# Patient Record
Sex: Female | Born: 1960 | Race: White | Hispanic: No | Marital: Married | Smoking: Never smoker
Health system: Southern US, Community
[De-identification: ages and names within clinical notes are randomized; demographics above are authoritative.]

## PROBLEM LIST (undated history)

## (undated) DIAGNOSIS — M1990 Unspecified osteoarthritis, unspecified site: Secondary | ICD-10-CM

## (undated) DIAGNOSIS — T7840XA Allergy, unspecified, initial encounter: Secondary | ICD-10-CM

## (undated) DIAGNOSIS — F32A Depression, unspecified: Secondary | ICD-10-CM

## (undated) DIAGNOSIS — G4700 Insomnia, unspecified: Secondary | ICD-10-CM

## (undated) DIAGNOSIS — N882 Stricture and stenosis of cervix uteri: Secondary | ICD-10-CM

## (undated) DIAGNOSIS — F329 Major depressive disorder, single episode, unspecified: Secondary | ICD-10-CM

## (undated) DIAGNOSIS — R450 Nervousness: Secondary | ICD-10-CM

## (undated) DIAGNOSIS — D1800 Hemangioma unspecified site: Secondary | ICD-10-CM

## (undated) DIAGNOSIS — E785 Hyperlipidemia, unspecified: Secondary | ICD-10-CM

## (undated) DIAGNOSIS — F419 Anxiety disorder, unspecified: Secondary | ICD-10-CM

## (undated) DIAGNOSIS — R87619 Unspecified abnormal cytological findings in specimens from cervix uteri: Secondary | ICD-10-CM

## (undated) DIAGNOSIS — C50919 Malignant neoplasm of unspecified site of unspecified female breast: Secondary | ICD-10-CM

## (undated) DIAGNOSIS — R42 Dizziness and giddiness: Secondary | ICD-10-CM

## (undated) HISTORY — DX: Unspecified abnormal cytological findings in specimens from cervix uteri: R87.619

## (undated) HISTORY — DX: Insomnia, unspecified: G47.00

## (undated) HISTORY — PX: COLONOSCOPY: SHX174

## (undated) HISTORY — PX: TUBAL LIGATION: SHX77

## (undated) HISTORY — DX: Unspecified osteoarthritis, unspecified site: M19.90

## (undated) HISTORY — DX: Dizziness and giddiness: R42

## (undated) HISTORY — DX: Hyperlipidemia, unspecified: E78.5

## (undated) HISTORY — PX: BREAST REDUCTION SURGERY: SHX8

## (undated) HISTORY — DX: Nervousness: R45.0

## (undated) HISTORY — DX: Hemangioma unspecified site: D18.00

## (undated) HISTORY — DX: Stricture and stenosis of cervix uteri: N88.2

## (undated) HISTORY — PX: OTHER SURGICAL HISTORY: SHX169

---

## 1985-03-10 DIAGNOSIS — R450 Nervousness: Secondary | ICD-10-CM

## 1985-03-10 HISTORY — DX: Nervousness: R45.0

## 1989-03-10 DIAGNOSIS — R42 Dizziness and giddiness: Secondary | ICD-10-CM

## 1989-03-10 HISTORY — DX: Dizziness and giddiness: R42

## 1995-03-11 DIAGNOSIS — G4700 Insomnia, unspecified: Secondary | ICD-10-CM

## 1995-03-11 HISTORY — DX: Insomnia, unspecified: G47.00

## 1998-02-27 ENCOUNTER — Encounter: Admission: RE | Admit: 1998-02-27 | Discharge: 1998-05-28 | Payer: Self-pay | Admitting: *Deleted

## 1998-06-25 ENCOUNTER — Other Ambulatory Visit: Admission: RE | Admit: 1998-06-25 | Discharge: 1998-06-25 | Payer: Self-pay | Admitting: Obstetrics & Gynecology

## 1999-02-01 ENCOUNTER — Encounter: Admission: RE | Admit: 1999-02-01 | Discharge: 1999-02-01 | Payer: Self-pay | Admitting: Obstetrics & Gynecology

## 1999-02-01 ENCOUNTER — Encounter: Payer: Self-pay | Admitting: Obstetrics and Gynecology

## 1999-06-24 ENCOUNTER — Ambulatory Visit (HOSPITAL_COMMUNITY): Admission: RE | Admit: 1999-06-24 | Discharge: 1999-06-24 | Payer: Self-pay | Admitting: Family Medicine

## 1999-06-24 ENCOUNTER — Encounter: Payer: Self-pay | Admitting: Family Medicine

## 1999-07-22 ENCOUNTER — Other Ambulatory Visit: Admission: RE | Admit: 1999-07-22 | Discharge: 1999-07-22 | Payer: Self-pay | Admitting: Obstetrics and Gynecology

## 2000-03-10 DIAGNOSIS — N882 Stricture and stenosis of cervix uteri: Secondary | ICD-10-CM

## 2000-03-10 DIAGNOSIS — R87619 Unspecified abnormal cytological findings in specimens from cervix uteri: Secondary | ICD-10-CM

## 2000-03-10 HISTORY — DX: Stricture and stenosis of cervix uteri: N88.2

## 2000-03-10 HISTORY — DX: Unspecified abnormal cytological findings in specimens from cervix uteri: R87.619

## 2000-04-24 ENCOUNTER — Ambulatory Visit (HOSPITAL_COMMUNITY): Admission: RE | Admit: 2000-04-24 | Discharge: 2000-04-24 | Payer: Self-pay | Admitting: Neurology

## 2000-04-24 ENCOUNTER — Encounter: Payer: Self-pay | Admitting: Neurology

## 2000-09-09 ENCOUNTER — Other Ambulatory Visit: Admission: RE | Admit: 2000-09-09 | Discharge: 2000-09-09 | Payer: Self-pay | Admitting: Obstetrics and Gynecology

## 2000-09-25 ENCOUNTER — Encounter (INDEPENDENT_AMBULATORY_CARE_PROVIDER_SITE_OTHER): Payer: Self-pay

## 2000-09-25 ENCOUNTER — Other Ambulatory Visit: Admission: RE | Admit: 2000-09-25 | Discharge: 2000-09-25 | Payer: Self-pay | Admitting: Obstetrics and Gynecology

## 2000-10-01 ENCOUNTER — Ambulatory Visit (HOSPITAL_COMMUNITY): Admission: RE | Admit: 2000-10-01 | Discharge: 2000-10-01 | Payer: Self-pay | Admitting: Obstetrics and Gynecology

## 2000-10-01 ENCOUNTER — Encounter (INDEPENDENT_AMBULATORY_CARE_PROVIDER_SITE_OTHER): Payer: Self-pay | Admitting: Specialist

## 2001-04-06 ENCOUNTER — Other Ambulatory Visit: Admission: RE | Admit: 2001-04-06 | Discharge: 2001-04-06 | Payer: Self-pay | Admitting: Obstetrics and Gynecology

## 2001-06-18 ENCOUNTER — Encounter: Admission: RE | Admit: 2001-06-18 | Discharge: 2001-09-16 | Payer: Self-pay | Admitting: Neurology

## 2001-09-13 ENCOUNTER — Other Ambulatory Visit: Admission: RE | Admit: 2001-09-13 | Discharge: 2001-09-13 | Payer: Self-pay | Admitting: Obstetrics and Gynecology

## 2002-03-11 ENCOUNTER — Other Ambulatory Visit: Admission: RE | Admit: 2002-03-11 | Discharge: 2002-03-11 | Payer: Self-pay | Admitting: Obstetrics and Gynecology

## 2002-09-15 ENCOUNTER — Other Ambulatory Visit: Admission: RE | Admit: 2002-09-15 | Discharge: 2002-09-15 | Payer: Self-pay | Admitting: Obstetrics and Gynecology

## 2003-01-13 ENCOUNTER — Encounter: Admission: RE | Admit: 2003-01-13 | Discharge: 2003-01-13 | Payer: Self-pay | Admitting: Obstetrics and Gynecology

## 2003-03-20 ENCOUNTER — Other Ambulatory Visit: Admission: RE | Admit: 2003-03-20 | Discharge: 2003-03-20 | Payer: Self-pay | Admitting: Obstetrics and Gynecology

## 2003-11-20 ENCOUNTER — Other Ambulatory Visit: Admission: RE | Admit: 2003-11-20 | Discharge: 2003-11-20 | Payer: Self-pay | Admitting: Obstetrics and Gynecology

## 2004-03-19 ENCOUNTER — Inpatient Hospital Stay (HOSPITAL_COMMUNITY): Admission: AD | Admit: 2004-03-19 | Discharge: 2004-03-19 | Payer: Self-pay | Admitting: Obstetrics and Gynecology

## 2004-03-25 ENCOUNTER — Ambulatory Visit (HOSPITAL_COMMUNITY): Admission: RE | Admit: 2004-03-25 | Discharge: 2004-03-25 | Payer: Self-pay | Admitting: Obstetrics and Gynecology

## 2004-05-07 ENCOUNTER — Inpatient Hospital Stay (HOSPITAL_COMMUNITY): Admission: AD | Admit: 2004-05-07 | Discharge: 2004-05-10 | Payer: Self-pay | Admitting: Obstetrics and Gynecology

## 2004-05-08 ENCOUNTER — Encounter (INDEPENDENT_AMBULATORY_CARE_PROVIDER_SITE_OTHER): Payer: Self-pay | Admitting: *Deleted

## 2004-05-08 HISTORY — PX: OTHER SURGICAL HISTORY: SHX169

## 2004-06-18 ENCOUNTER — Other Ambulatory Visit: Admission: RE | Admit: 2004-06-18 | Discharge: 2004-06-18 | Payer: Self-pay | Admitting: Obstetrics and Gynecology

## 2004-08-21 ENCOUNTER — Encounter: Admission: RE | Admit: 2004-08-21 | Discharge: 2004-08-21 | Payer: Self-pay | Admitting: Obstetrics and Gynecology

## 2005-03-10 DIAGNOSIS — E785 Hyperlipidemia, unspecified: Secondary | ICD-10-CM

## 2005-03-10 HISTORY — DX: Hyperlipidemia, unspecified: E78.5

## 2005-06-19 ENCOUNTER — Other Ambulatory Visit: Admission: RE | Admit: 2005-06-19 | Discharge: 2005-06-19 | Payer: Self-pay | Admitting: Obstetrics and Gynecology

## 2005-09-05 ENCOUNTER — Encounter: Admission: RE | Admit: 2005-09-05 | Discharge: 2005-09-05 | Payer: Self-pay | Admitting: Obstetrics and Gynecology

## 2006-02-07 HISTORY — PX: OTHER SURGICAL HISTORY: SHX169

## 2006-03-04 ENCOUNTER — Ambulatory Visit (HOSPITAL_COMMUNITY): Admission: RE | Admit: 2006-03-04 | Discharge: 2006-03-04 | Payer: Self-pay | Admitting: Obstetrics and Gynecology

## 2006-10-09 ENCOUNTER — Encounter: Admission: RE | Admit: 2006-10-09 | Discharge: 2006-10-09 | Payer: Self-pay | Admitting: Obstetrics and Gynecology

## 2007-11-19 ENCOUNTER — Encounter: Admission: RE | Admit: 2007-11-19 | Discharge: 2007-11-19 | Payer: Self-pay | Admitting: Obstetrics and Gynecology

## 2007-11-22 ENCOUNTER — Encounter: Admission: RE | Admit: 2007-11-22 | Discharge: 2007-11-22 | Payer: Self-pay | Admitting: Obstetrics and Gynecology

## 2008-03-15 ENCOUNTER — Emergency Department (HOSPITAL_COMMUNITY): Admission: EM | Admit: 2008-03-15 | Discharge: 2008-03-15 | Payer: Self-pay | Admitting: Emergency Medicine

## 2008-04-29 ENCOUNTER — Ambulatory Visit: Payer: Self-pay | Admitting: Cardiology

## 2008-05-15 ENCOUNTER — Encounter: Admission: RE | Admit: 2008-05-15 | Discharge: 2008-08-13 | Payer: Self-pay | Admitting: Neurology

## 2009-01-19 ENCOUNTER — Encounter: Admission: RE | Admit: 2009-01-19 | Discharge: 2009-01-19 | Payer: Self-pay | Admitting: Obstetrics and Gynecology

## 2010-01-22 ENCOUNTER — Encounter: Admission: RE | Admit: 2010-01-22 | Discharge: 2010-01-22 | Payer: Self-pay | Admitting: Obstetrics and Gynecology

## 2010-03-10 DIAGNOSIS — T7840XA Allergy, unspecified, initial encounter: Secondary | ICD-10-CM

## 2010-03-10 HISTORY — DX: Allergy, unspecified, initial encounter: T78.40XA

## 2010-04-01 ENCOUNTER — Encounter: Payer: Self-pay | Admitting: Obstetrics and Gynecology

## 2010-05-23 ENCOUNTER — Other Ambulatory Visit: Payer: Self-pay | Admitting: Dermatology

## 2010-06-24 LAB — POCT I-STAT, CHEM 8
BUN: 14 mg/dL (ref 6–23)
Calcium, Ion: 1.1 mmol/L — ABNORMAL LOW (ref 1.12–1.32)
Creatinine, Ser: 0.6 mg/dL (ref 0.4–1.2)
Glucose, Bld: 87 mg/dL (ref 70–99)
TCO2: 25 mmol/L (ref 0–100)

## 2010-07-12 ENCOUNTER — Encounter: Payer: Self-pay | Admitting: Family Medicine

## 2010-07-26 NOTE — Op Note (Signed)
NAMEKENEDIE, DIROCCO              ACCOUNT NO.:  [id number]   MEDICAL RECORD NO.:  [id number]          PATIENT TYPE:  AMB   LOCATION:  SDC                           FACILITY:  [facility]   PHYSICIAN:  Zenaida Niece, M.D.DATE OF BIRTH:  [birth year]   DATE OF PROCEDURE:  03/04/2006  DATE OF DISCHARGE:                               OPERATIVE REPORT   PREOPERATIVE DIAGNOSES:  Menorrhagia.   POSTOPERATIVE DIAGNOSES:  Menorrhagia.   PROCEDURE PERFORMED:  NovaSure endometrial ablation.   SURGEON:  Zenaida Niece, M.D.   ANESTHESIA:  General anesthesia with an LMA and paracervical block.   SPECIMENS:  None.   ESTIMATED BLOOD LOSS:  Minimal.   COMPLICATIONS:  None.   FINDINGS:  The uterus measured to 8 cm and cervix measured to 4 cm,  giving a uterine depth of 4 cm and the uterine width was 4.5 cm with the  NovaSure device.  The device used 90 watts of power for 107 seconds.   DESCRIPTION OF PROCEDURE:  The patient was taken to the operating room  and placed in the dorsal supine position.  General anesthesia was  induced and she was placed in mobile stirrups.  The perineum and vagina  were then prepped and draped in the usual sterile fashion and the  bladder drained with a red rubber catheter.  A Graves speculum was  inserted into the vagina and 2 mL of 2% plain lidocaine were infiltrated  at 12 o'clock in the cervix for the single-tooth tenaculum.  Preoperatively she was given IV Toradol.  The anterior lip of the cervix  was then grasped with a single-tooth tenaculum.  A deep paracervical  block was then performed with a total of 16 mL of 2% plain lidocaine.  The uterus then sounded to 8 cm and then with a size seven Hegar  dilator, the cervix measured 4 cm.  The cervix easily dilated to  accommodate the size seven and then the size eight Hegar dilator.  The  NovaSure device was then easily inserted and deployed appropriately.  The CO2 test was passed and endometrial ablation was  performed with the  above-mentioned settings without complications.  At the end of procedure  the device was removed and found to be intact.  The single-tooth  tenaculum was removed and bleeding controlled with pressure.  All  instruments were then removed from the vagina.  The patient tolerated  the procedure well and was taken to the recovery room in stable  condition.  Counts were correct.     Zenaida Niece, M.D.  Electronically Signed    TDM/MEDQ  D:  03/04/2006  T:  03/04/2006  Job:  [id number]

## 2010-07-26 NOTE — Op Note (Signed)
[name], ARCINIEGA NO.:  [id number]   MEDICAL RECORD NO.:  [id number]          PATIENT TYPE:  INP   LOCATION:  9124                          FACILITY:  [facility]   PHYSICIAN:  Zenaida Niece, M.D.DATE OF BIRTH:  1960/05/10   DATE OF PROCEDURE:  05/08/2004  DATE OF DISCHARGE:                                 OPERATIVE REPORT   PREOPERATIVE DIAGNOSIS:  Multiparity and desires surgical sterility.   POSTOPERATIVE DIAGNOSES:  Multiparity and desires surgical sterility.   PROCEDURES:  Bilateral partial salpingectomy.   SURGEON:  Zenaida Niece, M.D.   ANESTHESIA:  Epidural.   ESTIMATED BLOOD LOSS:  Less than 50 mL.   FINDINGS:  The patient had normal anatomy.   SPECIMENS:  Portions of bilateral fallopian tubes.   PROCEDURE IN DETAIL:  The patient was taken to the operating room and placed  in the dorsosupine position. Her previously placed epidural was dosed  appropriately. Abdomen was prepped and draped in the usual sterile fashion.  Her infraumbilical skin was then infiltrated with 0.25% Marcaine once level  of her anesthesia was adequate. A 3 cm horizontal incision was made and  carried down to the fascia. Fascia was elevated and entered sharply.  Peritoneum was then elevated and also entered sharply. The patient was  placed in Trendelenburg and bowels packed above the uterus. Both fallopian  tubes were identified and traced to their fimbriated ends. A knuckle of tube  on each side was elevated with a Babcock clamp. This knuckle of tube was  ligated with zero plain gut suture on both sides. The knuckle of tube was  then removed sharply. On both sides, both ostia were identified and the  stumps were hemostatic. The packing was then removed from the abdomen. The  fascia was closed with running 0 Vicryl. Skin was closed with a running  subcuticular suture of 4-0 Vicryl followed by bandage. The patient tolerated  the procedure well and was taken to recovery in  stable condition. Counts  were correct.      TDM/MEDQ  D:  05/08/2004  T:  05/08/2004  Job:  [id number]
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
## 2010-12-31 ENCOUNTER — Other Ambulatory Visit: Payer: Self-pay | Admitting: Obstetrics and Gynecology

## 2010-12-31 DIAGNOSIS — Z1231 Encounter for screening mammogram for malignant neoplasm of breast: Secondary | ICD-10-CM

## 2011-01-28 ENCOUNTER — Ambulatory Visit
Admission: RE | Admit: 2011-01-28 | Discharge: 2011-01-28 | Disposition: A | Discharge status: Home / Self Care | Payer: BC Managed Care – PPO | Source: Ambulatory Visit | Attending: Obstetrics and Gynecology | Admitting: Obstetrics and Gynecology

## 2011-01-28 DIAGNOSIS — Z1231 Encounter for screening mammogram for malignant neoplasm of breast: Secondary | ICD-10-CM

## 2011-01-31 ENCOUNTER — Other Ambulatory Visit: Payer: Self-pay | Admitting: Obstetrics and Gynecology

## 2011-01-31 DIAGNOSIS — R928 Other abnormal and inconclusive findings on diagnostic imaging of breast: Secondary | ICD-10-CM

## 2011-02-03 ENCOUNTER — Ambulatory Visit
Admission: RE | Admit: 2011-02-03 | Discharge: 2011-02-03 | Disposition: A | Discharge status: Home / Self Care | Payer: BC Managed Care – PPO | Source: Ambulatory Visit | Attending: Obstetrics and Gynecology | Admitting: Obstetrics and Gynecology

## 2011-02-03 ENCOUNTER — Other Ambulatory Visit: Payer: Self-pay | Admitting: Obstetrics and Gynecology

## 2011-02-03 DIAGNOSIS — R928 Other abnormal and inconclusive findings on diagnostic imaging of breast: Secondary | ICD-10-CM

## 2011-02-03 DIAGNOSIS — N6001 Solitary cyst of right breast: Secondary | ICD-10-CM

## 2011-06-23 ENCOUNTER — Ambulatory Visit: Payer: BC Managed Care – PPO | Admitting: Physical Therapy

## 2011-06-26 ENCOUNTER — Ambulatory Visit: Payer: BC Managed Care – PPO | Admitting: Physical Therapy

## 2011-06-27 ENCOUNTER — Encounter: Payer: BC Managed Care – PPO | Admitting: *Deleted

## 2011-06-30 ENCOUNTER — Ambulatory Visit: Payer: BC Managed Care – PPO | Attending: Orthopedic Surgery | Admitting: Physical Therapy

## 2011-06-30 DIAGNOSIS — R5381 Other malaise: Secondary | ICD-10-CM | POA: Insufficient documentation

## 2011-06-30 DIAGNOSIS — IMO0001 Reserved for inherently not codable concepts without codable children: Secondary | ICD-10-CM | POA: Insufficient documentation

## 2011-06-30 DIAGNOSIS — M25519 Pain in unspecified shoulder: Secondary | ICD-10-CM | POA: Insufficient documentation

## 2012-02-16 ENCOUNTER — Other Ambulatory Visit: Payer: Self-pay | Admitting: Obstetrics and Gynecology

## 2012-02-16 DIAGNOSIS — Z1231 Encounter for screening mammogram for malignant neoplasm of breast: Secondary | ICD-10-CM

## 2012-02-18 ENCOUNTER — Ambulatory Visit: Payer: BC Managed Care – PPO

## 2012-02-24 ENCOUNTER — Ambulatory Visit
Admission: RE | Admit: 2012-02-24 | Discharge: 2012-02-24 | Disposition: A | Discharge status: Home / Self Care | Payer: PRIVATE HEALTH INSURANCE | Source: Ambulatory Visit | Attending: Obstetrics and Gynecology | Admitting: Obstetrics and Gynecology

## 2012-02-24 DIAGNOSIS — Z1231 Encounter for screening mammogram for malignant neoplasm of breast: Secondary | ICD-10-CM

## 2012-04-28 ENCOUNTER — Other Ambulatory Visit: Payer: Self-pay | Admitting: Orthopedic Surgery

## 2012-04-28 DIAGNOSIS — M25511 Pain in right shoulder: Secondary | ICD-10-CM

## 2012-04-29 ENCOUNTER — Ambulatory Visit
Admission: RE | Admit: 2012-04-29 | Discharge: 2012-04-29 | Disposition: A | Discharge status: Home / Self Care | Payer: PRIVATE HEALTH INSURANCE | Source: Ambulatory Visit | Attending: Orthopedic Surgery | Admitting: Orthopedic Surgery

## 2012-04-29 DIAGNOSIS — M25511 Pain in right shoulder: Secondary | ICD-10-CM

## 2012-06-18 ENCOUNTER — Other Ambulatory Visit: Payer: Self-pay | Admitting: Family Medicine

## 2012-06-18 NOTE — Telephone Encounter (Signed)
Patient last seen in office on 10-01-11. Please advise. Thank you

## 2012-07-06 ENCOUNTER — Other Ambulatory Visit: Payer: Self-pay | Admitting: *Deleted

## 2012-07-06 MED ORDER — MECLIZINE HCL 12.5 MG PO TABS: 12.5000 mg | ORAL_TABLET | Freq: Three times a day (TID) | ORAL | Status: DC | PRN

## 2012-07-06 NOTE — Telephone Encounter (Signed)
Patient last seen in office on 10-01-11. Please advise. Thank you 

## 2012-07-20 ENCOUNTER — Encounter (HOSPITAL_COMMUNITY): Payer: Self-pay | Admitting: Pharmacy Technician

## 2012-07-21 NOTE — Progress Notes (Signed)
Dr Darrelyn Hillock-  Need PRE OP ORDERS PLEASE-  Has PST appt 07/22/12  Thanks

## 2012-07-22 ENCOUNTER — Other Ambulatory Visit (HOSPITAL_COMMUNITY): Payer: Self-pay | Admitting: *Deleted

## 2012-07-22 ENCOUNTER — Inpatient Hospital Stay (HOSPITAL_COMMUNITY): Admission: RE | Admit: 2012-07-22 | Payer: PRIVATE HEALTH INSURANCE | Source: Ambulatory Visit

## 2012-07-23 ENCOUNTER — Other Ambulatory Visit: Payer: Self-pay | Admitting: Family Medicine

## 2012-07-23 ENCOUNTER — Inpatient Hospital Stay (HOSPITAL_COMMUNITY): Admission: RE | Admit: 2012-07-23 | Payer: PRIVATE HEALTH INSURANCE | Source: Ambulatory Visit

## 2012-07-23 NOTE — Patient Instructions (Addendum)
20 MOE BRIER  07/23/2012   Your procedure is scheduled on: 07-28-2012  Report to [facility] at 845 AM.  Call this number if you have problems the morning of surgery [phone]   Remember:   Do not eat food or drink liquids :After Midnight.   Take these medicines the morning of surgery with A SIP OF WATER: clonazepam                                SEE [place] PREPARING FOR SURGERY SHEET   Do not wear jewelry, make-up or nail polish.  Do not wear lotions, powders, or perfumes. You may wear deodorant.   Men may shave face and neck.  Do not bring valuables to the hospital.  Contacts, dentures or bridgework may not be worn into surgery.  Leave suitcase in the car. After surgery it may be brought to your room.  For patients admitted to the hospital, checkout time is 11:00 AM the day of discharge.   Patients discharged the day of surgery will not be allowed to drive home.  Name and phone number of your driver:  Special Instructions: N/A   Please read over the following fact sheets that you were given: MRSA Information.  Call Cain Sieve RN pre op nurse if needed [phone]    FAILURE TO FOLLOW THESE INSTRUCTIONS MAY RESULT IN THE CANCELLATION OF YOUR SURGERY. PATIENT SIGNATURE___________________________________________
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
## 2012-07-26 ENCOUNTER — Encounter (HOSPITAL_COMMUNITY)
Admission: RE | Admit: 2012-07-26 | Discharge: 2012-07-26 | Disposition: A | Discharge status: Home / Self Care | Payer: PRIVATE HEALTH INSURANCE | Source: Ambulatory Visit | Attending: Orthopedic Surgery | Admitting: Orthopedic Surgery

## 2012-07-26 ENCOUNTER — Encounter (HOSPITAL_COMMUNITY): Payer: Self-pay

## 2012-07-26 LAB — COMPREHENSIVE METABOLIC PANEL
ALT: 25 U/L (ref 0–35)
Albumin: 3.7 g/dL (ref 3.5–5.2)
Alkaline Phosphatase: 42 U/L (ref 39–117)
BUN: 12 mg/dL (ref 6–23)
Chloride: 102 mEq/L (ref 96–112)
GFR calc Af Amer: >90 mL/min (ref >90)
Glucose, Bld: 79 mg/dL (ref 70–99)
Potassium: 3.5 mEq/L (ref 3.5–5.1)
Sodium: 141 mEq/L (ref 135–145)
Total Bilirubin: 0.4 mg/dL (ref 0.3–1.2)
Total Protein: 6.5 g/dL (ref 6.0–8.3)

## 2012-07-26 LAB — PROTIME-INR: Prothrombin Time: 12.1 seconds (ref 11.6–15.2)

## 2012-07-26 LAB — SURGICAL PCR SCREEN
MRSA, PCR: NEGATIVE
Staphylococcus aureus: POSITIVE — AB

## 2012-07-26 LAB — CBC
HCT: 40.4 % (ref 36.0–46.0)
Hemoglobin: 13.2 g/dL (ref 12.0–15.0)
WBC: 11.3 10*3/uL — ABNORMAL HIGH (ref 4.0–10.5)

## 2012-07-26 LAB — URINALYSIS, ROUTINE W REFLEX MICROSCOPIC
Glucose, UA: NEGATIVE mg/dL
Hgb urine dipstick: NEGATIVE
Leukocytes, UA: NEGATIVE
Protein, ur: NEGATIVE mg/dL
pH: 7.5 (ref 5.0–8.0)

## 2012-07-26 LAB — HCG, SERUM, QUALITATIVE: Preg, Serum: NEGATIVE

## 2012-07-26 NOTE — Telephone Encounter (Signed)
Per our records last filled 12/29/11, last 10/01/11

## 2012-07-28 ENCOUNTER — Encounter (HOSPITAL_COMMUNITY)
Admission: RE | Disposition: A | Discharge status: Home / Self Care | Payer: Self-pay | Source: Ambulatory Visit | Attending: Orthopedic Surgery

## 2012-07-28 ENCOUNTER — Observation Stay (HOSPITAL_COMMUNITY)
Admission: RE | Admit: 2012-07-28 | Discharge: 2012-07-29 | Disposition: A | Discharge status: Home / Self Care | Payer: PRIVATE HEALTH INSURANCE | Source: Ambulatory Visit | Attending: Orthopedic Surgery | Admitting: Orthopedic Surgery

## 2012-07-28 ENCOUNTER — Encounter (HOSPITAL_COMMUNITY): Payer: Self-pay | Admitting: *Deleted

## 2012-07-28 ENCOUNTER — Ambulatory Visit (HOSPITAL_COMMUNITY): Payer: PRIVATE HEALTH INSURANCE | Admitting: Anesthesiology

## 2012-07-28 ENCOUNTER — Encounter (HOSPITAL_COMMUNITY): Payer: Self-pay | Admitting: Anesthesiology

## 2012-07-28 DIAGNOSIS — E785 Hyperlipidemia, unspecified: Secondary | ICD-10-CM | POA: Insufficient documentation

## 2012-07-28 DIAGNOSIS — M719 Bursopathy, unspecified: Secondary | ICD-10-CM | POA: Insufficient documentation

## 2012-07-28 DIAGNOSIS — S43429A Sprain of unspecified rotator cuff capsule, initial encounter: Secondary | ICD-10-CM

## 2012-07-28 DIAGNOSIS — Z79899 Other long term (current) drug therapy: Secondary | ICD-10-CM | POA: Insufficient documentation

## 2012-07-28 DIAGNOSIS — Z9889 Other specified postprocedural states: Secondary | ICD-10-CM

## 2012-07-28 DIAGNOSIS — Z01810 Encounter for preprocedural cardiovascular examination: Secondary | ICD-10-CM | POA: Insufficient documentation

## 2012-07-28 DIAGNOSIS — M25819 Other specified joint disorders, unspecified shoulder: Secondary | ICD-10-CM | POA: Insufficient documentation

## 2012-07-28 DIAGNOSIS — M7530 Calcific tendinitis of unspecified shoulder: Principal | ICD-10-CM | POA: Insufficient documentation

## 2012-07-28 DIAGNOSIS — Z01812 Encounter for preprocedural laboratory examination: Secondary | ICD-10-CM | POA: Insufficient documentation

## 2012-07-28 DIAGNOSIS — M67919 Unspecified disorder of synovium and tendon, unspecified shoulder: Secondary | ICD-10-CM | POA: Insufficient documentation

## 2012-07-28 HISTORY — PX: SHOULDER OPEN ROTATOR CUFF REPAIR: SHX2407

## 2012-07-28 SURGERY — REPAIR, ROTATOR CUFF, OPEN
Anesthesia: General | Site: Shoulder | Laterality: Right | Wound class: Clean

## 2012-07-28 MED ORDER — ONDANSETRON HCL 4 MG/2ML IJ SOLN
INTRAMUSCULAR | Status: DC | PRN
  Administered 2012-07-28: 4 mg via INTRAVENOUS
  Administered 2012-07-28: 4 mg

## 2012-07-28 MED ORDER — LACTATED RINGERS IV SOLN
INTRAVENOUS | Status: DC
  Administered 2012-07-28 – 2012-07-29 (×2): via INTRAVENOUS

## 2012-07-28 MED ORDER — SUCCINYLCHOLINE CHLORIDE 20 MG/ML IJ SOLN
INTRAMUSCULAR | Status: DC | PRN
  Administered 2012-07-28: 100 mg via INTRAVENOUS

## 2012-07-28 MED ORDER — SODIUM CHLORIDE 0.9 % IR SOLN
Status: DC | PRN
  Administered 2012-07-28: 12:00:00

## 2012-07-28 MED ORDER — LIDOCAINE HCL (CARDIAC) 20 MG/ML IV SOLN
INTRAVENOUS | Status: DC | PRN
  Administered 2012-07-28: 50 mg via INTRAVENOUS

## 2012-07-28 MED ORDER — CEFAZOLIN SODIUM 1-5 GM-% IV SOLN
1.0000 g | Freq: Four times a day (QID) | INTRAVENOUS | Status: DC
  Administered 2012-07-28 – 2012-07-29 (×2): 1 g via INTRAVENOUS
  Filled 2012-07-28 (×3): qty 50

## 2012-07-28 MED ORDER — KETOROLAC TROMETHAMINE 30 MG/ML IJ SOLN
INTRAMUSCULAR | Status: DC | PRN
  Administered 2012-07-28: 30 mg via INTRAVENOUS

## 2012-07-28 MED ORDER — CLONAZEPAM 0.5 MG PO TABS: 0.5000 mg | ORAL_TABLET | Freq: Every day | ORAL | Status: DC

## 2012-07-28 MED ORDER — ACETAMINOPHEN 10 MG/ML IV SOLN
INTRAVENOUS | Status: DC | PRN
  Administered 2012-07-28: 1000 mg via INTRAVENOUS

## 2012-07-28 MED ORDER — ONDANSETRON HCL 4 MG PO TABS: 4.0000 mg | ORAL_TABLET | Freq: Four times a day (QID) | ORAL | Status: DC | PRN

## 2012-07-28 MED ORDER — FLEET ENEMA 7-19 GM/118ML RE ENEM: 1.0000 | ENEMA | Freq: Once | RECTAL | Status: AC | PRN

## 2012-07-28 MED ORDER — LACTATED RINGERS IV SOLN: INTRAVENOUS | Status: DC

## 2012-07-28 MED ORDER — OXYCODONE-ACETAMINOPHEN 5-325 MG PO TABS
1.0000 | ORAL_TABLET | ORAL | Status: DC | PRN
  Administered 2012-07-29: 2 via ORAL
  Filled 2012-07-28: qty 2

## 2012-07-28 MED ORDER — PROMETHAZINE HCL 25 MG/ML IJ SOLN: 6.2500 mg | INTRAMUSCULAR | Status: DC | PRN

## 2012-07-28 MED ORDER — PHENOL 1.4 % MT LIQD: 1.0000 | OROMUCOSAL | Status: DC | PRN

## 2012-07-28 MED ORDER — HYDROMORPHONE HCL PF 1 MG/ML IJ SOLN
0.2500 mg | INTRAMUSCULAR | Status: DC | PRN
  Administered 2012-07-28 (×2): 0.5 mg via INTRAVENOUS

## 2012-07-28 MED ORDER — MIDAZOLAM HCL 5 MG/5ML IJ SOLN
INTRAMUSCULAR | Status: DC | PRN
  Administered 2012-07-28: 2 mg via INTRAVENOUS

## 2012-07-28 MED ORDER — PAROXETINE HCL ER 25 MG PO TB24
25.0000 mg | ORAL_TABLET | Freq: Every day | ORAL | Status: DC
  Filled 2012-07-28: qty 1

## 2012-07-28 MED ORDER — THROMBIN 5000 UNITS EX SOLR
CUTANEOUS | Status: DC | PRN
  Administered 2012-07-28: 10000 [IU] via TOPICAL

## 2012-07-28 MED ORDER — ONDANSETRON HCL 4 MG/2ML IJ SOLN
4.0000 mg | Freq: Four times a day (QID) | INTRAMUSCULAR | Status: DC | PRN
  Filled 2012-07-28: qty 2

## 2012-07-28 MED ORDER — BISACODYL 10 MG RE SUPP: 10.0000 mg | Freq: Every day | RECTAL | Status: DC | PRN

## 2012-07-28 MED ORDER — METOCLOPRAMIDE HCL 5 MG/ML IJ SOLN
INTRAMUSCULAR | Status: DC | PRN
  Administered 2012-07-28: 10 mg via INTRAVENOUS

## 2012-07-28 MED ORDER — SUFENTANIL CITRATE 50 MCG/ML IV SOLN
INTRAVENOUS | Status: DC | PRN
  Administered 2012-07-28: 10 ug via INTRAVENOUS
  Administered 2012-07-28 (×3): 5 ug via INTRAVENOUS
  Administered 2012-07-28 (×2): 10 ug via INTRAVENOUS
  Administered 2012-07-28: 5 ug via INTRAVENOUS

## 2012-07-28 MED ORDER — MENTHOL 3 MG MT LOZG: 1.0000 | LOZENGE | OROMUCOSAL | Status: DC | PRN

## 2012-07-28 MED ORDER — HYDROCODONE-ACETAMINOPHEN 5-325 MG PO TABS
1.0000 | ORAL_TABLET | ORAL | Status: DC | PRN
  Administered 2012-07-28 – 2012-07-29 (×3): 2 via ORAL
  Filled 2012-07-28 (×3): qty 2

## 2012-07-28 MED ORDER — MUPIROCIN 2 % EX OINT
TOPICAL_OINTMENT | Freq: Two times a day (BID) | CUTANEOUS | Status: DC
  Administered 2012-07-28: via NASAL
  Filled 2012-07-28: qty 22

## 2012-07-28 MED ORDER — METHOCARBAMOL 100 MG/ML IJ SOLN
500.0000 mg | Freq: Four times a day (QID) | INTRAVENOUS | Status: DC | PRN
  Filled 2012-07-28: qty 5

## 2012-07-28 MED ORDER — CEFAZOLIN SODIUM-DEXTROSE 2-3 GM-% IV SOLR: 2.0000 g | INTRAVENOUS | Status: DC

## 2012-07-28 MED ORDER — ACETAMINOPHEN 650 MG RE SUPP: 650.0000 mg | Freq: Four times a day (QID) | RECTAL | Status: DC | PRN

## 2012-07-28 MED ORDER — LIDOCAINE HCL 4 % MT SOLN
OROMUCOSAL | Status: DC | PRN
  Administered 2012-07-28: 4 mL via TOPICAL

## 2012-07-28 MED ORDER — PROPOFOL 10 MG/ML IV BOLUS
INTRAVENOUS | Status: DC | PRN
  Administered 2012-07-28: 170 mg via INTRAVENOUS

## 2012-07-28 MED ORDER — LACTATED RINGERS IV SOLN
INTRAVENOUS | Status: DC
  Administered 2012-07-28: 1000 mL via INTRAVENOUS
  Administered 2012-07-28: 13:00:00 via INTRAVENOUS

## 2012-07-28 MED ORDER — HYDROMORPHONE HCL PF 1 MG/ML IJ SOLN
INTRAMUSCULAR | Status: DC | PRN
  Administered 2012-07-28 (×2): 1 mg via INTRAVENOUS

## 2012-07-28 MED ORDER — ACETAMINOPHEN 325 MG PO TABS: 650.0000 mg | ORAL_TABLET | Freq: Four times a day (QID) | ORAL | Status: DC | PRN

## 2012-07-28 MED ORDER — SODIUM CHLORIDE 0.9 % IJ SOLN: INTRAMUSCULAR | Status: DC | PRN

## 2012-07-28 MED ORDER — ROCURONIUM BROMIDE 100 MG/10ML IV SOLN
INTRAVENOUS | Status: DC | PRN
  Administered 2012-07-28: 5 mg via INTRAVENOUS

## 2012-07-28 MED ORDER — OXYCODONE-ACETAMINOPHEN 5-325 MG PO TABS: 1.0000 | ORAL_TABLET | ORAL | Status: DC | PRN

## 2012-07-28 MED ORDER — DEXAMETHASONE SODIUM PHOSPHATE 4 MG/ML IJ SOLN
INTRAMUSCULAR | Status: DC | PRN
  Administered 2012-07-28: 4 mg via INTRAVENOUS

## 2012-07-28 MED ORDER — MIDAZOLAM HCL 2 MG/2ML IJ SOLN
1.0000 mg | INTRAMUSCULAR | Status: DC | PRN
  Administered 2012-07-28 (×2): 1 mg via INTRAVENOUS

## 2012-07-28 MED ORDER — BUPIVACAINE LIPOSOME 1.3 % IJ SUSP
20.0000 mL | Freq: Once | INTRAMUSCULAR | Status: DC
  Filled 2012-07-28: qty 20

## 2012-07-28 MED ORDER — POLYETHYLENE GLYCOL 3350 17 G PO PACK
17.0000 g | PACK | Freq: Every day | ORAL | Status: DC | PRN
  Filled 2012-07-28: qty 1

## 2012-07-28 MED ORDER — BUPIVACAINE LIPOSOME 1.3 % IJ SUSP
INTRAMUSCULAR | Status: DC | PRN
  Administered 2012-07-28: 20 mL

## 2012-07-28 MED ORDER — METHOCARBAMOL 500 MG PO TABS: 500.0000 mg | ORAL_TABLET | Freq: Four times a day (QID) | ORAL | Status: DC | PRN

## 2012-07-28 MED ORDER — PHENYLEPHRINE HCL 10 MG/ML IJ SOLN
INTRAMUSCULAR | Status: DC | PRN
  Administered 2012-07-28: 40 ug via INTRAVENOUS

## 2012-07-28 MED ORDER — HYDROMORPHONE HCL PF 1 MG/ML IJ SOLN
0.5000 mg | INTRAMUSCULAR | Status: DC | PRN
  Administered 2012-07-28 – 2012-07-29 (×4): 1 mg via INTRAVENOUS
  Filled 2012-07-28 (×4): qty 1

## 2012-07-28 MED ORDER — METHOCARBAMOL 500 MG PO TABS
500.0000 mg | ORAL_TABLET | Freq: Four times a day (QID) | ORAL | Status: DC | PRN
  Administered 2012-07-29: 500 mg via ORAL
  Filled 2012-07-28: qty 1

## 2012-07-28 SURGICAL SUPPLY — 43 items
ANCHOR PEEK ZIP 5.5 NDL NO2 (Orthopedic Implant) ×2 IMPLANT
BAG ZIPLOCK 12X15 (MISCELLANEOUS) ×2 IMPLANT
BLADE OSCILLATING/SAGITTAL (BLADE) ×1
BLADE SW THK.38XMED LNG THN (BLADE) ×1 IMPLANT
BNDG COHESIVE 6X5 TAN NS LF (GAUZE/BANDAGES/DRESSINGS) IMPLANT
BUR OVAL CARBIDE 4.0 (BURR) ×2 IMPLANT
CLEANER TIP ELECTROSURG 2X2 (MISCELLANEOUS) ×2 IMPLANT
CLOTH BEACON ORANGE TIMEOUT ST (SAFETY) ×2 IMPLANT
DRAPE POUCH INSTRU U-SHP 10X18 (DRAPES) ×2 IMPLANT
DRSG EMULSION OIL 3X3 NADH (GAUZE/BANDAGES/DRESSINGS) ×2 IMPLANT
DRSG PAD ABDOMINAL 8X10 ST (GAUZE/BANDAGES/DRESSINGS) ×4 IMPLANT
DURAPREP 26ML APPLICATOR (WOUND CARE) ×2 IMPLANT
ELECT REM PT RETURN 9FT ADLT (ELECTROSURGICAL) ×2
ELECTRODE REM PT RTRN 9FT ADLT (ELECTROSURGICAL) ×1 IMPLANT
GLOVE BIOGEL PI IND STRL 8 (GLOVE) ×1 IMPLANT
GLOVE BIOGEL PI INDICATOR 8 (GLOVE) ×1
GLOVE ECLIPSE 8.0 STRL XLNG CF (GLOVE) ×4 IMPLANT
GLOVE SURG SS PI 6.5 STRL IVOR (GLOVE) ×4 IMPLANT
GOWN PREVENTION PLUS LG XLONG (DISPOSABLE) IMPLANT
KIT BASIN OR (CUSTOM PROCEDURE TRAY) ×2 IMPLANT
MANIFOLD NEPTUNE II (INSTRUMENTS) ×2 IMPLANT
NEEDLE MA TROC 1/2 (NEEDLE) IMPLANT
NS IRRIG 1000ML POUR BTL (IV SOLUTION) IMPLANT
PACK SHOULDER CUSTOM OPM052 (CUSTOM PROCEDURE TRAY) ×2 IMPLANT
PASSER SUT SWANSON 36MM LOOP (INSTRUMENTS) ×2 IMPLANT
PATCH TISSUE MEND 3X3CM (Orthopedic Implant) ×2 IMPLANT
POSITIONER SURGICAL ARM (MISCELLANEOUS) ×2 IMPLANT
SLING ARM IMMOBILIZER LRG (SOFTGOODS) ×2 IMPLANT
SPONGE LAP 4X18 X RAY DECT (DISPOSABLE) ×2 IMPLANT
SPONGE SURGIFOAM ABS GEL 100 (HEMOSTASIS) ×2 IMPLANT
STAPLER VISISTAT 35W (STAPLE) ×2 IMPLANT
STRIP CLOSURE SKIN 1/2X4 (GAUZE/BANDAGES/DRESSINGS) ×2 IMPLANT
SUCTION FRAZIER 12FR DISP (SUCTIONS) ×2 IMPLANT
SUT BONE WAX W31G (SUTURE) ×2 IMPLANT
SUT ETHIBOND NAB CT1 #1 30IN (SUTURE) ×6 IMPLANT
SUT MNCRL AB 4-0 PS2 18 (SUTURE) ×2 IMPLANT
SUT VIC AB 0 CT1 27 (SUTURE) ×1
SUT VIC AB 0 CT1 27XBRD ANTBC (SUTURE) ×1 IMPLANT
SUT VIC AB 1 CT1 27 (SUTURE) ×2
SUT VIC AB 1 CT1 27XBRD ANTBC (SUTURE) ×2 IMPLANT
SUT VIC AB 2-0 CT1 27 (SUTURE)
SUT VIC AB 2-0 CT1 27XBRD (SUTURE) IMPLANT
TOWEL OR 17X26 10 PK STRL BLUE (TOWEL DISPOSABLE) ×4 IMPLANT

## 2012-07-28 NOTE — Brief Op Note (Signed)
07/28/2012  1:10 PM  PATIENT:  [name]  52 y.o. female  PRE-OPERATIVE DIAGNOSIS:  right shoulder impingement;Rotator Cuff Tear and Calcific Tendonitis,  POST-OPERATIVE DIAGNOSIS:  right shoulder impingement, calcification right rotator cuff, and Rotator Cuff Tear. right rotator cuff tear  PROCEDURE:  Procedure(s): RIGHT SHOULDER DECOMPRESSION AND EXCISION OF CALCIFIC DEPOSIT (Right) Rotator Cuff,Repair of Torn Rotator Cuff and Tissue Mend Graft and one anchor was used.  SURGEON:  Surgeon(s) and Role:    * Jacki Cones, MD - Primary  PHYSICIAN ASSISTANT: Dimitri Ped PA  ASSISTANTS: Dimitri Ped PA   ANESTHESIA:   general  EBL:  Total I/O In: 1000 [I.V.:1000] Out: -   BLOOD ADMINISTERED:none  DRAINS: none   LOCAL MEDICATIONS USED:  BUPIVICAINE 20cc mixed with 10cc of Normal Saline  SPECIMEN:  Source of Specimen:  Rotator Cuff  DISPOSITION OF SPECIMEN:  PATHOLOGY  COUNTS:  YES  TOURNIQUET:  * No tourniquets in log *  DICTATION: .Other Dictation: Dictation Number [id number]  PLAN OF CARE: Admit for overnight observation  PATIENT DISPOSITION:  Stable in OR   Delay start of Pharmacological VTE agent (>24hrs) due to surgical blood loss or risk of bleeding: yes

## 2012-07-28 NOTE — Anesthesia Preprocedure Evaluation (Addendum)
Anesthesia Evaluation  Patient identified by MRN, date of birth, ID band Patient awake    Reviewed: Allergy & Precautions, H&P , NPO status , Patient's Chart, lab work & pertinent test results  Airway Mallampati: II TM Distance: >3 FB Neck ROM: Full    Dental  (+) Teeth Intact and Dental Advisory Given   Pulmonary neg pulmonary ROS,  breath sounds clear to auscultation  Pulmonary exam normal       Cardiovascular negative cardio ROS  Rhythm:Regular Rate:Normal     Neuro/Psych negative neurological ROS  negative psych ROS   GI/Hepatic negative GI ROS, Neg liver ROS,   Endo/Other  negative endocrine ROS  Renal/GU negative Renal ROS  negative genitourinary   Musculoskeletal negative musculoskeletal ROS (+)   Abdominal   Peds  Hematology negative hematology ROS (+)   Anesthesia Other Findings   Reproductive/Obstetrics                         Anesthesia Physical Anesthesia Plan  ASA: II  Anesthesia Plan: General   Post-op Pain Management:    Induction: Intravenous  Airway Management Planned: Oral ETT  Additional Equipment:   Intra-op Plan:   Post-operative Plan: Extubation in OR  Informed Consent: I have reviewed the patients History and Physical, chart, labs and discussed the procedure including the risks, benefits and alternatives for the proposed anesthesia with the patient or authorized representative who has indicated his/her understanding and acceptance.   Dental advisory given  Plan Discussed with: CRNA  Anesthesia Plan Comments:         Anesthesia Quick Evaluation

## 2012-07-28 NOTE — Transfer of Care (Signed)
Immediate Anesthesia Transfer of Care Note  Patient: [name]  Procedure(s) Performed: Procedure(s): RIGHT SHOULDER DECOMPRESSION AND EXCISION OF CALCIFIC DEPOSIT (Right)  Patient Location: PACU  Anesthesia Type:General  Level of Consciousness: awake, alert , oriented and patient cooperative  Airway & Oxygen Therapy: Patient Spontanous Breathing and Patient connected to face mask oxygen  Post-op Assessment: Report given to PACU RN, Post -op Vital signs reviewed and stable and Patient moving all extremities X 4  Post vital signs: stable  Complications: No apparent anesthesia complications

## 2012-07-28 NOTE — Interval H&P Note (Signed)
History and Physical Interval Note:  07/28/2012 11:20 AM  [name]  has presented today for surgery, with the diagnosis of right shoulder impingement   The various methods of treatment have been discussed with the patient and family. After consideration of risks, benefits and other options for treatment, the patient has consented to  Procedure(s): RIGHT SHOULDER DECOMPRESSION AND EXCISION OF CALCIFIC DEPOSIT (Right) as a surgical intervention .  The patient's history has been reviewed, patient examined, no change in status, stable for surgery.  I have reviewed the patient's chart and labs.  Questions were answered to the patient's satisfaction.     Tekelia Kareem A

## 2012-07-28 NOTE — Op Note (Signed)
[name], [name] NO.:  [id number]  MEDICAL RECORD NO.:  [id number]  LOCATION:  1532                         FACILITY:  [facility]  PHYSICIAN:  Georges Lynch. Diontay Rosencrans, M.D.DATE OF BIRTH:  [birth year]  DATE OF PROCEDURE:  07/28/2012 DATE OF DISCHARGE:                              OPERATIVE REPORT   PREOPERATIVE DIAGNOSES: 1. Calcific tendinitis, right rotator cuff. 2. Partial tear, right rotator cuff. 3. Severe impingement, right shoulder.  POSTOPERATIVE DIAGNOSES: 1. Calcific tendinitis, right rotator cuff. 2. Partial tear, right rotator cuff. 3. Severe impingement, right shoulder.  OPERATION: 1. Open acromionectomy and acromioplasty, right shoulder. 2. Excision of the calcific deposits from the rotator cuff tendon,     right shoulder. 3. Repair of the torn rotator cuff tendon, right shoulder. 4. TissueMend graft with one anchor for reinforcement of the torn     rotator cuff tendon, right shoulder.  PROCEDURE:  Under general anesthesia with the patient on the beach-chair position, a routine orthopedic prepping and draping of the right shoulder was carried out.  She had 1 g of IV Ancef.  At this time, appropriate time-out was carried out.  I also marked the appropriate right arm in the holding area.  An incision was made over the anterior aspect of the acromion, extended down distally and a small incision was carried through the small portion of the proximal deltoid muscle.  At this point, the deltoid tendon was stripped by sharp dissection from the acromion.  I then went down and protected the underlying cuff with a Bennett retractor and did a partial acromionectomy.  She had a big portion of bone, literally digging down into the rotator cuff.  Once we excised that with the oscillating saw, then utilized the bur to even the undersurface of the acromion.  I then thoroughly irrigated out the area, bone waxed the undersurface of the acromion.  Following that,  I identified a large piece of calcification within the rotator cuff himself.  We carefully avoided the biceps tendon and also had little involvement of the calcification in the biceps tendon region.  After excising large portion of calcification, we had a defect in the cup and we had to do a primary repair of the cuff.  Following that, I utilized a TissueMend graft with one anchor to reinforce the rotator cuff repair. We thoroughly irrigated out the area.  The specimen of the calcification was sent to Pathology.  After irrigating the area, I reapproximated the deltoid tendon and muscle in usual fashion. The wound was closed in usual fashion with a Monocryl subcuticular closure.  Sterile dressings were applied.  She will be placed in a shoulder immobilizer.  Prior to closing the wound, I injected a mixture of 20 mL of Exparel with 10 mL of normal saline.          ______________________________ Georges Lynch. Darrelyn Hillock, M.D.     RAG/MEDQ  D:  07/28/2012  T:  07/28/2012  Job:  [id number]

## 2012-07-28 NOTE — Anesthesia Postprocedure Evaluation (Signed)
Anesthesia Post Note  Patient: [name]  Procedure(s) Performed: Procedure(s) (LRB): RIGHT SHOULDER DECOMPRESSION AND EXCISION OF CALCIFIC DEPOSIT (Right)  Anesthesia type: General  Patient location: PACU  Post pain: Pain level controlled  Post assessment: Post-op Vital signs reviewed  Last Vitals:  Filed Vitals:   07/28/12 1745  BP: 133/76  Pulse: 80  Temp: 36.9 C  Resp: 14    Post vital signs: Reviewed  Level of consciousness: sedated  Complications: No apparent anesthesia complications

## 2012-07-29 ENCOUNTER — Encounter (HOSPITAL_COMMUNITY): Payer: Self-pay | Admitting: Orthopedic Surgery

## 2012-07-29 MED ORDER — METHOCARBAMOL 500 MG PO TABS: 500.0000 mg | ORAL_TABLET | Freq: Four times a day (QID) | ORAL | Status: DC | PRN

## 2012-07-29 MED ORDER — ONDANSETRON HCL 4 MG PO TABS: 4.0000 mg | ORAL_TABLET | Freq: Four times a day (QID) | ORAL | Status: DC | PRN

## 2012-07-29 MED ORDER — OXYCODONE-ACETAMINOPHEN 5-325 MG PO TABS: 1.0000 | ORAL_TABLET | ORAL | Status: DC | PRN

## 2012-07-29 MED FILL — Scopolamine TD Patch 72HR 1 MG/3DAYS: TRANSDERMAL | Qty: 1 | Status: AC
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
## 2012-07-29 NOTE — Progress Notes (Signed)
Subjective: 1 Day Post-Op Procedure(s) (LRB): RIGHT SHOULDER DECOMPRESSION AND EXCISION OF CALCIFIC DEPOSIT (Right) Patient reports pain as 5 on 0-10 scale. Circulation in hand intact. No major problems today.   Objective: Vital signs in last 24 hours: Temp:  [97.9 F (36.6 C)-98.8 F (37.1 C)] 98.5 F (36.9 C) (05/22 0556) Pulse Rate:  [70-103] 76 (05/22 0556) Resp:  [10-18] 18 (05/22 0556) BP: (110-150)/(63-97) 116/66 mmHg (05/22 0556) SpO2:  [91 %-100 %] 98 % (05/22 0556) Weight:  [71.215 kg (157 lb)] 71.215 kg (157 lb) (05/21 1745)  Intake/Output from previous day: 05/21 0701 - 05/22 0700 In: 2400 [I.V.:2400] Out: 1500 [Urine:1500] Intake/Output this shift:     Recent Labs  07/26/12 1345  HGB 13.2    Recent Labs  07/26/12 1345  WBC 11.3*  RBC 4.29  HCT 40.4  PLT 280    Recent Labs  07/26/12 1345  NA 141  K 3.5  CL 102  CO2 30  BUN 12  CREATININE 0.46*  GLUCOSE 79  CALCIUM 8.5    Recent Labs  07/26/12 1345  INR 0.90    Neurologically intact  Assessment/Plan: 1 Day Post-Op Procedure(s) (LRB): RIGHT SHOULDER DECOMPRESSION AND EXCISION OF CALCIFIC DEPOSIT (Right) Discharge home with home health  [name] A 07/29/2012, 7:15 AM

## 2012-07-29 NOTE — Care Management Note (Signed)
    Page 1 of 1   07/29/2012     10:51:14 AM   CARE MANAGEMENT NOTE 07/29/2012  Patient:  [name], [name]   Account Number:  [id number]  Date Initiated:  07/29/2012  Documentation initiated by:  Lorenda Ishihara  Subjective/Objective Assessment:   52 yo female admitted s/p rotator cuff repair.     Action/Plan:   Home when stable   Anticipated DC Date:  07/29/2012   Anticipated DC Plan:  HOME/SELF CARE      DC Planning Services  CM consult      Choice offered to / List presented to:             Status of service:  Completed, signed off Medicare Important Message given?   (If response is "NO", the following Medicare IM given date fields will be blank) Date Medicare IM given:   Date Additional Medicare IM given:    Discharge Disposition:  HOME/SELF CARE  Per UR Regulation:  Reviewed for med. necessity/level of care/duration of stay  If discussed at Long Length of Stay Meetings, dates discussed:    Comments:  07-29-12 Lorenda Ishihara RN CM No HH needs identified, no orders for [facility].

## 2012-07-29 NOTE — Evaluation (Signed)
Occupational Therapy Evaluation Patient Details Name: [name] MRN: [id number] DOB: 14-Sep-1960 Today's Date: 07/29/2012 Time: 4098-1191 OT Time Calculation (min): 19 min  OT Assessment / Plan / Recommendation Clinical Impression  Pt is s/p R shoulder surgery and all education completed with pt/family for d/c home.     OT Assessment  Progress rehab of shoulder as ordered by MD at follow-up appointment    Follow Up Recommendations  Supervision - Intermittent;No OT follow up    Barriers to Discharge      Equipment Recommendations  None recommended by OT    Recommendations for Other Services    Frequency       Precautions / Restrictions Precautions Precautions: Shoulder Precaution Comments: Issued shoulder care handout and reviewed with pt/family Required Braces or Orthoses: Other Brace/Splint Other Brace/Splint: R shoulder immobiizer Restrictions Weight Bearing Restrictions: Yes RUE Weight Bearing: Non weight bearing        ADL  ADL Comments: caregiver (aunt) assisted with donning sling. Pt already dressed when OT arrived. Clinical judgement that pt mod assist for UB self care.    OT Diagnosis:    OT Problem List:   OT Treatment Interventions:     OT Goals    Visit Information  Last OT Received On: 07/29/12 Assistance Needed: +1    Subjective Data  Subjective: I have been up to the bathroom Patient Stated Goal: ready to go home   Prior Functioning     Home Living Lives With: Spouse Available Help at Discharge: Family Dominant Hand: Right         Vision/Perception     Cognition  Cognition Arousal/Alertness: Awake/alert Behavior During Therapy: WFL for tasks assessed/performed    Extremity/Trunk Assessment       Mobility       Exercise Other Exercises Other Exercises: elbow, wrist and hand ROM flexion/extension X10 supervision standing Donning/doffing shirt without moving shoulder: Patient able to independently direct  caregiver Method for sponge bathing under operated UE: Patient able to independently direct caregiver Donning/doffing sling/immobilizer: Patient able to independently direct caregiver;Caregiver independent with task Correct positioning of sling/immobilizer: Patient able to independently direct caregiver;Caregiver independent with task ROM for elbow, wrist and digits of operated UE: Supervision/safety Sling wearing schedule (on at all times/off for ADL's): Patient able to independently direct caregiver;Caregiver independent with task Proper positioning of operated UE when showering: Patient able to independently direct caregiver;Caregiver independent with task Dressing change: Patient able to independently direct caregiver (dressing already changed. pt verbalized understanding) Positioning of UE while sleeping: Patient able to independently direct caregiver;Caregiver independent with task   Balance     End of Session OT - End of Session Activity Tolerance: Patient tolerated treatment well Patient left: with family/visitor present;in bed (EOB)  GO     [name] [phone] 07/29/2012, 9:29 AM

## 2012-07-30 NOTE — Progress Notes (Signed)
07/29/12 1243  OT G-codes **NOT FOR INPATIENT CLASS**  Functional Assessment Tool Used clinical judgement  Functional Limitation Self care  Self Care Current Status [phone]) CJ  Self Care Goal Status (U0454) [facility]  Self Care Discharge Status [phone]) Eppie Gibson OTR/L [phone] 07/30/2012

## 2012-08-23 ENCOUNTER — Telehealth: Payer: Self-pay | Admitting: Family Medicine

## 2012-08-23 NOTE — Telephone Encounter (Signed)
appt made

## 2012-09-06 ENCOUNTER — Ambulatory Visit (INDEPENDENT_AMBULATORY_CARE_PROVIDER_SITE_OTHER): Payer: PRIVATE HEALTH INSURANCE | Admitting: Family Medicine

## 2012-09-06 ENCOUNTER — Encounter: Payer: Self-pay | Admitting: Family Medicine

## 2012-09-06 VITALS — BP 120/80 | HR 80 | Temp 98.1°F | Ht 63.75 in | Wt 154.6 lb

## 2012-09-06 DIAGNOSIS — Z789 Other specified health status: Secondary | ICD-10-CM | POA: Insufficient documentation

## 2012-09-06 DIAGNOSIS — F411 Generalized anxiety disorder: Secondary | ICD-10-CM

## 2012-09-06 DIAGNOSIS — H8109 Meniere's disease, unspecified ear: Secondary | ICD-10-CM | POA: Insufficient documentation

## 2012-09-06 DIAGNOSIS — R5381 Other malaise: Secondary | ICD-10-CM

## 2012-09-06 DIAGNOSIS — Z139 Encounter for screening, unspecified: Secondary | ICD-10-CM

## 2012-09-06 DIAGNOSIS — H8103 Meniere's disease, bilateral: Secondary | ICD-10-CM

## 2012-09-06 DIAGNOSIS — E785 Hyperlipidemia, unspecified: Secondary | ICD-10-CM | POA: Insufficient documentation

## 2012-09-06 DIAGNOSIS — S43429A Sprain of unspecified rotator cuff capsule, initial encounter: Secondary | ICD-10-CM

## 2012-09-06 DIAGNOSIS — R5383 Other fatigue: Secondary | ICD-10-CM

## 2012-09-06 DIAGNOSIS — Z888 Allergy status to other drugs, medicaments and biological substances status: Secondary | ICD-10-CM

## 2012-09-06 DIAGNOSIS — M75102 Unspecified rotator cuff tear or rupture of left shoulder, not specified as traumatic: Secondary | ICD-10-CM | POA: Insufficient documentation

## 2012-09-06 MED ORDER — CLONAZEPAM 0.5 MG PO TABS: 0.5000 mg | ORAL_TABLET | Freq: Three times a day (TID) | ORAL | Status: DC | PRN

## 2012-09-06 MED ORDER — MECLIZINE HCL 12.5 MG PO TABS: 12.5000 mg | ORAL_TABLET | Freq: Three times a day (TID) | ORAL | Status: DC | PRN

## 2012-09-06 MED ORDER — PAROXETINE HCL ER 25 MG PO TB24: 25.0000 mg | ORAL_TABLET | Freq: Every day | ORAL | Status: DC

## 2012-09-06 NOTE — Addendum Note (Signed)
Addended by: Bearl Mulberry on: 09/06/2012 03:03 PM   Modules accepted: Orders

## 2012-09-06 NOTE — Progress Notes (Signed)
  Subjective:    Patient ID: [name], female    DOB: Oct 06, 1960, 52 y.o.   MRN: [id number]  HPI Patient returns to clinic today for followup of chronic medical problems. This included hyperlipidemia, general anxiety disorder, Mnire's disease and statin intolerance. She is 4-1/2 weeks status post rotator cuff surgery on the right. See the review of systems. More problems with Mnire's, and that she had 3 attacks within one week's time. Last episode was 2 weeks ago.   Review of Systems  Constitutional: Positive for fatigue.  Eyes: Negative.   Respiratory: Negative.   Cardiovascular: Negative.   Gastrointestinal: Negative.   Endocrine: Negative.   Genitourinary: Negative.   Musculoskeletal: Negative.   Neurological: Positive for dizziness (intermitent) and headaches (intermitent).  Psychiatric/Behavioral: Positive for sleep disturbance (frequent).       Objective:   Physical Exam BP 120/80  Pulse 80  Temp(Src) 98.1 F (36.7 C) (Oral)  Ht 5' 3.75" (1.619 m)  Wt 154 lb 9.6 oz (70.126 kg)  BMI 26.75 kg/m2  The patient appeared well nourished and normally developed, alert and oriented to time and place. Speech, behavior and judgement appear normal. Vital signs as documented.  Head exam is unremarkable. No scleral icterus or pallor noted. TMs were normal. There was no cerumen. Neck is without jugular venous distension, thyromegally, or carotid bruits. Carotid upstrokes are brisk bilaterally. No cervical adenopathy. Lungs are clear anteriorly and posteriorly to auscultation. Normal respiratory effort. Cardiac exam reveals regular rate and rhythm at 72 per minute. First and second heart sounds normal.  No murmurs, rubs or gallops.  Abdominal exam reveals normal bowl sounds, no masses, no organomegaly and no aortic enlargement. No inguinal adenopathy. Extremities are nonedematous and both femoral and pedal pulses are normal. Skin without pallor or jaundice.  Warm and dry,  without rash. Neurologic exam reveals normal deep tendon reflexes and normal sensation.          Assessment & Plan:  1. Hyperlipidemia  2. Generalized anxiety disorder  3. Meniere's disease, bilateral  4. Statin intolerance  5. Left rotator cuff tear -Pending future surgery  Patient Instructions  Continue current medications. Continue physical therapy for right shoulder. Try the meclizine half, two - 3 times daily Return to clinic for fasting lab work

## 2012-09-06 NOTE — Patient Instructions (Addendum)
Continue current medications. Continue physical therapy for right shoulder. Try the meclizine half, two - 3 times daily Return to clinic for fasting lab work

## 2012-09-08 ENCOUNTER — Encounter: Payer: Self-pay | Admitting: Internal Medicine

## 2012-09-14 ENCOUNTER — Ambulatory Visit: Payer: PRIVATE HEALTH INSURANCE | Attending: Orthopedic Surgery | Admitting: Physical Therapy

## 2012-09-14 DIAGNOSIS — M25519 Pain in unspecified shoulder: Secondary | ICD-10-CM | POA: Insufficient documentation

## 2012-09-14 DIAGNOSIS — R5381 Other malaise: Secondary | ICD-10-CM | POA: Insufficient documentation

## 2012-09-14 DIAGNOSIS — M25619 Stiffness of unspecified shoulder, not elsewhere classified: Secondary | ICD-10-CM | POA: Insufficient documentation

## 2012-09-14 DIAGNOSIS — IMO0001 Reserved for inherently not codable concepts without codable children: Secondary | ICD-10-CM | POA: Insufficient documentation

## 2012-09-16 ENCOUNTER — Ambulatory Visit: Payer: PRIVATE HEALTH INSURANCE | Admitting: Physical Therapy

## 2012-09-20 ENCOUNTER — Ambulatory Visit: Payer: PRIVATE HEALTH INSURANCE | Admitting: *Deleted

## 2012-09-23 ENCOUNTER — Encounter: Payer: PRIVATE HEALTH INSURANCE | Admitting: Physical Therapy

## 2012-09-24 ENCOUNTER — Other Ambulatory Visit: Payer: Self-pay | Admitting: Urology

## 2012-09-24 DIAGNOSIS — R3129 Other microscopic hematuria: Secondary | ICD-10-CM

## 2012-09-28 ENCOUNTER — Ambulatory Visit (HOSPITAL_COMMUNITY)
Admission: RE | Admit: 2012-09-28 | Discharge: 2012-09-28 | Disposition: A | Discharge status: Home / Self Care | Payer: PRIVATE HEALTH INSURANCE | Source: Ambulatory Visit | Attending: Urology | Admitting: Urology

## 2012-09-28 DIAGNOSIS — N7013 Chronic salpingitis and oophoritis: Secondary | ICD-10-CM | POA: Insufficient documentation

## 2012-09-28 DIAGNOSIS — N83209 Unspecified ovarian cyst, unspecified side: Secondary | ICD-10-CM | POA: Insufficient documentation

## 2012-09-28 DIAGNOSIS — N72 Inflammatory disease of cervix uteri: Secondary | ICD-10-CM | POA: Insufficient documentation

## 2012-09-28 DIAGNOSIS — K429 Umbilical hernia without obstruction or gangrene: Secondary | ICD-10-CM | POA: Insufficient documentation

## 2012-09-28 DIAGNOSIS — R3129 Other microscopic hematuria: Secondary | ICD-10-CM | POA: Insufficient documentation

## 2012-09-28 MED ORDER — IOHEXOL 300 MG/ML  SOLN
125.0000 mL | Freq: Once | INTRAMUSCULAR | Status: AC | PRN
  Administered 2012-09-28: 125 mL via INTRAVENOUS

## 2012-10-01 ENCOUNTER — Ambulatory Visit (INDEPENDENT_AMBULATORY_CARE_PROVIDER_SITE_OTHER): Payer: PRIVATE HEALTH INSURANCE | Admitting: Urology

## 2012-10-01 DIAGNOSIS — R81 Glycosuria: Secondary | ICD-10-CM

## 2012-10-01 DIAGNOSIS — R3129 Other microscopic hematuria: Secondary | ICD-10-CM

## 2012-10-04 ENCOUNTER — Other Ambulatory Visit (INDEPENDENT_AMBULATORY_CARE_PROVIDER_SITE_OTHER): Payer: PRIVATE HEALTH INSURANCE

## 2012-10-04 DIAGNOSIS — E785 Hyperlipidemia, unspecified: Secondary | ICD-10-CM

## 2012-10-04 DIAGNOSIS — Z1212 Encounter for screening for malignant neoplasm of rectum: Secondary | ICD-10-CM

## 2012-10-04 DIAGNOSIS — R5381 Other malaise: Secondary | ICD-10-CM

## 2012-10-04 DIAGNOSIS — R5383 Other fatigue: Secondary | ICD-10-CM

## 2012-10-04 LAB — POCT CBC
Hemoglobin: 13.3 g/dL (ref 12.2–16.2)
MCHC: 34 g/dL (ref 31.8–35.4)
MPV: 7.6 fL (ref 0–99.8)
POC Granulocyte: 3.9 (ref 2–6.9)
POC LYMPH PERCENT: 23.4 %L (ref 10–50)
RBC: 4.2 M/uL (ref 4.04–5.48)

## 2012-10-04 LAB — BASIC METABOLIC PANEL WITH GFR
BUN: 10 mg/dL (ref 6–23)
CO2: 26 mEq/L (ref 19–32)
Chloride: 104 mEq/L (ref 96–112)
Creat: 0.54 mg/dL (ref 0.50–1.10)
Glucose, Bld: 88 mg/dL (ref 70–99)

## 2012-10-04 LAB — HEPATIC FUNCTION PANEL
AST: 20 U/L (ref 0–37)
Albumin: 4.2 g/dL (ref 3.5–5.2)
Total Bilirubin: 0.5 mg/dL (ref 0.3–1.2)

## 2012-10-04 LAB — THYROID PANEL WITH TSH
T3 Uptake: 30 % (ref 22.5–37.0)
T4, Total: 7 ug/dL (ref 5.0–12.5)
TSH: 2.874 u[IU]/mL (ref 0.350–4.500)

## 2012-10-04 NOTE — Progress Notes (Signed)
Patient dropped off fobt 

## 2012-10-04 NOTE — Addendum Note (Signed)
Addended by: Orma Render F on: 10/04/2012 04:02 PM   Modules accepted: Orders

## 2012-10-05 LAB — NMR LIPOPROFILE WITH LIPIDS
Cholesterol, Total: 230 mg/dL — ABNORMAL HIGH
HDL Particle Number: 40.2 umol/L
HDL Size: 9.5 nm
HDL-C: 50 mg/dL
LDL (calc): 134 mg/dL — ABNORMAL HIGH
LDL Particle Number: 2446 nmol/L — ABNORMAL HIGH
LDL Size: 19.7 nm — ABNORMAL LOW
LP-IR Score: 50 — ABNORMAL HIGH
Large HDL-P: 7.2 umol/L
Large VLDL-P: 5.3 nmol/L — ABNORMAL HIGH
Small LDL Particle Number: 1925 nmol/L — ABNORMAL HIGH
Triglycerides: 231 mg/dL — ABNORMAL HIGH
VLDL Size: 48 nm — ABNORMAL HIGH

## 2012-10-05 LAB — VITAMIN D 25 HYDROXY (VIT D DEFICIENCY, FRACTURES): Vit D, 25-Hydroxy: 53 ng/mL (ref 30–89)

## 2012-10-05 LAB — FECAL OCCULT BLOOD, IMMUNOCHEMICAL: Fecal Occult Blood: NEGATIVE

## 2012-10-11 ENCOUNTER — Telehealth: Payer: Self-pay | Admitting: *Deleted

## 2012-10-11 NOTE — Telephone Encounter (Signed)
Pt notified of lab results

## 2012-10-11 NOTE — Telephone Encounter (Signed)
Message copied by Bearl Mulberry on Mon Oct 11, 2012  8:40 AM ------      Message from: Ernestina Penna      Created: Tue Oct 05, 2012  1:06 PM       Electrolytes blood sugar and kidney function tests all within normal limits      The total LDL particle number which should be less than 1000 was 2446. This is very elevated. The LDL C. was 134. That should be less than 100. The triglycerides were elevated at 231. The should be less than 150. The small LDL particle number was also very elevated at 1925.------- what is the current status of taking cholesterol medicine? Has she been taking any? Issue sticking with her diet and exercise regimen ?   She must do better with all of these answers.      Vitamin D is good at 74.      All thyroid tests are within normal limits .      All over function tests are within normal limits. ------

## 2012-11-04 ENCOUNTER — Ambulatory Visit (AMBULATORY_SURGERY_CENTER): Payer: PRIVATE HEALTH INSURANCE

## 2012-11-04 VITALS — Ht 64.5 in | Wt 154.0 lb

## 2012-11-04 DIAGNOSIS — Z1211 Encounter for screening for malignant neoplasm of colon: Secondary | ICD-10-CM

## 2012-11-09 ENCOUNTER — Telehealth: Payer: Self-pay | Admitting: Internal Medicine

## 2012-11-09 NOTE — Telephone Encounter (Signed)
Called in a prescription for Moviprep bowel prep, 1 kit into the [facility] in [place]. Pt was notified.

## 2012-11-18 ENCOUNTER — Encounter: Payer: Self-pay | Admitting: Internal Medicine

## 2012-11-18 ENCOUNTER — Ambulatory Visit (AMBULATORY_SURGERY_CENTER): Payer: PRIVATE HEALTH INSURANCE | Admitting: Internal Medicine

## 2012-11-18 VITALS — BP 136/81 | HR 68 | Temp 96.7°F | Resp 16 | Ht 64.5 in | Wt 154.0 lb

## 2012-11-18 DIAGNOSIS — Z1211 Encounter for screening for malignant neoplasm of colon: Secondary | ICD-10-CM

## 2012-11-18 DIAGNOSIS — D126 Benign neoplasm of colon, unspecified: Secondary | ICD-10-CM

## 2012-11-18 MED ORDER — SODIUM CHLORIDE 0.9 % IV SOLN: 500.0000 mL | INTRAVENOUS | Status: DC

## 2012-11-18 NOTE — Progress Notes (Signed)
Patient did not experience any of the following events: a burn prior to discharge; a fall within the facility; wrong site/side/patient/procedure/implant event; or a hospital transfer or hospital admission upon discharge from the facility. (G8907) Patient did not have preoperative order for IV antibiotic SSI prophylaxis. (G8918)  

## 2012-11-18 NOTE — Patient Instructions (Addendum)
1 polyp removed and sent to pathology.  Await results for final recommendation.    YOU HAD AN ENDOSCOPIC PROCEDURE TODAY AT THE [facility]: Refer to the procedure report that was given to you for any specific questions about what was found during the examination.  If the procedure report does not answer your questions, please call your gastroenterologist to clarify.  If you requested that your care partner not be given the details of your procedure findings, then the procedure report has been included in a sealed envelope for you to review at your convenience later.  YOU SHOULD EXPECT: Some feelings of bloating in the abdomen. Passage of more gas than usual.  Walking can help get rid of the air that was put into your GI tract during the procedure and reduce the bloating. If you had a lower endoscopy (such as a colonoscopy or flexible sigmoidoscopy) you may notice spotting of blood in your stool or on the toilet paper. If you underwent a bowel prep for your procedure, then you may not have a normal bowel movement for a few days.  DIET: Your first meal following the procedure should be a light meal and then it is ok to progress to your normal diet.  A half-sandwich or bowl of soup is an example of a good first meal.  Heavy or fried foods are harder to digest and may make you feel nauseous or bloated.  Likewise meals heavy in dairy and vegetables can cause extra gas to form and this can also increase the bloating.  Drink plenty of fluids but you should avoid alcoholic beverages for 24 hours.  ACTIVITY: Your care partner should take you home directly after the procedure.  You should plan to take it easy, moving slowly for the rest of the day.  You can resume normal activity the day after the procedure however you should NOT DRIVE or use heavy machinery for 24 hours (because of the sedation medicines used during the test).    SYMPTOMS TO REPORT IMMEDIATELY: A gastroenterologist can be reached at  any hour.  During normal business hours, 8:30 AM to 5:00 PM Monday through Friday, call [phone].  After hours and on weekends, please call the GI answering service at [phone] who will take a message and have the physician on call contact you.   Following lower endoscopy (colonoscopy or flexible sigmoidoscopy):  Excessive amounts of blood in the stool  Significant tenderness or worsening of abdominal pains  Swelling of the abdomen that is new, acute  Fever of 100F or higher  FOLLOW UP: If any biopsies were taken you will be contacted by phone or by letter within the next 1-3 weeks.  Call your gastroenterologist if you have not heard about the biopsies in 3 weeks.  Our staff will call the home number listed on your records the next business day following your procedure to check on you and address any questions or concerns that you may have at that time regarding the information given to you following your procedure. This is a courtesy call and so if there is no answer at the home number and we have not heard from you through the emergency physician on call, we will assume that you have returned to your regular daily activities without incident.  SIGNATURES/CONFIDENTIALITY: You and/or your care partner have signed paperwork which will be entered into your electronic medical record.  These signatures attest to the fact that that the information above on your After  Visit Summary has been reviewed and is understood.  Full responsibility of the confidentiality of this discharge information lies with you and/or your care-partner. 

## 2012-11-18 NOTE — Op Note (Signed)
[facility] [address] [place], [address]   COLONOSCOPY PROCEDURE REPORT  PATIENT: Jade, Miller  MR#: [id number] BIRTHDATE: 23-Jan-1961 , 52  yrs. old GENDER: Female ENDOSCOPIST: Roxy Cedar, MD REFERRED DG:UYQIHK Christell Constant, M.D. PROCEDURE DATE:  11/18/2012 PROCEDURE:   Colonoscopy with snare polypectomy x 1 First Screening Colonoscopy - Avg.  risk and is 50 yrs.  old or older Yes.  Prior Negative Screening - Now for repeat screening. N/A  History of Adenoma - Now for follow-up colonoscopy & has been > or = to 3 yrs.  N/A  Polyps Removed Today? Yes. ASA CLASS:   Class II INDICATIONS:average risk screening. MEDICATIONS: MAC sedation, administered by CRNA and propofol (Diprivan) 200mg  IV  DESCRIPTION OF PROCEDURE:   After the risks benefits and alternatives of the procedure were thoroughly explained, informed consent was obtained.  A digital rectal exam revealed no abnormalities of the rectum.   The LB [id number]  endoscope was introduced through the anus and advanced to the cecum, which was identified by both the appendix and ileocecal valve. No adverse events experienced.   The quality of the prep was excellent, using MoviPrep  The instrument was then slowly withdrawn as the colon was fully examined.     COLON FINDINGS: A diminutive polyp was found in the ascending colon. A polypectomy was performed with a cold snare.  The resection was complete and the polyp tissue was completely retrieved.   The colon mucosa was otherwise normal.  Retroflexed views revealed internal hemorrhoids. The time to cecum=3 minutes 32 seconds.  Withdrawal time=8 minutes 40 seconds.  The scope was withdrawn and the procedure completed.  COMPLICATIONS: There were no complications.  ENDOSCOPIC IMPRESSION: 1.   Diminutive polyp was found in the ascending colon; polypectomy was performed with a cold snare 2.   The colon mucosa was otherwise  normal  RECOMMENDATIONS: 1. Repeat colonoscopy in 5 years if polyp adenomatous; otherwise 10 years   eSigned:  Roxy Cedar, MD 11/18/2012 11:29 AM   cc: Rudi Heap, MD and The Patient   PATIENT NAME:  Jade, Miller MR#: [id number]

## 2012-11-18 NOTE — Progress Notes (Signed)
Called to room to assist during endoscopic procedure.  Patient ID and intended procedure confirmed with present staff. Received instructions for my participation in the procedure from the performing physician.  

## 2012-11-18 NOTE — Progress Notes (Signed)
Lidocaine-40mg IV prior to Propofol InductionPropofol given over incremental dosages 

## 2012-11-19 ENCOUNTER — Telehealth: Payer: Self-pay | Admitting: *Deleted

## 2012-11-19 NOTE — Telephone Encounter (Signed)
  Follow up Call-  Call back number 11/18/2012  Post procedure Call Back phone  # [phone]  Permission to leave phone message Yes     Patient questions:  Do you have a fever, pain , or abdominal swelling? no Pain Score  0 *  Have you tolerated food without any problems? yes  Have you been able to return to your normal activities? yes  Do you have any questions about your discharge instructions: Diet   no Medications  no Follow up visit  no  Do you have questions or concerns about your Care? no  Actions: * If pain score is 4 or above: No action needed, pain <4.

## 2012-11-24 ENCOUNTER — Encounter: Payer: Self-pay | Admitting: Internal Medicine

## 2013-01-28 ENCOUNTER — Other Ambulatory Visit: Payer: Self-pay

## 2013-01-28 DIAGNOSIS — Z1231 Encounter for screening mammogram for malignant neoplasm of breast: Secondary | ICD-10-CM

## 2013-02-15 ENCOUNTER — Telehealth: Payer: Self-pay | Admitting: Family Medicine

## 2013-02-15 NOTE — Telephone Encounter (Signed)
Appt given for tomorrow per pt request 

## 2013-02-16 ENCOUNTER — Ambulatory Visit: Payer: PRIVATE HEALTH INSURANCE | Admitting: Family Medicine

## 2013-02-23 ENCOUNTER — Ambulatory Visit: Payer: PRIVATE HEALTH INSURANCE | Admitting: Family Medicine

## 2013-03-04 ENCOUNTER — Ambulatory Visit
Admission: RE | Admit: 2013-03-04 | Discharge: 2013-03-04 | Disposition: A | Discharge status: Home / Self Care | Payer: PRIVATE HEALTH INSURANCE | Source: Ambulatory Visit

## 2013-03-04 DIAGNOSIS — Z1231 Encounter for screening mammogram for malignant neoplasm of breast: Secondary | ICD-10-CM

## 2013-03-09 ENCOUNTER — Ambulatory Visit: Payer: PRIVATE HEALTH INSURANCE | Admitting: Family Medicine

## 2013-03-23 ENCOUNTER — Other Ambulatory Visit: Payer: Self-pay | Admitting: Family Medicine

## 2013-03-27 ENCOUNTER — Other Ambulatory Visit: Payer: Self-pay | Admitting: Family Medicine

## 2013-03-29 ENCOUNTER — Ambulatory Visit: Payer: PRIVATE HEALTH INSURANCE | Admitting: Family Medicine

## 2013-03-29 NOTE — Telephone Encounter (Signed)
This is okay to refill 

## 2013-03-29 NOTE — Telephone Encounter (Signed)
Last seen 09/06/12, last filled 12/07/12 for #[id number]. Route to pool if approved, uses [facility]

## 2013-05-12 ENCOUNTER — Other Ambulatory Visit: Payer: Self-pay | Admitting: Family Medicine

## 2013-06-21 ENCOUNTER — Other Ambulatory Visit: Payer: Self-pay | Admitting: Family Medicine

## 2013-06-22 ENCOUNTER — Telehealth: Payer: Self-pay | Admitting: Family Medicine

## 2013-06-22 MED ORDER — PAROXETINE HCL ER 25 MG PO TB24: ORAL_TABLET | ORAL | Status: DC

## 2013-06-22 NOTE — Telephone Encounter (Signed)
Done- pt aware on VM

## 2013-07-12 ENCOUNTER — Other Ambulatory Visit: Payer: Self-pay | Admitting: Family Medicine

## 2013-07-15 NOTE — Telephone Encounter (Signed)
Last seen 09/06/12  Has an appt 07/21/13 [facility]    If approved route to nurse [facility]

## 2013-07-15 NOTE — Telephone Encounter (Signed)
These 2 prescriptions are okay to refill

## 2013-07-15 NOTE — Telephone Encounter (Signed)
rx called into pharmacy

## 2013-07-21 ENCOUNTER — Ambulatory Visit (INDEPENDENT_AMBULATORY_CARE_PROVIDER_SITE_OTHER): Payer: PRIVATE HEALTH INSURANCE | Admitting: Family Medicine

## 2013-07-21 ENCOUNTER — Encounter: Payer: Self-pay | Admitting: Family Medicine

## 2013-07-21 VITALS — BP 118/81 | HR 92 | Temp 97.1°F | Ht 64.5 in | Wt 138.0 lb

## 2013-07-21 DIAGNOSIS — H9190 Unspecified hearing loss, unspecified ear: Secondary | ICD-10-CM

## 2013-07-21 DIAGNOSIS — Z0000 Encounter for general adult medical examination without abnormal findings: Secondary | ICD-10-CM

## 2013-07-21 DIAGNOSIS — H8109 Meniere's disease, unspecified ear: Secondary | ICD-10-CM

## 2013-07-21 DIAGNOSIS — E785 Hyperlipidemia, unspecified: Secondary | ICD-10-CM

## 2013-07-21 DIAGNOSIS — E559 Vitamin D deficiency, unspecified: Secondary | ICD-10-CM

## 2013-07-21 LAB — POCT CBC
Granulocyte percent: 78.7 %G (ref 37–80)
HCT, POC: 40.4 % (ref 37.7–47.9)
HEMOGLOBIN: 12.9 g/dL (ref 12.2–16.2)
Lymph, poc: 1.1 (ref 0.6–3.4)
MCH, POC: 29.7 pg (ref 27–31.2)
MCHC: 32 g/dL (ref 31.8–35.4)
MCV: 92.9 fL (ref 80–97)
MPV: 7 fL (ref 0–99.8)
POC GRANULOCYTE: 4.3 (ref 2–6.9)
POC LYMPH %: 20.1 % (ref 10–50)
Platelet Count, POC: 238 10*3/uL (ref 142–424)
RBC: 4.4 M/uL (ref 4.04–5.48)
RDW, POC: 13.3 %
WBC: 5.5 10*3/uL (ref 4.6–10.2)

## 2013-07-21 MED ORDER — PAROXETINE HCL ER 25 MG PO TB24: ORAL_TABLET | ORAL | Status: DC

## 2013-07-21 MED ORDER — CLONAZEPAM 0.5 MG PO TABS: ORAL_TABLET | ORAL | Status: DC

## 2013-07-21 MED ORDER — MECLIZINE HCL 12.5 MG PO TABS: 12.5000 mg | ORAL_TABLET | Freq: Three times a day (TID) | ORAL | Status: DC | PRN

## 2013-07-21 NOTE — Progress Notes (Signed)
Subjective:    Patient ID: [name], female    DOB: [birth year], 53 y.o.   MRN: [id number]  HPI Pt here for follow up and management of chronic medical problems. The patient indicates that her hearing is diminished in the left ear and she has had some nosebleeds. She is also now 16 pounds of weight. She will have lab work done today and she is going to check with her insurance regarding the Prevnar vaccine. She gets her pelvic exams done by her GYN in [place]. She is G2 get a chest x-ray but would like to wait another year or so she's not having any respiratory symptoms and she is not a smoker.        Patient Active Problem List   Diagnosis Date Noted  . Hyperlipidemia 09/06/2012  . Generalized anxiety disorder 09/06/2012  . Meniere's disease 09/06/2012  . Statin intolerance 09/06/2012  . Left rotator cuff tear 09/06/2012   Outpatient Encounter Prescriptions as of 07/21/2013  Medication Sig  . acetaminophen (TYLENOL) 500 MG tablet Take 500 mg by mouth every 6 (six) hours as needed for pain.  . clonazePAM (KLONOPIN) 0.5 MG tablet TAKE 1 TABLET THREE TIMES A DAY  . human chorionic gonadotropin (PREGNYL/NOVAREL) 10000 UNITS injection Inject 35 Units into the muscle 2 (two) times a week.  . meclizine (ANTIVERT) 12.5 MG tablet Take 1 tablet (12.5 mg total) by mouth 3 (three) times daily as needed.  Marland Kitchen PARoxetine (PAXIL-CR) 25 MG 24 hr tablet TAKE 1 TABLET BY MOUTH EVERY DAY  . Phendimetrazine Tartrate 105 MG CP24 Take by mouth.    Review of Systems  Constitutional: Negative.   HENT: Positive for nosebleeds.        Hearing is decreased in left ear  Eyes: Negative.   Respiratory: Negative.   Cardiovascular: Negative.   Gastrointestinal: Negative.   Endocrine: Negative.   Genitourinary: Negative.   Musculoskeletal: Negative.   Skin: Negative.   Allergic/Immunologic: Negative.   Neurological: Negative.   Hematological: Negative.   Psychiatric/Behavioral: Negative.         Objective:   Physical Exam  Nursing note and vitals reviewed. Constitutional: She is oriented to person, place, and time. She appears well-developed and well-nourished. No distress.  HENT:  Head: Normocephalic and atraumatic.  Right Ear: External ear normal.  Left Ear: External ear normal.  Nose: Nose normal.  Mouth/Throat: Oropharynx is clear and moist.  The left ear canal is clear and normal the TM appears normal and pearly gray  Eyes: Conjunctivae and EOM are normal. Pupils are equal, round, and reactive to light. Right eye exhibits no discharge. Left eye exhibits no discharge. No scleral icterus.  Neck: Normal range of motion. Neck supple. No thyromegaly present.  Cardiovascular: Normal rate, regular rhythm, normal heart sounds and intact distal pulses.  Exam reveals no gallop and no friction rub.   No murmur heard. At 84 per minute without murmur  Pulmonary/Chest: Effort normal and breath sounds normal. No respiratory distress. She has no wheezes. She has no rales. She exhibits no tenderness.  Abdominal: Soft. Bowel sounds are normal. She exhibits no mass. There is no tenderness. There is no rebound and no guarding.  Musculoskeletal: Normal range of motion. She exhibits no edema.  Lymphadenopathy:    She has no cervical adenopathy.  Neurological: She is alert and oriented to person, place, and time. She has normal reflexes. No cranial nerve deficit.  Skin: Skin is warm and dry. No rash noted.  Psychiatric:  She has a normal mood and affect. Her behavior is normal. Judgment and thought content normal.   BP 118/81  Pulse 92  Temp(Src) 97.1 F (36.2 C) (Oral)  Ht 5' 4.5" (1.638 m)  Wt 138 lb (62.596 kg)  BMI 23.33 kg/m2        Assessment & Plan:  1. Hyperlipidemia - POCT CBC - BMP8+EGFR - Hepatic function panel - NMR, lipoprofile  2. Meniere's disease - POCT CBC - Ambulatory referral to ENT  3. Vitamin D deficiency - Vit D  25 hydroxy (rtn osteoporosis  monitoring)  4. Health care maintenance  5. Hearing loss - Ambulatory referral to ENT  Patient Instructions  Continue current medications. Continue good therapeutic lifestyle changes which include good diet and exercise. Fall precautions discussed with patient. If an FOBT was given today- please return it to our front desk. If you are over 72 years old - you may need Prevnar 60 or the adult Pneumonia vaccine.  We will arrange for you to have an appointment with the ear nose and throat specialist because of the history of Mnire's and hearing loss in the left ear. Continue your exercise program and diet We will call you with your lab work is as those results are available check with your insurance regarding the Prevnar vaccine and the shingles vaccine    Arrie Senate MD

## 2013-07-21 NOTE — Patient Instructions (Addendum)
Continue current medications. Continue good therapeutic lifestyle changes which include good diet and exercise. Fall precautions discussed with patient. If an FOBT was given today- please return it to our front desk. If you are over 51 years old - you may need Prevnar 42 or the adult Pneumonia vaccine.  We will arrange for you to have an appointment with the ear nose and throat specialist because of the history of Mnire's and hearing loss in the left ear. Continue your exercise program and diet We will call you with your lab work is as those results are available check with your insurance regarding the Prevnar vaccine and the shingles vaccine

## 2013-07-22 LAB — NMR, LIPOPROFILE
CHOLESTEROL: 207 mg/dL — AB (ref 100–199)
HDL Cholesterol by NMR: 66 mg/dL (ref >39)
HDL Particle Number: 41.1 umol/L (ref >=30.5)
LDL Particle Number: 1348 nmol/L — ABNORMAL HIGH (ref <1000)
LDL Size: 21.1 nm (ref >20.5)
LDLC SERPL CALC-MCNC: 121 mg/dL — ABNORMAL HIGH (ref 0–99)
LP-IR Score: 39 (ref <=45)
SMALL LDL PARTICLE NUMBER: 400 nmol/L (ref <=527)
TRIGLYCERIDES BY NMR: 98 mg/dL (ref 0–149)

## 2013-07-22 LAB — HEPATIC FUNCTION PANEL
ALT: 14 IU/L (ref 0–32)
AST: 15 IU/L (ref 0–40)
Albumin: 4.3 g/dL (ref 3.5–5.5)
Alkaline Phosphatase: 53 IU/L (ref 39–117)
BILIRUBIN DIRECT: 0.12 mg/dL (ref 0.00–0.40)
TOTAL PROTEIN: 6.5 g/dL (ref 6.0–8.5)
Total Bilirubin: 0.5 mg/dL (ref 0.0–1.2)

## 2013-07-22 LAB — BMP8+EGFR
BUN/Creatinine Ratio: 31 — ABNORMAL HIGH (ref 9–23)
BUN: 14 mg/dL (ref 6–24)
CALCIUM: 8.8 mg/dL (ref 8.7–10.2)
CO2: 26 mmol/L (ref 18–29)
Chloride: 100 mmol/L (ref 97–108)
Creatinine, Ser: 0.45 mg/dL — ABNORMAL LOW (ref 0.57–1.00)
GFR calc Af Amer: 134 mL/min/{1.73_m2} (ref >59)
GFR calc non Af Amer: 116 mL/min/{1.73_m2} (ref >59)
GLUCOSE: 86 mg/dL (ref 65–99)
Potassium: 4 mmol/L (ref 3.5–5.2)
Sodium: 141 mmol/L (ref 134–144)

## 2013-07-22 LAB — VITAMIN D 25 HYDROXY (VIT D DEFICIENCY, FRACTURES): Vit D, 25-Hydroxy: 25.4 ng/mL — ABNORMAL LOW (ref 30.0–100.0)

## 2013-08-19 ENCOUNTER — Other Ambulatory Visit: Payer: Self-pay | Admitting: Family Medicine

## 2014-01-13 ENCOUNTER — Ambulatory Visit: Payer: PRIVATE HEALTH INSURANCE | Admitting: Family Medicine

## 2014-02-04 ENCOUNTER — Other Ambulatory Visit: Payer: Self-pay | Admitting: Family Medicine

## 2014-02-05 NOTE — Telephone Encounter (Signed)
Last seen 07/21/2013, last filled 12/25/2013. [facility] approved

## 2014-02-06 ENCOUNTER — Other Ambulatory Visit: Payer: Self-pay | Admitting: Family Medicine

## 2014-02-06 ENCOUNTER — Ambulatory Visit (INDEPENDENT_AMBULATORY_CARE_PROVIDER_SITE_OTHER): Payer: PRIVATE HEALTH INSURANCE | Admitting: Family Medicine

## 2014-02-06 ENCOUNTER — Encounter: Payer: Self-pay | Admitting: Family Medicine

## 2014-02-06 ENCOUNTER — Ambulatory Visit (INDEPENDENT_AMBULATORY_CARE_PROVIDER_SITE_OTHER): Payer: PRIVATE HEALTH INSURANCE | Admitting: *Deleted

## 2014-02-06 ENCOUNTER — Telehealth: Payer: Self-pay | Admitting: Family Medicine

## 2014-02-06 VITALS — BP 122/76 | HR 83 | Temp 98.3°F | Wt 139.8 lb

## 2014-02-06 DIAGNOSIS — Z23 Encounter for immunization: Secondary | ICD-10-CM

## 2014-02-06 DIAGNOSIS — L03011 Cellulitis of right finger: Secondary | ICD-10-CM

## 2014-02-06 MED ORDER — DOXYCYCLINE HYCLATE 100 MG PO TABS: 100.0000 mg | ORAL_TABLET | Freq: Two times a day (BID) | ORAL | Status: DC

## 2014-02-06 MED ORDER — CLONAZEPAM 0.5 MG PO TABS: ORAL_TABLET | ORAL | Status: DC

## 2014-02-06 NOTE — Progress Notes (Signed)
   Subjective:    Patient ID: [name], female    DOB: 12-17-1960, 53 y.o.   MRN: [id number]  HPI C/o cellulitis or discomfort in her right 3rd and 4th finger.  She needs refills on her clonazepam that she takes for menieres disease.  Review of Systems  Constitutional: Negative for fever.  HENT: Negative for ear pain.   Eyes: Negative for discharge.  Respiratory: Negative for cough.   Cardiovascular: Negative for chest pain.  Gastrointestinal: Negative for abdominal distention.  Endocrine: Negative for polyuria.  Genitourinary: Negative for difficulty urinating.  Musculoskeletal: Negative for gait problem and neck pain.  Skin: Negative for color change and rash.  Neurological: Negative for speech difficulty and headaches.  Psychiatric/Behavioral: Negative for agitation.       Objective:    BP 122/76 mmHg  Pulse 83  Temp(Src) 98.3 F (36.8 C) (Oral)  Wt 139 lb 12.8 oz (63.413 kg) Physical Exam  Constitutional: She is oriented to person, place, and time. She appears well-developed and well-nourished.  HENT:  Head: Normocephalic and atraumatic.  Mouth/Throat: Oropharynx is clear and moist.  Eyes: Pupils are equal, round, and reactive to light.  Neck: Normal range of motion. Neck supple.  Cardiovascular: Normal rate and regular rhythm.   No murmur heard. Pulmonary/Chest: Effort normal and breath sounds normal.  Abdominal: Soft. Bowel sounds are normal. There is no tenderness.  Neurological: She is alert and oriented to person, place, and time.  Skin: Skin is warm and dry.  Swelling and erythema at right 3rd and 4th fingers near cuticles  Psychiatric: She has a normal mood and affect.          Assessment & Plan:     ICD-9-CM ICD-10-CM   1. Cellulitis of finger of right hand 681.00 L03.011 doxycycline (VIBRA-TABS) 100 MG tablet     Return if symptoms worsen or fail to improve.  Lysbeth Penner FNP

## 2014-02-06 NOTE — Telephone Encounter (Signed)
Appt given for today 

## 2014-02-07 ENCOUNTER — Other Ambulatory Visit: Payer: Self-pay | Admitting: Family Medicine

## 2014-02-07 NOTE — Telephone Encounter (Signed)
Pt was seen by Dietrich Pates yesterday and clonazepam was refilled at appointment.

## 2014-02-07 NOTE — Telephone Encounter (Signed)
Phoned in clonazepam

## 2014-02-09 ENCOUNTER — Ambulatory Visit: Payer: PRIVATE HEALTH INSURANCE | Admitting: Family Medicine

## 2014-04-14 ENCOUNTER — Other Ambulatory Visit: Payer: Self-pay

## 2014-04-14 DIAGNOSIS — Z1231 Encounter for screening mammogram for malignant neoplasm of breast: Secondary | ICD-10-CM

## 2014-04-25 ENCOUNTER — Ambulatory Visit: Payer: PRIVATE HEALTH INSURANCE

## 2014-05-02 ENCOUNTER — Ambulatory Visit: Payer: PRIVATE HEALTH INSURANCE

## 2014-05-20 ENCOUNTER — Other Ambulatory Visit: Payer: Self-pay | Admitting: Family Medicine

## 2014-05-22 ENCOUNTER — Telehealth: Payer: Self-pay | Admitting: Family Medicine

## 2014-05-22 MED ORDER — CLONAZEPAM 0.5 MG PO TABS: ORAL_TABLET | ORAL | Status: DC

## 2014-05-22 NOTE — Telephone Encounter (Signed)
Pt aware rx at pharm. °

## 2014-05-24 ENCOUNTER — Ambulatory Visit: Payer: PRIVATE HEALTH INSURANCE

## 2014-07-18 ENCOUNTER — Ambulatory Visit
Admission: RE | Admit: 2014-07-18 | Discharge: 2014-07-18 | Disposition: A | Discharge status: Home / Self Care | Payer: PRIVATE HEALTH INSURANCE | Source: Ambulatory Visit

## 2014-07-18 DIAGNOSIS — Z1231 Encounter for screening mammogram for malignant neoplasm of breast: Secondary | ICD-10-CM

## 2014-07-19 ENCOUNTER — Ambulatory Visit
Admission: RE | Admit: 2014-07-19 | Discharge: 2014-07-19 | Disposition: A | Discharge status: Home / Self Care | Payer: PRIVATE HEALTH INSURANCE | Source: Ambulatory Visit | Attending: Family Medicine | Admitting: Family Medicine

## 2014-07-19 ENCOUNTER — Other Ambulatory Visit: Payer: Self-pay | Admitting: Family Medicine

## 2014-07-19 DIAGNOSIS — R928 Other abnormal and inconclusive findings on diagnostic imaging of breast: Secondary | ICD-10-CM

## 2014-07-20 ENCOUNTER — Other Ambulatory Visit: Payer: Self-pay | Admitting: Family Medicine

## 2014-07-20 ENCOUNTER — Ambulatory Visit
Admission: RE | Admit: 2014-07-20 | Discharge: 2014-07-20 | Disposition: A | Discharge status: Home / Self Care | Payer: PRIVATE HEALTH INSURANCE | Source: Ambulatory Visit | Attending: Family Medicine | Admitting: Family Medicine

## 2014-07-20 DIAGNOSIS — R928 Other abnormal and inconclusive findings on diagnostic imaging of breast: Secondary | ICD-10-CM

## 2014-07-20 DIAGNOSIS — C50919 Malignant neoplasm of unspecified site of unspecified female breast: Secondary | ICD-10-CM

## 2014-07-20 HISTORY — DX: Malignant neoplasm of unspecified site of unspecified female breast: C50.919

## 2014-07-21 ENCOUNTER — Telehealth: Payer: Self-pay

## 2014-07-28 ENCOUNTER — Other Ambulatory Visit: Payer: Self-pay

## 2014-07-30 NOTE — Addendum Note (Signed)
Addended by: Coralie Keens A on: 07/30/2014 07:45 PM   Modules accepted: Orders

## 2014-07-31 ENCOUNTER — Other Ambulatory Visit: Payer: Self-pay | Admitting: Surgery

## 2014-07-31 ENCOUNTER — Encounter: Payer: Self-pay | Admitting: Radiation Oncology

## 2014-07-31 ENCOUNTER — Other Ambulatory Visit: Payer: Self-pay

## 2014-07-31 ENCOUNTER — Ambulatory Visit
Admission: RE | Admit: 2014-07-31 | Discharge: 2014-07-31 | Disposition: A | Discharge status: Home / Self Care | Payer: PRIVATE HEALTH INSURANCE | Source: Ambulatory Visit | Attending: Radiation Oncology | Admitting: Radiation Oncology

## 2014-07-31 ENCOUNTER — Telehealth: Payer: Self-pay | Admitting: *Deleted

## 2014-07-31 VITALS — BP 140/79 | HR 80 | Temp 98.0°F | Resp 16 | Ht 64.5 in | Wt 145.5 lb

## 2014-07-31 DIAGNOSIS — C50412 Malignant neoplasm of upper-outer quadrant of left female breast: Secondary | ICD-10-CM | POA: Insufficient documentation

## 2014-07-31 DIAGNOSIS — C50912 Malignant neoplasm of unspecified site of left female breast: Secondary | ICD-10-CM

## 2014-07-31 DIAGNOSIS — C50411 Malignant neoplasm of upper-outer quadrant of right female breast: Secondary | ICD-10-CM | POA: Insufficient documentation

## 2014-07-31 DIAGNOSIS — Z170 Estrogen receptor positive status [ER+]: Secondary | ICD-10-CM | POA: Insufficient documentation

## 2014-07-31 HISTORY — DX: Malignant neoplasm of unspecified site of unspecified female breast: C50.919

## 2014-07-31 HISTORY — DX: Allergy, unspecified, initial encounter: T78.40XA

## 2014-07-31 NOTE — Telephone Encounter (Signed)
Received referral from [place].  Called pt and confirmed 07/02/14 appt w/ her.  Unable to mail before appt letter - gave verbal.  Unable to mail welcoming packet - gave directions and instructions.  Unable to mail intake form - requested for one to be given at time of check in.  Called and spoke with Alyse Low at [facility] requesting for Breast MRI report to be called in to Dr. Lindi Adie or his nurse & made Terri and Dr. Lindi Adie both aware.  Emailed Engineer, civil (consulting) at [employer] to make her aware.  Placed a copy of records in Dr. Geralyn Flash box and took one to HIM to scan.

## 2014-07-31 NOTE — Progress Notes (Signed)
Radiation Oncology         [phone] ________________________________  Name: [name] MRN: [id number]  Date: 07/31/2014  DOB: 1960/09/28  [place]:OMBTD, Elenore Rota, MD  Coralie Keens, MD     REFERRING PHYSICIAN: Coralie Keens, MD   DIAGNOSIS: The primary encounter diagnosis was Breast cancer, left. A diagnosis of Malignant neoplasm of upper-outer quadrant of left female breast was also pertinent to this visit.    HISTORY OF PRESENT ILLNESS::[name] is a 54 y.o. female who is seen for an initial consultation visit regarding the patient's diagnosis of left breast cancer. The patient has no symptoms or pain in the breast area and notes overall good health. The patient had a biopsy positive for invasive lobular carcinoma, after a 1.3 cm upper outer quadrant tumor was biopsied from the left breast. MRI pending tomorrow.    PREVIOUS RADIATION THERAPY: No   PAST MEDICAL HISTORY:  has a past medical history of Cervical stenosis (uterine cervix); Abnormal Pap smear of cervix; Hyperlipidemia; Dizziness; Insomnia; Nervousness(799.21); Meniere's disease; Benign hemangioma; Allergy; and Breast cancer (07/20/14).     PAST SURGICAL HISTORY: Past Surgical History  Procedure Laterality Date  . Uterine ablation  02/2006     Due to heavy periods Dr. Jon Billings   . Bilateral  tubal ligation  05/08/2004  . Tubal ligation    . Wisdom teeth extractions  yrs ago  . Shoulder open rotator cuff repair Right 07/28/2012    Procedure: RIGHT SHOULDER DECOMPRESSION AND EXCISION OF CALCIFIC DEPOSIT;  Surgeon: Tobi Bastos, MD;  Location: [facility];  Service: Orthopedics;  Laterality: Right;     FAMILY HISTORY: family history includes Cirrhosis in her father; Colon polyps in her mother. There is no history of Colon cancer.   SOCIAL HISTORY:  reports that she has never smoked. She has never used smokeless tobacco. She reports that she does not drink alcohol or use illicit drugs.   ALLERGIES:  Crestor; Lipitor; and Temazepam   MEDICATIONS:  Current Outpatient Prescriptions  Medication Sig Dispense Refill  . clonazePAM (KLONOPIN) 0.5 MG tablet TAKE 1 TABLET THREE TIMES A DAY 90 tablet 1  . doxycycline (VIBRA-TABS) 100 MG tablet Take 1 tablet (100 mg total) by mouth 2 (two) times daily. 20 tablet 0  . meclizine (ANTIVERT) 12.5 MG tablet Take 1 tablet (12.5 mg total) by mouth 3 (three) times daily as needed. 90 tablet 3  . PARoxetine (PAXIL-CR) 25 MG 24 hr tablet TAKE 1 TABLET BY MOUTH EVERY DAY 30 tablet 6  . acetaminophen (TYLENOL) 500 MG tablet Take 500 mg by mouth every 6 (six) hours as needed for pain.    . human chorionic gonadotropin (PREGNYL/NOVAREL) 10000 UNITS injection Inject 35 Units into the muscle once a week.     . Phendimetrazine Tartrate 105 MG CP24 Take by mouth.     No current facility-administered medications for this encounter.     REVIEW OF SYSTEMS:  A 15 point review of systems is documented in the electronic medical record. This was obtained by the nursing staff. However, I reviewed this with the patient to discuss relevant findings and make appropriate changes.  Pertinent items are noted in HPI.    PHYSICAL EXAM:  height is 5' 4.5" (1.638 m) and weight is 145 lb 8 oz (65.998 kg). Her oral temperature is 98 F (36.7 C). Her blood pressure is 140/79 and her pulse is 80. Her respiration is 16.   ECOG = 0  0 - Asymptomatic (Fully active,  able to carry on all predisease activities without restriction)  1 - Symptomatic but completely ambulatory (Restricted in physically strenuous activity but ambulatory and able to carry out work of a light or sedentary nature. For example, light housework, office work)  2 - Symptomatic, <50% in bed during the day (Ambulatory and capable of all self care but unable to carry out any work activities. Up and about more than 50% of waking hours)  3 - Symptomatic, >50% in bed, but not bedbound (Capable of only limited self-care,  confined to bed or chair 50% or more of waking hours)  4 - Bedbound (Completely disabled. Cannot carry on any self-care. Totally confined to bed or chair)  5 - Death   Eustace Pen MM, Creech RH, Tormey DC, et al. [phone]). "Toxicity and response criteria of the [facility]". [place] Oncol. 5 (6): 649-55   General: Well-developed, in no acute distress HEENT: Normocephalic, atraumatic; oral cavity clear Neck: Supple without any lymphadenopathy Cardiovascular: Regular rate and rhythm Respiratory: Clear to auscultation bilaterally GI: Soft, nontender, normal bowel sounds Extremities: No edema present Neuro: No focal deficits Breast: Palpable mass in upper outer quadrant in left breast and no axillary adenopathy on left. Unremarkable exam for right breast.     LABORATORY DATA:  Lab Results  Component Value Date   WBC 5.5 07/21/2013   HGB 12.9 07/21/2013   HCT 40.4 07/21/2013   MCV 92.9 07/21/2013   PLT 280 07/26/2012   Lab Results  Component Value Date   NA 141 07/21/2013   K 4.0 07/21/2013   CL 100 07/21/2013   CO2 26 07/21/2013   Lab Results  Component Value Date   ALT 14 07/21/2013   AST 15 07/21/2013   ALKPHOS 53 07/21/2013   BILITOT 0.5 07/21/2013      RADIOGRAPHY: Mm Digital Diagnostic Unilat L  07/20/2014   CLINICAL DATA:  Suspicious left breast mass status post ultrasound-guided core needle biopsy.  EXAM: DIAGNOSTIC LEFT MAMMOGRAM POST ULTRASOUND BIOPSY  COMPARISON:  Previous exam(s).  FINDINGS: Mammographic images were obtained following ultrasound guided biopsy of left breast mass, 2 o'clock position. Ribbon shaped marking clip in appropriate position  IMPRESSION: Appropriate position ribbon shaped marking clip status post ultrasound-guided core needle biopsy left breast mass.  Final Assessment: Post Procedure Mammograms for Marker Placement   Electronically Signed   By: Lovey Newcomer M.D.   On: 07/20/2014 10:19   US Breast Ltd Uni Left Inc  Axilla  07/19/2014   CLINICAL DATA:  Screening callback for questioned left breast upper outer quadrant distortion  EXAM: DIGITAL DIAGNOSTIC left MAMMOGRAM WITH 3D TOMOSYNTHESIS WITH CAD  ULTRASOUND left BREAST  COMPARISON:  Prior exams  ACR Breast Density Category c: The breast tissue is heterogeneously dense, which may obscure small masses.  FINDINGS: There is persistent distortion in the left breast upper outer quadrant on additional imaging.  Mammographic images were processed with CAD.  On physical exam, I palpate no abnormality in the left breast upper outer quadrant.  Targeted ultrasound is performed, showing a hypoechoic irregular spiculated mass left breast 2 o'clock location 4 cm from the nipple measuring 1.3 x 0.8 x 0.8 cm. No left axillary lymphadenopathy is identified.  IMPRESSION: Suspicious mass left breast 2 o'clock location. Ultrasound-guided core biopsy has been scheduled 07/20/2014 at 9 a.m.  RECOMMENDATION: Left ultrasound-guided core biopsy  I have discussed the findings and recommendations with the patient. Results were also provided in writing at the conclusion of the visit.  If applicable, a reminder letter will be sent to the patient regarding the next appointment.  BI-RADS CATEGORY  4: Suspicious.   Electronically Signed   By: Conchita Paris M.D.   On: 07/19/2014 15:56   Mm Diag Breast Tomo Uni Left  07/19/2014   CLINICAL DATA:  Screening callback for questioned left breast upper outer quadrant distortion  EXAM: DIGITAL DIAGNOSTIC left MAMMOGRAM WITH 3D TOMOSYNTHESIS WITH CAD  ULTRASOUND left BREAST  COMPARISON:  Prior exams  ACR Breast Density Category c: The breast tissue is heterogeneously dense, which may obscure small masses.  FINDINGS: There is persistent distortion in the left breast upper outer quadrant on additional imaging.  Mammographic images were processed with CAD.  On physical exam, I palpate no abnormality in the left breast upper outer quadrant.  Targeted ultrasound is  performed, showing a hypoechoic irregular spiculated mass left breast 2 o'clock location 4 cm from the nipple measuring 1.3 x 0.8 x 0.8 cm. No left axillary lymphadenopathy is identified.  IMPRESSION: Suspicious mass left breast 2 o'clock location. Ultrasound-guided core biopsy has been scheduled 07/20/2014 at 9 a.m.  RECOMMENDATION: Left ultrasound-guided core biopsy  I have discussed the findings and recommendations with the patient. Results were also provided in writing at the conclusion of the visit. If applicable, a reminder letter will be sent to the patient regarding the next appointment.  BI-RADS CATEGORY  4: Suspicious.   Electronically Signed   By: Conchita Paris M.D.   On: 07/19/2014 15:56   Mm Screening Breast Tomo Bilateral  07/18/2014   CLINICAL DATA:  Screening.  EXAM: DIGITAL SCREENING BILATERAL MAMMOGRAM WITH 3D TOMO WITH CAD  COMPARISON:  Previous exam(s).  ACR Breast Density Category c: The breast tissue is heterogeneously dense, which may obscure small masses.  FINDINGS: In the left breast, possible distortion warrants further evaluation. In the right breast, no findings suspicious for malignancy. Images were processed with CAD.  IMPRESSION: Further evaluation is suggested for possible distortion in the left breast.  RECOMMENDATION: Diagnostic mammogram and possibly ultrasound of the left breast. (Code:[id number])  The patient will be contacted regarding the findings, and additional imaging will be scheduled.  BI-RADS CATEGORY  0: Incomplete. Need additional imaging evaluation and/or prior mammograms for comparison.   Electronically Signed   By: Lovey Newcomer M.D.   On: 07/18/2014 15:04   [place] Lt Breast Bx W Loc Dev 1st Lesion Img Bx Spec US Guide  07/21/2014   ADDENDUM REPORT: 07/21/2014 18:42  ADDENDUM: The final pathological diagnosis is grade 1 invasive mammary carcinoma and invasive mammary carcinoma in situ. This is concordant with the imaging findings. Surgical consultation is  recommended. Also recommended is consideration of preoperative breast MRI due to mammographically dense breast tissue.  The final pathological diagnosis and recommendation were discussed with the patient by telephone on 07/21/2014. Her questions were answered. She reported pain at the biopsy site rated 6/10. She reported no bruising or palpable hematoma. She was given an appointment with Dr. Ninfa Linden at 9:15 a.m. on 07/28/2014.   Electronically Signed   By: Claudie Revering M.D.   On: 07/21/2014 18:42   07/21/2014   CLINICAL DATA:  Patient with suspicious left breast mass.  EXAM: ULTRASOUND GUIDED LEFT BREAST CORE NEEDLE BIOPSY  COMPARISON:  Previous exam(s).  PROCEDURE: I met with the patient and we discussed the procedure of ultrasound-guided biopsy, including benefits and alternatives. We discussed the high likelihood of a successful procedure. We discussed the risks of the procedure including infection, bleeding,  tissue injury, clip migration, and inadequate sampling. Informed written consent was given. The usual time-out protocol was performed immediately prior to the procedure.  Using sterile technique and 2% Lidocaine as local anesthetic, under direct ultrasound visualization, a 12 gauge vacuum-assisted device was used to perform biopsy of left breast mass, 2 o'clock positionusing a lateral approach. At the conclusion of the procedure, a ribbon shaped tissue marker clip was deployed into the biopsy cavity. Follow-up 2-view mammogram was performed and dictated separately.  IMPRESSION: Ultrasound-guided biopsy of left breast mass, 2 o'clock position. No apparent complications.  Electronically Signed: By: Lovey Newcomer M.D. On: 07/20/2014 10:18       IMPRESSION: T1cN0M0 ILC left breast, ER +, PR +, Her-2/neu -. Patient with left breast cancer would benefit from radiation treatment following lumpectomy.    PLAN: Patient to have MRI scan tomorrow. Patient to schedule a lumpectomy with Dr. Ninfa Linden. Discussed  with patient that radiation treatment would follow one month after lumpectomy. Discussed radiation planning and 6.5 week course of radiation treatment. Patient notes needing to attend a company meeting in [place] during the week of August 15th, discussed day to day treatment planning to make up for missed treatments.    I spent 60 minutes face to face with the patient and more than 50% of that time was spent in counseling and/or coordination of care.    ________________________________   Jodelle Gross, MD, PhD   **Disclaimer: This note was dictated with voice recognition software. Similar sounding words can inadvertently be transcribed and this note may contain transcription errors which may not have been corrected upon publication of note.**  This document serves as a record of services personally performed by Kyung Rudd, MD. It was created on his behalf by Derek Mound, a trained medical scribe. The creation of this record is based on the scribe's personal observations and the provider's statements to them. This document has been checked and approved by the attending provider.

## 2014-07-31 NOTE — Progress Notes (Addendum)
Location of Breast Cancer:Left Breast  Histology per Pathology Report: Diagnosis 07/20/14: Breast, left, needle core biopsy, 2 o'clock- INVASIVE MAMMARY CARCINOMA.- MAMMARY CARCINOMA IN SITU.  Receptor Status: ER(+86%), PR ( +78%), Her2-neu ( neg KI-67 14%)  Did patient present with symptoms (if so, please note symptoms) or was this found on screening mammography?: screening mammogram  Past/Anticipated interventions by surgeon, if any:Dr. Harvie Bridge MRI scheduled 08/01/14 @ 830 am,  then discuss surgery options   t Chemotherapy = Dr. Lindi Adie 08/01/14  Lymphedema issues, if any:   NO  Pain issues, if any: NO  SAFETY ISSUES:No  Prior radiation? NO  Pacemaker/ICD? NO  Possible current pregnancy? NO  Is the patient on methotrexate? NO  Current Complaints / other details: menarche age 55,G2P2, hx oral contraceptives, maternal  great grandmother and maternal grandmother =breast cancer, maternal aunt cancer,started in kidney and metastasized  deceased  Allergies: Crestor,Lipitor,Temazapam,=intolerance Rebecca Eaton, RN 07/31/2014,7:22 AM

## 2014-07-31 NOTE — Telephone Encounter (Signed)
Duplicate

## 2014-07-31 NOTE — Progress Notes (Signed)
Please see the Nurse Progress Note in the MD Initial Consult Encounter for this patient. 

## 2014-08-01 ENCOUNTER — Encounter: Payer: Self-pay | Admitting: *Deleted

## 2014-08-01 ENCOUNTER — Ambulatory Visit (HOSPITAL_BASED_OUTPATIENT_CLINIC_OR_DEPARTMENT_OTHER): Payer: PRIVATE HEALTH INSURANCE | Admitting: Hematology and Oncology

## 2014-08-01 ENCOUNTER — Inpatient Hospital Stay: Admission: RE | Admit: 2014-08-01 | Payer: PRIVATE HEALTH INSURANCE | Source: Ambulatory Visit

## 2014-08-01 ENCOUNTER — Ambulatory Visit
Admission: RE | Admit: 2014-08-01 | Discharge: 2014-08-01 | Disposition: A | Discharge status: Home / Self Care | Payer: PRIVATE HEALTH INSURANCE | Source: Ambulatory Visit | Attending: Surgery | Admitting: Surgery

## 2014-08-01 ENCOUNTER — Other Ambulatory Visit: Payer: Self-pay | Admitting: Surgery

## 2014-08-01 ENCOUNTER — Ambulatory Visit: Payer: PRIVATE HEALTH INSURANCE

## 2014-08-01 VITALS — BP 134/84 | HR 95 | Temp 98.4°F | Resp 18 | Ht 64.5 in | Wt 144.4 lb

## 2014-08-01 DIAGNOSIS — Z170 Estrogen receptor positive status [ER+]: Secondary | ICD-10-CM | POA: Diagnosis not present

## 2014-08-01 DIAGNOSIS — C50912 Malignant neoplasm of unspecified site of left female breast: Secondary | ICD-10-CM

## 2014-08-01 DIAGNOSIS — C50412 Malignant neoplasm of upper-outer quadrant of left female breast: Secondary | ICD-10-CM

## 2014-08-01 MED ORDER — GADOBENATE DIMEGLUMINE 529 MG/ML IV SOLN: 10.0000 mL | Freq: Once | INTRAVENOUS | Status: AC | PRN

## 2014-08-01 NOTE — Progress Notes (Signed)
[facility] CONSULT NOTE  Patient Care Team: Chipper Herb, MD as PCP - General (Family Medicine)  CHIEF COMPLAINTS/PURPOSE OF CONSULTATION:  Newly diagnosed breast cancer  HISTORY OF PRESENTING ILLNESS:  [name] 54 y.o. female is here because of recent diagnosis of left breast cancer. She had a routine screening mammogram on 07/18/2014 which showed distortion of the left breast. This was followed by ultrasound and biopsy that showed a 1.3 cm mass in the left breast. 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 She underwent a breast MRI today which showed a 3.9 similar area of non-mass enhancement. This was surrounding the area of the biopsy. Patient is here accompanied by her husband. She is very anxious to get the surgery done so she can go on to the next phase of her treatment. 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  I reviewed her records extensively and collaborated the history with the patient.  SUMMARY OF ONCOLOGIC HISTORY:   Breast cancer of upper-outer quadrant of left female breast   07/18/2014 Mammogram Distortion left breast, breast density category C; U/S 1.3 x 0.8 x 0.8 cm left breast mass at 2:00 position 4 cm from nipple, no lymph nodes   07/20/2014 Initial Diagnosis Left breast Biopsy: Invasive lobular cancer with LCIS, Grade 1, ER 86%, PR 78%, Her 2 Neg Ratio 1.77, Ki 67: 14%   08/01/2014 Breast MRI Breast MRI showed non-mass enhancement 3.9 cm, no lymph nodes    MEDICAL HISTORY:  Past Medical History  Diagnosis Date  . Cervical stenosis (uterine cervix)   . Abnormal Pap smear of cervix     Dr. Eilleen Kempf   . Hyperlipidemia   . Dizziness   . Insomnia   . Nervousness(799.21)   . Meniere's disease   . Benign hemangioma     Dr,  Marybelle Killings   . Allergy   . Breast cancer 07/20/14    Left Breast    SURGICAL HISTORY: Past  Surgical History  Procedure Laterality Date  . Uterine ablation  02/2006     Due to heavy periods Dr. Jon Billings   . Bilateral  tubal ligation  05/08/2004  . Tubal ligation    . Wisdom teeth extractions  yrs ago  . Shoulder open rotator cuff repair Right 07/28/2012    Procedure: RIGHT SHOULDER DECOMPRESSION AND EXCISION OF CALCIFIC DEPOSIT;  Surgeon: Tobi Bastos, MD;  Location: [facility];  Service: Orthopedics;  Laterality: Right;    SOCIAL HISTORY: History   Social History  . Marital Status: Married    Spouse Name: N/A  . Number of Children: N/A  . Years of Education: N/A   Occupational History  . Not on file.   Social History Main Topics  . Smoking status: Never Smoker   . Smokeless tobacco: Never Used  . Alcohol Use: No     Comment: very rare  . Drug Use: No  . Sexual Activity: Not on file   Other Topics Concern  . Not on file   Social History Narrative    FAMILY HISTORY: Family History  Problem Relation Age of Onset  . Cirrhosis Father   . Colon polyps Mother   . Colon cancer Neg Hx     ALLERGIES:  is allergic to crestor; lipitor; and temazepam.  MEDICATIONS:  Current Outpatient Prescriptions  Medication Sig Dispense Refill  . acetaminophen (  TYLENOL) 500 MG tablet Take 500 mg by mouth every 6 (six) hours as needed for pain.    . clonazePAM (KLONOPIN) 0.5 MG tablet TAKE 1 TABLET THREE TIMES A DAY 90 tablet 1  . doxycycline (VIBRA-TABS) 100 MG tablet Take 1 tablet (100 mg total) by mouth 2 (two) times daily. 20 tablet 0  . human chorionic gonadotropin (PREGNYL/NOVAREL) 10000 UNITS injection Inject 35 Units into the muscle once a week.     . meclizine (ANTIVERT) 12.5 MG tablet Take 1 tablet (12.5 mg total) by mouth 3 (three) times daily as needed. 90 tablet 3  . PARoxetine (PAXIL-CR) 25 MG 24 hr tablet TAKE 1 TABLET BY MOUTH EVERY DAY 30 tablet 6  . Phendimetrazine Tartrate 105 MG CP24 Take by mouth.     No current facility-administered medications for this  visit.   Facility-Administered Medications Ordered in Other Visits  Medication Dose Route Frequency Provider Last Rate Last Dose  . gadobenate dimeglumine (MULTIHANCE) injection 10 mL  10 mL Intravenous Once PRN Medication Radiologist, MD        REVIEW OF SYSTEMS:   Constitutional: Denies fevers, chills or abnormal night sweats Eyes: Denies blurriness of vision, double vision or watery eyes Ears, nose, mouth, throat, and face: Denies mucositis or sore throat Respiratory: Denies cough, dyspnea or wheezes Cardiovascular: Denies palpitation, chest discomfort or lower extremity swelling Gastrointestinal:  Denies nausea, heartburn or change in bowel habits Skin: Denies abnormal skin rashes Lymphatics: Denies new lymphadenopathy or easy bruising Neurological:Denies numbness, tingling or new weaknesses Behavioral/Psych: Mood is stable, no new changes  Breast:  Denies any palpable lumps or discharge All other systems were reviewed with the patient and are negative.  PHYSICAL EXAMINATION: ECOG PERFORMANCE STATUS: 0 - Asymptomatic  Filed Vitals:   08/01/14 1429  BP: 134/84  Pulse: 95  Temp: 98.4 F (36.9 C)  Resp: 18   Filed Weights   08/01/14 1429  Weight: 144 lb 6.4 oz (65.499 kg)    GENERAL:alert, no distress and comfortable SKIN: skin color, texture, turgor are normal, no rashes or significant lesions EYES: normal, conjunctiva are pink and non-injected, sclera clear OROPHARYNX:no exudate, no erythema and lips, buccal mucosa, and tongue normal  NECK: supple, thyroid normal size, non-tender, without nodularity LYMPH:  no palpable lymphadenopathy in the cervical, axillary or inguinal LUNGS: clear to auscultation and percussion with normal breathing effort HEART: regular rate & rhythm and no murmurs and no lower extremity edema ABDOMEN:abdomen soft, non-tender and normal bowel sounds Musculoskeletal:no cyanosis of digits and no clubbing  PSYCH: alert & oriented x 3 with fluent  speech NEURO: no focal motor/sensory deficits BREAST:*the area where she had biopsy is palpated and there is nodularity probably related to hematoma.(exam performed in the presence of a chaperone)   LABORATORY DATA:  I have reviewed the data as listed Lab Results  Component Value Date   WBC 5.5 07/21/2013   HGB 12.9 07/21/2013   HCT 40.4 07/21/2013   MCV 92.9 07/21/2013   PLT 280 07/26/2012   Lab Results  Component Value Date   NA 141 07/21/2013   K 4.0 07/21/2013   CL 100 07/21/2013   CO2 26 07/21/2013    RADIOGRAPHIC STUDIES: I have personally reviewed the radiological reports and agreed with the findings in the report.results are summarized as above  ASSESSMENT AND PLAN:  Breast cancer of upper-outer quadrant of left female breast Left breast Biopsy 07/20/2014: Invasive lobular cancer with LCIS, Grade 1, ER 86%, PR 78%, Her 2 Neg  Ratio 1.77, Ki 67: 14%; mammogram and ultrasound revealed a 1.3 x 0.8 x 0.8 cm mass, T1 cN0 stage IA clinical stage  Pathology and radiology counseling:Discussed with the patient, the details of pathology including the type of breast cancer,the clinical staging, the significance of ER, PR and HER-2/neu receptors and the implications for treatment. After reviewing the pathology in detail, we proceeded to discuss the different treatment options between surgery, radiation, chemotherapy, antiestrogen therapies. Breast MRI: 08/01/2014, colon non-mass enhancement measuring 3.9 cm surrounding the area where she underwent a biopsy. Radiologist felt that there is no need to rebiopsy.  Recommendations: 1. Breast conserving surgery followed by 2. Oncotype DX testing to determine if chemotherapy would be of any benefit followed by 3. Adjuvant radiation therapy followed by 4. Adjuvant antiestrogen therapy  Oncotype counseling: I discussed Oncotype DX test. I explained to the patient that this is a 21 gene panel to evaluate patient tumors DNA to calculate  recurrence score. This would help determine whether patient has high risk or intermediate risk or low risk breast cancer. She understands that if her tumor was found to be high risk, she would benefit from systemic chemotherapy. If low risk, no need of chemotherapy. If she was found to be intermediate risk, we would need to evaluate the score as well as other risk factors and determine if an abbreviated chemotherapy may be of benefit.  Return to clinic after surgery to discuss final pathology report and then determine if Oncotype DX testing will need to be sent.  All questions were answered. The patient knows to call the clinic with any problems, questions or concerns.    Rulon Eisenmenger, MD 5:28 PM

## 2014-08-01 NOTE — Progress Notes (Signed)
Met with pt during new pt visit with Dr. Lindi Adie. Gave navigation resources, journey book, and contact information. Discussed care plan summary. Encourage pt to call with questions or concerns. Received verbal understanding.

## 2014-08-01 NOTE — Assessment & Plan Note (Signed)
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 After reviewing the pathology in detail, we proceeded to discuss the different treatment options between surgery, radiation, chemotherapy, antiestrogen therapies.  Recommendations: 1. Breast conserving surgery followed by 2. Oncotype DX testing to determine if chemotherapy would be of any benefit followed by 3. Adjuvant radiation therapy followed by 4. Adjuvant antiestrogen therapy  Oncotype counseling: I discussed Oncotype DX test. I explained to the patient that this is a 21 gene panel to evaluate patient tumors DNA to calculate recurrence score. This would help determine whether patient has high risk or intermediate risk or low risk breast cancer. She understands that if her tumor was found to be high risk, she would benefit from systemic chemotherapy. If low risk, no need of chemotherapy. If she was found to be intermediate risk, we would need to evaluate the score as well as other risk factors and determine if an abbreviated chemotherapy may be of benefit.  Return to clinic after surgery to discuss final pathology report and then determine if Oncotype DX testing will need to be sent.

## 2014-08-02 ENCOUNTER — Encounter (HOSPITAL_BASED_OUTPATIENT_CLINIC_OR_DEPARTMENT_OTHER): Payer: Self-pay | Admitting: *Deleted

## 2014-08-02 NOTE — Addendum Note (Signed)
Addended by: Renford Dills on: 08/02/2014 08:09 AM   Modules accepted: Orders, Medications

## 2014-08-02 NOTE — Progress Notes (Signed)
Note created by Dr. Gudena during office visit. Copy to patient, original to scan. 

## 2014-08-03 ENCOUNTER — Telehealth: Payer: Self-pay | Admitting: Hematology and Oncology

## 2014-08-03 ENCOUNTER — Ambulatory Visit: Payer: PRIVATE HEALTH INSURANCE | Admitting: Radiation Oncology

## 2014-08-03 ENCOUNTER — Encounter: Payer: Self-pay | Admitting: *Deleted

## 2014-08-03 NOTE — Telephone Encounter (Signed)
lvm for pt regarding ot JUNE appt....mailed pt appt sched and letter

## 2014-08-03 NOTE — Telephone Encounter (Signed)
added appt per pof...mailed pt appt sched and letter

## 2014-08-07 NOTE — H&P (Signed)
[name] 07/28/2014 10:16 AM Location: [facility] Patient #: [id number] DOB: 31-Aug-1960 Married / Language: [name] / Race: White Female  History of Present Illness (Jade Lyday A. Ninfa Linden MD; 07/28/2014 10:50 AM) The patient is a 54 year old female who presents with breast cancer. This is a pleasant female referred by Dr. Morrie Miller after the recent diagnosis of a left breast cancer. This was found on screening mammography. She has since had an biopsy showing an invasive mammary cancer which is ER and PR positive. She has no previous history of problems with her breasts. She has a family history of breast cancer and a great-grandmother and grandmother. She has no nipple discharge. She is otherwise healthy without complaints.   Other Problems [name], CMA; 07/28/2014 10:17 AM) Breast Cancer Lump In Breast Other disease, cancer, significant illness  Past Surgical History [name], CMA; 07/28/2014 10:17 AM) Breast Biopsy Left. Oral Surgery Shoulder Surgery Right.  Diagnostic Studies History [name], [place]; 07/28/2014 10:17 AM) Colonoscopy 1-5 years ago Mammogram within last year Pap Smear 1-5 years ago  Allergies [name], CMA; 07/28/2014 10:18 AM) Crestor *ANTIHYPERLIPIDEMICS* Lipitor *ANTIHYPERLIPIDEMICS* Temazepam *HYPNOTICS/SEDATIVES/SLEEP DISORDER AGENTS*  Medication History [name], CMA; 07/28/2014 10:20 AM) ClonazePAM (0.'5MG'$  Tablet, Oral) Active. Celecoxib ('200MG'$  Capsule, Oral) Active. PARoxetine HCl ER ('25MG'$  Tablet ER 24HR, Oral) Active. Doxycycline Monohydrate ('100MG'$  Tablet, Oral) Active. Tylenol Extra Strength ('500MG'$  Tablet, Oral) Active. Phendimetrazine Tartrate ('105MG'$  Capsule ER, Oral) Active. Medications Reconciled  Social History [name], [place]; 07/28/2014 10:17 AM) Alcohol use Occasional alcohol use. No caffeine use No drug use Tobacco use Never smoker.  Family History [name], [place]; 07/28/2014  10:17 AM) Alcohol Abuse Father. Hypertension Mother. Seizure disorder Mother.  Pregnancy / Birth History [name], CMA; 07/28/2014 10:17 AM) Age at menarche 69 years. Contraceptive History Oral contraceptives. Gravida 2 Maternal age >9 Para 2 Regular periods  Review of Systems [name] CMA; 07/28/2014 10:17 AM) General Not Present- Appetite Loss, Chills, Fatigue, Fever, Night Sweats, Weight Gain and Weight Loss. Skin Not Present- Change in Wart/Mole, Dryness, Hives, Jaundice, New Lesions, Non-Healing Wounds, Rash and Ulcer. HEENT Present- Hearing Loss and Wears glasses/contact lenses. Not Present- Earache, Hoarseness, Nose Bleed, Oral Ulcers, Ringing in the Ears, Seasonal Allergies, Sinus Pain, Sore Throat, Visual Disturbances and Yellow Eyes. Respiratory Present- Snoring. Not Present- Bloody sputum, Chronic Cough, Difficulty Breathing and Wheezing. Breast Present- Breast Mass. Not Present- Breast Pain, Nipple Discharge and Skin Changes. Cardiovascular Not Present- Chest Pain, Difficulty Breathing Lying Down, Leg Cramps, Palpitations, Rapid Heart Rate, Shortness of Breath and Swelling of Extremities. Gastrointestinal Not Present- Abdominal Pain, Bloating, Bloody Stool, Change in Bowel Habits, Chronic diarrhea, Constipation, Difficulty Swallowing, Excessive gas, Gets full quickly at meals, Hemorrhoids, Indigestion, Nausea, Rectal Pain and Vomiting. Female Genitourinary Not Present- Frequency, Nocturia, Painful Urination, Pelvic Pain and Urgency. Musculoskeletal Not Present- Back Pain, Joint Pain, Joint Stiffness, Muscle Pain, Muscle Weakness and Swelling of Extremities. Neurological Not Present- Decreased Memory, Fainting, Headaches, Numbness, Seizures, Tingling, Tremor, Trouble walking and Weakness. Psychiatric Not Present- Anxiety, Bipolar, Change in Sleep Pattern, Depression, Fearful and Frequent crying. Endocrine Not Present- Cold Intolerance, Excessive Hunger, Hair  Changes, Heat Intolerance, Hot flashes and New Diabetes. Hematology Not Present- Easy Bruising, Excessive bleeding, Gland problems, HIV and Persistent Infections.   Vitals [name] CMA; 07/28/2014 10:20 AM) 07/28/2014 10:20 AM Weight: 145 lb Height: 65in Body Surface Area: 1.74 m Body Mass Index: 24.13 kg/m Temp.: 98.6F(Oral)  Pulse: 85 (Regular)  Resp.: 17 (Unlabored)  BP: 128/72 (  Sitting, Left Arm, Standard)    Physical Exam (Jade Hippe A. Ninfa Linden MD; 07/28/2014 10:53 AM) General Mental Status-Alert. General Appearance-Consistent with stated age. Hydration-Well hydrated. Voice-Normal.  Head and Neck Head-normocephalic, atraumatic with no lesions or palpable masses. Trachea-midline. Thyroid Gland Characteristics - normal size and consistency.  Eye Eyeball - Bilateral-Extraocular movements intact. Sclera/Conjunctiva - Bilateral-No scleral icterus.  Chest and Lung Exam Chest and lung exam reveals -quiet, even and easy respiratory effort with no use of accessory muscles and on auscultation, normal breath sounds, no adventitious sounds and normal vocal resonance. Inspection Chest Wall - Normal. Back - normal.  Breast Breast - Left-Symmetric, Non Tender, No Biopsy scars, no Dimpling, No Inflammation, No Lumpectomy scars, No Mastectomy scars, No Peau d' Orange. Breast - Right-Symmetric, Non Tender, No Biopsy scars, no Dimpling, No Inflammation, No Lumpectomy scars, No Mastectomy scars, No Peau d' Orange. Breast Lump-No Palpable Breast Mass.  Cardiovascular Cardiovascular examination reveals -normal heart sounds, regular rate and rhythm with no murmurs and normal pedal pulses bilaterally.  Abdomen Inspection Inspection of the abdomen reveals - No Hernias. Skin - Scar - no surgical scars. Palpation/Percussion Palpation and Percussion of the abdomen reveal - Soft, Non Tender, No Rebound tenderness, No Rigidity (guarding) and No  hepatosplenomegaly. Auscultation Auscultation of the abdomen reveals - Bowel sounds normal.  Neurologic - Did not examine.  Musculoskeletal - Did not examine.  Lymphatic Head & Neck  General Head & Neck Lymphatics: Bilateral - Description - Normal. Axillary  General Axillary Region: Bilateral - Description - Normal. Tenderness - Non Tender. Femoral & Inguinal  Generalized Femoral & Inguinal Lymphatics: Bilateral - Description - Normal. Tenderness - Non Tender.    Assessment & Plan (Pearlean Sabina A. Ninfa Linden MD; 07/28/2014 10:54 AM) BREAST CANCER, LEFT (174.9  C50.912) Impression: She has been presented in breast conference. Because the tumor was larger on ultrasound and on mammogram, MRI has been strongly recommended to truly evaluate the size prior to proceeding with surgery. I discussed breast conservation with her first mastectomy. She does want to proceed with breast conservation which would be a radioactive seed partial mastectomy with sentinel lymph node biopsy. We will refer her to the cancer center for recommendations as well. I will call her back after the MRI is done so we can plan things further  Addendum:  The MRI showed the left breast mass area of enhancement to be approximately 3.9 cm.  We again discussed neoadjuvant therapy to shrink the tumor vs an attempt at lumpectomy.  She wants to proceed with a radioactive seed localized left breast partial mastectomy (lumpectomy) and sentinel node biopsy.  The risks including but not limited to bleeding, infection, having positive margins, need for further surgery, injury to surrounding structures, DVT, need for post op radiation, etc.  She agrees to proceed with surgery     Signed by Harl Bowie, MD (07/28/2014 10:55 AM)

## 2014-08-07 NOTE — Anesthesia Preprocedure Evaluation (Addendum)
Anesthesia Evaluation  Patient identified by MRN, date of birth, ID band Patient awake    Reviewed: Allergy & Precautions, H&P , NPO status , Patient's Chart, lab work & pertinent test results  Airway Mallampati: I  TM Distance: >3 FB Neck ROM: Full    Dental  (+) Teeth Intact, Dental Advisory Given   Pulmonary neg pulmonary ROS,  breath sounds clear to auscultation  Pulmonary exam normal       Cardiovascular negative cardio ROS Normal cardiovascular examRhythm:Regular Rate:Normal     Neuro/Psych PSYCHIATRIC DISORDERS Anxiety negative neurological ROS     GI/Hepatic negative GI ROS, Neg liver ROS,   Endo/Other  negative endocrine ROS  Renal/GU negative Renal ROS     Musculoskeletal negative musculoskeletal ROS (+)   Abdominal   Peds  Hematology negative hematology ROS (+)   Anesthesia Other Findings   Reproductive/Obstetrics                           Anesthesia Physical  Anesthesia Plan  ASA: II  Anesthesia Plan: General and Regional   Post-op Pain Management:    Induction: Intravenous  Airway Management Planned: LMA  Additional Equipment:   Intra-op Plan:   Post-operative Plan: Extubation in OR  Informed Consent: I have reviewed the patients History and Physical, chart, labs and discussed the procedure including the risks, benefits and alternatives for the proposed anesthesia with the patient or authorized representative who has indicated his/her understanding and acceptance.   Dental advisory given  Plan Discussed with: CRNA, Anesthesiologist and Surgeon  Anesthesia Plan Comments:        Anesthesia Quick Evaluation

## 2014-08-08 ENCOUNTER — Ambulatory Visit (HOSPITAL_BASED_OUTPATIENT_CLINIC_OR_DEPARTMENT_OTHER): Payer: PRIVATE HEALTH INSURANCE | Admitting: Anesthesiology

## 2014-08-08 ENCOUNTER — Encounter (HOSPITAL_BASED_OUTPATIENT_CLINIC_OR_DEPARTMENT_OTHER)
Admission: RE | Disposition: A | Discharge status: Home / Self Care | Payer: Self-pay | Source: Ambulatory Visit | Attending: Surgery

## 2014-08-08 ENCOUNTER — Encounter (HOSPITAL_COMMUNITY)
Admission: RE | Admit: 2014-08-08 | Discharge: 2014-08-08 | Disposition: A | Discharge status: Home / Self Care | Payer: PRIVATE HEALTH INSURANCE | Source: Ambulatory Visit | Attending: Surgery | Admitting: Surgery

## 2014-08-08 ENCOUNTER — Encounter (HOSPITAL_BASED_OUTPATIENT_CLINIC_OR_DEPARTMENT_OTHER): Payer: Self-pay | Admitting: *Deleted

## 2014-08-08 ENCOUNTER — Ambulatory Visit (HOSPITAL_BASED_OUTPATIENT_CLINIC_OR_DEPARTMENT_OTHER)
Admission: RE | Admit: 2014-08-08 | Discharge: 2014-08-08 | Disposition: A | Discharge status: Home / Self Care | Payer: PRIVATE HEALTH INSURANCE | Source: Ambulatory Visit | Attending: Surgery | Admitting: Surgery

## 2014-08-08 DIAGNOSIS — C773 Secondary and unspecified malignant neoplasm of axilla and upper limb lymph nodes: Secondary | ICD-10-CM | POA: Diagnosis not present

## 2014-08-08 DIAGNOSIS — C50912 Malignant neoplasm of unspecified site of left female breast: Secondary | ICD-10-CM | POA: Insufficient documentation

## 2014-08-08 DIAGNOSIS — Z803 Family history of malignant neoplasm of breast: Secondary | ICD-10-CM | POA: Diagnosis not present

## 2014-08-08 DIAGNOSIS — Z79899 Other long term (current) drug therapy: Secondary | ICD-10-CM | POA: Diagnosis not present

## 2014-08-08 DIAGNOSIS — Z170 Estrogen receptor positive status [ER+]: Secondary | ICD-10-CM | POA: Diagnosis not present

## 2014-08-08 HISTORY — PX: BREAST LUMPECTOMY WITH RADIOACTIVE SEED AND SENTINEL LYMPH NODE BIOPSY: SHX6550

## 2014-08-08 LAB — POCT HEMOGLOBIN-HEMACUE: HEMOGLOBIN: 14.5 g/dL (ref 12.0–15.0)

## 2014-08-08 SURGERY — BREAST LUMPECTOMY WITH RADIOACTIVE SEED AND SENTINEL LYMPH NODE BIOPSY
Anesthesia: Regional | Laterality: Left

## 2014-08-08 MED ORDER — OXYCODONE HCL 5 MG PO TABS: 5.0000 mg | ORAL_TABLET | Freq: Once | ORAL | Status: DC | PRN

## 2014-08-08 MED ORDER — PROMETHAZINE HCL 25 MG/ML IJ SOLN
6.2500 mg | INTRAMUSCULAR | Status: DC | PRN
Start: 2014-08-08 — End: 2014-08-08

## 2014-08-08 MED ORDER — KETOROLAC TROMETHAMINE 30 MG/ML IJ SOLN: 30.0000 mg | Freq: Once | INTRAMUSCULAR | Status: DC | PRN

## 2014-08-08 MED ORDER — ACETAMINOPHEN 500 MG PO TABS
1000.0000 mg | ORAL_TABLET | Freq: Once | ORAL | Status: AC
  Administered 2014-08-08: 1000 mg via ORAL

## 2014-08-08 MED ORDER — FENTANYL CITRATE (PF) 100 MCG/2ML IJ SOLN
INTRAMUSCULAR | Status: AC
  Filled 2014-08-08: qty 2

## 2014-08-08 MED ORDER — CEFAZOLIN SODIUM-DEXTROSE 2-3 GM-% IV SOLR
INTRAVENOUS | Status: AC
  Filled 2014-08-08: qty 50

## 2014-08-08 MED ORDER — LACTATED RINGERS IV SOLN
INTRAVENOUS | Status: DC
Start: 2014-08-08 — End: 2014-08-08
  Administered 2014-08-08 (×2): via INTRAVENOUS

## 2014-08-08 MED ORDER — ACETAMINOPHEN 500 MG PO TABS
ORAL_TABLET | ORAL | Status: AC
Start: 2014-08-08 — End: 2014-08-08
  Filled 2014-08-08: qty 2

## 2014-08-08 MED ORDER — MIDAZOLAM HCL 5 MG/5ML IJ SOLN
INTRAMUSCULAR | Status: DC | PRN
  Administered 2014-08-08: 2 mg via INTRAVENOUS

## 2014-08-08 MED ORDER — MIDAZOLAM HCL 2 MG/2ML IJ SOLN
INTRAMUSCULAR | Status: AC
Start: 2014-08-08 — End: 2014-08-08
  Filled 2014-08-08: qty 2

## 2014-08-08 MED ORDER — SUCCINYLCHOLINE CHLORIDE 20 MG/ML IJ SOLN
INTRAMUSCULAR | Status: AC
  Filled 2014-08-08: qty 2

## 2014-08-08 MED ORDER — OXYCODONE HCL 5 MG/5ML PO SOLN: 5.0000 mg | Freq: Once | ORAL | Status: DC | PRN

## 2014-08-08 MED ORDER — FENTANYL CITRATE (PF) 100 MCG/2ML IJ SOLN
50.0000 ug | INTRAMUSCULAR | Status: DC | PRN
  Administered 2014-08-08: 100 ug via INTRAVENOUS

## 2014-08-08 MED ORDER — MIDAZOLAM HCL 2 MG/2ML IJ SOLN
1.0000 mg | INTRAMUSCULAR | Status: DC | PRN
  Administered 2014-08-08: 2 mg via INTRAVENOUS

## 2014-08-08 MED ORDER — DEXAMETHASONE SODIUM PHOSPHATE 4 MG/ML IJ SOLN
INTRAMUSCULAR | Status: DC | PRN
  Administered 2014-08-08: 10 mg via INTRAVENOUS

## 2014-08-08 MED ORDER — BUPIVACAINE-EPINEPHRINE 0.5% -1:200000 IJ SOLN
INTRAMUSCULAR | Status: DC | PRN
  Administered 2014-08-08: 27 mL

## 2014-08-08 MED ORDER — PROPOFOL 500 MG/50ML IV EMUL
INTRAVENOUS | Status: AC
  Filled 2014-08-08: qty 50

## 2014-08-08 MED ORDER — PHENYLEPHRINE HCL 10 MG/ML IJ SOLN
INTRAMUSCULAR | Status: DC | PRN
  Administered 2014-08-08: 40 ug via INTRAVENOUS

## 2014-08-08 MED ORDER — FENTANYL CITRATE (PF) 100 MCG/2ML IJ SOLN
INTRAMUSCULAR | Status: DC | PRN
  Administered 2014-08-08: 50 ug via INTRAVENOUS

## 2014-08-08 MED ORDER — ONDANSETRON HCL 4 MG/2ML IJ SOLN
INTRAMUSCULAR | Status: DC | PRN
Start: 2014-08-08 — End: 2014-08-08
  Administered 2014-08-08: 4 mg via INTRAVENOUS

## 2014-08-08 MED ORDER — FENTANYL CITRATE (PF) 100 MCG/2ML IJ SOLN
INTRAMUSCULAR | Status: AC
  Filled 2014-08-08: qty 6

## 2014-08-08 MED ORDER — HYDROCODONE-ACETAMINOPHEN 5-325 MG PO TABS: 1.0000 | ORAL_TABLET | ORAL | Status: DC | PRN

## 2014-08-08 MED ORDER — CEFAZOLIN SODIUM-DEXTROSE 2-3 GM-% IV SOLR
2.0000 g | INTRAVENOUS | Status: AC
  Administered 2014-08-08: 2 g via INTRAVENOUS

## 2014-08-08 MED ORDER — SODIUM CHLORIDE 0.9 % IJ SOLN
INTRAMUSCULAR | Status: DC | PRN
  Administered 2014-08-08: 5 mL

## 2014-08-08 MED ORDER — METHYLENE BLUE 1 % INJ SOLN
INTRAMUSCULAR | Status: AC
Start: 2014-08-08 — End: 2014-08-08
  Filled 2014-08-08: qty 10

## 2014-08-08 MED ORDER — SCOPOLAMINE 1 MG/3DAYS TD PT72
1.0000 | MEDICATED_PATCH | TRANSDERMAL | Status: DC
  Administered 2014-08-08: 1.5 mg via TRANSDERMAL

## 2014-08-08 MED ORDER — PROPOFOL 10 MG/ML IV BOLUS
INTRAVENOUS | Status: DC | PRN
  Administered 2014-08-08: 200 mg via INTRAVENOUS

## 2014-08-08 MED ORDER — PROPOFOL 10 MG/ML IV BOLUS
INTRAVENOUS | Status: AC
  Filled 2014-08-08: qty 80

## 2014-08-08 MED ORDER — HYDROMORPHONE HCL 1 MG/ML IJ SOLN
0.2500 mg | INTRAMUSCULAR | Status: DC | PRN
  Administered 2014-08-08: 0.5 mg via INTRAVENOUS

## 2014-08-08 MED ORDER — TECHNETIUM TC 99M SULFUR COLLOID FILTERED: 1.0000 | Freq: Once | INTRAVENOUS | Status: AC | PRN

## 2014-08-08 MED ORDER — LIDOCAINE HCL (CARDIAC) 20 MG/ML IV SOLN
INTRAVENOUS | Status: DC | PRN
  Administered 2014-08-08: 50 mg via INTRAVENOUS

## 2014-08-08 MED ORDER — HYDROMORPHONE HCL 1 MG/ML IJ SOLN
INTRAMUSCULAR | Status: AC
  Filled 2014-08-08: qty 1

## 2014-08-08 MED ORDER — MEPERIDINE HCL 25 MG/ML IJ SOLN: 6.2500 mg | INTRAMUSCULAR | Status: DC | PRN

## 2014-08-08 MED ORDER — SODIUM CHLORIDE 0.9 % IJ SOLN
INTRAMUSCULAR | Status: AC
  Filled 2014-08-08: qty 10

## 2014-08-08 MED ORDER — GLYCOPYRROLATE 0.2 MG/ML IJ SOLN: 0.2000 mg | Freq: Once | INTRAMUSCULAR | Status: DC | PRN

## 2014-08-08 MED ORDER — BUPIVACAINE-EPINEPHRINE (PF) 0.5% -1:200000 IJ SOLN
INTRAMUSCULAR | Status: AC
  Filled 2014-08-08: qty 30

## 2014-08-08 MED ORDER — MIDAZOLAM HCL 2 MG/2ML IJ SOLN
INTRAMUSCULAR | Status: AC
  Filled 2014-08-08: qty 2

## 2014-08-08 SURGICAL SUPPLY — 53 items
APPLIER CLIP 9.375 MED OPEN (MISCELLANEOUS) ×2
BINDER BREAST XLRG (GAUZE/BANDAGES/DRESSINGS) ×2 IMPLANT
BLADE HEX COATED 2.75 (ELECTRODE) ×2 IMPLANT
BLADE SURG 15 STRL LF DISP TIS (BLADE) ×1 IMPLANT
BLADE SURG 15 STRL SS (BLADE) ×1
CANISTER SUCT 1200ML W/VALVE (MISCELLANEOUS) IMPLANT
CHLORAPREP W/TINT 26ML (MISCELLANEOUS) ×2 IMPLANT
CLIP APPLIE 9.375 MED OPEN (MISCELLANEOUS) ×1 IMPLANT
CLIP TI WIDE RED SMALL 6 (CLIP) IMPLANT
COVER BACK TABLE 60X90IN (DRAPES) ×2 IMPLANT
COVER MAYO STAND STRL (DRAPES) ×2 IMPLANT
COVER PROBE W GEL 5X96 (DRAPES) ×2 IMPLANT
DECANTER SPIKE VIAL GLASS SM (MISCELLANEOUS) IMPLANT
DEVICE DUBIN W/COMP PLATE 8390 (MISCELLANEOUS) ×2 IMPLANT
DRAPE LAPAROSCOPIC ABDOMINAL (DRAPES) ×2 IMPLANT
DRAPE UTILITY XL STRL (DRAPES) ×2 IMPLANT
DRSG TEGADERM 4X4.75 (GAUZE/BANDAGES/DRESSINGS) IMPLANT
ELECT REM PT RETURN 9FT ADLT (ELECTROSURGICAL) ×2
ELECTRODE REM PT RTRN 9FT ADLT (ELECTROSURGICAL) ×1 IMPLANT
GLOVE BIOGEL PI IND STRL 7.0 (GLOVE) ×1 IMPLANT
GLOVE BIOGEL PI IND STRL 7.5 (GLOVE) ×1 IMPLANT
GLOVE BIOGEL PI IND STRL 8 (GLOVE) ×1 IMPLANT
GLOVE BIOGEL PI INDICATOR 7.0 (GLOVE) ×1
GLOVE BIOGEL PI INDICATOR 7.5 (GLOVE) ×1
GLOVE BIOGEL PI INDICATOR 8 (GLOVE) ×1
GLOVE SURG SIGNA 7.5 PF LTX (GLOVE) ×2 IMPLANT
GLOVE SURG SS PI 7.0 STRL IVOR (GLOVE) ×2 IMPLANT
GLOVE SURG SS PI 7.5 STRL IVOR (GLOVE) ×2 IMPLANT
GOWN STRL REUS W/ TWL LRG LVL3 (GOWN DISPOSABLE) ×1 IMPLANT
GOWN STRL REUS W/ TWL XL LVL3 (GOWN DISPOSABLE) ×2 IMPLANT
GOWN STRL REUS W/TWL LRG LVL3 (GOWN DISPOSABLE) ×1
GOWN STRL REUS W/TWL XL LVL3 (GOWN DISPOSABLE) ×2
KIT MARKER MARGIN INK (KITS) ×2 IMPLANT
LIQUID BAND (GAUZE/BANDAGES/DRESSINGS) ×2 IMPLANT
NDL SAFETY ECLIPSE 18X1.5 (NEEDLE) ×1 IMPLANT
NEEDLE HYPO 18GX1.5 SHARP (NEEDLE) ×1
NEEDLE HYPO 25X1 1.5 SAFETY (NEEDLE) ×4 IMPLANT
NS IRRIG 1000ML POUR BTL (IV SOLUTION) ×2 IMPLANT
PACK BASIN DAY SURGERY FS (CUSTOM PROCEDURE TRAY) ×2 IMPLANT
PENCIL BUTTON HOLSTER BLD 10FT (ELECTRODE) ×2 IMPLANT
SLEEVE SCD COMPRESS KNEE MED (MISCELLANEOUS) ×2 IMPLANT
SPONGE GAUZE 4X4 12PLY STER LF (GAUZE/BANDAGES/DRESSINGS) IMPLANT
SPONGE LAP 4X18 X RAY DECT (DISPOSABLE) ×2 IMPLANT
STRIP CLOSURE SKIN 1/2X4 (GAUZE/BANDAGES/DRESSINGS) IMPLANT
SUT MNCRL AB 4-0 PS2 18 (SUTURE) ×2 IMPLANT
SUT SILK 2 0 SH (SUTURE) IMPLANT
SUT VIC AB 3-0 SH 27 (SUTURE) ×1
SUT VIC AB 3-0 SH 27X BRD (SUTURE) ×1 IMPLANT
SYR CONTROL 10ML LL (SYRINGE) ×4 IMPLANT
TOWEL OR 17X24 6PK STRL BLUE (TOWEL DISPOSABLE) ×2 IMPLANT
TOWEL OR NON WOVEN STRL DISP B (DISPOSABLE) ×2 IMPLANT
TUBE CONNECTING 20X1/4 (TUBING) IMPLANT
YANKAUER SUCT BULB TIP NO VENT (SUCTIONS) IMPLANT

## 2014-08-08 NOTE — Transfer of Care (Signed)
Immediate Anesthesia Transfer of Care Note  Patient: [name]  Procedure(s) Performed: Procedure(s): BREAST LUMPECTOMY WITH RADIOACTIVE SEED AND SENTINEL LYMPH NODE BIOPSY (Left)  Patient Location: PACU  Anesthesia Type:GA combined with regional for post-op pain  Level of Consciousness: awake and patient cooperative  Airway & Oxygen Therapy: Patient Spontanous Breathing and Patient connected to face mask oxygen  Post-op Assessment: Report given to RN and Post -op Vital signs reviewed and stable  Post vital signs: Reviewed and stable  Last Vitals:  Filed Vitals:   08/08/14 0730  BP: 143/70  Pulse: 74  Temp:   Resp:     Complications: No apparent anesthesia complications

## 2014-08-08 NOTE — Anesthesia Procedure Notes (Addendum)
Anesthesia Regional Block:  Pectoralis block  Pre-Anesthetic Checklist: ,, timeout performed, Correct Patient, Correct Site, Correct Laterality, Correct Procedure, Correct Position, site marked, Risks and benefits discussed,  Surgical consent,  Pre-op evaluation,  At surgeon's request and post-op pain management  Laterality: Left and Upper  Prep: chloraprep       Needles:  Injection technique: Single-shot  Needle Type: Echogenic Needle     Needle Length: 9cm 9 cm Needle Gauge: 21 and 21 G    Additional Needles:  Procedures: ultrasound guided (picture in chart) Pectoralis block Narrative:  Start time: 08/08/2014 7:07 AM End time: 08/08/2014 7:12 AM Injection made incrementally with aspirations every 5 mL.  Performed by: Personally  Anesthesiologist: CREWS, DAVID   Procedure Name: LMA Insertion Date/Time: 08/08/2014 7:43 AM Performed by: Marrianne Mood Pre-anesthesia Checklist: Patient identified, Emergency Drugs available, Suction available, Patient being monitored and Timeout performed Patient Re-evaluated:Patient Re-evaluated prior to inductionOxygen Delivery Method: Circle System Utilized Preoxygenation: Pre-oxygenation with 100% oxygen Intubation Type: IV induction Ventilation: Mask ventilation without difficulty LMA: LMA inserted LMA Size: 4.0 Number of attempts: 1 Airway Equipment and Method: Bite block Placement Confirmation: positive ETCO2 Tube secured with: Tape Dental Injury: Teeth and Oropharynx as per pre-operative assessment

## 2014-08-08 NOTE — Op Note (Signed)
BREAST LUMPECTOMY WITH RADIOACTIVE SEED AND SENTINEL LYMPH NODE BIOPSY  Procedure Note  [name] 08/08/2014   Pre-op Diagnosis: Left Breast Cancer     Post-op Diagnosis: same  Procedure(s): RIGHT BREAST PARTIAL MASTECTOMY WITH RADIOACTIVE SEED AND SENTINEL LYMPH NODE BIOPSY INJECTION OF BLUE DYE  Surgeon(s): Coralie Keens, MD  Anesthesia: General  Staff:  Circulator: Maurene Capes, RN Scrub Person: Burt Ek, RN  Estimated Blood Loss: Minimal               Specimens: sent to path          [facility]   Date: 08/08/2014  Time: 8:39 AM

## 2014-08-08 NOTE — Addendum Note (Signed)
Addendum  created 08/08/14 1035 by Marrianne Mood, CRNA   Modules edited: Charges VN

## 2014-08-08 NOTE — Progress Notes (Signed)
nuc med staff performed injection. No additional sedation required. VSS stable. Family called to bedside and updated.

## 2014-08-08 NOTE — Interval H&P Note (Signed)
History and Physical Interval Note: no change in H and P  08/08/2014 7:03 AM  [name]  has presented today for surgery, with the diagnosis of Left Breast Cancer  The various methods of treatment have been discussed with the patient and family. After consideration of risks, benefits and other options for treatment, the patient has consented to  Procedure(s): BREAST LUMPECTOMY WITH RADIOACTIVE SEED AND SENTINEL LYMPH NODE BIOPSY (Left) as a surgical intervention .  The patient's history has been reviewed, patient examined, no change in status, stable for surgery.  I have reviewed the patient's chart and labs.  Questions were answered to the patient's satisfaction.     Dezmin Kittelson A

## 2014-08-08 NOTE — Anesthesia Postprocedure Evaluation (Signed)
  Anesthesia Post-op Note  Patient: [name]  Procedure(s) Performed: Procedure(s): BREAST LUMPECTOMY WITH RADIOACTIVE SEED AND SENTINEL LYMPH NODE BIOPSY (Left)  Patient Location: PACU  Anesthesia Type: General, Regional   Level of Consciousness: awake, alert  and oriented  Airway and Oxygen Therapy: Patient Spontanous Breathing  Post-op Pain: mild  Post-op Assessment: Post-op Vital signs reviewed  Post-op Vital Signs: Reviewed  Last Vitals:  Filed Vitals:   08/08/14 0927  BP:   Pulse: 80  Temp:   Resp: 10    Complications: No apparent anesthesia complications

## 2014-08-08 NOTE — Progress Notes (Signed)
Assisted Dr. Crews with left, ultrasound guided, pectoralis block. Side rails up, monitors on throughout procedure. See vital signs in flow sheet. Tolerated Procedure well. 

## 2014-08-08 NOTE — Discharge Instructions (Signed)
[facility] °Office Phone Number [phone] ° °BREAST BIOPSY/ PARTIAL MASTECTOMY: POST OP INSTRUCTIONS ° °Always review your discharge instruction sheet given to you by the facility where your surgery was performed. ° °IF YOU HAVE DISABILITY OR FAMILY LEAVE FORMS, YOU MUST BRING THEM TO THE OFFICE FOR PROCESSING.  DO NOT GIVE THEM TO YOUR DOCTOR. ° °1. A prescription for pain medication may be given to you upon discharge.  Take your pain medication as prescribed, if needed.  If narcotic pain medicine is not needed, then you may take acetaminophen (Tylenol) or ibuprofen (Advil) as needed. °2. Take your usually prescribed medications unless otherwise directed °3. If you need a refill on your pain medication, please contact your pharmacy.  They will contact our office to request authorization.  Prescriptions will not be filled after 5pm or on week-ends. °4. You should eat very light the first 24 hours after surgery, such as soup, crackers, pudding, etc.  Resume your normal diet the day after surgery. °5. Most patients will experience some swelling and bruising in the breast.  Ice packs and a good support bra will help.  Swelling and bruising can take several days to resolve.  °6. It is common to experience some constipation if taking pain medication after surgery.  Increasing fluid intake and taking a stool softener will usually help or prevent this problem from occurring.  A mild laxative (Milk of Magnesia or Miralax) should be taken according to package directions if there are no bowel movements after 48 hours. °7. Unless discharge instructions indicate otherwise, you may remove your bandages 24-48 hours after surgery, and you may shower at that time.  You may have steri-strips (small skin tapes) in place directly over the incision.  These strips should be left on the skin for 7-10 days.  If your surgeon used skin glue on the incision, you may shower in 24 hours.  The glue will flake off over the  next 2-3 weeks.  Any sutures or staples will be removed at the office during your follow-up visit. °8. ACTIVITIES:  You may resume regular daily activities (gradually increasing) beginning the next day.  Wearing a good support bra or sports bra minimizes pain and swelling.  You may have sexual intercourse when it is comfortable. °a. You may drive when you no longer are taking prescription pain medication, you can comfortably wear a seatbelt, and you can safely maneuver your car and apply brakes. °b. RETURN TO WORK:  ______________________________________________________________________________________ °9. You should see your doctor in the office for a follow-up appointment approximately two weeks after your surgery.  Your doctor’s nurse will typically make your follow-up appointment when she calls you with your pathology report.  Expect your pathology report 2-3 business days after your surgery.  You may call to check if you do not hear from us after three days. °10. OTHER INSTRUCTIONS: _______________________________________________________________________________________________ _____________________________________________________________________________________________________________________________________ °_____________________________________________________________________________________________________________________________________ °_____________________________________________________________________________________________________________________________________ ° °WHEN TO CALL YOUR DOCTOR: °1. Fever over 101.0 °2. Nausea and/or vomiting. °3. Extreme swelling or bruising. °4. Continued bleeding from incision. °5. Increased pain, redness, or drainage from the incision. ° °The clinic staff is available to answer your questions during regular business hours.  Please don’t hesitate to call and ask to speak to one of the nurses for clinical concerns.  If you have a medical emergency, go to the nearest  emergency room or call 911.  A surgeon from [facility] is always on call at the hospital. ° °For further questions, please visit [email or web address]  ° ° ° °  Post Anesthesia Home Care Instructions ° °Activity: °Get plenty of rest for the remainder of the day. A responsible adult should stay with you for 24 hours following the procedure.  °For the next 24 hours, DO NOT: °-Drive a car °-Operate machinery °-Drink alcoholic beverages °-Take any medication unless instructed by your physician °-Make any legal decisions or sign important papers. ° °Meals: °Start with liquid foods such as gelatin or soup. Progress to regular foods as tolerated. Avoid greasy, spicy, heavy foods. If nausea and/or vomiting occur, drink only clear liquids until the nausea and/or vomiting subsides. Call your physician if vomiting continues. ° °Special Instructions/Symptoms: °Your throat may feel dry or sore from the anesthesia or the breathing tube placed in your throat during surgery. If this causes discomfort, gargle with warm salt water. The discomfort should disappear within 24 hours. ° °If you had a scopolamine patch placed behind your ear for the management of post- operative nausea and/or vomiting: ° °1. The medication in the patch is effective for 72 hours, after which it should be removed.  Wrap patch in a tissue and discard in the trash. Wash hands thoroughly with soap and water. °2. You may remove the patch earlier than 72 hours if you experience unpleasant side effects which may include dry mouth, dizziness or visual disturbances. °3. Avoid touching the patch. Wash your hands with soap and water after contact with the patch. °  ° °

## 2014-08-09 ENCOUNTER — Encounter (HOSPITAL_BASED_OUTPATIENT_CLINIC_OR_DEPARTMENT_OTHER): Payer: Self-pay | Admitting: Surgery

## 2014-08-09 NOTE — Op Note (Signed)
[name], [name] NO.:  [id number]  MEDICAL RECORD NO.:  [id number]  LOCATION:  [facility]                          FACILITY:  [facility]  PHYSICIAN:  Coralie Keens, M.D. DATE OF BIRTH:  03-Jul-1960  DATE OF PROCEDURE:  08/08/2014 DATE OF DISCHARGE:                              OPERATIVE REPORT   PREOPERATIVE DIAGNOSIS:  Left breast cancer.  POSTOPERATIVE DIAGNOSIS:  Left breast cancer.  PROCEDURES: 1. Left breast radioactive seed partial mastectomy. 2. Left axillary sentinel lymph node biopsy. 3. Injection of blue dye.  SURGEON:  Coralie Keens, M.D.  ANESTHESIA:  General with Marcaine and nerve block.  ESTIMATED BLOOD LOSS:  Minimal.  INDICATIONS:  A 54 year old female was found to have a left breast mass on recent mammography.  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 Decision was made to proceed after MRI with a radioactive seed left breast partial mastectomy and sentinel lymph node biopsy.  FINDINGS:  The radioactive seed and previous marker were found to be in the biopsy specimen.  Three sentinel lymph nodes were removed.  PROCEDURE IN DETAIL:  The patient was identified in the holding area and the nerve block was applied by Anesthesia.  The Neoprobe was brought into the room and the radioactive seed was confirmed to be in the left breast.  Radioactive isotope was then injected by the radiation technologist around the areola of the left breast.  The patient was then taken to the operating room and placed supine on the operating room table and general anesthesia was induced.  Her left breast and axilla were then prepped and draped in usual sterile fashion after I injected blue dye underneath the areola and massaged the breast.  The Neoprobe was again brought into the field.  I identified the area of increased uptake in the upper outer quadrant of the left breast.  I then made  an elliptical incision in the skin with the scalpel after anesthetizing with Marcaine.  I then did a wide partial mastectomy of the upper outer quadrant of the breast going all the way down to the chest wall, staying greater than 5 cm circumferentially around the radioactive seed with the aid of Neoprobe.  I again took this all the way down to the chest wall. Once the specimen was removed, I marked with marker paint and again the seed was confirmed to be in the specimen with the Neoprobe.  X-ray was then performed on the specimen and the seed and previous marker was confirmed to be in the specimen.  It was then sent to Pathology for evaluation.  I then identified an area of increased uptake in the left axilla.  I made incision with a scalpel and took this down to the axillary tissue with the cautery.  With the aid of the Neoprobe and blue dye, I was able to identify 3 sentinel lymph nodes that all had uptake of blue dye and radioactivity.  These 3 lymph nodes were then excised separately and sent to Pathology for evaluation.  I then again evaluated the nodal basin, found no other increased uptake.  I then anesthetized both wounds with Marcaine.  I placed surgical clips around the margins of the biopsy cavity.  I then closed both incisions with interrupted 3-0 Vicryl sutures and running 4-0 Monocryl sutures.  Skin glue and a binder were then applied.  The patient tolerated the procedure well.  All the counts were correct at the end of procedure.  The patient was then extubated in the operating room and taken in stable condition to the recovery room.     Coralie Keens, M.D.     DB/MEDQ  D:  08/08/2014  T:  08/09/2014  Job:  [id number]

## 2014-08-11 ENCOUNTER — Other Ambulatory Visit: Payer: Self-pay | Admitting: Surgery

## 2014-08-11 ENCOUNTER — Other Ambulatory Visit: Payer: Self-pay | Admitting: *Deleted

## 2014-08-11 ENCOUNTER — Telehealth: Payer: Self-pay | Admitting: Hematology and Oncology

## 2014-08-11 DIAGNOSIS — C50412 Malignant neoplasm of upper-outer quadrant of left female breast: Secondary | ICD-10-CM

## 2014-08-11 NOTE — Telephone Encounter (Signed)
Pt confirmed labs/ov per 06/03 POF also schedule ECHO, NPR per Benedetto Goad, scheduled chemo education .... Cherylann Banas

## 2014-08-14 ENCOUNTER — Encounter (HOSPITAL_COMMUNITY): Payer: Self-pay

## 2014-08-14 ENCOUNTER — Ambulatory Visit (HOSPITAL_COMMUNITY)
Admission: RE | Admit: 2014-08-14 | Discharge: 2014-08-14 | Disposition: A | Discharge status: Home / Self Care | Payer: PRIVATE HEALTH INSURANCE | Source: Ambulatory Visit | Attending: Hematology and Oncology | Admitting: Hematology and Oncology

## 2014-08-14 ENCOUNTER — Encounter (HOSPITAL_COMMUNITY)
Admission: RE | Admit: 2014-08-14 | Discharge: 2014-08-14 | Disposition: A | Discharge status: Home / Self Care | Payer: PRIVATE HEALTH INSURANCE | Source: Ambulatory Visit | Attending: Hematology and Oncology | Admitting: Hematology and Oncology

## 2014-08-14 ENCOUNTER — Encounter (HOSPITAL_BASED_OUTPATIENT_CLINIC_OR_DEPARTMENT_OTHER): Payer: Self-pay | Admitting: *Deleted

## 2014-08-14 DIAGNOSIS — C50412 Malignant neoplasm of upper-outer quadrant of left female breast: Secondary | ICD-10-CM | POA: Insufficient documentation

## 2014-08-14 DIAGNOSIS — C50912 Malignant neoplasm of unspecified site of left female breast: Secondary | ICD-10-CM | POA: Diagnosis present

## 2014-08-14 MED ORDER — IOHEXOL 300 MG/ML  SOLN
100.0000 mL | Freq: Once | INTRAMUSCULAR | Status: AC | PRN
  Administered 2014-08-14: 100 mL via INTRAVENOUS

## 2014-08-14 MED ORDER — TECHNETIUM TC 99M MEDRONATE IV KIT: 27.5000 | PACK | Freq: Once | INTRAVENOUS | Status: AC | PRN

## 2014-08-15 ENCOUNTER — Ambulatory Visit (HOSPITAL_BASED_OUTPATIENT_CLINIC_OR_DEPARTMENT_OTHER): Payer: PRIVATE HEALTH INSURANCE | Admitting: Hematology and Oncology

## 2014-08-15 ENCOUNTER — Other Ambulatory Visit: Payer: PRIVATE HEALTH INSURANCE

## 2014-08-15 ENCOUNTER — Encounter: Payer: Self-pay | Admitting: *Deleted

## 2014-08-15 ENCOUNTER — Ambulatory Visit (HOSPITAL_COMMUNITY)
Admission: RE | Admit: 2014-08-15 | Discharge: 2014-08-15 | Disposition: A | Discharge status: Home / Self Care | Payer: PRIVATE HEALTH INSURANCE | Source: Ambulatory Visit | Attending: Hematology and Oncology | Admitting: Hematology and Oncology

## 2014-08-15 VITALS — BP 99/80 | HR 80 | Temp 99.4°F | Resp 18 | Ht 65.0 in | Wt 144.4 lb

## 2014-08-15 DIAGNOSIS — C50412 Malignant neoplasm of upper-outer quadrant of left female breast: Secondary | ICD-10-CM

## 2014-08-15 DIAGNOSIS — C773 Secondary and unspecified malignant neoplasm of axilla and upper limb lymph nodes: Secondary | ICD-10-CM

## 2014-08-15 DIAGNOSIS — E785 Hyperlipidemia, unspecified: Secondary | ICD-10-CM | POA: Diagnosis not present

## 2014-08-15 DIAGNOSIS — Z01818 Encounter for other preprocedural examination: Secondary | ICD-10-CM | POA: Diagnosis present

## 2014-08-15 NOTE — Progress Notes (Signed)
Met with pt during post op appt with Dr. Lindi Adie. Relate she is a little overwhelmed with the news of + LN and need for ALND. Referred pt to [facility] Payor to discuss lymphedema prevention and an Alight guide. Pt has contact information to call with questions or concerns.

## 2014-08-15 NOTE — Progress Notes (Signed)
Patient Care Team: Chipper Herb, MD as PCP - General (Family Medicine)  DIAGNOSIS: No matching staging information was found for the patient.  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, with LCIS, medial and inferior margins positive, 3/4 lymph nodes positive T2 N1 cM0 stage IIB    CHIEF COMPLIANT: Follow-up after surgery to discuss treatment plan  INTERVAL HISTORY: MAICEE ULLMAN is a 54 year old with above-mentioned history of left breast cancer treated with lumpectomy. She had 3 positive lymph nodes. She is scheduled to undergo reresection for positive margins tomorrow. She'll be presented at tumor board tomorrow. There is a plan to do axillary lymph node dissection along with the resection margins tomorrow.   REVIEW OF SYSTEMS:   Constitutional: Denies fevers, chills or abnormal weight loss Eyes: Denies blurriness of vision Ears, nose, mouth, throat, and face: Denies mucositis or sore throat Respiratory: Denies cough, dyspnea or wheezes Cardiovascular: Denies palpitation, chest discomfort or lower extremity swelling Gastrointestinal:  Denies nausea, heartburn or change in bowel habits Skin: Denies abnormal skin rashes Lymphatics: Denies new lymphadenopathy or easy bruising Neurological:Denies numbness, tingling or new weaknesses Behavioral/Psych: Mood is stable, no new changes  Breast:  denies any pain or lumps or nodules in either breasts All other systems  were reviewed with the patient and are negative.  I have reviewed the past medical history, past surgical history, social history and family history with the patient and they are unchanged from previous note.  ALLERGIES:  is allergic to crestor; lipitor; and temazepam.  MEDICATIONS:  Current Outpatient Prescriptions  Medication Sig Dispense Refill  . acetaminophen (TYLENOL) 500 MG tablet Take 500 mg by mouth every 6 (six) hours as needed for pain.    . clonazePAM (KLONOPIN) 0.5 MG tablet TAKE 1 TABLET THREE TIMES A DAY 90 tablet 1  . HYDROcodone-acetaminophen (NORCO) 5-325 MG per tablet Take 1-2 tablets by mouth every 4 (four) hours as needed. 30 tablet 0  . PARoxetine (PAXIL-CR) 25 MG 24 hr tablet TAKE 1 TABLET BY MOUTH EVERY DAY 30 tablet 6   No current facility-administered medications for this visit.    PHYSICAL EXAMINATION: ECOG PERFORMANCE STATUS: 1 - Symptomatic but completely ambulatory  Filed Vitals:   08/15/14 1336  BP: 99/80  Pulse: 80  Temp: 99.4 F (37.4 C)  Resp: 18   Filed Weights   08/15/14 1336  Weight: 144 lb 6.4 oz (65.499 kg)    GENERAL:alert, no distress and comfortable SKIN: skin color, texture, turgor are normal, no rashes or significant lesions EYES: normal, Conjunctiva are pink and non-injected, sclera clear OROPHARYNX:no exudate, no erythema and lips, buccal mucosa, and tongue normal  NECK: supple, thyroid normal size, non-tender, without nodularity LYMPH:  no palpable lymphadenopathy in the cervical, axillary or inguinal LUNGS: clear to auscultation and percussion with normal breathing effort HEART: regular rate & rhythm and no murmurs and no lower extremity edema ABDOMEN:abdomen soft, non-tender and normal bowel sounds Musculoskeletal:no cyanosis of  digits and no clubbing  NEURO: alert & oriented x 3 with fluent speech, no focal motor/sensory deficits   LABORATORY DATA:  I have reviewed the data as listed   Chemistry      Component Value  Date/Time   NA 141 07/21/2013 1023   NA 139 10/04/2012 1123   K 4.0 07/21/2013 1023   CL 100 07/21/2013 1023   CO2 26 07/21/2013 1023   BUN 14 07/21/2013 1023   BUN 10 10/04/2012 1123   CREATININE 0.45* 07/21/2013 1023   CREATININE 0.54 10/04/2012 1123      Component Value Date/Time   CALCIUM 8.8 07/21/2013 1023   ALKPHOS 53 07/21/2013 1023   AST 15 07/21/2013 1023   ALT 14 07/21/2013 1023   BILITOT 0.5 07/21/2013 1023       Lab Results  Component Value Date   WBC 5.5 07/21/2013   HGB 14.5 08/08/2014   HCT 40.4 07/21/2013   MCV 92.9 07/21/2013   PLT 280 07/26/2012     RADIOGRAPHIC STUDIES: I have personally reviewed the radiology reports and agreed with their findings. Ct Chest W Contrast  08/14/2014   CLINICAL DATA:  Malignant neoplasm of upper outer quadrant of left breast. Staging. Surgery only. Partial mastectomy and lymph node dissection.  EXAM: CT CHEST, ABDOMEN, AND PELVIS WITH CONTRAST  TECHNIQUE: Multidetector CT imaging of the chest, abdomen and pelvis was performed following the standard protocol during bolus administration of intravenous contrast.  CONTRAST:  146m OMNIPAQUE IOHEXOL 300 MG/ML  SOLN  COMPARISON:  Breast MRI of 08/01/2014. Today's bone scan. Abdominal pelvic CT of 09/28/2012. No prior chest CT. Chest radiograph of 03/15/2008 is reviewed.  FINDINGS: CT CHEST FINDINGS  Mediastinum/Nodes: Left lumpectomy with postoperative fluid and gas within the lateral left breast. This measures 7.8 x 4.7 cm. Edema in the left axilla without axillary adenopathy.  Thoracic aortic atherosclerosis. Heart size upper normal, without pericardial effusion. No central pulmonary embolism, on this non-dedicated study. No mediastinal or hilar adenopathy. No internal mammary adenopathy.  Lungs/Pleura: No pleural fluid.  No nodules or airspace opacities.  Musculoskeletal: No acute osseous abnormality.  CT ABDOMEN PELVIS FINDINGS  Hepatobiliary: Sub mm focus of early post-contrast hyper  enhancement within the anterior segment right liver lobe (segment 8). Image 46. Not well visualized on kidney delayed images. Probable gallstones or sludge without acute cholecystitis or biliary ductal dilatation.  Pancreas: Normal, without mass or ductal dilatation.  Spleen: Normal  Adrenals/Urinary Tract: Normal adrenal glands. Normal kidneys, without hydronephrosis. Normal urinary bladder.  Stomach/Bowel: Normal stomach, without wall thickening. Normal colon, appendix, and terminal ileum. Normal small bowel.  Vascular/Lymphatic: Aortic atherosclerosis. No abdominopelvic adenopathy.  Reproductive: Suspect a 1.2 cm uterine fundal fibroid eccentric right. A smaller low-density left fundal lesion is also likely a fibroid. No adnexal mass. Possible right hydrosalpinx on image 104. Probable nabothian cysts.  Other: No significant free fluid. No evidence of omental or peritoneal disease.  Musculoskeletal: Prominent disc bulge at the lumbosacral junction.  IMPRESSION: 1. Left-sided lumpectomy and axillary node dissection. No evidence of metastatic disease. 2. Left breast fluid and gas collection is likely a postoperative seroma. 3. 7 mm focus of hyper enhancement in the high right liver lobe. Favored to represent a perfusion anomaly or flash fill hemangioma. This could either be re-evaluated on follow-up CT or if further characterization is desired, evaluated with dedicated pre and post contrast abdominal MRI. 4. Cholelithiasis. 5. Probable small uterine fibroids. Cannot exclude right hydrosalpinx. Ultrasound may be informative.  Electronically Signed   By: Abigail Miyamoto M.D.   On: 08/14/2014 13:49   Nm Bone Scan Whole Body  08/14/2014   CLINICAL DATA:  Breast malignancy. Currently no skeletal complaints.  EXAM: NUCLEAR MEDICINE WHOLE BODY BONE SCAN  TECHNIQUE: Whole body anterior and posterior images were obtained approximately 3 hours after intravenous injection of radiopharmaceutical.  RADIOPHARMACEUTICALS:  27.5  mCi Technetium-77mMDP IV  COMPARISON:  No previous nuclear bone scans for comparison  FINDINGS: There is adequate uptake of the radiopharmaceutical by the skeleton. There is adequate soft tissue clearance and renal activity. Uptake within the skeleton is normal for age. There are no findings suspicious for metastatic disease.  IMPRESSION: There is no evidence of osseous metastatic disease to the bone.   Electronically Signed   By: David  JMartiniqueM.D.   On: 08/14/2014 12:44   Ct Abdomen Pelvis W Contrast  08/14/2014   CLINICAL DATA:  Malignant neoplasm of upper outer quadrant of left breast. Staging. Surgery only. Partial mastectomy and lymph node dissection.  EXAM: CT CHEST, ABDOMEN, AND PELVIS WITH CONTRAST  TECHNIQUE: Multidetector CT imaging of the chest, abdomen and pelvis was performed following the standard protocol during bolus administration of intravenous contrast.  CONTRAST:  1073mOMNIPAQUE IOHEXOL 300 MG/ML  SOLN  COMPARISON:  Breast MRI of 08/01/2014. Today's bone scan. Abdominal pelvic CT of 09/28/2012. No prior chest CT. Chest radiograph of 03/15/2008 is reviewed.  FINDINGS: CT CHEST FINDINGS  Mediastinum/Nodes: Left lumpectomy with postoperative fluid and gas within the lateral left breast. This measures 7.8 x 4.7 cm. Edema in the left axilla without axillary adenopathy.  Thoracic aortic atherosclerosis. Heart size upper normal, without pericardial effusion. No central pulmonary embolism, on this non-dedicated study. No mediastinal or hilar adenopathy. No internal mammary adenopathy.  Lungs/Pleura: No pleural fluid.  No nodules or airspace opacities.  Musculoskeletal: No acute osseous abnormality.  CT ABDOMEN PELVIS FINDINGS  Hepatobiliary: Sub mm focus of early post-contrast hyper enhancement within the anterior segment right liver lobe (segment 8). Image 46. Not well visualized on kidney delayed images. Probable gallstones or sludge without acute cholecystitis or biliary ductal dilatation.   Pancreas: Normal, without mass or ductal dilatation.  Spleen: Normal  Adrenals/Urinary Tract: Normal adrenal glands. Normal kidneys, without hydronephrosis. Normal urinary bladder.  Stomach/Bowel: Normal stomach, without wall thickening. Normal colon, appendix, and terminal ileum. Normal small bowel.  Vascular/Lymphatic: Aortic atherosclerosis. No abdominopelvic adenopathy.  Reproductive: Suspect a 1.2 cm uterine fundal fibroid eccentric right. A smaller low-density left fundal lesion is also likely a fibroid. No adnexal mass. Possible right hydrosalpinx on image 104. Probable nabothian cysts.  Other: No significant free fluid. No evidence of omental or peritoneal disease.  Musculoskeletal: Prominent disc bulge at the lumbosacral junction.  IMPRESSION: 1. Left-sided lumpectomy and axillary node dissection. No evidence of metastatic disease. 2. Left breast fluid and gas collection is likely a postoperative seroma. 3. 7 mm focus of hyper enhancement in the high right liver lobe. Favored to represent a perfusion anomaly or flash fill hemangioma. This could either be re-evaluated on follow-up CT or if further characterization is desired, evaluated with dedicated pre and post contrast abdominal MRI. 4. Cholelithiasis. 5. Probable small uterine fibroids. Cannot exclude right hydrosalpinx. Ultrasound may be informative.   Electronically Signed   By: KyAbigail Miyamoto.D.   On: 08/14/2014 13:49     ASSESSMENT & PLAN:  Breast cancer of upper-outer quadrant of left female breast Left breast lumpectomy 08/08/2014: Invasive grade 1 lobular carcinoma 2.6  cm, with LCIS, medial and inferior margins positive, 3/4 lymph nodes positive T2 N1 cM0 stage IIB, ER 86%, PR 78%, HER-2/neu negative ratio 1.77, Ki-67 14%  Pathology review: I discussed pathology reporting great detail and provided with a copy of this report. Recommendation: 1. Repeat surgery or the positive margins 2. Await radiation oncology consultation regarding  axillary adjuvant radiation versus axillary lymph node dissection. I discussed with the patient that with adjuvant chemotherapy and radiation, the role of axillary lymph node dissection has diminished significantly. However she represented the tumor board tomorrow and a final decision will be taken. 3. Adjuvant chemotherapy with dose dense Adriamycin and Cytoxan x 4 followed by Abraxane weekly 12 because of lymph node positive disease. 4. Followed by adjuvant radiation 5. Followed by antiestrogen therapy  Plan: 1. Port placement during the repeat surgery 2. Echocardiogram 3. Chemotherapy class 4. Plan to start chemotherapy 2 to 3 weeks after the repeat surgery  I discussed with the patient extensively about the risks of lymphedema with axillary lymph node dissection as well as with axillary radiation. I made sure that she has contact for [facility] our physical therapist.    No orders of the defined types were placed in this encounter.   The patient has a good understanding of the overall plan. she agrees with it. she will call with any problems that may develop before the next visit here.   Rulon Eisenmenger, MD     Regarding this is from medical oncology wanted to touch base with you on his wound

## 2014-08-15 NOTE — Assessment & Plan Note (Signed)
Left breast lumpectomy 08/08/2014: 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, with LCIS, medial and inferior margins positive, 3/4 lymph nodes positive T2 N1 cM0 stage IIB, ER 86%, PR 78%, HER-2/neu negative ratio 1.77, Ki-67 14%  Pathology review: I discussed pathology reporting great detail and provided with a copy of this report. Recommendation: 1. Repeat surgery or the positive margins 2. Await radiation oncology consultation regarding axillary adjuvant radiation versus axillary lymph node dissection. I discussed with the patient that with adjuvant chemotherapy and radiation, the role of axillary lymph node dissection has diminished significantly. 3. Adjuvant chemotherapy with dose dense Adriamycin and Cytoxan x 4 followed by Abraxane weekly 12 because of lymph node positive disease. 4. Followed by adjuvant radiation 5. Followed by antiestrogen therapy  Plan: 1. Port placement during the repeat surgery 2. Echocardiogram 3. Chemotherapy class 4. Plan to start chemotherapy 2 to 3 weeks after the repeat surgery

## 2014-08-15 NOTE — Progress Notes (Signed)
Echocardiogram 2D Echocardiogram has been performed.  [name] 08/15/2014, 10:08 AM

## 2014-08-15 NOTE — H&P (Signed)
[name] is an 54 y.o. female.   Chief Complaint: left breast cancer HPI: she is s/p recent left breast lumpectomy and sentinel node biopsy for invasive lobular carcinoma.  The final path showed positive margins and 3 nodes positive for metastatic disease.  She now presents for port-a-cath insertion, re-excision of the left breast to try and achieve negative margins and left completion axillary node dissection.  She is currently without complaints.  Past Medical History  Diagnosis Date  . Cervical stenosis (uterine cervix)   . Abnormal Pap smear of cervix     Dr. Eilleen Kempf   . Hyperlipidemia   . Dizziness   . Insomnia   . Nervousness(799.21)   . Meniere's disease   . Benign hemangioma     Dr,  Marybelle Killings   . Allergy   . Breast cancer 07/20/14    Left Breast  . Anxiety   . Depression     Past Surgical History  Procedure Laterality Date  . Uterine ablation  02/2006     Due to heavy periods Dr. Jon Billings   . Bilateral  tubal ligation  05/08/2004  . Tubal ligation    . Wisdom teeth extractions  yrs ago  . Shoulder open rotator cuff repair Right 07/28/2012    Procedure: RIGHT SHOULDER DECOMPRESSION AND EXCISION OF CALCIFIC DEPOSIT;  Surgeon: Tobi Bastos, MD;  Location: [facility];  Service: Orthopedics;  Laterality: Right;  . Breast lumpectomy with radioactive seed and sentinel lymph node biopsy Left 08/08/2014    Procedure: BREAST LUMPECTOMY WITH RADIOACTIVE SEED AND SENTINEL LYMPH NODE BIOPSY;  Surgeon: Coralie Keens, MD;  Location: [place];  Service: General;  Laterality: Left;    Family History  Problem Relation Age of Onset  . Cirrhosis Father   . Colon polyps Mother   . Colon cancer Neg Hx    Social History:  reports that she has never smoked. She has never used smokeless tobacco. She reports that she drinks alcohol. She reports that she does not use illicit drugs.  Allergies:  Allergies  Allergen Reactions  . Crestor [Rosuvastatin] Other (See  Comments)    myalgias  . Lipitor [Atorvastatin] Other (See Comments)    myalgias  . Temazepam Other (See Comments)    Pt does not remember    No prescriptions prior to admission    No results found for this or any previous visit (from the past 48 hour(s)). Ct Chest W Contrast  08/14/2014   CLINICAL DATA:  Malignant neoplasm of upper outer quadrant of left breast. Staging. Surgery only. Partial mastectomy and lymph node dissection.  EXAM: CT CHEST, ABDOMEN, AND PELVIS WITH CONTRAST  TECHNIQUE: Multidetector CT imaging of the chest, abdomen and pelvis was performed following the standard protocol during bolus administration of intravenous contrast.  CONTRAST:  151m OMNIPAQUE IOHEXOL 300 MG/ML  SOLN  COMPARISON:  Breast MRI of 08/01/2014. Today's bone scan. Abdominal pelvic CT of 09/28/2012. No prior chest CT. Chest radiograph of 03/15/2008 is reviewed.  FINDINGS: CT CHEST FINDINGS  Mediastinum/Nodes: Left lumpectomy with postoperative fluid and gas within the lateral left breast. This measures 7.8 x 4.7 cm. Edema in the left axilla without axillary adenopathy.  Thoracic aortic atherosclerosis. Heart size upper normal, without pericardial effusion. No central pulmonary embolism, on this non-dedicated study. No mediastinal or hilar adenopathy. No internal mammary adenopathy.  Lungs/Pleura: No pleural fluid.  No nodules or airspace opacities.  Musculoskeletal: No acute osseous abnormality.  CT ABDOMEN PELVIS FINDINGS  Hepatobiliary: Sub  mm focus of early post-contrast hyper enhancement within the anterior segment right liver lobe (segment 8). Image 46. Not well visualized on kidney delayed images. Probable gallstones or sludge without acute cholecystitis or biliary ductal dilatation.  Pancreas: Normal, without mass or ductal dilatation.  Spleen: Normal  Adrenals/Urinary Tract: Normal adrenal glands. Normal kidneys, without hydronephrosis. Normal urinary bladder.  Stomach/Bowel: Normal stomach, without wall  thickening. Normal colon, appendix, and terminal ileum. Normal small bowel.  Vascular/Lymphatic: Aortic atherosclerosis. No abdominopelvic adenopathy.  Reproductive: Suspect a 1.2 cm uterine fundal fibroid eccentric right. A smaller low-density left fundal lesion is also likely a fibroid. No adnexal mass. Possible right hydrosalpinx on image 104. Probable nabothian cysts.  Other: No significant free fluid. No evidence of omental or peritoneal disease.  Musculoskeletal: Prominent disc bulge at the lumbosacral junction.  IMPRESSION: 1. Left-sided lumpectomy and axillary node dissection. No evidence of metastatic disease. 2. Left breast fluid and gas collection is likely a postoperative seroma. 3. 7 mm focus of hyper enhancement in the high right liver lobe. Favored to represent a perfusion anomaly or flash fill hemangioma. This could either be re-evaluated on follow-up CT or if further characterization is desired, evaluated with dedicated pre and post contrast abdominal MRI. 4. Cholelithiasis. 5. Probable small uterine fibroids. Cannot exclude right hydrosalpinx. Ultrasound may be informative.   Electronically Signed   By: Abigail Miyamoto M.D.   On: 08/14/2014 13:49   Nm Bone Scan Whole Body  08/14/2014   CLINICAL DATA:  Breast malignancy. Currently no skeletal complaints.  EXAM: NUCLEAR MEDICINE WHOLE BODY BONE SCAN  TECHNIQUE: Whole body anterior and posterior images were obtained approximately 3 hours after intravenous injection of radiopharmaceutical.  RADIOPHARMACEUTICALS:  27.5 mCi Technetium-64mMDP IV  COMPARISON:  No previous nuclear bone scans for comparison  FINDINGS: There is adequate uptake of the radiopharmaceutical by the skeleton. There is adequate soft tissue clearance and renal activity. Uptake within the skeleton is normal for age. There are no findings suspicious for metastatic disease.  IMPRESSION: There is no evidence of osseous metastatic disease to the bone.   Electronically Signed   By: David   JMartiniqueM.D.   On: 08/14/2014 12:44   Ct Abdomen Pelvis W Contrast  08/14/2014   CLINICAL DATA:  Malignant neoplasm of upper outer quadrant of left breast. Staging. Surgery only. Partial mastectomy and lymph node dissection.  EXAM: CT CHEST, ABDOMEN, AND PELVIS WITH CONTRAST  TECHNIQUE: Multidetector CT imaging of the chest, abdomen and pelvis was performed following the standard protocol during bolus administration of intravenous contrast.  CONTRAST:  1020mOMNIPAQUE IOHEXOL 300 MG/ML  SOLN  COMPARISON:  Breast MRI of 08/01/2014. Today's bone scan. Abdominal pelvic CT of 09/28/2012. No prior chest CT. Chest radiograph of 03/15/2008 is reviewed.  FINDINGS: CT CHEST FINDINGS  Mediastinum/Nodes: Left lumpectomy with postoperative fluid and gas within the lateral left breast. This measures 7.8 x 4.7 cm. Edema in the left axilla without axillary adenopathy.  Thoracic aortic atherosclerosis. Heart size upper normal, without pericardial effusion. No central pulmonary embolism, on this non-dedicated study. No mediastinal or hilar adenopathy. No internal mammary adenopathy.  Lungs/Pleura: No pleural fluid.  No nodules or airspace opacities.  Musculoskeletal: No acute osseous abnormality.  CT ABDOMEN PELVIS FINDINGS  Hepatobiliary: Sub mm focus of early post-contrast hyper enhancement within the anterior segment right liver lobe (segment 8). Image 46. Not well visualized on kidney delayed images. Probable gallstones or sludge without acute cholecystitis or biliary ductal dilatation.  Pancreas: Normal, without mass  or ductal dilatation.  Spleen: Normal  Adrenals/Urinary Tract: Normal adrenal glands. Normal kidneys, without hydronephrosis. Normal urinary bladder.  Stomach/Bowel: Normal stomach, without wall thickening. Normal colon, appendix, and terminal ileum. Normal small bowel.  Vascular/Lymphatic: Aortic atherosclerosis. No abdominopelvic adenopathy.  Reproductive: Suspect a 1.2 cm uterine fundal fibroid eccentric right.  A smaller low-density left fundal lesion is also likely a fibroid. No adnexal mass. Possible right hydrosalpinx on image 104. Probable nabothian cysts.  Other: No significant free fluid. No evidence of omental or peritoneal disease.  Musculoskeletal: Prominent disc bulge at the lumbosacral junction.  IMPRESSION: 1. Left-sided lumpectomy and axillary node dissection. No evidence of metastatic disease. 2. Left breast fluid and gas collection is likely a postoperative seroma. 3. 7 mm focus of hyper enhancement in the high right liver lobe. Favored to represent a perfusion anomaly or flash fill hemangioma. This could either be re-evaluated on follow-up CT or if further characterization is desired, evaluated with dedicated pre and post contrast abdominal MRI. 4. Cholelithiasis. 5. Probable small uterine fibroids. Cannot exclude right hydrosalpinx. Ultrasound may be informative.   Electronically Signed   By: Abigail Miyamoto M.D.   On: 08/14/2014 13:49    Review of Systems  All other systems reviewed and are negative.   Height '5\' 5"'$  (1.651 m), weight 65.318 kg (144 lb). Physical Exam  Constitutional: She appears well-nourished. No distress.  HENT:  Head: Normocephalic.  Eyes: Conjunctivae are normal. Pupils are equal, round, and reactive to light. No scleral icterus.  Neck: Normal range of motion. No tracheal deviation present.  Cardiovascular: Normal rate, regular rhythm and normal heart sounds.   No murmur heard. Respiratory: Effort normal and breath sounds normal. No respiratory distress. She has no wheezes.  GI: Soft. Bowel sounds are normal.  Musculoskeletal: Normal range of motion.  Lymphadenopathy:    She has no cervical adenopathy.  Neurological: She is alert.  Skin: Skin is warm. No rash noted. No erythema.  Psychiatric: Her behavior is normal.   left breast incision clean  Assessment/Plan Left breast cancer  Will proceed with port a cath insertion, reexcision of the lumpectomy site in an  attempt to get negative margins and completion left axillary dissection.  I discussed the risks which include but are not limited to bleeding, infection, need for further surgery if margins are again positive, pneumothorax, injury to surrounding structures, arm swelling, DVT, use of drains, post op recovery, etc.  She agrees to proceed.  Nandika Stetzer A 08/15/2014, 8:57 PM

## 2014-08-16 ENCOUNTER — Ambulatory Visit (HOSPITAL_COMMUNITY): Payer: PRIVATE HEALTH INSURANCE

## 2014-08-16 ENCOUNTER — Encounter (HOSPITAL_BASED_OUTPATIENT_CLINIC_OR_DEPARTMENT_OTHER): Payer: Self-pay | Admitting: Certified Registered"

## 2014-08-16 ENCOUNTER — Ambulatory Visit (HOSPITAL_BASED_OUTPATIENT_CLINIC_OR_DEPARTMENT_OTHER): Payer: PRIVATE HEALTH INSURANCE | Admitting: Certified Registered"

## 2014-08-16 ENCOUNTER — Ambulatory Visit (HOSPITAL_BASED_OUTPATIENT_CLINIC_OR_DEPARTMENT_OTHER)
Admission: RE | Admit: 2014-08-16 | Discharge: 2014-08-17 | Disposition: A | Discharge status: Home / Self Care | Payer: PRIVATE HEALTH INSURANCE | Source: Ambulatory Visit | Attending: Surgery | Admitting: Surgery

## 2014-08-16 ENCOUNTER — Encounter (HOSPITAL_BASED_OUTPATIENT_CLINIC_OR_DEPARTMENT_OTHER)
Admission: RE | Disposition: A | Discharge status: Home / Self Care | Payer: Self-pay | Source: Ambulatory Visit | Attending: Surgery

## 2014-08-16 DIAGNOSIS — E785 Hyperlipidemia, unspecified: Secondary | ICD-10-CM | POA: Insufficient documentation

## 2014-08-16 DIAGNOSIS — Z95828 Presence of other vascular implants and grafts: Secondary | ICD-10-CM

## 2014-08-16 DIAGNOSIS — F329 Major depressive disorder, single episode, unspecified: Secondary | ICD-10-CM | POA: Insufficient documentation

## 2014-08-16 DIAGNOSIS — Z888 Allergy status to other drugs, medicaments and biological substances status: Secondary | ICD-10-CM | POA: Insufficient documentation

## 2014-08-16 DIAGNOSIS — C50912 Malignant neoplasm of unspecified site of left female breast: Secondary | ICD-10-CM | POA: Diagnosis present

## 2014-08-16 DIAGNOSIS — C50919 Malignant neoplasm of unspecified site of unspecified female breast: Secondary | ICD-10-CM | POA: Diagnosis present

## 2014-08-16 DIAGNOSIS — F419 Anxiety disorder, unspecified: Secondary | ICD-10-CM | POA: Diagnosis not present

## 2014-08-16 DIAGNOSIS — Z419 Encounter for procedure for purposes other than remedying health state, unspecified: Secondary | ICD-10-CM

## 2014-08-16 HISTORY — PX: PORTACATH PLACEMENT: SHX2246

## 2014-08-16 HISTORY — DX: Major depressive disorder, single episode, unspecified: F32.9

## 2014-08-16 HISTORY — PX: AXILLARY LYMPH NODE DISSECTION: SHX5229

## 2014-08-16 HISTORY — DX: Depression, unspecified: F32.A

## 2014-08-16 HISTORY — DX: Anxiety disorder, unspecified: F41.9

## 2014-08-16 HISTORY — PX: RE-EXCISION OF BREAST LUMPECTOMY: SHX6048

## 2014-08-16 SURGERY — EXCISION, LESION, BREAST
Anesthesia: General | Site: Chest | Laterality: Right

## 2014-08-16 MED ORDER — MIDAZOLAM HCL 2 MG/2ML IJ SOLN
INTRAMUSCULAR | Status: AC
  Filled 2014-08-16: qty 2

## 2014-08-16 MED ORDER — HYDROMORPHONE HCL 1 MG/ML IJ SOLN
INTRAMUSCULAR | Status: AC
  Filled 2014-08-16: qty 1

## 2014-08-16 MED ORDER — HEPARIN (PORCINE) IN NACL 2-0.9 UNIT/ML-% IJ SOLN
INTRAMUSCULAR | Status: DC | PRN
  Administered 2014-08-16: 1 via INTRAVENOUS

## 2014-08-16 MED ORDER — ONDANSETRON HCL 4 MG PO TABS: 4.0000 mg | ORAL_TABLET | Freq: Four times a day (QID) | ORAL | Status: DC | PRN

## 2014-08-16 MED ORDER — HYDROMORPHONE HCL 1 MG/ML IJ SOLN
0.2500 mg | INTRAMUSCULAR | Status: DC | PRN
Start: 2014-08-16 — End: 2014-08-17
  Administered 2014-08-16: 0.25 mg via INTRAVENOUS
  Administered 2014-08-16: 0.5 mg via INTRAVENOUS

## 2014-08-16 MED ORDER — OXYCODONE-ACETAMINOPHEN 5-325 MG PO TABS
1.0000 | ORAL_TABLET | ORAL | Status: DC | PRN
  Administered 2014-08-16 – 2014-08-17 (×3): 1 via ORAL
  Filled 2014-08-16: qty 1
  Filled 2014-08-16: qty 2

## 2014-08-16 MED ORDER — FENTANYL CITRATE (PF) 100 MCG/2ML IJ SOLN
INTRAMUSCULAR | Status: AC
  Filled 2014-08-16: qty 2

## 2014-08-16 MED ORDER — BUPIVACAINE HCL (PF) 0.25 % IJ SOLN
INTRAMUSCULAR | Status: AC
  Filled 2014-08-16: qty 30

## 2014-08-16 MED ORDER — ENOXAPARIN SODIUM 40 MG/0.4ML ~~LOC~~ SOLN: 40.0000 mg | SUBCUTANEOUS | Status: DC

## 2014-08-16 MED ORDER — BUPIVACAINE-EPINEPHRINE (PF) 0.5% -1:200000 IJ SOLN
INTRAMUSCULAR | Status: DC | PRN
  Administered 2014-08-16: 28 mL via PERINEURAL

## 2014-08-16 MED ORDER — KETOROLAC TROMETHAMINE 30 MG/ML IJ SOLN
INTRAMUSCULAR | Status: DC | PRN
  Administered 2014-08-16: 30 mg via INTRAVENOUS

## 2014-08-16 MED ORDER — LIDOCAINE HCL (CARDIAC) 20 MG/ML IV SOLN
INTRAVENOUS | Status: DC | PRN
  Administered 2014-08-16: 30 mg via INTRAVENOUS

## 2014-08-16 MED ORDER — ONDANSETRON HCL 4 MG/2ML IJ SOLN
INTRAMUSCULAR | Status: DC | PRN
  Administered 2014-08-16: 4 mg via INTRAVENOUS

## 2014-08-16 MED ORDER — SODIUM CHLORIDE 0.9 % IV SOLN: INTRAVENOUS | Status: DC

## 2014-08-16 MED ORDER — BUPIVACAINE-EPINEPHRINE (PF) 0.25% -1:200000 IJ SOLN
INTRAMUSCULAR | Status: AC
  Filled 2014-08-16: qty 30

## 2014-08-16 MED ORDER — SODIUM CHLORIDE 0.9 % IV SOLN
INTRAVENOUS | Status: DC
  Administered 2014-08-16 (×2): via INTRAVENOUS

## 2014-08-16 MED ORDER — HEPARIN SOD (PORK) LOCK FLUSH 100 UNIT/ML IV SOLN
INTRAVENOUS | Status: DC | PRN
  Administered 2014-08-16: 500 [IU] via INTRAVENOUS

## 2014-08-16 MED ORDER — SCOPOLAMINE 1 MG/3DAYS TD PT72
MEDICATED_PATCH | TRANSDERMAL | Status: DC | PRN
  Administered 2014-08-16: 1 via TRANSDERMAL

## 2014-08-16 MED ORDER — ONDANSETRON HCL 4 MG/2ML IJ SOLN: 4.0000 mg | Freq: Four times a day (QID) | INTRAMUSCULAR | Status: DC | PRN

## 2014-08-16 MED ORDER — LACTATED RINGERS IV SOLN
INTRAVENOUS | Status: DC
  Administered 2014-08-16 (×2): via INTRAVENOUS

## 2014-08-16 MED ORDER — SODIUM CHLORIDE 0.9 % IJ SOLN
INTRAMUSCULAR | Status: AC
  Filled 2014-08-16: qty 10

## 2014-08-16 MED ORDER — DEXAMETHASONE SODIUM PHOSPHATE 4 MG/ML IJ SOLN
INTRAMUSCULAR | Status: DC | PRN
  Administered 2014-08-16: 10 mg via INTRAVENOUS

## 2014-08-16 MED ORDER — CEFAZOLIN SODIUM-DEXTROSE 2-3 GM-% IV SOLR
2.0000 g | INTRAVENOUS | Status: AC
  Administered 2014-08-16: 2 g via INTRAVENOUS

## 2014-08-16 MED ORDER — HEPARIN SOD (PORK) LOCK FLUSH 100 UNIT/ML IV SOLN
INTRAVENOUS | Status: AC
  Filled 2014-08-16: qty 5

## 2014-08-16 MED ORDER — SCOPOLAMINE 1 MG/3DAYS TD PT72
MEDICATED_PATCH | TRANSDERMAL | Status: AC
  Filled 2014-08-16: qty 1

## 2014-08-16 MED ORDER — FENTANYL CITRATE (PF) 100 MCG/2ML IJ SOLN
INTRAMUSCULAR | Status: AC
  Filled 2014-08-16: qty 6

## 2014-08-16 MED ORDER — HEPARIN (PORCINE) IN NACL 2-0.9 UNIT/ML-% IJ SOLN
INTRAMUSCULAR | Status: AC
  Filled 2014-08-16: qty 500

## 2014-08-16 MED ORDER — MIDAZOLAM HCL 2 MG/2ML IJ SOLN
1.0000 mg | INTRAMUSCULAR | Status: DC | PRN
  Administered 2014-08-16: 2 mg via INTRAVENOUS
  Administered 2014-08-16: 1 mg via INTRAVENOUS
  Administered 2014-08-16: 2 mg via INTRAVENOUS

## 2014-08-16 MED ORDER — EPHEDRINE SULFATE 50 MG/ML IJ SOLN
INTRAMUSCULAR | Status: DC | PRN
  Administered 2014-08-16: 10 mg via INTRAVENOUS

## 2014-08-16 MED ORDER — BUPIVACAINE-EPINEPHRINE 0.5% -1:200000 IJ SOLN
INTRAMUSCULAR | Status: DC | PRN
  Administered 2014-08-16: 21 mL

## 2014-08-16 MED ORDER — FENTANYL CITRATE (PF) 100 MCG/2ML IJ SOLN
50.0000 ug | INTRAMUSCULAR | Status: DC | PRN
  Administered 2014-08-16: 100 ug via INTRAVENOUS
  Administered 2014-08-16: 50 ug via INTRAVENOUS
  Administered 2014-08-16: 25 ug via INTRAVENOUS

## 2014-08-16 MED ORDER — GLYCOPYRROLATE 0.2 MG/ML IJ SOLN
0.2000 mg | Freq: Once | INTRAMUSCULAR | Status: DC | PRN
Start: 2014-08-16 — End: 2014-08-16

## 2014-08-16 MED ORDER — METHYLENE BLUE 1 % INJ SOLN
INTRAMUSCULAR | Status: AC
  Filled 2014-08-16: qty 10

## 2014-08-16 MED ORDER — OXYCODONE HCL 5 MG/5ML PO SOLN: 5.0000 mg | Freq: Once | ORAL | Status: AC | PRN

## 2014-08-16 MED ORDER — CEFAZOLIN SODIUM-DEXTROSE 2-3 GM-% IV SOLR
INTRAVENOUS | Status: AC
  Filled 2014-08-16: qty 50

## 2014-08-16 MED ORDER — MEPERIDINE HCL 25 MG/ML IJ SOLN: 6.2500 mg | INTRAMUSCULAR | Status: DC | PRN

## 2014-08-16 MED ORDER — MORPHINE SULFATE 2 MG/ML IJ SOLN: 1.0000 mg | INTRAMUSCULAR | Status: DC | PRN

## 2014-08-16 MED ORDER — PROPOFOL 10 MG/ML IV BOLUS
INTRAVENOUS | Status: DC | PRN
  Administered 2014-08-16: 200 mg via INTRAVENOUS

## 2014-08-16 MED ORDER — OXYCODONE HCL 5 MG PO TABS: 5.0000 mg | ORAL_TABLET | Freq: Once | ORAL | Status: AC | PRN

## 2014-08-16 SURGICAL SUPPLY — 58 items
APPLIER CLIP 9.375 MED OPEN (MISCELLANEOUS) ×8
BAG DECANTER FOR FLEXI CONT (MISCELLANEOUS) ×4 IMPLANT
BINDER BREAST LRG (GAUZE/BANDAGES/DRESSINGS) ×4 IMPLANT
BLADE HEX COATED 2.75 (ELECTRODE) ×4 IMPLANT
BLADE SURG 15 STRL LF DISP TIS (BLADE) ×6 IMPLANT
BLADE SURG 15 STRL SS (BLADE) ×2
CANISTER SUCT 1200ML W/VALVE (MISCELLANEOUS) ×4 IMPLANT
CHLORAPREP W/TINT 26ML (MISCELLANEOUS) ×8 IMPLANT
CLIP APPLIE 9.375 MED OPEN (MISCELLANEOUS) ×6 IMPLANT
COVER BACK TABLE 60X90IN (DRAPES) ×4 IMPLANT
COVER MAYO STAND STRL (DRAPES) ×4 IMPLANT
DECANTER SPIKE VIAL GLASS SM (MISCELLANEOUS) IMPLANT
DRAIN CHANNEL 19F RND (DRAIN) ×4 IMPLANT
DRAIN PENROSE 1/4X12 LTX STRL (WOUND CARE) IMPLANT
DRAPE C-ARM 42X72 X-RAY (DRAPES) ×4 IMPLANT
DRAPE LAPAROSCOPIC ABDOMINAL (DRAPES) ×4 IMPLANT
DRAPE LAPAROTOMY 100X72 PEDS (DRAPES) ×4 IMPLANT
DRAPE UTILITY XL STRL (DRAPES) ×8 IMPLANT
DRSG PAD ABDOMINAL 8X10 ST (GAUZE/BANDAGES/DRESSINGS) ×4 IMPLANT
DRSG TEGADERM 4X4.75 (GAUZE/BANDAGES/DRESSINGS) ×12 IMPLANT
ELECT REM PT RETURN 9FT ADLT (ELECTROSURGICAL) ×4
ELECTRODE REM PT RTRN 9FT ADLT (ELECTROSURGICAL) ×3 IMPLANT
EVACUATOR SILICONE 100CC (DRAIN) ×4 IMPLANT
GLOVE BIO SURGEON STRL SZ 6.5 (GLOVE) ×8 IMPLANT
GLOVE BIOGEL PI IND STRL 7.0 (GLOVE) ×6 IMPLANT
GLOVE BIOGEL PI INDICATOR 7.0 (GLOVE) ×2
GLOVE ECLIPSE 6.5 STRL STRAW (GLOVE) ×8 IMPLANT
GLOVE SURG SIGNA 7.5 PF LTX (GLOVE) ×8 IMPLANT
GOWN STRL REUS W/ TWL LRG LVL3 (GOWN DISPOSABLE) ×6 IMPLANT
GOWN STRL REUS W/ TWL XL LVL3 (GOWN DISPOSABLE) ×6 IMPLANT
GOWN STRL REUS W/TWL LRG LVL3 (GOWN DISPOSABLE) ×2
GOWN STRL REUS W/TWL XL LVL3 (GOWN DISPOSABLE) ×2
IV KIT MINILOC 20X1 SAFETY (NEEDLE) IMPLANT
KIT PORT POWER 8FR ISP CVUE (Catheter) ×4 IMPLANT
LIQUID BAND (GAUZE/BANDAGES/DRESSINGS) ×12 IMPLANT
NEEDLE HYPO 25X1 1.5 SAFETY (NEEDLE) ×4 IMPLANT
NS IRRIG 1000ML POUR BTL (IV SOLUTION) ×4 IMPLANT
PACK BASIN DAY SURGERY FS (CUSTOM PROCEDURE TRAY) ×4 IMPLANT
PENCIL BUTTON HOLSTER BLD 10FT (ELECTRODE) ×4 IMPLANT
PIN SAFETY STERILE (MISCELLANEOUS) ×4 IMPLANT
SLEEVE SCD COMPRESS KNEE MED (MISCELLANEOUS) ×4 IMPLANT
SPONGE GAUZE 4X4 12PLY STER LF (GAUZE/BANDAGES/DRESSINGS) ×4 IMPLANT
SPONGE LAP 4X18 X RAY DECT (DISPOSABLE) ×12 IMPLANT
STAPLER VISISTAT 35W (STAPLE) IMPLANT
STRIP CLOSURE SKIN 1/2X4 (GAUZE/BANDAGES/DRESSINGS) ×4 IMPLANT
SUT ETHILON 2 0 FS 18 (SUTURE) ×8 IMPLANT
SUT MNCRL AB 4-0 PS2 18 (SUTURE) ×8 IMPLANT
SUT PROLENE 2 0 SH DA (SUTURE) ×8 IMPLANT
SUT SILK 2 0 TIES 17X18 (SUTURE)
SUT SILK 2-0 18XBRD TIE BLK (SUTURE) IMPLANT
SUT VIC AB 3-0 SH 27 (SUTURE) ×2
SUT VIC AB 3-0 SH 27X BRD (SUTURE) ×6 IMPLANT
SYR BULB 3OZ (MISCELLANEOUS) ×4 IMPLANT
SYR CONTROL 10ML LL (SYRINGE) ×4 IMPLANT
TOWEL OR 17X24 6PK STRL BLUE (TOWEL DISPOSABLE) ×4 IMPLANT
TOWEL OR NON WOVEN STRL DISP B (DISPOSABLE) ×4 IMPLANT
TUBE CONNECTING 20X1/4 (TUBING) ×4 IMPLANT
YANKAUER SUCT BULB TIP NO VENT (SUCTIONS) ×4 IMPLANT

## 2014-08-16 NOTE — Progress Notes (Signed)
Assisted Dr. Crews with left, ultrasound guided, pectoralis block. Side rails up, monitors on throughout procedure. See vital signs in flow sheet. Tolerated Procedure well. 

## 2014-08-16 NOTE — Op Note (Signed)
RE-EXCISION OF LEFT BREAST CANCER AND PORT PLACEMENT/POSSIBLE AXILLARY DISECTION, INSERTION PORT-A-CATH, AXILLARY LYMPH NODE DISSECTION  Procedure Note  [name] 08/16/2014   Pre-op Diagnosis: Left Breast Cancer     Post-op Diagnosis: same  Procedure(s): RE-EXCISION OF LEFT BREAST CANCER AND PORT PLACEMENT/POSSIBLE AXILLARY DISECTION INSERTION PORT-A-CATH AXILLARY LYMPH NODE DISSECTION  Surgeon(s): Coralie Keens, MD  Anesthesia: General  Staff:  Circulator: Vara Guardian, RN Radiology Technologist: Betsey Holiday Scrub Person: Theressa Stamps, CST  Estimated Blood Loss: Minimal               Specimens: sent to path          [facility] A   Date: 08/16/2014  Time: 12:50 PM

## 2014-08-16 NOTE — Transfer of Care (Signed)
Immediate Anesthesia Transfer of Care Note  Patient: [name]  Procedure(s) Performed: Procedure(s): RE-EXCISION OF LEFT BREAST CANCER AND PORT PLACEMENT/POSSIBLE AXILLARY DISECTION (Left) INSERTION PORT-A-CATH (Right) AXILLARY LYMPH NODE DISSECTION (Left)  Patient Location: PACU  Anesthesia Type:GA combined with regional for post-op pain  Level of Consciousness: awake, alert , oriented and patient cooperative  Airway & Oxygen Therapy: Patient Spontanous Breathing and Patient connected to face mask oxygen  Post-op Assessment: Report given to RN and Post -op Vital signs reviewed and stable  Post vital signs: Reviewed and stable  Last Vitals:  Filed Vitals:   08/16/14 1105  BP: 187/96  Pulse: 107  Temp:   Resp: 20    Complications: No apparent anesthesia complications

## 2014-08-16 NOTE — Anesthesia Postprocedure Evaluation (Signed)
  Anesthesia Post-op Note  Patient: [name]  Procedure(s) Performed: Procedure(s): RE-EXCISION OF LEFT BREAST CANCER AND PORT PLACEMENT/POSSIBLE AXILLARY DISECTION (Left) INSERTION PORT-A-CATH (Right) AXILLARY LYMPH NODE DISSECTION (Left)  Patient Location: PACU  Anesthesia Type: General, regional   Level of Consciousness: awake, alert  and oriented  Airway and Oxygen Therapy: Patient Spontanous Breathing  Post-op Pain: mild  Post-op Assessment: Post-op Vital signs reviewed  Post-op Vital Signs: Reviewed  Last Vitals:  Filed Vitals:   08/16/14 1415  BP: 141/84  Pulse: 97  Temp: 36.6 C  Resp: 14    Complications: No apparent anesthesia complications

## 2014-08-16 NOTE — Anesthesia Procedure Notes (Addendum)
Anesthesia Regional Block:  Pectoralis block  Pre-Anesthetic Checklist: ,, timeout performed, Correct Patient, Correct Site, Correct Laterality, Correct Procedure, Correct Position, site marked, Risks and benefits discussed,  Surgical consent,  Pre-op evaluation,  At surgeon's request and post-op pain management  Laterality: Left and Upper  Prep: chloraprep       Needles:  Injection technique: Single-shot  Needle Type: Echogenic Needle     Needle Length: 9cm 9 cm Needle Gauge: 21 and 21 G    Additional Needles:  Procedures: ultrasound guided (picture in chart) Pectoralis block Narrative:  Start time: 08/16/2014 11:00 AM End time: 08/16/2014 11:06 AM Injection made incrementally with aspirations every 5 mL.  Performed by: Personally  Anesthesiologist: CREWS, DAVID   Procedure Name: LMA Insertion Date/Time: 08/16/2014 11:14 AM Performed by: Isaack Preble D Pre-anesthesia Checklist: Patient identified, Emergency Drugs available, Suction available and Patient being monitored Patient Re-evaluated:Patient Re-evaluated prior to inductionOxygen Delivery Method: Circle System Utilized Preoxygenation: Pre-oxygenation with 100% oxygen Intubation Type: IV induction Ventilation: Mask ventilation without difficulty LMA: LMA inserted LMA Size: 4.0 Number of attempts: 1 Airway Equipment and Method: Bite block Placement Confirmation: positive ETCO2 Tube secured with: Tape Dental Injury: Teeth and Oropharynx as per pre-operative assessment

## 2014-08-16 NOTE — Anesthesia Preprocedure Evaluation (Addendum)
Anesthesia Evaluation  Patient identified by MRN, date of birth, ID band Patient awake    Reviewed: Allergy & Precautions, NPO status   Airway Mallampati: I  TM Distance: >3 FB Neck ROM: Full    Dental  (+) Teeth Intact, Dental Advisory Given   Pulmonary  breath sounds clear to auscultation        Cardiovascular Rhythm:Regular Rate:Normal     Neuro/Psych PSYCHIATRIC DISORDERS Anxiety Depression    GI/Hepatic   Endo/Other    Renal/GU      Musculoskeletal   Abdominal   Peds  Hematology   Anesthesia Other Findings   Reproductive/Obstetrics                            Anesthesia Physical Anesthesia Plan  ASA: II  Anesthesia Plan: General and Regional   Post-op Pain Management:    Induction: Intravenous  Airway Management Planned: LMA  Additional Equipment:   Intra-op Plan:   Post-operative Plan: Extubation in OR  Informed Consent: I have reviewed the patients History and Physical, chart, labs and discussed the procedure including the risks, benefits and alternatives for the proposed anesthesia with the patient or authorized representative who has indicated his/her understanding and acceptance.   Dental advisory given  Plan Discussed with: CRNA, Anesthesiologist and Surgeon  Anesthesia Plan Comments:         Anesthesia Quick Evaluation

## 2014-08-17 ENCOUNTER — Encounter (HOSPITAL_BASED_OUTPATIENT_CLINIC_OR_DEPARTMENT_OTHER): Payer: Self-pay | Admitting: Surgery

## 2014-08-17 DIAGNOSIS — C50912 Malignant neoplasm of unspecified site of left female breast: Secondary | ICD-10-CM | POA: Diagnosis not present

## 2014-08-17 MED ORDER — HYDROCODONE-ACETAMINOPHEN 5-325 MG PO TABS: 1.0000 | ORAL_TABLET | ORAL | Status: DC | PRN

## 2014-08-17 NOTE — Discharge Instructions (Signed)
[facility] °Office Phone Number [phone] ° °BREAST BIOPSY/ PARTIAL MASTECTOMY: POST OP INSTRUCTIONS ° °Always review your discharge instruction sheet given to you by the facility where your surgery was performed. ° °IF YOU HAVE DISABILITY OR FAMILY LEAVE FORMS, YOU MUST BRING THEM TO THE OFFICE FOR PROCESSING.  DO NOT GIVE THEM TO YOUR DOCTOR. ° °1. A prescription for pain medication may be given to you upon discharge.  Take your pain medication as prescribed, if needed.  If narcotic pain medicine is not needed, then you may take acetaminophen (Tylenol) or ibuprofen (Advil) as needed. °2. Take your usually prescribed medications unless otherwise directed °3. If you need a refill on your pain medication, please contact your pharmacy.  They will contact our office to request authorization.  Prescriptions will not be filled after 5pm or on week-ends. °4. You should eat very light the first 24 hours after surgery, such as soup, crackers, pudding, etc.  Resume your normal diet the day after surgery. °5. Most patients will experience some swelling and bruising in the breast.  Ice packs and a good support bra will help.  Swelling and bruising can take several days to resolve.  °6. It is common to experience some constipation if taking pain medication after surgery.  Increasing fluid intake and taking a stool softener will usually help or prevent this problem from occurring.  A mild laxative (Milk of Magnesia or Miralax) should be taken according to package directions if there are no bowel movements after 48 hours. °7. Unless discharge instructions indicate otherwise, you may remove your bandages 24-48 hours after surgery, and you may shower at that time.  You may have steri-strips (small skin tapes) in place directly over the incision.  These strips should be left on the skin for 7-10 days.  If your surgeon used skin glue on the incision, you may shower in 24 hours.  The glue will flake off over the  next 2-3 weeks.  Any sutures or staples will be removed at the office during your follow-up visit. °8. ACTIVITIES:  You may resume regular daily activities (gradually increasing) beginning the next day.  Wearing a good support bra or sports bra minimizes pain and swelling.  You may have sexual intercourse when it is comfortable. °a. You may drive when you no longer are taking prescription pain medication, you can comfortably wear a seatbelt, and you can safely maneuver your car and apply brakes. °b. RETURN TO WORK:  ______________________________________________________________________________________ °9. You should see your doctor in the office for a follow-up appointment approximately two weeks after your surgery.  Your doctor’s nurse will typically make your follow-up appointment when she calls you with your pathology report.  Expect your pathology report 2-3 business days after your surgery.  You may call to check if you do not hear from us after three days. °10. OTHER INSTRUCTIONS: _______________________________________________________________________________________________ _____________________________________________________________________________________________________________________________________ °_____________________________________________________________________________________________________________________________________ °_____________________________________________________________________________________________________________________________________ ° °WHEN TO CALL YOUR DOCTOR: °1. Fever over 101.0 °2. Nausea and/or vomiting. °3. Extreme swelling or bruising. °4. Continued bleeding from incision. °5. Increased pain, redness, or drainage from the incision. ° °The clinic staff is available to answer your questions during regular business hours.  Please don’t hesitate to call and ask to speak to one of the nurses for clinical concerns.  If you have a medical emergency, go to the nearest  emergency room or call 911.  A surgeon from [facility] is always on call at the hospital. ° °For further questions, please visit [email or web address]  °

## 2014-08-17 NOTE — Progress Notes (Signed)
Patient ID: [name], female   DOB: October 03, 1960, 54 y.o.   MRN: [id number]  Doing well  Drain serosang  Discharge home

## 2014-08-17 NOTE — Op Note (Signed)
[name], [name]:  [id number]  MEDICAL RECORD NO.:  [id number]  LOCATION:  XRAY                         FACILITY:  [facility]  PHYSICIAN:  Coralie Keens, M.D. DATE OF BIRTH:  07/13/1960  DATE OF PROCEDURE:  08/16/2014 DATE OF DISCHARGE:                              OPERATIVE REPORT   PREOPERATIVE DIAGNOSIS:  Left breast cancer.  POSTOPERATIVE DIAGNOSIS:  Left breast cancer.  PROCEDURES: 1. Right subclavian Port-A-Cath insertion. 2. Re-excision of left breast cancer. 3. Completion left axillary lymph node dissection.  SURGEON:  Coralie Keens, M.D.  ANESTHESIA:  General and 0.5% Marcaine with pectoralis block.  ESTIMATED BLOOD LOSS:  Minimal.  INDICATIONS:  This is a 54 year old female who underwent a partial mastectomy of the left breast with sentinel lymph node biopsy approximately a week ago.  She was positive at the medial and inferior margins as well as had 3 positive sentinel lymph nodes.  Decision was made to proceed with Port-A-Cath insertion, re-excision to achieve negative margins, and complete axillary dissection after discussion with the oncologist.  PROCEDURE IN DETAIL:  The patient was brought to the operating room, identified as [name].  She was placed supine on the operating room table and general anesthesia was induced.  Her right chest and neck were then prepped and draped in usual sterile fashion.  I anesthetized the skin of the chest and clavicle with Marcaine.  I then used an introducer needle to cannulate easily the right subclavian vein.  I then passed a wire through the needle and into the central venous system under fluoroscopy.  I then anesthetized the skin around the insertion site further with Marcaine.  I made a longitudinal incision with a scalpel and took this down to the chest tissue with the cautery.  I then created a pocket for the Port-A-Cath.  A clear view port was then brought out to the field.  I  placed the introducer sheath and dilator over the wire and into the central venous system.  I then attached the catheter to the port and cut it appropriate length.  I then placed the port into the pocket and fed the catheter down the peel-away sheath. The sheath was then peeled away, leaving the catheter in central venous system.  I then accessed the port and good flush and return were demonstrated.  It was found to be in the superior vena cava on fluoroscopy.  I then instilled concentrated heparin solution into the port.  It was sewn in place to the chest wall with 2 separate 3-0 Prolene sutures.  I then closed the subcutaneous tissue with interrupted 3-0 Vicryl sutures and closed the skin with running 4-0 Monocryl. Occlusive dressing was then applied.  Next, her left breast and axilla were prepped and draped in usual sterile fashion.  I anesthetized the skin on the previous scar of the left breast with Marcaine.  I then opened an incision with a scalpel, taking this into the seroma cavity. I then first excised the previous medial margin with electrocautery for several centimeters, sent it to Pathology for evaluation.  I then likewise excised the medial margin with cautery as well.  Both these were taken down to  the chest wall.  Both margins were again sent to Pathology separately.  I then irrigated the wound with saline and placed more surgical clips into the incision.  Next, I anesthetized the skin in the axilla with Marcaine as well.  I then opened the previous sentinel lymph node incision made this larger with the scalpel.  I then took this down to the axillary tissue with electrocautery.  I then performed a completion axillary dissection.  I stayed underneath the axillary vein and was able to easily identify the thoracodorsal complex and nerves. Several small bridging veins and lymphatics were clipped with surgical clips.  I stayed along the chest wall as well.  I identified the  long thoracic nerve.  Once the complete nodal package was removed, it was sent to Pathology for evaluation.  I then thoroughly irrigated the axilla with saline.  I again identified the nerves and vessels and found to be intact.  I made a separate skin incision and placed a 19-French Blake drain into the axilla.  This was sewn in place with a nylon suture.  I then closed subcutaneous tissue with interrupted 3-0 Vicryl sutures and closed the skin with a running 4-0 Monocryl.  Likewise on the breast, I closed the subcutaneous tissue with interrupted 3-0 Vicryl sutures and closed the skin with running 4-0 Monocryl.  Skin glue was then placed to both incisions.  Gauze was placed over this and the patient was placed in a breast binder.  The patient tolerated the procedure well.  All the counts were correct at the end of the procedure.  The patient was then extubated in the operating room and taken in stable condition to the recovery room.     Coralie Keens, M.D.     DB/MEDQ  D:  08/16/2014  T:  08/17/2014  Job:  [id number]

## 2014-08-17 NOTE — Discharge Summary (Signed)
Physician Discharge Summary  Patient ID: TECORA EUSTACHE MRN: [id number] DOB/AGE: [birth year] 54 y.o.  Admit date: 08/16/2014 Discharge date: 08/17/2014  Admission Diagnoses:  Discharge Diagnoses:  Active Problems:   Breast cancer   Discharged Condition: good  Hospital Course: uneventful post op recovery  Consults: None  Significant Diagnostic Studies:   Treatments: surgery:   Discharge Exam: Blood pressure 105/64, pulse 82, temperature 97.8 F (36.6 C), temperature source Oral, resp. rate 16, height '5\' 5"'$  (1.651 m), weight 65.318 kg (144 lb), SpO2 97 %. General appearance: alert, cooperative and no distress Incision/Wound:no hematoma. Incisions ok  Disposition: 01-Home or Self Care     Medication List    TAKE these medications        acetaminophen 500 MG tablet  Commonly known as:  TYLENOL  Take 500 mg by mouth every 6 (six) hours as needed for pain.     clonazePAM 0.5 MG tablet  Commonly known as:  KLONOPIN  TAKE 1 TABLET THREE TIMES A DAY     HYDROcodone-acetaminophen 5-325 MG per tablet  Commonly known as:  NORCO  Take 1-2 tablets by mouth every 4 (four) hours as needed.     PARoxetine 25 MG 24 hr tablet  Commonly known as:  PAXIL-CR  TAKE 1 TABLET BY MOUTH EVERY DAY           Follow-up Information    Follow up with [facility], MD. Schedule an appointment as soon as possible for a visit on 08/30/2014.   Specialty:  General Surgery   Why:  For drain check   Contact information:   [address] [place] [address] [phone]       Signed: Harl Bowie 08/17/2014, 6:32 AM

## 2014-08-18 ENCOUNTER — Telehealth: Payer: Self-pay | Admitting: *Deleted

## 2014-08-18 ENCOUNTER — Telehealth: Payer: Self-pay | Admitting: Family Medicine

## 2014-08-18 NOTE — Telephone Encounter (Signed)
Tc from patient checking to see if her pathology results were back from her breast surgery done on 08/16/14. No results back yet per EPIC chart. Pt disappointed. Encouraged her to call early next week as it usually takes takes more than a couple of days for path report to come back. [name] verbalized understanding.

## 2014-08-20 NOTE — Telephone Encounter (Signed)
[name], please talk to this patient if you can and if she has to talk to me please get her on the phone and I will be glad to talk with her

## 2014-08-21 ENCOUNTER — Encounter: Payer: Self-pay | Admitting: *Deleted

## 2014-08-21 NOTE — Telephone Encounter (Signed)
dwm - aware to call pt

## 2014-08-22 ENCOUNTER — Telehealth: Payer: Self-pay | Admitting: Hematology and Oncology

## 2014-08-22 NOTE — Telephone Encounter (Signed)
Called and left a message with 6.21 follow up

## 2014-08-28 ENCOUNTER — Telehealth: Payer: Self-pay | Admitting: Family Medicine

## 2014-08-28 ENCOUNTER — Other Ambulatory Visit: Payer: Self-pay | Admitting: Family Medicine

## 2014-08-28 ENCOUNTER — Encounter (HOSPITAL_BASED_OUTPATIENT_CLINIC_OR_DEPARTMENT_OTHER): Payer: Self-pay | Admitting: Surgery

## 2014-08-28 MED ORDER — PAROXETINE HCL ER 25 MG PO TB24: ORAL_TABLET | ORAL | Status: DC

## 2014-08-28 MED ORDER — CLONAZEPAM 0.5 MG PO TABS: ORAL_TABLET | ORAL | Status: DC

## 2014-08-28 NOTE — Telephone Encounter (Signed)
Called patient, but she requests to speak to [place].

## 2014-08-28 NOTE — Addendum Note (Signed)
Addended by: Zannie Cove on: 08/28/2014 02:49 PM   Modules accepted: Orders

## 2014-08-28 NOTE — Telephone Encounter (Signed)
Last seen 02/07/15 B [place]  If approved route to nurse to call into [facility]

## 2014-08-28 NOTE — Telephone Encounter (Signed)
ntbs

## 2014-08-29 ENCOUNTER — Other Ambulatory Visit: Payer: Self-pay | Admitting: *Deleted

## 2014-08-29 ENCOUNTER — Ambulatory Visit (HOSPITAL_BASED_OUTPATIENT_CLINIC_OR_DEPARTMENT_OTHER): Payer: PRIVATE HEALTH INSURANCE | Admitting: Hematology and Oncology

## 2014-08-29 VITALS — BP 139/73 | HR 83 | Temp 98.5°F | Resp 18 | Ht 65.0 in | Wt 144.4 lb

## 2014-08-29 DIAGNOSIS — C773 Secondary and unspecified malignant neoplasm of axilla and upper limb lymph nodes: Secondary | ICD-10-CM

## 2014-08-29 DIAGNOSIS — Z170 Estrogen receptor positive status [ER+]: Secondary | ICD-10-CM | POA: Diagnosis not present

## 2014-08-29 DIAGNOSIS — C50412 Malignant neoplasm of upper-outer quadrant of left female breast: Secondary | ICD-10-CM | POA: Diagnosis not present

## 2014-08-29 MED ORDER — LIDOCAINE-PRILOCAINE 2.5-2.5 % EX CREA: TOPICAL_CREAM | CUTANEOUS | Status: DC

## 2014-08-29 MED ORDER — DEXAMETHASONE 4 MG PO TABS: ORAL_TABLET | ORAL | Status: DC

## 2014-08-29 MED ORDER — LORAZEPAM 0.5 MG PO TABS: 0.5000 mg | ORAL_TABLET | Freq: Every day | ORAL | Status: DC

## 2014-08-29 MED ORDER — PROCHLORPERAZINE MALEATE 10 MG PO TABS: 10.0000 mg | ORAL_TABLET | Freq: Four times a day (QID) | ORAL | Status: DC | PRN

## 2014-08-29 MED ORDER — ONDANSETRON HCL 8 MG PO TABS: 8.0000 mg | ORAL_TABLET | Freq: Two times a day (BID) | ORAL | Status: DC | PRN

## 2014-08-29 NOTE — Assessment & Plan Note (Signed)
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 Recommendation: 1. Adjuvant chemotherapy with dose dense Adriamycin and Cytoxan x 4 followed by Abraxane weekly 12 because of lymph node positive disease. 2. Followed by adjuvant radiation 3. Followed by antiestrogen therapy  Plan: 1. Port placement was during the repeat surgery 2. Echocardiogram 3. Chemotherapy class 4. Plan to start chemotherapy 2 to 3 weeks

## 2014-08-29 NOTE — Progress Notes (Signed)
Patient Care Team: Chipper Herb, MD as PCP - General (Family Medicine)  DIAGNOSIS: No matching staging information was found for the patient.  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 Final outcome was that she has a total of 4 out of 10 positive lymph nodes. She has been healing very well from the surgery area she had a drain which was removed and that area had blood for a while until she put pressure dressing on it. Finally the drainage and the bleeding halves decreased. She is still very sore underneath the breasts.  REVIEW OF SYSTEMS:   Constitutional: Denies fevers, chills or abnormal weight loss Eyes: Denies blurriness of vision Ears, nose, mouth, throat, and face: Denies mucositis or sore throat Respiratory: Denies cough, dyspnea or wheezes Cardiovascular: Denies palpitation, chest discomfort or lower extremity swelling Gastrointestinal:   Denies nausea, heartburn or change in bowel habits Skin: Denies abnormal skin rashes Lymphatics: Denies new lymphadenopathy or easy bruising Neurological:Denies numbness, tingling or new weaknesses Behavioral/Psych: Mood is stable, no new changes  Breast: Soreness in the left axilla from recent surgery All other systems were reviewed with the patient and are negative.  I have reviewed the past medical history, past surgical history, social history and family history with the patient and they are unchanged from previous note.  ALLERGIES:  is allergic to crestor; lipitor; and temazepam.  MEDICATIONS:  Current Outpatient Prescriptions  Medication Sig Dispense Refill  . acetaminophen (TYLENOL) 500 MG tablet Take 500 mg by mouth every 6 (six) hours as needed for pain.    . cephALEXin (KEFLEX) 500 MG capsule TAKE ONE CAPSULE BY MOUTH 4 TIMES A DAY  0  . clonazePAM (KLONOPIN) 0.5 MG tablet TAKE 1 TABLET THREE TIMES A DAY 90 tablet 2  . PARoxetine (PAXIL-CR) 25 MG 24 hr tablet TAKE 1 TABLET BY MOUTH EVERY DAY 90 tablet 2   No current facility-administered medications for this visit.    PHYSICAL EXAMINATION: ECOG PERFORMANCE STATUS: 1 - Symptomatic but completely ambulatory  Filed Vitals:   08/29/14 1555  BP: 139/73  Pulse: 83  Temp: 98.5 F (36.9 C)  Resp: 18   Filed Weights   08/29/14 1555  Weight: 144 lb 6.4 oz (65.499 kg)    GENERAL:alert, no distress and comfortable SKIN: skin color, texture, turgor are normal, no rashes or significant lesions EYES: normal, Conjunctiva are pink and non-injected, sclera clear OROPHARYNX:no exudate, no erythema and lips, buccal  mucosa, and tongue normal  NECK: supple, thyroid normal size, non-tender, without nodularity LYMPH:  no palpable lymphadenopathy in the cervical, axillary or inguinal LUNGS: clear to auscultation and percussion with normal breathing effort HEART: regular rate & rhythm and no murmurs and no lower extremity  edema ABDOMEN:abdomen soft, non-tender and normal bowel sounds Musculoskeletal:no cyanosis of digits and no clubbing  NEURO: alert & oriented x 3 with fluent speech, no focal motor/sensory deficits BREAST: Left axilla has healed quite well with a small opening without any evidence of redness or discharge.   LABORATORY DATA:  I have reviewed the data as listed   Chemistry      Component Value Date/Time   NA 141 07/21/2013 1023   NA 139 10/04/2012 1123   K 4.0 07/21/2013 1023   CL 100 07/21/2013 1023   CO2 26 07/21/2013 1023   BUN 14 07/21/2013 1023   BUN 10 10/04/2012 1123   CREATININE 0.45* 07/21/2013 1023   CREATININE 0.54 10/04/2012 1123      Component Value Date/Time   CALCIUM 8.8 07/21/2013 1023   ALKPHOS 53 07/21/2013 1023   AST 15 07/21/2013 1023   ALT 14 07/21/2013 1023   BILITOT 0.5 07/21/2013 1023       Lab Results  Component Value Date   WBC 5.5 07/21/2013   HGB 14.5 08/08/2014   HCT 40.4 07/21/2013   MCV 92.9 07/21/2013   PLT 280 07/26/2012   ASSESSMENT & PLAN:  Breast cancer of upper-outer quadrant of left female breast Left breast lumpectomy 08/08/2014: 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 Recommendation: 1. Adjuvant chemotherapy with dose dense Adriamycin and Cytoxan x 4 followed by Abraxane weekly 12 because of lymph node positive disease. 2. Followed by adjuvant radiation 3. Followed by antiestrogen therapy  Plan: 1. Port placement was done during the repeat surgery 2. Echocardiogram extent thousand 16 showed ejection fraction of 62% 3.  Chemotherapy class has been completed 4. Plan to start chemotherapy next Monday, 09/04/2014  No orders of the defined types were placed in this encounter.   The patient has a good understanding of the overall plan. she agrees with it. she will call with any problems that may develop before the next visit here.   ,  K, MD      

## 2014-08-30 ENCOUNTER — Telehealth: Payer: Self-pay | Admitting: Hematology and Oncology

## 2014-08-30 NOTE — Telephone Encounter (Signed)
Patient aware of her appointments

## 2014-09-03 NOTE — Assessment & Plan Note (Signed)
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 Adjuvant chemotherapy with dose dense Adriamycin and Cytoxan x 4 followed by Abraxane weekly 12 because of lymph node positive disease. 2. Followed by adjuvant radiation 3. Followed by antiestrogen therapy  Current Treatment: cycle 1 day 1 dose dense Adriamycin and Cytoxan  Monitor closely for toxicities Counts are adequate for treatment RTC in 1 week for tox check

## 2014-09-04 ENCOUNTER — Ambulatory Visit (HOSPITAL_BASED_OUTPATIENT_CLINIC_OR_DEPARTMENT_OTHER): Payer: PRIVATE HEALTH INSURANCE | Admitting: Hematology and Oncology

## 2014-09-04 ENCOUNTER — Ambulatory Visit (HOSPITAL_BASED_OUTPATIENT_CLINIC_OR_DEPARTMENT_OTHER): Payer: PRIVATE HEALTH INSURANCE

## 2014-09-04 ENCOUNTER — Ambulatory Visit: Payer: PRIVATE HEALTH INSURANCE | Admitting: Hematology and Oncology

## 2014-09-04 ENCOUNTER — Other Ambulatory Visit: Payer: PRIVATE HEALTH INSURANCE

## 2014-09-04 ENCOUNTER — Other Ambulatory Visit (HOSPITAL_BASED_OUTPATIENT_CLINIC_OR_DEPARTMENT_OTHER): Payer: PRIVATE HEALTH INSURANCE

## 2014-09-04 ENCOUNTER — Telehealth: Payer: Self-pay | Admitting: Hematology and Oncology

## 2014-09-04 ENCOUNTER — Encounter: Payer: Self-pay | Admitting: Hematology and Oncology

## 2014-09-04 VITALS — BP 114/68 | HR 78 | Temp 98.2°F | Resp 18 | Ht 65.0 in | Wt 144.6 lb

## 2014-09-04 DIAGNOSIS — Z5111 Encounter for antineoplastic chemotherapy: Secondary | ICD-10-CM

## 2014-09-04 DIAGNOSIS — Z5189 Encounter for other specified aftercare: Secondary | ICD-10-CM | POA: Diagnosis not present

## 2014-09-04 DIAGNOSIS — C50412 Malignant neoplasm of upper-outer quadrant of left female breast: Secondary | ICD-10-CM | POA: Diagnosis not present

## 2014-09-04 DIAGNOSIS — Z170 Estrogen receptor positive status [ER+]: Secondary | ICD-10-CM

## 2014-09-04 DIAGNOSIS — C773 Secondary and unspecified malignant neoplasm of axilla and upper limb lymph nodes: Secondary | ICD-10-CM

## 2014-09-04 LAB — COMPREHENSIVE METABOLIC PANEL (CC13)
ALT: 20 U/L (ref 0–55)
ANION GAP: 7 meq/L (ref 3–11)
AST: 20 U/L (ref 5–34)
Albumin: 3.4 g/dL — ABNORMAL LOW (ref 3.5–5.0)
Alkaline Phosphatase: 62 U/L (ref 40–150)
BUN: 13.2 mg/dL (ref 7.0–26.0)
CO2: 28 meq/L (ref 22–29)
Calcium: 8.5 mg/dL (ref 8.4–10.4)
Chloride: 104 mEq/L (ref 98–109)
Creatinine: 0.6 mg/dL (ref 0.6–1.1)
EGFR: >90 mL/min/{1.73_m2} (ref >90)
GLUCOSE: 89 mg/dL (ref 70–140)
Potassium: 3.9 mEq/L (ref 3.5–5.1)
SODIUM: 139 meq/L (ref 136–145)
TOTAL PROTEIN: 6.3 g/dL — AB (ref 6.4–8.3)
Total Bilirubin: 0.54 mg/dL (ref 0.20–1.20)

## 2014-09-04 LAB — CBC WITH DIFFERENTIAL/PLATELET
BASO%: 0.4 % (ref 0.0–2.0)
BASOS ABS: 0 10*3/uL (ref 0.0–0.1)
EOS%: 2.5 % (ref 0.0–7.0)
Eosinophils Absolute: 0.1 10*3/uL (ref 0.0–0.5)
HCT: 37.9 % (ref 34.8–46.6)
HGB: 12.6 g/dL (ref 11.6–15.9)
LYMPH%: 18.4 % (ref 14.0–49.7)
MCH: 31 pg (ref 25.1–34.0)
MCHC: 33.2 g/dL (ref 31.5–36.0)
MCV: 93.1 fL (ref 79.5–101.0)
MONO#: 0.8 10*3/uL (ref 0.1–0.9)
MONO%: 14.7 % — AB (ref 0.0–14.0)
NEUT#: 3.3 10*3/uL (ref 1.5–6.5)
NEUT%: 64 % (ref 38.4–76.8)
Platelets: 217 10*3/uL (ref 145–400)
RBC: 4.07 10*6/uL (ref 3.70–5.45)
RDW: 13.4 % (ref 11.2–14.5)
WBC: 5.2 10*3/uL (ref 3.9–10.3)
lymph#: 1 10*3/uL (ref 0.9–3.3)

## 2014-09-04 MED ORDER — PALONOSETRON HCL INJECTION 0.25 MG/5ML
INTRAVENOUS | Status: AC
  Filled 2014-09-04: qty 5

## 2014-09-04 MED ORDER — PALONOSETRON HCL INJECTION 0.25 MG/5ML
0.2500 mg | Freq: Once | INTRAVENOUS | Status: AC
  Administered 2014-09-04: 0.25 mg via INTRAVENOUS

## 2014-09-04 MED ORDER — SODIUM CHLORIDE 0.9 % IJ SOLN
10.0000 mL | INTRAMUSCULAR | Status: DC | PRN
  Administered 2014-09-04: 10 mL
  Filled 2014-09-04: qty 10

## 2014-09-04 MED ORDER — SODIUM CHLORIDE 0.9 % IV SOLN
580.0000 mg/m2 | Freq: Once | INTRAVENOUS | Status: AC
  Administered 2014-09-04: 1000 mg via INTRAVENOUS
  Filled 2014-09-04: qty 50

## 2014-09-04 MED ORDER — SODIUM CHLORIDE 0.9 % IV SOLN
Freq: Once | INTRAVENOUS | Status: AC
  Administered 2014-09-04: 11:00:00 via INTRAVENOUS

## 2014-09-04 MED ORDER — SODIUM CHLORIDE 0.9 % IV SOLN
Freq: Once | INTRAVENOUS | Status: AC
  Administered 2014-09-04: 12:00:00 via INTRAVENOUS
  Filled 2014-09-04: qty 5

## 2014-09-04 MED ORDER — HEPARIN SOD (PORK) LOCK FLUSH 100 UNIT/ML IV SOLN
500.0000 [IU] | Freq: Once | INTRAVENOUS | Status: AC | PRN
  Administered 2014-09-04: 500 [IU]
  Filled 2014-09-04: qty 5

## 2014-09-04 MED ORDER — PEGFILGRASTIM 6 MG/0.6ML ~~LOC~~ PSKT
6.0000 mg | PREFILLED_SYRINGE | Freq: Once | SUBCUTANEOUS | Status: AC
  Administered 2014-09-04: 6 mg via SUBCUTANEOUS
  Filled 2014-09-04: qty 0.6

## 2014-09-04 MED ORDER — DOXORUBICIN HCL CHEMO IV INJECTION 2 MG/ML
58.0000 mg/m2 | Freq: Once | INTRAVENOUS | Status: AC
  Administered 2014-09-04: 100 mg via INTRAVENOUS
  Filled 2014-09-04: qty 50

## 2014-09-04 NOTE — Telephone Encounter (Signed)
Gave asv & calendar for July.

## 2014-09-04 NOTE — Progress Notes (Signed)
Patient Care Team: Chipper Herb, MD as PCP - General (Family Medicine)  DIAGNOSIS: No matching staging information was found for the patient.  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, with LCIS, medial and inferior margins positive, 3/4 lymph nodes positive T2 N1 cM0 stage IIB   08/16/2014 Surgery Left breast medial margin reexcision residual ILC 0.2 cm; inferior margin residual foci less than 0.2 cm, 1/5 lymph nodes positive, 1 lymph node with isolated tumor cells (Overall 4/10)   09/04/2014 -  Chemotherapy Adjuvant chemotherapy with dose dense Adriamycin and Cytoxan 4 followed by Abraxane weekly 12    CHIEF COMPLIANT: Cycle 1 day 1 dose dense Adriamycin and Cytoxan  INTERVAL HISTORY: [name] is a 54 year old with above-mentioned history of left breast cancer status post lumpectomy and she is starting adjuvant chemotherapy with dose dense Adriamycin Cytoxan today cycle 1 day 1. She received her antiemetics and understands completely how to take them. She is here today to sign consent form and get started with the first treatment  REVIEW OF SYSTEMS:   Constitutional: Denies fevers, chills or abnormal weight loss Eyes: Denies blurriness of vision Ears, nose, mouth, throat, and face: Denies mucositis or sore throat Respiratory: Denies cough, dyspnea or wheezes Cardiovascular: Denies palpitation, chest discomfort or lower extremity swelling Gastrointestinal:   Denies nausea, heartburn or change in bowel habits Skin: Denies abnormal skin rashes Lymphatics: Denies new lymphadenopathy or easy bruising Neurological:Denies numbness, tingling or new weaknesses Behavioral/Psych: Mood is stable, no new changes  Breast:  denies any pain or lumps or nodules in either breasts All other systems were reviewed with the patient and are negative.  I have reviewed the past medical history, past surgical history, social history and family history with the patient and they are unchanged from previous note.  ALLERGIES:  is allergic to crestor; lipitor; and temazepam.  MEDICATIONS:  Current Outpatient Prescriptions  Medication Sig Dispense Refill  . acetaminophen (TYLENOL) 500 MG tablet Take 500 mg by mouth every 6 (six) hours as needed for pain.    . cephALEXin (KEFLEX) 500 MG capsule TAKE ONE CAPSULE BY MOUTH 4 TIMES A DAY  0  . clonazePAM (KLONOPIN) 0.5 MG tablet TAKE 1 TABLET THREE TIMES A DAY 90 tablet 2  . dexamethasone (DECADRON) 4 MG tablet Take 1 tablets by mouth once a day on the day after chemotherapy and then take 1 tablets two times a day for 2 days. Take with food. 30 tablet 1  . HYDROcodone-acetaminophen (NORCO/VICODIN) 5-325 MG per tablet TAKE 1 TO 2 TABLETS EVERY 4 HOURS AS NEEDED  0  . lidocaine-prilocaine (EMLA) cream Apply to affected area once 30 g 3  . LORazepam (ATIVAN) 0.5 MG tablet Take 1 tablet (0.5 mg total) by mouth at bedtime. 30 tablet 0  . ondansetron (ZOFRAN) 8 MG tablet Take 1 tablet (8 mg total) by mouth 2 (two) times daily as needed. Start on the third day after chemotherapy.  30 tablet 1  . PARoxetine (PAXIL-CR) 25 MG 24 hr tablet TAKE 1 TABLET BY MOUTH EVERY DAY 90 tablet 2  . prochlorperazine (COMPAZINE) 10 MG tablet Take 1 tablet (10 mg total) by mouth every 6 (six) hours as needed (Nausea or vomiting). 30 tablet 1   No current facility-administered medications for this visit.    PHYSICAL EXAMINATION: ECOG PERFORMANCE STATUS:  0 - Asymptomatic  Filed Vitals:   09/04/14 1011  BP: 114/68  Pulse: 78  Temp: 98.2 F (36.8 C)  Resp: 18   Filed Weights   09/04/14 1011  Weight: 144 lb 9.6 oz (65.59 kg)    GENERAL:alert, no distress and comfortable SKIN: skin color, texture, turgor are normal, no rashes or significant lesions EYES: normal, Conjunctiva are pink and non-injected, sclera clear OROPHARYNX:no exudate, no erythema and lips, buccal mucosa, and tongue normal  NECK: supple, thyroid normal size, non-tender, without nodularity LYMPH:  no palpable lymphadenopathy in the cervical, axillary or inguinal LUNGS: clear to auscultation and percussion with normal breathing effort HEART: regular rate & rhythm and no murmurs and no lower extremity edema ABDOMEN:abdomen soft, non-tender and normal bowel sounds Musculoskeletal:no cyanosis of digits and no clubbing  NEURO: alert & oriented x 3 with fluent speech, no focal motor/sensory deficits  LABORATORY DATA:  I have reviewed the data as listed   Chemistry      Component Value Date/Time   NA 139 09/04/2014 0956   NA 141 07/21/2013 1023   NA 139 10/04/2012 1123   K 3.9 09/04/2014 0956   K 4.0 07/21/2013 1023   CL 100 07/21/2013 1023   CO2 28 09/04/2014 0956   CO2 26 07/21/2013 1023   BUN 13.2 09/04/2014 0956   BUN 14 07/21/2013 1023   BUN 10 10/04/2012 1123   CREATININE 0.6 09/04/2014 0956   CREATININE 0.45* 07/21/2013 1023   CREATININE 0.54 10/04/2012 1123      Component Value Date/Time   CALCIUM 8.5 09/04/2014 0956   CALCIUM 8.8 07/21/2013 1023   ALKPHOS 62 09/04/2014 0956   ALKPHOS 53 07/21/2013 1023   AST 20 09/04/2014 0956   AST 15 07/21/2013 1023   ALT 20 09/04/2014 0956   ALT 14 07/21/2013 1023   BILITOT 0.54 09/04/2014 0956   BILITOT 0.5 07/21/2013 1023       Lab Results  Component Value Date   WBC 5.2 09/04/2014   HGB 12.6 09/04/2014   HCT 37.9 09/04/2014   MCV 93.1 09/04/2014   PLT 217 09/04/2014   NEUTROABS 3.3 09/04/2014    ASSESSMENT & PLAN:  Breast cancer of upper-outer quadrant of left female breast Left breast lumpectomy 08/08/2014: 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 Adjuvant chemotherapy with dose dense Adriamycin and Cytoxan x 4 followed by Abraxane weekly 12 because of lymph node positive disease. 2. Followed by adjuvant radiation 3. Followed by antiestrogen therapy  Current Treatment: Today is cycle 1 day 1 dose dense Adriamycin and Cytoxan  Antiemetics were once again reviewed We will monitor closely for toxicities Counts are adequate for treatment RTC in 1 week for tox check  No orders of the defined types were  placed in this encounter.   The patient has a good understanding of the overall plan. she agrees with it. she will call with any problems that may develop before the next visit here.   Rulon Eisenmenger, MD

## 2014-09-04 NOTE — Patient Instructions (Signed)
[facility] Discharge Instructions for Patients Receiving Chemotherapy  Today you received the following chemotherapy agents Adriamycin and Cytoxan  To help prevent nausea and vomiting after your treatment, we encourage you to take your nausea medication Compazine 10 mg every 6 hours as needed   If you develop nausea and vomiting that is not controlled by your nausea medication, call the clinic.   BELOW ARE SYMPTOMS THAT SHOULD BE REPORTED IMMEDIATELY:  *FEVER GREATER THAN 100.5 F  *CHILLS WITH OR WITHOUT FEVER  NAUSEA AND VOMITING THAT IS NOT CONTROLLED WITH YOUR NAUSEA MEDICATION  *UNUSUAL SHORTNESS OF BREATH  *UNUSUAL BRUISING OR BLEEDING  TENDERNESS IN MOUTH AND THROAT WITH OR WITHOUT PRESENCE OF ULCERS  *URINARY PROBLEMS  *BOWEL PROBLEMS  UNUSUAL RASH Items with * indicate a potential emergency and should be followed up as soon as possible.  Feel free to call the clinic you have any questions or concerns. The clinic phone number is [phone].  Please show the [facility] at check-in to the Emergency Department and triage nurse.

## 2014-09-05 ENCOUNTER — Telehealth: Payer: Self-pay

## 2014-09-05 NOTE — Telephone Encounter (Signed)
-----   Message from Domenic Schwab, RN sent at 09/04/2014  3:05 PM EDT ----- Regarding: [place] chemo f/u call 1st a/c

## 2014-09-05 NOTE — Telephone Encounter (Signed)
LMOVM - Following up on 1st time chemo.  Pt to call clinic if she has any questions or concerns.

## 2014-09-09 ENCOUNTER — Inpatient Hospital Stay (HOSPITAL_COMMUNITY)
Admission: EM | Admit: 2014-09-09 | Discharge: 2014-09-11 | DRG: 810 | Disposition: A | Discharge status: Home / Self Care | Payer: PRIVATE HEALTH INSURANCE | Attending: Internal Medicine | Admitting: Internal Medicine

## 2014-09-09 ENCOUNTER — Encounter (HOSPITAL_COMMUNITY): Payer: Self-pay | Admitting: Emergency Medicine

## 2014-09-09 DIAGNOSIS — R5081 Fever presenting with conditions classified elsewhere: Secondary | ICD-10-CM | POA: Diagnosis present

## 2014-09-09 DIAGNOSIS — T451X5A Adverse effect of antineoplastic and immunosuppressive drugs, initial encounter: Secondary | ICD-10-CM | POA: Diagnosis present

## 2014-09-09 DIAGNOSIS — Z170 Estrogen receptor positive status [ER+]: Secondary | ICD-10-CM

## 2014-09-09 DIAGNOSIS — C50412 Malignant neoplasm of upper-outer quadrant of left female breast: Secondary | ICD-10-CM | POA: Diagnosis present

## 2014-09-09 DIAGNOSIS — D709 Neutropenia, unspecified: Secondary | ICD-10-CM | POA: Diagnosis not present

## 2014-09-09 DIAGNOSIS — D61810 Antineoplastic chemotherapy induced pancytopenia: Secondary | ICD-10-CM | POA: Diagnosis present

## 2014-09-09 DIAGNOSIS — E785 Hyperlipidemia, unspecified: Secondary | ICD-10-CM | POA: Diagnosis present

## 2014-09-09 LAB — URINALYSIS, ROUTINE W REFLEX MICROSCOPIC
BILIRUBIN URINE: NEGATIVE
Glucose, UA: NEGATIVE mg/dL
HGB URINE DIPSTICK: NEGATIVE
Ketones, ur: NEGATIVE mg/dL
LEUKOCYTES UA: NEGATIVE
Nitrite: NEGATIVE
PH: 7 (ref 5.0–8.0)
PROTEIN: NEGATIVE mg/dL
SPECIFIC GRAVITY, URINE: 1.016 (ref 1.005–1.030)
Urobilinogen, UA: 1 mg/dL (ref 0.0–1.0)

## 2014-09-09 NOTE — ED Provider Notes (Signed)
CSN: [id number]     Arrival date & time 09/09/14  2251 History   First MD Initiated Contact with Patient 09/09/14 2313     Chief Complaint  Patient presents with  . Fever     (Consider location/radiation/quality/duration/timing/severity/associated sxs/prior Treatment) HPI  This is a 54 year old female status post lumpectomy for left breast cancer. She underwent her first round of chemotherapy 5 days ago. She had been feeling well throughout the day until about 2 hours ago when she had the sudden onset of chills. She took her temperature and found herself to have a fever of 102.8. She took acetaminophen and on arrival her temperature was 99.5. She denies nausea, vomiting, diarrhea, dysuria, abdominal pain, cough or shortness of breath. She has had constipation but had a bowel movement prior to arrival. Her only other complaint is that she develops hiccups when she eats.  Past Medical History  Diagnosis Date  . Cervical stenosis (uterine cervix)   . Abnormal Pap smear of cervix     Dr. Eilleen Kempf   . Hyperlipidemia   . Dizziness   . Insomnia   . Nervousness(799.21)   . Meniere's disease   . Benign hemangioma     Dr,  Marybelle Killings   . Allergy   . Breast cancer 07/20/14    Left Breast  . Anxiety   . Depression    Past Surgical History  Procedure Laterality Date  . Uterine ablation  02/2006     Due to heavy periods Dr. Jon Billings   . Bilateral  tubal ligation  05/08/2004  . Tubal ligation    . Wisdom teeth extractions  yrs ago  . Shoulder open rotator cuff repair Right 07/28/2012    Procedure: RIGHT SHOULDER DECOMPRESSION AND EXCISION OF CALCIFIC DEPOSIT;  Surgeon: Tobi Bastos, MD;  Location: [facility];  Service: Orthopedics;  Laterality: Right;  . Breast lumpectomy with radioactive seed and sentinel lymph node biopsy Left 08/08/2014    Procedure: BREAST LUMPECTOMY WITH RADIOACTIVE SEED AND SENTINEL LYMPH NODE BIOPSY;  Surgeon: Coralie Keens, MD;  Location: [place];   Service: General;  Laterality: Left;  . Re-excision of breast lumpectomy Left 08/16/2014    Procedure: RE-EXCISION OF LEFT BREAST CANCER AND PORT PLACEMENT/POSSIBLE AXILLARY DISECTION;  Surgeon: Coralie Keens, MD;  Location: [place];  Service: General;  Laterality: Left;  . Portacath placement Right 08/16/2014    Procedure: INSERTION PORT-A-CATH;  Surgeon: Coralie Keens, MD;  Location: [place];  Service: General;  Laterality: Right;  . Axillary lymph node dissection Left 08/16/2014    Procedure: AXILLARY LYMPH NODE DISSECTION;  Surgeon: Coralie Keens, MD;  Location: [place];  Service: General;  Laterality: Left;   Family History  Problem Relation Age of Onset  . Cirrhosis Father   . Colon polyps Mother   . Colon cancer Neg Hx    History  Substance Use Topics  . Smoking status: Never Smoker   . Smokeless tobacco: Never Used  . Alcohol Use: Yes     Comment: very rare   OB History    No data available     Review of Systems  All other systems reviewed and are negative.   Allergies  Crestor; Lipitor; and Temazepam  Home Medications   Prior to Admission medications   Medication Sig Start Date End Date Taking? Authorizing Provider  acetaminophen (TYLENOL) 325 MG tablet Take 650 mg by mouth every 6 (six) hours as needed for moderate pain.  Yes Historical Provider, MD  clonazePAM (KLONOPIN) 0.5 MG tablet TAKE 1 TABLET THREE TIMES A DAY 08/28/14  Yes Chipper Herb, MD  dexamethasone (DECADRON) 4 MG tablet Take 1 tablets by mouth once a day on the day after chemotherapy and then take 1 tablets two times a day for 2 days. Take with food. Patient taking differently: Take 4 mg by mouth 2 (two) times daily as needed (nausea). Take 1 tablets by mouth once a day on the day after chemotherapy and then take 1 tablets two times a day for 2 days. Take with food. 08/29/14  Yes Nicholas Lose, MD  hydroxypropyl methylcellulose / hypromellose  (ISOPTO TEARS / GONIOVISC) 2.5 % ophthalmic solution Place 1 drop into both eyes 3 (three) times daily as needed for dry eyes.   Yes Historical Provider, MD  lidocaine-prilocaine (EMLA) cream Apply to affected area once Patient taking differently: Apply 1 application topically daily as needed (port). Apply to affected area once 08/29/14  Yes Nicholas Lose, MD  LORazepam (ATIVAN) 0.5 MG tablet Take 1 tablet (0.5 mg total) by mouth at bedtime. 08/29/14  Yes Nicholas Lose, MD  ondansetron (ZOFRAN) 8 MG tablet Take 1 tablet (8 mg total) by mouth 2 (two) times daily as needed. Start on the third day after chemotherapy. Patient taking differently: Take 8 mg by mouth 2 (two) times daily as needed for nausea. Start on the third day after chemotherapy. 08/29/14  Yes Nicholas Lose, MD  PARoxetine (PAXIL-CR) 25 MG 24 hr tablet TAKE 1 TABLET BY MOUTH EVERY DAY 08/28/14  Yes Chipper Herb, MD  prochlorperazine (COMPAZINE) 10 MG tablet Take 1 tablet (10 mg total) by mouth every 6 (six) hours as needed (Nausea or vomiting). 08/29/14  Yes Nicholas Lose, MD   BP 124/77 mmHg  Pulse 89  Temp(Src) 99.5 F (37.5 C) (Oral)  Resp 18  Ht '5\' 5"'$  (1.651 m)  Wt 145 lb (65.772 kg)  BMI 24.13 kg/m2  SpO2 98%   Physical Exam  General: Well-developed, well-nourished female in no acute distress; appearance consistent with age of record HENT: normocephalic; atraumatic Eyes: pupils equal, round and reactive to light; extraocular muscles intact Neck: supple Heart: regular rate and rhythm Lungs: clear to auscultation bilaterally Chest: Port-A-Cath right upper chest Abdomen: soft; nondistended; mild suprapubic tenderness; no masses or hepatosplenomegaly; bowel sounds present Extremities: No deformity; full range of motion; pulses normal Neurologic: Awake, alert and oriented; motor function intact in all extremities and symmetric; no facial droop Skin: Warm and dry Psychiatric: Normal mood and affect    ED Course  Procedures  (including critical care time)   MDM  Nursing notes and vitals signs, including pulse oximetry, reviewed.  Summary of this visit's results, reviewed by myself:  Labs:  Results for orders placed or performed during the hospital encounter of 09/09/14 (from the past 24 hour(s))  Urinalysis, Routine w reflex microscopic (not at [facility])     Status: None   Collection Time: 09/09/14 11:28 PM  Result Value Ref Range   Color, Urine YELLOW YELLOW   APPearance CLEAR CLEAR   Specific Gravity, Urine 1.016 1.005 - 1.030   pH 7.0 5.0 - 8.0   Glucose, UA NEGATIVE NEGATIVE mg/dL   Hgb urine dipstick NEGATIVE NEGATIVE   Bilirubin Urine NEGATIVE NEGATIVE   Ketones, ur NEGATIVE NEGATIVE mg/dL   Protein, ur NEGATIVE NEGATIVE mg/dL   Urobilinogen, UA 1.0 0.0 - 1.0 mg/dL   Nitrite NEGATIVE NEGATIVE   Leukocytes, UA NEGATIVE NEGATIVE  CBC  with Differential/Platelet     Status: Abnormal   Collection Time: 09/09/14 11:36 PM  Result Value Ref Range   WBC 0.6 (LL) 4.0 - 10.5 K/uL   RBC 3.64 (L) 3.87 - 5.11 MIL/uL   Hemoglobin 10.9 (L) 12.0 - 15.0 g/dL   HCT 34.3 (L) 36.0 - 46.0 %   MCV 94.2 78.0 - 100.0 fL   MCH 29.9 26.0 - 34.0 pg   MCHC 31.8 30.0 - 36.0 g/dL   RDW 13.4 11.5 - 15.5 %   Platelets 126 (L) 150 - 400 K/uL   Neutrophils Relative % 35 (L) 43 - 77 %   Lymphocytes Relative 54 (H) 12 - 46 %   Monocytes Relative 0 (L) 3 - 12 %   Eosinophils Relative 9 (H) 0 - 5 %   Basophils Relative 2 (H) 0 - 1 %   Neutro Abs 0.2 (L) 1.7 - 7.7 K/uL   Lymphs Abs 0.3 (L) 0.7 - 4.0 K/uL   Monocytes Absolute 0.0 (L) 0.1 - 1.0 K/uL   Eosinophils Absolute 0.1 0.0 - 0.7 K/uL   Basophils Absolute 0.0 0.0 - 0.1 K/uL   WBC Morphology WHITE COUNT CONFIRMED ON SMEAR    Smear Review LARGE PLATELETS PRESENT   Comprehensive metabolic panel     Status: Abnormal   Collection Time: 09/09/14 11:36 PM  Result Value Ref Range   Sodium 132 (L) 135 - 145 mmol/L   Potassium 3.6 3.5 - 5.1 mmol/L   Chloride 99 (L) 101 - 111  mmol/L   CO2 25 22 - 32 mmol/L   Glucose, Bld 96 65 - 99 mg/dL   BUN 15 6 - 20 mg/dL   Creatinine, Ser 0.43 (L) 0.44 - 1.00 mg/dL   Calcium 8.2 (L) 8.9 - 10.3 mg/dL   Total Protein 6.2 (L) 6.5 - 8.1 g/dL   Albumin 3.5 3.5 - 5.0 g/dL   AST 19 15 - 41 U/L   ALT 26 14 - 54 U/L   Alkaline Phosphatase 70 38 - 126 U/L   Total Bilirubin 0.8 0.3 - 1.2 mg/dL   GFR calc non Af Amer >60 >60 mL/min   GFR calc Af Amer >60 >60 mL/min   Anion gap 8 5 - 15   Zosyn started for neutropenic fever. We'll have patient admitted to the hospitalist service.  Shanon Rosser, MD 09/10/14 [phone]

## 2014-09-09 NOTE — ED Notes (Signed)
Pt reports sudden Tmax of102.8 at home. Had first chemotherapy treatment Monday. Took Tylenol around 2145 (650 mg). Reports "slight constipation" at this time. Denies N/V/D. Says her PO intake has been normal. Denies dysuria. Denies cough. Says the only change is that she hiccups when she eats. Denies any other c/c.

## 2014-09-10 DIAGNOSIS — C50412 Malignant neoplasm of upper-outer quadrant of left female breast: Secondary | ICD-10-CM | POA: Diagnosis not present

## 2014-09-10 DIAGNOSIS — D709 Neutropenia, unspecified: Secondary | ICD-10-CM | POA: Diagnosis present

## 2014-09-10 DIAGNOSIS — D61810 Antineoplastic chemotherapy induced pancytopenia: Secondary | ICD-10-CM | POA: Diagnosis present

## 2014-09-10 DIAGNOSIS — R5081 Fever presenting with conditions classified elsewhere: Secondary | ICD-10-CM | POA: Diagnosis present

## 2014-09-10 DIAGNOSIS — E785 Hyperlipidemia, unspecified: Secondary | ICD-10-CM | POA: Diagnosis present

## 2014-09-10 DIAGNOSIS — T451X5A Adverse effect of antineoplastic and immunosuppressive drugs, initial encounter: Secondary | ICD-10-CM | POA: Diagnosis present

## 2014-09-10 LAB — CBC WITH DIFFERENTIAL/PLATELET
BASOS ABS: 0 10*3/uL (ref 0.0–0.1)
BASOS PCT: 2 % — AB (ref 0–1)
EOS PCT: 9 % — AB (ref 0–5)
Eosinophils Absolute: 0.1 10*3/uL (ref 0.0–0.7)
HEMATOCRIT: 34.3 % — AB (ref 36.0–46.0)
HEMOGLOBIN: 10.9 g/dL — AB (ref 12.0–15.0)
LYMPHS PCT: 54 % — AB (ref 12–46)
Lymphs Abs: 0.3 10*3/uL — ABNORMAL LOW (ref 0.7–4.0)
MCH: 29.9 pg (ref 26.0–34.0)
MCHC: 31.8 g/dL (ref 30.0–36.0)
MCV: 94.2 fL (ref 78.0–100.0)
MONO ABS: 0 10*3/uL — AB (ref 0.1–1.0)
MONOS PCT: 0 % — AB (ref 3–12)
Neutro Abs: 0.2 10*3/uL — ABNORMAL LOW (ref 1.7–7.7)
Neutrophils Relative %: 35 % — ABNORMAL LOW (ref 43–77)
PLATELETS: 126 10*3/uL — AB (ref 150–400)
RBC: 3.64 MIL/uL — ABNORMAL LOW (ref 3.87–5.11)
RDW: 13.4 % (ref 11.5–15.5)
WBC: 0.6 10*3/uL — AB (ref 4.0–10.5)

## 2014-09-10 LAB — COMPREHENSIVE METABOLIC PANEL
ALBUMIN: 3.5 g/dL (ref 3.5–5.0)
ALT: 26 U/L (ref 14–54)
AST: 19 U/L (ref 15–41)
Alkaline Phosphatase: 70 U/L (ref 38–126)
Anion gap: 8 (ref 5–15)
BILIRUBIN TOTAL: 0.8 mg/dL (ref 0.3–1.2)
BUN: 15 mg/dL (ref 6–20)
CO2: 25 mmol/L (ref 22–32)
Calcium: 8.2 mg/dL — ABNORMAL LOW (ref 8.9–10.3)
Chloride: 99 mmol/L — ABNORMAL LOW (ref 101–111)
Creatinine, Ser: 0.43 mg/dL — ABNORMAL LOW (ref 0.44–1.00)
GFR calc Af Amer: >60 mL/min (ref >60)
GLUCOSE: 96 mg/dL (ref 65–99)
Potassium: 3.6 mmol/L (ref 3.5–5.1)
Sodium: 132 mmol/L — ABNORMAL LOW (ref 135–145)
TOTAL PROTEIN: 6.2 g/dL — AB (ref 6.5–8.1)

## 2014-09-10 MED ORDER — ACETAMINOPHEN 650 MG RE SUPP: 650.0000 mg | Freq: Four times a day (QID) | RECTAL | Status: DC | PRN

## 2014-09-10 MED ORDER — VANCOMYCIN HCL IN DEXTROSE 1-5 GM/200ML-% IV SOLN
1000.0000 mg | INTRAVENOUS | Status: AC
  Administered 2014-09-10: 1000 mg via INTRAVENOUS
  Filled 2014-09-10: qty 200

## 2014-09-10 MED ORDER — ALUM & MAG HYDROXIDE-SIMETH 200-200-20 MG/5ML PO SUSP
30.0000 mL | ORAL | Status: DC | PRN
  Administered 2014-09-10: 30 mL via ORAL
  Filled 2014-09-10: qty 30

## 2014-09-10 MED ORDER — ENOXAPARIN SODIUM 40 MG/0.4ML ~~LOC~~ SOLN
40.0000 mg | SUBCUTANEOUS | Status: DC
  Administered 2014-09-10: 40 mg via SUBCUTANEOUS
  Filled 2014-09-10 (×2): qty 0.4

## 2014-09-10 MED ORDER — ACETAMINOPHEN 325 MG PO TABS
650.0000 mg | ORAL_TABLET | Freq: Four times a day (QID) | ORAL | Status: DC | PRN
  Administered 2014-09-10 – 2014-09-11 (×3): 650 mg via ORAL
  Filled 2014-09-10 (×3): qty 2

## 2014-09-10 MED ORDER — SODIUM CHLORIDE 0.9 % IV SOLN
INTRAVENOUS | Status: AC
  Administered 2014-09-10: 03:00:00 via INTRAVENOUS

## 2014-09-10 MED ORDER — CLONAZEPAM 1 MG PO TABS
1.0000 mg | ORAL_TABLET | Freq: Three times a day (TID) | ORAL | Status: DC
  Administered 2014-09-10 – 2014-09-11 (×4): 1 mg via ORAL
  Filled 2014-09-10 (×4): qty 1

## 2014-09-10 MED ORDER — FILGRASTIM 480 MCG/1.6ML IJ SOLN
480.0000 ug | Freq: Every day | INTRAMUSCULAR | Status: DC
  Administered 2014-09-10: 480 ug via SUBCUTANEOUS
  Filled 2014-09-10 (×3): qty 1.6

## 2014-09-10 MED ORDER — PAROXETINE HCL ER 25 MG PO TB24
25.0000 mg | ORAL_TABLET | Freq: Every day | ORAL | Status: DC
  Administered 2014-09-10: 25 mg via ORAL
  Filled 2014-09-10 (×2): qty 1

## 2014-09-10 MED ORDER — PIPERACILLIN-TAZOBACTAM 3.375 G IVPB
3.3750 g | Freq: Three times a day (TID) | INTRAVENOUS | Status: DC
  Administered 2014-09-10 – 2014-09-11 (×3): 3.375 g via INTRAVENOUS
  Filled 2014-09-10 (×5): qty 50

## 2014-09-10 MED ORDER — VANCOMYCIN HCL IN DEXTROSE 750-5 MG/150ML-% IV SOLN
750.0000 mg | Freq: Three times a day (TID) | INTRAVENOUS | Status: DC
  Administered 2014-09-10 – 2014-09-11 (×3): 750 mg via INTRAVENOUS
  Filled 2014-09-10 (×4): qty 150

## 2014-09-10 MED ORDER — HEPARIN SODIUM (PORCINE) 5000 UNIT/ML IJ SOLN
5000.0000 [IU] | Freq: Three times a day (TID) | INTRAMUSCULAR | Status: DC
  Administered 2014-09-10: 5000 [IU] via SUBCUTANEOUS
  Filled 2014-09-10 (×4): qty 1

## 2014-09-10 MED ORDER — PIPERACILLIN-TAZOBACTAM 4.5 G IVPB: 4.5000 g | Freq: Once | INTRAVENOUS | Status: DC

## 2014-09-10 MED ORDER — PIPERACILLIN-TAZOBACTAM 3.375 G IVPB 30 MIN
3.3750 g | INTRAVENOUS | Status: AC
  Administered 2014-09-10: 3.375 g via INTRAVENOUS
  Filled 2014-09-10: qty 50

## 2014-09-10 NOTE — Progress Notes (Signed)
Pt admitted to 1331 accompanied by husband. Reviewed unit routines and plan of care. No complaints of pain at this time. VS stable and afebrile. Continue to monitor. Hortencia Conradi RN

## 2014-09-10 NOTE — Progress Notes (Signed)
Progress Note   [name] OVF:[id number] DOB: May 22, 1960 DOA: 09/09/2014 PCP: Redge Gainer, MD   Brief Narrative:   [name] is an 54 y.o. female with a PMH of left-sided breast cancer status post lumpectomy and medial margin reexcision for residual disease as well as adjuvant chemotherapy with Adriamycin and Cytoxan 4 followed by Abraxane weekly 12, and now receiving dose dense chemotherapy with Adriamycin and Cytoxan followed by Neulasta support with first treatment on 09/04/14, who was admitted 09/09/14 with neutropenic fever.  Assessment/Plan:   Principal Problem:    Neutropenic fever / febrile neutropenia - Patient was started on empiric Zosyn. Would add vancomycin as she has a Port-A-Cath. - Follow-up blood cultures. - Urinalysis without signs of infection. Chest x-ray reportedly clear, but cannot find results in Epic.  Active Problems:   Breast cancer of upper-outer quadrant of left female breast - Status post Adriamycin and Cytoxan with Neulasta given 09/04/14. Burnis Medin notify her oncologist of her admission.    DVT Prophylaxis - Lovenox ordered.  Family Communication: No family at bedside. Disposition Plan: Home tomorrow if afebrile and stable. Code Status:     Code Status Orders        Start     Ordered   09/10/14 0409  Full code   Continuous     09/10/14 0413        IV Access:    Peripheral IV  Port-A-Cath in place, but not accessed.   Procedures and diagnostic studies:   No results found.   Medical Consultants:    None.  Anti-Infectives:    Zosyn 09/10/14--->  Vancomycin 09/10/14--->  Subjective:    Sima Matas had some chills last night, but no significant fever. No dyspnea or cough. No nausea or vomiting. Feels well. Slightly anxious over being in the hospital.  Objective:    Filed Vitals:   09/09/14 2259 09/10/14 0214 09/10/14 0335 09/10/14 0338  BP: 124/77 108/92  112/68  Pulse: 89 75  74  Temp: 99.5 F  (37.5 C) 98.2 F (36.8 C)  98.4 F (36.9 C)  TempSrc: Oral   Oral  Resp: '18 18  18  '$ Height: '5\' 5"'$  (1.651 m)  '5\' 5"'$  (1.651 m)   Weight: 65.772 kg (145 lb)  64.864 kg (143 lb)   SpO2: 98% 97%  100%    Intake/Output Summary (Last 24 hours) at 09/10/14 8416 Last data filed at 09/10/14 0400  Gross per 24 hour  Intake      0 ml  Output    200 ml  Net   -200 ml    Exam: Gen:  NAD Cardiovascular:  RRR, No M/R/G Respiratory:  Lungs CTAB, Port-A-Cath site right upper chest without evidence of infection. Gastrointestinal:  Abdomen soft, NT/ND, + BS Extremities:  No C/E/C   Data Reviewed:    Labs: Basic Metabolic Panel:  Recent Labs Lab 09/04/14 0956 09/09/14 2336  NA 139 132*  K 3.9 3.6  CL  --  99*  CO2 28 25  GLUCOSE 89 96  BUN 13.2 15  CREATININE 0.6 0.43*  CALCIUM 8.5 8.2*   GFR Estimated Creatinine Clearance: 74 mL/min (by C-G formula based on Cr of 0.43). Liver Function Tests:  Recent Labs Lab 09/04/14 0956 09/09/14 2336  AST 20 19  ALT 20 26  ALKPHOS 62 70  BILITOT 0.54 0.8  PROT 6.3* 6.2*  ALBUMIN 3.4* 3.5   CBC:  Recent Labs Lab 09/04/14 0956 09/09/14 2336  WBC 5.2 0.6*  NEUTROABS 3.3 0.2*  HGB 12.6 10.9*  HCT 37.9 34.3*  MCV 93.1 94.2  PLT 217 126*   Urinalysis    Component Value Date/Time   COLORURINE YELLOW 09/09/2014 [place] 09/09/2014 2328   LABSPEC 1.016 09/09/2014 2328   PHURINE 7.0 09/09/2014 2328   GLUCOSEU NEGATIVE 09/09/2014 2328   HGBUR NEGATIVE 09/09/2014 2328   BILIRUBINUR NEGATIVE 09/09/2014 2328   KETONESUR NEGATIVE 09/09/2014 2328   PROTEINUR NEGATIVE 09/09/2014 2328   UROBILINOGEN 1.0 09/09/2014 2328   NITRITE NEGATIVE 09/09/2014 2328   LEUKOCYTESUR NEGATIVE 09/09/2014 2328    Microbiology No results found for this or any previous visit (from the past 240 hour(s)).   Medications:   . sodium chloride   Intravenous STAT  . heparin  5,000 Units Subcutaneous 3 times per day  .  piperacillin-tazobactam (ZOSYN)  IV  3.375 g Intravenous Q8H   Continuous Infusions:   Time spent: 30 minutes.   LOS: 0 days   Babette Stum  [facility] Pager [phone]. If unable to reach me by pager, please call my cell phone at [phone].  *Please refer to [email or web address], password [email or web address] to get updated schedule on who will round on this patient, as hospitalists switch teams weekly. If 7PM-7AM, please contact night-coverage at [email or web address], password [email or web address] for any overnight needs.  09/10/2014, 7:38 AM

## 2014-09-10 NOTE — Progress Notes (Signed)
Utilization Review Completed.Donne Anon T7/05/2014

## 2014-09-10 NOTE — Progress Notes (Signed)
ANTIBIOTIC CONSULT NOTE - INITIAL  Pharmacy Consult for Zosyn Indication: Febrile Neutropenia  Allergies  Allergen Reactions  . Crestor [Rosuvastatin] Other (See Comments)    myalgias  . Lipitor [Atorvastatin] Other (See Comments)    myalgias  . Temazepam Other (See Comments)    Pt does not remember    Patient Measurements: Height: '5\' 5"'$  (165.1 cm) Weight: 145 lb (65.772 kg) IBW/kg (Calculated) : 57  Vital Signs: Temp: 98.2 F (36.8 C) (07/03 0214) Temp Source: Oral (07/02 2259) BP: 108/92 mmHg (07/03 0214) Pulse Rate: 75 (07/03 0214) Intake/Output from previous day:   Intake/Output from this shift:    Labs:  Recent Labs  09/09/14 2336  WBC 0.6*  HGB 10.9*  PLT 126*  CREATININE 0.43*   Estimated Creatinine Clearance: 74 mL/min (by C-G formula based on Cr of 0.43). No results for input(s): VANCOTROUGH, VANCOPEAK, VANCORANDOM, GENTTROUGH, GENTPEAK, GENTRANDOM, TOBRATROUGH, TOBRAPEAK, TOBRARND, AMIKACINPEAK, AMIKACINTROU, AMIKACIN in the last 72 hours.   Microbiology: No results found for this or any previous visit (from the past 720 hour(s)).  Medical History: Past Medical History  Diagnosis Date  . Cervical stenosis (uterine cervix)   . Abnormal Pap smear of cervix     Dr. Eilleen Kempf   . Hyperlipidemia   . Dizziness   . Insomnia   . Nervousness(799.21)   . Meniere's disease   . Benign hemangioma     Dr,  Marybelle Killings   . Allergy   . Breast cancer 07/20/14    Left Breast  . Anxiety   . Depression     Medications:  Scheduled:   Infusions:  . sodium chloride     Assessment:  54 yr female with breast cancer, who underwent first cycle of chemotherapy on 6/27 presents with reported at home Temp of 102.8  Pharmacy consulted to dose Zosyn for febrile neutropenia  Blood cultures ordered  Goal of Therapy:  Eradication of infection  Plan:  Follow up culture results  Zosyn 3.375gm IV q8h (each dose infused over 4 hrs)  Jamile Sivils, Toribio Harbour,  PharmD 09/10/2014,3:10 AM

## 2014-09-10 NOTE — H&P (Signed)
[facility] History and Physical  [name] [id number] DOB: 1960/06/19 DOA: 09/09/2014  Referring physician: EDP PCP: Redge Gainer, MD   Chief Complaint: Fever   HPI: [name] is a 54 y.o. female who just had first dose of chemo on Monday last week for BRCA.  Neulasta given as well.  Patient developed chills and fever at home this evening.  Numerous temperature measurements at home were elevated over 101.  When her fever finally got up to 102.8 (oral measurement, hadnt had anything hot to drink beforehand etc).  Patient came in to the ED.  Her Tm in the ED was 99.5, however she was neutropenic with WBC 0.6k.  Review of Systems: Systems reviewed.  As above, otherwise negative  Past Medical History  Diagnosis Date  . Cervical stenosis (uterine cervix)   . Abnormal Pap smear of cervix     Dr. Eilleen Kempf   . Hyperlipidemia   . Dizziness   . Insomnia   . Nervousness(799.21)   . Meniere's disease   . Benign hemangioma     Dr,  Marybelle Killings   . Allergy   . Breast cancer 07/20/14    Left Breast  . Anxiety   . Depression    Past Surgical History  Procedure Laterality Date  . Uterine ablation  02/2006     Due to heavy periods Dr. Jon Billings   . Bilateral  tubal ligation  05/08/2004  . Tubal ligation    . Wisdom teeth extractions  yrs ago  . Shoulder open rotator cuff repair Right 07/28/2012    Procedure: RIGHT SHOULDER DECOMPRESSION AND EXCISION OF CALCIFIC DEPOSIT;  Surgeon: Tobi Bastos, MD;  Location: [facility];  Service: Orthopedics;  Laterality: Right;  . Breast lumpectomy with radioactive seed and sentinel lymph node biopsy Left 08/08/2014    Procedure: BREAST LUMPECTOMY WITH RADIOACTIVE SEED AND SENTINEL LYMPH NODE BIOPSY;  Surgeon: Coralie Keens, MD;  Location: [place];  Service: General;  Laterality: Left;  . Re-excision of breast lumpectomy Left 08/16/2014    Procedure: RE-EXCISION OF LEFT BREAST CANCER AND PORT PLACEMENT/POSSIBLE  AXILLARY DISECTION;  Surgeon: Coralie Keens, MD;  Location: [place];  Service: General;  Laterality: Left;  . Portacath placement Right 08/16/2014    Procedure: INSERTION PORT-A-CATH;  Surgeon: Coralie Keens, MD;  Location: [place];  Service: General;  Laterality: Right;  . Axillary lymph node dissection Left 08/16/2014    Procedure: AXILLARY LYMPH NODE DISSECTION;  Surgeon: Coralie Keens, MD;  Location: [place];  Service: General;  Laterality: Left;   Social History:  reports that she has never smoked. She has never used smokeless tobacco. She reports that she drinks alcohol. She reports that she does not use illicit drugs.  Allergies  Allergen Reactions  . Crestor [Rosuvastatin] Other (See Comments)    myalgias  . Lipitor [Atorvastatin] Other (See Comments)    myalgias  . Temazepam Other (See Comments)    Pt does not remember    Family History  Problem Relation Age of Onset  . Cirrhosis Father   . Colon polyps Mother   . Colon cancer Neg Hx      Prior to Admission medications   Medication Sig Start Date End Date Taking? Authorizing Provider  acetaminophen (TYLENOL) 325 MG tablet Take 650 mg by mouth every 6 (six) hours as needed for moderate pain.   Yes Historical Provider, MD  clonazePAM (KLONOPIN) 0.5 MG tablet TAKE 1 TABLET THREE TIMES  A DAY 08/28/14  Yes Chipper Herb, MD  dexamethasone (DECADRON) 4 MG tablet Take 1 tablets by mouth once a day on the day after chemotherapy and then take 1 tablets two times a day for 2 days. Take with food. Patient taking differently: Take 4 mg by mouth 2 (two) times daily as needed (nausea). Take 1 tablets by mouth once a day on the day after chemotherapy and then take 1 tablets two times a day for 2 days. Take with food. 08/29/14  Yes Nicholas Lose, MD  hydroxypropyl methylcellulose / hypromellose (ISOPTO TEARS / GONIOVISC) 2.5 % ophthalmic solution Place 1 drop into both eyes 3 (three)  times daily as needed for dry eyes.   Yes Historical Provider, MD  lidocaine-prilocaine (EMLA) cream Apply to affected area once Patient taking differently: Apply 1 application topically daily as needed (port). Apply to affected area once 08/29/14  Yes Nicholas Lose, MD  LORazepam (ATIVAN) 0.5 MG tablet Take 1 tablet (0.5 mg total) by mouth at bedtime. 08/29/14  Yes Nicholas Lose, MD  ondansetron (ZOFRAN) 8 MG tablet Take 1 tablet (8 mg total) by mouth 2 (two) times daily as needed. Start on the third day after chemotherapy. Patient taking differently: Take 8 mg by mouth 2 (two) times daily as needed for nausea. Start on the third day after chemotherapy. 08/29/14  Yes Nicholas Lose, MD  PARoxetine (PAXIL-CR) 25 MG 24 hr tablet TAKE 1 TABLET BY MOUTH EVERY DAY 08/28/14  Yes Chipper Herb, MD  prochlorperazine (COMPAZINE) 10 MG tablet Take 1 tablet (10 mg total) by mouth every 6 (six) hours as needed (Nausea or vomiting). 08/29/14  Yes Nicholas Lose, MD   Physical Exam: Filed Vitals:   09/10/14 0338  BP: 112/68  Pulse: 74  Temp: 98.4 F (36.9 C)  Resp: 18    BP 112/68 mmHg  Pulse 74  Temp(Src) 98.4 F (36.9 C) (Oral)  Resp 18  Ht 5' 5"  (1.651 m)  Wt 64.864 kg (143 lb)  BMI 23.80 kg/m2  SpO2 100%  General Appearance:    Alert, oriented, no distress, appears stated age  Head:    Normocephalic, atraumatic  Eyes:    PERRL, EOMI, sclera non-icteric        Nose:   Nares without drainage or epistaxis. Mucosa, turbinates normal  Throat:   Moist mucous membranes. Oropharynx without erythema or exudate.  Neck:   Supple. No carotid bruits.  No thyromegaly.  No lymphadenopathy.   Back:     No CVA tenderness, no spinal tenderness  Lungs:     Clear to auscultation bilaterally, without wheezes, rhonchi or rales  Chest wall:    No tenderness to palpitation  Heart:    Regular rate and rhythm without murmurs, gallops, rubs  Abdomen:     Soft, non-tender, nondistended, normal bowel sounds, no organomegaly   Genitalia:    deferred  Rectal:    deferred  Extremities:   No clubbing, cyanosis or edema.  Pulses:   2+ and symmetric all extremities  Skin:   Skin color, texture, turgor normal, no rashes or lesions  Lymph nodes:   Cervical, supraclavicular, and axillary nodes normal  Neurologic:   CNII-XII intact. Normal strength, sensation and reflexes      throughout    Labs on Admission:  Basic Metabolic Panel:  Recent Labs Lab 09/04/14 0956 09/09/14 2336  NA 139 132*  K 3.9 3.6  CL  --  99*  CO2 28 25  GLUCOSE 89 96  BUN 13.2 15  CREATININE 0.6 0.43*  CALCIUM 8.5 8.2*   Liver Function Tests:  Recent Labs Lab 09/04/14 0956 09/09/14 2336  AST 20 19  ALT 20 26  ALKPHOS 62 70  BILITOT 0.54 0.8  PROT 6.3* 6.2*  ALBUMIN 3.4* 3.5   No results for input(s): LIPASE, AMYLASE in the last 168 hours. No results for input(s): AMMONIA in the last 168 hours. CBC:  Recent Labs Lab 09/04/14 0956 09/09/14 2336  WBC 5.2 0.6*  NEUTROABS 3.3 0.2*  HGB 12.6 10.9*  HCT 37.9 34.3*  MCV 93.1 94.2  PLT 217 126*   Cardiac Enzymes: No results for input(s): CKTOTAL, CKMB, CKMBINDEX, TROPONINI in the last 168 hours.  BNP (last 3 results) No results for input(s): PROBNP in the last 8760 hours. CBG: No results for input(s): GLUCAP in the last 168 hours.  Radiological Exams on Admission: No results found.  EKG: Independently reviewed.  Assessment/Plan Principal Problem:   Neutropenic fever Active Problems:   Breast cancer of upper-outer quadrant of left female breast   Febrile neutropenia   1. Neutropenic fever - has neutropenia on labs, numerous temperature readings at home were febrile with highest of 102.8.  Although patient admittedly has not had T above 99.5 in ED since she took tylenol for fever at home, I have no reason to doubt her story.  Therefore will treat as Neutropenic fever. 1. Zosyn 2. BCx pending 3. CXR and UA are clear, no obvious source, suspect translocation  of GI bacteria. 4. Consult put into Epic for oncology to see patient    Code Status: Full  Family Communication: Husband at bedside Disposition Plan: Admit to inpatient   Time spent: 70 min  Avaley Coop M. [facility] Pager [phone]  If 7AM-7PM, please contact the day team taking care of the patient [email or web address] Password [email or web address] 09/10/2014, 4:16 AM

## 2014-09-10 NOTE — ED Notes (Addendum)
WBC 0.6 critical result called from lab.

## 2014-09-10 NOTE — Progress Notes (Signed)
ANTIBIOTIC CONSULT NOTE - INITIAL  Pharmacy Consult for Vancomycin Indication: Febrile Neutropenia  Allergies  Allergen Reactions  . Crestor [Rosuvastatin] Other (See Comments)    myalgias  . Lipitor [Atorvastatin] Other (See Comments)    myalgias  . Temazepam Other (See Comments)    Pt does not remember    Patient Measurements: Height: '5\' 5"'$  (165.1 cm) Weight: 143 lb (64.864 kg) IBW/kg (Calculated) : 57  Vital Signs: Temp: 98.4 F (36.9 C) (07/03 0338) Temp Source: Oral (07/03 0338) BP: 112/68 mmHg (07/03 0338) Pulse Rate: 74 (07/03 0338) Intake/Output from previous day: 07/02 0701 - 07/03 0700 In: -  Out: 200 [Urine:200] Intake/Output from this shift:    Labs:  Recent Labs  09/09/14 2336  WBC 0.6*  HGB 10.9*  PLT 126*  CREATININE 0.43*   Estimated Creatinine Clearance: 74 mL/min (by C-G formula based on Cr of 0.43). No results for input(s): VANCOTROUGH, VANCOPEAK, VANCORANDOM, GENTTROUGH, GENTPEAK, GENTRANDOM, TOBRATROUGH, TOBRAPEAK, TOBRARND, AMIKACINPEAK, AMIKACINTROU, AMIKACIN in the last 72 hours.   Microbiology: No results found for this or any previous visit (from the past 720 hour(s)).  Medical History: Past Medical History  Diagnosis Date  . Cervical stenosis (uterine cervix)   . Abnormal Pap smear of cervix     Dr. Eilleen Kempf   . Hyperlipidemia   . Dizziness   . Insomnia   . Nervousness(799.21)   . Meniere's disease   . Benign hemangioma     Dr,  Marybelle Killings   . Allergy   . Breast cancer 07/20/14    Left Breast  . Anxiety   . Depression     Medications:  Scheduled:  . sodium chloride   Intravenous STAT  . enoxaparin (LOVENOX) injection  40 mg Subcutaneous Q24H  . piperacillin-tazobactam (ZOSYN)  IV  3.375 g Intravenous Q8H  . vancomycin  1,000 mg Intravenous STAT  . vancomycin  750 mg Intravenous Q8H   Infusions:    Assessment: 54 yr female with breast cancer, who underwent first cycle of chemotherapy on 6/27 presents with  reported at home Temp of 102.8 Pharmacy initially consulted to dose Zosyn for febrile neutropenia, now adding Vancomycin due to presence of PAC  Blood cultures pending  ANC 0.2, s/p Neulasta 6/27  SCr wnl, CrCl 93 ml/min/1.75m (normalized)  Tm 99.5, Tc 98.4  Goal of Therapy:  Eradication of infection Vancomycin troughs 15-20 mcg/ml  Plan:   Vancomycin 1g IV x 1, then '750mg'$  IV q8h Check trough at steady state Follow up renal function, clinical course De-escalate pending culture date  EPeggyann Juba PharmD, BCPS Pager: [phone]/05/2014,7:57 AM

## 2014-09-11 LAB — CBC WITH DIFFERENTIAL/PLATELET
Basophils Absolute: 0 10*3/uL (ref 0.0–0.1)
Basophils Relative: 5 % — ABNORMAL HIGH (ref 0–1)
EOS ABS: 0.1 10*3/uL (ref 0.0–0.7)
Eosinophils Relative: 15 % — ABNORMAL HIGH (ref 0–5)
HCT: 32.6 % — ABNORMAL LOW (ref 36.0–46.0)
Hemoglobin: 10.5 g/dL — ABNORMAL LOW (ref 12.0–15.0)
LYMPHS ABS: 0.3 10*3/uL — AB (ref 0.7–4.0)
Lymphocytes Relative: 72 % — ABNORMAL HIGH (ref 12–46)
MCH: 30.9 pg (ref 26.0–34.0)
MCHC: 32.2 g/dL (ref 30.0–36.0)
MCV: 95.9 fL (ref 78.0–100.0)
MONOS PCT: 5 % (ref 3–12)
Monocytes Absolute: 0 10*3/uL — ABNORMAL LOW (ref 0.1–1.0)
Neutro Abs: 0 10*3/uL — ABNORMAL LOW (ref 1.7–7.7)
Neutrophils Relative %: 3 % — ABNORMAL LOW (ref 43–77)
Platelets: 97 10*3/uL — ABNORMAL LOW (ref 150–400)
RBC: 3.4 MIL/uL — AB (ref 3.87–5.11)
RDW: 13.5 % (ref 11.5–15.5)
WBC: 0.4 10*3/uL — AB (ref 4.0–10.5)

## 2014-09-11 MED ORDER — CEFUROXIME SODIUM 1.5 G IJ SOLR
1.5000 g | Freq: Three times a day (TID) | INTRAMUSCULAR | Status: DC
Start: 2014-09-11 — End: 2014-09-11
  Administered 2014-09-11: 1.5 g via INTRAMUSCULAR
  Filled 2014-09-11 (×4): qty 1.5

## 2014-09-11 MED ORDER — LIDOCAINE-PRILOCAINE 2.5-2.5 % EX CREA: TOPICAL_CREAM | CUTANEOUS | Status: DC

## 2014-09-11 MED ORDER — DEXTROSE 5 % IV SOLN
1.5000 g | Freq: Three times a day (TID) | INTRAVENOUS | Status: DC
  Filled 2014-09-11: qty 1.5

## 2014-09-11 MED ORDER — AMOXICILLIN-POT CLAVULANATE 875-125 MG PO TABS: 1.0000 | ORAL_TABLET | Freq: Two times a day (BID) | ORAL | Status: DC

## 2014-09-11 MED ORDER — LEVOFLOXACIN 750 MG PO TABS: 750.0000 mg | ORAL_TABLET | Freq: Every day | ORAL | Status: DC

## 2014-09-11 MED ORDER — STERILE WATER FOR INJECTION IJ SOLN
INTRAMUSCULAR | Status: AC
  Filled 2014-09-11: qty 10

## 2014-09-11 MED ORDER — ONDANSETRON HCL 8 MG PO TABS: 8.0000 mg | ORAL_TABLET | Freq: Two times a day (BID) | ORAL | Status: DC | PRN

## 2014-09-11 NOTE — Progress Notes (Signed)
Pt discharged home with spouse in stable condition. Discharge instructions given. Scripts sent to pharmacy of choice. Pt and spouse verbalized understanding.

## 2014-09-11 NOTE — Progress Notes (Signed)
ANTIBIOTIC CONSULT NOTE - Follow-Up  Pharmacy Consult for Vancomycin, Zosyn Indication: Febrile Neutropenia  Allergies  Allergen Reactions  . Crestor [Rosuvastatin] Other (See Comments)    myalgias  . Lipitor [Atorvastatin] Other (See Comments)    myalgias  . Temazepam Other (See Comments)    Pt does not remember    Patient Measurements: Height: '5\' 5"'$  (165.1 cm) Weight: 143 lb (64.864 kg) IBW/kg (Calculated) : 57  Vital Signs: Temp: 99.1 F (37.3 C) (07/04 0455) Temp Source: Oral (07/04 0455) BP: 109/64 mmHg (07/04 0455) Pulse Rate: 92 (07/04 0455) Intake/Output from previous day: 07/03 0701 - 07/04 0700 In: 1080 [P.O.:1080] Out: 2050 [Urine:2050] Intake/Output from this shift:    Labs:  Recent Labs  09/09/14 2336 09/11/14 0440  WBC 0.6* 0.4*  HGB 10.9* 10.5*  PLT 126* 97*  CREATININE 0.43*  --    Estimated Creatinine Clearance: 74 mL/min (by C-G formula based on Cr of 0.43). No results for input(s): VANCOTROUGH, VANCOPEAK, VANCORANDOM, GENTTROUGH, GENTPEAK, GENTRANDOM, TOBRATROUGH, TOBRAPEAK, TOBRARND, AMIKACINPEAK, AMIKACINTROU, AMIKACIN in the last 72 hours.   Microbiology: Recent Results (from the past 720 hour(s))  Blood culture (routine x 2)     Status: None (Preliminary result)   Collection Time: 09/09/14 11:38 PM  Result Value Ref Range Status   Specimen Description   Final    BLOOD RIGHT ANTECUBITAL Performed at [facility]    Special Requests BOTTLES DRAWN AEROBIC AND ANAEROBIC 5ML  Final   Culture PENDING  Incomplete   Report Status PENDING  Incomplete  Blood culture (routine x 2)     Status: None (Preliminary result)   Collection Time: 09/10/14  1:26 AM  Result Value Ref Range Status   Specimen Description   Final    BLOOD BLOOD RIGHT FOREARM Performed at [facility]    Special Requests BOTTLES DRAWN AEROBIC AND ANAEROBIC 5CC  Final   Culture PENDING  Incomplete   Report Status PENDING  Incomplete    Medical  History: Past Medical History  Diagnosis Date  . Cervical stenosis (uterine cervix)   . Abnormal Pap smear of cervix     Dr. Eilleen Kempf   . Hyperlipidemia   . Dizziness   . Insomnia   . Nervousness(799.21)   . Meniere's disease   . Benign hemangioma     Dr,  Marybelle Killings   . Allergy   . Breast cancer 07/20/14    Left Breast  . Anxiety   . Depression     Assessment: 54 yr female with breast cancer, who underwent first cycle of chemotherapy on 6/27 presented to [facility] ED on 7/3 with reported at home of temp of 102.8. Pharmacy initially consulted to dose Zosyn for febrile neutropenia. Vancomycin subsequently added due to presence of PAC.  Blood cultures pending  ANC 0, s/p Neulasta 6/27, now on daily Neupogen until counts improve  SCr 0.43, CrCl 93 ml/min/1.34m (normalized)  Tmax 99.37F  Goal of Therapy:  Vancomycin trough level 15-20 mcg/ml Appropriate antibiotic dosing for renal function and indication Eradication of infection  Plan:   Continue present doses of Vancomycin and Zosyn. Check trough level and SCr prior to Vancomycin dose due this evening at 1800.  Follow up renal function, cultures, clinical course.   JLindell Spar PharmD, BCPS Pager: [phone]/06/2014 7:35 AM

## 2014-09-11 NOTE — Discharge Summary (Signed)
Physician Discharge Summary  [name] YIR:[id number] DOB: [birth year] DOA: 09/09/2014  PCP: Redge Gainer, MD  Admit date: 09/09/2014 Discharge date: 09/11/2014   Recommendations for Outpatient Follow-Up:   1. The patient will be seen at the cancer center 09/12/14 for repeat labs. 2. Follow up final blood culture results.   Discharge Diagnosis:   Principal Problem:    Neutropenic fever Active Problems:    Breast cancer of upper-outer quadrant of left female breast    Febrile neutropenia  Discharge disposition:  Home.    Discharge Condition: Improved.  Diet recommendation:  Regular.   History of Present Illness:   [name] is an 54 y.o. female with a PMH of left-sided breast cancer status post lumpectomy and medial margin reexcision for residual disease as well as adjuvant chemotherapy with Adriamycin and Cytoxan 4 followed by Abraxane weekly 12, and now receiving dose dense chemotherapy with Adriamycin and Cytoxan followed by Neulasta support with first treatment on 09/04/14, who was admitted 09/09/14 with neutropenic fever.  Hospital Course by Problem:   Principal Problem:   Neutropenic fever / febrile neutropenia - Patient was treated with empiric vancomycin and Zosyn. - Follow-up blood cultures. - Urinalysis without signs of infection. Chest x-ray reportedly clear, but cannot find results in Epic. - Given a dose of Granix 09/10/14. ANC 0. - Spoke with Dr. Jana Hakim who recommended discharge home on Augmentin and Cipro with F/U at the [facility] 09/12/14.  Active Problems:  Chemotherapy associated pancytopenia - All cell lines down. Given Granix 09/10/14. - No current indication for PRBCs or platelets. - Continue to monitor counts closely.   Breast cancer of upper-outer quadrant of left female breast - Status post Adriamycin and Cytoxan with Neulasta given 09/04/14. Burnis Medin notify her oncologist of her admission.   Medical Consultants:     Telephone consult with Dr. Jana Hakim   Discharge Exam:   Filed Vitals:   09/11/14 0455  BP: 109/64  Pulse: 92  Temp: 99.1 F (37.3 C)  Resp: 18   Filed Vitals:   09/10/14 0338 09/10/14 1321 09/10/14 2028 09/11/14 0455  BP: 112/68 123/57 112/59 109/64  Pulse: 74 64 77 92  Temp: 98.4 F (36.9 C) 98.5 F (36.9 C) 98.5 F (36.9 C) 99.1 F (37.3 C)  TempSrc: Oral Oral Oral Oral  Resp: '18 16 16 18  '$ Height:      Weight:      SpO2: 100% 99% 97% 97%    Gen:  NAD Cardiovascular:  RRR, No M/R/G Respiratory: Lungs CTAB Gastrointestinal: Abdomen soft, NT/ND with normal active bowel sounds. Extremities: No C/E/C   The results of significant diagnostics from this hospitalization (including imaging, microbiology, ancillary and laboratory) are listed below for reference.     Procedures and Diagnostic Studies:   No results found.   Labs:   Basic Metabolic Panel:  Recent Labs Lab 09/09/14 2336  NA 132*  K 3.6  CL 99*  CO2 25  GLUCOSE 96  BUN 15  CREATININE 0.43*  CALCIUM 8.2*   GFR Estimated Creatinine Clearance: 74 mL/min (by C-G formula based on Cr of 0.43). Liver Function Tests:  Recent Labs Lab 09/09/14 2336  AST 19  ALT 26  ALKPHOS 70  BILITOT 0.8  PROT 6.2*  ALBUMIN 3.5   CBC:  Recent Labs Lab 09/09/14 2336 09/11/14 0440  WBC 0.6* 0.4*  NEUTROABS 0.2* 0.0*  HGB 10.9* 10.5*  HCT 34.3* 32.6*  MCV 94.2 95.9  PLT 126* 97*  Microbiology Recent Results (from the past 240 hour(s))  Blood culture (routine x 2)     Status: None (Preliminary result)   Collection Time: 09/09/14 11:38 PM  Result Value Ref Range Status   Specimen Description   Final    BLOOD RIGHT ANTECUBITAL Performed at [facility]    Special Requests BOTTLES DRAWN AEROBIC AND ANAEROBIC 5ML  Final   Culture PENDING  Incomplete   Report Status PENDING  Incomplete  Blood culture (routine x 2)     Status: None (Preliminary result)   Collection Time: 09/10/14  1:26  AM  Result Value Ref Range Status   Specimen Description   Final    BLOOD BLOOD RIGHT FOREARM Performed at [facility]    Special Requests BOTTLES DRAWN AEROBIC AND ANAEROBIC 5CC  Final   Culture PENDING  Incomplete   Report Status PENDING  Incomplete     Discharge Instructions:   Discharge Instructions    Call MD for:  difficulty breathing, headache or visual disturbances    Complete by:  As directed      Call MD for:  extreme fatigue    Complete by:  As directed      Call MD for:  persistant dizziness or light-headedness    Complete by:  As directed      Call MD for:  persistant nausea and vomiting    Complete by:  As directed      Call MD for:  temperature >100.4    Complete by:  As directed      Diet general    Complete by:  As directed   Avoid raw fruits and vegetables (except Oranges and Bananas) while you are neutropenic.     Increase activity slowly    Complete by:  As directed             Medication List    TAKE these medications        acetaminophen 325 MG tablet  Commonly known as:  TYLENOL  Take 650 mg by mouth every 6 (six) hours as needed for moderate pain.     amoxicillin-clavulanate 875-125 MG per tablet  Commonly known as:  AUGMENTIN  Take 1 tablet by mouth 2 (two) times daily.     clonazePAM 0.5 MG tablet  Commonly known as:  KLONOPIN  TAKE 1 TABLET THREE TIMES A DAY     dexamethasone 4 MG tablet  Commonly known as:  DECADRON  Take 1 tablets by mouth once a day on the day after chemotherapy and then take 1 tablets two times a day for 2 days. Take with food.     hydroxypropyl methylcellulose / hypromellose 2.5 % ophthalmic solution  Commonly known as:  ISOPTO TEARS / GONIOVISC  Place 1 drop into both eyes 3 (three) times daily as needed for dry eyes.     levofloxacin 750 MG tablet  Commonly known as:  LEVAQUIN  Take 1 tablet (750 mg total) by mouth daily.     lidocaine-prilocaine cream  Commonly known as:  EMLA  Apply to  affected area once     LORazepam 0.5 MG tablet  Commonly known as:  ATIVAN  Take 1 tablet (0.5 mg total) by mouth at bedtime.     ondansetron 8 MG tablet  Commonly known as:  ZOFRAN  Take 1 tablet (8 mg total) by mouth 2 (two) times daily as needed for nausea. Start on the third day after chemotherapy.     PARoxetine 25  MG 24 hr tablet  Commonly known as:  PAXIL-CR  TAKE 1 TABLET BY MOUTH EVERY DAY     prochlorperazine 10 MG tablet  Commonly known as:  COMPAZINE  Take 1 tablet (10 mg total) by mouth every 6 (six) hours as needed (Nausea or vomiting).           Follow-up Information    Call Redge Gainer, MD.   Specialty:  Family Medicine   Why:  As needed   Contact information:   [place] [address] [phone]       Follow up with Rulon Eisenmenger, MD. Schedule an appointment as soon as possible for a visit in 1 day.   Specialty:  Hematology and Oncology   Why:  Hospital follow up, to check your blood count.   Contact information:   [place] [id number] [phone]        Time coordinating discharge: 35 minutes.  Signed:  Etheline Geppert  Pager [phone] [facility] 09/11/2014, 3:05 PM

## 2014-09-12 ENCOUNTER — Other Ambulatory Visit: Payer: Self-pay | Admitting: *Deleted

## 2014-09-12 ENCOUNTER — Telehealth: Payer: Self-pay | Admitting: *Deleted

## 2014-09-12 DIAGNOSIS — C50412 Malignant neoplasm of upper-outer quadrant of left female breast: Secondary | ICD-10-CM

## 2014-09-12 NOTE — Telephone Encounter (Signed)
VM message from patient received @ 8:17 am. Pt states she was hospitalized over the weekend for febrile neutropenia. She was discharged home yesterday-ANC/WBC remain low pwer labs on 09/11/14.  Patient was told to have labs repeated and to see Dr. Lindi Adie today for f/u.

## 2014-09-12 NOTE — Telephone Encounter (Signed)
Dawn spoke to patient, she will keep her appts for 7/7.

## 2014-09-12 NOTE — Telephone Encounter (Signed)
Pt called and relayed she was hospitalized for fever/neutropenia. Relate she is feeling fine and without complaints. Informed Dr. Lindi Adie. She will keep her appt on 7/7 for lab and f/u. She knows to call with any fever. Denies further needs at this time. Encourage pt to call with questions or concerns.

## 2014-09-14 ENCOUNTER — Encounter: Payer: Self-pay | Admitting: Hematology and Oncology

## 2014-09-14 ENCOUNTER — Other Ambulatory Visit (HOSPITAL_BASED_OUTPATIENT_CLINIC_OR_DEPARTMENT_OTHER): Payer: PRIVATE HEALTH INSURANCE

## 2014-09-14 ENCOUNTER — Ambulatory Visit (HOSPITAL_BASED_OUTPATIENT_CLINIC_OR_DEPARTMENT_OTHER): Payer: PRIVATE HEALTH INSURANCE | Admitting: Hematology and Oncology

## 2014-09-14 ENCOUNTER — Telehealth: Payer: Self-pay | Admitting: Hematology and Oncology

## 2014-09-14 VITALS — BP 106/72 | HR 89 | Temp 98.7°F | Resp 18 | Ht 65.0 in | Wt 143.1 lb

## 2014-09-14 DIAGNOSIS — D696 Thrombocytopenia, unspecified: Secondary | ICD-10-CM

## 2014-09-14 DIAGNOSIS — C50412 Malignant neoplasm of upper-outer quadrant of left female breast: Secondary | ICD-10-CM | POA: Diagnosis not present

## 2014-09-14 DIAGNOSIS — Z170 Estrogen receptor positive status [ER+]: Secondary | ICD-10-CM | POA: Diagnosis not present

## 2014-09-14 DIAGNOSIS — C773 Secondary and unspecified malignant neoplasm of axilla and upper limb lymph nodes: Secondary | ICD-10-CM

## 2014-09-14 LAB — COMPREHENSIVE METABOLIC PANEL (CC13)
ALK PHOS: 67 U/L (ref 40–150)
ALT: 23 U/L (ref 0–55)
ANION GAP: 9 meq/L (ref 3–11)
AST: 20 U/L (ref 5–34)
Albumin: 3.3 g/dL — ABNORMAL LOW (ref 3.5–5.0)
BILIRUBIN TOTAL: 0.22 mg/dL (ref 0.20–1.20)
BUN: 9.9 mg/dL (ref 7.0–26.0)
CO2: 22 mEq/L (ref 22–29)
CREATININE: 0.6 mg/dL (ref 0.6–1.1)
Calcium: 8.5 mg/dL (ref 8.4–10.4)
Chloride: 111 mEq/L — ABNORMAL HIGH (ref 98–109)
GLUCOSE: 95 mg/dL (ref 70–140)
Potassium: 3.9 mEq/L (ref 3.5–5.1)
Sodium: 142 mEq/L (ref 136–145)
Total Protein: 6 g/dL — ABNORMAL LOW (ref 6.4–8.3)

## 2014-09-14 LAB — CBC WITH DIFFERENTIAL/PLATELET
BASO%: 1 % (ref 0.0–2.0)
Basophils Absolute: 0 10*3/uL (ref 0.0–0.1)
EOS ABS: 0 10*3/uL (ref 0.0–0.5)
EOS%: 0.7 % (ref 0.0–7.0)
HCT: 33.7 % — ABNORMAL LOW (ref 34.8–46.6)
HGB: 11.2 g/dL — ABNORMAL LOW (ref 11.6–15.9)
LYMPH%: 14.6 % (ref 14.0–49.7)
MCH: 30.8 pg (ref 25.1–34.0)
MCHC: 33.1 g/dL (ref 31.5–36.0)
MCV: 93.2 fL (ref 79.5–101.0)
MONO#: 0.3 10*3/uL (ref 0.1–0.9)
MONO%: 10.5 % (ref 0.0–14.0)
NEUT#: 2.3 10*3/uL (ref 1.5–6.5)
NEUT%: 73.2 % (ref 38.4–76.8)
Platelets: 137 10*3/uL — ABNORMAL LOW (ref 145–400)
RBC: 3.62 10*6/uL — ABNORMAL LOW (ref 3.70–5.45)
RDW: 13.9 % (ref 11.2–14.5)
WBC: 3.2 10*3/uL — ABNORMAL LOW (ref 3.9–10.3)
lymph#: 0.5 10*3/uL — ABNORMAL LOW (ref 0.9–3.3)

## 2014-09-14 NOTE — Telephone Encounter (Signed)
Gave avs & calendar for July °

## 2014-09-14 NOTE — Progress Notes (Signed)
Patient Care Team: Chipper Herb, MD as PCP - General (Family Medicine)  DIAGNOSIS: No matching staging information was found for the patient.  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, with LCIS, medial and inferior margins positive, 3/4 lymph nodes positive T2 N1 cM0 stage IIB   08/16/2014 Surgery Left breast medial margin reexcision residual ILC 0.2 cm; inferior margin residual foci less than 0.2 cm, 1/5 lymph nodes positive, 1 lymph node with isolated tumor cells (Overall 4/10)   09/04/2014 -  Chemotherapy Adjuvant chemotherapy with dose dense Adriamycin and Cytoxan 4 followed by Abraxane weekly 12    CHIEF COMPLIANT: cycle 1 day 11 Adriamycin Cytoxan  INTERVAL HISTORY: [name] is a 54 year old with above-mentioned history of left breast cancer currently on adjuvant chemotherapy. She received cycle 1 of treatment and did very well for several days until Saturday when she started noticing a temperature of 102 and she came down to the emergency room and was admitted to the hospital for neutropenic fever. Blood cultures chest x-rays were all obtained and she was start on broad-spectrum antibodies. All of the cultures have come back negative. Her fevers have resolved without any further problems. She was discharged from the hospital upon her insistence. She felt great and had no further problems since then.  REVIEW OF SYSTEMS:   Constitutional: Denies fevers, chills or  abnormal weight loss Eyes: Denies blurriness of vision Ears, nose, mouth, throat, and face: Denies mucositis or sore throat Respiratory: Denies cough, dyspnea or wheezes Cardiovascular: Denies palpitation, chest discomfort or lower extremity swelling Gastrointestinal:  Denies nausea, heartburn or change in bowel habits Skin: Denies abnormal skin rashes Lymphatics: Denies new lymphadenopathy or easy bruising Neurological:Denies numbness, tingling or new weaknesses Behavioral/Psych: Mood is stable, no new changes   All other systems were reviewed with the patient and are negative.  I have reviewed the past medical history, past surgical history, social history and family history with the patient and they are unchanged from previous note.  ALLERGIES:  is allergic to crestor; lipitor; and temazepam.  MEDICATIONS:  Current Outpatient Prescriptions  Medication Sig Dispense Refill  . acetaminophen (TYLENOL) 325 MG tablet Take 650 mg by mouth every 6 (six) hours as needed for moderate pain.    Marland Kitchen amoxicillin-clavulanate (AUGMENTIN) 875-125 MG per tablet Take 1 tablet by mouth 2 (two) times daily. 12 tablet 0  . clonazePAM (KLONOPIN) 0.5 MG tablet TAKE 1 TABLET THREE TIMES A DAY 90 tablet 2  . dexamethasone (DECADRON) 4 MG tablet Take 1 tablets by mouth once a day on the day after chemotherapy and then take 1 tablets two times a day for 2 days. Take with food. (Patient taking differently: Take 4 mg by mouth 2 (two) times daily as needed (nausea). Take 1 tablets by mouth once a day on the day after chemotherapy and then take 1 tablets two times a day for 2 days. Take with food.) 30 tablet 1  .  hydroxypropyl methylcellulose / hypromellose (ISOPTO TEARS / GONIOVISC) 2.5 % ophthalmic solution Place 1 drop into both eyes 3 (three) times daily as needed for dry eyes.    Marland Kitchen levofloxacin (LEVAQUIN) 750 MG tablet Take 1 tablet (750 mg total) by mouth daily. 6 tablet 0  . lidocaine-prilocaine (EMLA) cream Apply  to affected area once 30 g 3  . LORazepam (ATIVAN) 0.5 MG tablet Take 1 tablet (0.5 mg total) by mouth at bedtime. 30 tablet 0  . ondansetron (ZOFRAN) 8 MG tablet Take 1 tablet (8 mg total) by mouth 2 (two) times daily as needed for nausea. Start on the third day after chemotherapy. 30 tablet 1  . PARoxetine (PAXIL-CR) 25 MG 24 hr tablet TAKE 1 TABLET BY MOUTH EVERY DAY 90 tablet 2  . prochlorperazine (COMPAZINE) 10 MG tablet Take 1 tablet (10 mg total) by mouth every 6 (six) hours as needed (Nausea or vomiting). 30 tablet 1   No current facility-administered medications for this visit.    PHYSICAL EXAMINATION: ECOG PERFORMANCE STATUS: 0 - Asymptomatic  Filed Vitals:   09/14/14 0834  BP: 106/72  Pulse: 89  Temp: 98.7 F (37.1 C)  Resp: 18   Filed Weights   09/14/14 0834  Weight: 143 lb 1.6 oz (64.91 kg)    GENERAL:alert, no distress and comfortable SKIN: skin color, texture, turgor are normal, no rashes or significant lesions EYES: normal, Conjunctiva are pink and non-injected, sclera clear OROPHARYNX:no exudate, no erythema and lips, buccal mucosa, and tongue normal  NECK: supple, thyroid normal size, non-tender, without nodularity LYMPH:  no palpable lymphadenopathy in the cervical, axillary or inguinal LUNGS: clear to auscultation and percussion with normal breathing effort HEART: regular rate & rhythm and no murmurs and no lower extremity edema ABDOMEN:abdomen soft, non-tender and normal bowel sounds Musculoskeletal:no cyanosis of digits and no clubbing  NEURO: alert & oriented x 3 with fluent speech, no focal motor/sensory deficits   LABORATORY DATA:  I have reviewed the data as listed   Chemistry      Component Value Date/Time   NA 132* 09/09/2014 2336   NA 139 09/04/2014 0956   NA 141 07/21/2013 1023   K 3.6 09/09/2014 2336   K 3.9 09/04/2014 0956   CL 99* 09/09/2014 2336   CO2 25 09/09/2014 2336   CO2 28 09/04/2014 0956   BUN 15 09/09/2014 2336   BUN 13.2  09/04/2014 0956   BUN 14 07/21/2013 1023   CREATININE 0.43* 09/09/2014 2336   CREATININE 0.6 09/04/2014 0956   CREATININE 0.54 10/04/2012 1123      Component Value Date/Time   CALCIUM 8.2* 09/09/2014 2336   CALCIUM 8.5 09/04/2014 0956   ALKPHOS 70 09/09/2014 2336   ALKPHOS 62 09/04/2014 0956   AST 19 09/09/2014 2336   AST 20 09/04/2014 0956   ALT 26 09/09/2014 2336   ALT 20 09/04/2014 0956   BILITOT 0.8 09/09/2014 2336   BILITOT 0.54 09/04/2014 0956       Lab Results  Component Value Date   WBC 3.2* 09/14/2014   HGB 11.2* 09/14/2014   HCT 33.7* 09/14/2014   MCV 93.2 09/14/2014   PLT 137* 09/14/2014   NEUTROABS 2.3 09/14/2014   ASSESSMENT & PLAN:  Breast cancer of upper-outer quadrant of left female breast Left breast lumpectomy 08/08/2014: 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 Adjuvant chemotherapy with dose dense Adriamycin and Cytoxan x 4 followed by Abraxane weekly 12 because of lymph node positive disease. 2. Followed by adjuvant radiation 3. Followed by antiestrogen therapy  Current Treatment: Today is cycle 1 day 11 dose dense Adriamycin and Cytoxan Chemotoxicities: 1. Hospitalization for neutropenic fever: Blood cultures have all been negative as well as chest x-ray. She was given antibiotics and discharged home. 2. Fatigue She will receive cycle 2 of chemotherapy on 09/18/2014. She plans to travel on an airplane 09/25/2014. I recommended that she get a blood count check on 7/18 prior to her going on her trip.I will reduce the dosage of chemotherapy for cycle 2. 3. Thrombocytopenia:  Improving  Return to clinic on 07/11/2016for cycle 2. Since her blood counts have been reviewed today, I will see her back with cycle 3.   No orders of the defined types were placed in this encounter.   The patient has a good understanding of the overall plan. she agrees with it. she will call with any problems that may develop before the next visit here.   Rulon Eisenmenger, MD

## 2014-09-14 NOTE — Assessment & Plan Note (Signed)
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 Adjuvant chemotherapy with dose dense Adriamycin and Cytoxan x 4 followed by Abraxane weekly 12 because of lymph node positive disease. 2. Followed by adjuvant radiation 3. Followed by antiestrogen therapy  Current Treatment: Today is cycle 1 day 8 dose dense Adriamycin and Cytoxan Chemotoxicities:   Return to clinic in 1 week for cycle 2

## 2014-09-15 LAB — CULTURE, BLOOD (ROUTINE X 2)
Culture: NO GROWTH
Culture: NO GROWTH

## 2014-09-18 ENCOUNTER — Other Ambulatory Visit (HOSPITAL_BASED_OUTPATIENT_CLINIC_OR_DEPARTMENT_OTHER): Payer: PRIVATE HEALTH INSURANCE

## 2014-09-18 ENCOUNTER — Telehealth: Payer: Self-pay | Admitting: Hematology and Oncology

## 2014-09-18 ENCOUNTER — Ambulatory Visit (HOSPITAL_COMMUNITY)
Admission: RE | Admit: 2014-09-18 | Discharge: 2014-09-18 | Disposition: A | Discharge status: Home / Self Care | Payer: PRIVATE HEALTH INSURANCE | Source: Ambulatory Visit | Attending: Hematology and Oncology | Admitting: Hematology and Oncology

## 2014-09-18 ENCOUNTER — Ambulatory Visit (HOSPITAL_BASED_OUTPATIENT_CLINIC_OR_DEPARTMENT_OTHER): Payer: PRIVATE HEALTH INSURANCE | Admitting: Hematology and Oncology

## 2014-09-18 ENCOUNTER — Ambulatory Visit: Payer: PRIVATE HEALTH INSURANCE

## 2014-09-18 ENCOUNTER — Encounter: Payer: Self-pay | Admitting: Hematology and Oncology

## 2014-09-18 ENCOUNTER — Other Ambulatory Visit: Payer: Self-pay

## 2014-09-18 ENCOUNTER — Ambulatory Visit (HOSPITAL_BASED_OUTPATIENT_CLINIC_OR_DEPARTMENT_OTHER): Payer: PRIVATE HEALTH INSURANCE

## 2014-09-18 VITALS — BP 99/65 | HR 92 | Temp 98.4°F | Resp 18

## 2014-09-18 DIAGNOSIS — D696 Thrombocytopenia, unspecified: Secondary | ICD-10-CM

## 2014-09-18 DIAGNOSIS — Z170 Estrogen receptor positive status [ER+]: Secondary | ICD-10-CM

## 2014-09-18 DIAGNOSIS — Z5189 Encounter for other specified aftercare: Secondary | ICD-10-CM

## 2014-09-18 DIAGNOSIS — C50412 Malignant neoplasm of upper-outer quadrant of left female breast: Secondary | ICD-10-CM

## 2014-09-18 DIAGNOSIS — R509 Fever, unspecified: Secondary | ICD-10-CM | POA: Diagnosis present

## 2014-09-18 DIAGNOSIS — Z95828 Presence of other vascular implants and grafts: Secondary | ICD-10-CM

## 2014-09-18 DIAGNOSIS — C50919 Malignant neoplasm of unspecified site of unspecified female breast: Secondary | ICD-10-CM | POA: Insufficient documentation

## 2014-09-18 DIAGNOSIS — Z5111 Encounter for antineoplastic chemotherapy: Secondary | ICD-10-CM | POA: Diagnosis not present

## 2014-09-18 DIAGNOSIS — C773 Secondary and unspecified malignant neoplasm of axilla and upper limb lymph nodes: Secondary | ICD-10-CM | POA: Diagnosis not present

## 2014-09-18 LAB — URINALYSIS, MICROSCOPIC - CHCC
BILIRUBIN (URINE): NEGATIVE
Blood: NEGATIVE
GLUCOSE UR CHCC: NEGATIVE mg/dL
Ketones: NEGATIVE mg/dL
Leukocyte Esterase: NEGATIVE
NITRITE: NEGATIVE
PH: 5 (ref 4.6–8.0)
PROTEIN: NEGATIVE mg/dL
SPECIFIC GRAVITY, URINE: 1.02 (ref 1.003–1.035)
Urobilinogen, UR: 0.2 mg/dL (ref 0.2–1)

## 2014-09-18 LAB — CBC WITH DIFFERENTIAL/PLATELET
BASO%: 1 % (ref 0.0–2.0)
BASOS ABS: 0.1 10*3/uL (ref 0.0–0.1)
EOS ABS: 0 10*3/uL (ref 0.0–0.5)
EOS%: 0.1 % (ref 0.0–7.0)
HEMATOCRIT: 35.6 % (ref 34.8–46.6)
HGB: 11.7 g/dL (ref 11.6–15.9)
LYMPH%: 18.5 % (ref 14.0–49.7)
MCH: 30.4 pg (ref 25.1–34.0)
MCHC: 32.9 g/dL (ref 31.5–36.0)
MCV: 92.4 fL (ref 79.5–101.0)
MONO#: 0.6 10*3/uL (ref 0.1–0.9)
MONO%: 10.4 % (ref 0.0–14.0)
NEUT%: 70 % (ref 38.4–76.8)
NEUTROS ABS: 3.7 10*3/uL (ref 1.5–6.5)
Platelets: 139 10*3/uL — ABNORMAL LOW (ref 145–400)
RBC: 3.85 10*6/uL (ref 3.70–5.45)
RDW: 14.5 % (ref 11.2–14.5)
WBC: 5.3 10*3/uL (ref 3.9–10.3)
lymph#: 1 10*3/uL (ref 0.9–3.3)

## 2014-09-18 LAB — COMPREHENSIVE METABOLIC PANEL (CC13)
ALT: 43 U/L (ref 0–55)
AST: 41 U/L — AB (ref 5–34)
Albumin: 3.3 g/dL — ABNORMAL LOW (ref 3.5–5.0)
Alkaline Phosphatase: 81 U/L (ref 40–150)
Anion Gap: 8 mEq/L (ref 3–11)
BUN: 8.5 mg/dL (ref 7.0–26.0)
CO2: 23 mEq/L (ref 22–29)
Calcium: 8.6 mg/dL (ref 8.4–10.4)
Chloride: 107 mEq/L (ref 98–109)
Creatinine: 0.6 mg/dL (ref 0.6–1.1)
EGFR: >90 mL/min/{1.73_m2} (ref >90)
GLUCOSE: 89 mg/dL (ref 70–140)
Potassium: 3.9 mEq/L (ref 3.5–5.1)
Sodium: 139 mEq/L (ref 136–145)
Total Bilirubin: 0.33 mg/dL (ref 0.20–1.20)
Total Protein: 6 g/dL — ABNORMAL LOW (ref 6.4–8.3)

## 2014-09-18 MED ORDER — PALONOSETRON HCL INJECTION 0.25 MG/5ML
INTRAVENOUS | Status: AC
  Filled 2014-09-18: qty 5

## 2014-09-18 MED ORDER — SODIUM CHLORIDE 0.9 % IJ SOLN
10.0000 mL | INTRAMUSCULAR | Status: DC | PRN
  Administered 2014-09-18: 10 mL via INTRAVENOUS
  Filled 2014-09-18: qty 10

## 2014-09-18 MED ORDER — HEPARIN SOD (PORK) LOCK FLUSH 100 UNIT/ML IV SOLN
500.0000 [IU] | Freq: Once | INTRAVENOUS | Status: AC | PRN
  Administered 2014-09-18: 500 [IU]
  Filled 2014-09-18: qty 5

## 2014-09-18 MED ORDER — SODIUM CHLORIDE 0.9 % IV SOLN
Freq: Once | INTRAVENOUS | Status: AC
  Administered 2014-09-18: 12:00:00 via INTRAVENOUS

## 2014-09-18 MED ORDER — SODIUM CHLORIDE 0.9 % IV SOLN
Freq: Once | INTRAVENOUS | Status: AC
  Administered 2014-09-18: 12:00:00 via INTRAVENOUS
  Filled 2014-09-18: qty 5

## 2014-09-18 MED ORDER — PALONOSETRON HCL INJECTION 0.25 MG/5ML
0.2500 mg | Freq: Once | INTRAVENOUS | Status: AC
  Administered 2014-09-18: 0.25 mg via INTRAVENOUS

## 2014-09-18 MED ORDER — SODIUM CHLORIDE 0.9 % IV SOLN
500.0000 mg/m2 | Freq: Once | INTRAVENOUS | Status: AC
  Administered 2014-09-18: 860 mg via INTRAVENOUS
  Filled 2014-09-18: qty 43

## 2014-09-18 MED ORDER — SODIUM CHLORIDE 0.9 % IJ SOLN
10.0000 mL | INTRAMUSCULAR | Status: DC | PRN
  Administered 2014-09-18: 10 mL
  Filled 2014-09-18: qty 10

## 2014-09-18 MED ORDER — HEPARIN SOD (PORK) LOCK FLUSH 100 UNIT/ML IV SOLN
500.0000 [IU] | Freq: Once | INTRAVENOUS | Status: DC
Start: 2014-09-18 — End: 2014-09-18
  Filled 2014-09-18: qty 5

## 2014-09-18 MED ORDER — DOXORUBICIN HCL CHEMO IV INJECTION 2 MG/ML
50.0000 mg/m2 | Freq: Once | INTRAVENOUS | Status: AC
  Administered 2014-09-18: 86 mg via INTRAVENOUS
  Filled 2014-09-18: qty 43

## 2014-09-18 MED ORDER — PEGFILGRASTIM 6 MG/0.6ML ~~LOC~~ PSKT
6.0000 mg | PREFILLED_SYRINGE | Freq: Once | SUBCUTANEOUS | Status: AC
  Administered 2014-09-18: 6 mg via SUBCUTANEOUS
  Filled 2014-09-18: qty 0.6

## 2014-09-18 NOTE — Progress Notes (Signed)
Patient here for Cycle 2, Day 1 of Adriamycin/Cytoxan. Patient states that she developed a fever between Saturday/Sunday with a high of 101.6. Patient denies pain, cough, or nausea. Patient did not go to the ED and treated fever with Tylenol, water, and cold compresses. Patient states last Tylenol was at 2 am this morning. No labs drawn upon arrival this AM. MD Gudena/ RN Terri notified and instructed patient to be seen in office. Escorted to one of MD Gudena patient rooms to be seen.

## 2014-09-18 NOTE — Progress Notes (Signed)
Patient Care Team: Chipper Herb, MD as PCP - General (Family Medicine)  DIAGNOSIS: No matching staging information was found for the patient.  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 She is here today to receive cycle 2 of chemotherapy with dose dense Adriamycin and Cytoxan. She had a fever of 101.7 at home but because she was in antibiotics she did not call us or come to the emergency room. Today when she arrived she had a temperature of 100.7 but on recheck it was 98.7. She does not feel ill or sick. Denies any cough expectoration denies any urinary burning or discomfort. Denies any diarrhea. Her bowels are normal. She otherwise feels very well.  REVIEW OF SYSTEMS:   Constitutional: Denies fevers, chills or abnormal weight loss Eyes: Denies blurriness of vision Ears, nose, mouth, throat, and  face: Denies mucositis or sore throat Respiratory: Denies cough, dyspnea or wheezes Cardiovascular: Denies palpitation, chest discomfort or lower extremity swelling Gastrointestinal:  Denies nausea, heartburn or change in bowel habits Skin: Denies abnormal skin rashes Lymphatics: Denies new lymphadenopathy or easy bruising Neurological:Denies numbness, tingling or new weaknesses Behavioral/Psych: Mood is stable, no new changes  All other systems were reviewed with the patient and are negative.  I have reviewed the past medical history, past surgical history, social history and family history with the patient and they are unchanged from previous note.  ALLERGIES:  is allergic to crestor; lipitor; and temazepam.  MEDICATIONS:  Current Outpatient Prescriptions  Medication Sig Dispense Refill  . acetaminophen (TYLENOL) 325 MG tablet Take 650 mg by mouth every 6 (six) hours as needed for moderate pain.    Marland Kitchen amoxicillin-clavulanate (AUGMENTIN) 875-125 MG per tablet Take 1 tablet by mouth 2 (two) times daily. 12 tablet 0  . clonazePAM (KLONOPIN) 0.5 MG tablet TAKE 1 TABLET THREE TIMES A DAY 90 tablet 2  . dexamethasone (DECADRON) 4 MG tablet Take 1 tablets by mouth once a day on the day after chemotherapy and then take 1 tablets two times a day for 2 days. Take with food. (Patient taking differently: Take 4 mg by mouth 2 (two) times daily as needed (nausea). Take 1 tablets by mouth once a day on the day after chemotherapy and then take 1 tablets two times a day for 2 days. Take with food.) 30 tablet 1  . hydroxypropyl methylcellulose / hypromellose (ISOPTO TEARS / GONIOVISC)  2.5 % ophthalmic solution Place 1 drop into both eyes 3 (three) times daily as needed for dry eyes.    Marland Kitchen levofloxacin (LEVAQUIN) 750 MG tablet Take 1 tablet (750 mg total) by mouth daily. 6 tablet 0  . lidocaine-prilocaine (EMLA) cream Apply to affected area once 30 g 3  . LORazepam (ATIVAN) 0.5 MG tablet Take 1 tablet (0.5 mg  total) by mouth at bedtime. 30 tablet 0  . ondansetron (ZOFRAN) 8 MG tablet Take 1 tablet (8 mg total) by mouth 2 (two) times daily as needed for nausea. Start on the third day after chemotherapy. 30 tablet 1  . PARoxetine (PAXIL-CR) 25 MG 24 hr tablet TAKE 1 TABLET BY MOUTH EVERY DAY 90 tablet 2  . prochlorperazine (COMPAZINE) 10 MG tablet Take 1 tablet (10 mg total) by mouth every 6 (six) hours as needed (Nausea or vomiting). 30 tablet 1   No current facility-administered medications for this visit.    PHYSICAL EXAMINATION: ECOG PERFORMANCE STATUS: 1 - Symptomatic but completely ambulatory  There were no vitals filed for this visit. There were no vitals filed for this visit.  GENERAL:alert, no distress and comfortable SKIN: skin color, texture, turgor are normal, no rashes or significant lesions EYES: normal, Conjunctiva are pink and non-injected, sclera clear OROPHARYNX:no exudate, no erythema and lips, buccal mucosa, and tongue normal  NECK: supple, thyroid normal size, non-tender, without nodularity LYMPH:  no palpable lymphadenopathy in the cervical, axillary or inguinal LUNGS: clear to auscultation and percussion with normal breathing effort HEART: regular rate & rhythm and no murmurs and no lower extremity edema ABDOMEN:abdomen soft, non-tender and normal bowel sounds Musculoskeletal:no cyanosis of digits and no clubbing  NEURO: alert & oriented x 3 with fluent speech, no focal motor/sensory deficits   LABORATORY DATA:  I have reviewed the data as listed   Chemistry      Component Value Date/Time   NA 142 09/14/2014 0820   NA 132* 09/09/2014 2336   NA 141 07/21/2013 1023   K 3.9 09/14/2014 0820   K 3.6 09/09/2014 2336   CL 99* 09/09/2014 2336   CO2 22 09/14/2014 0820   CO2 25 09/09/2014 2336   BUN 9.9 09/14/2014 0820   BUN 15 09/09/2014 2336   BUN 14 07/21/2013 1023   CREATININE 0.6 09/14/2014 0820   CREATININE 0.43* 09/09/2014 2336   CREATININE 0.54 10/04/2012  1123      Component Value Date/Time   CALCIUM 8.5 09/14/2014 0820   CALCIUM 8.2* 09/09/2014 2336   ALKPHOS 67 09/14/2014 0820   ALKPHOS 70 09/09/2014 2336   AST 20 09/14/2014 0820   AST 19 09/09/2014 2336   ALT 23 09/14/2014 0820   ALT 26 09/09/2014 2336   BILITOT 0.22 09/14/2014 0820   BILITOT 0.8 09/09/2014 2336       Lab Results  Component Value Date   WBC 5.3 09/18/2014   HGB 11.7 09/18/2014   HCT 35.6 09/18/2014   MCV 92.4 09/18/2014   PLT 139* 09/18/2014   NEUTROABS 3.7 09/18/2014     RADIOGRAPHIC STUDIES: I have personally reviewed the radiology reports and agreed with their findings. 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 Adjuvant chemotherapy with dose dense Adriamycin and Cytoxan x 4 followed by Abraxane weekly 12 because of lymph node positive disease. 2. Followed by adjuvant radiation 3. Followed by antiestrogen therapy  Current Treatment: Today is cycle 2 day 1 dose dense Adriamycin and Cytoxan Chemotoxicities: 1. Hospitalization for neutropenic fever after cycle 1: Blood cultures have all been negative as well as chest x-ray. She was given antibiotics and discharged home. 2. Fatigue I will reduce the dosage of chemotherapy for cycle 2. 3. Thrombocytopenia: Improving 4. Fevers 09/18/2014: We will get a chest x-ray, UA, blood cultures and CBC CMP today. She is totally asymptomatic. If no evidence of infection is found, I will proceed with today's chemotherapy plan. Her temperature readings have been all over the  place from minute to minute her temperature has been fluctuating between 98.7 to 100.7. Clinically patient appears to be doing extremely well. So we are going to give her chemotherapy today.  Return to clinic in 2 weeks for cycle 3  No orders of the defined types were placed in this encounter.   The patient has a good understanding of the overall plan. she agrees with it. she will call with any problems that may develop before the next visit here.   Rulon Eisenmenger, MD

## 2014-09-18 NOTE — Patient Instructions (Signed)

## 2014-09-18 NOTE — Telephone Encounter (Signed)
Gave avs & calendar for July °

## 2014-09-18 NOTE — Patient Instructions (Signed)
[facility] Discharge Instructions for Patients Receiving Chemotherapy  Today you received the following chemotherapy agents: Adriamycin/Cytoxan.  To help prevent nausea and vomiting after your treatment, we encourage you to take your nausea medication as directed.    If you develop nausea and vomiting that is not controlled by your nausea medication, call the clinic.   BELOW ARE SYMPTOMS THAT SHOULD BE REPORTED IMMEDIATELY:  *FEVER GREATER THAN 100.5 F  *CHILLS WITH OR WITHOUT FEVER  NAUSEA AND VOMITING THAT IS NOT CONTROLLED WITH YOUR NAUSEA MEDICATION  *UNUSUAL SHORTNESS OF BREATH  *UNUSUAL BRUISING OR BLEEDING  TENDERNESS IN MOUTH AND THROAT WITH OR WITHOUT PRESENCE OF ULCERS  *URINARY PROBLEMS  *BOWEL PROBLEMS  UNUSUAL RASH Items with * indicate a potential emergency and should be followed up as soon as possible.  Feel free to call the clinic you have any questions or concerns. The clinic phone number is [phone].  Please show the CHEMO ALERT CARD at check-in to the Emergency Department and triage nurse.   

## 2014-09-19 ENCOUNTER — Other Ambulatory Visit: Payer: Self-pay

## 2014-09-19 LAB — URINE CULTURE

## 2014-09-19 MED ORDER — LEVOFLOXACIN 750 MG PO TABS: 750.0000 mg | ORAL_TABLET | Freq: Every day | ORAL | Status: DC

## 2014-09-24 LAB — CULTURE, BLOOD (SINGLE)

## 2014-09-25 ENCOUNTER — Other Ambulatory Visit (HOSPITAL_BASED_OUTPATIENT_CLINIC_OR_DEPARTMENT_OTHER): Payer: PRIVATE HEALTH INSURANCE

## 2014-09-25 ENCOUNTER — Telehealth: Payer: Self-pay

## 2014-09-25 DIAGNOSIS — C50412 Malignant neoplasm of upper-outer quadrant of left female breast: Secondary | ICD-10-CM | POA: Diagnosis not present

## 2014-09-25 LAB — CBC WITH DIFFERENTIAL/PLATELET
HEMATOCRIT: 30.3 % — AB (ref 34.8–46.6)
HEMOGLOBIN: 9.7 g/dL — AB (ref 11.6–15.9)
MCH: 30.1 pg (ref 25.1–34.0)
MCHC: 32 g/dL (ref 31.5–36.0)
MCV: 94.1 fL (ref 79.5–101.0)
Platelets: 153 10*3/uL (ref 145–400)
RBC: 3.22 10*6/uL — AB (ref 3.70–5.45)
RDW: 14.1 % (ref 11.2–14.5)

## 2014-09-25 LAB — COMPREHENSIVE METABOLIC PANEL (CC13)
ALBUMIN: 3.2 g/dL — AB (ref 3.5–5.0)
ALT: 25 U/L (ref 0–55)
AST: 13 U/L (ref 5–34)
Alkaline Phosphatase: 65 U/L (ref 40–150)
Anion Gap: 9 mEq/L (ref 3–11)
BUN: 17.3 mg/dL (ref 7.0–26.0)
CALCIUM: 8.2 mg/dL — AB (ref 8.4–10.4)
CO2: 25 mEq/L (ref 22–29)
Chloride: 106 mEq/L (ref 98–109)
Creatinine: 0.5 mg/dL — ABNORMAL LOW (ref 0.6–1.1)
EGFR: >90 mL/min/{1.73_m2} (ref >90)
Glucose: 103 mg/dl (ref 70–140)
POTASSIUM: 3.4 meq/L — AB (ref 3.5–5.1)
Sodium: 140 mEq/L (ref 136–145)
TOTAL PROTEIN: 5.4 g/dL — AB (ref 6.4–8.3)
Total Bilirubin: 0.27 mg/dL (ref 0.20–1.20)

## 2014-09-25 LAB — TECHNOLOGIST REVIEW

## 2014-09-25 NOTE — Telephone Encounter (Signed)
Returned patient call re: lab results.  Provided lab results to patient, informed her WBC of 0.2 with normal range of 5-10.  Advised patient infection prevention - mask, avoid crowns, washing hands, any sign of temperature go immediately to ED, consider starting antibiotic.  Pt requested to speak with Dr. Lindi Adie - let her know I would tell him.  Pt voiced understanding.

## 2014-10-02 ENCOUNTER — Telehealth: Payer: Self-pay | Admitting: Hematology and Oncology

## 2014-10-02 ENCOUNTER — Other Ambulatory Visit (HOSPITAL_BASED_OUTPATIENT_CLINIC_OR_DEPARTMENT_OTHER): Payer: PRIVATE HEALTH INSURANCE

## 2014-10-02 ENCOUNTER — Ambulatory Visit: Payer: PRIVATE HEALTH INSURANCE

## 2014-10-02 ENCOUNTER — Encounter: Payer: Self-pay | Admitting: Hematology and Oncology

## 2014-10-02 ENCOUNTER — Ambulatory Visit (HOSPITAL_BASED_OUTPATIENT_CLINIC_OR_DEPARTMENT_OTHER): Payer: PRIVATE HEALTH INSURANCE | Admitting: Hematology and Oncology

## 2014-10-02 VITALS — BP 105/63 | HR 93 | Temp 98.4°F | Resp 18 | Ht 65.0 in | Wt 144.4 lb

## 2014-10-02 DIAGNOSIS — Z170 Estrogen receptor positive status [ER+]: Secondary | ICD-10-CM | POA: Diagnosis not present

## 2014-10-02 DIAGNOSIS — D6481 Anemia due to antineoplastic chemotherapy: Secondary | ICD-10-CM

## 2014-10-02 DIAGNOSIS — C50412 Malignant neoplasm of upper-outer quadrant of left female breast: Secondary | ICD-10-CM

## 2014-10-02 DIAGNOSIS — C773 Secondary and unspecified malignant neoplasm of axilla and upper limb lymph nodes: Secondary | ICD-10-CM

## 2014-10-02 DIAGNOSIS — D696 Thrombocytopenia, unspecified: Secondary | ICD-10-CM | POA: Diagnosis not present

## 2014-10-02 DIAGNOSIS — R5383 Other fatigue: Secondary | ICD-10-CM

## 2014-10-02 LAB — CBC WITH DIFFERENTIAL/PLATELET
BASO%: 1.3 % (ref 0.0–2.0)
Basophils Absolute: 0.2 10*3/uL — ABNORMAL HIGH (ref 0.0–0.1)
EOS ABS: 0 10*3/uL (ref 0.0–0.5)
EOS%: 0 % (ref 0.0–7.0)
HCT: 30 % — ABNORMAL LOW (ref 34.8–46.6)
HGB: 9.7 g/dL — ABNORMAL LOW (ref 11.6–15.9)
LYMPH%: 12.2 % — ABNORMAL LOW (ref 14.0–49.7)
MCH: 30.6 pg (ref 25.1–34.0)
MCHC: 32.3 g/dL (ref 31.5–36.0)
MCV: 94.6 fL (ref 79.5–101.0)
MONO#: 1.5 10*3/uL — ABNORMAL HIGH (ref 0.1–0.9)
MONO%: 11.1 % (ref 0.0–14.0)
NEUT%: 75.4 % (ref 38.4–76.8)
NEUTROS ABS: 10 10*3/uL — AB (ref 1.5–6.5)
Platelets: 61 10*3/uL — ABNORMAL LOW (ref 145–400)
RBC: 3.17 10*6/uL — AB (ref 3.70–5.45)
RDW: 15.1 % — ABNORMAL HIGH (ref 11.2–14.5)
WBC: 13.2 10*3/uL — ABNORMAL HIGH (ref 3.9–10.3)
lymph#: 1.6 10*3/uL (ref 0.9–3.3)

## 2014-10-02 LAB — COMPREHENSIVE METABOLIC PANEL (CC13)
ALT: 48 U/L (ref 0–55)
AST: 51 U/L — AB (ref 5–34)
Albumin: 3 g/dL — ABNORMAL LOW (ref 3.5–5.0)
Alkaline Phosphatase: 99 U/L (ref 40–150)
Anion Gap: 10 mEq/L (ref 3–11)
BUN: 10.9 mg/dL (ref 7.0–26.0)
CHLORIDE: 110 meq/L — AB (ref 98–109)
CO2: 20 mEq/L — ABNORMAL LOW (ref 22–29)
Calcium: 7.7 mg/dL — ABNORMAL LOW (ref 8.4–10.4)
Creatinine: 0.6 mg/dL (ref 0.6–1.1)
EGFR: >90 mL/min/{1.73_m2} (ref >90)
Glucose: 123 mg/dl (ref 70–140)
Potassium: 3.5 mEq/L (ref 3.5–5.1)
SODIUM: 140 meq/L (ref 136–145)
TOTAL PROTEIN: 5.5 g/dL — AB (ref 6.4–8.3)
Total Bilirubin: 0.32 mg/dL (ref 0.20–1.20)

## 2014-10-02 MED ORDER — LEVOFLOXACIN 500 MG PO TABS: 500.0000 mg | ORAL_TABLET | Freq: Every day | ORAL | Status: DC

## 2014-10-02 MED ORDER — LORAZEPAM 0.5 MG PO TABS: 0.5000 mg | ORAL_TABLET | Freq: Every day | ORAL | Status: DC

## 2014-10-02 NOTE — Assessment & Plan Note (Signed)
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 Adjuvant chemotherapy with dose dense Adriamycin and Cytoxan x 4 followed by Abraxane weekly 12 because of lymph node positive disease. 2. Followed by adjuvant radiation 3. Followed by antiestrogen therapy  Current Treatment: Today is cycle 3 day 1 dose dense Adriamycin and Cytoxan Chemotoxicities: 1. Hospitalization for neutropenic fever after cycle 1: Blood cultures have all been negative as well as chest x-ray. She was given antibiotics and discharged home. 2. Fatigue I will reduce the dosage of chemotherapy for cycle 2. 3. Thrombocytopenia: Improving 4. Fevers 09/18/2014: W/U negative for source of infection  Return to clinic in 2 weeks for cycle 3

## 2014-10-02 NOTE — Progress Notes (Signed)
Patient Care Team: Chipper Herb, MD as PCP - General (Family Medicine)  DIAGNOSIS: No matching staging information was found for the patient.  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, with LCIS, medial and inferior margins positive, 3/4 lymph nodes positive T2 N1 cM0 stage IIB   08/16/2014 Surgery Left breast medial margin reexcision residual ILC 0.2 cm; inferior margin residual foci less than 0.2 cm, 1/5 lymph nodes positive, 1 lymph node with isolated tumor cells (Overall 4/10)   09/04/2014 -  Chemotherapy Adjuvant chemotherapy with dose dense Adriamycin and Cytoxan 4 followed by Abraxane weekly 12    CHIEF COMPLIANT: Cycle 3 dose dense Adriamycin and Cytoxan  INTERVAL HISTORY: [name] is a 54 year old with above-mentioned history of left breast cancer currently on adjuvant chemotherapy with dose dense Adriamycin and Cytoxan. She is here today for cycle 3 of chemotherapy. She has felt intermittently fatigued as well as intermittent fevers. She denies any nausea vomiting. Feels tired.  REVIEW OF SYSTEMS:   Constitutional: Denies fevers, chills or abnormal weight loss, appears slightly pale Eyes: Denies blurriness of vision Ears, nose, mouth, throat, and face: Denies mucositis or sore throat Respiratory: Denies cough, dyspnea or wheezes Cardiovascular: Denies palpitation, chest discomfort or lower extremity swelling Gastrointestinal:  Denies nausea, heartburn or  change in bowel habits Skin: Denies abnormal skin rashes Lymphatics: Denies new lymphadenopathy or easy bruising Neurological:Denies numbness, tingling or new weaknesses Behavioral/Psych: Mood is stable, no new changes  All other systems were reviewed with the patient and are negative.  I have reviewed the past medical history, past surgical history, social history and family history with the patient and they are unchanged from previous note.  ALLERGIES:  is allergic to crestor; lipitor; and temazepam.  MEDICATIONS:  Current Outpatient Prescriptions  Medication Sig Dispense Refill  . acetaminophen (TYLENOL) 325 MG tablet Take 650 mg by mouth every 6 (six) hours as needed for moderate pain.    Marland Kitchen amoxicillin-clavulanate (AUGMENTIN) 875-125 MG per tablet Take 1 tablet by mouth 2 (two) times daily. 12 tablet 0  . clonazePAM (KLONOPIN) 0.5 MG tablet TAKE 1 TABLET THREE TIMES A DAY 90 tablet 2  . dexamethasone (DECADRON) 4 MG tablet Take 1 tablets by mouth once a day on the day after chemotherapy and then take 1 tablets two times a day for 2 days. Take with food. (Patient taking differently: Take 4 mg by mouth 2 (two) times daily as needed (nausea). Take 1 tablets by mouth once a day on the day after chemotherapy and then take 1 tablets two times a day for 2 days. Take with food.) 30 tablet 1  . hydroxypropyl methylcellulose / hypromellose (ISOPTO TEARS / GONIOVISC) 2.5 % ophthalmic solution Place 1 drop into both eyes 3 (three) times daily as needed for dry eyes.    Marland Kitchen lidocaine-prilocaine (EMLA) cream Apply to affected area once 30 g 3  . LORazepam (ATIVAN) 0.5 MG tablet Take 1 tablet (0.5  mg total) by mouth at bedtime. 30 tablet 0  . ondansetron (ZOFRAN) 8 MG tablet Take 1 tablet (8 mg total) by mouth 2 (two) times daily as needed for nausea. Start on the third day after chemotherapy. 30 tablet 1  . PARoxetine (PAXIL-CR) 25 MG 24 hr tablet TAKE 1 TABLET BY MOUTH EVERY DAY 90 tablet 2  .  prochlorperazine (COMPAZINE) 10 MG tablet Take 1 tablet (10 mg total) by mouth every 6 (six) hours as needed (Nausea or vomiting). 30 tablet 1  . levofloxacin (LEVAQUIN) 500 MG tablet Take 1 tablet (500 mg total) by mouth daily. 14 tablet 0   No current facility-administered medications for this visit.    PHYSICAL EXAMINATION: ECOG PERFORMANCE STATUS: 1 - Symptomatic but completely ambulatory  Filed Vitals:   10/02/14 0823  BP: 105/63  Pulse: 93  Temp: 98.4 F (36.9 C)  Resp: 18   Filed Weights   10/02/14 0823  Weight: 144 lb 7 oz (65.516 kg)    GENERAL:alert, no distress and comfortable SKIN: skin color, texture, turgor are normal, no rashes or significant lesions EYES: normal, Conjunctiva are pink and non-injected, sclera clear OROPHARYNX:no exudate, no erythema and lips, buccal mucosa, and tongue normal  NECK: supple, thyroid normal size, non-tender, without nodularity LYMPH:  no palpable lymphadenopathy in the cervical, axillary or inguinal LUNGS: clear to auscultation and percussion with normal breathing effort HEART: regular rate & rhythm and no murmurs and no lower extremity edema ABDOMEN:abdomen soft, non-tender and normal bowel sounds Musculoskeletal:no cyanosis of digits and no clubbing  NEURO: alert & oriented x 3 with fluent speech, no focal motor/sensory deficits   LABORATORY DATA:  I have reviewed the data as listed   Chemistry      Component Value Date/Time   NA 140 09/25/2014 0817   NA 132* 09/09/2014 2336   NA 141 07/21/2013 1023   K 3.4* 09/25/2014 0817   K 3.6 09/09/2014 2336   CL 99* 09/09/2014 2336   CO2 25 09/25/2014 0817   CO2 25 09/09/2014 2336   BUN 17.3 09/25/2014 0817   BUN 15 09/09/2014 2336   BUN 14 07/21/2013 1023   CREATININE 0.5* 09/25/2014 0817   CREATININE 0.43* 09/09/2014 2336   CREATININE 0.54 10/04/2012 1123      Component Value Date/Time   CALCIUM 8.2* 09/25/2014 0817   CALCIUM 8.2* 09/09/2014 2336   ALKPHOS 65 09/25/2014  0817   ALKPHOS 70 09/09/2014 2336   AST 13 09/25/2014 0817   AST 19 09/09/2014 2336   ALT 25 09/25/2014 0817   ALT 26 09/09/2014 2336   BILITOT 0.27 09/25/2014 0817   BILITOT 0.8 09/09/2014 2336       Lab Results  Component Value Date   WBC 13.2* 10/02/2014   HGB 9.7* 10/02/2014   HCT 30.0* 10/02/2014   MCV 94.6 10/02/2014   PLT 61* 10/02/2014   NEUTROABS 10.0* 10/02/2014   ASSESSMENT & PLAN:  Breast cancer of upper-outer quadrant of left female breast Left breast lumpectomy 08/08/2014: 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 Adjuvant chemotherapy with dose dense Adriamycin and Cytoxan x 4 followed by Abraxane weekly 12  because of lymph node positive disease. 2. Followed by adjuvant radiation 3. Followed by antiestrogen therapy  Current Treatment: Today is cycle 3 day 1 dose dense Adriamycin and Cytoxan (holding chemotherapy for thrombocytopenia)  Chemotoxicities: 1. Hospitalization for neutropenic fever after cycle 1: Blood cultures have all been negative as well as chest x-ray. She was given antibiotics and discharged home. 2. Fatigue I will reduce the dosage of chemotherapy for cycle 2. 3. Thrombocytopenia: Platelets 61 10/02/2014: Holding chemotherapy, will decrease the dosage for cycle 3 and postponing it till 10/09/2014 4. Fevers 09/18/2014: W/U negative for source of infection 5. Anemia due to chemotherapy hemoglobin 9.7 10/02/2014 stable, monitoring.  Return to clinic in 1 weeks for cycle 3   No orders of the defined types were placed in this encounter.   The patient has a good understanding of the  overall plan. she agrees with it. she will call with any problems that may develop before the next visit here.   Rulon Eisenmenger, MD

## 2014-10-02 NOTE — Telephone Encounter (Signed)
Appointments made and avs printed for patient °

## 2014-10-04 NOTE — Telephone Encounter (Signed)
Results

## 2014-10-09 ENCOUNTER — Ambulatory Visit (HOSPITAL_BASED_OUTPATIENT_CLINIC_OR_DEPARTMENT_OTHER): Payer: PRIVATE HEALTH INSURANCE | Admitting: Hematology and Oncology

## 2014-10-09 ENCOUNTER — Other Ambulatory Visit (HOSPITAL_BASED_OUTPATIENT_CLINIC_OR_DEPARTMENT_OTHER): Payer: PRIVATE HEALTH INSURANCE

## 2014-10-09 ENCOUNTER — Encounter: Payer: Self-pay | Admitting: *Deleted

## 2014-10-09 ENCOUNTER — Encounter: Payer: Self-pay | Admitting: Hematology and Oncology

## 2014-10-09 ENCOUNTER — Ambulatory Visit (HOSPITAL_BASED_OUTPATIENT_CLINIC_OR_DEPARTMENT_OTHER): Payer: PRIVATE HEALTH INSURANCE

## 2014-10-09 ENCOUNTER — Telehealth: Payer: Self-pay | Admitting: Hematology and Oncology

## 2014-10-09 VITALS — BP 100/58 | HR 104 | Temp 99.0°F | Resp 18 | Ht 65.0 in | Wt 142.0 lb

## 2014-10-09 DIAGNOSIS — Z5111 Encounter for antineoplastic chemotherapy: Secondary | ICD-10-CM

## 2014-10-09 DIAGNOSIS — Z5189 Encounter for other specified aftercare: Secondary | ICD-10-CM | POA: Diagnosis not present

## 2014-10-09 DIAGNOSIS — D701 Agranulocytosis secondary to cancer chemotherapy: Secondary | ICD-10-CM

## 2014-10-09 DIAGNOSIS — C50412 Malignant neoplasm of upper-outer quadrant of left female breast: Secondary | ICD-10-CM | POA: Diagnosis not present

## 2014-10-09 LAB — CBC WITH DIFFERENTIAL/PLATELET
BASO%: 0.9 % (ref 0.0–2.0)
BASO%: 2.7 % — ABNORMAL HIGH (ref 0.0–2.0)
BASOS ABS: 0 10*3/uL (ref 0.0–0.1)
Basophils Absolute: 0.1 10*3/uL (ref 0.0–0.1)
EOS ABS: 0 10*3/uL (ref 0.0–0.5)
EOS%: 0 % (ref 0.0–7.0)
EOS%: 0 % (ref 0.0–7.0)
Eosinophils Absolute: 0 10*3/uL (ref 0.0–0.5)
HCT: 31.1 % — ABNORMAL LOW (ref 34.8–46.6)
HEMATOCRIT: 32.4 % — AB (ref 34.8–46.6)
HGB: 10.5 g/dL — ABNORMAL LOW (ref 11.6–15.9)
HGB: 10.3 g/dL — ABNORMAL LOW (ref 11.6–15.9)
LYMPH#: 3.2 10*3/uL (ref 0.9–3.3)
LYMPH%: 63.3 % — ABNORMAL HIGH (ref 14.0–49.7)
LYMPH%: 64.3 % — ABNORMAL HIGH (ref 14.0–49.7)
MCH: 30 pg (ref 25.1–34.0)
MCH: 30.7 pg (ref 25.1–34.0)
MCHC: 32.6 g/dL (ref 31.5–36.0)
MCHC: 33.1 g/dL (ref 31.5–36.0)
MCV: 92.2 fL (ref 79.5–101.0)
MCV: 92.8 fL (ref 79.5–101.0)
MONO#: 0.7 10*3/uL (ref 0.1–0.9)
MONO#: 0.9 10*3/uL (ref 0.1–0.9)
MONO%: 14.4 % — ABNORMAL HIGH (ref 0.0–14.0)
MONO%: 16.5 % — ABNORMAL HIGH (ref 0.0–14.0)
NEUT%: 21.4 % — AB (ref 38.4–76.8)
NEUT%: 16.5 % — ABNORMAL LOW (ref 38.4–76.8)
NEUTROS ABS: 1.1 10*3/uL — AB (ref 1.5–6.5)
NEUTROS ABS: 0.9 10*3/uL — AB (ref 1.5–6.5)
NRBC: 0 % (ref 0–0)
PLATELETS: 309 10*3/uL (ref 145–400)
Platelets: 296 10*3/uL (ref 145–400)
RBC: 3.51 10*6/uL — ABNORMAL LOW (ref 3.70–5.45)
RBC: 3.35 10*6/uL — ABNORMAL LOW (ref 3.70–5.45)
RDW: 16.5 % — AB (ref 11.2–14.5)
RDW: 17 % — AB (ref 11.2–14.5)
WBC: 5.1 10*3/uL (ref 3.9–10.3)
WBC: 5.2 10*3/uL (ref 3.9–10.3)
lymph#: 3.4 10*3/uL — ABNORMAL HIGH (ref 0.9–3.3)

## 2014-10-09 LAB — COMPREHENSIVE METABOLIC PANEL (CC13)
ALK PHOS: 110 U/L (ref 40–150)
ALT: 47 U/L (ref 0–55)
AST: 52 U/L — ABNORMAL HIGH (ref 5–34)
Albumin: 3.1 g/dL — ABNORMAL LOW (ref 3.5–5.0)
Anion Gap: 7 mEq/L (ref 3–11)
BUN: 9.3 mg/dL (ref 7.0–26.0)
CHLORIDE: 110 meq/L — AB (ref 98–109)
CO2: 25 meq/L (ref 22–29)
Calcium: 8.3 mg/dL — ABNORMAL LOW (ref 8.4–10.4)
Creatinine: 0.6 mg/dL (ref 0.6–1.1)
GLUCOSE: 125 mg/dL (ref 70–140)
POTASSIUM: 3.5 meq/L (ref 3.5–5.1)
SODIUM: 142 meq/L (ref 136–145)
TOTAL PROTEIN: 5.9 g/dL — AB (ref 6.4–8.3)
Total Bilirubin: 0.34 mg/dL (ref 0.20–1.20)

## 2014-10-09 MED ORDER — PALONOSETRON HCL INJECTION 0.25 MG/5ML
INTRAVENOUS | Status: AC
  Filled 2014-10-09: qty 5

## 2014-10-09 MED ORDER — SODIUM CHLORIDE 0.9 % IJ SOLN
10.0000 mL | INTRAMUSCULAR | Status: DC | PRN
  Administered 2014-10-09: 10 mL
  Filled 2014-10-09: qty 10

## 2014-10-09 MED ORDER — FOSAPREPITANT DIMEGLUMINE INJECTION 150 MG
Freq: Once | INTRAVENOUS | Status: AC
  Administered 2014-10-09: 13:00:00 via INTRAVENOUS
  Filled 2014-10-09: qty 5

## 2014-10-09 MED ORDER — DOXORUBICIN HCL CHEMO IV INJECTION 2 MG/ML
40.0000 mg/m2 | Freq: Once | INTRAVENOUS | Status: AC
  Administered 2014-10-09: 70 mg via INTRAVENOUS
  Filled 2014-10-09: qty 35

## 2014-10-09 MED ORDER — SODIUM CHLORIDE 0.9 % IV SOLN
400.0000 mg/m2 | Freq: Once | INTRAVENOUS | Status: AC
  Administered 2014-10-09: 700 mg via INTRAVENOUS
  Filled 2014-10-09: qty 35

## 2014-10-09 MED ORDER — SODIUM CHLORIDE 0.9 % IV SOLN
Freq: Once | INTRAVENOUS | Status: AC
  Administered 2014-10-09: 13:00:00 via INTRAVENOUS

## 2014-10-09 MED ORDER — PEGFILGRASTIM 6 MG/0.6ML ~~LOC~~ PSKT
6.0000 mg | PREFILLED_SYRINGE | Freq: Once | SUBCUTANEOUS | Status: AC
  Administered 2014-10-09: 6 mg via SUBCUTANEOUS
  Filled 2014-10-09: qty 0.6

## 2014-10-09 MED ORDER — PALONOSETRON HCL INJECTION 0.25 MG/5ML
0.2500 mg | Freq: Once | INTRAVENOUS | Status: AC
  Administered 2014-10-09: 0.25 mg via INTRAVENOUS

## 2014-10-09 MED ORDER — HEPARIN SOD (PORK) LOCK FLUSH 100 UNIT/ML IV SOLN
500.0000 [IU] | Freq: Once | INTRAVENOUS | Status: AC | PRN
  Administered 2014-10-09: 500 [IU]
  Filled 2014-10-09: qty 5

## 2014-10-09 NOTE — Patient Instructions (Signed)
[facility] Discharge Instructions for Patients Receiving Chemotherapy  Today you received the following chemotherapy agents: Adriamycin/Cytoxan.  To help prevent nausea and vomiting after your treatment, we encourage you to take your nausea medication as directed.    If you develop nausea and vomiting that is not controlled by your nausea medication, call the clinic.   BELOW ARE SYMPTOMS THAT SHOULD BE REPORTED IMMEDIATELY:  *FEVER GREATER THAN 100.5 F  *CHILLS WITH OR WITHOUT FEVER  NAUSEA AND VOMITING THAT IS NOT CONTROLLED WITH YOUR NAUSEA MEDICATION  *UNUSUAL SHORTNESS OF BREATH  *UNUSUAL BRUISING OR BLEEDING  TENDERNESS IN MOUTH AND THROAT WITH OR WITHOUT PRESENCE OF ULCERS  *URINARY PROBLEMS  *BOWEL PROBLEMS  UNUSUAL RASH Items with * indicate a potential emergency and should be followed up as soon as possible.  Feel free to call the clinic you have any questions or concerns. The clinic phone number is [phone].  Please show the CHEMO ALERT CARD at check-in to the Emergency Department and triage nurse.   

## 2014-10-09 NOTE — Progress Notes (Signed)
Met with pt during chemo. Pt with some anxiety d/t her "counts" and not receiving chemo as scheduled. Gave pt emotional support and education and reasoning for holding chemo when her counts were too low. Received verbal understanding. Encourage pt to call with further questions or concerns.

## 2014-10-09 NOTE — Progress Notes (Signed)
Patient Care Team: Chipper Herb, MD as PCP - General (Family Medicine)  DIAGNOSIS: No matching staging information was found for the patient.  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, with LCIS, medial and inferior margins positive, 3/4 lymph nodes positive T2 N1 cM0 stage IIB   08/16/2014 Surgery Left breast medial margin reexcision residual ILC 0.2 cm; inferior margin residual foci less than 0.2 cm, 1/5 lymph nodes positive, 1 lymph node with isolated tumor cells (Overall 4/10)   09/04/2014 -  Chemotherapy Adjuvant chemotherapy with dose dense Adriamycin and Cytoxan 4 followed by Abraxane weekly 12    CHIEF COMPLIANT: cycle 3 dose dense Adriamycin and Cytoxan (delayed for thrombocytopenia)  INTERVAL HISTORY: [name] is a 54 year old with above-mentioned history of left breast cancer currently on adjuvant chemotherapy with dose dense Adriamycin and Cytoxan. Today is cycle #3. It was delayed from last week because of platelet count of 61. Today her absolute neutrophil count is 1100. This is being surprisingly lower than last week's ANC of 10,000. We have not treated her over the past week and so we would like her to repeat CBC again today. She tells me that she has been very upset over the whole of last week because of the delay in chemotherapy. She is been mostly resting and feels tired overall.  REVIEW OF SYSTEMS:   Constitutional: Denies fevers, chills or abnormal  weight loss Eyes: Denies blurriness of vision Ears, nose, mouth, throat, and face: Denies mucositis or sore throat Respiratory: Denies cough, dyspnea or wheezes Cardiovascular: Denies palpitation, chest discomfort or lower extremity swelling Gastrointestinal:  Denies nausea, heartburn or change in bowel habits Skin: Denies abnormal skin rashes Lymphatics: Denies new lymphadenopathy or easy bruising Neurological:Denies numbness, tingling or new weaknesses Behavioral/Psych: Mood is stable, no new changes  All other systems were reviewed with the patient and are negative.  I have reviewed the past medical history, past surgical history, social history and family history with the patient and they are unchanged from previous note.  ALLERGIES:  is allergic to crestor; lipitor; and temazepam.  MEDICATIONS:  Current Outpatient Prescriptions  Medication Sig Dispense Refill  . acetaminophen (TYLENOL) 325 MG tablet Take 650 mg by mouth every 6 (six) hours as needed for moderate pain.    Marland Kitchen amoxicillin-clavulanate (AUGMENTIN) 875-125 MG per tablet Take 1 tablet by mouth 2 (two) times daily. 12 tablet 0  . clonazePAM (KLONOPIN) 0.5 MG tablet TAKE 1 TABLET THREE TIMES A DAY 90 tablet 2  . dexamethasone (DECADRON) 4 MG tablet Take 1 tablets by mouth once a day on the day after chemotherapy and then take 1 tablets two times a day for 2 days. Take with food. (Patient taking differently: Take 4 mg by mouth 2 (two) times daily as needed (nausea). Take 1 tablets by mouth once a day on the day after chemotherapy and then take 1 tablets two times a day for 2 days. Take with food.)  30 tablet 1  . hydroxypropyl methylcellulose / hypromellose (ISOPTO TEARS / GONIOVISC) 2.5 % ophthalmic solution Place 1 drop into both eyes 3 (three) times daily as needed for dry eyes.    Marland Kitchen levofloxacin (LEVAQUIN) 500 MG tablet Take 1 tablet (500 mg total) by mouth daily. 14 tablet 0  . lidocaine-prilocaine (EMLA) cream Apply to  affected area once 30 g 3  . LORazepam (ATIVAN) 0.5 MG tablet Take 1 tablet (0.5 mg total) by mouth at bedtime. 30 tablet 0  . ondansetron (ZOFRAN) 8 MG tablet Take 1 tablet (8 mg total) by mouth 2 (two) times daily as needed for nausea. Start on the third day after chemotherapy. 30 tablet 1  . PARoxetine (PAXIL-CR) 25 MG 24 hr tablet TAKE 1 TABLET BY MOUTH EVERY DAY 90 tablet 2  . prochlorperazine (COMPAZINE) 10 MG tablet Take 1 tablet (10 mg total) by mouth every 6 (six) hours as needed (Nausea or vomiting). 30 tablet 1   No current facility-administered medications for this visit.    PHYSICAL EXAMINATION: ECOG PERFORMANCE STATUS: 1 - Symptomatic but completely ambulatory  Filed Vitals:   10/09/14 1112  BP: 100/58  Pulse: 104  Temp: 99 F (37.2 C)  Resp: 18   Filed Weights   10/09/14 1112  Weight: 142 lb (64.411 kg)    GENERAL:alert, no distress and comfortable SKIN: skin color, texture, turgor are normal, no rashes or significant lesions EYES: normal, Conjunctiva are pink and non-injected, sclera clear OROPHARYNX:no exudate, no erythema and lips, buccal mucosa, and tongue normal  NECK: supple, thyroid normal size, non-tender, without nodularity LYMPH:  no palpable lymphadenopathy in the cervical, axillary or inguinal LUNGS: clear to auscultation and percussion with normal breathing effort HEART: regular rate & rhythm and no murmurs and no lower extremity edema ABDOMEN:abdomen soft, non-tender and normal bowel sounds Musculoskeletal:no cyanosis of digits and no clubbing  NEURO: alert & oriented x 3 with fluent speech, no focal motor/sensory deficits  LABORATORY DATA:  I have reviewed the data as listed   Chemistry      Component Value Date/Time   NA 142 10/09/2014 1055   NA 132* 09/09/2014 2336   NA 141 07/21/2013 1023   K 3.5 10/09/2014 1055   K 3.6 09/09/2014 2336   CL 99* 09/09/2014 2336   CO2 25 10/09/2014 1055   CO2 25 09/09/2014 2336   BUN 9.3 10/09/2014 1055    BUN 15 09/09/2014 2336   BUN 14 07/21/2013 1023   CREATININE 0.6 10/09/2014 1055   CREATININE 0.43* 09/09/2014 2336   CREATININE 0.54 10/04/2012 1123      Component Value Date/Time   CALCIUM 8.3* 10/09/2014 1055   CALCIUM 8.2* 09/09/2014 2336   ALKPHOS 110 10/09/2014 1055   ALKPHOS 70 09/09/2014 2336   AST 52* 10/09/2014 1055   AST 19 09/09/2014 2336   ALT 47 10/09/2014 1055   ALT 26 09/09/2014 2336   BILITOT 0.34 10/09/2014 1055   BILITOT 0.8 09/09/2014 2336       Lab Results  Component Value Date   WBC 5.1 10/09/2014   HGB 10.5* 10/09/2014   HCT 32.4* 10/09/2014   MCV 92.2 10/09/2014   PLT 309 10/09/2014   NEUTROABS 1.1* 10/09/2014   ASSESSMENT & PLAN:  Breast cancer of upper-outer quadrant of left female breast Left breast lumpectomy 08/08/2014: 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 Adjuvant chemotherapy with dose dense Adriamycin and Cytoxan x 4 followed by Abraxane weekly 12 because of lymph node positive disease. 2. Followed by adjuvant radiation 3. Followed by antiestrogen therapy  Current Treatment: Today is cycle 3 day 1 dose dense Adriamycin and Cytoxan (Prev held chemotherapy for thrombocytopenia)  Chemotoxicities: 1. Hospitalization for neutropenic fever after cycle 1: Blood cultures have all been negative as well as chest x-ray. She was given antibiotics and discharged home. 2. Fatigue: Reduced the dosage of chemotherapy for cycle 2 and further with cycle 3. 3. Thrombocytopenia: Platelets 61 10/02/2014: Held chemotherapy, dose reduction with cycle 3 4. Fevers 09/18/2014: W/U negative for  source of infection, patient has persistent fevers as high as 102.8 and they get better with Tylenol. 5. Anemia due to chemotherapy hemoglobin stable, monitoring closely. 6. Neutropenia: Related to chemotherapy. This is surprising since we had not given her chemotherapy since last week. Recheck of ANC is 0.9. We have decided after much discussion to go ahead with treatment today. She will have a lower dosage of chemotherapy. I would like her to come back in one week for recheck of her blood counts and follow-up. The only possibility for the lowering of ANC could be the current usage of antibiotics.  Return to clinic in 1 weeks for toxicity check.  No orders of the defined types were placed in this encounter.   The patient has a good understanding of the overall plan. she agrees with it. she will call with any problems that may develop before the next visit here.   Rulon Eisenmenger, MD   A

## 2014-10-09 NOTE — Progress Notes (Signed)
Per Dr. Lindi Adie, ok to treat with ANC 0.9 (recheck), initially 1.1.  Dr. Lindi Adie plans to dose reduce.

## 2014-10-09 NOTE — Assessment & Plan Note (Signed)
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 Adjuvant chemotherapy with dose dense Adriamycin and Cytoxan x 4 followed by Abraxane weekly 12 because of lymph node positive disease. 2. Followed by adjuvant radiation 3. Followed by antiestrogen therapy  Current Treatment: Today is cycle 4 day 1 dose dense Adriamycin and Cytoxan (holding chemotherapy for thrombocytopenia)  Chemotoxicities: 1. Hospitalization for neutropenic fever after cycle 1: Blood cultures have all been negative as well as chest x-ray. She was given antibiotics and discharged home. 2. Fatigue I will reduce the dosage of chemotherapy for cycle 2. 3. Thrombocytopenia: Platelets 61 10/02/2014: Holding chemotherapy, will decrease the dosage for cycle 3 and postponing it till 10/09/2014 4. Fevers 09/18/2014: W/U negative for source of infection 5. Anemia due to chemotherapy hemoglobin 9.7 10/02/2014 stable, monitoring.  Return to clinic in 2 weeks for cycle 1/12 Abraxane

## 2014-10-09 NOTE — Telephone Encounter (Signed)
Gave avs & calendar for August °

## 2014-10-16 ENCOUNTER — Other Ambulatory Visit (HOSPITAL_BASED_OUTPATIENT_CLINIC_OR_DEPARTMENT_OTHER): Payer: PRIVATE HEALTH INSURANCE

## 2014-10-16 ENCOUNTER — Ambulatory Visit (HOSPITAL_BASED_OUTPATIENT_CLINIC_OR_DEPARTMENT_OTHER): Payer: PRIVATE HEALTH INSURANCE | Admitting: Hematology and Oncology

## 2014-10-16 ENCOUNTER — Telehealth: Payer: Self-pay | Admitting: Hematology and Oncology

## 2014-10-16 ENCOUNTER — Encounter: Payer: Self-pay | Admitting: Hematology and Oncology

## 2014-10-16 VITALS — BP 104/59 | HR 100 | Temp 98.7°F | Resp 18 | Ht 65.0 in | Wt 144.0 lb

## 2014-10-16 DIAGNOSIS — C773 Secondary and unspecified malignant neoplasm of axilla and upper limb lymph nodes: Secondary | ICD-10-CM

## 2014-10-16 DIAGNOSIS — C50412 Malignant neoplasm of upper-outer quadrant of left female breast: Secondary | ICD-10-CM

## 2014-10-16 DIAGNOSIS — D6481 Anemia due to antineoplastic chemotherapy: Secondary | ICD-10-CM

## 2014-10-16 DIAGNOSIS — Z170 Estrogen receptor positive status [ER+]: Secondary | ICD-10-CM

## 2014-10-16 DIAGNOSIS — D701 Agranulocytosis secondary to cancer chemotherapy: Secondary | ICD-10-CM | POA: Diagnosis not present

## 2014-10-16 DIAGNOSIS — R5383 Other fatigue: Secondary | ICD-10-CM

## 2014-10-16 LAB — COMPREHENSIVE METABOLIC PANEL (CC13)
ALBUMIN: 3.1 g/dL — AB (ref 3.5–5.0)
ALT: 38 U/L (ref 0–55)
AST: 22 U/L (ref 5–34)
Alkaline Phosphatase: 79 U/L (ref 40–150)
Anion Gap: 7 mEq/L (ref 3–11)
BUN: 12.3 mg/dL (ref 7.0–26.0)
CHLORIDE: 108 meq/L (ref 98–109)
CO2: 28 mEq/L (ref 22–29)
Calcium: 8 mg/dL — ABNORMAL LOW (ref 8.4–10.4)
Creatinine: 0.5 mg/dL — ABNORMAL LOW (ref 0.6–1.1)
Glucose: 101 mg/dl (ref 70–140)
POTASSIUM: 3.8 meq/L (ref 3.5–5.1)
Sodium: 143 mEq/L (ref 136–145)
Total Bilirubin: 0.34 mg/dL (ref 0.20–1.20)
Total Protein: 5.7 g/dL — ABNORMAL LOW (ref 6.4–8.3)

## 2014-10-16 LAB — CBC WITH DIFFERENTIAL/PLATELET
BASO%: 0 % (ref 0.0–2.0)
Basophils Absolute: 0 10*3/uL (ref 0.0–0.1)
EOS%: 9.5 % — AB (ref 0.0–7.0)
Eosinophils Absolute: 0 10*3/uL (ref 0.0–0.5)
HEMATOCRIT: 27.7 % — AB (ref 34.8–46.6)
HGB: 9.1 g/dL — ABNORMAL LOW (ref 11.6–15.9)
LYMPH%: 76.7 % — AB (ref 14.0–49.7)
MCH: 30 pg (ref 25.1–34.0)
MCHC: 33 g/dL (ref 31.5–36.0)
MCV: 90.9 fL (ref 79.5–101.0)
MONO#: 0 10*3/uL — ABNORMAL LOW (ref 0.1–0.9)
MONO%: 11.8 % (ref 0.0–14.0)
NEUT#: 0 10*3/uL — CL (ref 1.5–6.5)
NEUT%: 2 % — ABNORMAL LOW (ref 38.4–76.8)
Platelets: 81 10*3/uL — ABNORMAL LOW (ref 145–400)
RBC: 3.04 10*6/uL — AB (ref 3.70–5.45)
RDW: 15.5 % — ABNORMAL HIGH (ref 11.2–14.5)
WBC: 0.3 10*3/uL — AB (ref 3.9–10.3)
lymph#: 0.2 10*3/uL — ABNORMAL LOW (ref 0.9–3.3)

## 2014-10-16 NOTE — Patient Instructions (Signed)
Nanoparticle Albumin-Bound Paclitaxel injection (Abraxane) What is this medicine? NANOPARTICLE ALBUMIN-BOUND PACLITAXEL (Na no PAHR ti kuhl al BYOO muhn-bound PAK li TAX el) is a chemotherapy drug. It targets fast dividing cells, like cancer cells, and causes these cells to die. This medicine is used to treat advanced breast cancer and advanced lung cancer. This medicine may be used for other purposes; ask your health care provider or pharmacist if you have questions. COMMON BRAND NAME(S): Abraxane What should I tell my health care provider before I take this medicine? They need to know if you have any of these conditions: -kidney disease -liver disease -low blood counts, like low platelets, red blood cells, or white blood cells -recent or ongoing radiation therapy -an unusual or allergic reaction to paclitaxel, albumin, other chemotherapy, other medicines, foods, dyes, or preservatives -pregnant or trying to get pregnant -breast-feeding How should I use this medicine? This drug is given as an infusion into a vein. It is administered in a hospital or clinic by a specially trained health care professional. Talk to your pediatrician regarding the use of this medicine in children. Special care may be needed. Overdosage: If you think you have taken too much of this medicine contact a poison control center or emergency room at once. NOTE: This medicine is only for you. Do not share this medicine with others. What if I miss a dose? It is important not to miss your dose. Call your doctor or health care professional if you are unable to keep an appointment. What may interact with this medicine? -cyclosporine -diazepam -ketoconazole -medicines to increase blood counts like filgrastim, pegfilgrastim, sargramostim -other chemotherapy drugs like cisplatin, doxorubicin, epirubicin, etoposide, teniposide, vincristine -quinidine -testosterone -vaccines -verapamil Talk to your doctor or health care  professional before taking any of these medicines: -acetaminophen -aspirin -ibuprofen -ketoprofen -naproxen This list may not describe all possible interactions. Give your health care provider a list of all the medicines, herbs, non-prescription drugs, or dietary supplements you use. Also tell them if you smoke, drink alcohol, or use illegal drugs. Some items may interact with your medicine. What should I watch for while using this medicine? Your condition will be monitored carefully while you are receiving this medicine. You will need important blood work done while you are taking this medicine. This drug may make you feel generally unwell. This is not uncommon, as chemotherapy can affect healthy cells as well as cancer cells. Report any side effects. Continue your course of treatment even though you feel ill unless your doctor tells you to stop. In some cases, you may be given additional medicines to help with side effects. Follow all directions for their use. Call your doctor or health care professional for advice if you get a fever, chills or sore throat, or other symptoms of a cold or flu. Do not treat yourself. This drug decreases your body's ability to fight infections. Try to avoid being around people who are sick. This medicine may increase your risk to bruise or bleed. Call your doctor or health care professional if you notice any unusual bleeding. Be careful brushing and flossing your teeth or using a toothpick because you may get an infection or bleed more easily. If you have any dental work done, tell your dentist you are receiving this medicine. Avoid taking products that contain aspirin, acetaminophen, ibuprofen, naproxen, or ketoprofen unless instructed by your doctor. These medicines may hide a fever. Do not become pregnant while taking this medicine. Women should inform their doctor if they  wish to become pregnant or think they might be pregnant. There is a potential for serious side  effects to an unborn child. Talk to your health care professional or pharmacist for more information. Do not breast-feed an infant while taking this medicine. Men are advised not to father a child while receiving this medicine. What side effects may I notice from receiving this medicine? Side effects that you should report to your doctor or health care professional as soon as possible: -allergic reactions like skin rash, itching or hives, swelling of the face, lips, or tongue -low blood counts - This drug may decrease the number of white blood cells, red blood cells and platelets. You may be at increased risk for infections and bleeding. -signs of infection - fever or chills, cough, sore throat, pain or difficulty passing urine -signs of decreased platelets or bleeding - bruising, pinpoint red spots on the skin, black, tarry stools, nosebleeds -signs of decreased red blood cells - unusually weak or tired, fainting spells, lightheadedness -breathing problems -changes in vision -chest pain -high or low blood pressure -mouth sores -nausea and vomiting -pain, swelling, redness or irritation at the injection site -pain, tingling, numbness in the hands or feet -slow or irregular heartbeat -swelling of the ankle, feet, hands Side effects that usually do not require medical attention (report to your doctor or health care professional if they continue or are bothersome): -aches, pains -changes in the color of fingernails -diarrhea -hair loss -loss of appetite This list may not describe all possible side effects. Call your doctor for medical advice about side effects. You may report side effects to FDA at 1-800-FDA-1088. Where should I keep my medicine? This drug is given in a hospital or clinic and will not be stored at home. NOTE: This sheet is a summary. It may not cover all possible information. If you have questions about this medicine, talk to your doctor, pharmacist, or health care  provider.  2015, [employer]. (2012-04-19 16:48:50)

## 2014-10-16 NOTE — Assessment & Plan Note (Signed)
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 Adjuvant chemotherapy with dose dense Adriamycin and Cytoxan x 4 followed by Abraxane weekly 12 because of lymph node positive disease. 2. Followed by adjuvant radiation 3. Followed by antiestrogen therapy  Current Treatment: Today is cycle 3 day 8 dose dense Adriamycin and Cytoxan (Prev held chemotherapy for thrombocytopenia, dose reduction for neutropenia)  Chemotoxicities: 1. Hospitalization for neutropenic fever after cycle 1: Blood cultures were all been negative as well as chest x-ray. She was given antibiotics and discharged home. 2. Fatigue: Reduced the dosage of chemotherapy for cycle 2 and further with cycle 3. 3. Thrombocytopenia: Platelets 61 10/02/2014: Held chemotherapy, dose reduction with cycle 3 4. Fevers 09/18/2014: W/U negative for source of infection, patient has persistent fevers as high as 102.8 and they get better with Tylenol. 5. Anemia due to chemotherapy hemoglobin stable, monitoring closely. 6. Neutropenia: Related to chemotherapy.   Return to clinic in 1 week for cycle 4 to follow up with our nurse practitioner

## 2014-10-16 NOTE — Progress Notes (Signed)
Patient Care Team: Chipper Herb, MD as PCP - General (Family Medicine)  DIAGNOSIS: No matching staging information was found for the patient.  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, with LCIS, medial and inferior margins positive, 3/4 lymph nodes positive T2 N1 cM0 stage IIB   08/16/2014 Surgery Left breast medial margin reexcision residual ILC 0.2 cm; inferior margin residual foci less than 0.2 cm, 1/5 lymph nodes positive, 1 lymph node with isolated tumor cells (Overall 4/10)   09/04/2014 -  Chemotherapy Adjuvant chemotherapy with dose dense Adriamycin and Cytoxan 4 followed by Abraxane weekly 12    CHIEF COMPLIANT: Follow-up after cycle 3 chemotherapy  INTERVAL HISTORY: [name] is a 54 year old with above-mentioned history of left breast cancer currently on adjuvant chemotherapy with dose dense Adriamycin and Cytoxan. She received third cycle of chemotherapy last week with a dose reduction. She reports that she did well until Friday. Over the past 3 days she felt extremely tired. She is feeling back to herself again today. Denies any nausea vomiting. Eating well has good appetite.  REVIEW OF SYSTEMS:   Constitutional: Denies fevers, chills or abnormal weight loss Eyes: Denies blurriness of vision Ears, nose, mouth, throat, and face: Denies mucositis or sore throat Respiratory: Denies cough, dyspnea or wheezes Cardiovascular: Denies palpitation, chest  discomfort or lower extremity swelling Gastrointestinal:  Denies nausea, heartburn or change in bowel habits Skin: Denies abnormal skin rashes Lymphatics: Denies new lymphadenopathy or easy bruising Neurological:Denies numbness, tingling or new weaknesses Behavioral/Psych: Mood is stable, no new changes   All other systems were reviewed with the patient and are negative.  I have reviewed the past medical history, past surgical history, social history and family history with the patient and they are unchanged from previous note.  ALLERGIES:  is allergic to crestor; lipitor; and temazepam.  MEDICATIONS:  Current Outpatient Prescriptions  Medication Sig Dispense Refill  . acetaminophen (TYLENOL) 325 MG tablet Take 650 mg by mouth every 6 (six) hours as needed for moderate pain.    Marland Kitchen amoxicillin-clavulanate (AUGMENTIN) 875-125 MG per tablet Take 1 tablet by mouth 2 (two) times daily. 12 tablet 0  . clonazePAM (KLONOPIN) 0.5 MG tablet TAKE 1 TABLET THREE TIMES A DAY 90 tablet 2  . dexamethasone (DECADRON) 4 MG tablet Take 1 tablets by mouth once a day on the day after chemotherapy and then take 1 tablets two times a day for 2 days. Take with food. (Patient taking differently: Take 4 mg by mouth 2 (two) times daily as needed (nausea). Take 1 tablets by mouth once a day on the day after chemotherapy and then take 1 tablets two times a day for 2 days. Take with food.) 30 tablet 1  . hydroxypropyl methylcellulose / hypromellose (ISOPTO TEARS / GONIOVISC) 2.5 % ophthalmic solution Place 1 drop into both eyes 3 (three) times daily as needed for dry eyes.    Marland Kitchen levofloxacin (LEVAQUIN) 500 MG tablet Take  1 tablet (500 mg total) by mouth daily. 14 tablet 0  . lidocaine-prilocaine (EMLA) cream Apply to affected area once 30 g 3  . LORazepam (ATIVAN) 0.5 MG tablet Take 1 tablet (0.5 mg total) by mouth at bedtime. 30 tablet 0  . ondansetron (ZOFRAN) 8 MG tablet Take 1 tablet (8 mg total) by mouth 2 (two) times  daily as needed for nausea. Start on the third day after chemotherapy. 30 tablet 1  . PARoxetine (PAXIL-CR) 25 MG 24 hr tablet TAKE 1 TABLET BY MOUTH EVERY DAY 90 tablet 2  . prochlorperazine (COMPAZINE) 10 MG tablet Take 1 tablet (10 mg total) by mouth every 6 (six) hours as needed (Nausea or vomiting). 30 tablet 1   No current facility-administered medications for this visit.    PHYSICAL EXAMINATION: ECOG PERFORMANCE STATUS: 1 - Symptomatic but completely ambulatory  Filed Vitals:   10/16/14 0922  BP: 104/59  Pulse: 100  Temp: 98.7 F (37.1 C)  Resp: 18   Filed Weights   10/16/14 0922  Weight: 144 lb (65.318 kg)    GENERAL:alert, no distress and comfortable SKIN: skin color, texture, turgor are normal, no rashes or significant lesions EYES: normal, Conjunctiva are pink and non-injected, sclera clear OROPHARYNX:no exudate, no erythema and lips, buccal mucosa, and tongue normal  NECK: supple, thyroid normal size, non-tender, without nodularity LYMPH:  no palpable lymphadenopathy in the cervical, axillary or inguinal LUNGS: clear to auscultation and percussion with normal breathing effort HEART: regular rate & rhythm and no murmurs and no lower extremity edema ABDOMEN:abdomen soft, non-tender and normal bowel sounds Musculoskeletal:no cyanosis of digits and no clubbing  NEURO: alert & oriented x 3 with fluent speech, no focal motor/sensory deficits  LABORATORY DATA:  I have reviewed the data as listed   Chemistry      Component Value Date/Time   NA 143 10/16/2014 0910   NA 132* 09/09/2014 2336   NA 141 07/21/2013 1023   K 3.8 10/16/2014 0910   K 3.6 09/09/2014 2336   CL 99* 09/09/2014 2336   CO2 28 10/16/2014 0910   CO2 25 09/09/2014 2336   BUN 12.3 10/16/2014 0910   BUN 15 09/09/2014 2336   BUN 14 07/21/2013 1023   CREATININE 0.5* 10/16/2014 0910   CREATININE 0.43* 09/09/2014 2336   CREATININE 0.54 10/04/2012 1123      Component Value Date/Time   CALCIUM 8.0*  10/16/2014 0910   CALCIUM 8.2* 09/09/2014 2336   ALKPHOS 79 10/16/2014 0910   ALKPHOS 70 09/09/2014 2336   AST 22 10/16/2014 0910   AST 19 09/09/2014 2336   ALT 38 10/16/2014 0910   ALT 26 09/09/2014 2336   BILITOT 0.34 10/16/2014 0910   BILITOT 0.8 09/09/2014 2336       Lab Results  Component Value Date   WBC 0.3* 10/16/2014   HGB 9.1* 10/16/2014   HCT 27.7* 10/16/2014   MCV 90.9 10/16/2014   PLT 81* 10/16/2014   NEUTROABS 0.0* 10/16/2014    ASSESSMENT & PLAN:  Breast cancer of upper-outer quadrant of left female breast Left breast lumpectomy 08/08/2014: 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 Adjuvant  chemotherapy with dose dense Adriamycin and Cytoxan x 4 followed by Abraxane weekly 12 because of lymph node positive disease. 2. Followed by adjuvant radiation 3. Followed by antiestrogen therapy  Current Treatment: Today is cycle 3 day 8 dose dense Adriamycin and Cytoxan (Prev held chemotherapy for thrombocytopenia, dose reduction for neutropenia)  Chemotoxicities: 1. Hospitalization for neutropenic fever after cycle 1: Blood cultures were all been negative as well as chest x-ray. She was given antibiotics and discharged home. 2. Fatigue: Reduced the dosage of chemotherapy for cycle 2 and further with cycle 3. 3. Thrombocytopenia: Platelets 61 10/02/2014: Held chemotherapy, dose reduction with cycle 3 4. Fevers 09/18/2014: W/U negative for source of infection, patient has persistent fevers as high as 102.8 and they get better with Tylenol. 5. Anemia due to chemotherapy hemoglobin stable, monitoring  closely. 6. Neutropenia ANC 0: Related to chemotherapy. We will give her Neupogen injections this Friday, Saturday and Monday. Patient fully understands neutropenic precautions.  Instructions for chemotherapy dose adjustment: 1. If ANC is greater than 1 and platelets greater than hunger treat with same dosage 2. If ANC 0.821 and platelets are 75-100 old and lower the dosage and treat 3. If ANC is less than 0.7 or platelets less than 75 for treatment for 1 week    Return to clinic in 1 week for cycle 4 to follow up with our nurse practitioner   No orders of the defined types were placed in this encounter.   The patient has a good understanding of the overall plan. she agrees with it. she will call with any problems that may develop before the next visit here.   Rulon Eisenmenger, MD

## 2014-10-16 NOTE — Telephone Encounter (Signed)
Gave avs & calendar for August °

## 2014-10-20 ENCOUNTER — Ambulatory Visit (HOSPITAL_BASED_OUTPATIENT_CLINIC_OR_DEPARTMENT_OTHER): Payer: PRIVATE HEALTH INSURANCE

## 2014-10-20 ENCOUNTER — Other Ambulatory Visit: Payer: Self-pay | Admitting: *Deleted

## 2014-10-20 VITALS — BP 103/60 | HR 104 | Temp 99.8°F

## 2014-10-20 DIAGNOSIS — Z5189 Encounter for other specified aftercare: Secondary | ICD-10-CM

## 2014-10-20 DIAGNOSIS — D709 Neutropenia, unspecified: Secondary | ICD-10-CM

## 2014-10-20 DIAGNOSIS — R5081 Fever presenting with conditions classified elsewhere: Principal | ICD-10-CM

## 2014-10-20 DIAGNOSIS — D701 Agranulocytosis secondary to cancer chemotherapy: Secondary | ICD-10-CM | POA: Diagnosis not present

## 2014-10-20 MED ORDER — TBO-FILGRASTIM 480 MCG/0.8ML ~~LOC~~ SOSY
480.0000 ug | PREFILLED_SYRINGE | Freq: Once | SUBCUTANEOUS | Status: AC
Start: 2014-10-20 — End: 2014-10-20
  Administered 2014-10-20: 480 ug via SUBCUTANEOUS
  Filled 2014-10-20: qty 0.8

## 2014-10-21 ENCOUNTER — Ambulatory Visit (HOSPITAL_BASED_OUTPATIENT_CLINIC_OR_DEPARTMENT_OTHER): Payer: PRIVATE HEALTH INSURANCE

## 2014-10-21 VITALS — BP 89/52 | HR 107 | Temp 98.6°F | Resp 18

## 2014-10-21 DIAGNOSIS — D701 Agranulocytosis secondary to cancer chemotherapy: Secondary | ICD-10-CM | POA: Diagnosis not present

## 2014-10-21 DIAGNOSIS — Z5189 Encounter for other specified aftercare: Secondary | ICD-10-CM

## 2014-10-21 MED ORDER — TBO-FILGRASTIM 480 MCG/0.8ML ~~LOC~~ SOSY
480.0000 ug | PREFILLED_SYRINGE | Freq: Once | SUBCUTANEOUS | Status: AC
  Administered 2014-10-21: 480 ug via SUBCUTANEOUS

## 2014-10-21 NOTE — Progress Notes (Signed)
This RN contacted on call MD due to noted BP reading today of 89/52 pulse 107 and pt reported temp early am and last night of 102.8.  Pt took tylenol at 4am.  Per pt " this is what I usually do and Dr Lindi Adie knows about it '  Per discussion with on call MD and review of above symptoms and MD documentation of known pm fevers post treatment with negative work up.  Of note pt denies any chills or cough, minimal malaise, alert and oriented x 3, no dizziness. Pt is able to maintain ADL's.  Per on call MD no additional orders given.  Pt is to call if temps continue during the day and or do not respond to tylenol. She is to call if she develops chills or cough or increased feeling of malaise.  Pt verbalized understanding.

## 2014-10-23 ENCOUNTER — Ambulatory Visit: Payer: PRIVATE HEALTH INSURANCE

## 2014-10-23 ENCOUNTER — Telehealth: Payer: Self-pay | Admitting: *Deleted

## 2014-10-23 NOTE — Telephone Encounter (Signed)
Called [name] because she missed her injection appointment.  She states that she went on My Chart and cancelled her appointment.  She did not feel like she could make it today.  She has been having a fever and pain all weekend long.  She has been taking tylenol for the temperature and started on the antibiotic that Dr Lindi Adie gave her on Friday evening.  Last temperature this morning was 100.0.   She decided that she would take her changes with just the 2 injections of Granix.  She is coming in tomorrow to have labs and see Nira Conn and possible treatment.

## 2014-10-24 ENCOUNTER — Other Ambulatory Visit (HOSPITAL_BASED_OUTPATIENT_CLINIC_OR_DEPARTMENT_OTHER): Payer: PRIVATE HEALTH INSURANCE

## 2014-10-24 ENCOUNTER — Ambulatory Visit (HOSPITAL_BASED_OUTPATIENT_CLINIC_OR_DEPARTMENT_OTHER): Payer: PRIVATE HEALTH INSURANCE

## 2014-10-24 ENCOUNTER — Ambulatory Visit (HOSPITAL_BASED_OUTPATIENT_CLINIC_OR_DEPARTMENT_OTHER): Payer: PRIVATE HEALTH INSURANCE | Admitting: Nurse Practitioner

## 2014-10-24 ENCOUNTER — Encounter: Payer: Self-pay | Admitting: Nurse Practitioner

## 2014-10-24 ENCOUNTER — Ambulatory Visit (HOSPITAL_COMMUNITY)
Admission: RE | Admit: 2014-10-24 | Discharge: 2014-10-24 | Disposition: A | Discharge status: Home / Self Care | Payer: PRIVATE HEALTH INSURANCE | Source: Ambulatory Visit | Attending: Hematology and Oncology | Admitting: Hematology and Oncology

## 2014-10-24 ENCOUNTER — Ambulatory Visit: Payer: PRIVATE HEALTH INSURANCE

## 2014-10-24 ENCOUNTER — Telehealth: Payer: Self-pay | Admitting: Nurse Practitioner

## 2014-10-24 ENCOUNTER — Encounter: Payer: Self-pay | Admitting: *Deleted

## 2014-10-24 VITALS — BP 101/49 | HR 112 | Temp 99.0°F | Resp 18 | Ht 65.0 in | Wt 143.3 lb

## 2014-10-24 VITALS — BP 104/61 | HR 93 | Temp 99.1°F | Resp 17

## 2014-10-24 DIAGNOSIS — C50412 Malignant neoplasm of upper-outer quadrant of left female breast: Secondary | ICD-10-CM

## 2014-10-24 DIAGNOSIS — R509 Fever, unspecified: Secondary | ICD-10-CM | POA: Insufficient documentation

## 2014-10-24 DIAGNOSIS — Z170 Estrogen receptor positive status [ER+]: Secondary | ICD-10-CM

## 2014-10-24 DIAGNOSIS — T451X5A Adverse effect of antineoplastic and immunosuppressive drugs, initial encounter: Secondary | ICD-10-CM | POA: Diagnosis not present

## 2014-10-24 DIAGNOSIS — D696 Thrombocytopenia, unspecified: Secondary | ICD-10-CM

## 2014-10-24 DIAGNOSIS — D701 Agranulocytosis secondary to cancer chemotherapy: Secondary | ICD-10-CM

## 2014-10-24 DIAGNOSIS — D6481 Anemia due to antineoplastic chemotherapy: Secondary | ICD-10-CM

## 2014-10-24 DIAGNOSIS — C773 Secondary and unspecified malignant neoplasm of axilla and upper limb lymph nodes: Secondary | ICD-10-CM

## 2014-10-24 DIAGNOSIS — R5383 Other fatigue: Secondary | ICD-10-CM

## 2014-10-24 DIAGNOSIS — D6959 Other secondary thrombocytopenia: Secondary | ICD-10-CM | POA: Insufficient documentation

## 2014-10-24 LAB — ABO/RH: ABO/RH(D): O POS

## 2014-10-24 LAB — CBC WITH DIFFERENTIAL/PLATELET
BASO%: 1.3 % (ref 0.0–2.0)
BASOS ABS: 0.1 10*3/uL (ref 0.0–0.1)
EOS%: 0.4 % (ref 0.0–7.0)
Eosinophils Absolute: 0 10*3/uL (ref 0.0–0.5)
HCT: 25.8 % — ABNORMAL LOW (ref 34.8–46.6)
HGB: 8.2 g/dL — ABNORMAL LOW (ref 11.6–15.9)
LYMPH%: 53 % — ABNORMAL HIGH (ref 14.0–49.7)
MCH: 29.4 pg (ref 25.1–34.0)
MCHC: 31.8 g/dL (ref 31.5–36.0)
MCV: 92.5 fL (ref 79.5–101.0)
MONO#: 0.5 10*3/uL (ref 0.1–0.9)
MONO%: 8.7 % (ref 0.0–14.0)
NEUT%: 36.6 % — ABNORMAL LOW (ref 38.4–76.8)
NEUTROS ABS: 2 10*3/uL (ref 1.5–6.5)
Platelets: 53 10*3/uL — ABNORMAL LOW (ref 145–400)
RBC: 2.79 10*6/uL — ABNORMAL LOW (ref 3.70–5.45)
RDW: 17.9 % — ABNORMAL HIGH (ref 11.2–14.5)
WBC: 5.4 10*3/uL (ref 3.9–10.3)
lymph#: 2.9 10*3/uL (ref 0.9–3.3)

## 2014-10-24 LAB — COMPREHENSIVE METABOLIC PANEL (CC13)
ALT: 30 U/L (ref 0–55)
ANION GAP: 9 meq/L (ref 3–11)
AST: 45 U/L — ABNORMAL HIGH (ref 5–34)
Albumin: 2.7 g/dL — ABNORMAL LOW (ref 3.5–5.0)
Alkaline Phosphatase: 99 U/L (ref 40–150)
BUN: 5.6 mg/dL — ABNORMAL LOW (ref 7.0–26.0)
CHLORIDE: 108 meq/L (ref 98–109)
CO2: 22 meq/L (ref 22–29)
Calcium: 7.6 mg/dL — ABNORMAL LOW (ref 8.4–10.4)
Creatinine: 0.6 mg/dL (ref 0.6–1.1)
GLUCOSE: 128 mg/dL (ref 70–140)
POTASSIUM: 3.4 meq/L — AB (ref 3.5–5.1)
SODIUM: 138 meq/L (ref 136–145)
Total Bilirubin: 0.46 mg/dL (ref 0.20–1.20)
Total Protein: 5.2 g/dL — ABNORMAL LOW (ref 6.4–8.3)

## 2014-10-24 LAB — HOLD TUBE, BLOOD BANK

## 2014-10-24 LAB — PREPARE RBC (CROSSMATCH)

## 2014-10-24 MED ORDER — HEPARIN SOD (PORK) LOCK FLUSH 100 UNIT/ML IV SOLN
500.0000 [IU] | Freq: Every day | INTRAVENOUS | Status: AC | PRN
  Administered 2014-10-24: 500 [IU]
  Filled 2014-10-24: qty 5

## 2014-10-24 MED ORDER — ACETAMINOPHEN 325 MG PO TABS
ORAL_TABLET | ORAL | Status: AC
  Filled 2014-10-24: qty 2

## 2014-10-24 MED ORDER — ACETAMINOPHEN 325 MG PO TABS
650.0000 mg | ORAL_TABLET | Freq: Once | ORAL | Status: AC
  Administered 2014-10-24: 650 mg via ORAL

## 2014-10-24 MED ORDER — SODIUM CHLORIDE 0.9 % IV SOLN
250.0000 mL | Freq: Once | INTRAVENOUS | Status: AC
  Administered 2014-10-24: 250 mL via INTRAVENOUS

## 2014-10-24 MED ORDER — DIPHENHYDRAMINE HCL 25 MG PO CAPS
25.0000 mg | ORAL_CAPSULE | Freq: Once | ORAL | Status: AC
  Administered 2014-10-24: 25 mg via ORAL

## 2014-10-24 MED ORDER — DIPHENHYDRAMINE HCL 25 MG PO CAPS
ORAL_CAPSULE | ORAL | Status: AC
  Filled 2014-10-24: qty 1

## 2014-10-24 MED ORDER — SODIUM CHLORIDE 0.9 % IJ SOLN
10.0000 mL | INTRAMUSCULAR | Status: AC | PRN
  Administered 2014-10-24: 10 mL
  Filled 2014-10-24: qty 10

## 2014-10-24 NOTE — Telephone Encounter (Signed)
Appointments made and avs printed for patient °

## 2014-10-24 NOTE — Progress Notes (Signed)
Patient Care Team: Chipper Herb, MD as PCP - General (Family Medicine)  DIAGNOSIS: No matching staging information was found for the patient.  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, with LCIS, medial and inferior margins positive, 3/4 lymph nodes positive T2 N1 cM0 stage IIB   08/16/2014 Surgery Left breast medial margin reexcision residual ILC 0.2 cm; inferior margin residual foci less than 0.2 cm, 1/5 lymph nodes positive, 1 lymph node with isolated tumor cells (Overall 4/10)   09/04/2014 -  Chemotherapy Adjuvant chemotherapy with dose dense Adriamycin and Cytoxan 4 followed by Abraxane weekly 12    CHIEF COMPLIANT: to begin cycle 4 of AC  INTERVAL HISTORY: [name] is a 54 year old with above-mentioned history of left breast cancer currently on adjuvant chemotherapy with dose dense Adriamycin and Cytoxan. She has had more fevers this past week. Last night it was 101.3. Previously her tmax was over 102.0. She is taking tylenol PRN when she has chills that tip her off to the fever. New this week is increased shortness of breath and excessive fatigue. She could not get out of bed yesterday, and as a result skipped her 3rd neupogen injection.   REVIEW OF SYSTEMS:   Constitutional: fatigue, fevers and chills Eyes: Denies blurriness of vision Ears, nose, mouth, throat, and face: Denies mucositis or sore throat Respiratory:  Shortness of breath, Denies cough, dyspnea  or wheezes,  Cardiovascular: Denies palpitation, chest discomfort or lower extremity swelling Gastrointestinal:  Denies nausea, heartburn or change in bowel habits Skin: Denies abnormal skin rashes Lymphatics: Denies new lymphadenopathy or easy bruising Neurological:Denies numbness, tingling or new weaknesses Behavioral/Psych: Mood is stable, no new changes   All other systems were reviewed with the patient and are negative.  I have reviewed the past medical history, past surgical history, social history and family history with the patient and they are unchanged from previous note.  ALLERGIES:  is allergic to crestor; lipitor; and temazepam.  MEDICATIONS:  Current Outpatient Prescriptions  Medication Sig Dispense Refill  . acetaminophen (TYLENOL) 325 MG tablet Take 650 mg by mouth every 6 (six) hours as needed for moderate pain.    . clonazePAM (KLONOPIN) 0.5 MG tablet TAKE 1 TABLET THREE TIMES A DAY 90 tablet 2  . hydroxypropyl methylcellulose / hypromellose (ISOPTO TEARS / GONIOVISC) 2.5 % ophthalmic solution Place 1 drop into both eyes 3 (three) times daily as needed for dry eyes.    Marland Kitchen levofloxacin (LEVAQUIN) 500 MG tablet Take 1 tablet (500 mg total) by mouth daily. 14 tablet 0  . lidocaine-prilocaine (EMLA) cream Apply to affected area once 30 g 3  . LORazepam (ATIVAN) 0.5 MG tablet Take 1 tablet (0.5 mg total) by mouth at bedtime. 30 tablet 0  . ondansetron (ZOFRAN) 8 MG tablet Take 1 tablet (8 mg total) by mouth 2 (two) times daily as needed for nausea. Start on the third day after chemotherapy. 30 tablet 1  . PARoxetine (  PAXIL-CR) 25 MG 24 hr tablet TAKE 1 TABLET BY MOUTH EVERY DAY 90 tablet 2  . dexamethasone (DECADRON) 4 MG tablet Take 1 tablets by mouth once a day on the day after chemotherapy and then take 1 tablets two times a day for 2 days. Take with food. (Patient not taking: Reported on 10/24/2014) 30 tablet 1  . prochlorperazine (COMPAZINE) 10 MG tablet Take 1 tablet (10 mg  total) by mouth every 6 (six) hours as needed (Nausea or vomiting). (Patient not taking: Reported on 10/24/2014) 30 tablet 1   No current facility-administered medications for this visit.    PHYSICAL EXAMINATION: ECOG PERFORMANCE STATUS: 1 - Symptomatic but completely ambulatory  Filed Vitals:   10/24/14 1008  BP: 101/49  Pulse: 112  Temp: 99 F (37.2 C)  Resp: 18   Filed Weights   10/24/14 1008  Weight: 143 lb 4.8 oz (65 kg)    GENERAL:alert, no distress and comfortable SKIN: skin color, texture, turgor are normal, no rashes or significant lesions EYES: normal, Conjunctiva are pink and non-injected, sclera clear OROPHARYNX:no exudate, no erythema and lips, buccal mucosa, and tongue normal  NECK: supple, thyroid normal size, non-tender, without nodularity LYMPH:  no palpable lymphadenopathy in the cervical, axillary or inguinal LUNGS: clear to auscultation and percussion with normal breathing effort HEART: regular rate & rhythm and no murmurs and no lower extremity edema ABDOMEN:abdomen soft, non-tender and normal bowel sounds Musculoskeletal:no cyanosis of digits and no clubbing  NEURO: alert & oriented x 3 with fluent speech, no focal motor/sensory deficits  LABORATORY DATA:  I have reviewed the data as listed   Chemistry      Component Value Date/Time   NA 138 10/24/2014 0956   NA 132* 09/09/2014 2336   NA 141 07/21/2013 1023   K 3.4* 10/24/2014 0956   K 3.6 09/09/2014 2336   CL 99* 09/09/2014 2336   CO2 22 10/24/2014 0956   CO2 25 09/09/2014 2336   BUN 5.6* 10/24/2014 0956   BUN 15 09/09/2014 2336   BUN 14 07/21/2013 1023   CREATININE 0.6 10/24/2014 0956   CREATININE 0.43* 09/09/2014 2336   CREATININE 0.54 10/04/2012 1123      Component Value Date/Time   CALCIUM 7.6* 10/24/2014 0956   CALCIUM 8.2* 09/09/2014 2336   ALKPHOS 99 10/24/2014 0956   ALKPHOS 70 09/09/2014 2336   AST 45* 10/24/2014 0956   AST 19 09/09/2014 2336   ALT 30 10/24/2014 0956   ALT 26  09/09/2014 2336   BILITOT 0.46 10/24/2014 0956   BILITOT 0.8 09/09/2014 2336       Lab Results  Component Value Date   WBC 5.4 10/24/2014   HGB 8.2* 10/24/2014   HCT 25.8* 10/24/2014   MCV 92.5 10/24/2014   PLT 53* 10/24/2014   NEUTROABS 2.0 10/24/2014    ASSESSMENT & PLAN:  Breast cancer of upper-outer quadrant of left female breast Left breast lumpectomy 08/08/2014: 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 Adjuvant chemotherapy with dose dense Adriamycin and Cytoxan x 4 followed by Abraxane weekly 12 because of lymph node positive disease. 2. Followed by adjuvant radiation 3. Followed by  antiestrogen therapy  Current Treatment: Today is cycle 3 day 8 dose dense Adriamycin and Cytoxan (Prev held chemotherapy for thrombocytopenia, dose reduction for neutropenia)  Chemotoxicities: 1. Hospitalization for neutropenic fever after cycle 1: Blood cultures were all been negative as well as chest x-ray. She was given antibiotics and discharged home. 2. Fatigue: Reduced the dosage of chemotherapy for cycle 2 and further with cycle 3. 3. Thrombocytopenia: Platelets 61 10/02/2014: Held chemotherapy, dose reduction with cycle 3. Platelets 53 10/24/2014: hold chemotherapy today 4. Fevers 09/18/2014: W/U negative for source of infection, patient has persistent fevers as high as 102.8 and they get better with Tylenol. 5. Anemia due to chemotherapy: symptomatic anemia with hgb 8.2. Hold treatment. 2 units of blood to be given today 6. Neutropenia: managed with neupogen injections after chemo treatments  Instructions for  chemotherapy dose adjustment: 1. If ANC is greater than 1 and platelets greater than hunger treat with same dosage 2. If ANC 0.821 and platelets are 75-100 old and lower the dosage and treat 3. If ANC is less than 0.7 or platelets less than 75 for treatment for 1 week   Catlynn complains of increased shortness of breath and excessive fatigue today. hgb down to 8.2. Will hold treatment today and administer 2 units of blood. Platelets dropping down to 53 today was also a factor in holding treatment. Will consult with Dr. Lindi Adie next week about the dose of her 4th and final cycle of AC. She will continue tylenol PRN for her fevers.  Patient will return in 1 week for labs and follow up.  No orders of the defined types were placed in this encounter.   The patient has a good understanding of the overall plan. she agrees with it. she will call with any problems that may develop before the next visit here.   Laurie Panda, NP

## 2014-10-24 NOTE — Patient Instructions (Signed)

## 2014-10-25 ENCOUNTER — Ambulatory Visit (HOSPITAL_BASED_OUTPATIENT_CLINIC_OR_DEPARTMENT_OTHER): Payer: PRIVATE HEALTH INSURANCE

## 2014-10-25 VITALS — BP 100/65 | HR 86 | Temp 98.5°F | Resp 20

## 2014-10-25 DIAGNOSIS — D6481 Anemia due to antineoplastic chemotherapy: Secondary | ICD-10-CM | POA: Diagnosis not present

## 2014-10-25 DIAGNOSIS — T451X5A Adverse effect of antineoplastic and immunosuppressive drugs, initial encounter: Principal | ICD-10-CM

## 2014-10-25 MED ORDER — SODIUM CHLORIDE 0.9 % IJ SOLN
10.0000 mL | INTRAMUSCULAR | Status: AC | PRN
  Administered 2014-10-25: 10 mL
  Filled 2014-10-25: qty 10

## 2014-10-25 MED ORDER — ACETAMINOPHEN 325 MG PO TABS
ORAL_TABLET | ORAL | Status: AC
  Filled 2014-10-25: qty 2

## 2014-10-25 MED ORDER — ACETAMINOPHEN 325 MG PO TABS
650.0000 mg | ORAL_TABLET | Freq: Once | ORAL | Status: AC
  Administered 2014-10-25: 650 mg via ORAL

## 2014-10-25 MED ORDER — HEPARIN SOD (PORK) LOCK FLUSH 100 UNIT/ML IV SOLN
500.0000 [IU] | Freq: Every day | INTRAVENOUS | Status: AC | PRN
  Administered 2014-10-25: 500 [IU]
  Filled 2014-10-25: qty 5

## 2014-10-25 MED ORDER — DIPHENHYDRAMINE HCL 25 MG PO CAPS
25.0000 mg | ORAL_CAPSULE | Freq: Once | ORAL | Status: AC
  Administered 2014-10-25: 25 mg via ORAL

## 2014-10-25 MED ORDER — DIPHENHYDRAMINE HCL 25 MG PO CAPS
ORAL_CAPSULE | ORAL | Status: AC
  Filled 2014-10-25: qty 1

## 2014-10-25 MED ORDER — HEPARIN SOD (PORK) LOCK FLUSH 100 UNIT/ML IV SOLN
250.0000 [IU] | INTRAVENOUS | Status: DC | PRN
  Filled 2014-10-25: qty 5

## 2014-10-25 NOTE — Patient Instructions (Signed)

## 2014-10-26 LAB — TYPE AND SCREEN
ABO/RH(D): O POS
Antibody Screen: NEGATIVE
UNIT DIVISION: 0
Unit division: 0

## 2014-10-30 ENCOUNTER — Ambulatory Visit (HOSPITAL_BASED_OUTPATIENT_CLINIC_OR_DEPARTMENT_OTHER): Payer: PRIVATE HEALTH INSURANCE | Admitting: Nurse Practitioner

## 2014-10-30 ENCOUNTER — Telehealth: Payer: Self-pay | Admitting: Hematology and Oncology

## 2014-10-30 ENCOUNTER — Other Ambulatory Visit (HOSPITAL_BASED_OUTPATIENT_CLINIC_OR_DEPARTMENT_OTHER): Payer: PRIVATE HEALTH INSURANCE

## 2014-10-30 ENCOUNTER — Ambulatory Visit (HOSPITAL_BASED_OUTPATIENT_CLINIC_OR_DEPARTMENT_OTHER): Payer: PRIVATE HEALTH INSURANCE

## 2014-10-30 ENCOUNTER — Encounter: Payer: Self-pay | Admitting: Nurse Practitioner

## 2014-10-30 VITALS — BP 96/53 | HR 110 | Temp 100.9°F | Resp 17 | Ht 65.0 in | Wt 143.4 lb

## 2014-10-30 VITALS — HR 100

## 2014-10-30 DIAGNOSIS — D6481 Anemia due to antineoplastic chemotherapy: Secondary | ICD-10-CM

## 2014-10-30 DIAGNOSIS — R51 Headache: Secondary | ICD-10-CM

## 2014-10-30 DIAGNOSIS — C50412 Malignant neoplasm of upper-outer quadrant of left female breast: Secondary | ICD-10-CM

## 2014-10-30 DIAGNOSIS — C773 Secondary and unspecified malignant neoplasm of axilla and upper limb lymph nodes: Secondary | ICD-10-CM

## 2014-10-30 DIAGNOSIS — Z5111 Encounter for antineoplastic chemotherapy: Secondary | ICD-10-CM

## 2014-10-30 DIAGNOSIS — R530 Neoplastic (malignant) related fatigue: Secondary | ICD-10-CM

## 2014-10-30 DIAGNOSIS — Z170 Estrogen receptor positive status [ER+]: Secondary | ICD-10-CM | POA: Diagnosis not present

## 2014-10-30 DIAGNOSIS — R519 Headache, unspecified: Secondary | ICD-10-CM | POA: Insufficient documentation

## 2014-10-30 DIAGNOSIS — Z5189 Encounter for other specified aftercare: Secondary | ICD-10-CM

## 2014-10-30 DIAGNOSIS — D696 Thrombocytopenia, unspecified: Secondary | ICD-10-CM

## 2014-10-30 DIAGNOSIS — R509 Fever, unspecified: Secondary | ICD-10-CM

## 2014-10-30 DIAGNOSIS — D701 Agranulocytosis secondary to cancer chemotherapy: Secondary | ICD-10-CM

## 2014-10-30 DIAGNOSIS — G4452 New daily persistent headache (NDPH): Secondary | ICD-10-CM

## 2014-10-30 LAB — CBC WITH DIFFERENTIAL/PLATELET
BASO%: 0.7 % (ref 0.0–2.0)
Basophils Absolute: 0 10*3/uL (ref 0.0–0.1)
EOS ABS: 0 10*3/uL (ref 0.0–0.5)
EOS%: 0.2 % (ref 0.0–7.0)
HCT: 33.4 % — ABNORMAL LOW (ref 34.8–46.6)
HGB: 11.3 g/dL — ABNORMAL LOW (ref 11.6–15.9)
LYMPH%: 28 % (ref 14.0–49.7)
MCH: 30.1 pg (ref 25.1–34.0)
MCHC: 33.8 g/dL (ref 31.5–36.0)
MCV: 88.8 fL (ref 79.5–101.0)
MONO#: 0.5 10*3/uL (ref 0.1–0.9)
MONO%: 11.1 % (ref 0.0–14.0)
NEUT#: 2.6 10*3/uL (ref 1.5–6.5)
NEUT%: 60 % (ref 38.4–76.8)
PLATELETS: 105 10*3/uL — AB (ref 145–400)
RBC: 3.76 10*6/uL (ref 3.70–5.45)
RDW: 18.3 % — ABNORMAL HIGH (ref 11.2–14.5)
WBC: 4.3 10*3/uL (ref 3.9–10.3)
lymph#: 1.2 10*3/uL (ref 0.9–3.3)

## 2014-10-30 LAB — COMPREHENSIVE METABOLIC PANEL (CC13)
ALT: 23 U/L (ref 0–55)
ANION GAP: 11 meq/L (ref 3–11)
AST: 41 U/L — ABNORMAL HIGH (ref 5–34)
Albumin: 2.8 g/dL — ABNORMAL LOW (ref 3.5–5.0)
Alkaline Phosphatase: 71 U/L (ref 40–150)
BUN: 8 mg/dL (ref 7.0–26.0)
CHLORIDE: 104 meq/L (ref 98–109)
CO2: 20 meq/L — AB (ref 22–29)
Calcium: 7.8 mg/dL — ABNORMAL LOW (ref 8.4–10.4)
Creatinine: 0.6 mg/dL (ref 0.6–1.1)
Glucose: 106 mg/dl (ref 70–140)
POTASSIUM: 3.6 meq/L (ref 3.5–5.1)
Sodium: 135 mEq/L — ABNORMAL LOW (ref 136–145)
Total Bilirubin: 0.68 mg/dL (ref 0.20–1.20)
Total Protein: 5.2 g/dL — ABNORMAL LOW (ref 6.4–8.3)

## 2014-10-30 MED ORDER — SODIUM CHLORIDE 0.9 % IV SOLN
350.0000 mg/m2 | Freq: Once | INTRAVENOUS | Status: AC
  Administered 2014-10-30: 600 mg via INTRAVENOUS
  Filled 2014-10-30: qty 30

## 2014-10-30 MED ORDER — DOXORUBICIN HCL CHEMO IV INJECTION 2 MG/ML
35.0000 mg/m2 | Freq: Once | INTRAVENOUS | Status: AC
  Administered 2014-10-30: 60 mg via INTRAVENOUS
  Filled 2014-10-30: qty 30

## 2014-10-30 MED ORDER — PALONOSETRON HCL INJECTION 0.25 MG/5ML
INTRAVENOUS | Status: AC
  Filled 2014-10-30: qty 5

## 2014-10-30 MED ORDER — SODIUM CHLORIDE 0.9 % IV SOLN
Freq: Once | INTRAVENOUS | Status: AC
  Administered 2014-10-30: 09:00:00 via INTRAVENOUS

## 2014-10-30 MED ORDER — HEPARIN SOD (PORK) LOCK FLUSH 100 UNIT/ML IV SOLN
500.0000 [IU] | Freq: Once | INTRAVENOUS | Status: AC | PRN
  Administered 2014-10-30: 500 [IU]
  Filled 2014-10-30: qty 5

## 2014-10-30 MED ORDER — PALONOSETRON HCL INJECTION 0.25 MG/5ML
0.2500 mg | Freq: Once | INTRAVENOUS | Status: AC
  Administered 2014-10-30: 0.25 mg via INTRAVENOUS

## 2014-10-30 MED ORDER — SODIUM CHLORIDE 0.9 % IV SOLN
Freq: Once | INTRAVENOUS | Status: AC
  Administered 2014-10-30: 09:00:00 via INTRAVENOUS
  Filled 2014-10-30: qty 5

## 2014-10-30 MED ORDER — PEGFILGRASTIM 6 MG/0.6ML ~~LOC~~ PSKT
6.0000 mg | PREFILLED_SYRINGE | Freq: Once | SUBCUTANEOUS | Status: AC
  Administered 2014-10-30: 6 mg via SUBCUTANEOUS
  Filled 2014-10-30: qty 0.6

## 2014-10-30 MED ORDER — SODIUM CHLORIDE 0.9 % IJ SOLN
10.0000 mL | INTRAMUSCULAR | Status: DC | PRN
  Administered 2014-10-30: 10 mL
  Filled 2014-10-30: qty 10

## 2014-10-30 NOTE — Telephone Encounter (Signed)
Gave relative avs report and appointments for August and September.

## 2014-10-30 NOTE — Telephone Encounter (Signed)
Add to previous note...................Marland Kitchen Appointments scheduled by [place].

## 2014-10-30 NOTE — Patient Instructions (Signed)
[facility] Discharge Instructions for Patients Receiving Chemotherapy  Today you received the following chemotherapy agents:Adriamycin and Cytoxan   To help prevent nausea and vomiting after your treatment, we encourage you to take your nausea medication as directed.    If you develop nausea and vomiting that is not controlled by your nausea medication, call the clinic.   BELOW ARE SYMPTOMS THAT SHOULD BE REPORTED IMMEDIATELY:  *FEVER GREATER THAN 100.5 F  *CHILLS WITH OR WITHOUT FEVER  NAUSEA AND VOMITING THAT IS NOT CONTROLLED WITH YOUR NAUSEA MEDICATION  *UNUSUAL SHORTNESS OF BREATH  *UNUSUAL BRUISING OR BLEEDING  TENDERNESS IN MOUTH AND THROAT WITH OR WITHOUT PRESENCE OF ULCERS  *URINARY PROBLEMS  *BOWEL PROBLEMS  UNUSUAL RASH Items with * indicate a potential emergency and should be followed up as soon as possible.  Feel free to call the clinic you have any questions or concerns. The clinic phone number is [phone].  Please show the CHEMO ALERT CARD at check-in to the Emergency Department and triage nurse.   

## 2014-10-30 NOTE — Progress Notes (Signed)
Patient Care Team: Chipper Herb, MD as PCP - General (Family Medicine)  DIAGNOSIS: No matching staging information was found for the patient.  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, with LCIS, medial and inferior margins positive, 3/4 lymph nodes positive T2 N1 cM0 stage IIB   08/16/2014 Surgery Left breast medial margin reexcision residual ILC 0.2 cm; inferior margin residual foci less than 0.2 cm, 1/5 lymph nodes positive, 1 lymph node with isolated tumor cells (Overall 4/10)   09/04/2014 -  Chemotherapy Adjuvant chemotherapy with dose dense Adriamycin and Cytoxan 4 followed by Abraxane weekly 12    CHIEF COMPLIANT: to begin cycle 4 of AC  INTERVAL HISTORY: [name] is a 54 year old with above-mentioned history of left breast cancer currently on adjuvant chemotherapy with dose dense Adriamycin and Cytoxan. Last week's dose was held because of low platelets and excessive shortness of breath/fatgiue. She was given 2 units of blood, but does not feel much better. Her counts are better this week, however. Her temp is 100.9 today. She has new headaches now to the base of her skull. She has been on tylenol but that is minimally effective. Voltaren gel was helpful, but does not last long. She tried warm cloths to the upper neck, which helps but it makes her fever higher.   REVIEW OF SYSTEMS:   Constitutional: fatigue, fevers and chills Eyes: Denies blurriness of  vision Ears, nose, mouth, throat, and face: Denies mucositis or sore throat Respiratory:  Shortness of breath, Denies cough, dyspnea or wheezes,  Cardiovascular: Denies palpitation, chest discomfort or lower extremity swelling Gastrointestinal:  Denies nausea, heartburn or change in bowel habits Skin: Denies abnormal skin rashes Lymphatics: Denies new lymphadenopathy or easy bruising Neurological:Denies numbness, tingling or new weaknesses Behavioral/Psych: Mood is stable, no new changes   All other systems were reviewed with the patient and are negative.  I have reviewed the past medical history, past surgical history, social history and family history with the patient and they are unchanged from previous note.  ALLERGIES:  is allergic to crestor; lipitor; and temazepam.  MEDICATIONS:  Current Outpatient Prescriptions  Medication Sig Dispense Refill  . acetaminophen (TYLENOL) 325 MG tablet Take 650 mg by mouth every 6 (six) hours as needed for moderate pain.    . clonazePAM (KLONOPIN) 0.5 MG tablet TAKE 1 TABLET THREE TIMES A DAY 90 tablet 2  . hydroxypropyl methylcellulose / hypromellose (ISOPTO TEARS / GONIOVISC) 2.5 % ophthalmic solution Place 1 drop into both eyes 3 (three) times daily as needed for dry eyes.    Marland Kitchen lidocaine-prilocaine (EMLA) cream Apply to affected area once 30 g 3  . LORazepam (ATIVAN) 0.5 MG tablet Take 1 tablet (0.5 mg total) by mouth at bedtime. 30 tablet 0  . ondansetron (ZOFRAN) 8 MG tablet Take 1 tablet (8 mg total) by mouth 2 (two) times daily as needed for nausea. Start on the third day after chemotherapy. 30 tablet 1  .  PARoxetine (PAXIL-CR) 25 MG 24 hr tablet TAKE 1 TABLET BY MOUTH EVERY DAY 90 tablet 2  . dexamethasone (DECADRON) 4 MG tablet Take 1 tablets by mouth once a day on the day after chemotherapy and then take 1 tablets two times a day for 2 days. Take with food. (Patient not taking: Reported on 10/24/2014) 30 tablet 1  . prochlorperazine  (COMPAZINE) 10 MG tablet Take 1 tablet (10 mg total) by mouth every 6 (six) hours as needed (Nausea or vomiting). (Patient not taking: Reported on 10/24/2014) 30 tablet 1   No current facility-administered medications for this visit.    PHYSICAL EXAMINATION: ECOG PERFORMANCE STATUS: 1 - Symptomatic but completely ambulatory  Filed Vitals:   10/30/14 0816  BP: 96/53  Pulse: 110  Temp: 100.9 F (38.3 C)  Resp: 17   Filed Weights   10/30/14 0816  Weight: 143 lb 6.4 oz (65.046 kg)    GENERAL:alert, no distress and comfortable SKIN: skin color, texture, turgor are normal, no rashes or significant lesions EYES: normal, Conjunctiva are pink and non-injected, sclera clear OROPHARYNX:no exudate, no erythema and lips, buccal mucosa, and tongue normal  NECK: supple, thyroid normal size, non-tender, without nodularity LYMPH:  no palpable lymphadenopathy in the cervical, axillary or inguinal LUNGS: clear to auscultation and percussion with normal breathing effort HEART: regular rate & rhythm and no murmurs and no lower extremity edema ABDOMEN:abdomen soft, non-tender and normal bowel sounds Musculoskeletal:no cyanosis of digits and no clubbing  NEURO: alert & oriented x 3 with fluent speech, no focal motor/sensory deficits  LABORATORY DATA:  I have reviewed the data as listed   Chemistry      Component Value Date/Time   NA 135* 10/30/2014 0806   NA 132* 09/09/2014 2336   NA 141 07/21/2013 1023   K 3.6 10/30/2014 0806   K 3.6 09/09/2014 2336   CL 99* 09/09/2014 2336   CO2 20* 10/30/2014 0806   CO2 25 09/09/2014 2336   BUN 8.0 10/30/2014 0806   BUN 15 09/09/2014 2336   BUN 14 07/21/2013 1023   CREATININE 0.6 10/30/2014 0806   CREATININE 0.43* 09/09/2014 2336   CREATININE 0.54 10/04/2012 1123      Component Value Date/Time   CALCIUM 7.8* 10/30/2014 0806   CALCIUM 8.2* 09/09/2014 2336   ALKPHOS 71 10/30/2014 0806   ALKPHOS 70 09/09/2014 2336   AST 41* 10/30/2014 0806   AST 19  09/09/2014 2336   ALT 23 10/30/2014 0806   ALT 26 09/09/2014 2336   BILITOT 0.68 10/30/2014 0806   BILITOT 0.8 09/09/2014 2336       Lab Results  Component Value Date   WBC 4.3 10/30/2014   HGB 11.3* 10/30/2014   HCT 33.4* 10/30/2014   MCV 88.8 10/30/2014   PLT 105* 10/30/2014   NEUTROABS 2.6 10/30/2014    ASSESSMENT & PLAN:  Breast cancer of upper-outer quadrant of left female breast Left breast lumpectomy 08/08/2014: 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 Adjuvant chemotherapy with dose dense Adriamycin and Cytoxan x 4 followed by Abraxane weekly 12 because of lymph node positive disease. 2. Followed by adjuvant radiation 3. Followed  by antiestrogen therapy  Current Treatment: Today is cycle 3 day 8 dose dense Adriamycin and Cytoxan (Prev held chemotherapy for thrombocytopenia, dose reduction for neutropenia)  Chemotoxicities: 1. Hospitalization for neutropenic fever after cycle 1: Blood cultures were all been negative as well as chest x-ray. She was given antibiotics and discharged home. 2. Fatigue: Reduced the dosage of chemotherapy for cycle 2 and further with cycle 3. Will reduce again with 4th cycle. 3. Thrombocytopenia: Platelets 61 10/02/2014: Held chemotherapy, dose reduction with cycle 3. Platelets 53 10/24/2014: held chemotherapy, dose reduction with cycle 4. 4. Fevers 09/18/2014: W/U negative for source of infection, patient has persistent fevers as high as 102.8 and they get better with Tylenol. 5. Anemia due to chemotherapy: symptomatic anemia with hgb 8.2. Improvement to 11.3 after 2 units of blood  on 10/23/14 6. Neutropenia: managed with neupogen injections after chemo treatments 7. Headaches: continue tylenol. Use massage or icy hot to shoulders and neck area  Permission from Dr. Lindi Adie to proceed with last AC dose, despite fever today. He is reducing the dose.   Instructions for chemotherapy dose adjustment: 1. If ANC is greater than 1 and platelets greater than hunger treat with same dosage 2. If ANC 0.821 and platelets are 75-100 old and lower the dosage and treat 3. If ANC is less than 0.7 or platelets less than 75 for treatment for 1 week  Patient will return in 1 week for labs and follow up.  No orders of the defined types were placed in this encounter.   The patient has a good understanding of the overall plan. she agrees with it. she will call with any problems that may develop before the next visit here.   Laurie Panda, NP

## 2014-11-06 ENCOUNTER — Other Ambulatory Visit (HOSPITAL_BASED_OUTPATIENT_CLINIC_OR_DEPARTMENT_OTHER): Payer: PRIVATE HEALTH INSURANCE

## 2014-11-06 ENCOUNTER — Encounter: Payer: Self-pay | Admitting: Hematology and Oncology

## 2014-11-06 ENCOUNTER — Other Ambulatory Visit: Payer: Self-pay | Admitting: *Deleted

## 2014-11-06 ENCOUNTER — Ambulatory Visit (HOSPITAL_BASED_OUTPATIENT_CLINIC_OR_DEPARTMENT_OTHER): Payer: PRIVATE HEALTH INSURANCE | Admitting: Hematology and Oncology

## 2014-11-06 VITALS — BP 129/68 | HR 104 | Temp 98.2°F | Resp 18 | Ht 65.0 in | Wt 143.8 lb

## 2014-11-06 DIAGNOSIS — R530 Neoplastic (malignant) related fatigue: Secondary | ICD-10-CM | POA: Diagnosis not present

## 2014-11-06 DIAGNOSIS — C773 Secondary and unspecified malignant neoplasm of axilla and upper limb lymph nodes: Secondary | ICD-10-CM | POA: Diagnosis not present

## 2014-11-06 DIAGNOSIS — T451X5A Adverse effect of antineoplastic and immunosuppressive drugs, initial encounter: Secondary | ICD-10-CM

## 2014-11-06 DIAGNOSIS — G4452 New daily persistent headache (NDPH): Secondary | ICD-10-CM

## 2014-11-06 DIAGNOSIS — D709 Neutropenia, unspecified: Secondary | ICD-10-CM

## 2014-11-06 DIAGNOSIS — C50412 Malignant neoplasm of upper-outer quadrant of left female breast: Secondary | ICD-10-CM

## 2014-11-06 DIAGNOSIS — E785 Hyperlipidemia, unspecified: Secondary | ICD-10-CM

## 2014-11-06 DIAGNOSIS — Z170 Estrogen receptor positive status [ER+]: Secondary | ICD-10-CM

## 2014-11-06 DIAGNOSIS — R5081 Fever presenting with conditions classified elsewhere: Secondary | ICD-10-CM

## 2014-11-06 DIAGNOSIS — D6959 Other secondary thrombocytopenia: Secondary | ICD-10-CM

## 2014-11-06 DIAGNOSIS — D6481 Anemia due to antineoplastic chemotherapy: Secondary | ICD-10-CM

## 2014-11-06 LAB — COMPREHENSIVE METABOLIC PANEL (CC13)
ALT: 32 U/L (ref 0–55)
AST: 27 U/L (ref 5–34)
Albumin: 3.1 g/dL — ABNORMAL LOW (ref 3.5–5.0)
Alkaline Phosphatase: 80 U/L (ref 40–150)
Anion Gap: 8 mEq/L (ref 3–11)
BILIRUBIN TOTAL: 0.85 mg/dL (ref 0.20–1.20)
BUN: 7.3 mg/dL (ref 7.0–26.0)
CO2: 26 meq/L (ref 22–29)
CREATININE: 0.5 mg/dL — AB (ref 0.6–1.1)
Calcium: 8.2 mg/dL — ABNORMAL LOW (ref 8.4–10.4)
Chloride: 105 mEq/L (ref 98–109)
GLUCOSE: 100 mg/dL (ref 70–140)
Potassium: 3.6 mEq/L (ref 3.5–5.1)
SODIUM: 139 meq/L (ref 136–145)
TOTAL PROTEIN: 5.5 g/dL — AB (ref 6.4–8.3)

## 2014-11-06 LAB — CBC WITH DIFFERENTIAL/PLATELET
BASO%: 3.7 % — AB (ref 0.0–2.0)
Basophils Absolute: 0 10*3/uL (ref 0.0–0.1)
EOS%: 0 % (ref 0.0–7.0)
Eosinophils Absolute: 0 10*3/uL (ref 0.0–0.5)
HCT: 27 % — ABNORMAL LOW (ref 34.8–46.6)
HGB: 8.8 g/dL — ABNORMAL LOW (ref 11.6–15.9)
LYMPH%: 77.8 % — ABNORMAL HIGH (ref 14.0–49.7)
MCH: 29.9 pg (ref 25.1–34.0)
MCHC: 32.6 g/dL (ref 31.5–36.0)
MCV: 91.8 fL (ref 79.5–101.0)
MONO#: 0 10*3/uL — ABNORMAL LOW (ref 0.1–0.9)
MONO%: 11.1 % (ref 0.0–14.0)
NEUT%: 7.4 % — ABNORMAL LOW (ref 38.4–76.8)
NEUTROS ABS: 0 10*3/uL — AB (ref 1.5–6.5)
NRBC: 0 % (ref 0–0)
Platelets: 45 10*3/uL — ABNORMAL LOW (ref 145–400)
RBC: 2.94 10*6/uL — AB (ref 3.70–5.45)
RDW: 17.7 % — AB (ref 11.2–14.5)
WBC: 0.3 10*3/uL — AB (ref 3.9–10.3)
lymph#: 0.2 10*3/uL — ABNORMAL LOW (ref 0.9–3.3)

## 2014-11-06 NOTE — Progress Notes (Signed)
Patient Care Team: Chipper Herb, MD as PCP - General (Family Medicine)  DIAGNOSIS: No matching staging information was found for the patient.  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, with LCIS, medial and inferior margins positive, 3/4 lymph nodes positive T2 N1 cM0 stage IIB   08/16/2014 Surgery Left breast medial margin reexcision residual ILC 0.2 cm; inferior margin residual foci less than 0.2 cm, 1/5 lymph nodes positive, 1 lymph node with isolated tumor cells (Overall 4/10)   09/04/2014 -  Chemotherapy Adjuvant chemotherapy with dose dense Adriamycin and Cytoxan 4 followed by Abraxane weekly 12    CHIEF COMPLIANT: Cycle 4 day 8 dose dense Adriamycin and Cytoxan  INTERVAL HISTORY: [name] is a 54 year old with above-mentioned history of left breast cancer currently on adjuvant chemotherapy with dose dense E ramus and Cytoxan. She will start weekly Abraxane next week. She had a few dose delays from chemotherapy. We have tried even to give Neupogen injections prior to chemotherapy cycle but that led to diffuse back spasms. She does not want to the Neupogen injections anymore. She gets Neulasta with each chemotherapy. Today her [place] is 0. She is also anemic with a hemoglobin of 8.8 and a platelet count of 45. She reports to be doing quite well. She does not have too much fatigue. She's been working from home. Denies any nausea or vomiting.  REVIEW OF SYSTEMS:    Constitutional: Denies fevers, chills or abnormal weight loss Eyes: Denies blurriness of vision Ears, nose, mouth, throat, and face: Denies mucositis or sore throat Respiratory: Denies cough, dyspnea or wheezes Cardiovascular: Denies palpitation, chest discomfort or lower extremity swelling Gastrointestinal:  Denies nausea, heartburn or change in bowel habits Skin: Denies abnormal skin rashes Lymphatics: Denies new lymphadenopathy or easy bruising Neurological:Denies numbness, tingling or new weaknesses Behavioral/Psych: Mood is stable, no new changes  All other systems were reviewed with the patient and are negative.  I have reviewed the past medical history, past surgical history, social history and family history with the patient and they are unchanged from previous note.  ALLERGIES:  is allergic to crestor; lipitor; and temazepam.  MEDICATIONS:  Current Outpatient Prescriptions  Medication Sig Dispense Refill  . acetaminophen (TYLENOL) 325 MG tablet Take 650 mg by mouth every 6 (six) hours as needed for moderate pain.    . clonazePAM (KLONOPIN) 0.5 MG tablet TAKE 1 TABLET THREE TIMES A DAY 90 tablet 2  . dexamethasone (DECADRON) 4 MG tablet Take 1 tablets by mouth once a day on the day after chemotherapy and then take 1 tablets two times a day for 2 days. Take with food. 30 tablet 1  . hydroxypropyl methylcellulose / hypromellose (ISOPTO TEARS / GONIOVISC) 2.5 % ophthalmic solution Place 1 drop into both eyes 3 (three) times daily as needed for dry eyes.    Marland Kitchen lidocaine-prilocaine (EMLA) cream Apply to affected area once 30 g 3  . LORazepam (ATIVAN) 0.5 MG tablet  Take 1 tablet (0.5 mg total) by mouth at bedtime. 30 tablet 0  . ondansetron (ZOFRAN) 8 MG tablet Take 1 tablet (8 mg total) by mouth 2 (two) times daily as needed for nausea. Start on the third day after chemotherapy. 30 tablet 1  . PARoxetine (PAXIL-CR) 25 MG 24 hr tablet TAKE 1 TABLET BY MOUTH EVERY DAY 90 tablet 2  .  prochlorperazine (COMPAZINE) 10 MG tablet Take 1 tablet (10 mg total) by mouth every 6 (six) hours as needed (Nausea or vomiting). 30 tablet 1   No current facility-administered medications for this visit.    PHYSICAL EXAMINATION: ECOG PERFORMANCE STATUS: 1 - Symptomatic but completely ambulatory  Filed Vitals:   11/06/14 0822  BP: 129/68  Pulse: 104  Temp: 98.2 F (36.8 C)  Resp: 18   Filed Weights   11/06/14 0822  Weight: 143 lb 12.8 oz (65.227 kg)    GENERAL:alert, no distress and comfortable SKIN: skin color, texture, turgor are normal, no rashes or significant lesions EYES: normal, Conjunctiva are pink and non-injected, sclera clear OROPHARYNX:no exudate, no erythema and lips, buccal mucosa, and tongue normal  NECK: supple, thyroid normal size, non-tender, without nodularity LYMPH:  no palpable lymphadenopathy in the cervical, axillary or inguinal LUNGS: clear to auscultation and percussion with normal breathing effort HEART: regular rate & rhythm and no murmurs and no lower extremity edema ABDOMEN:abdomen soft, non-tender and normal bowel sounds Musculoskeletal:no cyanosis of digits and no clubbing  NEURO: alert & oriented x 3 with fluent speech, no focal motor/sensory deficits  LABORATORY DATA:  I have reviewed the data as listed   Chemistry      Component Value Date/Time   NA 139 11/06/2014 0818   NA 132* 09/09/2014 2336   NA 141 07/21/2013 1023   K 3.6 11/06/2014 0818   K 3.6 09/09/2014 2336   CL 99* 09/09/2014 2336   CO2 26 11/06/2014 0818   CO2 25 09/09/2014 2336   BUN 7.3 11/06/2014 0818   BUN 15 09/09/2014 2336   BUN 14 07/21/2013 1023   CREATININE 0.5* 11/06/2014 0818   CREATININE 0.43* 09/09/2014 2336   CREATININE 0.54 10/04/2012 1123      Component Value Date/Time   CALCIUM 8.2* 11/06/2014 0818   CALCIUM 8.2* 09/09/2014 2336   ALKPHOS 80 11/06/2014 0818   ALKPHOS 70 09/09/2014 2336   AST 27 11/06/2014 0818   AST 19 09/09/2014 2336   ALT 32  11/06/2014 0818   ALT 26 09/09/2014 2336   BILITOT 0.85 11/06/2014 0818   BILITOT 0.8 09/09/2014 2336       Lab Results  Component Value Date   WBC 0.3* 11/06/2014   HGB 8.8* 11/06/2014   HCT 27.0* 11/06/2014   MCV 91.8 11/06/2014   PLT 45* 11/06/2014   NEUTROABS 0.0* 11/06/2014   ASSESSMENT & PLAN:  Breast cancer of upper-outer quadrant of left female breast Left breast lumpectomy 08/08/2014: 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 Adjuvant chemotherapy with dose dense Adriamycin and Cytoxan x 4 followed by Abraxane weekly 12 because of lymph node positive disease. 2. Followed by adjuvant radiation 3. Followed by antiestrogen therapy  Current Treatment: Today is cycle 4 day 8 dose dense Adriamycin and Cytoxan (Prev held chemotherapy for thrombocytopenia, dose reduction for neutropenia)  Chemotoxicities: 1. Hospitalization for neutropenic fever after cycle 1: Blood cultures were all been negative as well as chest x-ray. She was given antibiotics and discharged home. 2. Fatigue: Reduced the dosage of chemotherapy for cycle 2 and further with cycle 3. Will reduce again with 4th cycle. 3. Thrombocytopenia: Platelets 61 10/02/2014: Held chemotherapy, dose reduction with cycle 3. Platelets 53 10/24/2014: held chemotherapy, dose reduction with cycle 4. In spite of that platelet count has remained low at 45 11/06/2058 4. Fevers 09/18/2014: W/U negative for source of infection, patient has persistent fevers as high as 102.8 and they get better with Tylenol. Fevers have been persistent throughout chemotherapy. Not infectious in  etiology. 5. Anemia due to chemotherapy: symptomatic anemia with hgb 8.2. Improvement to 11.3 after 2 units of blood on 10/23/14, hemoglobin 8.8 11/06/2014 6. Neutropenia: managed with neupogen injections after chemo treatments with cycle 3. But she had intractable back spasms and does not want to use Neupogen anymore. ANC after cycle 4 nadir count 0. I discussed with her about neutropenic precautions. We will await next week to see if she recovers her blood counts to start Abraxane. 7. Headaches: continue tylenol. Use massage or icy hot to shoulders and neck area  Return to clinic in 1 week for cycle 1 Abraxane  No orders of the defined types were placed in this encounter.   The patient has a good understanding of the overall plan. she agrees with it. she will call with any problems that may develop before the next visit here.   Rulon Eisenmenger, MD

## 2014-11-06 NOTE — Assessment & Plan Note (Signed)
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 Adjuvant chemotherapy with dose dense Adriamycin and Cytoxan x 4 followed by Abraxane weekly 12 because of lymph node positive disease. 2. Followed by adjuvant radiation 3. Followed by antiestrogen therapy  Current Treatment: Today is cycle 4 day 8 dose dense Adriamycin and Cytoxan (Prev held chemotherapy for thrombocytopenia, dose reduction for neutropenia)  Chemotoxicities: 1. Hospitalization for neutropenic fever after cycle 1: Blood cultures were all been negative as well as chest x-ray. She was given antibiotics and discharged home. 2. Fatigue: Reduced the dosage of chemotherapy for cycle 2 and further with cycle 3. Will reduce again with 4th cycle. 3. Thrombocytopenia: Platelets 61 10/02/2014: Held chemotherapy, dose reduction with cycle 3. Platelets 53 10/24/2014: held chemotherapy, dose reduction with cycle 4. 4. Fevers 09/18/2014: W/U negative for source of infection, patient has persistent fevers as high as 102.8 and they get better with Tylenol. 5. Anemia due to chemotherapy: symptomatic anemia with hgb 8.2. Improvement to 11.3 after 2 units of blood on 10/23/14 6. Neutropenia: managed with neupogen injections after chemo treatments 7. Headaches: continue tylenol. Use massage or icy hot to shoulders and neck area  Return to clinic in 1 week for cycle 1 Abraxane

## 2014-11-14 ENCOUNTER — Ambulatory Visit (HOSPITAL_BASED_OUTPATIENT_CLINIC_OR_DEPARTMENT_OTHER): Payer: PRIVATE HEALTH INSURANCE | Admitting: Hematology and Oncology

## 2014-11-14 ENCOUNTER — Encounter: Payer: Self-pay | Admitting: *Deleted

## 2014-11-14 ENCOUNTER — Telehealth: Payer: Self-pay | Admitting: Hematology and Oncology

## 2014-11-14 ENCOUNTER — Other Ambulatory Visit: Payer: PRIVATE HEALTH INSURANCE

## 2014-11-14 ENCOUNTER — Encounter: Payer: Self-pay | Admitting: Hematology and Oncology

## 2014-11-14 ENCOUNTER — Ambulatory Visit: Payer: PRIVATE HEALTH INSURANCE | Admitting: Hematology and Oncology

## 2014-11-14 ENCOUNTER — Other Ambulatory Visit (HOSPITAL_BASED_OUTPATIENT_CLINIC_OR_DEPARTMENT_OTHER): Payer: PRIVATE HEALTH INSURANCE

## 2014-11-14 ENCOUNTER — Ambulatory Visit (HOSPITAL_BASED_OUTPATIENT_CLINIC_OR_DEPARTMENT_OTHER): Payer: PRIVATE HEALTH INSURANCE

## 2014-11-14 VITALS — BP 114/75 | HR 92 | Temp 98.9°F | Resp 18 | Ht 65.0 in | Wt 142.7 lb

## 2014-11-14 DIAGNOSIS — C50412 Malignant neoplasm of upper-outer quadrant of left female breast: Secondary | ICD-10-CM

## 2014-11-14 DIAGNOSIS — D696 Thrombocytopenia, unspecified: Secondary | ICD-10-CM

## 2014-11-14 DIAGNOSIS — R530 Neoplastic (malignant) related fatigue: Secondary | ICD-10-CM

## 2014-11-14 DIAGNOSIS — D6481 Anemia due to antineoplastic chemotherapy: Secondary | ICD-10-CM

## 2014-11-14 DIAGNOSIS — Z170 Estrogen receptor positive status [ER+]: Secondary | ICD-10-CM

## 2014-11-14 DIAGNOSIS — C773 Secondary and unspecified malignant neoplasm of axilla and upper limb lymph nodes: Secondary | ICD-10-CM

## 2014-11-14 DIAGNOSIS — C50411 Malignant neoplasm of upper-outer quadrant of right female breast: Secondary | ICD-10-CM

## 2014-11-14 DIAGNOSIS — D701 Agranulocytosis secondary to cancer chemotherapy: Secondary | ICD-10-CM

## 2014-11-14 DIAGNOSIS — Z5111 Encounter for antineoplastic chemotherapy: Secondary | ICD-10-CM

## 2014-11-14 DIAGNOSIS — R51 Headache: Secondary | ICD-10-CM

## 2014-11-14 DIAGNOSIS — R509 Fever, unspecified: Secondary | ICD-10-CM

## 2014-11-14 LAB — CBC WITH DIFFERENTIAL/PLATELET
BASO%: 0.5 % (ref 0.0–2.0)
Basophils Absolute: 0 10*3/uL (ref 0.0–0.1)
EOS%: 0 % (ref 0.0–7.0)
Eosinophils Absolute: 0 10*3/uL (ref 0.0–0.5)
HEMATOCRIT: 28.2 % — AB (ref 34.8–46.6)
HGB: 9 g/dL — ABNORMAL LOW (ref 11.6–15.9)
LYMPH#: 2.2 10*3/uL (ref 0.9–3.3)
LYMPH%: 28.3 % (ref 14.0–49.7)
MCH: 30.3 pg (ref 25.1–34.0)
MCHC: 31.9 g/dL (ref 31.5–36.0)
MCV: 94.9 fL (ref 79.5–101.0)
MONO#: 1.3 10*3/uL — ABNORMAL HIGH (ref 0.1–0.9)
MONO%: 17.2 % — AB (ref 0.0–14.0)
NEUT#: 4.2 10*3/uL (ref 1.5–6.5)
NEUT%: 54 % (ref 38.4–76.8)
Platelets: 191 10*3/uL (ref 145–400)
RBC: 2.97 10*6/uL — ABNORMAL LOW (ref 3.70–5.45)
RDW: 20.6 % — AB (ref 11.2–14.5)
WBC: 7.7 10*3/uL (ref 3.9–10.3)

## 2014-11-14 LAB — COMPREHENSIVE METABOLIC PANEL (CC13)
ALT: 24 U/L (ref 0–55)
AST: 38 U/L — AB (ref 5–34)
Albumin: 3 g/dL — ABNORMAL LOW (ref 3.5–5.0)
Alkaline Phosphatase: 71 U/L (ref 40–150)
Anion Gap: 7 mEq/L (ref 3–11)
BUN: 4 mg/dL — AB (ref 7.0–26.0)
CALCIUM: 8.4 mg/dL (ref 8.4–10.4)
CHLORIDE: 108 meq/L (ref 98–109)
CO2: 28 meq/L (ref 22–29)
CREATININE: 0.5 mg/dL — AB (ref 0.6–1.1)
EGFR: >90 mL/min/{1.73_m2} (ref >90)
GLUCOSE: 99 mg/dL (ref 70–140)
POTASSIUM: 3.1 meq/L — AB (ref 3.5–5.1)
SODIUM: 143 meq/L (ref 136–145)
Total Bilirubin: 0.48 mg/dL (ref 0.20–1.20)
Total Protein: 5.5 g/dL — ABNORMAL LOW (ref 6.4–8.3)

## 2014-11-14 MED ORDER — HEPARIN SOD (PORK) LOCK FLUSH 100 UNIT/ML IV SOLN
500.0000 [IU] | Freq: Once | INTRAVENOUS | Status: AC | PRN
  Administered 2014-11-14: 500 [IU]
  Filled 2014-11-14: qty 5

## 2014-11-14 MED ORDER — SODIUM CHLORIDE 0.9 % IJ SOLN
10.0000 mL | INTRAMUSCULAR | Status: DC | PRN
  Administered 2014-11-14: 10 mL
  Filled 2014-11-14: qty 10

## 2014-11-14 MED ORDER — PALONOSETRON HCL INJECTION 0.25 MG/5ML
0.2500 mg | Freq: Once | INTRAVENOUS | Status: AC
  Administered 2014-11-14: 0.25 mg via INTRAVENOUS

## 2014-11-14 MED ORDER — LORAZEPAM 0.5 MG PO TABS: 0.5000 mg | ORAL_TABLET | Freq: Every day | ORAL | Status: DC

## 2014-11-14 MED ORDER — PALONOSETRON HCL INJECTION 0.25 MG/5ML
INTRAVENOUS | Status: AC
  Filled 2014-11-14: qty 5

## 2014-11-14 MED ORDER — PROCHLORPERAZINE MALEATE 10 MG PO TABS: 10.0000 mg | ORAL_TABLET | Freq: Four times a day (QID) | ORAL | Status: DC | PRN

## 2014-11-14 MED ORDER — ONDANSETRON HCL 8 MG PO TABS: 8.0000 mg | ORAL_TABLET | Freq: Two times a day (BID) | ORAL | Status: DC

## 2014-11-14 MED ORDER — SODIUM CHLORIDE 0.9 % IV SOLN
Freq: Once | INTRAVENOUS | Status: AC
  Administered 2014-11-14: 10:00:00 via INTRAVENOUS

## 2014-11-14 MED ORDER — PACLITAXEL PROTEIN-BOUND CHEMO INJECTION 100 MG
65.0000 mg/m2 | Freq: Once | INTRAVENOUS | Status: AC
  Administered 2014-11-14: 100 mg via INTRAVENOUS
  Filled 2014-11-14: qty 20

## 2014-11-14 NOTE — Telephone Encounter (Signed)
Appointments made and avs printed °

## 2014-11-14 NOTE — Patient Instructions (Signed)

## 2014-11-14 NOTE — Progress Notes (Signed)
Patient Care Team: Chipper Herb, MD as PCP - General (Family Medicine)  DIAGNOSIS: No matching staging information was found for the patient.  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, with LCIS, medial and inferior margins positive, 3/4 lymph nodes positive T2 N1 cM0 stage IIB   08/16/2014 Surgery Left breast medial margin reexcision residual ILC 0.2 cm; inferior margin residual foci less than 0.2 cm, 1/5 lymph nodes positive, 1 lymph node with isolated tumor cells (Overall 4/10)   09/04/2014 -  Chemotherapy Adjuvant chemotherapy with dose dense Adriamycin and Cytoxan 4 followed by Abraxane weekly 12    CHIEF COMPLIANT: cycle 1 of Abraxane  INTERVAL HISTORY: [name] is a 54 year old lady with above-mentioned history of left breast cancer currently on adjuvant chemotherapy. Today is cycle 1 of Abraxane. She had persistent fevers lasting 3-4 days after the last chemotherapy. It ran up 102 203. Based on my previous experience with her I decided to not give her antibiotics and decided not to have her come in through the emergency room. The fevers went down on their own spontaneously and today she feels very well. She denies any problems or concerns. Sharp major complaints are fatigue. She has been eating fairly well. She had diarrhea for 2-3 days as well as the last week which she attributes it to an outside food  REVIEW OF SYSTEMS:   Constitutional: Denies fevers,  chills or abnormal weight loss, intermittent fevers Eyes: Denies blurriness of vision Ears, nose, mouth, throat, and face: Denies mucositis or sore throat Respiratory: Denies cough, dyspnea or wheezes Cardiovascular: Denies palpitation, chest discomfort or lower extremity swelling Gastrointestinal:  diarrhea Skin: Denies abnormal skin rashes Lymphatics: Denies new lymphadenopathy or easy bruising Neurological:Denies numbness, tingling or new weaknesses Behavioral/Psych: Mood is stable, no new changes  All other systems were reviewed with the patient and are negative.  I have reviewed the past medical history, past surgical history, social history and family history with the patient and they are unchanged from previous note.  ALLERGIES:  is allergic to crestor; lipitor; and temazepam.  MEDICATIONS:  Current Outpatient Prescriptions  Medication Sig Dispense Refill  . acetaminophen (TYLENOL) 325 MG tablet Take 650 mg by mouth every 6 (six) hours as needed for moderate pain.    . clonazePAM (KLONOPIN) 0.5 MG tablet TAKE 1 TABLET THREE TIMES A DAY 90 tablet 2  . hydroxypropyl methylcellulose / hypromellose (ISOPTO TEARS / GONIOVISC) 2.5 % ophthalmic solution Place 1 drop into both eyes 3 (three) times daily as needed for dry eyes.    Marland Kitchen LORazepam (ATIVAN) 0.5 MG tablet Take 1 tablet (0.5 mg total) by mouth at bedtime. 30 tablet 0  . PARoxetine (PAXIL-CR) 25 MG 24 hr tablet TAKE 1 TABLET BY MOUTH EVERY DAY 90 tablet 2   No current facility-administered medications for this visit.    PHYSICAL EXAMINATION: ECOG PERFORMANCE STATUS: 1 - Symptomatic but completely ambulatory  Filed Vitals:   11/14/14  0852  BP: 114/75  Pulse: 92  Temp: 98.9 F (37.2 C)  Resp: 18   Filed Weights   11/14/14 0852  Weight: 142 lb 11.2 oz (64.728 kg)    GENERAL:alert, no distress and comfortable SKIN: skin color, texture, turgor are normal, no rashes or significant lesions EYES: normal, Conjunctiva are  pink and non-injected, sclera clear OROPHARYNX:no exudate, no erythema and lips, buccal mucosa, and tongue normal  NECK: supple, thyroid normal size, non-tender, without nodularity LYMPH:  no palpable lymphadenopathy in the cervical, axillary or inguinal LUNGS: clear to auscultation and percussion with normal breathing effort HEART: regular rate & rhythm and no murmurs and no lower extremity edema ABDOMEN:abdomen soft, non-tender and normal bowel sounds Musculoskeletal:no cyanosis of digits and no clubbing  NEURO: alert & oriented x 3 with fluent speech, no focal motor/sensory deficits  LABORATORY DATA:  I have reviewed the data as listed   Chemistry      Component Value Date/Time   NA 143 11/14/2014 0836   NA 132* 09/09/2014 2336   NA 141 07/21/2013 1023   K 3.1* 11/14/2014 0836   K 3.6 09/09/2014 2336   CL 99* 09/09/2014 2336   CO2 28 11/14/2014 0836   CO2 25 09/09/2014 2336   BUN 4.0* 11/14/2014 0836   BUN 15 09/09/2014 2336   BUN 14 07/21/2013 1023   CREATININE 0.5* 11/14/2014 0836   CREATININE 0.43* 09/09/2014 2336   CREATININE 0.54 10/04/2012 1123      Component Value Date/Time   CALCIUM 8.4 11/14/2014 0836   CALCIUM 8.2* 09/09/2014 2336   ALKPHOS 71 11/14/2014 0836   ALKPHOS 70 09/09/2014 2336   AST 38* 11/14/2014 0836   AST 19 09/09/2014 2336   ALT 24 11/14/2014 0836   ALT 26 09/09/2014 2336   BILITOT 0.48 11/14/2014 0836   BILITOT 0.8 09/09/2014 2336       Lab Results  Component Value Date   WBC 7.7 11/14/2014   HGB 9.0* 11/14/2014   HCT 28.2* 11/14/2014   MCV 94.9 11/14/2014   PLT 191 11/14/2014   NEUTROABS 4.2 11/14/2014    ASSESSMENT & PLAN:  Breast cancer of upper-outer quadrant of left female breast Left breast lumpectomy 08/08/2014: 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, with LCIS, medial and inferior margins positive, 3/4 lymph nodes positive T2 N1 cM0 stage IIB, ER 86%, PR 78%, HER-2/neu negative ratio 1.77, Ki-67 14% Left breast medial  margin reexcision 08/18/14: residual ILC 0.2 cm; inferior margin residual foci less than 0.2 cm, 1/5 lymph nodes positive, 1 lymph node with isolated tumor cells (Overall 4/10)  Treatment Plan: 1. Adjuvant chemotherapy with dose dense Adriamycin and Cytoxan x 4 followed by Abraxane weekly 12 because of lymph node positive disease. 2. Followed by adjuvant radiation 3. Followed by antiestrogen therapy  Current Treatment: completed 4 cycles of dose dense Adriamycin and Cytoxan (Prev held chemotherapy for thrombocytopenia, dose reduction for neutropenia), today is cycle 1 Abraxane weekly 12  Chemotoxicities: 1. Hospitalization for neutropenic fever after cycle 1: Blood cultures were all been negative as well as chest x-ray. She was given antibiotics and discharged home. 2. Fatigue: Reduced the dosage of chemotherapy for cycle 2 and further with cycle 3. Will reduce again with 4th cycle. 3. Thrombocytopenia: Platelets 61 10/02/2014: Held chemotherapy, dose reduction with cycle 3. Platelets 53 10/24/2014: held chemotherapy, dose reduction with cycle 4. In spite of that platelet count has remained low at 45 11/06/2058 4. Fevers 09/18/2014: W/U negative for source of infection,  patient has persistent fevers as high as 102.8 and they get better with Tylenol. Fevers have been persistent throughout chemotherapy. Not infectious in etiology. 5. Anemia due to chemotherapy: symptomatic anemia with hgb 8.2. Improvement to 11.3 after 2 units of blood on 10/23/14, hemoglobin 9 on 11/14/2014 6. Neutropenia: managed with neupogen injections after chemo treatments with cycle 3. But she had intractable back spasms and does not want to use Neupogen anymore. 7. Headaches: continue tylenol. Use massage or icy hot to shoulders and neck area  Return to clinic in 1 week for cycle 2/12 Abraxane and for toxicity check   No orders of the defined types were placed in this encounter.   The patient has a good understanding of  the overall plan. she agrees with it. she will call with any problems that may develop before the next visit here.   Rulon Eisenmenger, MD

## 2014-11-14 NOTE — Assessment & Plan Note (Signed)
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 Adjuvant chemotherapy with dose dense Adriamycin and Cytoxan x 4 followed by Abraxane weekly 12 because of lymph node positive disease. 2. Followed by adjuvant radiation 3. Followed by antiestrogen therapy  Current Treatment: completed 4 cycles of dose dense Adriamycin and Cytoxan (Prev held chemotherapy for thrombocytopenia, dose reduction for neutropenia), today is cycle 1 Abraxane weekly 12  Chemotoxicities: 1. Hospitalization for neutropenic fever after cycle 1: Blood cultures were all been negative as well as chest x-ray. She was given antibiotics and discharged home. 2. Fatigue: Reduced the dosage of chemotherapy for cycle 2 and further with cycle 3. Will reduce again with 4th cycle. 3. Thrombocytopenia: Platelets 61 10/02/2014: Held chemotherapy, dose reduction with cycle 3. Platelets 53 10/24/2014: held chemotherapy, dose reduction with cycle 4. In spite of that platelet count has remained low at 45 11/06/2058 4. Fevers 09/18/2014: W/U negative for source of infection, patient has persistent fevers as high as 102.8 and they get better with Tylenol. Fevers have been persistent throughout chemotherapy. Not infectious in etiology. 5. Anemia due to chemotherapy: symptomatic anemia with hgb 8.2. Improvement to 11.3 after 2 units of blood on 10/23/14, hemoglobin 8.8 11/06/2014 6. Neutropenia: managed with neupogen injections after chemo treatments with cycle 3. But she had intractable back spasms and does not want to use Neupogen anymore. 7. Headaches:  continue tylenol. Use massage or icy hot to shoulders and neck area  Return to clinic in 1 week for cycle 2/12 Abraxane and for toxicity check

## 2014-11-17 ENCOUNTER — Other Ambulatory Visit: Payer: Self-pay

## 2014-11-17 DIAGNOSIS — C50412 Malignant neoplasm of upper-outer quadrant of left female breast: Secondary | ICD-10-CM

## 2014-11-20 ENCOUNTER — Other Ambulatory Visit (HOSPITAL_BASED_OUTPATIENT_CLINIC_OR_DEPARTMENT_OTHER): Payer: PRIVATE HEALTH INSURANCE

## 2014-11-20 ENCOUNTER — Ambulatory Visit (HOSPITAL_BASED_OUTPATIENT_CLINIC_OR_DEPARTMENT_OTHER): Payer: PRIVATE HEALTH INSURANCE

## 2014-11-20 ENCOUNTER — Encounter: Payer: Self-pay | Admitting: Hematology and Oncology

## 2014-11-20 ENCOUNTER — Telehealth: Payer: Self-pay

## 2014-11-20 ENCOUNTER — Ambulatory Visit (HOSPITAL_BASED_OUTPATIENT_CLINIC_OR_DEPARTMENT_OTHER): Payer: PRIVATE HEALTH INSURANCE | Admitting: Hematology and Oncology

## 2014-11-20 VITALS — BP 116/65 | HR 108 | Temp 98.7°F | Resp 18 | Ht 65.0 in | Wt 144.6 lb

## 2014-11-20 DIAGNOSIS — D6481 Anemia due to antineoplastic chemotherapy: Secondary | ICD-10-CM

## 2014-11-20 DIAGNOSIS — C50412 Malignant neoplasm of upper-outer quadrant of left female breast: Secondary | ICD-10-CM

## 2014-11-20 DIAGNOSIS — D701 Agranulocytosis secondary to cancer chemotherapy: Secondary | ICD-10-CM

## 2014-11-20 DIAGNOSIS — D696 Thrombocytopenia, unspecified: Secondary | ICD-10-CM

## 2014-11-20 DIAGNOSIS — Z170 Estrogen receptor positive status [ER+]: Secondary | ICD-10-CM | POA: Diagnosis not present

## 2014-11-20 DIAGNOSIS — Z5112 Encounter for antineoplastic immunotherapy: Secondary | ICD-10-CM | POA: Diagnosis not present

## 2014-11-20 DIAGNOSIS — R530 Neoplastic (malignant) related fatigue: Secondary | ICD-10-CM

## 2014-11-20 DIAGNOSIS — C773 Secondary and unspecified malignant neoplasm of axilla and upper limb lymph nodes: Secondary | ICD-10-CM

## 2014-11-20 DIAGNOSIS — R509 Fever, unspecified: Secondary | ICD-10-CM

## 2014-11-20 DIAGNOSIS — R51 Headache: Secondary | ICD-10-CM

## 2014-11-20 LAB — CBC WITH DIFFERENTIAL/PLATELET
BASO%: 1.3 % (ref 0.0–2.0)
BASOS ABS: 0 10*3/uL (ref 0.0–0.1)
EOS ABS: 0 10*3/uL (ref 0.0–0.5)
EOS%: 0 % (ref 0.0–7.0)
HEMATOCRIT: 27.6 % — AB (ref 34.8–46.6)
HGB: 8.8 g/dL — ABNORMAL LOW (ref 11.6–15.9)
LYMPH%: 33.7 % (ref 14.0–49.7)
MCH: 30.7 pg (ref 25.1–34.0)
MCHC: 31.9 g/dL (ref 31.5–36.0)
MCV: 96.2 fL (ref 79.5–101.0)
MONO#: 0.6 10*3/uL (ref 0.1–0.9)
MONO%: 17.8 % — AB (ref 0.0–14.0)
NEUT%: 47.2 % (ref 38.4–76.8)
NEUTROS ABS: 1.5 10*3/uL (ref 1.5–6.5)
PLATELETS: 177 10*3/uL (ref 145–400)
RBC: 2.87 10*6/uL — ABNORMAL LOW (ref 3.70–5.45)
RDW: 23.3 % — ABNORMAL HIGH (ref 11.2–14.5)
WBC: 3.1 10*3/uL — ABNORMAL LOW (ref 3.9–10.3)
lymph#: 1 10*3/uL (ref 0.9–3.3)
nRBC: 2 % — ABNORMAL HIGH (ref 0–0)

## 2014-11-20 LAB — COMPREHENSIVE METABOLIC PANEL (CC13)
ALT: 26 U/L (ref 0–55)
ANION GAP: 9 meq/L (ref 3–11)
AST: 43 U/L — ABNORMAL HIGH (ref 5–34)
Albumin: 3.1 g/dL — ABNORMAL LOW (ref 3.5–5.0)
Alkaline Phosphatase: 77 U/L (ref 40–150)
BUN: 6.9 mg/dL — ABNORMAL LOW (ref 7.0–26.0)
CALCIUM: 8.2 mg/dL — AB (ref 8.4–10.4)
CHLORIDE: 108 meq/L (ref 98–109)
CO2: 25 mEq/L (ref 22–29)
CREATININE: 0.5 mg/dL — AB (ref 0.6–1.1)
Glucose: 118 mg/dl (ref 70–140)
Potassium: 3.3 mEq/L — ABNORMAL LOW (ref 3.5–5.1)
Sodium: 142 mEq/L (ref 136–145)
Total Bilirubin: 0.7 mg/dL (ref 0.20–1.20)
Total Protein: 5.5 g/dL — ABNORMAL LOW (ref 6.4–8.3)

## 2014-11-20 MED ORDER — HEPARIN SOD (PORK) LOCK FLUSH 100 UNIT/ML IV SOLN
500.0000 [IU] | Freq: Once | INTRAVENOUS | Status: AC | PRN
  Administered 2014-11-20: 500 [IU]
  Filled 2014-11-20: qty 5

## 2014-11-20 MED ORDER — PALONOSETRON HCL INJECTION 0.25 MG/5ML
INTRAVENOUS | Status: AC
  Filled 2014-11-20: qty 5

## 2014-11-20 MED ORDER — PACLITAXEL PROTEIN-BOUND CHEMO INJECTION 100 MG
65.0000 mg/m2 | Freq: Once | INTRAVENOUS | Status: AC
  Administered 2014-11-20: 100 mg via INTRAVENOUS
  Filled 2014-11-20: qty 20

## 2014-11-20 MED ORDER — SODIUM CHLORIDE 0.9 % IV SOLN
Freq: Once | INTRAVENOUS | Status: AC
  Administered 2014-11-20: 09:00:00 via INTRAVENOUS

## 2014-11-20 MED ORDER — SODIUM CHLORIDE 0.9 % IJ SOLN
10.0000 mL | INTRAMUSCULAR | Status: DC | PRN
  Administered 2014-11-20: 10 mL
  Filled 2014-11-20: qty 10

## 2014-11-20 MED ORDER — PALONOSETRON HCL INJECTION 0.25 MG/5ML
0.2500 mg | Freq: Once | INTRAVENOUS | Status: AC
  Administered 2014-11-20: 0.25 mg via INTRAVENOUS

## 2014-11-20 NOTE — Assessment & Plan Note (Signed)
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 Adjuvant chemotherapy with dose dense Adriamycin and Cytoxan x 4 followed by Abraxane weekly 12 because of lymph node positive disease. 2. Followed by adjuvant radiation 3. Followed by antiestrogen therapy  Current Treatment: completed 4 cycles of dose dense Adriamycin and Cytoxan (Prev held chemotherapy for thrombocytopenia, dose reduction for neutropenia), today is cycle 2 Abraxane weekly 12  Chemotoxicities: 1. Hospitalization for neutropenic fever after cycle 1: Blood cultures were all been negative as well as chest x-ray. She was given antibiotics and discharged home. 2. Fatigue: Reduced the dosage of chemotherapy for cycle 2 and further with cycle 3. Will reduce again with 4th cycle. 3. Thrombocytopenia: Platelets 61 10/02/2014: Held chemotherapy, dose reduction with cycle 3. Platelets 53 10/24/2014: held chemotherapy, dose reduction with cycle 4. In spite of that platelet count has remained low at 45 11/06/2058 4. Fevers 09/18/2014: W/U negative for source of infection, patient has persistent fevers as high as 102.8 and they get better with Tylenol. Fevers have been persistent throughout chemotherapy. Not infectious in etiology. 5. Anemia due to chemotherapy: symptomatic anemia with hgb 8.2. Improvement to 11.3 after 2 units of blood on 10/23/14, hemoglobin 9 on 11/14/2014 6. Neutropenia: managed with neupogen injections after chemo treatments with cycle 3. But she had intractable back spasms and does not want to use Neupogen anymore. 7. Headaches:  continue tylenol. Use massage or icy hot to shoulders and neck area  Return to clinic in 2 week for cycle 4/12 Abraxane and for toxicity check

## 2014-11-20 NOTE — Telephone Encounter (Signed)
-----   Message from Cora Collum, RN sent at 11/14/2014 11:10 AM EDT ----- Regarding: Dr. Lindi Adie, Chemo follow up call 1st time Abraxane, Dr. Lindi Adie

## 2014-11-20 NOTE — Progress Notes (Signed)
Patient Care Team: Chipper Herb, MD as PCP - General (Family Medicine)  DIAGNOSIS: No matching staging information was found for the patient.  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, with LCIS, medial and inferior margins positive, 3/4 lymph nodes positive T2 N1 cM0 stage IIB   08/16/2014 Surgery Left breast medial margin reexcision residual ILC 0.2 cm; inferior margin residual foci less than 0.2 cm, 1/5 lymph nodes positive, 1 lymph node with isolated tumor cells (Overall 4/10)   09/04/2014 -  Chemotherapy Adjuvant chemotherapy with dose dense Adriamycin and Cytoxan 4 followed by Abraxane weekly 12    CHIEF COMPLIANT: Abraxane week 2/12  INTERVAL HISTORY: [name] is a 54 year old with above-mentioned history of left breast cancer currently on adjuvant chemotherapy today is week 2 of Abraxane. She tolerated Abraxane week one reasonably well. She does feel fatigued throughout the week. She had one day of temperature of 102 which subsided with 3 Tylenol tablets. She did not have much nausea vomiting. She took 1 tablet of Zofran. Denies any abdominal pain or diarrhea. Denies any neuropathy.  REVIEW OF SYSTEMS:   Constitutional: Denies fevers, chills or abnormal weight loss, complains of fatigue Eyes: Denies blurriness of vision Ears, nose, mouth, throat, and face: Denies mucositis or sore throat Respiratory: Denies cough, dyspnea or wheezes Cardiovascular: Denies  palpitation, chest discomfort or lower extremity swelling Gastrointestinal:  Denies nausea, heartburn or change in bowel habits Skin: Denies abnormal skin rashes Lymphatics: Denies new lymphadenopathy or easy bruising Neurological:Denies numbness, tingling or new weaknesses Behavioral/Psych: Mood is stable, no new changes   All other systems were reviewed with the patient and are negative.  I have reviewed the past medical history, past surgical history, social history and family history with the patient and they are unchanged from previous note.  ALLERGIES:  is allergic to crestor; lipitor; and temazepam.  MEDICATIONS:  Current Outpatient Prescriptions  Medication Sig Dispense Refill  . acetaminophen (TYLENOL) 325 MG tablet Take 650 mg by mouth every 6 (six) hours as needed for moderate pain.    . clonazePAM (KLONOPIN) 0.5 MG tablet TAKE 1 TABLET THREE TIMES A DAY 90 tablet 2  . dexamethasone (DECADRON) 4 MG tablet TAKE 1 TAB ONCE A DAY ON THE DAY AFTER CHEMO. THEN TAKE 1 TAB TWICE DAILY FOR 2DAYS. TAKE WITH FOOD  1  . hydroxypropyl methylcellulose / hypromellose (ISOPTO TEARS / GONIOVISC) 2.5 % ophthalmic solution Place 1 drop into both eyes 3 (three) times daily as needed for dry eyes.    Marland Kitchen lidocaine-prilocaine (EMLA) cream APPLY TO AFFECTED AREA ONCE  3  . LORazepam (ATIVAN) 0.5 MG tablet Take 1 tablet (0.5 mg total) by mouth at bedtime. 30 tablet 0  . ondansetron (ZOFRAN) 8 MG tablet Take 1 tablet (8 mg total) by mouth 2 (two) times daily. Start the day after chemo for 2 days. Then take as needed for nausea or vomiting. 30 tablet 1  . PARoxetine (  PAXIL-CR) 25 MG 24 hr tablet TAKE 1 TABLET BY MOUTH EVERY DAY 90 tablet 2  . prochlorperazine (COMPAZINE) 10 MG tablet Take 1 tablet (10 mg total) by mouth every 6 (six) hours as needed (Nausea or vomiting). 30 tablet 1   No current facility-administered medications for this visit.    PHYSICAL EXAMINATION: ECOG PERFORMANCE STATUS: 1 -  Symptomatic but completely ambulatory  Filed Vitals:   11/20/14 0809  BP: 116/65  Pulse: 108  Temp: 98.7 F (37.1 C)  Resp: 18   Filed Weights   11/20/14 0809  Weight: 144 lb 9.6 oz (65.59 kg)    GENERAL:alert, no distress and comfortable SKIN: skin color, texture, turgor are normal, no rashes or significant lesions EYES: normal, Conjunctiva are pink and non-injected, sclera clear OROPHARYNX:no exudate, no erythema and lips, buccal mucosa, and tongue normal  NECK: supple, thyroid normal size, non-tender, without nodularity LYMPH:  no palpable lymphadenopathy in the cervical, axillary or inguinal LUNGS: clear to auscultation and percussion with normal breathing effort HEART: regular rate & rhythm and no murmurs and no lower extremity edema ABDOMEN:abdomen soft, non-tender and normal bowel sounds Musculoskeletal:no cyanosis of digits and no clubbing  NEURO: alert & oriented x 3 with fluent speech, no focal motor/sensory deficits  LABORATORY DATA:  I have reviewed the data as listed   Chemistry      Component Value Date/Time   NA 143 11/14/2014 0836   NA 132* 09/09/2014 2336   NA 141 07/21/2013 1023   K 3.1* 11/14/2014 0836   K 3.6 09/09/2014 2336   CL 99* 09/09/2014 2336   CO2 28 11/14/2014 0836   CO2 25 09/09/2014 2336   BUN 4.0* 11/14/2014 0836   BUN 15 09/09/2014 2336   BUN 14 07/21/2013 1023   CREATININE 0.5* 11/14/2014 0836   CREATININE 0.43* 09/09/2014 2336   CREATININE 0.54 10/04/2012 1123      Component Value Date/Time   CALCIUM 8.4 11/14/2014 0836   CALCIUM 8.2* 09/09/2014 2336   ALKPHOS 71 11/14/2014 0836   ALKPHOS 70 09/09/2014 2336   AST 38* 11/14/2014 0836   AST 19 09/09/2014 2336   ALT 24 11/14/2014 0836   ALT 26 09/09/2014 2336   BILITOT 0.48 11/14/2014 0836   BILITOT 0.8 09/09/2014 2336       Lab Results  Component Value Date   WBC 3.1* 11/20/2014   HGB 8.8* 11/20/2014   HCT 27.6* 11/20/2014   MCV 96.2 11/20/2014   PLT 177 11/20/2014    NEUTROABS 1.5 11/20/2014   ASSESSMENT & PLAN:  Breast cancer of upper-outer quadrant of left female breast Left breast lumpectomy 08/08/2014: 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 Adjuvant chemotherapy with dose dense Adriamycin and Cytoxan x 4 followed by Abraxane weekly 12 because of lymph node positive disease. 2. Followed by adjuvant radiation 3. Followed by antiestrogen therapy  Current Treatment: completed 4 cycles of dose dense Adriamycin and Cytoxan (Prev held chemotherapy for thrombocytopenia, dose reduction for neutropenia), today is cycle 2 Abraxane weekly 12  Chemotoxicities: 1. Hospitalization for neutropenic fever after cycle 1: Blood cultures were all been negative as well as chest x-ray.  She was given antibiotics and discharged home. 2. Fatigue: Reduced the dosage of chemotherapy for cycle 2 and further with cycle 3. Will reduce again with 4th cycle. 3. Thrombocytopenia: Platelets 61 10/02/2014: Held chemotherapy, dose reduction with cycle 3. Platelets 53 10/24/2014: held chemotherapy, dose reduction with cycle 4. In spite of that platelet count has remained low at 45 11/06/2058 4. Fevers 09/18/2014: W/U negative for source of infection, patient has persistent fevers as high as 102.8 and they get better with Tylenol. Fevers have been persistent throughout chemotherapy. Not infectious in etiology. 5. Anemia due to chemotherapy: symptomatic anemia with hgb 8.2. Improvement to 11.3 after 2 units of blood on 10/23/14, hemoglobin 9 on 11/14/2014 6. Neutropenia: managed with neupogen injections after chemo treatments  with cycle 3. But she had intractable back spasms and does not want to use Neupogen anymore. 7. Headaches: continue tylenol. Use massage or icy hot to shoulders and neck area  Monitoring very closely for toxicities. Patient understands that we might have to do dose reductions if her neutrophil count does not stay within the range for treatment. Return to clinic in 1 week for cycle 3/12 Abraxane and for toxicity check  No orders of the defined types were placed in this encounter.   The patient has a good understanding of the overall plan. she agrees with it. she will call with any problems that may develop before the next visit here.   Rulon Eisenmenger, MD

## 2014-11-20 NOTE — Patient Instructions (Addendum)
[facility] Discharge Instructions for Patients Receiving Chemotherapy  Today you received the following chemotherapy agents:  Abraxane  To help prevent nausea and vomiting after your treatment, we encourage you to take your nausea medication as ordered per MD.   If you develop nausea and vomiting that is not controlled by your nausea medication, call the clinic.   BELOW ARE SYMPTOMS THAT SHOULD BE REPORTED IMMEDIATELY:  *FEVER GREATER THAN 100.5 F  *CHILLS WITH OR WITHOUT FEVER  NAUSEA AND VOMITING THAT IS NOT CONTROLLED WITH YOUR NAUSEA MEDICATION  *UNUSUAL SHORTNESS OF BREATH  *UNUSUAL BRUISING OR BLEEDING  TENDERNESS IN MOUTH AND THROAT WITH OR WITHOUT PRESENCE OF ULCERS  *URINARY PROBLEMS  *BOWEL PROBLEMS  UNUSUAL RASH Items with * indicate a potential emergency and should be followed up as soon as possible.  Feel free to call the clinic you have any questions or concerns. The clinic phone number is [phone].  Please show the [facility] at check-in to the Emergency Department and triage nurse.  Nanoparticle Albumin-Bound Paclitaxel injection What is this medicine? NANOPARTICLE ALBUMIN-BOUND PACLITAXEL (Na no PAHR ti kuhl al BYOO muhn-bound PAK li TAX el) is a chemotherapy drug. It targets fast dividing cells, like cancer cells, and causes these cells to die. This medicine is used to treat advanced breast cancer and advanced lung cancer. This medicine may be used for other purposes; ask your health care provider or pharmacist if you have questions. COMMON BRAND NAME(S): Abraxane What should I tell my health care provider before I take this medicine? They need to know if you have any of these conditions: -kidney disease -liver disease -low blood counts, like low platelets, red blood cells, or white blood cells -recent or ongoing radiation therapy -an unusual or allergic reaction to paclitaxel, albumin, other chemotherapy, other medicines,  foods, dyes, or preservatives -pregnant or trying to get pregnant -breast-feeding How should I use this medicine? This drug is given as an infusion into a vein. It is administered in a hospital or clinic by a specially trained health care professional. Talk to your pediatrician regarding the use of this medicine in children. Special care may be needed. Overdosage: If you think you have taken too much of this medicine contact a poison control center or emergency room at once. NOTE: This medicine is only for you. Do not share this medicine with others. What if I miss a dose? It is important not to miss your dose. Call your doctor or health care professional if you are unable to keep an appointment. What may interact with this medicine? -cyclosporine -diazepam -ketoconazole -medicines to increase blood counts like filgrastim, pegfilgrastim, sargramostim -other chemotherapy drugs like cisplatin, doxorubicin, epirubicin, etoposide, teniposide, vincristine -quinidine -testosterone -vaccines -verapamil Talk to your doctor or health care professional before taking any of these medicines: -acetaminophen -aspirin -ibuprofen -ketoprofen -naproxen This list may not describe all possible interactions. Give your health care provider a list of all the medicines, herbs, non-prescription drugs, or dietary supplements you use. Also tell them if you smoke, drink alcohol, or use illegal drugs. Some items may interact with your medicine. What should I watch for while using this medicine? Your condition will be monitored carefully while you are receiving this medicine. You will need important blood work done while you are taking this medicine. This drug may make you feel generally unwell. This is not uncommon, as chemotherapy can affect healthy cells as well as cancer cells. Report any side effects. Continue your course  of treatment even though you feel ill unless your doctor tells you to stop. In some  cases, you may be given additional medicines to help with side effects. Follow all directions for their use. Call your doctor or health care professional for advice if you get a fever, chills or sore throat, or other symptoms of a cold or flu. Do not treat yourself. This drug decreases your body's ability to fight infections. Try to avoid being around people who are sick. This medicine may increase your risk to bruise or bleed. Call your doctor or health care professional if you notice any unusual bleeding. Be careful brushing and flossing your teeth or using a toothpick because you may get an infection or bleed more easily. If you have any dental work done, tell your dentist you are receiving this medicine. Avoid taking products that contain aspirin, acetaminophen, ibuprofen, naproxen, or ketoprofen unless instructed by your doctor. These medicines may hide a fever. Do not become pregnant while taking this medicine. Women should inform their doctor if they wish to become pregnant or think they might be pregnant. There is a potential for serious side effects to an unborn child. Talk to your health care professional or pharmacist for more information. Do not breast-feed an infant while taking this medicine. Men are advised not to father a child while receiving this medicine. What side effects may I notice from receiving this medicine? Side effects that you should report to your doctor or health care professional as soon as possible: -allergic reactions like skin rash, itching or hives, swelling of the face, lips, or tongue -low blood counts - This drug may decrease the number of white blood cells, red blood cells and platelets. You may be at increased risk for infections and bleeding. -signs of infection - fever or chills, cough, sore throat, pain or difficulty passing urine -signs of decreased platelets or bleeding - bruising, pinpoint red spots on the skin, black, tarry stools, nosebleeds -signs of  decreased red blood cells - unusually weak or tired, fainting spells, lightheadedness -breathing problems -changes in vision -chest pain -high or low blood pressure -mouth sores -nausea and vomiting -pain, swelling, redness or irritation at the injection site -pain, tingling, numbness in the hands or feet -slow or irregular heartbeat -swelling of the ankle, feet, hands Side effects that usually do not require medical attention (report to your doctor or health care professional if they continue or are bothersome): -aches, pains -changes in the color of fingernails -diarrhea -hair loss -loss of appetite This list may not describe all possible side effects. Call your doctor for medical advice about side effects. You may report side effects to FDA at 1-800-FDA-1088. Where should I keep my medicine? This drug is given in a hospital or clinic and will not be stored at home. NOTE: This sheet is a summary. It may not cover all possible information. If you have questions about this medicine, talk to your doctor, pharmacist, or health care provider.  2015, [employer] Standard. (2012-04-19 16:48:50)

## 2014-11-20 NOTE — Telephone Encounter (Signed)
Pt receiving 2nd dose chemo today. Infusion nurse will assess.

## 2014-11-21 ENCOUNTER — Other Ambulatory Visit: Payer: PRIVATE HEALTH INSURANCE

## 2014-11-21 ENCOUNTER — Ambulatory Visit: Payer: PRIVATE HEALTH INSURANCE | Admitting: Hematology and Oncology

## 2014-11-27 ENCOUNTER — Ambulatory Visit (HOSPITAL_BASED_OUTPATIENT_CLINIC_OR_DEPARTMENT_OTHER): Payer: PRIVATE HEALTH INSURANCE | Admitting: Hematology and Oncology

## 2014-11-27 ENCOUNTER — Other Ambulatory Visit (HOSPITAL_BASED_OUTPATIENT_CLINIC_OR_DEPARTMENT_OTHER): Payer: PRIVATE HEALTH INSURANCE

## 2014-11-27 ENCOUNTER — Ambulatory Visit (HOSPITAL_BASED_OUTPATIENT_CLINIC_OR_DEPARTMENT_OTHER): Payer: PRIVATE HEALTH INSURANCE

## 2014-11-27 ENCOUNTER — Encounter: Payer: Self-pay | Admitting: Hematology and Oncology

## 2014-11-27 ENCOUNTER — Telehealth: Payer: Self-pay | Admitting: Hematology and Oncology

## 2014-11-27 VITALS — BP 130/72 | HR 94 | Temp 98.5°F | Resp 18 | Ht 65.0 in | Wt 142.4 lb

## 2014-11-27 DIAGNOSIS — Z5111 Encounter for antineoplastic chemotherapy: Secondary | ICD-10-CM | POA: Diagnosis not present

## 2014-11-27 DIAGNOSIS — C50412 Malignant neoplasm of upper-outer quadrant of left female breast: Secondary | ICD-10-CM

## 2014-11-27 DIAGNOSIS — D6959 Other secondary thrombocytopenia: Secondary | ICD-10-CM | POA: Diagnosis not present

## 2014-11-27 DIAGNOSIS — T451X5A Adverse effect of antineoplastic and immunosuppressive drugs, initial encounter: Secondary | ICD-10-CM

## 2014-11-27 DIAGNOSIS — D6481 Anemia due to antineoplastic chemotherapy: Secondary | ICD-10-CM | POA: Diagnosis not present

## 2014-11-27 LAB — CBC WITH DIFFERENTIAL/PLATELET
BASO%: 0.7 % (ref 0.0–2.0)
Basophils Absolute: 0 10*3/uL (ref 0.0–0.1)
EOS ABS: 0 10*3/uL (ref 0.0–0.5)
EOS%: 0 % (ref 0.0–7.0)
HCT: 28.6 % — ABNORMAL LOW (ref 34.8–46.6)
HEMOGLOBIN: 9.3 g/dL — AB (ref 11.6–15.9)
LYMPH%: 42.9 % (ref 14.0–49.7)
MCH: 32.2 pg (ref 25.1–34.0)
MCHC: 32.5 g/dL (ref 31.5–36.0)
MCV: 99 fL (ref 79.5–101.0)
MONO#: 1.1 10*3/uL — AB (ref 0.1–0.9)
MONO%: 24 % — AB (ref 0.0–14.0)
NEUT%: 32.4 % — ABNORMAL LOW (ref 38.4–76.8)
NEUTROS ABS: 1.4 10*3/uL — AB (ref 1.5–6.5)
Platelets: 187 10*3/uL (ref 145–400)
RBC: 2.89 10*6/uL — ABNORMAL LOW (ref 3.70–5.45)
RDW: 24.8 % — AB (ref 11.2–14.5)
WBC: 4.5 10*3/uL (ref 3.9–10.3)
lymph#: 1.9 10*3/uL (ref 0.9–3.3)

## 2014-11-27 LAB — COMPREHENSIVE METABOLIC PANEL (CC13)
ALBUMIN: 3.4 g/dL — AB (ref 3.5–5.0)
ALK PHOS: 77 U/L (ref 40–150)
ALT: 24 U/L (ref 0–55)
AST: 35 U/L — ABNORMAL HIGH (ref 5–34)
Anion Gap: 9 mEq/L (ref 3–11)
BILIRUBIN TOTAL: 0.67 mg/dL (ref 0.20–1.20)
BUN: 5.4 mg/dL — AB (ref 7.0–26.0)
CO2: 24 mEq/L (ref 22–29)
CREATININE: 0.6 mg/dL (ref 0.6–1.1)
Calcium: 8.5 mg/dL (ref 8.4–10.4)
Chloride: 109 mEq/L (ref 98–109)
GLUCOSE: 98 mg/dL (ref 70–140)
Potassium: 3.4 mEq/L — ABNORMAL LOW (ref 3.5–5.1)
SODIUM: 142 meq/L (ref 136–145)
TOTAL PROTEIN: 5.9 g/dL — AB (ref 6.4–8.3)

## 2014-11-27 MED ORDER — PALONOSETRON HCL INJECTION 0.25 MG/5ML
0.2500 mg | Freq: Once | INTRAVENOUS | Status: AC
  Administered 2014-11-27: 0.25 mg via INTRAVENOUS

## 2014-11-27 MED ORDER — PACLITAXEL PROTEIN-BOUND CHEMO INJECTION 100 MG
65.0000 mg/m2 | Freq: Once | INTRAVENOUS | Status: AC
  Administered 2014-11-27: 100 mg via INTRAVENOUS
  Filled 2014-11-27: qty 20

## 2014-11-27 MED ORDER — HEPARIN SOD (PORK) LOCK FLUSH 100 UNIT/ML IV SOLN
500.0000 [IU] | Freq: Once | INTRAVENOUS | Status: AC | PRN
  Administered 2014-11-27: 500 [IU]
  Filled 2014-11-27: qty 5

## 2014-11-27 MED ORDER — PALONOSETRON HCL INJECTION 0.25 MG/5ML
INTRAVENOUS | Status: AC
  Filled 2014-11-27: qty 5

## 2014-11-27 MED ORDER — SODIUM CHLORIDE 0.9 % IV SOLN
Freq: Once | INTRAVENOUS | Status: AC
  Administered 2014-11-27: 10:00:00 via INTRAVENOUS

## 2014-11-27 MED ORDER — SODIUM CHLORIDE 0.9 % IJ SOLN
10.0000 mL | INTRAMUSCULAR | Status: DC | PRN
  Administered 2014-11-27: 10 mL
  Filled 2014-11-27: qty 10

## 2014-11-27 NOTE — Patient Instructions (Signed)
[facility] Discharge Instructions for Patients Receiving Chemotherapy  Today you received the following chemotherapy agents:  Abraxane  To help prevent nausea and vomiting after your treatment, we encourage you to take your nausea medication as ordered per MD.   If you develop nausea and vomiting that is not controlled by your nausea medication, call the clinic.   BELOW ARE SYMPTOMS THAT SHOULD BE REPORTED IMMEDIATELY:  *FEVER GREATER THAN 100.5 F  *CHILLS WITH OR WITHOUT FEVER  NAUSEA AND VOMITING THAT IS NOT CONTROLLED WITH YOUR NAUSEA MEDICATION  *UNUSUAL SHORTNESS OF BREATH  *UNUSUAL BRUISING OR BLEEDING  TENDERNESS IN MOUTH AND THROAT WITH OR WITHOUT PRESENCE OF ULCERS  *URINARY PROBLEMS  *BOWEL PROBLEMS  UNUSUAL RASH Items with * indicate a potential emergency and should be followed up as soon as possible.  Feel free to call the clinic you have any questions or concerns. The clinic phone number is [phone].  Please show the [facility] at check-in to the Emergency Department and triage nurse.  Nanoparticle Albumin-Bound Paclitaxel injection What is this medicine? NANOPARTICLE ALBUMIN-BOUND PACLITAXEL (Na no PAHR ti kuhl al BYOO muhn-bound PAK li TAX el) is a chemotherapy drug. It targets fast dividing cells, like cancer cells, and causes these cells to die. This medicine is used to treat advanced breast cancer and advanced lung cancer. This medicine may be used for other purposes; ask your health care provider or pharmacist if you have questions. COMMON BRAND NAME(S): Abraxane What should I tell my health care provider before I take this medicine? They need to know if you have any of these conditions: -kidney disease -liver disease -low blood counts, like low platelets, red blood cells, or white blood cells -recent or ongoing radiation therapy -an unusual or allergic reaction to paclitaxel, albumin, other chemotherapy, other medicines,  foods, dyes, or preservatives -pregnant or trying to get pregnant -breast-feeding How should I use this medicine? This drug is given as an infusion into a vein. It is administered in a hospital or clinic by a specially trained health care professional. Talk to your pediatrician regarding the use of this medicine in children. Special care may be needed. Overdosage: If you think you have taken too much of this medicine contact a poison control center or emergency room at once. NOTE: This medicine is only for you. Do not share this medicine with others. What if I miss a dose? It is important not to miss your dose. Call your doctor or health care professional if you are unable to keep an appointment. What may interact with this medicine? -cyclosporine -diazepam -ketoconazole -medicines to increase blood counts like filgrastim, pegfilgrastim, sargramostim -other chemotherapy drugs like cisplatin, doxorubicin, epirubicin, etoposide, teniposide, vincristine -quinidine -testosterone -vaccines -verapamil Talk to your doctor or health care professional before taking any of these medicines: -acetaminophen -aspirin -ibuprofen -ketoprofen -naproxen This list may not describe all possible interactions. Give your health care provider a list of all the medicines, herbs, non-prescription drugs, or dietary supplements you use. Also tell them if you smoke, drink alcohol, or use illegal drugs. Some items may interact with your medicine. What should I watch for while using this medicine? Your condition will be monitored carefully while you are receiving this medicine. You will need important blood work done while you are taking this medicine. This drug may make you feel generally unwell. This is not uncommon, as chemotherapy can affect healthy cells as well as cancer cells. Report any side effects. Continue your course  of treatment even though you feel ill unless your doctor tells you to stop. In some  cases, you may be given additional medicines to help with side effects. Follow all directions for their use. Call your doctor or health care professional for advice if you get a fever, chills or sore throat, or other symptoms of a cold or flu. Do not treat yourself. This drug decreases your body's ability to fight infections. Try to avoid being around people who are sick. This medicine may increase your risk to bruise or bleed. Call your doctor or health care professional if you notice any unusual bleeding. Be careful brushing and flossing your teeth or using a toothpick because you may get an infection or bleed more easily. If you have any dental work done, tell your dentist you are receiving this medicine. Avoid taking products that contain aspirin, acetaminophen, ibuprofen, naproxen, or ketoprofen unless instructed by your doctor. These medicines may hide a fever. Do not become pregnant while taking this medicine. Women should inform their doctor if they wish to become pregnant or think they might be pregnant. There is a potential for serious side effects to an unborn child. Talk to your health care professional or pharmacist for more information. Do not breast-feed an infant while taking this medicine. Men are advised not to father a child while receiving this medicine. What side effects may I notice from receiving this medicine? Side effects that you should report to your doctor or health care professional as soon as possible: -allergic reactions like skin rash, itching or hives, swelling of the face, lips, or tongue -low blood counts - This drug may decrease the number of white blood cells, red blood cells and platelets. You may be at increased risk for infections and bleeding. -signs of infection - fever or chills, cough, sore throat, pain or difficulty passing urine -signs of decreased platelets or bleeding - bruising, pinpoint red spots on the skin, black, tarry stools, nosebleeds -signs of  decreased red blood cells - unusually weak or tired, fainting spells, lightheadedness -breathing problems -changes in vision -chest pain -high or low blood pressure -mouth sores -nausea and vomiting -pain, swelling, redness or irritation at the injection site -pain, tingling, numbness in the hands or feet -slow or irregular heartbeat -swelling of the ankle, feet, hands Side effects that usually do not require medical attention (report to your doctor or health care professional if they continue or are bothersome): -aches, pains -changes in the color of fingernails -diarrhea -hair loss -loss of appetite This list may not describe all possible side effects. Call your doctor for medical advice about side effects. You may report side effects to FDA at 1-800-FDA-1088. Where should I keep my medicine? This drug is given in a hospital or clinic and will not be stored at home. NOTE: This sheet is a summary. It may not cover all possible information. If you have questions about this medicine, talk to your doctor, pharmacist, or health care provider.  2015, Elsevier/Gold Standard. (2012-04-19 16:48:50)

## 2014-11-27 NOTE — Addendum Note (Signed)
Addended by: Prentiss Bells on: 11/27/2014 08:44 AM   Modules accepted: Medications

## 2014-11-27 NOTE — Telephone Encounter (Signed)
Appointments added per chemo plan.

## 2014-11-27 NOTE — Progress Notes (Signed)
Patient Care Team: Chipper Herb, MD as PCP - General (Family Medicine)  DIAGNOSIS: No matching staging information was found for the patient.  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, with LCIS, medial and inferior margins positive, 3/4 lymph nodes positive T2 N1 cM0 stage IIB   08/16/2014 Surgery Left breast medial margin reexcision residual ILC 0.2 cm; inferior margin residual foci less than 0.2 cm, 1/5 lymph nodes positive, 1 lymph node with isolated tumor cells (Overall 4/10)   09/04/2014 -  Chemotherapy Adjuvant chemotherapy with dose dense Adriamycin and Cytoxan 4 followed by Abraxane weekly 12    CHIEF COMPLIANT: cycle 3 of Abraxane  INTERVAL HISTORY: [name] is a 54 year old with above-mentioned history of left breast cancer currently on adjuvant chemotherapy and is today is here to receive week 3 of Abraxane. She had much better time with the last cycle because her fatigue had improved with caffeine. She was not a previous coffee or Coca-Cola or Pepsi drinker. But with a small amount of caffeine she was able to function significantly better.denies any neuropathy. Denies any nausea vomiting. She has noticed that her hair is coming back slowly.  REVIEW OF SYSTEMS:   Constitutional: Denies fevers, chills or abnormal weight loss Eyes: Denies blurriness of vision Ears, nose, mouth, throat, and face: Denies mucositis or sore throat Respiratory: Denies cough,  dyspnea or wheezes Cardiovascular: Denies palpitation, chest discomfort or lower extremity swelling Gastrointestinal:  Denies nausea, heartburn or change in bowel habits Skin: Denies abnormal skin rashes Lymphatics: Denies new lymphadenopathy or easy bruising Neurological:Denies numbness, tingling or new weaknesses Behavioral/Psych: Mood is stable, no new changes   All other systems were reviewed with the patient and are negative.  I have reviewed the past medical history, past surgical history, social history and family history with the patient and they are unchanged from previous note.  ALLERGIES:  is allergic to crestor; lipitor; and temazepam.  MEDICATIONS:  Current Outpatient Prescriptions  Medication Sig Dispense Refill  . acetaminophen (TYLENOL) 325 MG tablet Take 650 mg by mouth every 6 (six) hours as needed for moderate pain.    . clonazePAM (KLONOPIN) 0.5 MG tablet TAKE 1 TABLET THREE TIMES A DAY 90 tablet 2  . dexamethasone (DECADRON) 4 MG tablet TAKE 1 TAB ONCE A DAY ON THE DAY AFTER CHEMO. THEN TAKE 1 TAB TWICE DAILY FOR 2DAYS. TAKE WITH FOOD  1  . hydroxypropyl methylcellulose / hypromellose (ISOPTO TEARS / GONIOVISC) 2.5 % ophthalmic solution Place 1 drop into both eyes 3 (three) times daily as needed for dry eyes.    Marland Kitchen lidocaine-prilocaine (EMLA) cream APPLY TO AFFECTED AREA ONCE  3  . LORazepam (ATIVAN) 0.5 MG tablet Take 1 tablet (0.5 mg total) by mouth at bedtime. 30 tablet 0  . ondansetron (ZOFRAN) 8 MG tablet Take 1 tablet (8 mg total) by mouth 2 (two) times daily. Start the day after chemo for 2 days. Then take as needed  for nausea or vomiting. 30 tablet 1  . PARoxetine (PAXIL-CR) 25 MG 24 hr tablet TAKE 1 TABLET BY MOUTH EVERY DAY 90 tablet 2  . prochlorperazine (COMPAZINE) 10 MG tablet Take 1 tablet (10 mg total) by mouth every 6 (six) hours as needed (Nausea or vomiting). 30 tablet 1   No current facility-administered medications for this visit.    PHYSICAL  EXAMINATION: ECOG PERFORMANCE STATUS: 1 - Symptomatic but completely ambulatory  Filed Vitals:   11/27/14 0815  BP: 130/72  Pulse: 94  Temp: 98.5 F (36.9 C)  Resp: 18   Filed Weights   11/27/14 0815  Weight: 142 lb 6.4 oz (64.592 kg)    GENERAL:alert, no distress and comfortable, alopecia SKIN: skin color, texture, turgor are normal, no rashes or significant lesions EYES: normal, Conjunctiva are pink and non-injected, sclera clear OROPHARYNX:no exudate, no erythema and lips, buccal mucosa, and tongue normal  NECK: supple, thyroid normal size, non-tender, without nodularity LYMPH:  no palpable lymphadenopathy in the cervical, axillary or inguinal LUNGS: clear to auscultation and percussion with normal breathing effort HEART: regular rate & rhythm and no murmurs and no lower extremity edema ABDOMEN:abdomen soft, non-tender and normal bowel sounds Musculoskeletal:no cyanosis of digits and no clubbing  NEURO: alert & oriented x 3 with fluent speech, no focal motor/sensory deficits  LABORATORY DATA:  I have reviewed the data as listed   Chemistry      Component Value Date/Time   NA 142 11/20/2014 0759   NA 132* 09/09/2014 2336   NA 141 07/21/2013 1023   K 3.3* 11/20/2014 0759   K 3.6 09/09/2014 2336   CL 99* 09/09/2014 2336   CO2 25 11/20/2014 0759   CO2 25 09/09/2014 2336   BUN 6.9* 11/20/2014 0759   BUN 15 09/09/2014 2336   BUN 14 07/21/2013 1023   CREATININE 0.5* 11/20/2014 0759   CREATININE 0.43* 09/09/2014 2336   CREATININE 0.54 10/04/2012 1123      Component Value Date/Time   CALCIUM 8.2* 11/20/2014 0759   CALCIUM 8.2* 09/09/2014 2336   ALKPHOS 77 11/20/2014 0759   ALKPHOS 70 09/09/2014 2336   AST 43* 11/20/2014 0759   AST 19 09/09/2014 2336   ALT 26 11/20/2014 0759   ALT 26 09/09/2014 2336   BILITOT 0.70 11/20/2014 0759   BILITOT 0.8 09/09/2014 2336       Lab Results  Component Value Date   WBC 4.5 11/27/2014   HGB 9.3* 11/27/2014   HCT 28.6*  11/27/2014   MCV 99.0 11/27/2014   PLT 187 11/27/2014   NEUTROABS 1.4* 11/27/2014     RADIOGRAPHIC STUDIES: I have personally reviewed the radiology reports and agreed with their findings. No results found.   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 Adjuvant chemotherapy with dose dense Adriamycin and Cytoxan x 4 followed by Abraxane weekly 12 because of lymph node positive disease. 2. Followed by adjuvant radiation 3. Followed by antiestrogen therapy  Current Treatment: completed 4 cycles of dose dense Adriamycin and Cytoxan (Prev held chemotherapy for thrombocytopenia,  dose reduction for neutropenia), today is cycle 3 Abraxane weekly 12  Chemotoxicities: 1. Hospitalization for neutropenic fever after cycle 1: Blood cultures were all been negative as well as chest x-ray. She was given antibiotics and discharged home. 2. Fatigue: Reduced the dosage of chemotherapy for cycle 2 and further with cycle 3. Will reduce again with 4th cycle. 3. Thrombocytopenia: Platelets 61 10/02/2014: Held chemotherapy, dose reduction with cycle 3. Platelets 53 10/24/2014: held chemotherapy, dose reduction with cycle 4. Monitoring platelets closely 4. Fevers 09/18/2014: W/U negative for source of infection, patient has persistent fevers as high as 102.8 and they get better with Tylenol. Fevers have been persistent throughout chemotherapy. Not infectious in etiology. 5. Anemia due to chemotherapy: symptomatic anemia with hgb 8.2.  Improvement to 11.3 after 2 units of blood on 10/23/14, hemoglobin 9.3 on 11/27/2014 6. Neutropenia: managed with neupogen injections after chemo treatments with cycle 3. But she had intractable back spasms and does not want to use Neupogen anymore. 7. Headaches: continue tylenol. Use massage or icy hot to shoulders and neck area  Monitoring very closely for toxicities. Patient understands that we might have to do dose reductions if her neutrophil count does not stay within the range for treatment. Return to clinic in 1 week for cycle 4/12 Abraxane and for toxicity check   No orders of the defined types were placed in this encounter.   The patient has a good understanding of the overall plan. she agrees with it. she will call with any problems that may develop before the next visit here.   Rulon Eisenmenger, MD

## 2014-11-27 NOTE — Progress Notes (Signed)
OK to treat with ANC 1.4 per Dr. Lindi Adie.

## 2014-11-27 NOTE — Assessment & Plan Note (Addendum)
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 Adjuvant chemotherapy with dose dense Adriamycin and Cytoxan x 4 followed by Abraxane weekly 12 because of lymph node positive disease. 2. Followed by adjuvant radiation 3. Followed by antiestrogen therapy  Current Treatment: completed 4 cycles of dose dense Adriamycin and Cytoxan (Prev held chemotherapy for thrombocytopenia, dose reduction for neutropenia), today is cycle 3 Abraxane weekly 12  Chemotoxicities: 1. Hospitalization for neutropenic fever after cycle 1: Blood cultures were all been negative as well as chest x-ray. She was given antibiotics and discharged home. 2. Fatigue: Reduced the dosage of chemotherapy for cycle 2 and further with cycle 3. Will reduce again with 4th cycle. 3. Thrombocytopenia: Platelets 61 10/02/2014: Held chemotherapy, dose reduction with cycle 3. Platelets 53 10/24/2014: held chemotherapy, dose reduction with cycle 4. In spite of that platelet count has remained low at 45 11/06/2058 4. Fevers 09/18/2014: W/U negative for source of infection, patient has persistent fevers as high as 102.8 and they get better with Tylenol. Fevers have been persistent throughout chemotherapy. Not infectious in etiology. 5. Anemia due to chemotherapy: symptomatic anemia with hgb 8.2. Improvement to 11.3 after 2 units of blood on 10/23/14, hemoglobin 9 on 11/14/2014 6. Neutropenia: managed with neupogen injections after chemo treatments with cycle 3. But she had intractable back spasms and does not want to use Neupogen anymore. 7. Headaches:  continue tylenol. Use massage or icy hot to shoulders and neck area  Monitoring very closely for toxicities. Patient understands that we might have to do dose reductions if her neutrophil count does not stay within the range for treatment. Return to clinic in 1 week for cycle 4/12 Abraxane and for toxicity check

## 2014-11-28 ENCOUNTER — Ambulatory Visit: Payer: PRIVATE HEALTH INSURANCE | Admitting: Hematology and Oncology

## 2014-11-28 ENCOUNTER — Other Ambulatory Visit: Payer: PRIVATE HEALTH INSURANCE

## 2014-12-03 NOTE — Assessment & Plan Note (Signed)
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 Adjuvant chemotherapy with dose dense Adriamycin and Cytoxan x 4 followed by Abraxane weekly 12 because of lymph node positive disease. 2. Followed by adjuvant radiation 3. Followed by antiestrogen therapy  Current Treatment: completed 4 cycles of dose dense Adriamycin and Cytoxan (Prev held chemotherapy for thrombocytopenia, dose reduction for neutropenia), today is cycle 4 Abraxane weekly 12  Chemotoxicities: 1. Hospitalization for neutropenic fever after cycle 1: Blood cultures were all been negative as well as chest x-ray. She was given antibiotics and discharged home. 2. Fatigue: Reduced the dosage of chemotherapy for cycle 2 and further with cycle 3. Will reduce again with 4th cycle. 3. Thrombocytopenia: Platelets 61 10/02/2014: Held chemotherapy, dose reduction with cycle 3. Platelets 53 10/24/2014: held chemotherapy, dose reduction with cycle 4. Monitoring platelets closely 4. Fevers 09/18/2014: W/U negative for source of infection, patient has persistent fevers as high as 102.8 and they get better with Tylenol. Fevers have been persistent throughout chemotherapy. Not infectious in etiology. 5. Anemia due to chemotherapy: symptomatic anemia with hgb 8.2. Improvement to 11.3 after 2 units of blood on 10/23/14, hemoglobin 9.3 on 11/27/2014 6. Neutropenia: managed with neupogen injections after chemo treatments with cycle 3. But she had intractable back spasms and does not want to use Neupogen anymore. 7. Headaches: continue tylenol. Use massage or icy hot  to shoulders and neck area  Monitoring very closely for toxicities. Patient understands that we might have to do dose reductions if her neutrophil count does not stay within the range for treatment. Return to clinic in 1 week for cycle 5/12 Abraxane and for toxicity check

## 2014-12-04 ENCOUNTER — Other Ambulatory Visit (HOSPITAL_BASED_OUTPATIENT_CLINIC_OR_DEPARTMENT_OTHER): Payer: PRIVATE HEALTH INSURANCE

## 2014-12-04 ENCOUNTER — Ambulatory Visit (HOSPITAL_BASED_OUTPATIENT_CLINIC_OR_DEPARTMENT_OTHER): Payer: PRIVATE HEALTH INSURANCE

## 2014-12-04 ENCOUNTER — Telehealth: Payer: Self-pay | Admitting: Hematology and Oncology

## 2014-12-04 ENCOUNTER — Encounter: Payer: Self-pay | Admitting: Hematology and Oncology

## 2014-12-04 ENCOUNTER — Ambulatory Visit (HOSPITAL_BASED_OUTPATIENT_CLINIC_OR_DEPARTMENT_OTHER): Payer: PRIVATE HEALTH INSURANCE | Admitting: Hematology and Oncology

## 2014-12-04 VITALS — BP 127/78 | HR 100 | Temp 98.7°F | Resp 18 | Ht 65.0 in | Wt 140.0 lb

## 2014-12-04 DIAGNOSIS — C50412 Malignant neoplasm of upper-outer quadrant of left female breast: Secondary | ICD-10-CM

## 2014-12-04 DIAGNOSIS — Z5111 Encounter for antineoplastic chemotherapy: Secondary | ICD-10-CM

## 2014-12-04 DIAGNOSIS — D6481 Anemia due to antineoplastic chemotherapy: Secondary | ICD-10-CM | POA: Diagnosis not present

## 2014-12-04 DIAGNOSIS — T451X5A Adverse effect of antineoplastic and immunosuppressive drugs, initial encounter: Secondary | ICD-10-CM

## 2014-12-04 LAB — COMPREHENSIVE METABOLIC PANEL (CC13)
ALT: 16 U/L (ref 0–55)
ANION GAP: 8 meq/L (ref 3–11)
AST: 27 U/L (ref 5–34)
Albumin: 3.4 g/dL — ABNORMAL LOW (ref 3.5–5.0)
Alkaline Phosphatase: 86 U/L (ref 40–150)
BILIRUBIN TOTAL: 0.55 mg/dL (ref 0.20–1.20)
BUN: 6.4 mg/dL — ABNORMAL LOW (ref 7.0–26.0)
CHLORIDE: 107 meq/L (ref 98–109)
CO2: 26 meq/L (ref 22–29)
Calcium: 8.8 mg/dL (ref 8.4–10.4)
Creatinine: 0.6 mg/dL (ref 0.6–1.1)
Glucose: 104 mg/dl (ref 70–140)
Potassium: 3.8 mEq/L (ref 3.5–5.1)
Sodium: 140 mEq/L (ref 136–145)
Total Protein: 6.1 g/dL — ABNORMAL LOW (ref 6.4–8.3)

## 2014-12-04 LAB — CBC WITH DIFFERENTIAL/PLATELET
BASO%: 0.7 % (ref 0.0–2.0)
Basophils Absolute: 0 10*3/uL (ref 0.0–0.1)
EOS ABS: 0.1 10*3/uL (ref 0.0–0.5)
EOS%: 2 % (ref 0.0–7.0)
HCT: 31 % — ABNORMAL LOW (ref 34.8–46.6)
HGB: 10.2 g/dL — ABNORMAL LOW (ref 11.6–15.9)
LYMPH%: 19.3 % (ref 14.0–49.7)
MCH: 32.4 pg (ref 25.1–34.0)
MCHC: 32.7 g/dL (ref 31.5–36.0)
MCV: 99 fL (ref 79.5–101.0)
MONO#: 0.7 10*3/uL (ref 0.1–0.9)
MONO%: 16.2 % — ABNORMAL HIGH (ref 0.0–14.0)
NEUT#: 2.8 10*3/uL (ref 1.5–6.5)
NEUT%: 61.8 % (ref 38.4–76.8)
PLATELETS: 213 10*3/uL (ref 145–400)
RBC: 3.13 10*6/uL — AB (ref 3.70–5.45)
RDW: 23.7 % — ABNORMAL HIGH (ref 11.2–14.5)
WBC: 4.5 10*3/uL (ref 3.9–10.3)
lymph#: 0.9 10*3/uL (ref 0.9–3.3)

## 2014-12-04 MED ORDER — SODIUM CHLORIDE 0.9 % IJ SOLN
10.0000 mL | INTRAMUSCULAR | Status: DC | PRN
  Administered 2014-12-04: 10 mL
  Filled 2014-12-04: qty 10

## 2014-12-04 MED ORDER — PACLITAXEL PROTEIN-BOUND CHEMO INJECTION 100 MG
65.0000 mg/m2 | Freq: Once | INTRAVENOUS | Status: AC
  Administered 2014-12-04: 100 mg via INTRAVENOUS
  Filled 2014-12-04: qty 20

## 2014-12-04 MED ORDER — PALONOSETRON HCL INJECTION 0.25 MG/5ML
INTRAVENOUS | Status: AC
  Filled 2014-12-04: qty 5

## 2014-12-04 MED ORDER — SODIUM CHLORIDE 0.9 % IV SOLN
Freq: Once | INTRAVENOUS | Status: AC
  Administered 2014-12-04: 09:00:00 via INTRAVENOUS

## 2014-12-04 MED ORDER — HEPARIN SOD (PORK) LOCK FLUSH 100 UNIT/ML IV SOLN
500.0000 [IU] | Freq: Once | INTRAVENOUS | Status: AC | PRN
Start: 2014-12-04 — End: 2014-12-04
  Administered 2014-12-04: 500 [IU]
  Filled 2014-12-04: qty 5

## 2014-12-04 MED ORDER — PALONOSETRON HCL INJECTION 0.25 MG/5ML
0.2500 mg | Freq: Once | INTRAVENOUS | Status: AC
  Administered 2014-12-04: 0.25 mg via INTRAVENOUS

## 2014-12-04 NOTE — Telephone Encounter (Signed)
Gave avs & calendar for September/October. °

## 2014-12-04 NOTE — Progress Notes (Signed)
Patient Care Team: Chipper Herb, MD as PCP - General (Family Medicine)  DIAGNOSIS: No matching staging information was found for the patient.  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 Today is cycle 4 of Abraxane. Her major complaint is fatigue. She also has difficulty catching a deep breath. After each chemotherapy she cannot function for 1-2 days. She is mostly sleeping and resting. Her taste is still very good. Denies any nausea vomiting. Denies any diarrhea or constipation.  REVIEW OF SYSTEMS:   Constitutional: Denies fevers, chills or abnormal weight loss Eyes: Denies blurriness of vision Ears, nose, mouth, throat, and face: Denies mucositis or sore throat Respiratory: Denies cough, dyspnea or wheezes Cardiovascular: Denies palpitation, chest discomfort or lower extremity  swelling Gastrointestinal:  Denies nausea, heartburn or change in bowel habits Skin: Denies abnormal skin rashes Lymphatics: Denies new lymphadenopathy or easy bruising Neurological:Denies numbness, tingling or new weaknesses Behavioral/Psych: Mood is stable, no new changes  Breast:  denies any pain or lumps or nodules in either breasts All other systems were reviewed with the patient and are negative.  I have reviewed the past medical history, past surgical history, social history and family history with the patient and they are unchanged from previous note.  ALLERGIES:  is allergic to crestor; lipitor; and temazepam.  MEDICATIONS:  Current Outpatient Prescriptions  Medication Sig Dispense Refill  . acetaminophen (TYLENOL) 325 MG tablet Take 650 mg by mouth every 6 (six) hours as needed for moderate pain.    . clonazePAM (KLONOPIN) 0.5 MG tablet TAKE 1 TABLET THREE TIMES A DAY 90 tablet 2  . dexamethasone (DECADRON) 4 MG tablet TAKE 1 TAB ONCE A DAY ON THE DAY AFTER CHEMO. THEN TAKE 1 TAB TWICE DAILY FOR 2DAYS. TAKE WITH FOOD  1  . hydroxypropyl methylcellulose / hypromellose (ISOPTO TEARS / GONIOVISC) 2.5 % ophthalmic solution Place 1 drop into both eyes 3 (three) times daily as needed for dry eyes.    Marland Kitchen lidocaine-prilocaine (EMLA) cream APPLY TO AFFECTED AREA ONCE  3  . LORazepam (ATIVAN) 0.5 MG tablet Take 1 tablet (0.5 mg total) by mouth at bedtime. 30 tablet 0  . ondansetron (ZOFRAN) 8 MG tablet Take 1 tablet (8 mg total) by mouth 2 (two) times daily. Start the day after chemo for 2 days. Then take as needed for nausea or vomiting. 30 tablet 1  .  PARoxetine (PAXIL-CR) 25 MG 24 hr tablet TAKE 1 TABLET BY MOUTH EVERY DAY 90 tablet 2  . prochlorperazine (COMPAZINE) 10 MG tablet Take 1 tablet (10 mg total) by mouth every 6 (six) hours as needed (Nausea or vomiting). 30 tablet 1   No current facility-administered medications for this visit.    PHYSICAL EXAMINATION: ECOG PERFORMANCE  STATUS: 1 - Symptomatic but completely ambulatory  Filed Vitals:   12/04/14 0818  BP: 127/78  Pulse: 100  Temp: 98.7 F (37.1 C)  Resp: 18   Filed Weights   12/04/14 0818  Weight: 140 lb (63.504 kg)    GENERAL:alert, no distress and comfortable SKIN: skin color, texture, turgor are normal, no rashes or significant lesions EYES: normal, Conjunctiva are pink and non-injected, sclera clear OROPHARYNX:no exudate, no erythema and lips, buccal mucosa, and tongue normal  NECK: supple, thyroid normal size, non-tender, without nodularity LYMPH:  no palpable lymphadenopathy in the cervical, axillary or inguinal LUNGS: clear to auscultation and percussion with normal breathing effort HEART: regular rate & rhythm and no murmurs and no lower extremity edema ABDOMEN:abdomen soft, non-tender and normal bowel sounds Musculoskeletal:no cyanosis of digits and no clubbing  NEURO: alert & oriented x 3 with fluent speech, no focal motor/sensory deficits  LABORATORY DATA:  I have reviewed the data as listed   Chemistry      Component Value Date/Time   NA 140 12/04/2014 0803   NA 132* 09/09/2014 2336   NA 141 07/21/2013 1023   K 3.8 12/04/2014 0803   K 3.6 09/09/2014 2336   CL 99* 09/09/2014 2336   CO2 26 12/04/2014 0803   CO2 25 09/09/2014 2336   BUN 6.4* 12/04/2014 0803   BUN 15 09/09/2014 2336   BUN 14 07/21/2013 1023   CREATININE 0.6 12/04/2014 0803   CREATININE 0.43* 09/09/2014 2336   CREATININE 0.54 10/04/2012 1123      Component Value Date/Time   CALCIUM 8.8 12/04/2014 0803   CALCIUM 8.2* 09/09/2014 2336   ALKPHOS 86 12/04/2014 0803   ALKPHOS 70 09/09/2014 2336   AST 27 12/04/2014 0803   AST 19 09/09/2014 2336   ALT 16 12/04/2014 0803   ALT 26 09/09/2014 2336   BILITOT 0.55 12/04/2014 0803   BILITOT 0.8 09/09/2014 2336       Lab Results  Component Value Date   WBC 4.5 12/04/2014   HGB 10.2* 12/04/2014   HCT 31.0* 12/04/2014   MCV 99.0 12/04/2014   PLT 213  12/04/2014   NEUTROABS 2.8 12/04/2014   ASSESSMENT & PLAN:  Breast cancer of upper-outer quadrant of left female breast Left breast lumpectomy 08/08/2014: 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 Adjuvant chemotherapy with dose dense Adriamycin and Cytoxan x 4 followed by Abraxane weekly 12 because of lymph node positive disease. 2. Followed by adjuvant radiation 3. Followed by antiestrogen therapy  Current Treatment: completed 4 cycles of dose dense Adriamycin and Cytoxan (Prev held chemotherapy for thrombocytopenia, dose reduction for neutropenia), today is cycle 4 Abraxane weekly 12  Chemotoxicities: 1. Hospitalization for neutropenic fever after cycle 1: Blood cultures were all been negative as well as chest x-ray. She  was given antibiotics and discharged home. 2. Fatigue: Reduced the dosage of chemotherapy for cycle 2 and further with cycle 3. Will reduce again with 4th cycle. 3. Thrombocytopenia: Platelets 61 10/02/2014: Held chemotherapy, dose reduction with cycle 3. Platelets 53 10/24/2014: held chemotherapy, dose reduction with cycle 4. Monitoring platelets closely 4. Fevers 09/18/2014: W/U negative for source of infection, patient has persistent fevers as high as 102.8 and they get better with Tylenol. Fevers have been persistent throughout chemotherapy. Not infectious in etiology. 5. Anemia due to chemotherapy: symptomatic anemia with hgb 8.2. Improvement to 11.3 after 2 units of blood on 10/23/14, hemoglobin 9.3 on 11/27/2014 6. Neutropenia: managed with neupogen injections after chemo treatments with cycle 3. But she  had intractable back spasms and does not want to use Neupogen anymore. 7. Headaches: continue tylenol. Use massage or icy hot to shoulders and neck area  Monitoring very closely for toxicities. Patient understands that we might have to do dose reductions if her neutrophil count does not stay within the range for treatment. Return to clinic in 1 week for cycle 5/12 Abraxane and for toxicity check   No orders of the defined types were placed in this encounter.   The patient has a good understanding of the overall plan. she agrees with it. she will call with any problems that may develop before the next visit here.   Rulon Eisenmenger, MD

## 2014-12-05 ENCOUNTER — Other Ambulatory Visit: Payer: PRIVATE HEALTH INSURANCE

## 2014-12-05 ENCOUNTER — Ambulatory Visit: Payer: PRIVATE HEALTH INSURANCE | Admitting: Hematology and Oncology

## 2014-12-09 ENCOUNTER — Other Ambulatory Visit: Payer: Self-pay | Admitting: Family Medicine

## 2014-12-11 ENCOUNTER — Other Ambulatory Visit (HOSPITAL_BASED_OUTPATIENT_CLINIC_OR_DEPARTMENT_OTHER): Payer: PRIVATE HEALTH INSURANCE

## 2014-12-11 ENCOUNTER — Encounter: Payer: Self-pay | Admitting: Hematology and Oncology

## 2014-12-11 ENCOUNTER — Ambulatory Visit (HOSPITAL_BASED_OUTPATIENT_CLINIC_OR_DEPARTMENT_OTHER): Payer: PRIVATE HEALTH INSURANCE | Admitting: Hematology and Oncology

## 2014-12-11 ENCOUNTER — Telehealth: Payer: Self-pay | Admitting: Hematology and Oncology

## 2014-12-11 ENCOUNTER — Ambulatory Visit (HOSPITAL_BASED_OUTPATIENT_CLINIC_OR_DEPARTMENT_OTHER): Payer: PRIVATE HEALTH INSURANCE

## 2014-12-11 VITALS — BP 129/62 | HR 100 | Temp 98.8°F | Resp 18 | Ht 65.0 in | Wt 139.9 lb

## 2014-12-11 DIAGNOSIS — C50412 Malignant neoplasm of upper-outer quadrant of left female breast: Secondary | ICD-10-CM

## 2014-12-11 DIAGNOSIS — T451X5A Adverse effect of antineoplastic and immunosuppressive drugs, initial encounter: Secondary | ICD-10-CM

## 2014-12-11 DIAGNOSIS — D6481 Anemia due to antineoplastic chemotherapy: Secondary | ICD-10-CM

## 2014-12-11 DIAGNOSIS — Z5111 Encounter for antineoplastic chemotherapy: Secondary | ICD-10-CM

## 2014-12-11 LAB — COMPREHENSIVE METABOLIC PANEL (CC13)
ALBUMIN: 3.3 g/dL — AB (ref 3.5–5.0)
ALK PHOS: 80 U/L (ref 40–150)
ALT: 22 U/L (ref 0–55)
AST: 31 U/L (ref 5–34)
Anion Gap: 7 mEq/L (ref 3–11)
BILIRUBIN TOTAL: 0.41 mg/dL (ref 0.20–1.20)
BUN: 4.9 mg/dL — AB (ref 7.0–26.0)
CO2: 24 meq/L (ref 22–29)
CREATININE: 0.6 mg/dL (ref 0.6–1.1)
Calcium: 8.8 mg/dL (ref 8.4–10.4)
Chloride: 108 mEq/L (ref 98–109)
GLUCOSE: 97 mg/dL (ref 70–140)
Potassium: 3.6 mEq/L (ref 3.5–5.1)
SODIUM: 139 meq/L (ref 136–145)
TOTAL PROTEIN: 6.2 g/dL — AB (ref 6.4–8.3)

## 2014-12-11 LAB — CBC WITH DIFFERENTIAL/PLATELET
BASO%: 0.4 % (ref 0.0–2.0)
Basophils Absolute: 0 10*3/uL (ref 0.0–0.1)
EOS%: 3.7 % (ref 0.0–7.0)
Eosinophils Absolute: 0.2 10*3/uL (ref 0.0–0.5)
HCT: 30.9 % — ABNORMAL LOW (ref 34.8–46.6)
HEMOGLOBIN: 10.1 g/dL — AB (ref 11.6–15.9)
LYMPH%: 24.6 % (ref 14.0–49.7)
MCH: 32.8 pg (ref 25.1–34.0)
MCHC: 32.7 g/dL (ref 31.5–36.0)
MCV: 100.3 fL (ref 79.5–101.0)
MONO#: 0.6 10*3/uL (ref 0.1–0.9)
MONO%: 13.8 % (ref 0.0–14.0)
NEUT%: 57.5 % (ref 38.4–76.8)
NEUTROS ABS: 2.6 10*3/uL (ref 1.5–6.5)
Platelets: 171 10*3/uL (ref 145–400)
RBC: 3.08 10*6/uL — AB (ref 3.70–5.45)
RDW: 19.9 % — AB (ref 11.2–14.5)
WBC: 4.6 10*3/uL (ref 3.9–10.3)
lymph#: 1.1 10*3/uL (ref 0.9–3.3)

## 2014-12-11 MED ORDER — SODIUM CHLORIDE 0.9 % IV SOLN
Freq: Once | INTRAVENOUS | Status: AC
  Administered 2014-12-11: 09:00:00 via INTRAVENOUS

## 2014-12-11 MED ORDER — HEPARIN SOD (PORK) LOCK FLUSH 100 UNIT/ML IV SOLN
500.0000 [IU] | Freq: Once | INTRAVENOUS | Status: AC | PRN
  Administered 2014-12-11: 500 [IU]
  Filled 2014-12-11: qty 5

## 2014-12-11 MED ORDER — PALONOSETRON HCL INJECTION 0.25 MG/5ML
0.2500 mg | Freq: Once | INTRAVENOUS | Status: AC
  Administered 2014-12-11: 0.25 mg via INTRAVENOUS

## 2014-12-11 MED ORDER — PACLITAXEL PROTEIN-BOUND CHEMO INJECTION 100 MG
65.0000 mg/m2 | Freq: Once | INTRAVENOUS | Status: AC
Start: 2014-12-11 — End: 2014-12-11
  Administered 2014-12-11: 100 mg via INTRAVENOUS
  Filled 2014-12-11: qty 20

## 2014-12-11 MED ORDER — PALONOSETRON HCL INJECTION 0.25 MG/5ML
INTRAVENOUS | Status: AC
  Filled 2014-12-11: qty 5

## 2014-12-11 MED ORDER — SODIUM CHLORIDE 0.9 % IJ SOLN
10.0000 mL | INTRAMUSCULAR | Status: DC | PRN
  Administered 2014-12-11: 10 mL
  Filled 2014-12-11: qty 10

## 2014-12-11 NOTE — Patient Instructions (Signed)
[facility] Discharge Instructions for Patients Receiving Chemotherapy  Today you received the following chemotherapy agents Abraxane To help prevent nausea and vomiting after your treatment, we encourage you to take your nausea medication as prescribed.   If you develop nausea and vomiting that is not controlled by your nausea medication, call the clinic.   BELOW ARE SYMPTOMS THAT SHOULD BE REPORTED IMMEDIATELY:  *FEVER GREATER THAN 100.5 F  *CHILLS WITH OR WITHOUT FEVER  NAUSEA AND VOMITING THAT IS NOT CONTROLLED WITH YOUR NAUSEA MEDICATION  *UNUSUAL SHORTNESS OF BREATH  *UNUSUAL BRUISING OR BLEEDING  TENDERNESS IN MOUTH AND THROAT WITH OR WITHOUT PRESENCE OF ULCERS  *URINARY PROBLEMS  *BOWEL PROBLEMS  UNUSUAL RASH Items with * indicate a potential emergency and should be followed up as soon as possible.  Feel free to call the clinic you have any questions or concerns. The clinic phone number is [phone].  Please show the CHEMO ALERT CARD at check-in to the Emergency Department and triage nurse.   

## 2014-12-11 NOTE — Assessment & Plan Note (Signed)
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 Adjuvant chemotherapy with dose dense Adriamycin and Cytoxan x 4 followed by Abraxane weekly 12 because of lymph node positive disease. 2. Followed by adjuvant radiation 3. Followed by antiestrogen therapy ----------------------------------------------------------------------------------------------------------------------------------- Current Treatment: completed 4 cycles of dose dense Adriamycin and Cytoxan (Prev held chemotherapy for thrombocytopenia, dose reduction for neutropenia), today is cycle 5 Abraxane weekly 12  Chemotoxicities: 1. Hospitalization for neutropenic fever after cycle 1: Blood cultures were all been negative as well as chest x-ray. She was given antibiotics and discharged home. 2. Fatigue: Reduced the dosage of chemotherapy for cycle 2 and further with cycle 3. Will reduce again with 4th cycle. 3. Thrombocytopenia: Platelets 61 10/02/2014: Held chemotherapy, dose reduction with cycle 3. Platelets 53 10/24/2014: held chemotherapy, dose reduction with cycle 4. Monitoring platelets closely 4. Fevers 09/18/2014: W/U negative for source of infection, patient has persistent fevers as high as 102.8 and they get better with Tylenol. Fevers have been persistent throughout chemotherapy. Not infectious in etiology. 5. Anemia due to chemotherapy: symptomatic anemia with hgb 8.2. Improvement to 11.3 after 2 units of blood on 10/23/14, hemoglobin 9.3 on 11/27/2014 6. Neutropenia: managed with neupogen injections after chemo treatments with cycle 3. But  she had intractable back spasms and does not want to use Neupogen anymore. 7. Headaches: continue tylenol. Use massage or icy hot to shoulders and neck area  Monitoring very closely for toxicities. Patient understands that we might have to do dose reductions if her neutrophil count does not stay within the range for treatment. Return to clinic in 1 week for cycle 6/12 Abraxane and for toxicity check

## 2014-12-11 NOTE — Progress Notes (Signed)
Patient Care Team: Chipper Herb, MD as PCP - General (Family Medicine)  DIAGNOSIS: No matching staging information was found for the patient.  SUMMARY OF ONCOLOGIC HISTORY:   Breast cancer of upper-outer quadrant of left female breast ([place])   07/18/2014 Mammogram Distortion left breast, breast density category C; U/S 1.3 x 0.8 x 0.8 cm left breast mass at 2:00 position 4 cm from nipple, no lymph nodes   07/20/2014 Initial Diagnosis Left breast Biopsy: Invasive lobular cancer with LCIS, Grade 1, ER 86%, PR 78%, Her 2 Neg Ratio 1.77, Ki 67: 14%   08/01/2014 Breast MRI Breast MRI showed non-mass enhancement 3.9 cm, no lymph nodes   08/08/2014 Surgery Left breast lumpectomy: 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, with LCIS, medial and inferior margins positive, 3/4 lymph nodes positive T2 N1 cM0 stage IIB   08/16/2014 Surgery Left breast medial margin reexcision residual ILC 0.2 cm; inferior margin residual foci less than 0.2 cm, 1/5 lymph nodes positive, 1 lymph node with isolated tumor cells (Overall 4/10)   09/04/2014 -  Chemotherapy Adjuvant chemotherapy with dose dense Adriamycin and Cytoxan 4 followed by Abraxane weekly 12    CHIEF COMPLIANT: Abraxane cycle 5  INTERVAL HISTORY: [name] is a 54 year old with above-mentioned history of left breast cancer currently on adjuvant chemotherapy. She is currently on Abraxane. Today's cycle 5 of treatment. She appears to be tolerating this dose extremely well. Her blood counts reviewed from today and they are adequate for treatment. She continues to have intermittent fevers. She complained of cramps in the back intermittently. She also complains of a dry cough and mild shortness of breath or exertion. Denies neuropathy She also has moderate to severe degree of fatigue after each chemotherapy.  REVIEW OF SYSTEMS:   Constitutional: Denies fevers, chills or abnormal weight loss Eyes: Denies blurriness of vision Ears, nose, mouth, throat, and  face: Denies mucositis or sore throat Respiratory: Dry cough and shortness of breath exertion Cardiovascular: Denies palpitation, chest discomfort or lower extremity swelling Gastrointestinal:  Denies nausea, heartburn or change in bowel habits Skin: Denies abnormal skin rashes Lymphatics: Denies new lymphadenopathy or easy bruising Neurological:Denies numbness, tingling or new weaknesses Behavioral/Psych: Mood is stable, no new changes  All other systems were reviewed with the patient and are negative.  I have reviewed the past medical history, past surgical history, social history and family history with the patient and they are unchanged from previous note.  ALLERGIES:  is allergic to crestor; lipitor; and temazepam.  MEDICATIONS:  Current Outpatient Prescriptions  Medication Sig Dispense Refill  . acetaminophen (TYLENOL) 325 MG tablet Take 650 mg by mouth every 6 (six) hours as needed for moderate pain.    . clonazePAM (KLONOPIN) 0.5 MG tablet TAKE 1 TABLET THREE TIMES A DAY 90 tablet 2  . dexamethasone (DECADRON) 4 MG tablet TAKE 1 TAB ONCE A DAY ON THE DAY AFTER CHEMO. THEN TAKE 1 TAB TWICE DAILY FOR 2DAYS. TAKE WITH FOOD  1  . hydroxypropyl methylcellulose / hypromellose (ISOPTO TEARS / GONIOVISC) 2.5 % ophthalmic solution Place 1 drop into both eyes 3 (three) times daily as needed for dry eyes.    Marland Kitchen lidocaine-prilocaine (EMLA) cream APPLY TO AFFECTED AREA ONCE  3  . LORazepam (ATIVAN) 0.5 MG tablet Take 1 tablet (0.5 mg total) by mouth at bedtime. 30 tablet 0  . ondansetron (ZOFRAN) 8 MG tablet Take 1 tablet (8 mg total) by mouth 2 (two) times daily. Start the day after chemo for  2 days. Then take as needed for nausea or vomiting. 30 tablet 1  . PARoxetine (PAXIL-CR) 25 MG 24 hr tablet TAKE 1 TABLET BY MOUTH EVERY DAY 90 tablet 2  . prochlorperazine (COMPAZINE) 10 MG tablet Take 1 tablet (10 mg total) by mouth every 6 (six) hours as needed (Nausea or vomiting). 30 tablet 1   No  current facility-administered medications for this visit.    PHYSICAL EXAMINATION: ECOG PERFORMANCE STATUS: 1 - Symptomatic but completely ambulatory  Filed Vitals:   12/11/14 0839  BP: 129/62  Pulse: 100  Temp: 98.8 F (37.1 C)  Resp: 18   Filed Weights   12/11/14 0839  Weight: 139 lb 14.4 oz (63.458 kg)    GENERAL:alert, no distress and comfortable SKIN: skin color, texture, turgor are normal, no rashes or significant lesions EYES: normal, Conjunctiva are pink and non-injected, sclera clear OROPHARYNX:no exudate, no erythema and lips, buccal mucosa, and tongue normal  NECK: supple, thyroid normal size, non-tender, without nodularity LYMPH:  no palpable lymphadenopathy in the cervical, axillary or inguinal LUNGS: clear to auscultation and percussion with normal breathing effort HEART: regular rate & rhythm and no murmurs and no lower extremity edema ABDOMEN:abdomen soft, non-tender and normal bowel sounds Musculoskeletal:no cyanosis of digits and no clubbing  NEURO: alert & oriented x 3 with fluent speech, no focal motor/sensory deficits  LABORATORY DATA:  I have reviewed the data as listed   Chemistry      Component Value Date/Time   NA 140 12/04/2014 0803   NA 132* 09/09/2014 2336   NA 141 07/21/2013 1023   K 3.8 12/04/2014 0803   K 3.6 09/09/2014 2336   CL 99* 09/09/2014 2336   CO2 26 12/04/2014 0803   CO2 25 09/09/2014 2336   BUN 6.4* 12/04/2014 0803   BUN 15 09/09/2014 2336   BUN 14 07/21/2013 1023   CREATININE 0.6 12/04/2014 0803   CREATININE 0.43* 09/09/2014 2336   CREATININE 0.54 10/04/2012 1123      Component Value Date/Time   CALCIUM 8.8 12/04/2014 0803   CALCIUM 8.2* 09/09/2014 2336   ALKPHOS 86 12/04/2014 0803   ALKPHOS 70 09/09/2014 2336   AST 27 12/04/2014 0803   AST 19 09/09/2014 2336   ALT 16 12/04/2014 0803   ALT 26 09/09/2014 2336   BILITOT 0.55 12/04/2014 0803   BILITOT 0.8 09/09/2014 2336       Lab Results  Component Value Date    WBC 4.6 12/11/2014   HGB 10.1* 12/11/2014   HCT 30.9* 12/11/2014   MCV 100.3 12/11/2014   PLT 171 12/11/2014   NEUTROABS 2.6 12/11/2014   ASSESSMENT & PLAN:  Breast cancer of upper-outer quadrant of left female breast Left breast lumpectomy 08/08/2014: 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 Adjuvant chemotherapy with dose dense Adriamycin and Cytoxan x 4 followed by Abraxane weekly 12 because of lymph node positive disease. 2. Followed by adjuvant radiation 3. Followed by antiestrogen therapy ----------------------------------------------------------------------------------------------------------------------------------- Current Treatment: completed 4 cycles of dose dense Adriamycin and Cytoxan (Prev held chemotherapy for thrombocytopenia, dose reduction for neutropenia), today is cycle 5 Abraxane weekly 12  Chemotoxicities: 1. Hospitalization for  neutropenic fever after cycle 1: Blood cultures were all been negative as well as chest x-ray. She was given antibiotics and discharged home. 2. Fatigue: Reduced the dosage of chemotherapy for cycle 2 and further with cycle 3. Will reduce again with 4th cycle. 3. Thrombocytopenia: Platelets 61 10/02/2014: Held chemotherapy, dose reduction with cycle 3. Platelets 53 10/24/2014: held chemotherapy, dose reduction with cycle 4. Monitoring platelets closely 4. Fevers 09/18/2014: W/U negative for source of infection, patient has persistent fevers as high as 102.8 and they get better with Tylenol. Fevers have been persistent throughout chemotherapy. Not infectious in etiology. 5.  Anemia due to chemotherapy: symptomatic anemia with hgb 8.2. Improvement to 11.3 after 2 units of blood on 10/23/14, hemoglobin 9.3 on 11/27/2014 6. Neutropenia: managed with neupogen injections after chemo treatments with cycle 3. But she had intractable back spasms and does not want to use Neupogen anymore. 7. Headaches: continue tylenol. Use massage or icy hot to shoulders and neck area  Monitoring very closely for toxicities. Return to clinic in 1 week for cycle 6/12 Abraxane and for toxicity check   No orders of the defined types were placed in this encounter.   The patient has a good understanding of the overall plan. she agrees with it. she will call with any problems that may develop before the next visit here.   Rulon Eisenmenger, MD

## 2014-12-11 NOTE — Telephone Encounter (Signed)
appointments made and avs printed for patient °

## 2014-12-11 NOTE — Telephone Encounter (Signed)
Last filled 11/12/14. Last seen 07/21/13? If approved , call in at [facility]

## 2014-12-12 ENCOUNTER — Other Ambulatory Visit: Payer: PRIVATE HEALTH INSURANCE

## 2014-12-12 ENCOUNTER — Ambulatory Visit: Payer: PRIVATE HEALTH INSURANCE

## 2014-12-17 NOTE — Assessment & Plan Note (Signed)
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 Adjuvant chemotherapy with dose dense Adriamycin and Cytoxan x 4 followed by Abraxane weekly 12 because of lymph node positive disease. 2. Followed by adjuvant radiation 3. Followed by antiestrogen therapy ----------------------------------------------------------------------------------------------------------------------------------- Current Treatment: completed 4 cycles of dose dense Adriamycin and Cytoxan (Prev held chemotherapy for thrombocytopenia, dose reduction for neutropenia), today is cycle 6 Abraxane weekly 12  Chemotoxicities: 1. Hospitalization for neutropenic fever after cycle 1: Blood cultures were all been negative as well as chest x-ray. She was given antibiotics and discharged home. 2. Fatigue: Reduced the dosage of chemotherapy for cycle 2 and further with cycle 3. Will reduce again with 4th cycle. 3. Thrombocytopenia: Platelets 61 10/02/2014: Held chemotherapy, dose reduction with cycle 3. Platelets 53 10/24/2014: held chemotherapy, dose reduction with cycle 4. Monitoring platelets closely 4. Fevers 09/18/2014: W/U negative for source of infection, patient has persistent fevers as high as 102.8 and they get better with Tylenol. Fevers have been persistent throughout chemotherapy. Not infectious in etiology. 5. Anemia due to chemotherapy: symptomatic anemia with hgb 8.2. Improvement to 11.3 after 2 units of blood on 10/23/14, hemoglobin 9.3 on 11/27/2014 6. Neutropenia: managed with neupogen injections after chemo treatments with cycle 3. But  she had intractable back spasms and does not want to use Neupogen anymore. 7. Headaches: continue tylenol. Use massage or icy hot to shoulders and neck area  Monitoring very closely for toxicities. Return to clinic in 1 week for cycle 7/12 Abraxane and for toxicity check

## 2014-12-18 ENCOUNTER — Ambulatory Visit (HOSPITAL_BASED_OUTPATIENT_CLINIC_OR_DEPARTMENT_OTHER): Payer: PRIVATE HEALTH INSURANCE

## 2014-12-18 ENCOUNTER — Ambulatory Visit (HOSPITAL_BASED_OUTPATIENT_CLINIC_OR_DEPARTMENT_OTHER): Payer: PRIVATE HEALTH INSURANCE | Admitting: Hematology and Oncology

## 2014-12-18 ENCOUNTER — Other Ambulatory Visit (HOSPITAL_BASED_OUTPATIENT_CLINIC_OR_DEPARTMENT_OTHER): Payer: PRIVATE HEALTH INSURANCE

## 2014-12-18 ENCOUNTER — Encounter: Payer: Self-pay | Admitting: Hematology and Oncology

## 2014-12-18 ENCOUNTER — Telehealth: Payer: Self-pay | Admitting: Hematology and Oncology

## 2014-12-18 VITALS — BP 128/78 | HR 102 | Temp 98.7°F | Resp 18 | Ht 65.0 in | Wt 138.1 lb

## 2014-12-18 DIAGNOSIS — Z5111 Encounter for antineoplastic chemotherapy: Secondary | ICD-10-CM | POA: Diagnosis not present

## 2014-12-18 DIAGNOSIS — R509 Fever, unspecified: Secondary | ICD-10-CM | POA: Diagnosis not present

## 2014-12-18 DIAGNOSIS — Z170 Estrogen receptor positive status [ER+]: Secondary | ICD-10-CM

## 2014-12-18 DIAGNOSIS — C50412 Malignant neoplasm of upper-outer quadrant of left female breast: Secondary | ICD-10-CM

## 2014-12-18 DIAGNOSIS — R530 Neoplastic (malignant) related fatigue: Secondary | ICD-10-CM

## 2014-12-18 DIAGNOSIS — R51 Headache: Secondary | ICD-10-CM

## 2014-12-18 DIAGNOSIS — D6481 Anemia due to antineoplastic chemotherapy: Secondary | ICD-10-CM

## 2014-12-18 LAB — COMPREHENSIVE METABOLIC PANEL (CC13)
ALBUMIN: 3.6 g/dL (ref 3.5–5.0)
ALK PHOS: 89 U/L (ref 40–150)
ALT: 23 U/L (ref 0–55)
ANION GAP: 11 meq/L (ref 3–11)
AST: 34 U/L (ref 5–34)
BUN: 8.5 mg/dL (ref 7.0–26.0)
CALCIUM: 9.1 mg/dL (ref 8.4–10.4)
CHLORIDE: 107 meq/L (ref 98–109)
CO2: 20 mEq/L — ABNORMAL LOW (ref 22–29)
CREATININE: 0.6 mg/dL (ref 0.6–1.1)
EGFR: >90 mL/min/{1.73_m2} (ref >90)
Glucose: 126 mg/dl (ref 70–140)
POTASSIUM: 3.4 meq/L — AB (ref 3.5–5.1)
Sodium: 138 mEq/L (ref 136–145)
Total Bilirubin: 0.41 mg/dL (ref 0.20–1.20)
Total Protein: 6.5 g/dL (ref 6.4–8.3)

## 2014-12-18 LAB — CBC WITH DIFFERENTIAL/PLATELET
BASO%: 0.8 % (ref 0.0–2.0)
BASOS ABS: 0 10*3/uL (ref 0.0–0.1)
EOS ABS: 0.2 10*3/uL (ref 0.0–0.5)
EOS%: 3 % (ref 0.0–7.0)
HEMATOCRIT: 33 % — AB (ref 34.8–46.6)
HEMOGLOBIN: 11 g/dL — AB (ref 11.6–15.9)
LYMPH#: 1.6 10*3/uL (ref 0.9–3.3)
LYMPH%: 28.3 % (ref 14.0–49.7)
MCH: 32.7 pg (ref 25.1–34.0)
MCHC: 33.4 g/dL (ref 31.5–36.0)
MCV: 98.1 fL (ref 79.5–101.0)
MONO#: 0.6 10*3/uL (ref 0.1–0.9)
MONO%: 11.5 % (ref 0.0–14.0)
NEUT#: 3.2 10*3/uL (ref 1.5–6.5)
NEUT%: 56.4 % (ref 38.4–76.8)
PLATELETS: 194 10*3/uL (ref 145–400)
RBC: 3.36 10*6/uL — ABNORMAL LOW (ref 3.70–5.45)
RDW: 20.5 % — AB (ref 11.2–14.5)
WBC: 5.7 10*3/uL (ref 3.9–10.3)

## 2014-12-18 MED ORDER — SODIUM CHLORIDE 0.9 % IV SOLN
Freq: Once | INTRAVENOUS | Status: AC
  Administered 2014-12-18: 09:00:00 via INTRAVENOUS

## 2014-12-18 MED ORDER — PACLITAXEL PROTEIN-BOUND CHEMO INJECTION 100 MG
65.0000 mg/m2 | Freq: Once | INTRAVENOUS | Status: AC
  Administered 2014-12-18: 100 mg via INTRAVENOUS
  Filled 2014-12-18: qty 20

## 2014-12-18 MED ORDER — HEPARIN SOD (PORK) LOCK FLUSH 100 UNIT/ML IV SOLN
500.0000 [IU] | Freq: Once | INTRAVENOUS | Status: AC | PRN
  Administered 2014-12-18: 500 [IU]
  Filled 2014-12-18: qty 5

## 2014-12-18 MED ORDER — PALONOSETRON HCL INJECTION 0.25 MG/5ML
0.2500 mg | Freq: Once | INTRAVENOUS | Status: AC
  Administered 2014-12-18: 0.25 mg via INTRAVENOUS

## 2014-12-18 MED ORDER — SODIUM CHLORIDE 0.9 % IJ SOLN
10.0000 mL | INTRAMUSCULAR | Status: DC | PRN
  Administered 2014-12-18: 10 mL
  Filled 2014-12-18: qty 10

## 2014-12-18 MED ORDER — PALONOSETRON HCL INJECTION 0.25 MG/5ML
INTRAVENOUS | Status: AC
  Filled 2014-12-18: qty 5

## 2014-12-18 NOTE — Patient Instructions (Signed)

## 2014-12-18 NOTE — Telephone Encounter (Signed)
Echo to [place] for precert

## 2014-12-18 NOTE — Progress Notes (Signed)
Patient Care Team: Chipper Herb, MD as PCP - General (Family Medicine)  DIAGNOSIS: No matching staging information was found for the patient.  SUMMARY OF ONCOLOGIC HISTORY:   Breast cancer of upper-outer quadrant of left female breast ([place])   07/18/2014 Mammogram Distortion left breast, breast density category C; U/S 1.3 x 0.8 x 0.8 cm left breast mass at 2:00 position 4 cm from nipple, no lymph nodes   07/20/2014 Initial Diagnosis Left breast Biopsy: Invasive lobular cancer with LCIS, Grade 1, ER 86%, PR 78%, Her 2 Neg Ratio 1.77, Ki 67: 14%   08/01/2014 Breast MRI Breast MRI showed non-mass enhancement 3.9 cm, no lymph nodes   08/08/2014 Surgery Left breast lumpectomy: 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, with LCIS, medial and inferior margins positive, 3/4 lymph nodes positive T2 N1 cM0 stage IIB   08/16/2014 Surgery Left breast medial margin reexcision residual ILC 0.2 cm; inferior margin residual foci less than 0.2 cm, 1/5 lymph nodes positive, 1 lymph node with isolated tumor cells (Overall 4/10)   09/04/2014 -  Chemotherapy Adjuvant chemotherapy with dose dense Adriamycin and Cytoxan 4 followed by Abraxane weekly 12    CHIEF COMPLIANT: Cycle 6 of Abraxane  INTERVAL HISTORY: [name] is a 54 year old with above-mentioned history of left breast cancer currently on adjuvant chemotherapy with Abraxane weekly. She appears to be tolerating it fairly well. Her major complaints are fatigue related to chemotherapy along with intermittent fevers. These fevers have been previously worked up and was not found to be infectious. We did not treat her with antibiotics. Patient denies any neuropathy. Denies any nausea or vomiting. Left posterior rib discomfort.  REVIEW OF SYSTEMS:   Constitutional: Denies fevers, chills or abnormal weight loss Eyes: Denies blurriness of vision Ears, nose, mouth, throat, and face: Denies mucositis or sore throat Respiratory: Denies cough, dyspnea or  wheezes Cardiovascular: Denies palpitation, chest discomfort or lower extremity swelling Gastrointestinal:  Denies nausea, heartburn or change in bowel habits Skin: Denies abnormal skin rashes Lymphatics: Denies new lymphadenopathy or easy bruising Neurological:Denies numbness, tingling or new weaknesses Behavioral/Psych: Mood is stable, no new changes  Breast:  denies any pain or lumps or nodules in either breasts All other systems were reviewed with the patient and are negative.  I have reviewed the past medical history, past surgical history, social history and family history with the patient and they are unchanged from previous note.  ALLERGIES:  is allergic to crestor; lipitor; and temazepam.  MEDICATIONS:  Current Outpatient Prescriptions  Medication Sig Dispense Refill  . acetaminophen (TYLENOL) 325 MG tablet Take 650 mg by mouth every 6 (six) hours as needed for moderate pain.    . clonazePAM (KLONOPIN) 0.5 MG tablet TAKE 1 TABLET THREE TIMES A DAY AS NEEDED 90 tablet 0  . dexamethasone (DECADRON) 4 MG tablet TAKE 1 TAB ONCE A DAY ON THE DAY AFTER CHEMO. THEN TAKE 1 TAB TWICE DAILY FOR 2DAYS. TAKE WITH FOOD  1  . hydroxypropyl methylcellulose / hypromellose (ISOPTO TEARS / GONIOVISC) 2.5 % ophthalmic solution Place 1 drop into both eyes 3 (three) times daily as needed for dry eyes.    Marland Kitchen lidocaine-prilocaine (EMLA) cream APPLY TO AFFECTED AREA ONCE  3  . LORazepam (ATIVAN) 0.5 MG tablet Take 1 tablet (0.5 mg total) by mouth at bedtime. 30 tablet 0  . ondansetron (ZOFRAN) 8 MG tablet Take 1 tablet (8 mg total) by mouth 2 (two) times daily. Start the day after chemo for 2 days. Then  take as needed for nausea or vomiting. 30 tablet 1  . PARoxetine (PAXIL-CR) 25 MG 24 hr tablet TAKE 1 TABLET BY MOUTH EVERY DAY 90 tablet 0  . prochlorperazine (COMPAZINE) 10 MG tablet Take 1 tablet (10 mg total) by mouth every 6 (six) hours as needed (Nausea or vomiting). 30 tablet 1   No current  facility-administered medications for this visit.    PHYSICAL EXAMINATION: ECOG PERFORMANCE STATUS: 1 - Symptomatic but completely ambulatory  There were no vitals filed for this visit. There were no vitals filed for this visit.  GENERAL:alert, no distress and comfortable SKIN: skin color, texture, turgor are normal, no rashes or significant lesions EYES: normal, Conjunctiva are pink and non-injected, sclera clear OROPHARYNX:no exudate, no erythema and lips, buccal mucosa, and tongue normal  NECK: supple, thyroid normal size, non-tender, without nodularity LYMPH:  no palpable lymphadenopathy in the cervical, axillary or inguinal LUNGS: clear to auscultation and percussion with normal breathing effort HEART: regular rate & rhythm and no murmurs and no lower extremity edema ABDOMEN:abdomen soft, non-tender and normal bowel sounds Musculoskeletal:no cyanosis of digits and no clubbing  NEURO: alert & oriented x 3 with fluent speech, no focal motor/sensory deficits LABORATORY DATA:  I have reviewed the data as listed   Chemistry      Component Value Date/Time   NA 139 12/11/2014 0818   NA 132* 09/09/2014 2336   NA 141 07/21/2013 1023   K 3.6 12/11/2014 0818   K 3.6 09/09/2014 2336   CL 99* 09/09/2014 2336   CO2 24 12/11/2014 0818   CO2 25 09/09/2014 2336   BUN 4.9* 12/11/2014 0818   BUN 15 09/09/2014 2336   BUN 14 07/21/2013 1023   CREATININE 0.6 12/11/2014 0818   CREATININE 0.43* 09/09/2014 2336   CREATININE 0.54 10/04/2012 1123      Component Value Date/Time   CALCIUM 8.8 12/11/2014 0818   CALCIUM 8.2* 09/09/2014 2336   ALKPHOS 80 12/11/2014 0818   ALKPHOS 70 09/09/2014 2336   AST 31 12/11/2014 0818   AST 19 09/09/2014 2336   ALT 22 12/11/2014 0818   ALT 26 09/09/2014 2336   BILITOT 0.41 12/11/2014 0818   BILITOT 0.8 09/09/2014 2336       Lab Results  Component Value Date   WBC 4.6 12/11/2014   HGB 10.1* 12/11/2014   HCT 30.9* 12/11/2014   MCV 100.3 12/11/2014    PLT 171 12/11/2014   NEUTROABS 2.6 12/11/2014   ASSESSMENT & PLAN:  Breast cancer of upper-outer quadrant of left female breast Left breast lumpectomy 08/08/2014: 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 Adjuvant chemotherapy with dose dense Adriamycin and Cytoxan x 4 followed by Abraxane weekly 12 because of lymph node positive disease. 2. Followed by adjuvant radiation 3. Followed by antiestrogen therapy ----------------------------------------------------------------------------------------------------------------------------------- Current Treatment: completed 4 cycles of dose dense Adriamycin and Cytoxan (Prev held chemotherapy for thrombocytopenia, dose reduction for neutropenia), today is cycle 6 Abraxane weekly 12  Chemotoxicities: 1. Hospitalization for neutropenic fever after cycle 1 AC: Blood cultures were all been negative as well as chest x-ray. She was given antibiotics and discharged home. 2. Fatigue: Reduced  the dosage of chemotherapy for cycle 2 and further with cycle 3. Will reduce again with 4th cycle. 3. Thrombocytopenia: Platelets 61 10/02/2014: Held chemotherapy, dose reduction with cycle 3. Platelets 53 10/24/2014: held chemotherapy, dose reduction with cycle 4. Monitoring platelets closely 4. Fevers Fevers have been persistent throughout chemotherapy. Not infectious in etiology. 5. Anemia due to chemotherapy: symptomatic anemia with hgb 8.2. Improvement to 11.3 after 2 units of blood on 10/23/14, hemoglobin 10.1 on 12/11/2014 6. Neutropenia: Was previously given neupogen injections after chemo treatments  with cycle 3. But she had intractable back spasms and stopped them. Once chemo dose was adjusted, neutropenia has resolved. 7. Headaches: continue tylenol. Use massage or icy hot to shoulders and neck area 8. Shortness of breath: Unclear etiology. Her hemoglobin is improving but her breathing has gotten worse. It could all be related to fatigue. I would like to check an echocardiogram on the heart, given the fact that she received Adriamycin previously. 9. Left posterior rib discomfort: We will get a bone scan to evaluate this further. I discussed with her that a PET scan would not be indicated while she is on chemotherapy.  Monitoring very closely for toxicities. Return to clinic in 1 week for cycle 7/12 Abraxane and for toxicity check   No orders of the defined types were placed in this encounter.   The patient has a good understanding of the overall plan. she agrees with it. she will call with any problems that may develop before the next visit here.   ,  K, MD      

## 2014-12-20 ENCOUNTER — Encounter (HOSPITAL_COMMUNITY)
Admission: RE | Admit: 2014-12-20 | Discharge: 2014-12-20 | Disposition: A | Discharge status: Home / Self Care | Payer: PRIVATE HEALTH INSURANCE | Source: Ambulatory Visit | Attending: Hematology and Oncology | Admitting: Hematology and Oncology

## 2014-12-20 DIAGNOSIS — C50412 Malignant neoplasm of upper-outer quadrant of left female breast: Secondary | ICD-10-CM | POA: Diagnosis not present

## 2014-12-20 MED ORDER — TECHNETIUM TC 99M MEDRONATE IV KIT
26.0000 | PACK | Freq: Once | INTRAVENOUS | Status: AC | PRN
  Administered 2014-12-20: 26 via INTRAVENOUS

## 2014-12-21 ENCOUNTER — Other Ambulatory Visit: Payer: Self-pay | Admitting: *Deleted

## 2014-12-21 ENCOUNTER — Telehealth: Payer: Self-pay | Admitting: Hematology and Oncology

## 2014-12-21 MED ORDER — MAGIC MOUTHWASH W/LIDOCAINE: 5.0000 mL | Freq: Four times a day (QID) | ORAL | Status: DC | PRN

## 2014-12-21 NOTE — Telephone Encounter (Signed)
Pt called c/o "blisters on my tongue". Per Dr. Lindi Adie script sent in for magic mouth wash. Confirmed echo appt for 10/19. Pt relayed she is having a dry cough. Per Dr. Lindi Adie discussed with pt taking OTC acid reflux reducer. Received verbal understanding. Encourage pt to call with further questions or concerns.

## 2014-12-21 NOTE — Telephone Encounter (Signed)
Called and left a message with her echo appt

## 2014-12-24 NOTE — Assessment & Plan Note (Signed)
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 Adjuvant chemotherapy with dose dense Adriamycin and Cytoxan x 4 followed by Abraxane weekly 12 because of lymph node positive disease. 2. Followed by adjuvant radiation 3. Followed by antiestrogen therapy ----------------------------------------------------------------------------------------------------------------------------------- Current Treatment: completed 4 cycles of dose dense Adriamycin and Cytoxan (Prev held chemotherapy for thrombocytopenia, dose reduction for neutropenia), today is cycle 7 Abraxane weekly 12  Chemotoxicities: 1. Hospitalization for neutropenic fever after cycle 1 AC: Blood cultures were all been negative as well as chest x-ray. She was given antibiotics and discharged home. 2. Fatigue: Reduced the dosage of chemotherapy for cycle 2 and further with cycle 3. Will reduce again with 4th cycle. 3. Thrombocytopenia: Platelets 61 10/02/2014: Held chemotherapy, dose reduction with cycle 3. Platelets 53 10/24/2014: held chemotherapy, dose reduction with cycle 4. Monitoring platelets closely 4. Fevers Fevers have been persistent throughout chemotherapy. Not infectious in etiology. 5. Anemia due to chemotherapy: symptomatic anemia with hgb 8.2. Improvement to 11.3 after 2 units of blood on 10/23/14, hemoglobin 10.1 on 12/11/2014 6. Neutropenia: Was previously given neupogen injections after chemo treatments with cycle 3. But she had intractable back spasms and stopped them. Once chemo dose was adjusted, neutropenia has resolved. 7.  Headaches: continue tylenol. Use massage or icy hot to shoulders and neck area 8. Shortness of breath: Unclear etiology. Her hemoglobin is improving but her breathing has gotten worse. It could all be related to fatigue. I would like to check an echocardiogram on the heart, given the fact that she received Adriamycin previously. 9. Left posterior rib discomfort: We will get a bone scan to evaluate this further. I discussed with her that a PET scan would not be indicated while she is on chemotherapy.  Monitoring very closely for toxicities. Return to clinic in 1 week for cycle 8/12 Abraxane and for toxicity check

## 2014-12-25 ENCOUNTER — Ambulatory Visit (HOSPITAL_BASED_OUTPATIENT_CLINIC_OR_DEPARTMENT_OTHER): Payer: PRIVATE HEALTH INSURANCE | Admitting: Hematology and Oncology

## 2014-12-25 ENCOUNTER — Encounter: Payer: Self-pay | Admitting: Hematology and Oncology

## 2014-12-25 ENCOUNTER — Ambulatory Visit (HOSPITAL_BASED_OUTPATIENT_CLINIC_OR_DEPARTMENT_OTHER): Payer: PRIVATE HEALTH INSURANCE

## 2014-12-25 ENCOUNTER — Other Ambulatory Visit (HOSPITAL_BASED_OUTPATIENT_CLINIC_OR_DEPARTMENT_OTHER): Payer: PRIVATE HEALTH INSURANCE

## 2014-12-25 VITALS — BP 116/76 | HR 93 | Temp 98.3°F | Resp 18 | Ht 65.0 in | Wt 136.2 lb

## 2014-12-25 DIAGNOSIS — Z5111 Encounter for antineoplastic chemotherapy: Secondary | ICD-10-CM | POA: Diagnosis not present

## 2014-12-25 DIAGNOSIS — C50412 Malignant neoplasm of upper-outer quadrant of left female breast: Secondary | ICD-10-CM | POA: Diagnosis not present

## 2014-12-25 DIAGNOSIS — R51 Headache: Secondary | ICD-10-CM

## 2014-12-25 DIAGNOSIS — Z170 Estrogen receptor positive status [ER+]: Secondary | ICD-10-CM

## 2014-12-25 DIAGNOSIS — D6481 Anemia due to antineoplastic chemotherapy: Secondary | ICD-10-CM

## 2014-12-25 DIAGNOSIS — R530 Neoplastic (malignant) related fatigue: Secondary | ICD-10-CM

## 2014-12-25 DIAGNOSIS — R509 Fever, unspecified: Secondary | ICD-10-CM | POA: Diagnosis not present

## 2014-12-25 LAB — COMPREHENSIVE METABOLIC PANEL (CC13)
ALBUMIN: 3.5 g/dL (ref 3.5–5.0)
ALT: 18 U/L (ref 0–55)
ANION GAP: 11 meq/L (ref 3–11)
AST: 29 U/L (ref 5–34)
Alkaline Phosphatase: 91 U/L (ref 40–150)
BILIRUBIN TOTAL: 0.5 mg/dL (ref 0.20–1.20)
BUN: 7.4 mg/dL (ref 7.0–26.0)
CALCIUM: 9.2 mg/dL (ref 8.4–10.4)
CHLORIDE: 106 meq/L (ref 98–109)
CO2: 22 mEq/L (ref 22–29)
CREATININE: 0.6 mg/dL (ref 0.6–1.1)
EGFR: >90 mL/min/{1.73_m2} (ref >90)
Glucose: 111 mg/dl (ref 70–140)
Potassium: 3.6 mEq/L (ref 3.5–5.1)
Sodium: 139 mEq/L (ref 136–145)
TOTAL PROTEIN: 7 g/dL (ref 6.4–8.3)

## 2014-12-25 LAB — CBC WITH DIFFERENTIAL/PLATELET
BASO%: 0.3 % (ref 0.0–2.0)
Basophils Absolute: 0 10*3/uL (ref 0.0–0.1)
EOS%: 0.8 % (ref 0.0–7.0)
Eosinophils Absolute: 0.1 10*3/uL (ref 0.0–0.5)
HEMATOCRIT: 33.7 % — AB (ref 34.8–46.6)
HEMOGLOBIN: 11.2 g/dL — AB (ref 11.6–15.9)
LYMPH#: 1.8 10*3/uL (ref 0.9–3.3)
LYMPH%: 31.1 % (ref 14.0–49.7)
MCH: 33 pg (ref 25.1–34.0)
MCHC: 33.2 g/dL (ref 31.5–36.0)
MCV: 99.4 fL (ref 79.5–101.0)
MONO#: 0.5 10*3/uL (ref 0.1–0.9)
MONO%: 8.6 % (ref 0.0–14.0)
NEUT%: 59.2 % (ref 38.4–76.8)
NEUTROS ABS: 3.5 10*3/uL (ref 1.5–6.5)
PLATELETS: 155 10*3/uL (ref 145–400)
RBC: 3.39 10*6/uL — ABNORMAL LOW (ref 3.70–5.45)
RDW: 17.4 % — AB (ref 11.2–14.5)
WBC: 5.9 10*3/uL (ref 3.9–10.3)

## 2014-12-25 MED ORDER — PACLITAXEL PROTEIN-BOUND CHEMO INJECTION 100 MG
65.0000 mg/m2 | Freq: Once | INTRAVENOUS | Status: AC
  Administered 2014-12-25: 100 mg via INTRAVENOUS
  Filled 2014-12-25: qty 20

## 2014-12-25 MED ORDER — SODIUM CHLORIDE 0.9 % IJ SOLN
10.0000 mL | INTRAMUSCULAR | Status: DC | PRN
  Administered 2014-12-25: 10 mL
  Filled 2014-12-25: qty 10

## 2014-12-25 MED ORDER — PALONOSETRON HCL INJECTION 0.25 MG/5ML
INTRAVENOUS | Status: AC
  Filled 2014-12-25: qty 5

## 2014-12-25 MED ORDER — SODIUM CHLORIDE 0.9 % IV SOLN
Freq: Once | INTRAVENOUS | Status: AC
  Administered 2014-12-25: 11:00:00 via INTRAVENOUS

## 2014-12-25 MED ORDER — PALONOSETRON HCL INJECTION 0.25 MG/5ML
0.2500 mg | Freq: Once | INTRAVENOUS | Status: AC
  Administered 2014-12-25: 0.25 mg via INTRAVENOUS

## 2014-12-25 MED ORDER — HEPARIN SOD (PORK) LOCK FLUSH 100 UNIT/ML IV SOLN
500.0000 [IU] | Freq: Once | INTRAVENOUS | Status: AC | PRN
  Administered 2014-12-25: 500 [IU]
  Filled 2014-12-25: qty 5

## 2014-12-25 MED ORDER — MAGIC MOUTHWASH W/LIDOCAINE: 5.0000 mL | Freq: Four times a day (QID) | ORAL | Status: DC | PRN

## 2014-12-25 NOTE — Progress Notes (Signed)
Patient Care Team: Chipper Herb, MD as PCP - General (Family Medicine)  DIAGNOSIS: No matching staging information was found for the patient.  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 She appears to be tolerating Abraxane moderately well. She does have fatigue, frequent fevers, recent onset of mouth sores. Initially it appeared to be thrush and she was prescribed nystatin. Much of the thrush symptoms of improved she continues to have discomfort in the back of the throat. Her fevers this morning of 102.8. They are now normal at 98. Almost everyday she gets a temperature that is 101 or less. This has happened throughout her chemotherapy treatment. We have not identified any source of infection. We are not giving her antibiotics.  REVIEW OF SYSTEMS:   Constitutional: Denies fevers,  chills or abnormal weight loss, fevers Eyes: Denies blurriness of vision Ears, nose, mouth, throat, and face: Denies mucositis or sore throat Respiratory: Denies cough, dyspnea or wheezes Cardiovascular: Denies palpitation, chest discomfort or lower extremity swelling Gastrointestinal:  Denies nausea, heartburn or change in bowel habits Skin: Denies abnormal skin rashes Lymphatics: Denies new lymphadenopathy or easy bruising Neurological:Denies numbness, tingling or new weaknesses Behavioral/Psych: Mood is stable, no new changes  All other systems were reviewed with the patient and are negative.  I have reviewed the past medical history, past surgical history, social history and family history with the patient and they are unchanged from previous note.  ALLERGIES:  is allergic to crestor; lipitor; and temazepam.  MEDICATIONS:  Current Outpatient Prescriptions  Medication Sig Dispense Refill  . acetaminophen (TYLENOL) 325 MG tablet Take 650 mg by mouth every 6 (six) hours as needed for moderate pain.    . clonazePAM (KLONOPIN) 0.5 MG tablet TAKE 1 TABLET THREE TIMES A DAY AS NEEDED 90 tablet 0  . dexamethasone (DECADRON) 4 MG tablet TAKE 1 TAB ONCE A DAY ON THE DAY AFTER CHEMO. THEN TAKE 1 TAB TWICE DAILY FOR 2DAYS. TAKE WITH FOOD  1  . hydroxypropyl methylcellulose / hypromellose (ISOPTO TEARS / GONIOVISC) 2.5 % ophthalmic solution Place 1 drop into both eyes 3 (three) times daily as needed for dry eyes.    Marland Kitchen lidocaine-prilocaine (EMLA) cream APPLY TO AFFECTED AREA ONCE  3  . LORazepam (ATIVAN) 0.5 MG tablet Take 1 tablet (0.5 mg total) by mouth at bedtime. 30 tablet 0  . magic mouthwash w/lidocaine  SOLN Take 5 mLs by mouth 4 (four) times daily as needed for mouth pain. 120 mL 1  . ondansetron (ZOFRAN) 8 MG tablet Take 1 tablet (8 mg total) by mouth 2 (two) times daily. Start the day after chemo for 2 days. Then take as needed for nausea or vomiting. 30 tablet 1  . PARoxetine (PAXIL-CR) 25  MG 24 hr tablet TAKE 1 TABLET BY MOUTH EVERY DAY 90 tablet 0  . prochlorperazine (COMPAZINE) 10 MG tablet Take 1 tablet (10 mg total) by mouth every 6 (six) hours as needed (Nausea or vomiting). 30 tablet 1   No current facility-administered medications for this visit.   Facility-Administered Medications Ordered in Other Visits  Medication Dose Route Frequency Provider Last Rate Last Dose  . sodium chloride 0.9 % injection 10 mL  10 mL Intracatheter PRN Nicholas Lose, MD   10 mL at 12/25/14 1206    PHYSICAL EXAMINATION: ECOG PERFORMANCE STATUS: 1 - Symptomatic but completely ambulatory  Filed Vitals:   12/25/14 0952  BP: 116/76  Pulse: 93  Temp: 98.3 F (36.8 C)  Resp: 18   Filed Weights   12/25/14 0952  Weight: 136 lb 3.2 oz (61.78 kg)    GENERAL:alert, no distress and comfortable SKIN: skin color, texture, turgor are normal, no rashes or significant lesions EYES: normal, Conjunctiva are pink and non-injected, sclera clear OROPHARYNX:no exudate, no erythema and lips, buccal mucosa, and tongue normal  NECK: supple, thyroid normal size, non-tender, without nodularity LYMPH:  no palpable lymphadenopathy in the cervical, axillary or inguinal LUNGS: clear to auscultation and percussion with normal breathing effort HEART: regular rate & rhythm and no murmurs and no lower extremity edema ABDOMEN:abdomen soft, non-tender and normal bowel sounds Musculoskeletal:no cyanosis of digits and no clubbing  NEURO: alert & oriented x 3 with fluent speech, no focal motor/sensory deficits, 1 day where she felt neuropathy in her great toe but it resolved completely.  LABORATORY DATA:  I have reviewed the data as listed   Chemistry      Component Value Date/Time   NA 139 12/25/2014 0927   NA 132* 09/09/2014 2336   NA 141 07/21/2013 1023   K 3.6 12/25/2014 0927   K 3.6 09/09/2014 2336   CL 99* 09/09/2014 2336   CO2 22 12/25/2014 0927   CO2 25 09/09/2014 2336   BUN 7.4 12/25/2014 0927    BUN 15 09/09/2014 2336   BUN 14 07/21/2013 1023   CREATININE 0.6 12/25/2014 0927   CREATININE 0.43* 09/09/2014 2336   CREATININE 0.54 10/04/2012 1123      Component Value Date/Time   CALCIUM 9.2 12/25/2014 0927   CALCIUM 8.2* 09/09/2014 2336   ALKPHOS 91 12/25/2014 0927   ALKPHOS 70 09/09/2014 2336   AST 29 12/25/2014 0927   AST 19 09/09/2014 2336   ALT 18 12/25/2014 0927   ALT 26 09/09/2014 2336   BILITOT 0.50 12/25/2014 0927   BILITOT 0.8 09/09/2014 2336       Lab Results  Component Value Date   WBC 5.9 12/25/2014   HGB 11.2* 12/25/2014   HCT 33.7* 12/25/2014   MCV 99.4 12/25/2014   PLT 155 12/25/2014   NEUTROABS 3.5 12/25/2014   ASSESSMENT & PLAN:  Breast cancer of upper-outer quadrant of left female breast Left breast lumpectomy 08/08/2014: 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 Adjuvant chemotherapy with dose dense Adriamycin and Cytoxan x 4 followed by Abraxane weekly 12 because of lymph node positive disease. 2. Followed by adjuvant radiation 3. Followed by antiestrogen therapy ----------------------------------------------------------------------------------------------------------------------------------- Current Treatment: completed 4 cycles of dose dense Adriamycin and Cytoxan (Prev held chemotherapy for thrombocytopenia, dose reduction for neutropenia), today is cycle 7 Abraxane weekly 12  Chemotoxicities: 1. Hospitalization for neutropenic fever after cycle 1 AC: Blood cultures were all been negative as well as chest x-ray. She was given antibiotics and discharged home. 2. Fatigue: Reduced the  dosage of chemotherapy for cycle 2 and further with cycle 3. Will reduce again with 4th cycle. 3. Thrombocytopenia: Platelets 61 10/02/2014: Held chemotherapy, dose reduction with cycle 3. Platelets 53 10/24/2014: held chemotherapy, dose reduction with cycle 4. Monitoring platelets closely 4. Fevers Fevers have been persistent throughout chemotherapy. Not infectious in etiology. 5. Anemia due to chemotherapy: symptomatic anemia with hgb 8.2. Improvement to 11.3 after 2 units of blood on 10/23/14, hemoglobin 10.1 on 12/11/2014 6. Neutropenia: Was previously given neupogen injections after chemo treatments with cycle 3. But she had intractable back spasms and stopped them. Once chemo dose was adjusted, neutropenia has resolved. 7. Headaches: continue tylenol. Use massage or icy hot to shoulders and neck area 8. Shortness of breath: Unclear etiology. Her hemoglobin is improving but her breathing has gotten worse. It could all be related to fatigue.   9. Left posterior rib discomfort: Bone scan is normal on 12/20/2014 10. Mild sore: Previously treated with nystatin with moderate improvement in some symptoms but she continues to have persistent soreness in the back of the throat. I reordered nystatin today. If this does not get better then we can consider giving her Magic mouthwash.  Monitoring very closely for toxicities. Return to clinic in 1 week for cycle 8/12 Abraxane and for toxicity check  No orders of the defined types were placed in this encounter.   The patient has a good understanding of the overall plan. she agrees with it. she will call with any problems that may develop before the next visit here.   Rulon Eisenmenger, MD 12/25/2014

## 2014-12-25 NOTE — Addendum Note (Signed)
Addended by: Prentiss Bells on: 12/25/2014 01:13 PM   Modules accepted: Medications

## 2014-12-25 NOTE — Patient Instructions (Signed)

## 2014-12-27 ENCOUNTER — Ambulatory Visit (HOSPITAL_COMMUNITY)
Admission: RE | Admit: 2014-12-27 | Discharge: 2014-12-27 | Disposition: A | Discharge status: Home / Self Care | Payer: PRIVATE HEALTH INSURANCE | Source: Ambulatory Visit | Attending: Hematology and Oncology | Admitting: Hematology and Oncology

## 2014-12-27 DIAGNOSIS — E785 Hyperlipidemia, unspecified: Secondary | ICD-10-CM | POA: Insufficient documentation

## 2014-12-27 DIAGNOSIS — C50412 Malignant neoplasm of upper-outer quadrant of left female breast: Secondary | ICD-10-CM | POA: Diagnosis not present

## 2014-12-27 NOTE — Progress Notes (Signed)
*  PRELIMINARY RESULTS* Echocardiogram 2D Echocardiogram has been performed.  Leavy Cella 12/27/2014, 10:19 AM

## 2014-12-28 ENCOUNTER — Telehealth: Payer: Self-pay | Admitting: *Deleted

## 2014-12-28 NOTE — Telephone Encounter (Signed)
Called pt with negative echo results. Discussed foods to avoid while she has mouth sores.  Denies further questions or needs at this time.

## 2014-12-31 NOTE — Assessment & Plan Note (Addendum)
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 Adjuvant chemotherapy with dose dense Adriamycin and Cytoxan x 4 followed by Abraxane weekly 12 because of lymph node positive disease. 2. Followed by adjuvant radiation 3. Followed by antiestrogen therapy ----------------------------------------------------------------------------------------------------------------------------------- Current Treatment: completed 4 cycles of dose dense Adriamycin and Cytoxan (Prev held chemotherapy for thrombocytopenia, dose reduction for neutropenia), today is cycle 8 Abraxane weekly 12  Chemotoxicities: 1. Hospitalization for neutropenic fever after cycle 1 AC: Blood cultures were all been negative as well as chest x-ray. She was given antibiotics and discharged home. 2. Fatigue: Reduced the dosage of chemotherapy for cycle 2 and further with cycle 3. Will reduce again with 4th cycle. 3. Thrombocytopenia: Platelets 61 10/02/2014: Held chemotherapy, dose reduction with cycle 3. Platelets 53 10/24/2014: held chemotherapy, dose reduction with cycle 4. Monitoring platelets closely 4. Fevers Fevers have been persistent throughout chemotherapy. Not infectious in etiology. 5. Anemia due to chemotherapy: symptomatic anemia with hgb 8.2. Improvement to 11.3 after 2 units of blood on 10/23/14, hemoglobin 10.1 on 12/11/2014 6. Neutropenia: Was previously given neupogen injections after chemo treatments with cycle 3. But she had intractable back spasms and stopped them. Once chemo dose was adjusted, neutropenia has resolved. 7.  Headaches: continue tylenol. Use massage or icy hot to shoulders and neck area 8. Shortness of breath: Unclear etiology. Her hemoglobin is improving but her breathing has gotten worse. It could all be related to fatigue.  9. Left posterior rib discomfort: Bone scan is normal on 12/20/2014 10. Mouth sore: Previously treated with nystatin with moderate improvement in some symptoms but she continues to have persistent soreness in the back of the throat. I reordered nystatin today. If this does not get better then we can consider giving her Magic mouthwash.  RTC 1 week

## 2015-01-01 ENCOUNTER — Ambulatory Visit: Payer: PRIVATE HEALTH INSURANCE

## 2015-01-01 ENCOUNTER — Ambulatory Visit (HOSPITAL_BASED_OUTPATIENT_CLINIC_OR_DEPARTMENT_OTHER): Payer: PRIVATE HEALTH INSURANCE | Admitting: Hematology and Oncology

## 2015-01-01 ENCOUNTER — Encounter: Payer: Self-pay | Admitting: *Deleted

## 2015-01-01 ENCOUNTER — Ambulatory Visit: Payer: PRIVATE HEALTH INSURANCE | Admitting: Hematology and Oncology

## 2015-01-01 ENCOUNTER — Encounter: Payer: Self-pay | Admitting: Hematology and Oncology

## 2015-01-01 ENCOUNTER — Other Ambulatory Visit: Payer: PRIVATE HEALTH INSURANCE

## 2015-01-01 ENCOUNTER — Ambulatory Visit (HOSPITAL_BASED_OUTPATIENT_CLINIC_OR_DEPARTMENT_OTHER): Payer: PRIVATE HEALTH INSURANCE

## 2015-01-01 VITALS — BP 134/75 | HR 90 | Temp 98.3°F | Resp 18 | Ht 65.0 in | Wt 133.7 lb

## 2015-01-01 DIAGNOSIS — R509 Fever, unspecified: Secondary | ICD-10-CM

## 2015-01-01 DIAGNOSIS — C50412 Malignant neoplasm of upper-outer quadrant of left female breast: Secondary | ICD-10-CM

## 2015-01-01 DIAGNOSIS — D6481 Anemia due to antineoplastic chemotherapy: Secondary | ICD-10-CM

## 2015-01-01 DIAGNOSIS — Z5111 Encounter for antineoplastic chemotherapy: Secondary | ICD-10-CM

## 2015-01-01 DIAGNOSIS — R530 Neoplastic (malignant) related fatigue: Secondary | ICD-10-CM

## 2015-01-01 LAB — COMPREHENSIVE METABOLIC PANEL (CC13)
ALT: 26 U/L (ref 0–55)
ANION GAP: 10 meq/L (ref 3–11)
AST: 40 U/L — ABNORMAL HIGH (ref 5–34)
Albumin: 3.5 g/dL (ref 3.5–5.0)
Alkaline Phosphatase: 103 U/L (ref 40–150)
BUN: 4.4 mg/dL — ABNORMAL LOW (ref 7.0–26.0)
CALCIUM: 9.5 mg/dL (ref 8.4–10.4)
CHLORIDE: 106 meq/L (ref 98–109)
CO2: 25 meq/L (ref 22–29)
Creatinine: 0.6 mg/dL (ref 0.6–1.1)
EGFR: >90 mL/min/{1.73_m2} (ref >90)
Glucose: 97 mg/dl (ref 70–140)
POTASSIUM: 3.6 meq/L (ref 3.5–5.1)
Sodium: 141 mEq/L (ref 136–145)
Total Bilirubin: 0.34 mg/dL (ref 0.20–1.20)
Total Protein: 6.8 g/dL (ref 6.4–8.3)

## 2015-01-01 LAB — CBC WITH DIFFERENTIAL/PLATELET
BASO%: 1 % (ref 0.0–2.0)
BASOS ABS: 0 10*3/uL (ref 0.0–0.1)
EOS%: 3.1 % (ref 0.0–7.0)
Eosinophils Absolute: 0.1 10*3/uL (ref 0.0–0.5)
HEMATOCRIT: 33.8 % — AB (ref 34.8–46.6)
HGB: 11.3 g/dL — ABNORMAL LOW (ref 11.6–15.9)
LYMPH#: 1 10*3/uL (ref 0.9–3.3)
LYMPH%: 32.2 % (ref 14.0–49.7)
MCH: 33.4 pg (ref 25.1–34.0)
MCHC: 33.3 g/dL (ref 31.5–36.0)
MCV: 100.3 fL (ref 79.5–101.0)
MONO#: 0.4 10*3/uL (ref 0.1–0.9)
MONO%: 13.5 % (ref 0.0–14.0)
NEUT#: 1.6 10*3/uL (ref 1.5–6.5)
NEUT%: 50.2 % (ref 38.4–76.8)
PLATELETS: 164 10*3/uL (ref 145–400)
RBC: 3.37 10*6/uL — AB (ref 3.70–5.45)
RDW: 19 % — ABNORMAL HIGH (ref 11.2–14.5)
WBC: 3.2 10*3/uL — ABNORMAL LOW (ref 3.9–10.3)

## 2015-01-01 MED ORDER — HEPARIN SOD (PORK) LOCK FLUSH 100 UNIT/ML IV SOLN
500.0000 [IU] | Freq: Once | INTRAVENOUS | Status: AC | PRN
  Administered 2015-01-01: 500 [IU]
  Filled 2015-01-01: qty 5

## 2015-01-01 MED ORDER — SODIUM CHLORIDE 0.9 % IJ SOLN
10.0000 mL | INTRAMUSCULAR | Status: DC | PRN
  Filled 2015-01-01: qty 10

## 2015-01-01 MED ORDER — FLUCONAZOLE 100 MG PO TABS: 100.0000 mg | ORAL_TABLET | Freq: Every day | ORAL | Status: DC

## 2015-01-01 MED ORDER — PACLITAXEL PROTEIN-BOUND CHEMO INJECTION 100 MG
65.0000 mg/m2 | Freq: Once | INTRAVENOUS | Status: AC
  Administered 2015-01-01: 100 mg via INTRAVENOUS
  Filled 2015-01-01: qty 20

## 2015-01-01 MED ORDER — SODIUM CHLORIDE 0.9 % IV SOLN
Freq: Once | INTRAVENOUS | Status: AC
  Administered 2015-01-01: 09:00:00 via INTRAVENOUS

## 2015-01-01 MED ORDER — PALONOSETRON HCL INJECTION 0.25 MG/5ML
INTRAVENOUS | Status: AC
  Filled 2015-01-01: qty 5

## 2015-01-01 MED ORDER — PALONOSETRON HCL INJECTION 0.25 MG/5ML
0.2500 mg | Freq: Once | INTRAVENOUS | Status: AC
  Administered 2015-01-01: 0.25 mg via INTRAVENOUS

## 2015-01-01 NOTE — Patient Instructions (Signed)

## 2015-01-01 NOTE — Progress Notes (Signed)
Patient Care Team: Chipper Herb, MD as PCP - General (Family Medicine)  DIAGNOSIS: No matching staging information was found for the patient.  SUMMARY OF ONCOLOGIC HISTORY:   Breast cancer of upper-outer quadrant of left female breast ([place])   07/18/2014 Mammogram Distortion left breast, breast density category C; U/S 1.3 x 0.8 x 0.8 cm left breast mass at 2:00 position 4 cm from nipple, no lymph nodes   07/20/2014 Initial Diagnosis Left breast Biopsy: Invasive lobular cancer with LCIS, Grade 1, ER 86%, PR 78%, Her 2 Neg Ratio 1.77, Ki 67: 14%   08/01/2014 Breast MRI Breast MRI showed non-mass enhancement 3.9 cm, no lymph nodes   08/08/2014 Surgery Left breast lumpectomy: 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, with LCIS, medial and inferior margins positive, 3/4 lymph nodes positive T2 N1 cM0 stage IIB   08/16/2014 Surgery Left breast medial margin reexcision residual ILC 0.2 cm; inferior margin residual foci less than 0.2 cm, 1/5 lymph nodes positive, 1 lymph node with isolated tumor cells (Overall 4/10)   09/04/2014 -  Chemotherapy Adjuvant chemotherapy with dose dense Adriamycin and Cytoxan 4 followed by Abraxane weekly 12    CHIEF COMPLIANT: Cycle 8 Abraxane  INTERVAL HISTORY: [name] is a 54 year old with above-mentioned history of left breast cancer currently on adjuvant chemotherapy. Today is cycle 8 of Abraxane. Apart from intermittent fevers, she has been tolerating it fairly well. Does not have any nausea vomiting issues. Moderate degree of fatigue. Moderate shortness of breath on minimal exertion. Denies any neuropathy. She has sores on the side of the tongue that are extremely painful. The mouth sore in the roof of the mouth has resolved.  REVIEW OF SYSTEMS:   Constitutional: Denies fevers, chills or abnormal weight loss, fatigue present Eyes: Denies blurriness of vision Ears, nose, mouth, throat, and face: Denies mucositis or sore throat Respiratory: Shortness of breath  exertion Cardiovascular: Denies palpitation, chest discomfort or lower extremity swelling Gastrointestinal:  Denies nausea, heartburn or change in bowel habits Skin: Denies abnormal skin rashes Lymphatics: Denies new lymphadenopathy or easy bruising Neurological:Denies numbness, tingling or new weaknesses Behavioral/Psych: Mood is stable, no new changes   All other systems were reviewed with the patient and are negative.  I have reviewed the past medical history, past surgical history, social history and family history with the patient and they are unchanged from previous note.  ALLERGIES:  is allergic to crestor; lipitor; and temazepam.  MEDICATIONS:  Current Outpatient Prescriptions  Medication Sig Dispense Refill  . acetaminophen (TYLENOL) 325 MG tablet Take 650 mg by mouth every 6 (six) hours as needed for moderate pain.    . clonazePAM (KLONOPIN) 0.5 MG tablet TAKE 1 TABLET THREE TIMES A DAY AS NEEDED 90 tablet 0  . dexamethasone (DECADRON) 4 MG tablet TAKE 1 TAB ONCE A DAY ON THE DAY AFTER CHEMO. THEN TAKE 1 TAB TWICE DAILY FOR 2DAYS. TAKE WITH FOOD  1  . fluconazole (DIFLUCAN) 100 MG tablet Take 1 tablet (100 mg total) by mouth daily. 7 tablet 1  . hydroxypropyl methylcellulose / hypromellose (ISOPTO TEARS / GONIOVISC) 2.5 % ophthalmic solution Place 1 drop into both eyes 3 (three) times daily as needed for dry eyes.    Marland Kitchen lidocaine (XYLOCAINE) 2 % solution     . lidocaine-prilocaine (EMLA) cream APPLY TO AFFECTED AREA ONCE  3  . LORazepam (ATIVAN) 0.5 MG tablet Take 1 tablet (0.5 mg total) by mouth at bedtime. 30 tablet 0  . magic mouthwash w/lidocaine  SOLN Take 5 mLs by mouth 4 (four) times daily as needed for mouth pain. 120 mL 1  . ondansetron (ZOFRAN) 8 MG tablet Take 1 tablet (8 mg total) by mouth 2 (two) times daily. Start the day after chemo for 2 days. Then take as needed for nausea or vomiting. 30 tablet 1  . PARoxetine (PAXIL-CR) 25 MG 24 hr tablet TAKE 1 TABLET BY MOUTH  EVERY DAY 90 tablet 0  . prochlorperazine (COMPAZINE) 10 MG tablet Take 1 tablet (10 mg total) by mouth every 6 (six) hours as needed (Nausea or vomiting). 30 tablet 1   No current facility-administered medications for this visit.    PHYSICAL EXAMINATION: ECOG PERFORMANCE STATUS: 1 - Symptomatic but completely ambulatory  Filed Vitals:   01/01/15 0821  BP: 134/75  Pulse: 90  Temp: 98.3 F (36.8 C)  Resp: 18   Filed Weights   01/01/15 0821  Weight: 133 lb 11.2 oz (60.646 kg)    GENERAL:alert, no distress and comfortable SKIN: skin color, texture, turgor are normal, no rashes or significant lesions EYES: normal, Conjunctiva are pink and non-injected, sclera clear OROPHARYNX:no exudate, no erythema and lips, buccal mucosa, and tongue normal  NECK: supple, thyroid normal size, non-tender, without nodularity LYMPH:  no palpable lymphadenopathy in the cervical, axillary or inguinal LUNGS: clear to auscultation and percussion with normal breathing effort HEART: regular rate & rhythm and no murmurs and no lower extremity edema ABDOMEN:abdomen soft, non-tender and normal bowel sounds Musculoskeletal:no cyanosis of digits and no clubbing  NEURO: alert & oriented x 3 with fluent speech, no focal motor/sensory deficits  LABORATORY DATA:  I have reviewed the data as listed   Chemistry      Component Value Date/Time   NA 139 12/25/2014 0927   NA 132* 09/09/2014 2336   NA 141 07/21/2013 1023   K 3.6 12/25/2014 0927   K 3.6 09/09/2014 2336   CL 99* 09/09/2014 2336   CO2 22 12/25/2014 0927   CO2 25 09/09/2014 2336   BUN 7.4 12/25/2014 0927   BUN 15 09/09/2014 2336   BUN 14 07/21/2013 1023   CREATININE 0.6 12/25/2014 0927   CREATININE 0.43* 09/09/2014 2336   CREATININE 0.54 10/04/2012 1123      Component Value Date/Time   CALCIUM 9.2 12/25/2014 0927   CALCIUM 8.2* 09/09/2014 2336   ALKPHOS 91 12/25/2014 0927   ALKPHOS 70 09/09/2014 2336   AST 29 12/25/2014 0927   AST 19  09/09/2014 2336   ALT 18 12/25/2014 0927   ALT 26 09/09/2014 2336   BILITOT 0.50 12/25/2014 0927   BILITOT 0.8 09/09/2014 2336       Lab Results  Component Value Date   WBC 5.9 12/25/2014   HGB 11.2* 12/25/2014   HCT 33.7* 12/25/2014   MCV 99.4 12/25/2014   PLT 155 12/25/2014   NEUTROABS 3.5 12/25/2014   ASSESSMENT & PLAN:  Breast cancer of upper-outer quadrant of left female breast Left breast lumpectomy 08/08/2014: 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 Adjuvant chemotherapy with dose dense Adriamycin and Cytoxan x 4 followed by Abraxane weekly 12 because of lymph node positive disease.  2. Followed by adjuvant radiation 3. Followed by antiestrogen therapy ----------------------------------------------------------------------------------------------------------------------------------- Current Treatment: completed 4 cycles of dose dense Adriamycin and Cytoxan (Prev held chemotherapy for thrombocytopenia, dose reduction for neutropenia), today is cycle 8 Abraxane weekly 12  Chemotoxicities: 1. Hospitalization for neutropenic fever after cycle 1 AC: Blood cultures were all been negative as well as chest x-ray. She was given antibiotics and discharged home. 2. Fatigue: Reduced the dosage of chemotherapy for cycle 2 and further with cycle 3. Will reduce again with 4th cycle. 3. Thrombocytopenia: Platelets 61 10/02/2014: Held chemotherapy, dose reduction with cycle 3. Platelets 53 10/24/2014: held chemotherapy, dose reduction with cycle 4. Monitoring platelets closely 4. Fevers Fevers have been persistent throughout  chemotherapy. Not infectious in etiology. 5. Anemia due to chemotherapy: symptomatic anemia with hgb 8.2. Improvement to 11.3 after 2 units of blood on 10/23/14, hemoglobin 10.1 on 12/11/2014 6. Neutropenia: Was previously given neupogen injections after chemo treatments with cycle 3. But she had intractable back spasms and stopped them. Once chemo dose was adjusted, neutropenia has resolved. 7. Headaches: continue tylenol. Use massage or icy hot to shoulders and neck area 8. Shortness of breath: Unclear etiology. Her hemoglobin is improving but her breathing has gotten worse. It could all be related to fatigue.  9. Left posterior rib discomfort: Bone scan is normal on 12/20/2014 10. Mouth sore: Previously treated with nystatin and currently using Magic mouthwash. I prescribed her Diflucan since she developed several new lesions along the side of the tongue which are causing her intractable pain.  RTC 1 week    No orders of the defined types were placed in this encounter.   The patient has a good understanding of the overall plan. she agrees with it. she will call with any problems that may develop before the next visit here.   Rulon Eisenmenger, MD 01/01/2015

## 2015-01-07 NOTE — Assessment & Plan Note (Signed)
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 Adjuvant chemotherapy with dose dense Adriamycin and Cytoxan x 4 followed by Abraxane weekly 12 because of lymph node positive disease. 2. Followed by adjuvant radiation 3. Followed by antiestrogen therapy ----------------------------------------------------------------------------------------------------------------------------------- Current Treatment: completed 4 cycles of dose dense Adriamycin and Cytoxan (Prev held chemotherapy for thrombocytopenia, dose reduction for neutropenia), today is cycle 9 Abraxane weekly 12  Chemotoxicities: 1. Hospitalization for neutropenic fever after cycle 1 AC: Blood cultures were all been negative as well as chest x-ray. She was given antibiotics and discharged home. 2. Fatigue: Reduced the dosage of chemotherapy for cycle 2 and further with cycle 3. Will reduce again with 4th cycle. 3. Thrombocytopenia: Platelets 61 10/02/2014: Held chemotherapy, dose reduction with cycle 3. Platelets 53 10/24/2014: held chemotherapy, dose reduction with cycle 4. Monitoring platelets closely 4. Fevers Fevers have been persistent throughout chemotherapy. Not infectious in etiology. 5. Anemia due to chemotherapy: symptomatic anemia with hgb 8.2. Improvement to 11.3 after 2 units of blood on 10/23/14, hemoglobin 10.1 on 12/11/2014 6. Neutropenia: Was previously given neupogen injections after chemo treatments with cycle 3. But she had intractable back spasms and stopped them. Once chemo dose was adjusted, neutropenia has resolved. 7.  Headaches: continue tylenol. Use massage or icy hot to shoulders and neck area 8. Shortness of breath: Unclear etiology. Her hemoglobin is improving but her breathing has gotten worse. It could all be related to fatigue.  9. Left posterior rib discomfort: Bone scan is normal on 12/20/2014 10. Mouth sore: Previously treated with nystatin and currently using Magic mouthwash. I prescribed her Diflucan since she developed several new lesions along the side of the tongue which are causing her intractable pain.  RTC 1 week

## 2015-01-08 ENCOUNTER — Encounter: Payer: Self-pay | Admitting: Hematology and Oncology

## 2015-01-08 ENCOUNTER — Ambulatory Visit: Payer: PRIVATE HEALTH INSURANCE

## 2015-01-08 ENCOUNTER — Other Ambulatory Visit (HOSPITAL_BASED_OUTPATIENT_CLINIC_OR_DEPARTMENT_OTHER): Payer: PRIVATE HEALTH INSURANCE

## 2015-01-08 ENCOUNTER — Telehealth: Payer: Self-pay | Admitting: Hematology and Oncology

## 2015-01-08 ENCOUNTER — Ambulatory Visit (HOSPITAL_BASED_OUTPATIENT_CLINIC_OR_DEPARTMENT_OTHER): Payer: PRIVATE HEALTH INSURANCE | Admitting: Hematology and Oncology

## 2015-01-08 VITALS — BP 137/79 | HR 96 | Temp 98.1°F | Resp 18 | Ht 65.0 in | Wt 135.0 lb

## 2015-01-08 DIAGNOSIS — K1370 Unspecified lesions of oral mucosa: Secondary | ICD-10-CM

## 2015-01-08 DIAGNOSIS — D701 Agranulocytosis secondary to cancer chemotherapy: Secondary | ICD-10-CM | POA: Diagnosis not present

## 2015-01-08 DIAGNOSIS — R51 Headache: Secondary | ICD-10-CM

## 2015-01-08 DIAGNOSIS — R530 Neoplastic (malignant) related fatigue: Secondary | ICD-10-CM

## 2015-01-08 DIAGNOSIS — C50412 Malignant neoplasm of upper-outer quadrant of left female breast: Secondary | ICD-10-CM | POA: Diagnosis not present

## 2015-01-08 DIAGNOSIS — R5081 Fever presenting with conditions classified elsewhere: Secondary | ICD-10-CM

## 2015-01-08 DIAGNOSIS — D6481 Anemia due to antineoplastic chemotherapy: Secondary | ICD-10-CM

## 2015-01-08 LAB — CBC WITH DIFFERENTIAL/PLATELET
BASO%: 0.7 % (ref 0.0–2.0)
Basophils Absolute: 0 10*3/uL (ref 0.0–0.1)
EOS%: 2.1 % (ref 0.0–7.0)
Eosinophils Absolute: 0.1 10*3/uL (ref 0.0–0.5)
HCT: 34 % — ABNORMAL LOW (ref 34.8–46.6)
HGB: 10.9 g/dL — ABNORMAL LOW (ref 11.6–15.9)
LYMPH#: 1.6 10*3/uL (ref 0.9–3.3)
LYMPH%: 55.5 % — AB (ref 14.0–49.7)
MCH: 32.8 pg (ref 25.1–34.0)
MCHC: 32.1 g/dL (ref 31.5–36.0)
MCV: 102.4 fL — ABNORMAL HIGH (ref 79.5–101.0)
MONO#: 0.4 10*3/uL (ref 0.1–0.9)
MONO%: 13.5 % (ref 0.0–14.0)
NEUT%: 28.2 % — ABNORMAL LOW (ref 38.4–76.8)
NEUTROS ABS: 0.8 10*3/uL — AB (ref 1.5–6.5)
PLATELETS: 144 10*3/uL — AB (ref 145–400)
RBC: 3.32 10*6/uL — AB (ref 3.70–5.45)
RDW: 16.4 % — ABNORMAL HIGH (ref 11.2–14.5)
WBC: 2.8 10*3/uL — ABNORMAL LOW (ref 3.9–10.3)

## 2015-01-08 LAB — COMPREHENSIVE METABOLIC PANEL (CC13)
ALBUMIN: 3.6 g/dL (ref 3.5–5.0)
ALK PHOS: 92 U/L (ref 40–150)
ALT: 31 U/L (ref 0–55)
AST: 41 U/L — AB (ref 5–34)
Anion Gap: 7 mEq/L (ref 3–11)
BUN: 7.1 mg/dL (ref 7.0–26.0)
CALCIUM: 9.4 mg/dL (ref 8.4–10.4)
CO2: 25 mEq/L (ref 22–29)
CREATININE: 0.6 mg/dL (ref 0.6–1.1)
Chloride: 108 mEq/L (ref 98–109)
EGFR: >90 mL/min/{1.73_m2} (ref >90)
GLUCOSE: 104 mg/dL (ref 70–140)
Potassium: 4.1 mEq/L (ref 3.5–5.1)
SODIUM: 141 meq/L (ref 136–145)
Total Bilirubin: <0.3 mg/dL (ref 0.20–1.20)
Total Protein: 6.8 g/dL (ref 6.4–8.3)

## 2015-01-08 NOTE — Progress Notes (Signed)
Patient Care Team: Chipper Herb, MD as PCP - General (Family Medicine)  DIAGNOSIS: No matching staging information was found for the patient.  SUMMARY OF ONCOLOGIC HISTORY:   Breast cancer of upper-outer quadrant of left female breast ([place])   07/18/2014 Mammogram Distortion left breast, breast density category C; U/S 1.3 x 0.8 x 0.8 cm left breast mass at 2:00 position 4 cm from nipple, no lymph nodes   07/20/2014 Initial Diagnosis Left breast Biopsy: Invasive lobular cancer with LCIS, Grade 1, ER 86%, PR 78%, Her 2 Neg Ratio 1.77, Ki 67: 14%   08/01/2014 Breast MRI Breast MRI showed non-mass enhancement 3.9 cm, no lymph nodes   08/08/2014 Surgery Left breast lumpectomy: 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, with LCIS, medial and inferior margins positive, 3/4 lymph nodes positive T2 N1 cM0 stage IIB   08/16/2014 Surgery Left breast medial margin reexcision residual ILC 0.2 cm; inferior margin residual foci less than 0.2 cm, 1/5 lymph nodes positive, 1 lymph node with isolated tumor cells (Overall 4/10)   09/04/2014 -  Chemotherapy Adjuvant chemotherapy with dose dense Adriamycin and Cytoxan 4 followed by Abraxane weekly 12    CHIEF COMPLIANT: Cycle 9 Abraxane (being held for neutropenia)  INTERVAL HISTORY: [name] is a 54 year old with above-mentioned history of left breast cancer currently on adjuvant chemotherapy with Abraxane. She has had mouth sores over the past 2 or 3 weeks. We have tried different medications including nystatin, Magic mouthwash. I started her on Diflucan last week. Fevers finally went away this week. Today her Newell is 800. Unfortunately this means she cannot receive her chemotherapy. It is strange but it appears that since she stopped drinking V8 tomato juice, her white blood cell counts have come down.  REVIEW OF SYSTEMS:   Constitutional: Denies fevers, chills or abnormal weight loss Eyes: Denies blurriness of vision Ears, nose, mouth, throat, and  face: Mouth sore along the right side of the tongue Respiratory: Denies cough, dyspnea or wheezes Cardiovascular: Denies palpitation, chest discomfort or lower extremity swelling Gastrointestinal:  Denies nausea, heartburn or change in bowel habits Skin: Denies abnormal skin rashes Lymphatics: Denies new lymphadenopathy or easy bruising Neurological:Denies numbness, tingling or new weaknesses Behavioral/Psych: Mood is stable, no new changes   All other systems were reviewed with the patient and are negative.  I have reviewed the past medical history, past surgical history, social history and family history with the patient and they are unchanged from previous note.  ALLERGIES:  is allergic to crestor; lipitor; and temazepam.  MEDICATIONS:  Current Outpatient Prescriptions  Medication Sig Dispense Refill  . acetaminophen (TYLENOL) 325 MG tablet Take 650 mg by mouth every 6 (six) hours as needed for moderate pain.    . clonazePAM (KLONOPIN) 0.5 MG tablet TAKE 1 TABLET THREE TIMES A DAY AS NEEDED 90 tablet 0  . dexamethasone (DECADRON) 4 MG tablet TAKE 1 TAB ONCE A DAY ON THE DAY AFTER CHEMO. THEN TAKE 1 TAB TWICE DAILY FOR 2DAYS. TAKE WITH FOOD  1  . fluconazole (DIFLUCAN) 100 MG tablet Take 1 tablet (100 mg total) by mouth daily. 7 tablet 1  . hydroxypropyl methylcellulose / hypromellose (ISOPTO TEARS / GONIOVISC) 2.5 % ophthalmic solution Place 1 drop into both eyes 3 (three) times daily as needed for dry eyes.    Marland Kitchen lidocaine (XYLOCAINE) 2 % solution     . lidocaine-prilocaine (EMLA) cream APPLY TO AFFECTED AREA ONCE  3  . LORazepam (ATIVAN) 0.5 MG tablet Take  1 tablet (0.5 mg total) by mouth at bedtime. 30 tablet 0  . magic mouthwash w/lidocaine SOLN Take 5 mLs by mouth 4 (four) times daily as needed for mouth pain. 120 mL 1  . ondansetron (ZOFRAN) 8 MG tablet Take 1 tablet (8 mg total) by mouth 2 (two) times daily. Start the day after chemo for 2 days. Then take as needed for nausea or  vomiting. 30 tablet 1  . PARoxetine (PAXIL-CR) 25 MG 24 hr tablet TAKE 1 TABLET BY MOUTH EVERY DAY 90 tablet 0  . prochlorperazine (COMPAZINE) 10 MG tablet Take 1 tablet (10 mg total) by mouth every 6 (six) hours as needed (Nausea or vomiting). 30 tablet 1   No current facility-administered medications for this visit.    PHYSICAL EXAMINATION: ECOG PERFORMANCE STATUS: 1 - Symptomatic but completely ambulatory  Filed Vitals:   01/08/15 0828  BP: 137/79  Pulse: 96  Temp: 98.1 F (36.7 C)  Resp: 18   Filed Weights   01/08/15 0828  Weight: 135 lb (61.236 kg)    GENERAL:alert, no distress and comfortable SKIN: skin color, texture, turgor are normal, no rashes or significant lesions EYES: normal, Conjunctiva are pink and non-injected, sclera clear OROPHARYNX:no exudate, no erythema and lips, buccal mucosa, and tongue normal  NECK: supple, thyroid normal size, non-tender, without nodularity LYMPH:  no palpable lymphadenopathy in the cervical, axillary or inguinal LUNGS: clear to auscultation and percussion with normal breathing effort HEART: regular rate & rhythm and no murmurs and no lower extremity edema ABDOMEN:abdomen soft, non-tender and normal bowel sounds Musculoskeletal:no cyanosis of digits and no clubbing  NEURO: alert & oriented x 3 with fluent speech, no focal motor/sensory deficits  LABORATORY DATA:  I have reviewed the data as listed   Chemistry      Component Value Date/Time   NA 141 01/08/2015 0805   NA 132* 09/09/2014 2336   NA 141 07/21/2013 1023   K 4.1 01/08/2015 0805   K 3.6 09/09/2014 2336   CL 99* 09/09/2014 2336   CO2 25 01/08/2015 0805   CO2 25 09/09/2014 2336   BUN 7.1 01/08/2015 0805   BUN 15 09/09/2014 2336   BUN 14 07/21/2013 1023   CREATININE 0.6 01/08/2015 0805   CREATININE 0.43* 09/09/2014 2336   CREATININE 0.54 10/04/2012 1123      Component Value Date/Time   CALCIUM 9.4 01/08/2015 0805   CALCIUM 8.2* 09/09/2014 2336   ALKPHOS 92  01/08/2015 0805   ALKPHOS 70 09/09/2014 2336   AST 41* 01/08/2015 0805   AST 19 09/09/2014 2336   ALT 31 01/08/2015 0805   ALT 26 09/09/2014 2336   BILITOT <0.30 01/08/2015 0805   BILITOT 0.8 09/09/2014 2336       Lab Results  Component Value Date   WBC 2.8* 01/08/2015   HGB 10.9* 01/08/2015   HCT 34.0* 01/08/2015   MCV 102.4* 01/08/2015   PLT 144* 01/08/2015   NEUTROABS 0.8* 01/08/2015   ASSESSMENT & PLAN:  Breast cancer of upper-outer quadrant of left female breast Left breast lumpectomy 08/08/2014: 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 Adjuvant chemotherapy with dose dense Adriamycin  and Cytoxan x 4 followed by Abraxane weekly 12 because of lymph node positive disease. 2. Followed by adjuvant radiation 3. Followed by antiestrogen therapy ----------------------------------------------------------------------------------------------------------------------------------- Current Treatment: completed 4 cycles of dose dense Adriamycin and Cytoxan (Prev held chemotherapy for thrombocytopenia, dose reduction for neutropenia), today is cycle 9 Abraxane weekly 12  Chemotoxicities: 1. Hospitalization for neutropenic fever after cycle 1 AC: Blood cultures were all been negative as well as chest x-ray. She was given antibiotics and discharged home. 2. Fatigue: Reduced the dosage of chemotherapy for cycle 2 and further with cycle 3. Will reduce again with 4th cycle. 3. Thrombocytopenia: Platelets 61 10/02/2014: Held chemotherapy, dose reduction with cycle 3. Platelets 53 10/24/2014: held chemotherapy, dose reduction with  cycle 4. Monitoring platelets closely 4. Fevers Fevers have been persistent throughout chemotherapy. Not infectious in etiology. 5. Anemia due to chemotherapy: symptomatic anemia with hgb 8.2. Improvement to 11.3 after 2 units of blood on 10/23/14, hemoglobin 10.1 on 12/11/2014 6. Neutropenia: ANC 0.8 on 01/08/2015. We will have to hold chemotherapy. 7. Headaches: continue tylenol. Use massage or icy hot to shoulders and neck area 8. Shortness of breath: Unclear etiology. Her hemoglobin is improving but her breathing has gotten worse. It could all be related to fatigue.  9. Left posterior rib discomfort: Bone scan is normal on 12/20/2014 10. Mouth sore: Previously treated with nystatin and currently using Magic mouthwash. Slight improvement with Diflucan encourage her to take 1 more week.  RTC 1 week for cycle 9   No orders of the defined types were placed in this encounter.   The patient has a good understanding of the overall plan. she agrees with it. she will call with any problems that may develop before the next visit here.   Rulon Eisenmenger, MD 01/08/2015

## 2015-01-08 NOTE — Telephone Encounter (Signed)
Appointments made and avs printed for patient °

## 2015-01-08 NOTE — Addendum Note (Signed)
Addended by: Prentiss Bells on: 01/08/2015 10:58 AM   Modules accepted: Medications

## 2015-01-14 NOTE — Assessment & Plan Note (Addendum)
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 Adjuvant chemotherapy with dose dense Adriamycin and Cytoxan x 4 followed by Abraxane weekly 12 because of lymph node positive disease. 2. Followed by adjuvant radiation 3. Followed by antiestrogen therapy ----------------------------------------------------------------------------------------------------------------------------------- Current Treatment: completed 4 cycles of dose dense Adriamycin and Cytoxan (Prev held chemotherapy for thrombocytopenia, dose reduction for neutropenia), today is cycle 9 Abraxane weekly 12  Chemotoxicities: 1. Hospitalization for neutropenic fever after cycle 1 AC: Blood cultures were all been negative as well as chest x-ray. She was given antibiotics and discharged home. 2. Fatigue: Reduced the dosage of chemotherapy for cycle 2 and further with cycle 3. Will reduce again with 4th cycle. 3. Thrombocytopenia: Platelets 61 10/02/2014: Held chemotherapy, dose reduction with cycle 3. Platelets 53 10/24/2014: held chemotherapy, dose reduction with cycle 4. Monitoring platelets closely 4. Fevers Fevers have been persistent throughout chemotherapy. Not infectious in etiology. 5. Anemia due to chemotherapy: symptomatic anemia with hgb 8.2. Improvement to 11.3 after 2 units of blood on 10/23/14, hemoglobin 10.1 on 12/11/2014 6. Neutropenia: ANC 0.8 on 01/08/2015. We will have to hold chemotherapy. 7. Headaches: continue tylenol. Use massage or icy hot to shoulders and neck area 8. Shortness of breath: Unclear etiology. Her hemoglobin  is improving but her breathing has gotten worse. It could all be related to fatigue.  9. Left posterior rib discomfort: Bone scan is normal on 12/20/2014 10. Mouth sore: Previously treated with nystatin and currently using Magic mouthwash. Slight improvement with Diflucan encourage her to take 1 more week.  RTC 1 week for cycle 10

## 2015-01-15 ENCOUNTER — Ambulatory Visit (HOSPITAL_BASED_OUTPATIENT_CLINIC_OR_DEPARTMENT_OTHER): Payer: PRIVATE HEALTH INSURANCE | Admitting: Hematology and Oncology

## 2015-01-15 ENCOUNTER — Other Ambulatory Visit (HOSPITAL_BASED_OUTPATIENT_CLINIC_OR_DEPARTMENT_OTHER): Payer: PRIVATE HEALTH INSURANCE

## 2015-01-15 ENCOUNTER — Encounter: Payer: Self-pay | Admitting: *Deleted

## 2015-01-15 ENCOUNTER — Other Ambulatory Visit: Payer: Self-pay | Admitting: *Deleted

## 2015-01-15 ENCOUNTER — Ambulatory Visit: Payer: PRIVATE HEALTH INSURANCE

## 2015-01-15 ENCOUNTER — Encounter: Payer: Self-pay | Admitting: Hematology and Oncology

## 2015-01-15 VITALS — BP 128/83 | HR 91 | Temp 98.3°F | Resp 18 | Ht 65.0 in | Wt 135.3 lb

## 2015-01-15 DIAGNOSIS — D696 Thrombocytopenia, unspecified: Secondary | ICD-10-CM | POA: Diagnosis not present

## 2015-01-15 DIAGNOSIS — C50412 Malignant neoplasm of upper-outer quadrant of left female breast: Secondary | ICD-10-CM

## 2015-01-15 DIAGNOSIS — R530 Neoplastic (malignant) related fatigue: Secondary | ICD-10-CM | POA: Diagnosis not present

## 2015-01-15 DIAGNOSIS — D701 Agranulocytosis secondary to cancer chemotherapy: Secondary | ICD-10-CM

## 2015-01-15 DIAGNOSIS — D6481 Anemia due to antineoplastic chemotherapy: Secondary | ICD-10-CM

## 2015-01-15 LAB — COMPREHENSIVE METABOLIC PANEL (CC13)
ALBUMIN: 3.6 g/dL (ref 3.5–5.0)
ALK PHOS: 79 U/L (ref 40–150)
ALT: 33 U/L (ref 0–55)
ANION GAP: 9 meq/L (ref 3–11)
AST: 36 U/L — AB (ref 5–34)
BUN: 8.6 mg/dL (ref 7.0–26.0)
CALCIUM: 9.7 mg/dL (ref 8.4–10.4)
CHLORIDE: 107 meq/L (ref 98–109)
CO2: 24 mEq/L (ref 22–29)
CREATININE: 0.6 mg/dL (ref 0.6–1.1)
EGFR: >90 mL/min/{1.73_m2} (ref >90)
Glucose: 99 mg/dl (ref 70–140)
POTASSIUM: 3.9 meq/L (ref 3.5–5.1)
Sodium: 140 mEq/L (ref 136–145)
Total Bilirubin: 0.49 mg/dL (ref 0.20–1.20)
Total Protein: 6.8 g/dL (ref 6.4–8.3)

## 2015-01-15 LAB — CBC WITH DIFFERENTIAL/PLATELET
BASO%: 0.3 % (ref 0.0–2.0)
BASOS ABS: 0 10*3/uL (ref 0.0–0.1)
EOS ABS: 0 10*3/uL (ref 0.0–0.5)
EOS%: 1.2 % (ref 0.0–7.0)
HEMATOCRIT: 36.1 % (ref 34.8–46.6)
HEMOGLOBIN: 12 g/dL (ref 11.6–15.9)
LYMPH#: 1.6 10*3/uL (ref 0.9–3.3)
LYMPH%: 53.2 % — ABNORMAL HIGH (ref 14.0–49.7)
MCH: 33.4 pg (ref 25.1–34.0)
MCHC: 33.3 g/dL (ref 31.5–36.0)
MCV: 100.3 fL (ref 79.5–101.0)
MONO#: 0.5 10*3/uL (ref 0.1–0.9)
MONO%: 17.3 % — ABNORMAL HIGH (ref 0.0–14.0)
NEUT#: 0.9 10*3/uL — ABNORMAL LOW (ref 1.5–6.5)
NEUT%: 28 % — ABNORMAL LOW (ref 38.4–76.8)
PLATELETS: 143 10*3/uL — AB (ref 145–400)
RBC: 3.6 10*6/uL — ABNORMAL LOW (ref 3.70–5.45)
RDW: 17.5 % — AB (ref 11.2–14.5)
WBC: 3.1 10*3/uL — ABNORMAL LOW (ref 3.9–10.3)

## 2015-01-15 NOTE — Progress Notes (Signed)
Patient Care Team: Chipper Herb, MD as PCP - General (Family Medicine)  DIAGNOSIS: No matching staging information was found for the patient.  SUMMARY OF ONCOLOGIC HISTORY:   Breast cancer of upper-outer quadrant of left female breast ([place])   07/18/2014 Mammogram Distortion left breast, breast density category C; U/S 1.3 x 0.8 x 0.8 cm left breast mass at 2:00 position 4 cm from nipple, no lymph nodes   07/20/2014 Initial Diagnosis Left breast Biopsy: Invasive lobular cancer with LCIS, Grade 1, ER 86%, PR 78%, Her 2 Neg Ratio 1.77, Ki 67: 14%   08/01/2014 Breast MRI Breast MRI showed non-mass enhancement 3.9 cm, no lymph nodes   08/08/2014 Surgery Left breast lumpectomy: 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, with LCIS, medial and inferior margins positive, 3/4 lymph nodes positive T2 N1 cM0 stage IIB   08/16/2014 Surgery Left breast medial margin reexcision residual ILC 0.2 cm; inferior margin residual foci less than 0.2 cm, 1/5 lymph nodes positive, 1 lymph node with isolated tumor cells (Overall 4/10)   09/04/2014 - 01/15/2015 Chemotherapy Adjuvant chemotherapy with dose dense Adriamycin and Cytoxan 4 followed by Abraxane weekly 8 ( stopped early for profound neutropenia and thrombocytopenia)    CHIEF COMPLIANT: mouth sores have resolved  INTERVAL HISTORY: [name] is a 54 year old with above-mentioned history of left breast cancer treated with lumpectomy followed by adjuvant chemotherapy. She is here today to receive Abraxane treatment. Lastly chemotherapy was held because of neutropenia and thrombocytopenia. Today her blood counts have not recovered yet. She denies any nausea or vomiting. She reports of the mouth sores have resolved.  REVIEW OF SYSTEMS:   Constitutional: Denies fevers, chills or abnormal weight loss Eyes: Denies blurriness of vision Ears, nose, mouth, throat, and face: Denies mucositis or sore throat Respiratory: Denies cough, dyspnea or  wheezes Cardiovascular: Denies palpitation, chest discomfort or lower extremity swelling Gastrointestinal:  Denies nausea, heartburn or change in bowel habits Skin: Denies abnormal skin rashes Lymphatics: Denies new lymphadenopathy or easy bruising Neurological:Denies numbness, tingling or new weaknesses Behavioral/Psych: Mood is stable, no new changes  All other systems were reviewed with the patient and are negative.  I have reviewed the past medical history, past surgical history, social history and family history with the patient and they are unchanged from previous note.  ALLERGIES:  is allergic to crestor; lipitor; and temazepam.  MEDICATIONS:  Current Outpatient Prescriptions  Medication Sig Dispense Refill  . acetaminophen (TYLENOL) 325 MG tablet Take 650 mg by mouth every 6 (six) hours as needed for moderate pain.    . clonazePAM (KLONOPIN) 0.5 MG tablet TAKE 1 TABLET THREE TIMES A DAY AS NEEDED 90 tablet 0  . dexamethasone (DECADRON) 4 MG tablet TAKE 1 TAB ONCE A DAY ON THE DAY AFTER CHEMO. THEN TAKE 1 TAB TWICE DAILY FOR 2DAYS. TAKE WITH FOOD  1  . fluconazole (DIFLUCAN) 100 MG tablet Take 1 tablet (100 mg total) by mouth daily. 7 tablet 1  . hydroxypropyl methylcellulose / hypromellose (ISOPTO TEARS / GONIOVISC) 2.5 % ophthalmic solution Place 1 drop into both eyes 3 (three) times daily as needed for dry eyes.    Marland Kitchen lidocaine (XYLOCAINE) 2 % solution     . lidocaine-prilocaine (EMLA) cream APPLY TO AFFECTED AREA ONCE  3  . LORazepam (ATIVAN) 0.5 MG tablet Take 1 tablet (0.5 mg total) by mouth at bedtime. 30 tablet 0  . magic mouthwash w/lidocaine SOLN Take 5 mLs by mouth 4 (four) times daily as needed  for mouth pain. 120 mL 1  . ondansetron (ZOFRAN) 8 MG tablet Take 1 tablet (8 mg total) by mouth 2 (two) times daily. Start the day after chemo for 2 days. Then take as needed for nausea or vomiting. 30 tablet 1  . PARoxetine (PAXIL-CR) 25 MG 24 hr tablet TAKE 1 TABLET BY MOUTH  EVERY DAY 90 tablet 0  . prochlorperazine (COMPAZINE) 10 MG tablet Take 1 tablet (10 mg total) by mouth every 6 (six) hours as needed (Nausea or vomiting). 30 tablet 1   No current facility-administered medications for this visit.    PHYSICAL EXAMINATION: ECOG PERFORMANCE STATUS: 1 - Symptomatic but completely ambulatory  Filed Vitals:   01/15/15 0826  BP: 128/83  Pulse: 91  Temp: 98.3 F (36.8 C)  Resp: 18   Filed Weights   01/15/15 0826  Weight: 135 lb 4.8 oz (61.372 kg)    GENERAL:alert, no distress and comfortable SKIN: skin color, texture, turgor are normal, no rashes or significant lesions EYES: normal, Conjunctiva are pink and non-injected, sclera clear OROPHARYNX:no exudate, no erythema and lips, buccal mucosa, and tongue normal  NECK: supple, thyroid normal size, non-tender, without nodularity LYMPH:  no palpable lymphadenopathy in the cervical, axillary or inguinal LUNGS: clear to auscultation and percussion with normal breathing effort HEART: regular rate & rhythm and no murmurs and no lower extremity edema ABDOMEN:abdomen soft, non-tender and normal bowel sounds Musculoskeletal:no cyanosis of digits and no clubbing  NEURO: alert & oriented x 3 with fluent speech, no focal motor/sensory deficits LABORATORY DATA:  I have reviewed the data as listed   Chemistry      Component Value Date/Time   NA 140 01/15/2015 0747   NA 132* 09/09/2014 2336   NA 141 07/21/2013 1023   K 3.9 01/15/2015 0747   K 3.6 09/09/2014 2336   CL 99* 09/09/2014 2336   CO2 24 01/15/2015 0747   CO2 25 09/09/2014 2336   BUN 8.6 01/15/2015 0747   BUN 15 09/09/2014 2336   BUN 14 07/21/2013 1023   CREATININE 0.6 01/15/2015 0747   CREATININE 0.43* 09/09/2014 2336   CREATININE 0.54 10/04/2012 1123      Component Value Date/Time   CALCIUM 9.7 01/15/2015 0747   CALCIUM 8.2* 09/09/2014 2336   ALKPHOS 79 01/15/2015 0747   ALKPHOS 70 09/09/2014 2336   AST 36* 01/15/2015 0747   AST 19  09/09/2014 2336   ALT 33 01/15/2015 0747   ALT 26 09/09/2014 2336   BILITOT 0.49 01/15/2015 0747   BILITOT 0.8 09/09/2014 2336       Lab Results  Component Value Date   WBC 3.1* 01/15/2015   HGB 12.0 01/15/2015   HCT 36.1 01/15/2015   MCV 100.3 01/15/2015   PLT 143* 01/15/2015   NEUTROABS 0.9* 01/15/2015   ASSESSMENT & PLAN:  Breast cancer of upper-outer quadrant of left female breast Left breast lumpectomy 08/08/2014: 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 Adjuvant chemotherapy with dose dense Adriamycin and Cytoxan x 4 followed by Abraxane weekly 8 because of lymph node positive disease. (started 09/04/2014 completed 01/15/2015, stopped early due to severe neutropenia and thrombocytopenia) 2.  Followed by adjuvant radiation 3. Followed by antiestrogen therapy ----------------------------------------------------------------------------------------------------------------------------------- Current Treatment: completed 4 cycles of dose dense Adriamycin and Cytoxan (Prev held chemotherapy for thrombocytopenia, dose reduction for neutropenia), today supposed to be cycle 9 but chemotherapy was discontinued because of profound neutropenia and thrombocytopenia which did not improve in spite of treatment break.  Chemotoxicities: neutropenia, fatigue, thrombocytopenia, recurrent fevers, anemia, headache, shortness of breath, rib discomfort evaluated the bone scan which was normal, mouth sores  Treatment plan: 1. Proceed with adjuvant radiation therapy: We will request Dr. Lisbeth Renshaw to see her and get her line for adjuvant  radiation. She is hoping to finish before Christmas. 2. Followed by antiestrogen therapy. The choices are between tamoxifen versus anastrozole. Since he had ablation she has not had any menstrual cycles. Because of this we will obtain [facility] and estradiol levels to determine her menopausal state and then think about the optimal treatment for her.  I discussed the pros and cons of tamoxifen and anastrozole including the mechanism of action and side effects. Return to clinic after radiation therapy to start antiestrogen therapy.  No orders of the defined types were placed in this encounter.   The patient has a good understanding of the overall plan. she agrees with it. she will call with any problems that may develop before the next visit here.   Rulon Eisenmenger, MD 01/15/2015

## 2015-01-15 NOTE — Progress Notes (Signed)
Pt ended treatment. Took over to infusion to "ring the bell". Scheduled and confirmed appt with Dr. Iran Planas for oncoplasty consult.

## 2015-01-17 ENCOUNTER — Encounter: Payer: Self-pay | Admitting: Radiation Oncology

## 2015-01-17 ENCOUNTER — Ambulatory Visit
Admission: RE | Admit: 2015-01-17 | Discharge: 2015-01-17 | Disposition: A | Discharge status: Home / Self Care | Payer: PRIVATE HEALTH INSURANCE | Source: Ambulatory Visit | Attending: Radiation Oncology | Admitting: Radiation Oncology

## 2015-01-17 VITALS — BP 123/82 | HR 93 | Temp 98.1°F | Ht 65.0 in | Wt 136.2 lb

## 2015-01-17 DIAGNOSIS — Z510 Encounter for antineoplastic radiation therapy: Secondary | ICD-10-CM | POA: Insufficient documentation

## 2015-01-17 DIAGNOSIS — C50412 Malignant neoplasm of upper-outer quadrant of left female breast: Secondary | ICD-10-CM | POA: Insufficient documentation

## 2015-01-17 DIAGNOSIS — Z170 Estrogen receptor positive status [ER+]: Secondary | ICD-10-CM | POA: Insufficient documentation

## 2015-01-17 DIAGNOSIS — Z9221 Personal history of antineoplastic chemotherapy: Secondary | ICD-10-CM | POA: Insufficient documentation

## 2015-01-17 NOTE — Progress Notes (Signed)
Please see the Nurse Progress Note in the MD Initial Consult Encounter for this patient. 

## 2015-01-17 NOTE — Progress Notes (Signed)
Radiation Oncology         [phone] ________________________________  Name: [name] MRN: [id number]  Date: 01/17/2015  DOB: April 30, 1960  Follow-Up Visit Note  CC: Redge Gainer, MD  Coralie Keens, MD  Diagnosis: Invasive Lobular Cancer of the Left Breast; ER(86%+), PR (78%+), Her2-neu (Neg Ki67 14%)    ICD-9-CM ICD-10-CM   1. Breast cancer of upper-outer quadrant of left female breast ([place]) 174.4 C50.412     Narrative:  The patient returns today for routine follow-up. She has completed 4 cycles of dose dense Adriamycin and Cytoxan (prev. held chemotherapy for thrombocytopenia, dose reduction for neutropenia). Today was supposed to be cycle 9 of Abraxane, but chemotherapy was discontinued because of profound neutropenia and thrombocytopenia; which did not improve in spite of treatment break. She reports feeling well and is enthusiastic to start radiation therapy.    Breast cancer of upper-outer quadrant of left female breast ([place])   07/18/2014 Mammogram Distortion left breast, breast density category C; U/S 1.3 x 0.8 x 0.8 cm left breast mass at 2:00 position 4 cm from nipple, no lymph nodes   07/20/2014 Initial Diagnosis Left breast Biopsy: Invasive lobular cancer with LCIS, Grade 1, ER 86%, PR 78%, Her 2 Neg Ratio 1.77, Ki 67: 14%   08/01/2014 Breast MRI Breast MRI showed non-mass enhancement 3.9 cm, no lymph nodes   08/08/2014 Surgery Left breast lumpectomy: 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, with LCIS, medial and inferior margins positive, 3/4 lymph nodes positive T2 N1 cM0 stage IIB   08/16/2014 Surgery Left breast medial margin reexcision residual ILC 0.2 cm; inferior margin residual foci less than 0.2 cm, 1/5 lymph nodes positive, 1 lymph node with isolated tumor cells (Overall 4/10)   09/04/2014 - 01/15/2015 Chemotherapy Adjuvant chemotherapy with dose dense Adriamycin and Cytoxan 4 followed by Abraxane weekly 8 ( stopped early for profound neutropenia and  thrombocytopenia)    ALLERGIES:  is allergic to crestor; lipitor; and temazepam.  Meds: Current Outpatient Prescriptions  Medication Sig Dispense Refill  . acetaminophen (TYLENOL) 325 MG tablet Take 650 mg by mouth every 6 (six) hours as needed for moderate pain.    . clonazePAM (KLONOPIN) 0.5 MG tablet TAKE 1 TABLET THREE TIMES A DAY AS NEEDED 90 tablet 0  . lidocaine-prilocaine (EMLA) cream APPLY TO AFFECTED AREA ONCE  3  . pantoprazole (PROTONIX) 20 MG tablet Take 20 mg by mouth daily.    Marland Kitchen PARoxetine (PAXIL-CR) 25 MG 24 hr tablet TAKE 1 TABLET BY MOUTH EVERY DAY 90 tablet 0  . magic mouthwash w/lidocaine SOLN Take 5 mLs by mouth 4 (four) times daily as needed for mouth pain. (Patient not taking: Reported on 01/17/2015) 120 mL 1   No current facility-administered medications for this encounter.    Physical Findings: The patient is in no acute distress. Patient is alert and oriented.  height is 5' 5"  (1.651 m) and weight is 136 lb 3.2 oz (61.78 kg). Her temperature is 98.1 F (36.7 C). Her blood pressure is 123/82 and her pulse is 93.   Well healed surgical incision in the upper outer quadrant of the left breast with some underlying scaring and a well healed incision in the left axilla.  Lab Findings: Lab Results  Component Value Date   WBC 3.1* 01/15/2015   WBC 0.4* 09/11/2014   WBC 5.5 07/21/2013   HGB 12.0 01/15/2015   HGB 10.5* 09/11/2014   HGB 12.9 07/21/2013   HCT 36.1 01/15/2015   HCT 32.6*  09/11/2014   HCT 40.4 07/21/2013   PLT 143* 01/15/2015   PLT 97* 09/11/2014    Lab Results  Component Value Date   NA 140 01/15/2015   NA 132* 09/09/2014   NA 141 07/21/2013   K 3.9 01/15/2015   K 3.6 09/09/2014   CHLORIDE 107 01/15/2015   CO2 24 01/15/2015   CO2 25 09/09/2014   GLUCOSE 99 01/15/2015   GLUCOSE 96 09/09/2014   GLUCOSE 86 07/21/2013   BUN 8.6 01/15/2015   BUN 15 09/09/2014   BUN 14 07/21/2013   CREATININE 0.6 01/15/2015   CREATININE 0.43* 09/09/2014     CREATININE 0.54 10/04/2012   BILITOT 0.49 01/15/2015   BILITOT 0.8 09/09/2014   ALKPHOS 79 01/15/2015   ALKPHOS 70 09/09/2014   AST 36* 01/15/2015   AST 19 09/09/2014   ALT 33 01/15/2015   ALT 26 09/09/2014   PROT 6.8 01/15/2015   PROT 6.2* 09/09/2014   PROT 6.5 07/21/2013   ALBUMIN 3.6 01/15/2015   ALBUMIN 3.5 09/09/2014   ALBUMIN 4.3 07/21/2013   CALCIUM 9.7 01/15/2015   CALCIUM 8.2* 09/09/2014   ANIONGAP 9 01/15/2015   ANIONGAP 8 09/09/2014    Radiographic Findings: No results found.  Impression:  Invasive Lobular Cancer of the Left Breast. The patient is a good candidate for radiation therapy.  Plan: I spoke to the patient today regarding her diagnosis and options for treatment. We discussed the equivalence in terms of survival and local failure between mastectomy and breast conservation. We discussed the role of radiation in decreasing local failures in patients who undergo lumpectomy. We discussed the process of simulation and the placement tattoos. We discussed 4-6 weeks of treatment as an outpatient. We discussed the possibility of asymptomatic lung damage. We discussed the low likelihood of secondary malignancies. We discussed the possible side effects including but not limited to skin redness, fatigue, permanent skin darkening, and breast swelling.  We discussed the use of cardiac sparing with deep inspiration breath hold if needed. The patient signed a consent form and this was placed in her medical chart.  Days that would work out for [place], provided by the patient, would be this Friday or next Monday.  The patient will be treated to the left breast and left supraclavicular region for 6-1/2 weeks. _____________________________________  Jodelle Gross, MD, PhD  This document serves as a record of services personally performed by Kyung Rudd, MD. It was created on his behalf by Darcus Austin, a trained medical scribe. The creation of this record is based on the  scribe's personal observations and the provider's statements to them. This document has been checked and approved by the attending provider.

## 2015-01-19 ENCOUNTER — Encounter: Payer: Self-pay | Admitting: Radiation Oncology

## 2015-01-19 DIAGNOSIS — Z510 Encounter for antineoplastic radiation therapy: Secondary | ICD-10-CM | POA: Diagnosis not present

## 2015-01-22 ENCOUNTER — Other Ambulatory Visit: Payer: PRIVATE HEALTH INSURANCE

## 2015-01-22 ENCOUNTER — Ambulatory Visit: Payer: PRIVATE HEALTH INSURANCE

## 2015-01-23 ENCOUNTER — Other Ambulatory Visit: Payer: Self-pay | Admitting: Family Medicine

## 2015-01-23 NOTE — Telephone Encounter (Signed)
Last filled 12/12/14, last seen by [facility] 07/2013

## 2015-01-24 DIAGNOSIS — Z510 Encounter for antineoplastic radiation therapy: Secondary | ICD-10-CM | POA: Diagnosis not present

## 2015-01-26 DIAGNOSIS — Z510 Encounter for antineoplastic radiation therapy: Secondary | ICD-10-CM | POA: Diagnosis not present

## 2015-01-29 ENCOUNTER — Ambulatory Visit
Admission: RE | Admit: 2015-01-29 | Discharge: 2015-01-29 | Disposition: A | Discharge status: Home / Self Care | Payer: PRIVATE HEALTH INSURANCE | Source: Ambulatory Visit | Attending: Radiation Oncology | Admitting: Radiation Oncology

## 2015-01-29 ENCOUNTER — Ambulatory Visit: Payer: PRIVATE HEALTH INSURANCE

## 2015-01-29 ENCOUNTER — Other Ambulatory Visit: Payer: PRIVATE HEALTH INSURANCE

## 2015-01-29 ENCOUNTER — Other Ambulatory Visit: Payer: Self-pay | Admitting: *Deleted

## 2015-01-29 DIAGNOSIS — Z510 Encounter for antineoplastic radiation therapy: Secondary | ICD-10-CM | POA: Diagnosis not present

## 2015-01-30 ENCOUNTER — Ambulatory Visit
Admission: RE | Admit: 2015-01-30 | Discharge: 2015-01-30 | Disposition: A | Discharge status: Home / Self Care | Payer: PRIVATE HEALTH INSURANCE | Source: Ambulatory Visit | Attending: Radiation Oncology | Admitting: Radiation Oncology

## 2015-01-30 ENCOUNTER — Encounter: Payer: Self-pay | Admitting: Radiation Oncology

## 2015-01-30 VITALS — BP 112/69 | HR 101 | Temp 97.7°F | Resp 20 | Wt 137.7 lb

## 2015-01-30 DIAGNOSIS — C50412 Malignant neoplasm of upper-outer quadrant of left female breast: Secondary | ICD-10-CM | POA: Insufficient documentation

## 2015-01-30 DIAGNOSIS — Z510 Encounter for antineoplastic radiation therapy: Secondary | ICD-10-CM | POA: Diagnosis not present

## 2015-01-30 MED ORDER — RADIAPLEXRX EX GEL
Freq: Once | CUTANEOUS | Status: AC
  Administered 2015-01-30: 13:00:00 via TOPICAL

## 2015-01-30 MED ORDER — ALRA NON-METALLIC DEODORANT (RAD-ONC)
1.0000 "application " | Freq: Once | TOPICAL | Status: AC
  Administered 2015-01-30: 1 via TOPICAL

## 2015-01-30 NOTE — Progress Notes (Signed)
   Department of Radiation Oncology  Phone:  [phone] Fax:        [phone]  Weekly Treatment Note    Name: [name] Date: 01/30/2015 MRN: [id number] DOB: [birth year]   Current dose: 3.6 Gy  Current fraction: 2   MEDICATIONS: Current Outpatient Prescriptions  Medication Sig Dispense Refill  . acetaminophen (TYLENOL) 325 MG tablet Take 650 mg by mouth every 6 (six) hours as needed for moderate pain.    . clonazePAM (KLONOPIN) 0.5 MG tablet TAKE 1 TABLET BY MOUTH 3 TIMES A DAY AS NEEDED 90 tablet 1  . hyaluronate sodium (RADIAPLEXRX) GEL Apply 1 application topically 2 (two) times daily.    . non-metallic deodorant Jethro Poling) MISC Apply 1 application topically daily as needed.    . pantoprazole (PROTONIX) 20 MG tablet Take 20 mg by mouth daily.    Marland Kitchen PARoxetine (PAXIL-CR) 25 MG 24 hr tablet TAKE 1 TABLET BY MOUTH EVERY DAY 90 tablet 0  . lidocaine-prilocaine (EMLA) cream APPLY TO AFFECTED AREA ONCE  3   No current facility-administered medications for this encounter.     ALLERGIES: Crestor; Lipitor; and Temazepam   LABORATORY DATA:  Lab Results  Component Value Date   WBC 3.1* 01/15/2015   HGB 12.0 01/15/2015   HCT 36.1 01/15/2015   MCV 100.3 01/15/2015   PLT 143* 01/15/2015   Lab Results  Component Value Date   NA 140 01/15/2015   K 3.9 01/15/2015   CL 99* 09/09/2014   CO2 24 01/15/2015   Lab Results  Component Value Date   ALT 33 01/15/2015   AST 36* 01/15/2015   ALKPHOS 79 01/15/2015   BILITOT 0.49 01/15/2015     NARRATIVE: [name] was seen today for weekly treatment management. The chart was checked and the patient's films were reviewed.  Weekly rd txs  2/33 tx left breat completed,Patient  Education done, radiation book, alra ,radiaplex gel, my busines card given to pat8ient, skin irritation and fatigue side effects discussed, qpply radaiplex after treatment and bedtime, no pain, appetite good no fatigue Wt Readings from Last 3  Encounters:  01/30/15 137 lb 11.2 oz (62.46 kg)  01/17/15 136 lb 3.2 oz (61.78 kg)  01/15/15 135 lb 4.8 oz (61.372 kg)   BP 112/69 mmHg  Pulse 101  Temp(Src) 97.7 F (36.5 C) (Oral)  Resp 20  Wt 137 lb 11.2 oz (62.46 kg)             PHYSICAL EXAMINATION: weight is 137 lb 11.2 oz (62.46 kg). Her oral temperature is 97.7 F (36.5 C). Her blood pressure is 112/69 and her pulse is 101. Her respiration is 20.        ASSESSMENT: The patient is doing satisfactorily with treatment.  PLAN: We will continue with the patient's radiation treatment as planned.

## 2015-01-30 NOTE — Progress Notes (Signed)
  Radiation Oncology         [phone] ________________________________  Name: JAMIN HUMPHRIES MRN: [id number]  Date: 01/19/2015  DOB: 1960/11/30  SIMULATION AND TREATMENT PLANNING NOTE  The patient presented for simulation prior to beginning her course of radiation treatment for her diagnosis of left-sided breast cancer. The patient was placed in a supine position on a breast board. A customized vac-lock bag was constructed and this complex treatment device will be used on a daily basis during her treatment. In this fashion, a CT scan was obtained through the chest area and an isocenter was placed near the chest wall within the breast.  The patient will be planned to receive a course of radiation initially to a dose of 50.4 Gy. This will consist of a whole breast radiotherapy technique. To accomplish this, 2 customized blocks have been designed which will correspond to medial and lateral whole breast tangent fields. This treatment will be accomplished at 1.8 Gy per fraction. A forward planning technique will also be evaluated to determine if this approach improves the plan. It is anticipated that the patient will then receive a 10 Gy boost to the seroma cavity which has been contoured. This will be accomplished at 2 Gy per fraction.   This initial treatment will consist of a 3-D conformal technique. The seroma has been contoured as the primary target structure. Additionally, dose volume histograms of both this target as well as the lungs and heart will also be evaluated. Such an approach is necessary to ensure that the target area is adequately covered while the nearby critical  normal structures are adequately spared.  Plan:  The final anticipated total dose therefore will correspond to 60.4 Gy.    Special treatment procedure Special treatment procedure was performed today due to the extra time and effort required by myself to plan and prepare this patient for deep inspiration breath hold  technique.  I have determined cardiac sparing to be of benefit to this patient to prevent long term cardiac damage due to radiation of the heart.  Bellows were placed on the patient's abdomen. To facilitate cardiac sparing, the patient was coached by the radiation therapists on breath hold techniques and breathing practice was performed. Practice waveforms were obtained. The patient was then scanned while maintaining breath hold in the treatment position.  This image was then transferred over to the imaging specialist. The imaging specialist then created a fusion of the free breathing and breath hold scans using the chest wall as the stable structure. I personally reviewed the fusion in axial, coronal and sagittal image planes.  Excellent cardiac sparing was obtained.  I felt the patient is an appropriate candidate for breath hold and the patient will be treated as such.  The image fusion was then reviewed with the patient to reinforce the necessity of reproducible breath hold.      _______________________________   Jodelle Gross, MD, PhD

## 2015-01-30 NOTE — Progress Notes (Signed)
Weekly rd txs  2/33 tx left breat completed,Patient  Education done, radiation book, alra ,radiaplex gel, my busines card given to pat8ient, skin irritation and fatigue side effects discussed, qpply radaiplex after treatment and bedtime, no pain, appetite good no fatigue Wt Readings from Last 3 Encounters:  01/30/15 137 lb 11.2 oz (62.46 kg)  01/17/15 136 lb 3.2 oz (61.78 kg)  01/15/15 135 lb 4.8 oz (61.372 kg)   BP 112/69 mmHg  Pulse 101  Temp(Src) 97.7 F (36.5 C) (Oral)  Resp 20  Wt 137 lb 11.2 oz (62.46 kg)

## 2015-01-30 NOTE — Progress Notes (Signed)
  Radiation Oncology         [phone] ________________________________  Name: [name] MRN: [id number]  Date: 01/19/2015  DOB: 1960/04/30  Optical Surface Tracking Plan:  Since intensity modulated radiotherapy (IMRT) and 3D conformal radiation treatment methods are predicated on accurate and precise positioning for treatment, intrafraction motion monitoring is medically necessary to ensure accurate and safe treatment delivery.  The ability to quantify intrafraction motion without excessive ionizing radiation dose can only be performed with optical surface tracking. Accordingly, surface imaging offers the opportunity to obtain 3D measurements of patient position throughout IMRT and 3D treatments without excessive radiation exposure.  I am ordering optical surface tracking for this patient's upcoming course of radiotherapy. ________________________________  Kyung Rudd, MD 01/30/2015 6:07 PM    Reference:   Particia Jasper, et al. Surface imaging-based analysis of intrafraction motion for breast radiotherapy patients.Journal of [place], n. 6, nov. 2014. ISSN [id number].   Available at: <http://www.jacmp.org/index.php/jacmp/article/view/4957>.

## 2015-01-31 ENCOUNTER — Ambulatory Visit
Admission: RE | Admit: 2015-01-31 | Discharge: 2015-01-31 | Disposition: A | Discharge status: Home / Self Care | Payer: PRIVATE HEALTH INSURANCE | Source: Ambulatory Visit | Attending: Radiation Oncology | Admitting: Radiation Oncology

## 2015-01-31 DIAGNOSIS — Z510 Encounter for antineoplastic radiation therapy: Secondary | ICD-10-CM | POA: Diagnosis not present

## 2015-02-05 ENCOUNTER — Other Ambulatory Visit: Payer: PRIVATE HEALTH INSURANCE

## 2015-02-05 ENCOUNTER — Ambulatory Visit: Payer: PRIVATE HEALTH INSURANCE | Admitting: Hematology and Oncology

## 2015-02-05 ENCOUNTER — Ambulatory Visit: Payer: PRIVATE HEALTH INSURANCE

## 2015-02-05 ENCOUNTER — Ambulatory Visit
Admission: RE | Admit: 2015-02-05 | Discharge: 2015-02-05 | Disposition: A | Discharge status: Home / Self Care | Payer: PRIVATE HEALTH INSURANCE | Source: Ambulatory Visit | Attending: Radiation Oncology | Admitting: Radiation Oncology

## 2015-02-05 DIAGNOSIS — Z510 Encounter for antineoplastic radiation therapy: Secondary | ICD-10-CM | POA: Diagnosis not present

## 2015-02-06 ENCOUNTER — Encounter: Payer: Self-pay | Admitting: *Deleted

## 2015-02-06 ENCOUNTER — Ambulatory Visit
Admission: RE | Admit: 2015-02-06 | Discharge: 2015-02-06 | Disposition: A | Discharge status: Home / Self Care | Payer: PRIVATE HEALTH INSURANCE | Source: Ambulatory Visit | Attending: Radiation Oncology | Admitting: Radiation Oncology

## 2015-02-06 ENCOUNTER — Telehealth: Payer: Self-pay | Admitting: *Deleted

## 2015-02-06 DIAGNOSIS — Z510 Encounter for antineoplastic radiation therapy: Secondary | ICD-10-CM | POA: Diagnosis not present

## 2015-02-06 NOTE — Telephone Encounter (Signed)
Called pt to assess needs during xrt. Relate doing well. Pt c/o hand swelling in the am and "difficult time getting up and moving". Informed pt that per Dr. Lindi Adie these are latent side effects from chemo. Received verbal understanding. Denies further needs at this time.

## 2015-02-07 ENCOUNTER — Telehealth: Payer: Self-pay | Admitting: Hematology and Oncology

## 2015-02-07 ENCOUNTER — Ambulatory Visit
Admission: RE | Admit: 2015-02-07 | Discharge: 2015-02-07 | Disposition: A | Discharge status: Home / Self Care | Payer: PRIVATE HEALTH INSURANCE | Source: Ambulatory Visit | Attending: Radiation Oncology | Admitting: Radiation Oncology

## 2015-02-07 ENCOUNTER — Encounter: Payer: Self-pay | Admitting: Radiation Oncology

## 2015-02-07 DIAGNOSIS — Z510 Encounter for antineoplastic radiation therapy: Secondary | ICD-10-CM | POA: Diagnosis not present

## 2015-02-07 NOTE — Telephone Encounter (Signed)
lvm for pt regarding to  Feb 2017 appt.Marland KitchenMarland KitchenMarland Kitchen

## 2015-02-08 ENCOUNTER — Ambulatory Visit
Admission: RE | Admit: 2015-02-08 | Discharge: 2015-02-08 | Disposition: A | Discharge status: Home / Self Care | Payer: PRIVATE HEALTH INSURANCE | Source: Ambulatory Visit | Attending: Radiation Oncology | Admitting: Radiation Oncology

## 2015-02-08 DIAGNOSIS — Z510 Encounter for antineoplastic radiation therapy: Secondary | ICD-10-CM | POA: Diagnosis not present

## 2015-02-09 ENCOUNTER — Ambulatory Visit
Admission: RE | Admit: 2015-02-09 | Discharge: 2015-02-09 | Disposition: A | Discharge status: Home / Self Care | Payer: PRIVATE HEALTH INSURANCE | Source: Ambulatory Visit | Attending: Radiation Oncology | Admitting: Radiation Oncology

## 2015-02-09 ENCOUNTER — Encounter: Payer: Self-pay | Admitting: Radiation Oncology

## 2015-02-09 VITALS — BP 111/63 | HR 93 | Temp 99.0°F | Ht 65.0 in | Wt 142.5 lb

## 2015-02-09 DIAGNOSIS — C50412 Malignant neoplasm of upper-outer quadrant of left female breast: Secondary | ICD-10-CM

## 2015-02-09 DIAGNOSIS — Z510 Encounter for antineoplastic radiation therapy: Secondary | ICD-10-CM | POA: Diagnosis not present

## 2015-02-09 MED ORDER — RADIAPLEXRX EX GEL
Freq: Once | CUTANEOUS | Status: AC
  Administered 2015-02-09: 12:00:00 via TOPICAL

## 2015-02-09 NOTE — Progress Notes (Signed)
  Radiation Oncology         [phone] ________________________________  Name: [name] MRN: [id number]  Date: 02/07/2015  DOB: 18-Oct-1960  Complex simulation note  The patient has undergone complex simulation for her upcoming boost treatment for her diagnosis of breast cancer. The patient has initially been planned to receive 50.4 Gy. The patient will now receive a 10 Gy boost to the seroma cavity which has been contoured. This will be accomplished using an en face electron field. Based on the depth of the target area, 12 MeV electrons will be used. The patient's final total dose therefore will be 60.4 Gy. A complex isodose plan is requested for the boost treatment.   _______________________________  Jodelle Gross, MD, PhD

## 2015-02-09 NOTE — Progress Notes (Signed)
   Department of Radiation Oncology  Phone:  [phone] Fax:        [phone]  Weekly Treatment Note    Name: [name] Date: 02/09/2015 MRN: [id number] DOB: 1960-07-17   Current dose: 14.4 Gy  Current fraction: 8   MEDICATIONS: Current Outpatient Prescriptions  Medication Sig Dispense Refill  . acetaminophen (TYLENOL) 325 MG tablet Take 650 mg by mouth every 6 (six) hours as needed for moderate pain.    . clonazePAM (KLONOPIN) 0.5 MG tablet TAKE 1 TABLET BY MOUTH 3 TIMES A DAY AS NEEDED 90 tablet 1  . hyaluronate sodium (RADIAPLEXRX) GEL Apply 1 application topically 2 (two) times daily.    Marland Kitchen lidocaine-prilocaine (EMLA) cream APPLY TO AFFECTED AREA ONCE  3  . non-metallic deodorant (ALRA) MISC Apply 1 application topically daily as needed.    . pantoprazole (PROTONIX) 20 MG tablet Take 20 mg by mouth daily.    Marland Kitchen PARoxetine (PAXIL-CR) 25 MG 24 hr tablet TAKE 1 TABLET BY MOUTH EVERY DAY 90 tablet 0   No current facility-administered medications for this encounter.     ALLERGIES: Crestor; Lipitor; and Temazepam   LABORATORY DATA:  Lab Results  Component Value Date   WBC 3.1* 01/15/2015   HGB 12.0 01/15/2015   HCT 36.1 01/15/2015   MCV 100.3 01/15/2015   PLT 143* 01/15/2015   Lab Results  Component Value Date   NA 140 01/15/2015   K 3.9 01/15/2015   CL 99* 09/09/2014   CO2 24 01/15/2015   Lab Results  Component Value Date   ALT 33 01/15/2015   AST 36* 01/15/2015   ALKPHOS 79 01/15/2015   BILITOT 0.49 01/15/2015     NARRATIVE: [name] was seen today for weekly treatment management. The chart was checked and the patient's films were reviewed.  Weekly rd txs  8/33 tx left breat completed. [name] has completed 8 fractions to her left breast. She denies pain and fatigue. She is using radiaplex and has requested a refill. Another tube has been given. The skin on her left breat is intact. She has slight redness around her incisions. She  wants to get the port-a-cath removed.  Wt Readings from Last 3 Encounters:  02/09/15 142 lb 8 oz (64.638 kg)  01/30/15 137 lb 11.2 oz (62.46 kg)  01/17/15 136 lb 3.2 oz (61.78 kg)   BP 111/63 mmHg  Pulse 93  Temp(Src) 99 F (37.2 C) (Oral)  Ht '5\' 5"'$  (1.651 m)  Wt 142 lb 8 oz (64.638 kg)  BMI 23.71 kg/m2  PHYSICAL EXAMINATION: height is '5\' 5"'$  (1.651 m) and weight is 142 lb 8 oz (64.638 kg). Her oral temperature is 99 F (37.2 C). Her blood pressure is 111/63 and her pulse is 93.        ASSESSMENT: The patient is doing satisfactorily with treatment.  PLAN: We will continue with the patient's radiation treatment as planned.       This document serves as a record of services personally performed by Tyler Pita, MD. It was created on his behalf by Lendon Collar, a trained medical scribe. The creation of this record is based on the scribe's personal observations and the provider's statements to them. This document has been checked and approved by the attending provider.

## 2015-02-09 NOTE — Progress Notes (Signed)
[name] has comp[eted 8 fractions to her left breast.  She denies pain and fatigue.  She is using radiaplex and has requested a refill.  Another tube has been given.  The skin on her left breat is intact.  She has slight redness around her incisions.  BP 111/63 mmHg  Pulse 93  Temp(Src) 99 F (37.2 C) (Oral)  Ht '5\' 5"'$  (1.651 m)  Wt 142 lb 8 oz (64.638 kg)  BMI 23.71 kg/m2

## 2015-02-12 ENCOUNTER — Ambulatory Visit
Admission: RE | Admit: 2015-02-12 | Discharge: 2015-02-12 | Disposition: A | Discharge status: Home / Self Care | Payer: PRIVATE HEALTH INSURANCE | Source: Ambulatory Visit | Attending: Radiation Oncology | Admitting: Radiation Oncology

## 2015-02-12 DIAGNOSIS — Z510 Encounter for antineoplastic radiation therapy: Secondary | ICD-10-CM | POA: Diagnosis not present

## 2015-02-13 ENCOUNTER — Other Ambulatory Visit: Payer: Self-pay | Admitting: Surgery

## 2015-02-13 ENCOUNTER — Other Ambulatory Visit: Payer: Self-pay | Admitting: *Deleted

## 2015-02-13 ENCOUNTER — Ambulatory Visit
Admission: RE | Admit: 2015-02-13 | Discharge: 2015-02-13 | Disposition: A | Discharge status: Home / Self Care | Payer: PRIVATE HEALTH INSURANCE | Source: Ambulatory Visit | Attending: Radiation Oncology | Admitting: Radiation Oncology

## 2015-02-13 DIAGNOSIS — Z510 Encounter for antineoplastic radiation therapy: Secondary | ICD-10-CM | POA: Diagnosis not present

## 2015-02-13 DIAGNOSIS — C50412 Malignant neoplasm of upper-outer quadrant of left female breast: Secondary | ICD-10-CM

## 2015-02-13 MED ORDER — MAGIC MOUTHWASH W/LIDOCAINE: 5.0000 mL | Freq: Four times a day (QID) | ORAL | Status: DC | PRN

## 2015-02-13 MED ORDER — FLUCONAZOLE 100 MG PO TABS: 100.0000 mg | ORAL_TABLET | Freq: Every day | ORAL | Status: DC

## 2015-02-13 NOTE — Telephone Encounter (Signed)
Pt called c/o 2 mouth sores under her tongue. Per Dr. Lindi Adie called in magic mouth and diflucan to requested pharmacy. Pt may have port removed. Message sent to Dr. Ninfa Linden and his office.

## 2015-02-14 ENCOUNTER — Ambulatory Visit
Admission: RE | Admit: 2015-02-14 | Discharge: 2015-02-14 | Disposition: A | Discharge status: Home / Self Care | Payer: PRIVATE HEALTH INSURANCE | Source: Ambulatory Visit | Attending: Radiation Oncology | Admitting: Radiation Oncology

## 2015-02-14 DIAGNOSIS — Z510 Encounter for antineoplastic radiation therapy: Secondary | ICD-10-CM | POA: Diagnosis not present

## 2015-02-15 ENCOUNTER — Ambulatory Visit: Payer: PRIVATE HEALTH INSURANCE | Admitting: Radiation Oncology

## 2015-02-15 ENCOUNTER — Ambulatory Visit
Admission: RE | Admit: 2015-02-15 | Discharge: 2015-02-15 | Disposition: A | Discharge status: Home / Self Care | Payer: PRIVATE HEALTH INSURANCE | Source: Ambulatory Visit | Attending: Radiation Oncology | Admitting: Radiation Oncology

## 2015-02-15 DIAGNOSIS — Z510 Encounter for antineoplastic radiation therapy: Secondary | ICD-10-CM | POA: Diagnosis not present

## 2015-02-16 ENCOUNTER — Ambulatory Visit
Admission: RE | Admit: 2015-02-16 | Discharge: 2015-02-16 | Disposition: A | Discharge status: Home / Self Care | Payer: PRIVATE HEALTH INSURANCE | Source: Ambulatory Visit | Attending: Radiation Oncology | Admitting: Radiation Oncology

## 2015-02-16 ENCOUNTER — Encounter: Payer: Self-pay | Admitting: Radiation Oncology

## 2015-02-16 VITALS — BP 127/79 | HR 95 | Temp 98.3°F | Ht 65.0 in | Wt 141.5 lb

## 2015-02-16 DIAGNOSIS — C50412 Malignant neoplasm of upper-outer quadrant of left female breast: Secondary | ICD-10-CM

## 2015-02-16 DIAGNOSIS — Z510 Encounter for antineoplastic radiation therapy: Secondary | ICD-10-CM | POA: Diagnosis not present

## 2015-02-16 NOTE — Progress Notes (Signed)
[name] is here for her 13th fraction of radiation to her Left Breast. She denies any pain at this time. She does have some mucositis in her mouth which she is taking diflucan and magic mouthwash for. She denies any fatigue at this time, and is working full time. Her Left breast is slightly red especially on the upper medial area. She is using the radiaplex cream twice daily, and denies any itching at this area. She is concerned that her Left Breast has gotten smaller since radiation started.   BP 127/79 mmHg  Pulse 95  Temp(Src) 98.3 F (36.8 C)  Ht '5\' 5"'$  (1.651 m)  Wt 141 lb 8 oz (64.184 kg)  BMI 23.55 kg/m2   Wt Readings from Last 3 Encounters:  02/16/15 141 lb 8 oz (64.184 kg)  02/09/15 142 lb 8 oz (64.638 kg)  01/30/15 137 lb 11.2 oz (62.46 kg)

## 2015-02-16 NOTE — Progress Notes (Signed)
  Radiation Oncology         [phone]     Name: [name] MRN: [id number]   Date: 02/16/2015  DOB: 06-25-1960   Weekly Radiation Therapy Management    ICD-9-CM ICD-10-CM   1. Breast cancer of upper-outer quadrant of left female breast ([place]) 174.4 C50.412     Current Dose: 23.4 Gy  Planned Dose:  60.4 Gy  Narrative The patient presents for routine under treatment assessment. Ms. Chaisson is here for her 13th fraction of radiation to her Left Breast. She denies any pain at this time. She does have some mucositis in her mouth which she is taking diflucan and magic mouthwash for. She denies any fatigue at this time, and is working full time. Her Left breast is slightly red especially on the upper medial area. She is using the radiaplex cream twice daily, and denies any itching at this area. She is concerned that her Left Breast has gotten smaller since radiation started. Her lumpectomy was performed in June. She plans to have breast reconstructive surgery in the future. The patient is without complaint. Set-up films were reviewed. The chart was checked.  Physical Findings  height is '5\' 5"'$  (1.651 m) and weight is 141 lb 8 oz (64.184 kg). Her temperature is 98.3 F (36.8 C). Her blood pressure is 127/79 and her pulse is 95. . Weight essentially stable.  No significant changes. Left breast is smaller than the right. No palpable seroma. The left breast is mildly erythematous with no desquamation.   Impression The patient is tolerating radiation.  Plan Continue treatment as planned.         Sheral Apley Tammi Klippel, M.D.  This document serves as a record of services personally performed by Tyler Pita, MD. It was created on his behalf by Arlyce Harman, a trained medical scribe. The creation of this record is based on the scribe's personal observations and the provider's statements to them. This document has been checked and approved by the attending provider.

## 2015-02-19 ENCOUNTER — Ambulatory Visit
Admission: RE | Admit: 2015-02-19 | Discharge: 2015-02-19 | Disposition: A | Discharge status: Home / Self Care | Payer: PRIVATE HEALTH INSURANCE | Source: Ambulatory Visit | Attending: Radiation Oncology | Admitting: Radiation Oncology

## 2015-02-19 DIAGNOSIS — Z510 Encounter for antineoplastic radiation therapy: Secondary | ICD-10-CM | POA: Diagnosis not present

## 2015-02-20 ENCOUNTER — Ambulatory Visit
Admission: RE | Admit: 2015-02-20 | Discharge: 2015-02-20 | Disposition: A | Discharge status: Home / Self Care | Payer: PRIVATE HEALTH INSURANCE | Source: Ambulatory Visit | Attending: Radiation Oncology | Admitting: Radiation Oncology

## 2015-02-20 DIAGNOSIS — Z510 Encounter for antineoplastic radiation therapy: Secondary | ICD-10-CM | POA: Diagnosis not present

## 2015-02-21 ENCOUNTER — Ambulatory Visit
Admission: RE | Admit: 2015-02-21 | Discharge: 2015-02-21 | Disposition: A | Discharge status: Home / Self Care | Payer: PRIVATE HEALTH INSURANCE | Source: Ambulatory Visit | Attending: Radiation Oncology | Admitting: Radiation Oncology

## 2015-02-21 DIAGNOSIS — Z510 Encounter for antineoplastic radiation therapy: Secondary | ICD-10-CM | POA: Diagnosis not present

## 2015-02-22 ENCOUNTER — Ambulatory Visit
Admission: RE | Admit: 2015-02-22 | Discharge: 2015-02-22 | Disposition: A | Discharge status: Home / Self Care | Payer: PRIVATE HEALTH INSURANCE | Source: Ambulatory Visit | Attending: Radiation Oncology | Admitting: Radiation Oncology

## 2015-02-22 DIAGNOSIS — Z510 Encounter for antineoplastic radiation therapy: Secondary | ICD-10-CM | POA: Diagnosis not present

## 2015-02-23 ENCOUNTER — Ambulatory Visit
Admission: RE | Admit: 2015-02-23 | Discharge: 2015-02-23 | Disposition: A | Discharge status: Home / Self Care | Payer: PRIVATE HEALTH INSURANCE | Source: Ambulatory Visit | Attending: Radiation Oncology | Admitting: Radiation Oncology

## 2015-02-23 ENCOUNTER — Encounter: Payer: Self-pay | Admitting: Radiation Oncology

## 2015-02-23 VITALS — BP 118/83 | HR 83 | Temp 98.1°F | Resp 18 | Ht 65.0 in | Wt 141.9 lb

## 2015-02-23 DIAGNOSIS — Z510 Encounter for antineoplastic radiation therapy: Secondary | ICD-10-CM | POA: Diagnosis not present

## 2015-02-23 DIAGNOSIS — C50412 Malignant neoplasm of upper-outer quadrant of left female breast: Secondary | ICD-10-CM | POA: Insufficient documentation

## 2015-02-23 MED ORDER — SONAFINE EX EMUL
1.0000 "application " | Freq: Two times a day (BID) | CUTANEOUS | Status: DC
  Administered 2015-02-23: 1 via TOPICAL
  Filled 2015-02-23: qty 45

## 2015-02-23 NOTE — Progress Notes (Signed)
[name] has completed 17 fractions to her left breast.  She denies pain and fatigue.  The skin on her left breast and subclavian area is red.  She reports it is itching.  She has been given sonafine cream to try.   BP 118/83 mmHg  Pulse 83  Temp(Src) 98.1 F (36.7 C) (Oral)  Resp 18  Ht '5\' 5"'$  (1.651 m)  Wt 141 lb 14.4 oz (64.365 kg)  BMI 23.61 kg/m2

## 2015-02-23 NOTE — Progress Notes (Signed)
   Department of Radiation Oncology  Phone:  [phone] Fax:        [phone]  Weekly Treatment Note    Name: [name] Date: 02/23/2015 MRN: [id number] DOB: 01-11-1961   Diagnosis:     ICD-9-CM ICD-10-CM   1. Breast cancer of upper-outer quadrant of left female breast (HCC) 174.4 C50.412 SONAFINE emulsion 1 application     Current dose: 32.4 Gy  Current fraction: 18   MEDICATIONS: Current Outpatient Prescriptions  Medication Sig Dispense Refill  . acetaminophen (TYLENOL) 325 MG tablet Take 650 mg by mouth every 6 (six) hours as needed for moderate pain.    . clonazePAM (KLONOPIN) 0.5 MG tablet TAKE 1 TABLET BY MOUTH 3 TIMES A DAY AS NEEDED 90 tablet 1  . fluconazole (DIFLUCAN) 100 MG tablet Take 1 tablet (100 mg total) by mouth daily. 14 tablet 0  . hyaluronate sodium (RADIAPLEXRX) GEL Apply 1 application topically 2 (two) times daily.    . magic mouthwash w/lidocaine SOLN Take 5 mLs by mouth 4 (four) times daily as needed for mouth pain. 90 mL 0  . non-metallic deodorant (ALRA) MISC Apply 1 application topically daily as needed.    . pantoprazole (PROTONIX) 20 MG tablet Take 20 mg by mouth daily.    Marland Kitchen PARoxetine (PAXIL-CR) 25 MG 24 hr tablet TAKE 1 TABLET BY MOUTH EVERY DAY 90 tablet 0   Current Facility-Administered Medications  Medication Dose Route Frequency Provider Last Rate Last Dose  . SONAFINE emulsion 1 application  1 application Topical BID Kyung Rudd, MD   1 application at 87/56/43 1158     ALLERGIES: Crestor; Lipitor; and Temazepam   LABORATORY DATA:  Lab Results  Component Value Date   WBC 3.1* 01/15/2015   HGB 12.0 01/15/2015   HCT 36.1 01/15/2015   MCV 100.3 01/15/2015   PLT 143* 01/15/2015   Lab Results  Component Value Date   NA 140 01/15/2015   K 3.9 01/15/2015   CL 99* 09/09/2014   CO2 24 01/15/2015   Lab Results  Component Value Date   ALT 33 01/15/2015   AST 36* 01/15/2015   ALKPHOS 79 01/15/2015   BILITOT 0.49  01/15/2015     NARRATIVE: [name] was seen today for weekly treatment management. The chart was checked and the patient's films were reviewed.  [name] has completed 17 fractions to her left breast.  She denies pain and fatigue.  The skin on her left breast and subclavian area is red.  She reports it is itching.  She has been given sonafine cream to try.   BP 118/83 mmHg  Pulse 83  Temp(Src) 98.1 F (36.7 C) (Oral)  Resp 18  Ht '5\' 5"'$  (1.651 m)  Wt 141 lb 14.4 oz (64.365 kg)  BMI 23.61 kg/m2  PHYSICAL EXAMINATION: height is '5\' 5"'$  (1.651 m) and weight is 141 lb 14.4 oz (64.365 kg). Her oral temperature is 98.1 F (36.7 C). Her blood pressure is 118/83 and her pulse is 83. Her respiration is 18.      the patient's skin shows some erythema/hyperpigmentation. No significant desquamation.  ASSESSMENT: The patient is doing satisfactorily with treatment.  PLAN: We will continue with the patient's radiation treatment as planned.

## 2015-02-26 ENCOUNTER — Ambulatory Visit
Admission: RE | Admit: 2015-02-26 | Discharge: 2015-02-26 | Disposition: A | Discharge status: Home / Self Care | Payer: PRIVATE HEALTH INSURANCE | Source: Ambulatory Visit | Attending: Radiation Oncology | Admitting: Radiation Oncology

## 2015-02-26 DIAGNOSIS — Z510 Encounter for antineoplastic radiation therapy: Secondary | ICD-10-CM | POA: Diagnosis not present

## 2015-02-27 ENCOUNTER — Ambulatory Visit
Admission: RE | Admit: 2015-02-27 | Discharge: 2015-02-27 | Disposition: A | Discharge status: Home / Self Care | Payer: PRIVATE HEALTH INSURANCE | Source: Ambulatory Visit | Attending: Radiation Oncology | Admitting: Radiation Oncology

## 2015-02-27 DIAGNOSIS — Z510 Encounter for antineoplastic radiation therapy: Secondary | ICD-10-CM | POA: Diagnosis not present

## 2015-02-28 ENCOUNTER — Ambulatory Visit
Admission: RE | Admit: 2015-02-28 | Discharge: 2015-02-28 | Disposition: A | Discharge status: Home / Self Care | Payer: PRIVATE HEALTH INSURANCE | Source: Ambulatory Visit | Attending: Radiation Oncology | Admitting: Radiation Oncology

## 2015-02-28 DIAGNOSIS — Z510 Encounter for antineoplastic radiation therapy: Secondary | ICD-10-CM | POA: Diagnosis not present

## 2015-02-28 NOTE — Patient Instructions (Signed)
PERLINE AWE  02/28/2015   Your procedure is scheduled on: 03/08/2015    Report to [facility] Main  Entrance take [place]  elevators to 3rd floor to  [place] at     [phone].  Call this number if you have problems the morning of surgery [phone]   Remember: ONLY 1 PERSON MAY GO WITH YOU TO SHORT STAY TO GET  READY MORNING OF [place].  Do not eat food or drink liquids :After Midnight.     Take these medicines the morning of surgery with A SIP OF WATER:   Klonopin if needed, Isopto tears if needed, Protonix                                 You may not have any metal on your body including hair pins and              piercings  Do not wear jewelry, make-up, lotions, powders or perfumes, deodorant             Do not wear nail polish.  Do not shave  48 hours prior to surgery.             Do not bring valuables to the hospital. [place].  Contacts, dentures or bridgework may not be worn into surgery.       Patients discharged the day of surgery will not be allowed to drive home.  Name and phone number of your driver:  Special Instructions: coughing and deep breathing exercises, leg exercises               Please read over the following fact sheets you were given: _____________________________________________________________________             [facility] - Preparing for Surgery Before surgery, you can play an important role.  Because skin is not sterile, your skin needs to be as free of germs as possible.  You can reduce the number of germs on your skin by washing with CHG (chlorahexidine gluconate) soap before surgery.  CHG is an antiseptic cleaner which kills germs and bonds with the skin to continue killing germs even after washing. Please DO NOT use if you have an allergy to CHG or antibacterial soaps.  If your skin becomes reddened/irritated stop using the CHG and inform your nurse when  you arrive at Short Stay. Do not shave (including legs and underarms) for at least 48 hours prior to the first CHG shower.  You may shave your face/neck. Please follow these instructions carefully:  1.  Shower with CHG Soap the night before surgery and the  morning of Surgery.  2.  If you choose to wash your hair, wash your hair first as usual with your  normal  shampoo.  3.  After you shampoo, rinse your hair and body thoroughly to remove the  shampoo.                           4.  Use CHG as you would any other liquid soap.  You can apply chg directly  to the skin and wash  Gently with a scrungie or clean washcloth.  5.  Apply the CHG Soap to your body ONLY FROM THE NECK DOWN.   Do not use on face/ open                           Wound or open sores. Avoid contact with eyes, ears mouth and genitals (private parts).                       Wash face,  Genitals (private parts) with your normal soap.             6.  Wash thoroughly, paying special attention to the area where your surgery  will be performed.  7.  Thoroughly rinse your body with warm water from the neck down.  8.  DO NOT shower/wash with your normal soap after using and rinsing off  the CHG Soap.                9.  Pat yourself dry with a clean towel.            10.  Wear clean pajamas.            11.  Place clean sheets on your bed the night of your first shower and do not  sleep with pets. Day of Surgery : Do not apply any lotions/deodorants the morning of surgery.  Please wear clean clothes to the hospital/surgery center.  FAILURE TO FOLLOW THESE INSTRUCTIONS MAY RESULT IN THE CANCELLATION OF YOUR SURGERY PATIENT SIGNATURE_________________________________  NURSE SIGNATURE__________________________________  ________________________________________________________________________

## 2015-03-01 ENCOUNTER — Encounter (HOSPITAL_COMMUNITY): Payer: Self-pay

## 2015-03-01 ENCOUNTER — Encounter (HOSPITAL_COMMUNITY)
Admission: RE | Admit: 2015-03-01 | Discharge: 2015-03-01 | Disposition: A | Discharge status: Home / Self Care | Payer: PRIVATE HEALTH INSURANCE | Source: Ambulatory Visit | Attending: Surgery | Admitting: Surgery

## 2015-03-01 ENCOUNTER — Ambulatory Visit
Admission: RE | Admit: 2015-03-01 | Discharge: 2015-03-01 | Disposition: A | Discharge status: Home / Self Care | Payer: PRIVATE HEALTH INSURANCE | Source: Ambulatory Visit | Attending: Radiation Oncology | Admitting: Radiation Oncology

## 2015-03-01 DIAGNOSIS — Z510 Encounter for antineoplastic radiation therapy: Secondary | ICD-10-CM | POA: Diagnosis not present

## 2015-03-01 DIAGNOSIS — C50919 Malignant neoplasm of unspecified site of unspecified female breast: Secondary | ICD-10-CM | POA: Diagnosis not present

## 2015-03-01 DIAGNOSIS — Z01812 Encounter for preprocedural laboratory examination: Secondary | ICD-10-CM | POA: Insufficient documentation

## 2015-03-01 LAB — CBC
HEMATOCRIT: 36.6 % (ref 36.0–46.0)
HEMOGLOBIN: 12.6 g/dL (ref 12.0–15.0)
MCH: 34 pg (ref 26.0–34.0)
MCHC: 34.4 g/dL (ref 30.0–36.0)
MCV: 98.7 fL (ref 78.0–100.0)
Platelets: 136 10*3/uL — ABNORMAL LOW (ref 150–400)
RBC: 3.71 MIL/uL — ABNORMAL LOW (ref 3.87–5.11)
RDW: 13.4 % (ref 11.5–15.5)
WBC: 2.3 10*3/uL — AB (ref 4.0–10.5)

## 2015-03-01 NOTE — Progress Notes (Signed)
CBC results from 03/01/15 sent via EPIC to Dr Nedra Hai.

## 2015-03-02 ENCOUNTER — Ambulatory Visit
Admission: RE | Admit: 2015-03-02 | Discharge: 2015-03-02 | Disposition: A | Discharge status: Home / Self Care | Payer: PRIVATE HEALTH INSURANCE | Source: Ambulatory Visit | Attending: Radiation Oncology | Admitting: Radiation Oncology

## 2015-03-02 DIAGNOSIS — C50412 Malignant neoplasm of upper-outer quadrant of left female breast: Secondary | ICD-10-CM

## 2015-03-02 DIAGNOSIS — Z510 Encounter for antineoplastic radiation therapy: Secondary | ICD-10-CM | POA: Diagnosis not present

## 2015-03-02 MED ORDER — SONAFINE EX EMUL
1.0000 "application " | Freq: Two times a day (BID) | CUTANEOUS | Status: DC
  Administered 2015-03-02: 1 via TOPICAL
  Filled 2015-03-02: qty 45

## 2015-03-02 NOTE — Progress Notes (Signed)
MD saw patient in the back [place] table ,not sent to nursing for assessment

## 2015-03-02 NOTE — Progress Notes (Signed)
   Department of Radiation Oncology  Phone:  [phone] Fax:        [phone]  Weekly Treatment Note    Name: [name] Date: 03/02/2015 MRN: [id number] DOB: Jul 17, 1960   Diagnosis:     ICD-9-CM ICD-10-CM   1. Breast cancer of upper-outer quadrant of left female breast ([place]) 174.4 C50.412      Current dose: 41.4 Gy  Current fraction: 23   MEDICATIONS: Current Outpatient Prescriptions  Medication Sig Dispense Refill  . acetaminophen (TYLENOL) 325 MG tablet Take 975 mg by mouth as directed. Usually takes twice daily in morning and bedtime and additionally during the day if needed for pain    . clonazePAM (KLONOPIN) 0.5 MG tablet TAKE 1 TABLET BY MOUTH 3 TIMES A DAY AS NEEDED (Patient taking differently: TAKE 1 TABLET BY MOUTH 3 TIMES A DAY AS NEEDED FOR MENARDS DISEASE) 90 tablet 1  . hyaluronate sodium (RADIAPLEXRX) GEL Apply 1 application topically 2 (two) times daily.    . hydroxypropyl methylcellulose / hypromellose (ISOPTO TEARS / GONIOVISC) 2.5 % ophthalmic solution Place 1 drop into both eyes 3 (three) times daily as needed for dry eyes.    . magic mouthwash w/lidocaine SOLN Take 5 mLs by mouth 4 (four) times daily as needed for mouth pain. 90 mL 0  . non-metallic deodorant (ALRA) MISC Apply 1 application topically daily as needed (RADIATION BURNS).     . pantoprazole (PROTONIX) 20 MG tablet Take 20 mg by mouth daily.    Marland Kitchen PARoxetine (PAXIL-CR) 25 MG 24 hr tablet TAKE 1 TABLET BY MOUTH EVERY DAY (Patient taking differently: TAKE 1 TABLET BY MOUTH EVERY DAY AT BEDTIME) 90 tablet 0   No current facility-administered medications for this encounter.   Facility-Administered Medications Ordered in Other Encounters  Medication Dose Route Frequency Provider Last Rate Last Dose  . SONAFINE emulsion 1 application  1 application Topical BID Kyung Rudd, MD   1 application at 24/40/10 1013     ALLERGIES: Crestor; Lipitor; and Temazepam   LABORATORY DATA:  Lab  Results  Component Value Date   WBC 2.3* 03/01/2015   HGB 12.6 03/01/2015   HCT 36.6 03/01/2015   MCV 98.7 03/01/2015   PLT 136* 03/01/2015   Lab Results  Component Value Date   NA 140 01/15/2015   K 3.9 01/15/2015   CL 99* 09/09/2014   CO2 24 01/15/2015   Lab Results  Component Value Date   ALT 33 01/15/2015   AST 36* 01/15/2015   ALKPHOS 79 01/15/2015   BILITOT 0.49 01/15/2015     NARRATIVE: [name] was seen today for weekly treatment management. The chart was checked and the patient's films were reviewed.  The patient is doing well with her treatment currently. No major changes over the last week.  PHYSICAL EXAMINATION: vitals were not taken for this visit.     hyperpigmentation in the treatment area without significant desquamation.  ASSESSMENT: The patient is doing satisfactorily with treatment.  PLAN: We will continue with the patient's radiation treatment as planned.

## 2015-03-06 ENCOUNTER — Ambulatory Visit
Admission: RE | Admit: 2015-03-06 | Discharge: 2015-03-06 | Disposition: A | Discharge status: Home / Self Care | Payer: PRIVATE HEALTH INSURANCE | Source: Ambulatory Visit | Attending: Radiation Oncology | Admitting: Radiation Oncology

## 2015-03-06 DIAGNOSIS — Z510 Encounter for antineoplastic radiation therapy: Secondary | ICD-10-CM | POA: Diagnosis not present

## 2015-03-07 ENCOUNTER — Ambulatory Visit
Admission: RE | Admit: 2015-03-07 | Discharge: 2015-03-07 | Disposition: A | Discharge status: Home / Self Care | Payer: PRIVATE HEALTH INSURANCE | Source: Ambulatory Visit | Attending: Radiation Oncology | Admitting: Radiation Oncology

## 2015-03-07 DIAGNOSIS — Z510 Encounter for antineoplastic radiation therapy: Secondary | ICD-10-CM | POA: Diagnosis not present

## 2015-03-07 NOTE — Anesthesia Preprocedure Evaluation (Addendum)
Anesthesia Evaluation  Patient identified by MRN, date of birth, ID band Patient awake    Reviewed: Allergy & Precautions, Patient's Chart, lab work & pertinent test results  History of Anesthesia Complications Negative for: history of anesthetic complications  Airway Mallampati: II  TM Distance: >3 FB Neck ROM: Full    Dental  (+) Teeth Intact, Dental Advisory Given   Pulmonary neg pulmonary ROS,    Pulmonary exam normal        Cardiovascular negative cardio ROS Normal cardiovascular exam     Neuro/Psych  Headaches, PSYCHIATRIC DISORDERS Anxiety Depression    GI/Hepatic negative GI ROS, Neg liver ROS,   Endo/Other  negative endocrine ROS  Renal/GU      Musculoskeletal   Abdominal   Peds  Hematology   Anesthesia Other Findings   Reproductive/Obstetrics                            Anesthesia Physical  Anesthesia Plan  ASA: II  Anesthesia Plan: MAC   Post-op Pain Management:    Induction:   Airway Management Planned: Simple Face Mask  Additional Equipment:   Intra-op Plan:   Post-operative Plan:   Informed Consent: I have reviewed the patients History and Physical, chart, labs and discussed the procedure including the risks, benefits and alternatives for the proposed anesthesia with the patient or authorized representative who has indicated his/her understanding and acceptance.   Dental advisory given  Plan Discussed with: CRNA, Anesthesiologist and Surgeon  Anesthesia Plan Comments:        Anesthesia Quick Evaluation

## 2015-03-07 NOTE — H&P (Signed)
[name] is an 54 y.o. female.   Chief Complaint: Port-A-Cath no longer in use HPI: this is a pleasant female with a history of left breast cancer who is now completed therapy. Oncology has requested a Port-A-Cath be removed as it is no longer in use.  She is doing well and has no complaints.  Past Medical History  Diagnosis Date  . Cervical stenosis (uterine cervix)   . Abnormal Pap smear of cervix     Dr. Eilleen Kempf   . Hyperlipidemia   . Dizziness   . Insomnia   . Nervousness(799.21)   . Meniere's disease   . Benign hemangioma     Dr,  Marybelle Killings   . Allergy   . Breast cancer ([place]) 07/20/14    Left Breast  . S/P chemotherapy, time since 4-12 weeks     Past Surgical History  Procedure Laterality Date  . Uterine ablation  02/2006     Due to heavy periods Dr. Jon Billings   . Bilateral  tubal ligation  05/08/2004  . Tubal ligation    . Wisdom teeth extractions  yrs ago  . Shoulder open rotator cuff repair Right 07/28/2012    Procedure: RIGHT SHOULDER DECOMPRESSION AND EXCISION OF CALCIFIC DEPOSIT;  Surgeon: Tobi Bastos, MD;  Location: [facility];  Service: Orthopedics;  Laterality: Right;  . Breast lumpectomy with radioactive seed and sentinel lymph node biopsy Left 08/08/2014    Procedure: BREAST LUMPECTOMY WITH RADIOACTIVE SEED AND SENTINEL LYMPH NODE BIOPSY;  Surgeon: Coralie Keens, MD;  Location: [place];  Service: General;  Laterality: Left;  . Re-excision of breast lumpectomy Left 08/16/2014    Procedure: RE-EXCISION OF LEFT BREAST CANCER AND PORT PLACEMENT/POSSIBLE AXILLARY DISECTION;  Surgeon: Coralie Keens, MD;  Location: [place];  Service: General;  Laterality: Left;  . Portacath placement Right 08/16/2014    Procedure: INSERTION PORT-A-CATH;  Surgeon: Coralie Keens, MD;  Location: [place];  Service: General;  Laterality: Right;  . Axillary lymph node dissection Left 08/16/2014    Procedure: AXILLARY LYMPH NODE  DISSECTION;  Surgeon: Coralie Keens, MD;  Location: [place];  Service: General;  Laterality: Left;    Family History  Problem Relation Age of Onset  . Cirrhosis Father   . Colon polyps Mother   . Colon cancer Neg Hx    Social History:  reports that she has never smoked. She has never used smokeless tobacco. She reports that she drinks alcohol. She reports that she does not use illicit drugs.  Allergies:  Allergies  Allergen Reactions  . Crestor [Rosuvastatin] Other (See Comments)    myalgias  . Lipitor [Atorvastatin] Other (See Comments)    myalgias  . Temazepam Other (See Comments)    Pt does not remember    No prescriptions prior to admission    No results found for this or any previous visit (from the past 48 hour(s)). No results found.  Review of Systems  All other systems reviewed and are negative.   There were no vitals taken for this visit. Physical Exam  Constitutional: She is oriented to person, place, and time. She appears well-developed and well-nourished. No distress.  HENT:  Head: Normocephalic and atraumatic.  Eyes: Conjunctivae are normal. Pupils are equal, round, and reactive to light.  Neck: Normal range of motion. No tracheal deviation present.  Cardiovascular: Normal rate, regular rhythm, normal heart sounds and intact distal pulses.   Respiratory: Effort normal and breath sounds normal.  GI: Soft. Bowel sounds are normal. There is no tenderness.  Musculoskeletal: Normal range of motion.  Neurological: She is alert and oriented to person, place, and time.  Skin: Skin is warm. No erythema.  Psychiatric: Her behavior is normal. Judgment normal.     Assessment/Plan History of left breast cancer status post completion of therapy  As the Port-A-Cath is no longer needed, we will proceed with removal in the operating room. I discussed the risk of surgery with her in detail.  She understands and wishes to proceed.  Surgery is  scheduled  Mikele Sifuentes A 03/07/2015, 4:23 PM

## 2015-03-08 ENCOUNTER — Encounter (HOSPITAL_COMMUNITY)
Admission: RE | Disposition: A | Discharge status: Home / Self Care | Payer: Self-pay | Source: Ambulatory Visit | Attending: Surgery

## 2015-03-08 ENCOUNTER — Ambulatory Visit (HOSPITAL_COMMUNITY): Payer: PRIVATE HEALTH INSURANCE | Admitting: Anesthesiology

## 2015-03-08 ENCOUNTER — Ambulatory Visit
Admission: RE | Admit: 2015-03-08 | Discharge: 2015-03-08 | Disposition: A | Discharge status: Home / Self Care | Payer: PRIVATE HEALTH INSURANCE | Source: Ambulatory Visit | Attending: Radiation Oncology | Admitting: Radiation Oncology

## 2015-03-08 ENCOUNTER — Ambulatory Visit (HOSPITAL_COMMUNITY)
Admission: RE | Admit: 2015-03-08 | Discharge: 2015-03-08 | Disposition: A | Discharge status: Home / Self Care | Payer: PRIVATE HEALTH INSURANCE | Source: Ambulatory Visit | Attending: Surgery | Admitting: Surgery

## 2015-03-08 ENCOUNTER — Encounter (HOSPITAL_COMMUNITY): Payer: Self-pay | Admitting: *Deleted

## 2015-03-08 DIAGNOSIS — Z9221 Personal history of antineoplastic chemotherapy: Secondary | ICD-10-CM | POA: Diagnosis not present

## 2015-03-08 DIAGNOSIS — Z853 Personal history of malignant neoplasm of breast: Secondary | ICD-10-CM | POA: Diagnosis not present

## 2015-03-08 DIAGNOSIS — E785 Hyperlipidemia, unspecified: Secondary | ICD-10-CM | POA: Insufficient documentation

## 2015-03-08 DIAGNOSIS — Z510 Encounter for antineoplastic radiation therapy: Secondary | ICD-10-CM | POA: Diagnosis not present

## 2015-03-08 DIAGNOSIS — Z452 Encounter for adjustment and management of vascular access device: Secondary | ICD-10-CM | POA: Diagnosis present

## 2015-03-08 HISTORY — PX: PORT-A-CATH REMOVAL: SHX5289

## 2015-03-08 SURGERY — REMOVAL PORT-A-CATH
Anesthesia: Monitor Anesthesia Care

## 2015-03-08 MED ORDER — ONDANSETRON HCL 4 MG/2ML IJ SOLN
INTRAMUSCULAR | Status: AC
  Filled 2015-03-08: qty 2

## 2015-03-08 MED ORDER — GLYCOPYRROLATE 0.2 MG/ML IJ SOLN
INTRAMUSCULAR | Status: AC
  Filled 2015-03-08: qty 1

## 2015-03-08 MED ORDER — FENTANYL CITRATE (PF) 100 MCG/2ML IJ SOLN
INTRAMUSCULAR | Status: DC | PRN
  Administered 2015-03-08 (×2): 50 ug via INTRAVENOUS

## 2015-03-08 MED ORDER — PROPOFOL 500 MG/50ML IV EMUL
INTRAVENOUS | Status: DC | PRN
  Administered 2015-03-08: 150 ug/kg/min via INTRAVENOUS

## 2015-03-08 MED ORDER — BUPIVACAINE HCL (PF) 0.5 % IJ SOLN
INTRAMUSCULAR | Status: AC
  Filled 2015-03-08: qty 60

## 2015-03-08 MED ORDER — PROPOFOL 10 MG/ML IV BOLUS
INTRAVENOUS | Status: AC
  Filled 2015-03-08: qty 60

## 2015-03-08 MED ORDER — LIDOCAINE HCL 1 % IJ SOLN
INTRAMUSCULAR | Status: AC
  Filled 2015-03-08: qty 20

## 2015-03-08 MED ORDER — 0.9 % SODIUM CHLORIDE (POUR BTL) OPTIME
TOPICAL | Status: DC | PRN
  Administered 2015-03-08: 1000 mL

## 2015-03-08 MED ORDER — FENTANYL CITRATE (PF) 100 MCG/2ML IJ SOLN
INTRAMUSCULAR | Status: AC
  Filled 2015-03-08: qty 2

## 2015-03-08 MED ORDER — MIDAZOLAM HCL 5 MG/5ML IJ SOLN
INTRAMUSCULAR | Status: DC | PRN
  Administered 2015-03-08: 2 mg via INTRAVENOUS

## 2015-03-08 MED ORDER — LIDOCAINE HCL 1 % IJ SOLN
INTRAMUSCULAR | Status: DC | PRN
  Administered 2015-03-08: 10 mL

## 2015-03-08 MED ORDER — CEFAZOLIN SODIUM-DEXTROSE 2-3 GM-% IV SOLR
2.0000 g | INTRAVENOUS | Status: AC
  Administered 2015-03-08: 2 g via INTRAVENOUS

## 2015-03-08 MED ORDER — MIDAZOLAM HCL 2 MG/2ML IJ SOLN
INTRAMUSCULAR | Status: AC
  Filled 2015-03-08: qty 2

## 2015-03-08 MED ORDER — ONDANSETRON HCL 4 MG/2ML IJ SOLN
INTRAMUSCULAR | Status: DC | PRN
  Administered 2015-03-08: 4 mg via INTRAVENOUS

## 2015-03-08 MED ORDER — LACTATED RINGERS IV SOLN: INTRAVENOUS | Status: DC

## 2015-03-08 MED ORDER — CEFAZOLIN SODIUM-DEXTROSE 2-3 GM-% IV SOLR
INTRAVENOUS | Status: AC
  Filled 2015-03-08: qty 50

## 2015-03-08 MED ORDER — HYDROCODONE-ACETAMINOPHEN 5-325 MG PO TABS: 1.0000 | ORAL_TABLET | Freq: Four times a day (QID) | ORAL | Status: DC | PRN

## 2015-03-08 MED ORDER — LACTATED RINGERS IV SOLN
INTRAVENOUS | Status: DC | PRN
  Administered 2015-03-08: 07:00:00 via INTRAVENOUS

## 2015-03-08 SURGICAL SUPPLY — 32 items
BANDAGE ADH SHEER 1  50/CT (GAUZE/BANDAGES/DRESSINGS) IMPLANT
BENZOIN TINCTURE PRP APPL 2/3 (GAUZE/BANDAGES/DRESSINGS) ×2 IMPLANT
BLADE SURG 15 STRL LF DISP TIS (BLADE) ×1 IMPLANT
BLADE SURG 15 STRL SS (BLADE) ×1
CHLORAPREP W/TINT 26ML (MISCELLANEOUS) ×2 IMPLANT
COVER SURGICAL LIGHT HANDLE (MISCELLANEOUS) ×2 IMPLANT
DECANTER SPIKE VIAL GLASS SM (MISCELLANEOUS) ×2 IMPLANT
DRAPE LAPAROTOMY TRNSV 102X78 (DRAPE) ×2 IMPLANT
ELECT CAUTERY BLADE 6.4 (BLADE) ×2 IMPLANT
ELECT PENCIL ROCKER SW 15FT (MISCELLANEOUS) ×2 IMPLANT
ELECT REM PT RETURN 9FT ADLT (ELECTROSURGICAL) ×2
ELECTRODE REM PT RTRN 9FT ADLT (ELECTROSURGICAL) ×1 IMPLANT
GAUZE SPONGE 2X2 8PLY STRL LF (GAUZE/BANDAGES/DRESSINGS) ×1 IMPLANT
GLOVE SURG SIGNA 7.5 PF LTX (GLOVE) ×2 IMPLANT
GOWN STRL REUS W/TWL XL LVL3 (GOWN DISPOSABLE) ×4 IMPLANT
KIT BASIN OR (CUSTOM PROCEDURE TRAY) ×2 IMPLANT
LIQUID BAND (GAUZE/BANDAGES/DRESSINGS) ×2 IMPLANT
NEEDLE HYPO 22GX1.5 SAFETY (NEEDLE) ×2 IMPLANT
NEEDLE HYPO 25X1 1.5 SAFETY (NEEDLE) ×2 IMPLANT
PACK BASIC VI WITH GOWN DISP (CUSTOM PROCEDURE TRAY) ×2 IMPLANT
SOL PREP POV-IOD 4OZ 10% (MISCELLANEOUS) IMPLANT
SPONGE GAUZE 2X2 STER 10/PKG (GAUZE/BANDAGES/DRESSINGS) ×1
SPONGE LAP 4X18 X RAY DECT (DISPOSABLE) ×2 IMPLANT
STRIP CLOSURE SKIN 1/2X4 (GAUZE/BANDAGES/DRESSINGS) IMPLANT
SUT MNCRL AB 4-0 PS2 18 (SUTURE) ×2 IMPLANT
SUT VIC AB 3-0 SH 27 (SUTURE) ×1
SUT VIC AB 3-0 SH 27XBRD (SUTURE) ×1 IMPLANT
SYR 20CC LL (SYRINGE) ×2 IMPLANT
TAPE CLOTH 1X10 TAN NS (GAUZE/BANDAGES/DRESSINGS) ×2 IMPLANT
TOWEL OR 17X26 10 PK STRL BLUE (TOWEL DISPOSABLE) ×2 IMPLANT
TOWEL OR NON WOVEN STRL DISP B (DISPOSABLE) ×2 IMPLANT
YANKAUER SUCT BULB TIP 10FT TU (MISCELLANEOUS) IMPLANT

## 2015-03-08 NOTE — Interval H&P Note (Signed)
History and Physical Interval Note: no change in H and P  03/08/2015 6:48 AM  [name]  has presented today for surgery, with the diagnosis of history of breast cancer, port no longer needed   The various methods of treatment have been discussed with the patient and family. After consideration of risks, benefits and other options for treatment, the patient has consented to  Procedure(s): REMOVAL PORT-A-CATH (N/A) as a surgical intervention .  The patient's history has been reviewed, patient examined, no change in status, stable for surgery.  I have reviewed the patient's chart and labs.  Questions were answered to the patient's satisfaction.     Deliliah Spranger A

## 2015-03-08 NOTE — Progress Notes (Signed)
Spoke with Melissa in [place] patient to come to cancer center and check in upstairs when ready.

## 2015-03-08 NOTE — Op Note (Signed)
REMOVAL PORT-Miller-CATH  Procedure Note  [name] 03/08/2015   Pre-op Diagnosis: history of breast cancer, port no longer needed      Post-op Diagnosis: same  Procedure(s): REMOVAL PORT-Miller-CATH  Surgeon(s): Coralie Keens, MD  Anesthesia: Monitor Anesthesia Care  Staff:  Circulator: Rickard Rhymes, RN; Fayrene Helper, RN Scrub Person: Joesphine Bare, RN  Estimated Blood Loss: Minimal                         [name]   Date: 03/08/2015  Time: 7:54 AM

## 2015-03-08 NOTE — Discharge Instructions (Signed)
Ok to shower starting tomorrow. Leave dressing on x 24 hours keep clean and dry. Then may remove and shower as usual.Call doctor for any fever 100.1 or greater, pain not relieved by medication, any unusual bleeding. No driving x 24 hours. No change in diet or activity.  Call if any swelling or redness at operative site.

## 2015-03-08 NOTE — Progress Notes (Signed)
Area at left upper chest open and draining small amount. Radiations

## 2015-03-08 NOTE — Op Note (Signed)
[name], [name] NO.:  [id number]  MEDICAL RECORD NO.:  [id number]  LOCATION:  [facility]                         FACILITY:  [facility]  PHYSICIAN:  Coralie Keens, M.D. DATE OF BIRTH:  [birth year]  DATE OF PROCEDURE:  03/08/2015 DATE OF DISCHARGE:                              OPERATIVE REPORT   PREOPERATIVE DIAGNOSIS:  History of breast cancer with Port-A-Cath, no longer in use.  POSTOPERATIVE DIAGNOSIS:  History of breast cancer with Port-A-Cath, no longer in use.  PROCEDURE:  Removal of right subclavian Port-A-Cath.  SURGEON:  Coralie Keens, M.D.  ANESTHESIA:  1% lidocaine with monitored anesthesia care.  ESTIMATED BLOOD LOSS:  Minimal.  INDICATIONS:  This is a 54 year old female, who is completing treatment for a left breast cancer.  The Port-A-Cath for IV chemotherapy is no longer being used as she has completed therapy, so decision has been made to proceed with Port-A-Cath removal.  PROCEDURE IN DETAIL:  The patient was brought to the operating room, identified as [name].  She was placed supine on the operating room table and anesthesia was induced.  Her right chest was prepped and draped in usual sterile fashion.  I anesthetized the previous scar with 1% lidocaine.  I then made incision with scalpel took this down to the port.  The port was then easily excised from its original pocket and the previous placed sutures were removed.  I was then able to easily slide the port and entire catheter intact out of the pocket and vein.  I then closed the vein tract with 3-0 Vicryl suture.  I then closed subcutaneous tissue with interrupted 3-0 Vicryl sutures and closed the skin with a running 4-0 Monocryl.  Skin glue was then applied.  The patient tolerated the procedure well.  All the counts were correct at the end of procedure.  The patient was then taken in stable condition from the operating room to recovery room.     Coralie Keens,  M.D.     DB/MEDQ  D:  03/08/2015  T:  03/08/2015  Job:  [id number]

## 2015-03-08 NOTE — Transfer of Care (Signed)
Immediate Anesthesia Transfer of Care Note  Patient: [name]  Procedure(s) Performed: Procedure(s): REMOVAL PORT-A-CATH (N/A)  Patient Location: PACU  Anesthesia Type:MAC  Level of Consciousness:  sedated, patient cooperative and responds to stimulation  Airway & Oxygen Therapy:Patient Spontanous Breathing and Patient connected to face mask oxgen  Post-op Assessment:  Report given to PACU RN and Post -op Vital signs reviewed and stable  Post vital signs:  Reviewed and stable  Last Vitals:  Filed Vitals:   03/08/15 0616  BP: 126/77  Pulse: 80  Temp: 36.6 C  Resp: 16    Complications: No apparent anesthesia complications

## 2015-03-08 NOTE — Anesthesia Postprocedure Evaluation (Signed)
Anesthesia Post Note  Patient: [name]  Procedure(s) Performed: Procedure(s) (LRB): REMOVAL PORT-A-CATH (N/A)  Patient location during evaluation: PACU Anesthesia Type: MAC Level of consciousness: awake and alert Pain management: pain level controlled Vital Signs Assessment: post-procedure vital signs reviewed and stable Respiratory status: spontaneous breathing, nonlabored ventilation, respiratory function stable and patient connected to nasal cannula oxygen Cardiovascular status: stable and blood pressure returned to baseline Anesthetic complications: no    Last Vitals:  Filed Vitals:   03/08/15 0823 03/08/15 0900  BP: 112/59 119/77  Pulse: 83 82  Temp: 36.4 C   Resp: 16 16    Last Pain:  Filed Vitals:   03/08/15 0908  PainSc: [place]

## 2015-03-09 ENCOUNTER — Ambulatory Visit
Admission: RE | Admit: 2015-03-09 | Discharge: 2015-03-09 | Disposition: A | Discharge status: Home / Self Care | Payer: PRIVATE HEALTH INSURANCE | Source: Ambulatory Visit | Attending: Radiation Oncology | Admitting: Radiation Oncology

## 2015-03-09 ENCOUNTER — Encounter: Payer: Self-pay | Admitting: Radiation Oncology

## 2015-03-09 VITALS — BP 119/80 | HR 85 | Temp 98.2°F | Ht 65.0 in | Wt 147.4 lb

## 2015-03-09 DIAGNOSIS — C50412 Malignant neoplasm of upper-outer quadrant of left female breast: Secondary | ICD-10-CM

## 2015-03-09 DIAGNOSIS — Z510 Encounter for antineoplastic radiation therapy: Secondary | ICD-10-CM | POA: Diagnosis not present

## 2015-03-09 NOTE — Progress Notes (Signed)
[name] has received 27 fractions to her breast.  Note desquamation in the [facility] region which is covered with a hypafix dressing and grades as a level 5/01 for pain..  Using neosporin and also lidocaine.  Erythema of breast and note dry peel in the inframmary fold.

## 2015-03-09 NOTE — Progress Notes (Signed)
  Radiation Oncology         [phone]     Name: [name] MRN: [id number]   Date: 03/09/2015  DOB: 12/01/1960   Weekly Radiation Therapy Management    ICD-9-CM ICD-10-CM   1. Breast cancer of upper-outer quadrant of left female breast (HCC) 174.4 C50.412     Current Dose: 48.6 Gy  Planned Dose:  60.4 Gy  Narrative The patient presents for routine under treatment assessment.  Ms. Markwardt has received 27 fractions to her breast. Note desquamation in the [facility] region which is covered with a hypafix dressing and grades as a level 5/10 for pain. Using neosporin and also lidocaine. Erythema of breast and note dry peel in the inframmary fold. She only takes Tylenol for the pain.  The patient is without complaint. Set-up films were reviewed. The chart was checked.  Physical Findings  height is '5\' 5"'$  (1.651 m) and weight is 147 lb 6.4 oz (66.86 kg). Her temperature is 98.2 F (36.8 C). Her blood pressure is 119/80 and her pulse is 85. . Weight essentially stable.  No significant changes. Left breast is smaller than the right. No palpable seroma. The left breast is mildly erythematous with no desquamation.   Impression The patient is tolerating radiation.  Plan Continue treatment as planned. We discussed the importance of keeping the irritated skin moisturized, especially after treatment, with lotion or aloe vera.          Sheral Apley Tammi Klippel, M.D.  This document serves as a record of services personally performed by Tyler Pita, MD. It was created on his behalf by Lendon Collar, a trained medical scribe. The creation of this record is based on the scribe's personal observations and the provider's statements to them. This document has been checked and approved by the attending provider.

## 2015-03-11 DIAGNOSIS — Z170 Estrogen receptor positive status [ER+]: Secondary | ICD-10-CM | POA: Insufficient documentation

## 2015-03-11 DIAGNOSIS — C50412 Malignant neoplasm of upper-outer quadrant of left female breast: Secondary | ICD-10-CM | POA: Insufficient documentation

## 2015-03-11 DIAGNOSIS — Z9221 Personal history of antineoplastic chemotherapy: Secondary | ICD-10-CM | POA: Insufficient documentation

## 2015-03-11 DIAGNOSIS — Z510 Encounter for antineoplastic radiation therapy: Secondary | ICD-10-CM | POA: Insufficient documentation

## 2015-03-13 ENCOUNTER — Ambulatory Visit
Admission: RE | Admit: 2015-03-13 | Discharge: 2015-03-13 | Disposition: A | Discharge status: Home / Self Care | Payer: PRIVATE HEALTH INSURANCE | Source: Ambulatory Visit | Attending: Radiation Oncology | Admitting: Radiation Oncology

## 2015-03-13 DIAGNOSIS — Z9221 Personal history of antineoplastic chemotherapy: Secondary | ICD-10-CM | POA: Diagnosis not present

## 2015-03-13 DIAGNOSIS — Z510 Encounter for antineoplastic radiation therapy: Secondary | ICD-10-CM | POA: Diagnosis not present

## 2015-03-13 DIAGNOSIS — Z170 Estrogen receptor positive status [ER+]: Secondary | ICD-10-CM | POA: Diagnosis not present

## 2015-03-13 DIAGNOSIS — C50412 Malignant neoplasm of upper-outer quadrant of left female breast: Secondary | ICD-10-CM | POA: Diagnosis present

## 2015-03-14 ENCOUNTER — Ambulatory Visit
Admission: RE | Admit: 2015-03-14 | Discharge: 2015-03-14 | Disposition: A | Discharge status: Home / Self Care | Payer: PRIVATE HEALTH INSURANCE | Source: Ambulatory Visit | Attending: Radiation Oncology | Admitting: Radiation Oncology

## 2015-03-14 DIAGNOSIS — Z510 Encounter for antineoplastic radiation therapy: Secondary | ICD-10-CM | POA: Diagnosis not present

## 2015-03-15 ENCOUNTER — Ambulatory Visit
Admission: RE | Admit: 2015-03-15 | Discharge: 2015-03-15 | Disposition: A | Discharge status: Home / Self Care | Payer: PRIVATE HEALTH INSURANCE | Source: Ambulatory Visit | Attending: Radiation Oncology | Admitting: Radiation Oncology

## 2015-03-15 DIAGNOSIS — Z510 Encounter for antineoplastic radiation therapy: Secondary | ICD-10-CM | POA: Diagnosis not present

## 2015-03-16 ENCOUNTER — Ambulatory Visit
Admission: RE | Admit: 2015-03-16 | Discharge: 2015-03-16 | Disposition: A | Discharge status: Home / Self Care | Payer: PRIVATE HEALTH INSURANCE | Source: Ambulatory Visit | Attending: Radiation Oncology | Admitting: Radiation Oncology

## 2015-03-16 VITALS — BP 110/72 | HR 86 | Temp 97.8°F | Resp 16 | Ht 65.0 in | Wt 145.6 lb

## 2015-03-16 DIAGNOSIS — C50412 Malignant neoplasm of upper-outer quadrant of left female breast: Secondary | ICD-10-CM

## 2015-03-16 DIAGNOSIS — Z510 Encounter for antineoplastic radiation therapy: Secondary | ICD-10-CM | POA: Diagnosis not present

## 2015-03-16 NOTE — Progress Notes (Addendum)
[name] has completed 31 fractions to her left breast.  She denies having pain or fatigue.  The skin on her left subclavian area is red and open.  She reports she has some yellow drainage.  She is using neosporin ointment on this area.  The skin on her left breast is red with hyperpigmentation.  She is using neoporin cream on this area.  Patient was given more telfa pads and a one month follow up appointment.  BP 110/72 mmHg  Pulse 86  Temp(Src) 97.8 F (36.6 C) (Oral)  Resp 16  Ht '5\' 5"'$  (1.651 m)  Wt 145 lb 9.6 oz (66.044 kg)  BMI 24.23 kg/m2

## 2015-03-16 NOTE — Progress Notes (Signed)
Department of Radiation Oncology  Phone:  [phone] Fax:        [phone]  Weekly Treatment Note    Name: [name] Date: 03/16/2015 MRN: [id number] DOB: 1960/09/05   Diagnosis:     ICD-9-CM ICD-10-CM   1. Breast cancer of upper-outer quadrant of left female breast ([place]) 174.4 C50.412      Current dose: 56.4 Gy  Current fraction: 31   MEDICATIONS: Current Outpatient Prescriptions  Medication Sig Dispense Refill  . acetaminophen (TYLENOL) 325 MG tablet Take 975 mg by mouth as directed. Usually takes twice daily in morning and bedtime and additionally during the day if needed for pain    . clonazePAM (KLONOPIN) 0.5 MG tablet TAKE 1 TABLET BY MOUTH 3 TIMES A DAY AS NEEDED (Patient taking differently: TAKE 1 TABLET BY MOUTH 3 TIMES A DAY AS NEEDED FOR MENARDS DISEASE) 90 tablet 1  . hydroxypropyl methylcellulose / hypromellose (ISOPTO TEARS / GONIOVISC) 2.5 % ophthalmic solution Place 1 drop into both eyes 3 (three) times daily as needed for dry eyes.    . magic mouthwash w/lidocaine SOLN Take 5 mLs by mouth 4 (four) times daily as needed for mouth pain. 90 mL 0  . pantoprazole (PROTONIX) 20 MG tablet Take 20 mg by mouth daily.    Marland Kitchen PARoxetine (PAXIL-CR) 25 MG 24 hr tablet TAKE 1 TABLET BY MOUTH EVERY DAY (Patient taking differently: TAKE 1 TABLET BY MOUTH EVERY DAY AT BEDTIME) 90 tablet 0  . hyaluronate sodium (RADIAPLEXRX) GEL Apply 1 application topically 2 (two) times daily. Reported on 03/16/2015    . HYDROcodone-acetaminophen (NORCO) 5-325 MG tablet Take 1-2 tablets by mouth every 6 (six) hours as needed for moderate pain. (Patient not taking: Reported on 03/16/2015) 20 tablet 0  . non-metallic deodorant (ALRA) MISC Apply 1 application topically daily as needed (RADIATION BURNS). Reported on 03/16/2015     No current facility-administered medications for this encounter.     ALLERGIES: Crestor; Lipitor; and Temazepam   LABORATORY DATA:  Lab Results  Component  Value Date   WBC 2.3* 03/01/2015   HGB 12.6 03/01/2015   HCT 36.6 03/01/2015   MCV 98.7 03/01/2015   PLT 136* 03/01/2015   Lab Results  Component Value Date   NA 140 01/15/2015   K 3.9 01/15/2015   CL 99* 09/09/2014   CO2 24 01/15/2015   Lab Results  Component Value Date   ALT 33 01/15/2015   AST 36* 01/15/2015   ALKPHOS 79 01/15/2015   BILITOT 0.49 01/15/2015     NARRATIVE: [name] was seen today for weekly treatment management. The chart was checked and the patient's films were reviewed.  [name] has completed 31 fractions to her left breast.  She denies having pain or fatigue.  The skin on her left subclavian area is red and open.  She reports she has some yellow drainage.  She is using neosporin ointment on this area.  The skin on her left breast is red with hyperpigmentation.  She is using neoporin cream on this area.  Patient was given more telfa pads and a one month follow up appointment.  BP 110/72 mmHg  Pulse 86  Temp(Src) 97.8 F (36.6 C) (Oral)  Resp 16  Ht '5\' 5"'$  (1.651 m)  Wt 145 lb 9.6 oz (66.044 kg)  BMI 24.23 kg/m2  PHYSICAL EXAMINATION: height is '5\' 5"'$  (1.651 m) and weight is 145 lb 9.6 oz (66.044 kg). Her oral temperature is 97.8 F (  36.6 C). Her blood pressure is 110/72 and her pulse is 86. Her respiration is 16.      the patient has a area of significant irritation/moist desquamation in the left supraclavicular region. According to the patient, this has begun to heal slightly since she began the boost treatment. Diffuse hyperpigmentation present elsewhere within the left breast region without any moist desquamation elsewhere.  ASSESSMENT: The patient is doing satisfactorily with treatment. I believe that the area in the left supraclavicular region will continue to heal. This should substantially improved within the next couple of weeks. I do not anticipate a similar degree of irritation in the boost area which looks good at this  point.  PLAN: We will continue with the patient's radiation treatment as planned. The patient will follow-up in our clinic in 1 month.

## 2015-03-19 ENCOUNTER — Ambulatory Visit
Admission: RE | Admit: 2015-03-19 | Discharge: 2015-03-19 | Disposition: A | Discharge status: Home / Self Care | Payer: PRIVATE HEALTH INSURANCE | Source: Ambulatory Visit | Attending: Radiation Oncology | Admitting: Radiation Oncology

## 2015-03-19 DIAGNOSIS — Z510 Encounter for antineoplastic radiation therapy: Secondary | ICD-10-CM | POA: Diagnosis not present

## 2015-03-20 ENCOUNTER — Encounter: Payer: Self-pay | Admitting: Radiation Oncology

## 2015-03-20 ENCOUNTER — Encounter: Payer: Self-pay | Admitting: *Deleted

## 2015-03-20 ENCOUNTER — Other Ambulatory Visit: Payer: Self-pay | Admitting: *Deleted

## 2015-03-20 ENCOUNTER — Ambulatory Visit
Admission: RE | Admit: 2015-03-20 | Discharge: 2015-03-20 | Disposition: A | Discharge status: Home / Self Care | Payer: PRIVATE HEALTH INSURANCE | Source: Ambulatory Visit | Attending: Radiation Oncology | Admitting: Radiation Oncology

## 2015-03-20 DIAGNOSIS — C50412 Malignant neoplasm of upper-outer quadrant of left female breast: Secondary | ICD-10-CM

## 2015-03-20 DIAGNOSIS — Z510 Encounter for antineoplastic radiation therapy: Secondary | ICD-10-CM | POA: Diagnosis not present

## 2015-03-22 ENCOUNTER — Other Ambulatory Visit: Payer: PRIVATE HEALTH INSURANCE

## 2015-03-26 ENCOUNTER — Other Ambulatory Visit: Payer: Self-pay | Admitting: Adult Health

## 2015-03-26 DIAGNOSIS — C50412 Malignant neoplasm of upper-outer quadrant of left female breast: Secondary | ICD-10-CM

## 2015-03-28 ENCOUNTER — Telehealth: Payer: Self-pay | Admitting: Nurse Practitioner

## 2015-03-28 NOTE — Telephone Encounter (Signed)
Left message for patient re March SCP visit. Patient to get new schedule at 1/24 f/u w/[facility]G - also confirmed.

## 2015-03-30 ENCOUNTER — Other Ambulatory Visit (HOSPITAL_BASED_OUTPATIENT_CLINIC_OR_DEPARTMENT_OTHER): Payer: PRIVATE HEALTH INSURANCE

## 2015-03-30 DIAGNOSIS — C50412 Malignant neoplasm of upper-outer quadrant of left female breast: Secondary | ICD-10-CM

## 2015-03-30 LAB — COMPREHENSIVE METABOLIC PANEL
ALK PHOS: 69 U/L (ref 40–150)
ALT: 20 U/L (ref 0–55)
AST: 23 U/L (ref 5–34)
Albumin: 4 g/dL (ref 3.5–5.0)
Anion Gap: 8 mEq/L (ref 3–11)
BILIRUBIN TOTAL: 0.78 mg/dL (ref 0.20–1.20)
BUN: 10.5 mg/dL (ref 7.0–26.0)
CO2: 26 meq/L (ref 22–29)
CREATININE: 0.7 mg/dL (ref 0.6–1.1)
Calcium: 9.3 mg/dL (ref 8.4–10.4)
Chloride: 105 mEq/L (ref 98–109)
EGFR: >90 mL/min/{1.73_m2} (ref >90)
GLUCOSE: 90 mg/dL (ref 70–140)
Potassium: 3.9 mEq/L (ref 3.5–5.1)
SODIUM: 139 meq/L (ref 136–145)
TOTAL PROTEIN: 8 g/dL (ref 6.4–8.3)

## 2015-03-30 LAB — CBC WITH DIFFERENTIAL/PLATELET
BASO%: 0.5 % (ref 0.0–2.0)
Basophils Absolute: 0 10*3/uL (ref 0.0–0.1)
EOS%: 1.9 % (ref 0.0–7.0)
Eosinophils Absolute: 0 10*3/uL (ref 0.0–0.5)
HCT: 39.8 % (ref 34.8–46.6)
HEMOGLOBIN: 13.4 g/dL (ref 11.6–15.9)
LYMPH%: 40.6 % (ref 14.0–49.7)
MCH: 33.2 pg (ref 25.1–34.0)
MCHC: 33.7 g/dL (ref 31.5–36.0)
MCV: 98.5 fL (ref 79.5–101.0)
MONO#: 0.4 10*3/uL (ref 0.1–0.9)
MONO%: 17.4 % — AB (ref 0.0–14.0)
NEUT%: 39.6 % (ref 38.4–76.8)
NEUTROS ABS: 1 10*3/uL — AB (ref 1.5–6.5)
Platelets: 135 10*3/uL — ABNORMAL LOW (ref 145–400)
RBC: 4.04 10*6/uL (ref 3.70–5.45)
RDW: 13.2 % (ref 11.2–14.5)
WBC: 2.6 10*3/uL — AB (ref 3.9–10.3)
lymph#: 1 10*3/uL (ref 0.9–3.3)

## 2015-03-31 LAB — FOLLICLE STIMULATING HORMONE: FSH: 85.7 m[IU]/mL

## 2015-04-02 NOTE — Assessment & Plan Note (Signed)
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 Adjuvant chemotherapy with dose dense Adriamycin and Cytoxan x 4 followed by Abraxane weekly 12 because of lymph node positive disease. Started 09/04/14 to 01/15/15 2. adjuvant radiation completed 03/20/15 3. Followed by antiestrogen therapy -------------------------------------------------------------------------------------------------------------------------- We discussed the risks and benefits of anti-estrogen therapy with aromatase inhibitors. These include but not limited to insomnia, hot flashes, mood changes, vaginal dryness, bone density loss, and weight gain. Although rare, serious side effects including endometrial cancer, risk of blood clots were also discussed. We strongly believe that the benefits far outweigh the risks. Patient understands these risks and consented to starting treatment. Planned treatment duration is 5 years.

## 2015-04-03 ENCOUNTER — Telehealth: Payer: Self-pay | Admitting: Hematology and Oncology

## 2015-04-03 ENCOUNTER — Encounter: Payer: Self-pay | Admitting: Hematology and Oncology

## 2015-04-03 ENCOUNTER — Ambulatory Visit (HOSPITAL_BASED_OUTPATIENT_CLINIC_OR_DEPARTMENT_OTHER): Payer: PRIVATE HEALTH INSURANCE | Admitting: Hematology and Oncology

## 2015-04-03 ENCOUNTER — Encounter: Payer: Self-pay | Admitting: *Deleted

## 2015-04-03 VITALS — BP 114/69 | HR 88 | Temp 98.1°F | Resp 18 | Ht 65.0 in | Wt 146.5 lb

## 2015-04-03 DIAGNOSIS — Z170 Estrogen receptor positive status [ER+]: Secondary | ICD-10-CM | POA: Diagnosis not present

## 2015-04-03 DIAGNOSIS — C50412 Malignant neoplasm of upper-outer quadrant of left female breast: Secondary | ICD-10-CM

## 2015-04-03 NOTE — Progress Notes (Signed)
Unable to get into exam room prior to MD.  Meds and allergies reconciled from printed pt med list provided by MD.  No assessment performed.

## 2015-04-03 NOTE — Telephone Encounter (Signed)
Aware of 5 week follow up appointment

## 2015-04-03 NOTE — Addendum Note (Signed)
Addended by: Prentiss Bells on: 04/03/2015 12:16 PM   Modules accepted: Orders, Medications

## 2015-04-03 NOTE — Progress Notes (Signed)
Patient Care Team: Chipper Herb, MD as PCP - General (Family Medicine)  SUMMARY OF ONCOLOGIC HISTORY:   Breast cancer of upper-outer quadrant of left female breast ([place])   07/18/2014 Mammogram Distortion left breast, breast density category C; U/S 1.3 x 0.8 x 0.8 cm left breast mass at 2:00 position 4 cm from nipple, no lymph nodes   07/20/2014 Initial Diagnosis Left breast Biopsy: Invasive lobular cancer with LCIS, Grade 1, ER 86%, PR 78%, Her 2 Neg Ratio 1.77, Ki 67: 14%   08/01/2014 Breast MRI Breast MRI showed non-mass enhancement 3.9 cm, no lymph nodes   08/08/2014 Surgery Left breast lumpectomy: 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, with LCIS, medial and inferior margins positive, 3/4 lymph nodes positive T2 N1 cM0 stage IIB   08/16/2014 Surgery Left breast medial margin reexcision residual ILC 0.2 cm; inferior margin residual foci less than 0.2 cm, 1/5 lymph nodes positive, 1 lymph node with isolated tumor cells (Overall 4/10)   09/04/2014 - 01/15/2015 Chemotherapy Adjuvant chemotherapy with dose dense Adriamycin and Cytoxan 4 followed by Abraxane weekly 8 ( stopped early for profound neutropenia and thrombocytopenia)   01/29/2015 - 03/20/2015 Radiation Therapy Adjuvant XRT    CHIEF COMPLIANT: patient is here for follow-up after completion of adjuvant radiation  INTERVAL HISTORY: [name] is a 55 year old with above-mentioned history of left breast cancer who had lumpectomy followed by adjuvant chemotherapy and completed adjuvant radiation therapy. She is here today to discuss the next step in her treatment plan which would be with antiestrogen therapy.  REVIEW OF SYSTEMS:   Constitutional: Denies fevers, chills or abnormal weight loss Eyes: Denies blurriness of vision Ears, nose, mouth, throat, and face: Denies mucositis or sore throat Respiratory: Denies cough, dyspnea or wheezes Cardiovascular: Denies palpitation, chest discomfort Gastrointestinal:  Denies nausea,  heartburn or change in bowel habits Skin: Denies abnormal skin rashes Lymphatics: Denies new lymphadenopathy or easy bruising Neurological:Denies numbness, tingling or new weaknesses Behavioral/Psych: Mood is stable, no new changes  Extremities: No lower extremity edema Breast: skin changes related to recent radiation All other systems were reviewed with the patient and are negative.  I have reviewed the past medical history, past surgical history, social history and family history with the patient and they are unchanged from previous note.  ALLERGIES:  is allergic to crestor; lipitor; and temazepam.  MEDICATIONS:  Current Outpatient Prescriptions  Medication Sig Dispense Refill  . acetaminophen (TYLENOL) 325 MG tablet Take 975 mg by mouth as directed. Usually takes twice daily in morning and bedtime and additionally during the day if needed for pain    . clonazePAM (KLONOPIN) 0.5 MG tablet TAKE 1 TABLET BY MOUTH 3 TIMES A DAY AS NEEDED (Patient taking differently: TAKE 1 TABLET BY MOUTH 3 TIMES A DAY AS NEEDED FOR MENARDS DISEASE) 90 tablet 1  . hyaluronate sodium (RADIAPLEXRX) GEL Apply 1 application topically 2 (two) times daily. Reported on 03/16/2015    . HYDROcodone-acetaminophen (NORCO) 5-325 MG tablet Take 1-2 tablets by mouth every 6 (six) hours as needed for moderate pain. (Patient not taking: Reported on 03/16/2015) 20 tablet 0  . hydroxypropyl methylcellulose / hypromellose (ISOPTO TEARS / GONIOVISC) 2.5 % ophthalmic solution Place 1 drop into both eyes 3 (three) times daily as needed for dry eyes.    . magic mouthwash w/lidocaine SOLN Take 5 mLs by mouth 4 (four) times daily as needed for mouth pain. 90 mL 0  . non-metallic deodorant (ALRA) MISC Apply 1 application topically daily  as needed (RADIATION BURNS). Reported on 03/16/2015    . pantoprazole (PROTONIX) 20 MG tablet Take 20 mg by mouth daily.    Marland Kitchen PARoxetine (PAXIL-CR) 25 MG 24 hr tablet TAKE 1 TABLET BY MOUTH EVERY DAY (Patient  taking differently: TAKE 1 TABLET BY MOUTH EVERY DAY AT BEDTIME) 90 tablet 0   No current facility-administered medications for this visit.    PHYSICAL EXAMINATION: ECOG PERFORMANCE STATUS: 0 - Asymptomatic  Filed Vitals:   04/03/15 0833  BP: 114/69  Pulse: 88  Temp: 98.1 F (36.7 C)  Resp: 18   Filed Weights   04/03/15 0833  Weight: 146 lb 8 oz (66.452 kg)    GENERAL:alert, no distress and comfortable SKIN: skin color, texture, turgor are normal, no rashes or significant lesions EYES: normal, Conjunctiva are pink and non-injected, sclera clear OROPHARYNX:no exudate, no erythema and lips, buccal mucosa, and tongue normal  NECK: supple, thyroid normal size, non-tender, without nodularity LYMPH:  no palpable lymphadenopathy in the cervical, axillary or inguinal LUNGS: clear to auscultation and percussion with normal breathing effort HEART: regular rate & rhythm and no murmurs and no lower extremity edema ABDOMEN:abdomen soft, non-tender and normal bowel sounds MUSCULOSKELETAL:no cyanosis of digits and no clubbing  NEURO: alert & oriented x 3 with fluent speech, no focal motor/sensory deficits EXTREMITIES: No lower extremity edema  LABORATORY DATA:  I have reviewed the data as listed   Chemistry      Component Value Date/Time   NA 139 03/30/2015 1037   NA 132* 09/09/2014 2336   NA 141 07/21/2013 1023   K 3.9 03/30/2015 1037   K 3.6 09/09/2014 2336   CL 99* 09/09/2014 2336   CO2 26 03/30/2015 1037   CO2 25 09/09/2014 2336   BUN 10.5 03/30/2015 1037   BUN 15 09/09/2014 2336   BUN 14 07/21/2013 1023   CREATININE 0.7 03/30/2015 1037   CREATININE 0.43* 09/09/2014 2336   CREATININE 0.54 10/04/2012 1123      Component Value Date/Time   CALCIUM 9.3 03/30/2015 1037   CALCIUM 8.2* 09/09/2014 2336   ALKPHOS 69 03/30/2015 1037   ALKPHOS 70 09/09/2014 2336   AST 23 03/30/2015 1037   AST 19 09/09/2014 2336   ALT 20 03/30/2015 1037   ALT 26 09/09/2014 2336   BILITOT 0.78  03/30/2015 1037   BILITOT 0.8 09/09/2014 2336       Lab Results  Component Value Date   WBC 2.6* 03/30/2015   HGB 13.4 03/30/2015   HCT 39.8 03/30/2015   MCV 98.5 03/30/2015   PLT 135* 03/30/2015   NEUTROABS 1.0* 03/30/2015   ASSESSMENT & PLAN:  Breast cancer of upper-outer quadrant of left female breast Left breast lumpectomy 08/08/2014: 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 Adjuvant chemotherapy with dose dense Adriamycin and Cytoxan x 4 followed by Abraxane weekly 12 because of lymph node positive disease. Started 09/04/14 to 01/15/15 2. adjuvant radiation completed 03/20/15 3. Followed by antiestrogen therapy -------------------------------------------------------------------------------------------------------------------------- Aromatase inhibitor counseling:We discussed the risks and benefits of anti-estrogen therapy with aromatase inhibitors. These include but not limited to insomnia, hot flashes, mood changes, vaginal dryness, bone density loss,  and weight gain. Although rare, serious side effects including endometrial cancer, risk of blood clots were also discussed. We strongly believe that the benefits far outweigh the risks. Patient understands these risks and consented to starting treatment. Planned treatment duration is 5 years. We are waiting for estradiol levels to determine if she is truly menopausal.  I also offered participation in [place] clinical trial PALLAS clinical trial counseling: Patients who have completed definitive therapy for breast cancer are randomized to antiestrogen therapy (5+ years)  versus antiestrogen therapy plus Palbociclib (2 years).  Palbociclib: If she was randomized to Palbociclib, I discussed the risks and benefits of Ibrance including myelosuppression especially neutropenia and with that risk of infection, there is risk of pulmonary embolism and mild peripheral neuropathy as well. Fatigue, nausea, diarrhea, decreased appetite as well as alopecia and thrombocytopenia are also potential side effects of Palbociclib.  Back pain and bone pain: We'll obtain a PET CT scan for restaging.  Return to clinic in one month to assess tolerability due to antiestrogen therapy And then subsequently sign her up with PALLAS clinical trial.  Orders Placed This Encounter  Procedures  . NM PET Image Initial (PI) Skull Base To Thigh    Standing Status: Future     Number of Occurrences:      Standing Expiration Date: 04/02/2016    Order Specific Question:  Reason for Exam (SYMPTOM  OR DIAGNOSIS REQUIRED)    Answer:  Bone pain. Breast cancer. evaluation for metastatic disease    Order Specific Question:  Is the patient pregnant?    Answer:  No    Order Specific Question:  Preferred imaging location?    Answer:  [facility]    Order Specific Question:  If indicated for the ordered procedure, I authorize the administration of a radiopharmaceutical per Radiology protocol    Answer:  Yes   The patient has a good understanding of the overall plan. she agrees with it. she will call with any problems that may develop before the next visit here.   Rulon Eisenmenger, MD 04/03/2015

## 2015-04-04 ENCOUNTER — Encounter: Payer: Self-pay | Admitting: Hematology and Oncology

## 2015-04-04 NOTE — Progress Notes (Signed)
I faxed notes/labs to [facility] [phone]

## 2015-04-04 NOTE — Progress Notes (Signed)
I emailed Meredith form from [facility]--for dr. Lisbeth Renshaw.

## 2015-04-05 LAB — ESTRADIOL, ULTRA SENS: ESTRADIOL, SENSITIVE: 4.7 pg/mL

## 2015-04-06 ENCOUNTER — Other Ambulatory Visit: Payer: Self-pay | Admitting: *Deleted

## 2015-04-06 DIAGNOSIS — C50412 Malignant neoplasm of upper-outer quadrant of left female breast: Secondary | ICD-10-CM

## 2015-04-06 MED ORDER — ANASTROZOLE 1 MG PO TABS: 1.0000 mg | ORAL_TABLET | Freq: Every day | ORAL | Status: DC

## 2015-04-07 ENCOUNTER — Other Ambulatory Visit: Payer: Self-pay | Admitting: Family Medicine

## 2015-04-09 NOTE — Telephone Encounter (Signed)
Last seen 02/06/14  B [place]

## 2015-04-09 NOTE — Progress Notes (Signed)
  Radiation Oncology         [phone] ________________________________  Name: [name] MRN: [id number]  Date: 03/20/2015  DOB: 09-May-1960  End of Treatment Note  Diagnosis:   Left-sided breast cancer     Indication for treatment:  Curative       Radiation treatment dates:   01/29/2015 through 03/20/2015  Site/dose:   The patient initially received a dose of 50.4 Gy in 28 fractions to the breast using whole-breast tangent fields. This was delivered using a 3-D conformal technique. The patient then received a boost to the seroma. This delivered an additional 10 Gy in 5 fractions using a en face electron field. The total dose was 60.4 Gy.  Narrative: The patient tolerated radiation treatment relatively well.   The patient had some expected skin irritation as she progressed during treatment. Moist desquamation was not present at the end of treatment.  Plan: The patient has completed radiation treatment. The patient will return to radiation oncology clinic for routine followup in one month. I advised the patient to call or return sooner if they have any questions or concerns related to their recovery or treatment. ________________________________  Jodelle Gross, M.D., Ph.D.

## 2015-04-11 ENCOUNTER — Ambulatory Visit: Payer: PRIVATE HEALTH INSURANCE | Admitting: Hematology and Oncology

## 2015-04-13 ENCOUNTER — Other Ambulatory Visit: Payer: Self-pay | Admitting: Family Medicine

## 2015-04-16 ENCOUNTER — Telehealth: Payer: Self-pay | Admitting: *Deleted

## 2015-04-16 ENCOUNTER — Telehealth: Payer: Self-pay | Admitting: Family Medicine

## 2015-04-16 NOTE — Telephone Encounter (Signed)
Med refill requested for Klonopin Pt NTBS Not seen since 01/2014 appt scheduled

## 2015-04-16 NOTE — Telephone Encounter (Signed)
Last seen 01/2014  B [place]   If approved route to nurse to call into [facility]

## 2015-04-16 NOTE — Telephone Encounter (Signed)
Spoke with pt regarding RX Informed pt that she has not been seen since 01/2014 and due to the new narcotic policy she would need appt Pt was upset but scheduled appt with [facility] for 04/18/2015 at [facility]

## 2015-04-17 ENCOUNTER — Ambulatory Visit (HOSPITAL_COMMUNITY)
Admission: RE | Admit: 2015-04-17 | Discharge: 2015-04-17 | Disposition: A | Discharge status: Home / Self Care | Payer: PRIVATE HEALTH INSURANCE | Source: Ambulatory Visit | Attending: Hematology and Oncology | Admitting: Hematology and Oncology

## 2015-04-17 ENCOUNTER — Other Ambulatory Visit: Payer: Self-pay | Admitting: Hematology and Oncology

## 2015-04-17 DIAGNOSIS — C50412 Malignant neoplasm of upper-outer quadrant of left female breast: Secondary | ICD-10-CM | POA: Diagnosis not present

## 2015-04-17 DIAGNOSIS — Z9889 Other specified postprocedural states: Secondary | ICD-10-CM | POA: Insufficient documentation

## 2015-04-17 DIAGNOSIS — D119 Benign neoplasm of major salivary gland, unspecified: Secondary | ICD-10-CM | POA: Insufficient documentation

## 2015-04-17 LAB — GLUCOSE, CAPILLARY: GLUCOSE-CAPILLARY: 96 mg/dL (ref 65–99)

## 2015-04-17 MED ORDER — FLUDEOXYGLUCOSE F - 18 (FDG) INJECTION
7.2000 | Freq: Once | INTRAVENOUS | Status: AC | PRN
  Administered 2015-04-17: 7.2 via INTRAVENOUS

## 2015-04-17 MED ORDER — FLUDEOXYGLUCOSE F - 18 (FDG) INJECTION: 7.2000 | Freq: Once | INTRAVENOUS | Status: DC | PRN

## 2015-04-18 ENCOUNTER — Ambulatory Visit (INDEPENDENT_AMBULATORY_CARE_PROVIDER_SITE_OTHER): Payer: PRIVATE HEALTH INSURANCE | Admitting: Family Medicine

## 2015-04-18 ENCOUNTER — Encounter: Payer: Self-pay | Admitting: Family Medicine

## 2015-04-18 VITALS — BP 116/78 | HR 85 | Temp 98.4°F | Ht 65.0 in | Wt 147.0 lb

## 2015-04-18 DIAGNOSIS — C50412 Malignant neoplasm of upper-outer quadrant of left female breast: Secondary | ICD-10-CM

## 2015-04-18 DIAGNOSIS — F4323 Adjustment disorder with mixed anxiety and depressed mood: Secondary | ICD-10-CM

## 2015-04-18 DIAGNOSIS — H8109 Meniere's disease, unspecified ear: Secondary | ICD-10-CM

## 2015-04-18 DIAGNOSIS — E785 Hyperlipidemia, unspecified: Secondary | ICD-10-CM

## 2015-04-18 DIAGNOSIS — E559 Vitamin D deficiency, unspecified: Secondary | ICD-10-CM

## 2015-04-18 DIAGNOSIS — Z0000 Encounter for general adult medical examination without abnormal findings: Secondary | ICD-10-CM

## 2015-04-18 MED ORDER — PAROXETINE HCL ER 25 MG PO TB24: 25.0000 mg | ORAL_TABLET | Freq: Every day | ORAL | Status: DC

## 2015-04-18 MED ORDER — CLONAZEPAM 0.5 MG PO TABS: 0.5000 mg | ORAL_TABLET | Freq: Three times a day (TID) | ORAL | Status: DC | PRN

## 2015-04-18 NOTE — Patient Instructions (Addendum)
Continue current medications. Continue good therapeutic lifestyle changes which include good diet and exercise. Fall precautions discussed with patient. If an FOBT was given today- please return it to our front desk. If you are over 53 years old - you may need Prevnar 15 or the adult Pneumonia vaccine.  **Flu shots are available--- please call and schedule a FLU-CLINIC appointment**  After your visit with [place] today you will receive a survey in the mail or online from [employer] regarding your care with [place]. Please take a moment to fill this out. Your feedback is very important to [place] as you can help [place] better understand your patient needs as well as improve your experience and satisfaction. WE CARE ABOUT YOU!!!   The patient will continue to follow-up regularly with her gynecologist and oncologist She will get her pelvic exam as planned She'll continue to work on her diet as much as possible and exercising as much as possible

## 2015-04-18 NOTE — Progress Notes (Signed)
Subjective:    Patient ID: [name], female    DOB: 1960/09/18, 55 y.o.   MRN: [id number]  HPI Pt here for follow up and management of chronic medical problems which includes hyperlipidemia. He is taking medications regularly. We have seen this patient in a good while. Since last seeing her she has lost her mother suddenly secondary to a fall and subdural hematoma. She is also shortly after that developed breast cancer and she just finished treatment for the breast cancer in January of this year. The new narcotic and benzodiazepine policy has been explained to the patient today. The patient is doing well overall. She is somewhat tearful today because I have not seen her since her mother passed away almost a year ago. She is also been doing with her breast cancer. Otherwise she appears to be upbeat and excepting the situation that she's had to work through and positive about her breast cancer situation. She denies any chest pain or shortness of breath. She is not having any trouble with her GI tract including trouble swallowing heartburn indigestion and nausea vomiting diarrhea or blood in the stool. She is passing her water without problems. She is due to get her gynecological exam and this is in the works. She is also due to get an eye exam and was told not to get this by her oncologist until the chemotherapy was out of her system more.       Patient Active Problem List   Diagnosis Date Noted  . 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 Headache 10/30/2014  . Chemotherapy induced thrombocytopenia 10/24/2014  . Anemia associated with chemotherapy 10/24/2014  . Fever 10/24/2014  . Neutropenic fever ([place]) 09/10/2014  . Febrile neutropenia ([place]) 09/10/2014  . Breast cancer of upper-outer quadrant of left female breast ([place]) 07/31/2014  . Hyperlipidemia 09/06/2012  . Generalized anxiety disorder 09/06/2012  . Meniere's disease 09/06/2012  . Statin intolerance 09/06/2012  . Left rotator cuff tear  09/06/2012   Outpatient Encounter Prescriptions as of 04/18/2015  Medication Sig  . acetaminophen (TYLENOL) 325 MG tablet Take 975 mg by mouth as directed. Usually takes twice daily in morning and bedtime and additionally during the day if needed for pain  . anastrozole (ARIMIDEX) 1 MG tablet Take 1 tablet (1 mg total) by mouth daily.  . clonazePAM (KLONOPIN) 0.5 MG tablet TAKE 1 TABLET BY MOUTH 3 TIMES A DAY AS NEEDED (Patient taking differently: TAKE 1 TABLET BY MOUTH 3 TIMES A DAY AS NEEDED FOR MENARDS DISEASE)  . pantoprazole (PROTONIX) 20 MG tablet Take 20 mg by mouth daily.  Marland Kitchen PARoxetine (PAXIL-CR) 25 MG 24 hr tablet TAKE 1 TABLET BY MOUTH EVERY DAY  . [DISCONTINUED] hyaluronate sodium (RADIAPLEXRX) GEL Apply 1 application topically 2 (two) times daily. Reported on 03/16/2015  . [DISCONTINUED] hydroxypropyl methylcellulose / hypromellose (ISOPTO TEARS / GONIOVISC) 2.5 % ophthalmic solution Place 1 drop into both eyes 3 (three) times daily as needed for dry eyes.   Facility-Administered Encounter Medications as of 04/18/2015  Medication  . fludeoxyglucose F - 18 (FDG) injection 7.2 milli [place]      Review of Systems  Constitutional: Negative.   HENT: Negative.   Eyes: Negative.   Respiratory: Negative.   Cardiovascular: Negative.   Gastrointestinal: Negative.   Endocrine: Negative.   Genitourinary: Negative.   Musculoskeletal: Negative.   Skin: Negative.   Allergic/Immunologic: Negative.   Neurological: Negative.   Hematological: Negative.   Psychiatric/Behavioral: Negative.  Objective:   Physical Exam  Constitutional: She is oriented to person, place, and time. She appears well-developed and well-nourished. She appears distressed.  HENT:  Head: Normocephalic and atraumatic.  Right Ear: External ear normal.  Left Ear: External ear normal.  Nose: Nose normal.  Mouth/Throat: Oropharynx is clear and moist.  Eyes: Conjunctivae and EOM are normal. Pupils are equal, round,  and reactive to light. Right eye exhibits no discharge. Left eye exhibits no discharge. No scleral icterus.  Neck: Normal range of motion. Neck supple. No thyromegaly present.  Cardiovascular: Normal rate, regular rhythm, normal heart sounds and intact distal pulses.   No murmur heard. Pulmonary/Chest: Effort normal and breath sounds normal. No respiratory distress. She has no wheezes. She has no rales. She exhibits no tenderness.  Clear anteriorly and posteriorly  Abdominal: Soft. Bowel sounds are normal. She exhibits no mass. There is no tenderness. There is no rebound and no guarding.  Nontender without organ enlargement or masses or bruits or inguinal adenopathy  Musculoskeletal: Normal range of motion. She exhibits no edema or tenderness.  Lymphadenopathy:    She has no cervical adenopathy.  Neurological: She is alert and oriented to person, place, and time. She has normal reflexes. No cranial nerve deficit.  Skin: Skin is warm and dry. No rash noted.  Psychiatric: She has a normal mood and affect. Her behavior is normal. Judgment and thought content normal.  Nursing note and vitals reviewed.  BP 116/78 mmHg  Pulse 85  Temp(Src) 98.4 F (36.9 C) (Oral)  Ht '5\' 5"'$  (1.651 m)  Wt 147 lb (66.679 kg)  BMI 24.46 kg/m2        Assessment & Plan:  1. Hyperlipidemia -For now, continue with as aggressive therapeutic lifestyle changes as possible - Lipid panel  2. Meniere's disease, unspecified laterality -She is having no symptoms of dizziness today.  3. Vitamin D deficiency -Continue with current treatment pending results of lab work - VITAMIN D 25 Hydroxy (Vit-D Deficiency, Fractures)  4. Health care maintenance -Get pelvic exam and get eye exam and return an FOBT  5. Adjustment disorder with mixed anxiety and depressed mood -Continue with clonazepam and Paxil as doing.  6. Malignant neoplasm of upper-outer quadrant of left female breast ([place]) -Continue to follow-up with  hematology /oncology and radiology  Meds ordered this encounter  Medications  . clonazePAM (KLONOPIN) 0.5 MG tablet    Sig: Take 1 tablet (0.5 mg total) by mouth 3 (three) times daily as needed.    Dispense:  90 tablet    Refill:  5    Not to exceed 5 additional fills before 06/10/2015.  Marland Kitchen PARoxetine (PAXIL-CR) 25 MG 24 hr tablet    Sig: Take 1 tablet (25 mg total) by mouth daily.    Dispense:  30 tablet    Refill:  11   Patient Instructions  Continue current medications. Continue good therapeutic lifestyle changes which include good diet and exercise. Fall precautions discussed with patient. If an FOBT was given today- please return it to our front desk. If you are over 75 years old - you may need Prevnar 54 or the adult Pneumonia vaccine.  **Flu shots are available--- please call and schedule a FLU-CLINIC appointment**  After your visit with [place] today you will receive a survey in the mail or online from [employer] regarding your care with [place]. Please take a moment to fill this out. Your feedback is very important to [place] as you can help [place] better understand  your patient needs as well as improve your experience and satisfaction. WE CARE ABOUT YOU!!!   The patient will continue to follow-up regularly with her gynecologist and oncologist She will get her pelvic exam as planned She'll continue to work on her diet as much as possible and exercising as much as possible   Arrie Senate MD

## 2015-04-19 ENCOUNTER — Other Ambulatory Visit: Payer: Self-pay | Admitting: *Deleted

## 2015-04-19 LAB — VITAMIN D 25 HYDROXY (VIT D DEFICIENCY, FRACTURES): Vit D, 25-Hydroxy: 23.2 ng/mL — ABNORMAL LOW (ref 30.0–100.0)

## 2015-04-19 LAB — LIPID PANEL
CHOLESTEROL TOTAL: 183 mg/dL (ref 100–199)
Chol/HDL Ratio: 3.6 ratio units (ref 0.0–4.4)
HDL: 51 mg/dL (ref >39)
LDL Calculated: 99 mg/dL (ref 0–99)
TRIGLYCERIDES: 165 mg/dL — AB (ref 0–149)
VLDL CHOLESTEROL CAL: 33 mg/dL (ref 5–40)

## 2015-04-19 MED ORDER — VITAMIN D (ERGOCALCIFEROL) 1.25 MG (50000 UNIT) PO CAPS: 50000.0000 [IU] | ORAL_CAPSULE | ORAL | Status: DC

## 2015-04-20 ENCOUNTER — Telehealth: Payer: Self-pay | Admitting: *Deleted

## 2015-04-20 ENCOUNTER — Ambulatory Visit
Admission: RE | Admit: 2015-04-20 | Payer: PRIVATE HEALTH INSURANCE | Source: Ambulatory Visit | Admitting: Radiation Oncology

## 2015-04-20 NOTE — Telephone Encounter (Signed)
Called asnd spoke with patient, she didn't remember her 1100 am  Follow up appt, rescheduled for 04/25/15 at 69 am,apolpgys to MD and staff stated she didn't get a remindr call 12:35 PM

## 2015-04-24 ENCOUNTER — Ambulatory Visit (HOSPITAL_COMMUNITY)
Admission: RE | Admit: 2015-04-24 | Discharge: 2015-04-24 | Disposition: A | Discharge status: Home / Self Care | Payer: PRIVATE HEALTH INSURANCE | Source: Ambulatory Visit | Attending: Hematology and Oncology | Admitting: Hematology and Oncology

## 2015-04-24 DIAGNOSIS — D119 Benign neoplasm of major salivary gland, unspecified: Secondary | ICD-10-CM | POA: Insufficient documentation

## 2015-04-24 DIAGNOSIS — K119 Disease of salivary gland, unspecified: Secondary | ICD-10-CM | POA: Insufficient documentation

## 2015-04-24 DIAGNOSIS — Z853 Personal history of malignant neoplasm of breast: Secondary | ICD-10-CM | POA: Diagnosis not present

## 2015-04-24 MED ORDER — GADOBENATE DIMEGLUMINE 529 MG/ML IV SOLN
15.0000 mL | Freq: Once | INTRAVENOUS | Status: AC | PRN
  Administered 2015-04-24: 13 mL via INTRAVENOUS

## 2015-04-25 ENCOUNTER — Ambulatory Visit: Payer: PRIVATE HEALTH INSURANCE | Admitting: Radiation Oncology

## 2015-04-25 ENCOUNTER — Other Ambulatory Visit: Payer: Self-pay | Admitting: Hematology and Oncology

## 2015-04-25 DIAGNOSIS — D119 Benign neoplasm of major salivary gland, unspecified: Secondary | ICD-10-CM

## 2015-05-02 ENCOUNTER — Encounter: Payer: Self-pay | Admitting: Radiation Oncology

## 2015-05-02 ENCOUNTER — Other Ambulatory Visit: Payer: Self-pay | Admitting: Otolaryngology

## 2015-05-02 ENCOUNTER — Ambulatory Visit
Admission: RE | Admit: 2015-05-02 | Discharge: 2015-05-02 | Disposition: A | Discharge status: Home / Self Care | Payer: PRIVATE HEALTH INSURANCE | Source: Ambulatory Visit | Attending: Radiation Oncology | Admitting: Radiation Oncology

## 2015-05-02 VITALS — BP 106/76 | HR 81 | Temp 97.6°F | Resp 16 | Ht 65.5 in | Wt 148.8 lb

## 2015-05-02 DIAGNOSIS — C50412 Malignant neoplasm of upper-outer quadrant of left female breast: Secondary | ICD-10-CM

## 2015-05-02 NOTE — Progress Notes (Signed)
Radiation Oncology         [phone] ________________________________  Name: [name] MRN: [id number]  Date: 05/02/2015  DOB: 23-Jan-1961  Follow-Up Visit Note  CC: Redge Gainer, MD  Nicholas Lose, MD  Diagnosis:  Left-sided breast cancer  Interval Since Last Radiation:   01/29/2015 through 03/20/2015; 1 month  Site/dose:   The patient initially received a dose of 50.4 Gy in 28 fractions to the breast using whole-breast tangent fields. This was delivered using a 3-D conformal technique. The patient then received a boost to the seroma. This delivered an additional 10 Gy in 5 fractions using a en face electron field. The total dose was 60.4 Gy.  Narrative:  The patient returns today for routine follow-up. She reports that she feels great today. She reports a very good energy level and good appetite. She does report dryness around the nipple. She denies pain.             ALLERGIES:  is allergic to crestor; lipitor; and temazepam.  Meds: Current Outpatient Prescriptions  Medication Sig Dispense Refill  . acetaminophen (TYLENOL) 325 MG tablet Take 975 mg by mouth as directed. Usually takes twice daily in morning and bedtime and additionally during the day if needed for pain    . anastrozole (ARIMIDEX) 1 MG tablet Take 1 tablet (1 mg total) by mouth daily. 30 tablet 12  . clonazePAM (KLONOPIN) 0.5 MG tablet Take 1 tablet (0.5 mg total) by mouth 3 (three) times daily as needed. 90 tablet 5  . pantoprazole (PROTONIX) 20 MG tablet Take 20 mg by mouth daily.    Marland Kitchen PARoxetine (PAXIL-CR) 25 MG 24 hr tablet Take 1 tablet (25 mg total) by mouth daily. 30 tablet 11  . Vitamin D, Ergocalciferol, (DRISDOL) 50000 units CAPS capsule Take 1 capsule (50,000 Units total) by mouth every 7 (seven) days. 12 capsule 0   No current facility-administered medications for this encounter.    Physical Findings:  height is 5' 5.5" (1.664 m) and weight is 148 lb 12.8 oz (67.495 kg). Her oral temperature  is 97.6 F (36.4 C). Her blood pressure is 106/76 and her pulse is 81. Her respiration is 16.    The patient is in no acute distress. Patient is alert and oriented. On exam, breast appears to be healing well.   Lab Findings: Lab Results  Component Value Date   WBC 2.6* 03/30/2015   WBC 2.3* 03/01/2015   WBC 5.5 07/21/2013   HGB 13.4 03/30/2015   HGB 12.6 03/01/2015   HGB 12.9 07/21/2013   HCT 39.8 03/30/2015   HCT 36.6 03/01/2015   HCT 40.4 07/21/2013   PLT 135* 03/30/2015   PLT 136* 03/01/2015    Lab Results  Component Value Date   NA 139 03/30/2015   NA 132* 09/09/2014   NA 141 07/21/2013   K 3.9 03/30/2015   K 3.6 09/09/2014   CHLORIDE 105 03/30/2015   CO2 26 03/30/2015   CO2 25 09/09/2014   GLUCOSE 90 03/30/2015   GLUCOSE 96 09/09/2014   GLUCOSE 86 07/21/2013   BUN 10.5 03/30/2015   BUN 15 09/09/2014   BUN 14 07/21/2013   CREATININE 0.7 03/30/2015   CREATININE 0.43* 09/09/2014   CREATININE 0.54 10/04/2012   BILITOT 0.78 03/30/2015   BILITOT 0.8 09/09/2014   ALKPHOS 69 03/30/2015   ALKPHOS 70 09/09/2014   AST 23 03/30/2015   AST 19 09/09/2014   ALT 20 03/30/2015   ALT 26 09/09/2014  PROT 8.0 03/30/2015   PROT 6.2* 09/09/2014   PROT 6.5 07/21/2013   ALBUMIN 4.0 03/30/2015   ALBUMIN 3.5 09/09/2014   ALBUMIN 4.3 07/21/2013   CALCIUM 9.3 03/30/2015   CALCIUM 8.2* 09/09/2014   ANIONGAP 8 03/30/2015   ANIONGAP 8 09/09/2014    Radiographic Findings: Mr Neck Soft Tissue Only W Wo Contrast  04/25/2015  CLINICAL DATA:  History of left breast cancer status post lumpectomy and axillary lymph node dissection. Small parotid lesions on PET-CT. EXAM: MR NECK SOFT TISSUE ONLY WITHOUT AND WITH CONTRAST TECHNIQUE: Multiplanar, multisequence MR imaging of the neck was performed both before and after administration of intravenous contrast. CONTRAST:  33m MULTIHANCE GADOBENATE DIMEGLUMINE 529 MG/ML IV SOLN COMPARISON:  PET-CT 04/17/2015 FINDINGS: Axial and sagittal T1  postcontrast images are moderately motion degraded. There is a 12 x 10 mm enhancing soft tissue nodule near the tail of the left parotid corresponding to the lesion on PET-CT. It is not completely clear whether this lesion is separate from and abutting the deep aspect of the parotid tail or if it is projecting inferiorly from the parotid. There is a 7 x 5 mm nodule with similar signal characteristics in the inferior aspect of the right parotid gland. Both parotid glands demonstrate baseline diffuse heterogeneity with tiny nodular foci of T1 hypointensity throughout. No significant surrounding inflammatory change is seen. The submandibular glands are unremarkable. No pharyngeal mass or laryngeal mass is seen. An 8 mm T2 hyperintense nodule is noted in the right thyroid lobe without significantly elevated FDG uptake on the recent PET-CT. No enlarged lymph nodes are identified elsewhere in the neck. Bilateral level II lymph nodes measure 6 mm in short axis. The visualized portion of the brain is unremarkable. Orbits are unremarkable. A small right maxillary sinus mucous retention cyst is noted. The mastoid air cells are clear. Mild cervical spondylosis is noted. IMPRESSION: 1. 7 mm right parotid nodule and 12 mm left parotid tail versus periparotid nodule. Considerations include intraparotid/periparotid lymph nodes and small primary parotid neoplasm (such as benign mixed tumor or Warthin's tumor, although malignancy cannot be excluded by imaging alone). 2. Mild background heterogeneity/ micronodularity of both parotid glands. This could reflect the sequelae of chronic inflammation, an autoimmune process such as Sjogren's disease, or lymphoepithelial lesions from HIV. Electronically Signed   By: ALogan BoresM.D.   On: 04/25/2015 09:23   Nm Pet Image Initial (pi) Skull Base To Thigh  04/17/2015  CLINICAL DATA:  Initial treatment strategy for left breast cancer. Bone pain. EXAM: NUCLEAR MEDICINE PET SKULL BASE TO THIGH  TECHNIQUE: 7.2 mCi F-18 FDG was injected intravenously. Full-ring PET imaging was performed from the skull base to thigh after the radiotracer. CT data was obtained and used for attenuation correction and anatomic localization. FASTING BLOOD GLUCOSE:  Value: 96 new mg/dl COMPARISON:  Whole-body bone scan dated 12/20/2014. CT chest abdomen pelvis dated 08/14/2014. FINDINGS: NECK 8 x 6 mm nodule in the right parotid gland (series 4/ image 20), max SUV 5.1. 11 x 9 mm nodule in the inferior left parotid gland (series 4/ image 23) versus a left level IIa lymph node, max SUV 9.3. Overall, this appearance favors bilateral Warthin's gland tumors, but is indeterminate. CHEST Lungs are clear. No suspicious pulmonary nodules. No pleural effusion or pneumothorax. The heart is normal in size.  No pericardial effusion. Postsurgical changes related to left breast lumpectomy in the upper/outer left breast. No hypermetabolic thoracic lymphadenopathy. Status post left axillary lymph node dissection. ABDOMEN/PELVIS  No abnormal hypermetabolic activity within the liver, pancreas, adrenal glands, or spleen. Cholelithiasis (series 4/image 123), without associated inflammatory changes. No hypermetabolic lymph nodes in the abdomen or pelvis. SKELETON No focal hypermetabolic activity to suggest skeletal metastasis. IMPRESSION: Status post left breast lumpectomy and left axillary lymph node dissection. No findings specific for metastatic disease. Suspected small bilateral parotid lesions, left level IIa cervical lymph node not excluded. Overall, this appearance favors bilateral Warthin's gland tumors. Consider CT neck with contrast or MR face with/without contrast for further characterization as clinically warranted. Electronically Signed   By: Julian Hy M.D.   On: 04/17/2015 09:38    Impression:  The patient is recovering from the effects of radiation.    Plan:  We will release the patient and see her back if needed.    ________________________________   Jodelle Gross, MD, PhD     This document serves as a record of services personally performed by Shona Simpson, PA and Kyung Rudd, MD. It was created on their behalf by Jenell Milliner, a trained medical scribe. The creation of this record is based on the scribe's personal observations and the provider's statements to them. This document has been checked and approved by the attending provider.

## 2015-05-02 NOTE — Progress Notes (Addendum)
Follow up s/p left breast radiation 01/29/15-03/20/15, well healed, dryness at nipple , no pain, appetite good, energy good, will have  Right breast reduction  After the summer stated, has survivorship appt 05/24/15 with Chestine Spore, NP 10:59 AM' BP 106/76 mmHg  Pulse 81  Temp(Src) 97.6 F (36.4 C) (Oral)  Resp 16  Ht 5' 5.5" (1.664 m)  Wt 148 lb 12.8 oz (67.495 kg)  BMI 24.38 kg/m2  Wt Readings from Last 3 Encounters:  05/02/15 148 lb 12.8 oz (67.495 kg)  04/18/15 147 lb (66.679 kg)  04/03/15 146 lb 8 oz (66.452 kg)  Takes Arimidex '1mg'$  oral daily, she gets hiccups for a few minutes after taking, then resolves, GFollow up Dr. Lindi Adie 05/07/15, Pet scan results good stated patient, hoping to get in Clinical trial [place] Thyroid scan done a couple nodules found  , will do biopsy per Dr. Erik Obey, waiting on call,

## 2015-05-03 ENCOUNTER — Telehealth: Payer: Self-pay

## 2015-05-03 ENCOUNTER — Encounter: Payer: Self-pay | Admitting: Radiation Oncology

## 2015-05-03 NOTE — Telephone Encounter (Signed)
Pt was seen by Dr. Erik Obey on 0/98.  No follow up appt has been scheduled at this time.  Office notes requested.

## 2015-05-04 ENCOUNTER — Telehealth: Payer: Self-pay

## 2015-05-04 NOTE — Telephone Encounter (Signed)
I requested pt provide Release of Information to [facility] so they will send records from her visit with Dr. Mare Loan on 5/61/53.  Pt stated she will take care of it today.  Pt reports Dr. Erik Obey did not feel the area to be of any concern, but he is willing to biopsy for peace of mind and 100% surety.  Pt is still waiting for biopsy to be scheduled.

## 2015-05-06 NOTE — Assessment & Plan Note (Signed)
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 Adjuvant chemotherapy with dose dense Adriamycin and Cytoxan x 4 followed by Abraxane weekly 12 because of lymph node positive disease. Started 09/04/14 to 01/15/15 2. adjuvant radiation completed 03/20/15 3. Followed by antiestrogen therapy started 04/03/15 -------------------------------------------------------------------------------------------------------------------------- Anastrozole toxicities:  [facility] Trial   Parotid nodules: ENT considers them benign. They are planning a biopsy at our request.

## 2015-05-07 ENCOUNTER — Ambulatory Visit (HOSPITAL_BASED_OUTPATIENT_CLINIC_OR_DEPARTMENT_OTHER): Payer: PRIVATE HEALTH INSURANCE | Admitting: Hematology and Oncology

## 2015-05-07 ENCOUNTER — Encounter: Payer: Self-pay | Admitting: Hematology and Oncology

## 2015-05-07 ENCOUNTER — Encounter: Payer: Self-pay | Admitting: *Deleted

## 2015-05-07 VITALS — BP 112/66 | HR 83 | Temp 98.1°F | Resp 18 | Ht 65.5 in | Wt 151.1 lb

## 2015-05-07 DIAGNOSIS — Z79811 Long term (current) use of aromatase inhibitors: Secondary | ICD-10-CM

## 2015-05-07 DIAGNOSIS — C50412 Malignant neoplasm of upper-outer quadrant of left female breast: Secondary | ICD-10-CM | POA: Diagnosis not present

## 2015-05-07 DIAGNOSIS — Z170 Estrogen receptor positive status [ER+]: Secondary | ICD-10-CM

## 2015-05-07 NOTE — Progress Notes (Signed)
Patient Care Team: Chipper Herb, MD as PCP - General (Family Medicine)  SUMMARY OF ONCOLOGIC HISTORY:   Breast cancer of upper-outer quadrant of left female breast ([place])   07/18/2014 Mammogram Distortion left breast, breast density category C; U/S 1.3 x 0.8 x 0.8 cm left breast mass at 2:00 position 4 cm from nipple, no lymph nodes   07/20/2014 Initial Diagnosis Left breast Biopsy: Invasive lobular cancer with LCIS, Grade 1, ER 86%, PR 78%, Her 2 Neg Ratio 1.77, Ki 67: 14%   08/01/2014 Breast MRI Breast MRI showed non-mass enhancement 3.9 cm, no lymph nodes   08/08/2014 Surgery Left breast lumpectomy: 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, with LCIS, medial and inferior margins positive, 3/4 lymph nodes positive T2 N1 cM0 stage IIB   08/16/2014 Surgery Left breast medial margin reexcision residual ILC 0.2 cm; inferior margin residual foci less than 0.2 cm, 1/5 lymph nodes positive, 1 lymph node with isolated tumor cells (Overall 4/10)   09/04/2014 - 01/15/2015 Chemotherapy Adjuvant chemotherapy with dose dense Adriamycin and Cytoxan 4 followed by Abraxane weekly 8 ( stopped early for profound neutropenia and thrombocytopenia)   01/29/2015 - 03/20/2015 Radiation Therapy Adjuvant XRT   04/11/2015 -  Anti-estrogen oral therapy Anastrozole 1 mg daily    CHIEF COMPLIANT: Follow-up on anastrozole  INTERVAL HISTORY: [name] is a 55 year old with above-mentioned history of left breast cancer currently on adjuvant antiestrogen therapy with anastrozole. She is tolerating it extremely well. She denies any hot flashes or myalgias. Her only concern today is weight gain. She would really like to lose the extra weight that she had gained during chemotherapy. She does not have any residual side effects from chemotherapy. Her case appetite energy levels and strength are back to normal. She is working very hard with her current employment and she is looking forward to a couple of vacations that she has  planned.  REVIEW OF SYSTEMS:   Constitutional: Denies fevers, chills or abnormal weight loss Eyes: Denies blurriness of vision Ears, nose, mouth, throat, and face: Denies mucositis or sore throat Respiratory: Denies cough, dyspnea or wheezes Cardiovascular: Denies palpitation, chest discomfort Gastrointestinal:  Denies nausea, heartburn or change in bowel habits Skin: Denies abnormal skin rashes Lymphatics: Denies new lymphadenopathy or easy bruising Neurological:Denies numbness, tingling or new weaknesses Behavioral/Psych: Mood is stable, no new changes  Extremities: No lower extremity edema Breast:  denies any pain or lumps or nodules in either breasts All other systems were reviewed with the patient and are negative.  I have reviewed the past medical history, past surgical history, social history and family history with the patient and they are unchanged from previous note.  ALLERGIES:  is allergic to crestor; lipitor; and temazepam.  MEDICATIONS:  Current Outpatient Prescriptions  Medication Sig Dispense Refill  . acetaminophen (TYLENOL) 325 MG tablet Take 975 mg by mouth as directed. Usually takes twice daily in morning and bedtime and additionally during the day if needed for pain    . anastrozole (ARIMIDEX) 1 MG tablet Take 1 tablet (1 mg total) by mouth daily. 30 tablet 12  . clonazePAM (KLONOPIN) 0.5 MG tablet Take 1 tablet (0.5 mg total) by mouth 3 (three) times daily as needed. 90 tablet 5  . pantoprazole (PROTONIX) 20 MG tablet Take 20 mg by mouth daily.    Marland Kitchen PARoxetine (PAXIL-CR) 25 MG 24 hr tablet Take 1 tablet (25 mg total) by mouth daily. 30 tablet 11  . Vitamin D, Ergocalciferol, (DRISDOL) 50000 units  CAPS capsule Take 1 capsule (50,000 Units total) by mouth every 7 (seven) days. 12 capsule 0   No current facility-administered medications for this visit.    PHYSICAL EXAMINATION: ECOG PERFORMANCE STATUS: 0 - Asymptomatic  Filed Vitals:   05/07/15 1020  BP:  112/66  Pulse: 83  Temp: 98.1 F (36.7 C)  Resp: 18   Filed Weights   05/07/15 1020  Weight: 151 lb 1.6 oz (68.539 kg)    GENERAL:alert, no distress and comfortable SKIN: skin color, texture, turgor are normal, no rashes or significant lesions EYES: normal, Conjunctiva are pink and non-injected, sclera clear OROPHARYNX:no exudate, no erythema and lips, buccal mucosa, and tongue normal  NECK: supple, thyroid normal size, non-tender, without nodularity LYMPH:  no palpable lymphadenopathy in the cervical, axillary or inguinal LUNGS: clear to auscultation and percussion with normal breathing effort HEART: regular rate & rhythm and no murmurs and no lower extremity edema ABDOMEN:abdomen soft, non-tender and normal bowel sounds MUSCULOSKELETAL:no cyanosis of digits and no clubbing  NEURO: alert & oriented x 3 with fluent speech, no focal motor/sensory deficits EXTREMITIES: No lower extremity edema  LABORATORY DATA:  I have reviewed the data as listed   Chemistry      Component Value Date/Time   NA 139 03/30/2015 1037   NA 132* 09/09/2014 2336   NA 141 07/21/2013 1023   K 3.9 03/30/2015 1037   K 3.6 09/09/2014 2336   CL 99* 09/09/2014 2336   CO2 26 03/30/2015 1037   CO2 25 09/09/2014 2336   BUN 10.5 03/30/2015 1037   BUN 15 09/09/2014 2336   BUN 14 07/21/2013 1023   CREATININE 0.7 03/30/2015 1037   CREATININE 0.43* 09/09/2014 2336   CREATININE 0.54 10/04/2012 1123      Component Value Date/Time   CALCIUM 9.3 03/30/2015 1037   CALCIUM 8.2* 09/09/2014 2336   ALKPHOS 69 03/30/2015 1037   ALKPHOS 70 09/09/2014 2336   AST 23 03/30/2015 1037   AST 19 09/09/2014 2336   ALT 20 03/30/2015 1037   ALT 26 09/09/2014 2336   BILITOT 0.78 03/30/2015 1037   BILITOT 0.8 09/09/2014 2336      Lab Results  Component Value Date   WBC 2.6* 03/30/2015   HGB 13.4 03/30/2015   HCT 39.8 03/30/2015   MCV 98.5 03/30/2015   PLT 135* 03/30/2015   NEUTROABS 1.0* 03/30/2015   ASSESSMENT &  PLAN:  Breast cancer of upper-outer quadrant of left female breast Left breast lumpectomy 08/08/2014: 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 Adjuvant chemotherapy with dose dense Adriamycin and Cytoxan x 4 followed by Abraxane weekly 12 because of lymph node positive disease. Started 09/04/14 to 01/15/15 2. adjuvant radiation completed 03/20/15 3. Followed by antiestrogen therapy started 04/03/15 -------------------------------------------------------------------------------------------------------------------------- Anastrozole toxicities: Patient denies any hot flashes or myalgias.  PALLAS Trial : As we know the results of the parotid lymph node biopsy, we plan to enroll her on the clinical trial hopefully next month. She is also inquiring about the weight loss study that we have ongoing. Parotid nodules: ENT considers them benign. They are planning a biopsy at our request.  Return to clinic  based upon clinical trial participation. No orders of the defined types were placed in this encounter.   The patient has a good understanding of the overall plan. she agrees with it. she will call with any problems that may develop before the next visit here.   Rulon Eisenmenger, MD 05/07/2015     I left a little this elevated

## 2015-05-08 ENCOUNTER — Other Ambulatory Visit (HOSPITAL_COMMUNITY): Payer: Self-pay | Admitting: Otolaryngology

## 2015-05-08 DIAGNOSIS — K119 Disease of salivary gland, unspecified: Secondary | ICD-10-CM

## 2015-05-08 DIAGNOSIS — K118 Other diseases of salivary glands: Secondary | ICD-10-CM

## 2015-05-09 ENCOUNTER — Encounter: Payer: Self-pay | Admitting: *Deleted

## 2015-05-09 NOTE — Progress Notes (Signed)
Office notes received from [facility], reviewed by Dr. Lindi Adie, sent to scan.

## 2015-05-11 ENCOUNTER — Other Ambulatory Visit: Payer: Self-pay | Admitting: Radiology

## 2015-05-14 ENCOUNTER — Other Ambulatory Visit (HOSPITAL_COMMUNITY): Payer: Self-pay | Admitting: Otolaryngology

## 2015-05-14 ENCOUNTER — Ambulatory Visit (HOSPITAL_COMMUNITY)
Admission: RE | Admit: 2015-05-14 | Discharge: 2015-05-14 | Disposition: A | Discharge status: Home / Self Care | Payer: PRIVATE HEALTH INSURANCE | Source: Ambulatory Visit | Attending: Otolaryngology | Admitting: Otolaryngology

## 2015-05-14 DIAGNOSIS — K118 Other diseases of salivary glands: Secondary | ICD-10-CM | POA: Insufficient documentation

## 2015-05-14 DIAGNOSIS — R220 Localized swelling, mass and lump, head: Secondary | ICD-10-CM | POA: Insufficient documentation

## 2015-05-14 DIAGNOSIS — K119 Disease of salivary gland, unspecified: Secondary | ICD-10-CM

## 2015-05-14 LAB — CBC
HEMATOCRIT: 38.5 % (ref 36.0–46.0)
HEMOGLOBIN: 12.9 g/dL (ref 12.0–15.0)
MCH: 32.6 pg (ref 26.0–34.0)
MCHC: 33.5 g/dL (ref 30.0–36.0)
MCV: 97.2 fL (ref 78.0–100.0)
Platelets: 128 10*3/uL — ABNORMAL LOW (ref 150–400)
RBC: 3.96 MIL/uL (ref 3.87–5.11)
RDW: 12.5 % (ref 11.5–15.5)
WBC: 4.1 10*3/uL (ref 4.0–10.5)

## 2015-05-14 LAB — PROTIME-INR
INR: 0.99 (ref 0.00–1.49)
PROTHROMBIN TIME: 13.3 s (ref 11.6–15.2)

## 2015-05-14 LAB — APTT: aPTT: 27 seconds (ref 24–37)

## 2015-05-14 MED ORDER — LIDOCAINE HCL (PF) 1 % IJ SOLN
INTRAMUSCULAR | Status: AC
  Filled 2015-05-14: qty 10

## 2015-05-14 MED ORDER — SODIUM CHLORIDE 0.9 % IV SOLN: Freq: Once | INTRAVENOUS | Status: DC

## 2015-05-14 MED ORDER — FENTANYL CITRATE (PF) 100 MCG/2ML IJ SOLN
INTRAMUSCULAR | Status: DC
Start: 2015-05-14 — End: 2015-05-14
  Filled 2015-05-14: qty 2

## 2015-05-14 MED ORDER — MIDAZOLAM HCL 2 MG/2ML IJ SOLN
INTRAMUSCULAR | Status: AC
  Filled 2015-05-14: qty 2

## 2015-05-14 MED ORDER — LIDOCAINE-EPINEPHRINE 1 %-1:100000 IJ SOLN
INTRAMUSCULAR | Status: AC
  Filled 2015-05-14: qty 1

## 2015-05-14 NOTE — Procedures (Signed)
Technically successful US guided biopsy of dominant left sided parotid lesion.   Technically successful US guided biopsy of dominant right sided parotid lesion.   No immediate complications.   Ronny Bacon, MD Pager #: [phone]

## 2015-05-14 NOTE — H&P (Signed)
Chief Complaint: Patient was seen in consultation today for US guided bilateral parotid mass biopsies  Referring Physician(s): Wolicki,Karol  Supervising Physician: Sandi Mariscal  History of Present Illness: [name] is a 55 y.o. female with history of left breast cancer in May 2016 with prior lumpectomy and axillary node dissection, status post chemoradiation. 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 Subsequent MRI of the neck on 2/15 revealed a 7 mm right parotid and 12 mm left parotid tail nodules. Request now received from ENT for ultrasound-guided biopsies of both parotid masses.  Past Medical History  Diagnosis Date  . Cervical stenosis (uterine cervix)   . Abnormal Pap smear of cervix     Dr. Eilleen Kempf   . Hyperlipidemia   . Dizziness   . Insomnia   . Nervousness(799.21)   . Meniere's disease   . Benign hemangioma     Dr,  Marybelle Killings   . Allergy   . Breast cancer ([place]) 07/20/14    Left Breast  . S/P chemotherapy, time since 4-12 weeks     Past Surgical History  Procedure Laterality Date  . Uterine ablation  02/2006     Due to heavy periods Dr. Jon Billings   . Bilateral  tubal ligation  05/08/2004  . Tubal ligation    . Wisdom teeth extractions  yrs ago  . Shoulder open rotator cuff repair Right 07/28/2012    Procedure: RIGHT SHOULDER DECOMPRESSION AND EXCISION OF CALCIFIC DEPOSIT;  Surgeon: Tobi Bastos, MD;  Location: [facility];  Service: Orthopedics;  Laterality: Right;  . Breast lumpectomy with radioactive seed and sentinel lymph node biopsy Left 08/08/2014    Procedure: BREAST LUMPECTOMY WITH RADIOACTIVE SEED AND SENTINEL LYMPH NODE BIOPSY;  Surgeon: Coralie Keens, MD;  Location: [place];  Service: General;  Laterality: Left;  . Re-excision of breast lumpectomy Left 08/16/2014    Procedure: RE-EXCISION OF LEFT BREAST CANCER AND PORT PLACEMENT/POSSIBLE AXILLARY DISECTION;   Surgeon: Coralie Keens, MD;  Location: [place];  Service: General;  Laterality: Left;  . Portacath placement Right 08/16/2014    Procedure: INSERTION PORT-A-CATH;  Surgeon: Coralie Keens, MD;  Location: [place];  Service: General;  Laterality: Right;  . Axillary lymph node dissection Left 08/16/2014    Procedure: AXILLARY LYMPH NODE DISSECTION;  Surgeon: Coralie Keens, MD;  Location: [place];  Service: General;  Laterality: Left;  . Port-a-cath removal N/A 03/08/2015    Procedure: REMOVAL PORT-A-CATH;  Surgeon: Coralie Keens, MD;  Location: [facility];  Service: General;  Laterality: N/A;    Allergies: Crestor; Lipitor; and Temazepam  Medications: Prior to Admission medications   Medication Sig Start Date End Date Taking? Authorizing Provider  acetaminophen (TYLENOL) 325 MG tablet Take 975 mg by mouth daily as needed for moderate pain. Usually takes twice daily in morning and bedtime and additionally during the day if needed for pain   Yes Historical Provider, MD  anastrozole (ARIMIDEX) 1 MG tablet Take 1 tablet (1 mg total) by mouth daily. 04/06/15  Yes Nicholas Lose, MD  clonazePAM (KLONOPIN) 0.5 MG tablet Take 1 tablet (0.5 mg total) by mouth 3 (three) times daily as needed. 04/18/15  Yes Chipper Herb, MD  pantoprazole (PROTONIX) 20 MG tablet Take 20 mg by mouth daily.   Yes Historical Provider, MD  PARoxetine (PAXIL-CR) 25 MG 24 hr tablet Take 1 tablet (25 mg total) by mouth daily.  04/18/15  Yes Chipper Herb, MD  Vitamin D, Ergocalciferol, (DRISDOL) 50000 units CAPS capsule Take 1 capsule (50,000 Units total) by mouth every 7 (seven) days. Patient taking differently: Take 50,000 Units by mouth every Monday.  04/19/15  Yes Chipper Herb, MD     Family History  Problem Relation Age of Onset  . Cirrhosis Father   . Colon polyps Mother   . Colon cancer Neg Hx     Social History   Social History  . Marital Status: Married     Spouse Name: N/A  . Number of Children: N/A  . Years of Education: N/A   Social History Main Topics  . Smoking status: Never Smoker   . Smokeless tobacco: Never Used  . Alcohol Use: Yes     Comment: very rare  . Drug Use: No  . Sexual Activity: Yes    Birth Control/ Protection: Surgical   Other Topics Concern  . Not on file   Social History Narrative     Review of Systems  Constitutional: Negative for fever and chills.  HENT: Negative for sore throat and trouble swallowing.   Respiratory: Negative for cough and shortness of breath.   Cardiovascular: Negative for chest pain.  Gastrointestinal: Negative for nausea, vomiting, abdominal pain and blood in stool.  Genitourinary: Negative for dysuria and hematuria.  Musculoskeletal: Negative for back pain.  Neurological:       Occ HA  Psychiatric/Behavioral: The patient is nervous/anxious.     Vital Signs: BP 122/84 mmHg  Pulse 74  Temp(Src) 98.6 F (37 C) (Oral)  Resp 16  Ht '5\' 5"'$  (1.651 m)  Wt 148 lb (67.132 kg)  BMI 24.63 kg/m2  SpO2 99%  Physical Exam  Mallampati Score:     Imaging: Mr Neck Soft Tissue Only W Wo Contrast  04/25/2015  CLINICAL DATA:  History of left breast cancer status post lumpectomy and axillary lymph node dissection. Small parotid lesions on PET-CT. EXAM: MR NECK SOFT TISSUE ONLY WITHOUT AND WITH CONTRAST TECHNIQUE: Multiplanar, multisequence MR imaging of the neck was performed both before and after administration of intravenous contrast. CONTRAST:  65m MULTIHANCE GADOBENATE DIMEGLUMINE 529 MG/ML IV SOLN COMPARISON:  PET-CT 04/17/2015 FINDINGS: Axial and sagittal T1 postcontrast images are moderately motion degraded. There is a 12 x 10 mm enhancing soft tissue nodule near the tail of the left parotid corresponding to the lesion on PET-CT. It is not completely clear whether this lesion is separate from and abutting the deep aspect of the parotid tail or if it is projecting inferiorly from the  parotid. There is a 7 x 5 mm nodule with similar signal characteristics in the inferior aspect of the right parotid gland. Both parotid glands demonstrate baseline diffuse heterogeneity with tiny nodular foci of T1 hypointensity throughout. No significant surrounding inflammatory change is seen. The submandibular glands are unremarkable. No pharyngeal mass or laryngeal mass is seen. An 8 mm T2 hyperintense nodule is noted in the right thyroid lobe without significantly elevated FDG uptake on the recent PET-CT. No enlarged lymph nodes are identified elsewhere in the neck. Bilateral level II lymph nodes measure 6 mm in short axis. The visualized portion of the brain is unremarkable. Orbits are unremarkable. A small right maxillary sinus mucous retention cyst is noted. The mastoid air cells are clear. Mild cervical spondylosis is noted. IMPRESSION: 1. 7 mm right parotid nodule and 12 mm left parotid tail versus periparotid nodule. Considerations include intraparotid/periparotid lymph nodes and small primary parotid  neoplasm (such as benign mixed tumor or Warthin's tumor, although malignancy cannot be excluded by imaging alone). 2. Mild background heterogeneity/ micronodularity of both parotid glands. This could reflect the sequelae of chronic inflammation, an autoimmune process such as Sjogren's disease, or lymphoepithelial lesions from HIV. Electronically Signed   By: Logan Bores M.D.   On: 04/25/2015 09:23   Nm Pet Image Initial (pi) Skull Base To Thigh  04/17/2015  CLINICAL DATA:  Initial treatment strategy for left breast cancer. Bone pain. EXAM: NUCLEAR MEDICINE PET SKULL BASE TO THIGH TECHNIQUE: 7.2 mCi F-18 FDG was injected intravenously. Full-ring PET imaging was performed from the skull base to thigh after the radiotracer. CT data was obtained and used for attenuation correction and anatomic localization. FASTING BLOOD GLUCOSE:  Value: 96 new mg/dl COMPARISON:  Whole-body bone scan dated 12/20/2014. CT  chest abdomen pelvis dated 08/14/2014. FINDINGS: NECK 8 x 6 mm nodule in the right parotid gland (series 4/ image 20), max SUV 5.1. 11 x 9 mm nodule in the inferior left parotid gland (series 4/ image 23) versus a left level IIa lymph node, max SUV 9.3. Overall, this appearance favors bilateral Warthin's gland tumors, but is indeterminate. CHEST Lungs are clear. No suspicious pulmonary nodules. No pleural effusion or pneumothorax. The heart is normal in size.  No pericardial effusion. Postsurgical changes related to left breast lumpectomy in the upper/outer left breast. No hypermetabolic thoracic lymphadenopathy. Status post left axillary lymph node dissection. ABDOMEN/PELVIS No abnormal hypermetabolic activity within the liver, pancreas, adrenal glands, or spleen. Cholelithiasis (series 4/image 123), without associated inflammatory changes. No hypermetabolic lymph nodes in the abdomen or pelvis. SKELETON No focal hypermetabolic activity to suggest skeletal metastasis. IMPRESSION: Status post left breast lumpectomy and left axillary lymph node dissection. No findings specific for metastatic disease. Suspected small bilateral parotid lesions, left level IIa cervical lymph node not excluded. Overall, this appearance favors bilateral Warthin's gland tumors. Consider CT neck with contrast or MR face with/without contrast for further characterization as clinically warranted. Electronically Signed   By: Julian Hy M.D.   On: 04/17/2015 09:38    Labs:  CBC:  Recent Labs  01/15/15 0747 03/01/15 0910 03/30/15 1037 05/14/15 1219  WBC 3.1* 2.3* 2.6* 4.1  HGB 12.0 12.6 13.4 12.9  HCT 36.1 36.6 39.8 38.5  PLT 143* 136* 135* 128*    COAGS:  Recent Labs  05/14/15 1219  INR 0.99  APTT 27    BMP:  Recent Labs  09/09/14 2336  01/01/15 0812 01/08/15 0805 01/15/15 0747 03/30/15 1037  NA 132*  < > 141 141 140 139  K 3.6  < > 3.6 4.1 3.9 3.9  CL 99*  --   --   --   --   --   CO2 25  < > '25  25 24 26  '$ GLUCOSE 96  < > 97 104 99 90  BUN 15  < > 4.4* 7.1 8.6 10.5  CALCIUM 8.2*  < > 9.5 9.4 9.7 9.3  CREATININE 0.43*  < > 0.6 0.6 0.6 0.7  GFRNONAA >60  --   --   --   --   --   GFRAA >60  --   --   --   --   --   < > = values in this interval not displayed.  LIVER FUNCTION TESTS:  Recent Labs  01/01/15 2637 01/08/15 0805 01/15/15 0747 03/30/15 1037  BILITOT 0.34 <0.30 0.49 0.78  AST 40* 41* 36* 23  ALT 26 31 33 20  ALKPHOS 103 92 79 69  PROT 6.8 6.8 6.8 8.0  ALBUMIN 3.5 3.6 3.6 4.0    TUMOR MARKERS: No results for input(s): AFPTM, CEA, CA199, CHROMGRNA in the last 8760 hours.  Assessment and Plan: 55 y.o. female with history of left breast cancer in May 2016 with prior lumpectomy and axillary node dissection, status post chemoradiation. 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 Subsequent MRI of the neck on 2/15 revealed a 7 mm right parotid and 12 mm left parotid tail nodules. Request now received from ENT for ultrasound-guided biopsies of both parotid masses.Risks and benefits discussed with the patient/husband including, but not limited to bleeding, infection, damage to adjacent structures or low yield requiring additional tests.All of the patient's questions were answered, patient is agreeable to proceed.Consent signed and in chart.     Thank you for this interesting consult.  I greatly enjoyed meeting [name] and look forward to participating in their care.  A copy of this report was sent to the requesting provider on this date.  Electronically Signed: D. Rowe Robert 05/14/2015, 12:43 PM   I spent a total of 15 minutes in face to face in clinical consultation, greater than 50% of which was counseling/coordinating care for US guided bilateral parotid mass biopsies

## 2015-05-17 ENCOUNTER — Other Ambulatory Visit: Payer: Self-pay | Admitting: *Deleted

## 2015-05-17 ENCOUNTER — Telehealth: Payer: Self-pay

## 2015-05-17 DIAGNOSIS — C50412 Malignant neoplasm of upper-outer quadrant of left female breast: Secondary | ICD-10-CM

## 2015-05-17 NOTE — Telephone Encounter (Signed)
Spoke with patient and she is aware of her research labs 3/16 at 1:00.

## 2015-05-24 ENCOUNTER — Encounter: Payer: PRIVATE HEALTH INSURANCE | Admitting: *Deleted

## 2015-05-24 ENCOUNTER — Encounter: Payer: Self-pay | Admitting: *Deleted

## 2015-05-24 ENCOUNTER — Encounter: Payer: Self-pay | Admitting: Nurse Practitioner

## 2015-05-24 ENCOUNTER — Telehealth: Payer: Self-pay | Admitting: Nurse Practitioner

## 2015-05-24 ENCOUNTER — Other Ambulatory Visit (HOSPITAL_BASED_OUTPATIENT_CLINIC_OR_DEPARTMENT_OTHER): Payer: PRIVATE HEALTH INSURANCE

## 2015-05-24 ENCOUNTER — Ambulatory Visit (HOSPITAL_BASED_OUTPATIENT_CLINIC_OR_DEPARTMENT_OTHER): Payer: PRIVATE HEALTH INSURANCE | Admitting: Nurse Practitioner

## 2015-05-24 VITALS — BP 112/58 | HR 87 | Temp 98.4°F | Resp 18 | Ht 65.0 in | Wt 149.8 lb

## 2015-05-24 DIAGNOSIS — Z79811 Long term (current) use of aromatase inhibitors: Secondary | ICD-10-CM

## 2015-05-24 DIAGNOSIS — C50412 Malignant neoplasm of upper-outer quadrant of left female breast: Secondary | ICD-10-CM | POA: Diagnosis not present

## 2015-05-24 DIAGNOSIS — Z923 Personal history of irradiation: Secondary | ICD-10-CM | POA: Diagnosis not present

## 2015-05-24 DIAGNOSIS — Z9221 Personal history of antineoplastic chemotherapy: Secondary | ICD-10-CM | POA: Diagnosis not present

## 2015-05-24 DIAGNOSIS — Z170 Estrogen receptor positive status [ER+]: Secondary | ICD-10-CM | POA: Diagnosis not present

## 2015-05-24 DIAGNOSIS — Z006 Encounter for examination for normal comparison and control in clinical research program: Secondary | ICD-10-CM

## 2015-05-24 LAB — CBC WITH DIFFERENTIAL/PLATELET
BASO%: 0.6 % (ref 0.0–2.0)
Basophils Absolute: 0.1 10*3/uL (ref 0.0–0.1)
EOS%: 0.9 % (ref 0.0–7.0)
Eosinophils Absolute: 0.1 10*3/uL (ref 0.0–0.5)
HEMATOCRIT: 39.6 % (ref 34.8–46.6)
HEMOGLOBIN: 13.4 g/dL (ref 11.6–15.9)
LYMPH#: 2.5 10*3/uL (ref 0.9–3.3)
LYMPH%: 32.1 % (ref 14.0–49.7)
MCH: 33.1 pg (ref 25.1–34.0)
MCHC: 33.7 g/dL (ref 31.5–36.0)
MCV: 98.1 fL (ref 79.5–101.0)
MONO#: 0.8 10*3/uL (ref 0.1–0.9)
MONO%: 9.9 % (ref 0.0–14.0)
NEUT%: 56.5 % (ref 38.4–76.8)
NEUTROS ABS: 4.4 10*3/uL (ref 1.5–6.5)
Platelets: 161 10*3/uL (ref 145–400)
RBC: 4.04 10*6/uL (ref 3.70–5.45)
RDW: 12.6 % (ref 11.2–14.5)
WBC: 7.7 10*3/uL (ref 3.9–10.3)

## 2015-05-24 LAB — COMPREHENSIVE METABOLIC PANEL
ALBUMIN: 4.1 g/dL (ref 3.5–5.0)
ALK PHOS: 81 U/L (ref 40–150)
ALT: 30 U/L (ref 0–55)
AST: 24 U/L (ref 5–34)
Anion Gap: 7 mEq/L (ref 3–11)
BUN: 9.4 mg/dL (ref 7.0–26.0)
CALCIUM: 9.6 mg/dL (ref 8.4–10.4)
CO2: 28 mEq/L (ref 22–29)
CREATININE: 0.7 mg/dL (ref 0.6–1.1)
Chloride: 103 mEq/L (ref 98–109)
EGFR: >90 mL/min/{1.73_m2} (ref >90)
GLUCOSE: 91 mg/dL (ref 70–140)
POTASSIUM: 3.9 meq/L (ref 3.5–5.1)
SODIUM: 139 meq/L (ref 136–145)
TOTAL PROTEIN: 8.2 g/dL (ref 6.4–8.3)
Total Bilirubin: 0.72 mg/dL (ref 0.20–1.20)

## 2015-05-24 LAB — RESEARCH LABS

## 2015-05-24 NOTE — Progress Notes (Signed)
CLINIC:  Cancer Survivorship   REASON FOR VISIT:  Routine follow-up post-treatment for a recent history of breast cancer.  BRIEF ONCOLOGIC HISTORY:    Breast cancer of upper-outer quadrant of left female breast ([place])   07/18/2014 Mammogram Distortion left breast, breast density category C; U/S 1.3 x 0.8 x 0.8 cm left breast mass at 2:00 position 4 cm from nipple, no lymph nodes   07/20/2014 Initial Diagnosis Left breast Biopsy: Invasive lobular cancer with LCIS, Grade 1, ER 86%, PR 78%, Her 2 Neg Ratio 1.77, Ki 67: 14%   08/01/2014 Breast MRI Breast MRI showed non-mass enhancement 3.9 cm, no lymph nodes   08/01/2014 Clinical Stage Stage IIA: T2 N0   08/08/2014 Surgery Left breast lumpectomy: 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, with LCIS, medial and inferior margins positive, 3/4 lymph nodes positive   08/08/2014 Pathologic Stage Stage IIB: T2 N1c M0   08/16/2014 Surgery Left breast medial margin reexcision residual ILC 0.2 cm; inferior margin residual foci less than 0.2 cm, 1/5 lymph nodes positive, 1 lymph node with isolated tumor cells (Overall 4/10)   09/04/2014 - 01/15/2015 Chemotherapy Adjuvant chemotherapy with dose dense Adriamycin and Cytoxan 4 followed by Abraxane weekly 8 ( stopped early for profound neutropenia and thrombocytopenia)   01/29/2015 - 03/20/2015 Radiation Therapy Adjuvant XRT [facility]): 50.4 Gy over 28 fractions; seroma boost: 10 Gy over 5 fractions. Total dose: 60.4 Gy   04/11/2015 -  Anti-estrogen oral therapy Anastrozole 1 mg daily    INTERVAL HISTORY:  [name] presents to the [facility] today for our initial meeting to review her survivorship care plan detailing her treatment course for breast cancer, as well as monitoring long-term side effects of that treatment, education regarding health maintenance, screening, and overall wellness and health promotion.     [name] is a 55 year old female who reports feeling quite well since completing her  radiation therapy approximately two months ago.  She reports that the skin changes overlying her left breast have resolved since completing radiation and she has healed well following surgery. She states that her fatigue is minimal, if at all.  She is very active throughout the work day however does not exercise on a regular basis. She denies any headache, cough, shortness of breath or bone pain.  She has a good appetite and denies any weight loss.  She denies any changes in either breast. She began her anastrozole in February 2017 and thus far, is tolerating it well without hot flashes or vaginal dryness.  REVIEW OF SYSTEMS:  General: Denies fever, chills, unintentional weight loss, or generalized fatigue.  HEENT: Denies visual changes, hearing loss, mouth sores or difficulty swallowing. Cardiac: Denies palpitations, chest pain, and lower extremity edema.  Respiratory: Denies wheeze or dyspnea on exertion.  Breast: Denies any new nodularity, masses, tenderness, nipple changes, or nipple discharge.  GI: Denies abdominal pain, constipation, diarrhea, nausea, or vomiting.  GU: Denies dysuria, hematuria, vaginal bleeding, vaginal discharge, or vaginal dryness.  Musculoskeletal: Denies joint or bone pain.  Neuro: Denies recent fall or numbness / tingling in her extremities. Skin: Denies rash, pruritis, or open wounds.  Psych: Denies depression, anxiety, insomnia, or memory loss.   A 14-point review of systems was completed and was negative, except as noted above.   ONCOLOGY TREATMENT TEAM:  1. Surgeon:  Dr. Ninfa Linden at [facility]  2. Medical Oncologist: Dr. Lindi Adie 3. Radiation Oncologist: Dr. Lisbeth Renshaw    PAST MEDICAL/SURGICAL HISTORY:  Past Medical History  Diagnosis Date  . Cervical stenosis (uterine cervix) 2002  . Abnormal Pap smear of cervix 2002    Dr. Eilleen Kempf   . Hyperlipidemia 2007  . Dizziness 1991    secondary to Meniere's disease  . Insomnia 1997  .  Nervousness(799.21) 1987  . Meniere's disease 1991  . Benign hemangioma     Dr,  Marybelle Killings   . Allergy 2012    seasonal  . Breast cancer ([place]) 07/20/14    Left Breast   Past Surgical History  Procedure Laterality Date  . Uterine ablation  02/2006     Due to heavy periods Dr. Jon Billings   . Bilateral  tubal ligation  05/08/2004  . Tubal ligation    . Wisdom teeth extractions  yrs ago  . Shoulder open rotator cuff repair Right 07/28/2012    Procedure: RIGHT SHOULDER DECOMPRESSION AND EXCISION OF CALCIFIC DEPOSIT;  Surgeon: Tobi Bastos, MD;  Location: [facility];  Service: Orthopedics;  Laterality: Right;  . Breast lumpectomy with radioactive seed and sentinel lymph node biopsy Left 08/08/2014    Procedure: BREAST LUMPECTOMY WITH RADIOACTIVE SEED AND SENTINEL LYMPH NODE BIOPSY;  Surgeon: Coralie Keens, MD;  Location: [place];  Service: General;  Laterality: Left;  . Re-excision of breast lumpectomy Left 08/16/2014    Procedure: RE-EXCISION OF LEFT BREAST CANCER AND PORT PLACEMENT/POSSIBLE AXILLARY DISECTION;  Surgeon: Coralie Keens, MD;  Location: [place];  Service: General;  Laterality: Left;  . Portacath placement Right 08/16/2014    Procedure: INSERTION PORT-A-CATH;  Surgeon: Coralie Keens, MD;  Location: [place];  Service: General;  Laterality: Right;  . Axillary lymph node dissection Left 08/16/2014    Procedure: AXILLARY LYMPH NODE DISSECTION;  Surgeon: Coralie Keens, MD;  Location: [place];  Service: General;  Laterality: Left;  . Port-a-cath removal N/A 03/08/2015    Procedure: REMOVAL PORT-A-CATH;  Surgeon: Coralie Keens, MD;  Location: [facility];  Service: General;  Laterality: N/A;   GYN HISTORY Menarche was at age 52. First live birth at age 30.   OCP use for approximately 21 years.   Ovaries intact: yes.   Hysterectomy: yes. HRT use: 0 years. Colonoscopy: 2015 Mammogram within the last year:  yes. Number of breast biopsies: 1. Up to date with pelvic exams:  due now, scheduled. IUD: no  ALLERGIES:  Allergies  Allergen Reactions  . Crestor [Rosuvastatin] Other (See Comments)    myalgias  . Lipitor [Atorvastatin] Other (See Comments)    myalgias  . Temazepam Other (See Comments)    Pt does not remember     CURRENT MEDICATIONS:  Current Outpatient Prescriptions on File Prior to Visit  Medication Sig Dispense Refill  . acetaminophen (TYLENOL) 325 MG tablet Take 975 mg by mouth daily as needed for moderate pain. Usually takes twice daily in morning and bedtime and additionally during the day if needed for pain    . anastrozole (ARIMIDEX) 1 MG tablet Take 1 tablet (1 mg total) by mouth daily. 30 tablet 12  . clonazePAM (KLONOPIN) 0.5 MG tablet Take 1 tablet (0.5 mg total) by mouth 3 (three) times daily as needed. 90 tablet 5  . pantoprazole (PROTONIX) 20 MG tablet Take 20 mg by mouth daily.    Marland Kitchen PARoxetine (PAXIL-CR) 25 MG 24 hr tablet Take 1 tablet (25 mg total) by mouth daily. 30 tablet 11  . Vitamin D, Ergocalciferol, (DRISDOL) 50000 units CAPS capsule Take 1 capsule (50,000 Units total) by mouth every  7 (seven) days. (Patient taking differently: Take 50,000 Units by mouth every Monday. ) 12 capsule 0   No current facility-administered medications on file prior to visit.     ONCOLOGIC FAMILY HISTORY:  Family History  Problem Relation Age of Onset  . Cirrhosis Father   . Colon polyps Mother   . Colon cancer Neg Hx     GENETIC COUNSELING/TESTING: No   SOCIAL HISTORY:  [name] is married and lives with her spouse in [place], [place].  She has 2 children. Ms. Bartle is currently working and frequently travels with her job.  She denies any current or history of tobacco or illicit drug use.  She uses alcohol very rarely.    PHYSICAL EXAMINATION:  Vital Signs: Filed Vitals:   05/24/15 1319  BP: 112/58  Pulse: 87  Temp: 98.4 F (36.9 C)  Resp: 18    Height: 5'5 Weight: 149 ECOG Performance Status: 0  General: Well-nourished, well-appearing female in no acute distress.  She is unaccompanied in clinic  today.   HEENT: Head is atraumatic and normocephalic.  Pupils equal and reactive to light and accomodation. Conjunctivae clear without exudate.  Sclerae anicteric. Oral mucosa is pink, moist, and intact without lesions.  Oropharynx is pink without lesions or erythema.  Lymph: No cervical, supraclavicular, infraclavicular, or axillary lymphadenopathy noted on palpation.  Cardiovascular: Regular rate and rhythm without murmurs, rubs, or gallops. Respiratory: Clear to auscultation bilaterally. Chest expansion symmetric without accessory muscle use on inspiration or expiration.  Breast exam: Skin darkened over left breast without erythema or open areas. Left umpectomy and axillary scar intact without nodularity or signs of infection. No mass or nodule. GI: Abdomen soft and round. No tenderness to palpation. Bowel sounds normoactive in 4 quadrants. No hepatosplenomegaly.   GU: Deferred.  Musculoskeletal: Muscle strength 5/5 in all extremities.   Neuro: No focal deficits. Steady gait.  Psych: Mood and affect normal and appropriate for situation.  Extremities: No edema, cyanosis, or clubbing.  Skin: Warm and dry. No open lesions noted.   LABORATORY DATA:  None for this visit.  DIAGNOSTIC IMAGING:  None for this visit.     ASSESSMENT AND PLAN:   1. 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  [name] is doing well without clinical symptoms worrisome for disease recurrence. She has recovered well from her surgery and radiation therapy with resolution of all surgical and radiation side effects.  She will follow-up with her medical oncologist,  Dr. Lindi Adie, in April 2017 with history and physical examination per surveillance protocol.  She will continue her anti-estrogen therapy with anastrozole and await any change in therapy based on study randomization.  She was instructed to make [place] aware if she begins to experience any new or increased side effects of the medication and I could see her back in clinic to help manage those side effects, as needed. A comprehensive survivorship care plan and treatment summary was reviewed with the patient today detailing her breast cancer diagnosis, treatment course, potential late/long-term effects of treatment, appropriate follow-up care with recommendations for the future, and patient  education resources.  A copy of this summary, along with a letter will be sent to the patient's primary care provider via in basket message after today's visit.  [name] is welcome to return to the Survivorship Clinic in the future, as needed; no follow-up will be scheduled at this time.    2. Bone health:  Given Ms. Franken's age/history of breast cancer and her current treatment regimen including endocrine therapy with anastrozole, she is at risk for bone demineralization.  Per our records, she has not completed baseline bone density testing and will discuss this further with Dr. Lindi Adie at her next appointment. We will continue to monitor this closely while she is on endocrine therapy.  In the meantime, she was encouraged to increase her consumption of foods rich in calcium and vitamin D as well as to increase her weight-bearing activities.  She was given education on specific activities to promote bone health.  3. Cancer screening:  Due to  Ms. Norrington's history and her age, she should receive screening for skin cancers, colon cancer, and gynecologic cancers.  The information and recommendations are listed on the patient's comprehensive care plan/treatment summary and were reviewed in detail with the patient.    4. Health maintenance and wellness promotion: Ms. Cranfield was encouraged to consume 5-7 servings of fruits and vegetables per day. We reviewed the "Nutrition Rainbow" handout, as well as discussed recommendations to maximize nutrition and minimize recurrence, such as increased intake of fruits, vegetables, lean proteins, and minimizing the intake of red meats and processed foods.  She was also encouraged to engage in moderate to vigorous exercise for 30 minutes per day most days of the week. We discussed the [employer] fitness program, which is designed for cancer survivors to help them become more physically fit after cancer treatments.  She was instructed to limit her alcohol consumption and continue to abstain from tobacco use.  A copy of the "Take Control of Your Health" brochure was given to her reinforcing these recommendations.   5. Support services/counseling: It is not uncommon for this period of the patient's cancer care trajectory to be one of many emotions and stressors.  We discussed an opportunity for her to participate in the next session of [facility] ("Finding Your New Normal") support group series designed for patients after they have completed treatment   Ms. Hathaway was encouraged to take advantage of our many other support services programs, support groups, and/or counseling in coping with her new life as a cancer survivor after completing anti-cancer treatment. She was offered support today through active listening and expressive supportive counseling.  She was given information regarding our available services and encouraged to contact me with any questions or for help enrolling in any of our support group/programs.     A total of 50 minutes of face-to-face time was spent with this patient with greater than 50% of that time in counseling and care-coordination.   Sylvan Cheese, NP  Survivorship Program [facility] [phone]   Note: PRIMARY CARE PROVIDER Redge Gainer, [place] [phone]

## 2015-05-24 NOTE — Progress Notes (Signed)
05/24/2015 Patient in to clinic today for survivorship appointment, and continued screening procedures. Prior to appointment, patient proceeded to lab for collection of blood samples, both routine standard of care and baseline research blood sample.  Plans made to continue with study enrollment once eligibility criteria are confirmed, and verification of tissue sample receipt by study central lab. Anticipated treatment start date is March 22,2017, to allow for use of screening assessments as Cycle 1, Day 1 values. [name] BSN, RN, CCRP 05/24/2015 1:58 PM

## 2015-05-24 NOTE — Telephone Encounter (Signed)
per pof to sch pt appt-popf not clear to sch w/Cynthia Brigitte Pulse each lab but sch each time-gave pt copy of avs

## 2015-05-25 LAB — HEMOGLOBIN A1C
ESTIMATED AVERAGE GLUCOSE: 100 mg/dL
HEMOGLOBIN A1C: 5.1 % (ref 4.8–5.6)

## 2015-05-29 ENCOUNTER — Other Ambulatory Visit: Payer: Self-pay | Admitting: *Deleted

## 2015-05-30 ENCOUNTER — Other Ambulatory Visit: Payer: PRIVATE HEALTH INSURANCE

## 2015-05-30 ENCOUNTER — Encounter: Payer: PRIVATE HEALTH INSURANCE | Admitting: *Deleted

## 2015-05-30 ENCOUNTER — Telehealth: Payer: Self-pay | Admitting: *Deleted

## 2015-05-30 DIAGNOSIS — C50412 Malignant neoplasm of upper-outer quadrant of left female breast: Secondary | ICD-10-CM

## 2015-05-30 LAB — RESEARCH LABS

## 2015-05-30 NOTE — Telephone Encounter (Signed)
05/30/2015 Spoke with patient by phone due to no show for appointment this morning. Patient states that she was up during the night with GI upset and was not able to come in early this morning. She agrees to come in at 1pm today if she is feeling better, for Cycle 1, Day 1 assessments. Explained options to patient, which include the following: - Reschedule C1D1 visit to anytime today or tomorrow before 3pm (each day, to allow for shipping of blood samples collected). - Reschedule C1D1 visit to no later than Tuesday 3/28. If not done by tomorrow, will require additional blood sample collection (hematology and chemistry) and repeat H&P by Dr. Lindi Adie or APP. - If patient does not wish to proceed with study, it is her right to withdraw, however, she cannot be re-enrolled at a later date, if she changes her mind about participating. Patient was appreciative of the options given to her, and hopes that she will feel better soon and will be able to come in for her appointment later today. Patient requested that she be contacted at 12:00 to see if she is able to come in, as she states she will be on her way at that time. [name] BSN, RN, [facility] 05/30/2015 8:34 AM

## 2015-05-30 NOTE — Progress Notes (Signed)
05/30/2015 Patient in to clinic today for Cycle 1, Day 1 study visit. Patient has not taken anti-hormone therapy today, and reports that she normally takes her dose in the evening. Baseline patient questionnaires were completed independently by the patient. Patient proceeded to the lab for collection of research blood samples for the Cycle 1, Day 1 visit. Of note, other C1D1 assessments (H&P, VS, PS, CBC and CMET) were not required to be repeated at this visit, since screening assessments were done within 7 days of C1D1. Patient was given the Cycle 1 drug diary for anti-hormone therapy, to be completed starting today. Patient instructions were reviewed in detail with the patient and she had no further questions. Instructed patient to return the completed diary at the next cycle visit on April 19th.  Discussed need for a study related telephone contact on Day 14 (no clinic visit required for Arm B patients). Plans made to contact patient by phone on Tuesday 06/12/15 or Wednesday 06/13/15. Patient was given an updated schedule with her current appointments. She is aware that following a Cycle 3, Day 1 visit in May 2017, visits will then go to every 3 months. Thanked patient for her participation.  Baseline Adverse Event Log Event Grade Onset Date Resolved Date Attribution Treatment Comments  Dizziness, intermittent Grade 1 1991 ongoing N/A - baseline Klonopin for prophylaxis Mild in nature, except for severe attacks related to Meniere's (none for many months)  Insomnia Grade 1 1997 ongoing  N/A - baseline None Patient reports mild symptoms.  Anxiety  Grade 1 1987 ongoing  N/A - baseline Paxil Patient reports mild symptoms.  Diarrhea Grade 1 05/29/2015 ongoing N/A - baseline None Began having loose stools last night, less than four total.   [name] BSN, RN, [place] 05/30/2015 2:05 PM

## 2015-06-09 ENCOUNTER — Other Ambulatory Visit: Payer: Self-pay | Admitting: Family Medicine

## 2015-06-12 ENCOUNTER — Encounter: Payer: Self-pay | Admitting: *Deleted

## 2015-06-12 ENCOUNTER — Telehealth: Payer: Self-pay | Admitting: *Deleted

## 2015-06-12 DIAGNOSIS — C50412 Malignant neoplasm of upper-outer quadrant of left female breast: Secondary | ICD-10-CM

## 2015-06-12 NOTE — Telephone Encounter (Signed)
06/12/2015 See Research encounter for details regarding today Cycle 1, Day 14 telephone call for adverse events assessment on the Alliance AFT-05 PALLAS study. See Consent note regarding the Alliance ABC trial. Gwynn Burly. Brigitte Pulse BSN, RN, [place] 06/12/2015 3:11 PM

## 2015-06-12 NOTE — Progress Notes (Signed)
06/12/2015 Spoke with patient by phone today for AE assessment on Cycle 1, Day 14. Patient reports that she developed URI symptoms on Cycle 1, Day 1. She was then seen at the [facility] in [place] on 06/04/2015 and was diagnosed with pneumonia. She states she was given three prescriptions, cough medicine, an antibiotic, and a nose spray which she has not used. Requested that patient bring these prescriptions with her at her next visit so they can be recorded appropriately. She states that the diarrhea she experienced lasted for 3 days, with a maximum of 5 stools per 24 hour period. Her only other complaint is sweats, not associated with hot flashes, that disturbed her sleep and required a change of clothes. Confirmed next clinic appointment on 06/27/2015. [name] BSN, RN, [place] 06/12/2015 3:08 PM  Adverse Event Log - Cycle 1, Days 1-14 Event Grade Onset Date Resolved Date Attribution to anastrozole Treatment Comments  Dizziness, intermittent Grade 1 1991 ongoing N/A - baseline Klonopin for prophylaxis Mild in nature, except for severe attacks related to Meniere's (none for many months)  Insomnia Grade 1 1997 ongoing  N/A - baseline None Patient reports mild symptoms.  Anxiety  Grade 1 1987 ongoing  N/A - baseline Paxil Patient reports mild symptoms.  Diarrhea Grade 2 05/29/2015 05/30/2012 N/A - baseline None Maximum of 5 stools per 24hr.  URI Grade 2 05/30/2015 06/04/2015  None Complaints of stuffy nose and sinus drainage.  Pneumonia Grade 2 06/04/2015 ongoing  Medication Condition improved.  Sweats, intermittent  06/05/2015 ongoing  None

## 2015-06-13 ENCOUNTER — Encounter: Payer: PRIVATE HEALTH INSURANCE | Admitting: *Deleted

## 2015-06-13 ENCOUNTER — Other Ambulatory Visit: Payer: PRIVATE HEALTH INSURANCE

## 2015-06-26 ENCOUNTER — Other Ambulatory Visit: Payer: Self-pay | Admitting: Family Medicine

## 2015-06-26 NOTE — Telephone Encounter (Signed)
Last seen and last Vit D level 04/18/15  Dr Laurance Flatten   23.2

## 2015-06-27 ENCOUNTER — Other Ambulatory Visit (HOSPITAL_BASED_OUTPATIENT_CLINIC_OR_DEPARTMENT_OTHER): Payer: PRIVATE HEALTH INSURANCE

## 2015-06-27 ENCOUNTER — Ambulatory Visit (HOSPITAL_BASED_OUTPATIENT_CLINIC_OR_DEPARTMENT_OTHER): Payer: PRIVATE HEALTH INSURANCE | Admitting: Hematology and Oncology

## 2015-06-27 ENCOUNTER — Telehealth: Payer: Self-pay | Admitting: Hematology and Oncology

## 2015-06-27 ENCOUNTER — Encounter: Payer: Self-pay | Admitting: Hematology and Oncology

## 2015-06-27 ENCOUNTER — Encounter: Payer: PRIVATE HEALTH INSURANCE | Admitting: *Deleted

## 2015-06-27 VITALS — Ht 65.0 in

## 2015-06-27 DIAGNOSIS — C50412 Malignant neoplasm of upper-outer quadrant of left female breast: Secondary | ICD-10-CM

## 2015-06-27 DIAGNOSIS — Z79811 Long term (current) use of aromatase inhibitors: Secondary | ICD-10-CM

## 2015-06-27 DIAGNOSIS — Z006 Encounter for examination for normal comparison and control in clinical research program: Secondary | ICD-10-CM

## 2015-06-27 DIAGNOSIS — Z170 Estrogen receptor positive status [ER+]: Secondary | ICD-10-CM | POA: Diagnosis not present

## 2015-06-27 LAB — CBC WITH DIFFERENTIAL/PLATELET
BASO%: 0.3 % (ref 0.0–2.0)
Basophils Absolute: 0 10*3/uL (ref 0.0–0.1)
EOS%: 1.2 % (ref 0.0–7.0)
Eosinophils Absolute: 0.1 10*3/uL (ref 0.0–0.5)
HCT: 38.4 % (ref 34.8–46.6)
HGB: 12.8 g/dL (ref 11.6–15.9)
LYMPH%: 40.8 % (ref 14.0–49.7)
MCH: 32.9 pg (ref 25.1–34.0)
MCHC: 33.3 g/dL (ref 31.5–36.0)
MCV: 98.6 fL (ref 79.5–101.0)
MONO#: 0.5 10*3/uL (ref 0.1–0.9)
MONO%: 11.1 % (ref 0.0–14.0)
NEUT#: 2 10*3/uL (ref 1.5–6.5)
NEUT%: 46.6 % (ref 38.4–76.8)
PLATELETS: 159 10*3/uL (ref 145–400)
RBC: 3.9 10*6/uL (ref 3.70–5.45)
RDW: 13.3 % (ref 11.2–14.5)
WBC: 4.4 10*3/uL (ref 3.9–10.3)
lymph#: 1.8 10*3/uL (ref 0.9–3.3)

## 2015-06-27 LAB — COMPREHENSIVE METABOLIC PANEL
ALT: 19 U/L (ref 0–55)
ANION GAP: 10 meq/L (ref 3–11)
AST: 19 U/L (ref 5–34)
Albumin: 3.9 g/dL (ref 3.5–5.0)
Alkaline Phosphatase: 67 U/L (ref 40–150)
BUN: 17 mg/dL (ref 7.0–26.0)
CHLORIDE: 106 meq/L (ref 98–109)
CO2: 25 meq/L (ref 22–29)
CREATININE: 0.6 mg/dL (ref 0.6–1.1)
Calcium: 9.5 mg/dL (ref 8.4–10.4)
Glucose: 101 mg/dl (ref 70–140)
POTASSIUM: 4.3 meq/L (ref 3.5–5.1)
Sodium: 140 mEq/L (ref 136–145)
Total Bilirubin: 0.55 mg/dL (ref 0.20–1.20)
Total Protein: 7.6 g/dL (ref 6.4–8.3)

## 2015-06-27 NOTE — Progress Notes (Signed)
Patient Care Team: Chipper Herb, MD as PCP - General (Family Medicine) Nicholas Lose, MD as Consulting Physician (Hematology and Oncology) Coralie Keens, MD as Consulting Physician (General Surgery) Kyung Rudd, MD as Consulting Physician (Radiation Oncology) Sylvan Cheese, NP as Nurse Practitioner (Hematology and Oncology)  DIAGNOSIS: Breast cancer of upper-outer quadrant of left female breast [facility])   Staging form: Breast, AJCC 7th Edition     Clinical stage from 08/01/2014: Stage IIA (T2, N0, M0) - Unsigned     Pathologic stage from 08/08/2014: Stage IIB (T2, N1c, cM0) - Unsigned  SUMMARY OF ONCOLOGIC HISTORY:   Breast cancer of upper-outer quadrant of left female breast ([place])   07/18/2014 Mammogram Distortion left breast, breast density category C; U/S 1.3 x 0.8 x 0.8 cm left breast mass at 2:00 position 4 cm from nipple, no lymph nodes   07/20/2014 Initial Diagnosis Left breast Biopsy: Invasive lobular cancer with LCIS, Grade 1, ER 86%, PR 78%, Her 2 Neg Ratio 1.77, Ki 67: 14%   08/01/2014 Breast MRI Breast MRI showed non-mass enhancement 3.9 cm, no lymph nodes   08/01/2014 Clinical Stage Stage IIA: T2 N0   08/08/2014 Surgery Left breast lumpectomy: 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, with LCIS, medial and inferior margins positive, 3/4 lymph nodes positive   08/08/2014 Pathologic Stage Stage IIB: T2 N1c M0   08/16/2014 Surgery Left breast medial margin reexcision residual ILC 0.2 cm; inferior margin residual foci less than 0.2 cm, 1/5 lymph nodes positive, 1 lymph node with isolated tumor cells (Overall 4/10)   09/04/2014 - 01/15/2015 Chemotherapy Adjuvant chemotherapy with dose dense Adriamycin and Cytoxan 4 followed by Abraxane weekly 8 ( stopped early for profound neutropenia and thrombocytopenia)   01/29/2015 - 03/20/2015 Radiation Therapy Adjuvant XRT [facility]): 50.4 Gy over 28 fractions; seroma boost: 10 Gy over 5 fractions. Total dose: 60.4 Gy   04/11/2015 -   Anti-estrogen oral therapy Anastrozole 1 mg daily (PALLAS clinical trial 05/24/2015 patient was randomized to hormone therapy alone)   05/24/2015 Survivorship Survivorship visit completed and copy of care plan given to patient   CHIEF COMPLIANT: Follow-up on anastrozole (PALLAS trial participant)  INTERVAL HISTORY: [name] is a 55 year old with above-mentioned history of left breast cancer currently on adjuvant antiestrogen therapy. She appears to be tolerating anastrozole fairly well. She has had initially some hot flashes but they have resolved. She continues to have aches and pains that radiate from her hips down to her legs. This is especially worse in the morning as well as later in the day. She has been enjoying spending time on the beach.  REVIEW OF SYSTEMS:   Constitutional: Denies fevers, chills or abnormal weight loss Eyes: Denies blurriness of vision Ears, nose, mouth, throat, and face: Denies mucositis or sore throat Respiratory: Denies cough, dyspnea or wheezes Cardiovascular: Denies palpitation, chest discomfort Gastrointestinal:  Denies nausea, heartburn or change in bowel habits Skin: Denies abnormal skin rashes Lymphatics: Denies new lymphadenopathy or easy bruising Neurological:Denies numbness, tingling or new weaknesses Behavioral/Psych: Mood is stable, no new changes  Extremities: No lower extremity edema Breast:  denies any pain or lumps or nodules in either breasts All other systems were reviewed with the patient and are negative.  I have reviewed the past medical history, past surgical history, social history and family history with the patient and they are unchanged from previous note.  ALLERGIES:  is allergic to crestor; lipitor; and temazepam.  MEDICATIONS:  Current Outpatient Prescriptions  Medication Sig Dispense  Refill  . acetaminophen (TYLENOL) 325 MG tablet Take 975 mg by mouth daily as needed for moderate pain. Usually takes twice daily in morning  and bedtime and additionally during the day if needed for pain    . anastrozole (ARIMIDEX) 1 MG tablet Take 1 tablet (1 mg total) by mouth daily. 30 tablet 12  . clonazePAM (KLONOPIN) 0.5 MG tablet Take 1 tablet (0.5 mg total) by mouth 3 (three) times daily as needed. 90 tablet 5  . pantoprazole (PROTONIX) 20 MG tablet Take 20 mg by mouth daily. Estimated start date    . PARoxetine (PAXIL-CR) 25 MG 24 hr tablet Take 1 tablet (25 mg total) by mouth daily. 30 tablet 11  . PARoxetine (PAXIL-CR) 25 MG 24 hr tablet TAKE 1 TABLET BY MOUTH EVERY DAY 30 tablet 1  . Vitamin D, Ergocalciferol, (DRISDOL) 50000 units CAPS capsule TAKE 1 CAPSULE (50,000 UNITS TOTAL) BY MOUTH EVERY 7 (SEVEN) DAYS. 12 capsule 0   No current facility-administered medications for this visit.    PHYSICAL EXAMINATION: ECOG PERFORMANCE STATUS: 1 - Symptomatic but completely ambulatory  Filed Vitals:   06/27/15 0817  BP: 126/77  Pulse: 80  Temp: 98.2 F (36.8 C)  Resp: 18   Filed Weights   06/27/15 0817  Weight: 149 lb 6.4 oz (67.767 kg)    GENERAL:alert, no distress and comfortable SKIN: skin color, texture, turgor are normal, no rashes or significant lesions EYES: normal, Conjunctiva are pink and non-injected, sclera clear OROPHARYNX:no exudate, no erythema and lips, buccal mucosa, and tongue normal  NECK: supple, thyroid normal size, non-tender, without nodularity LYMPH:  no palpable lymphadenopathy in the cervical, axillary or inguinal LUNGS: clear to auscultation and percussion with normal breathing effort HEART: regular rate & rhythm and no murmurs and no lower extremity edema ABDOMEN:abdomen soft, non-tender and normal bowel sounds MUSCULOSKELETAL:no cyanosis of digits and no clubbing  NEURO: alert & oriented x 3 with fluent speech, no focal motor/sensory deficits EXTREMITIES: No lower extremity edema  LABORATORY DATA:  I have reviewed the data as listed   Chemistry      Component Value Date/Time   NA  140 06/27/2015 0803   NA 132* 09/09/2014 2336   NA 141 07/21/2013 1023   K 4.3 06/27/2015 0803   K 3.6 09/09/2014 2336   CL 99* 09/09/2014 2336   CO2 25 06/27/2015 0803   CO2 25 09/09/2014 2336   BUN 17.0 06/27/2015 0803   BUN 15 09/09/2014 2336   BUN 14 07/21/2013 1023   CREATININE 0.6 06/27/2015 0803   CREATININE 0.43* 09/09/2014 2336   CREATININE 0.54 10/04/2012 1123      Component Value Date/Time   CALCIUM 9.5 06/27/2015 0803   CALCIUM 8.2* 09/09/2014 2336   ALKPHOS 67 06/27/2015 0803   ALKPHOS 70 09/09/2014 2336   AST 19 06/27/2015 0803   AST 19 09/09/2014 2336   ALT 19 06/27/2015 0803   ALT 26 09/09/2014 2336   BILITOT 0.55 06/27/2015 0803   BILITOT 0.8 09/09/2014 2336      Lab Results  Component Value Date   WBC 4.4 06/27/2015   HGB 12.8 06/27/2015   HCT 38.4 06/27/2015   MCV 98.6 06/27/2015   PLT 159 06/27/2015   NEUTROABS 2.0 06/27/2015   ASSESSMENT & PLAN:  Breast cancer of upper-outer quadrant of left female breast Left breast lumpectomy 08/08/2014: 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, with LCIS, medial and inferior margins positive, 3/4 lymph nodes positive T2 N1 cM0 stage IIB,  ER 86%, PR 78%, HER-2/neu negative ratio 1.77, Ki-67 14% Left breast medial margin reexcision 08/18/14: residual ILC 0.2 cm; inferior margin residual foci less than 0.2 cm, 1/5 lymph nodes positive, 1 lymph node with isolated tumor cells (Overall 4/10)  Treatment Summary: 1. Adjuvant chemotherapy with dose dense Adriamycin and Cytoxan x 4 followed by Abraxane weekly 12 because of lymph node positive disease. Started 09/04/14 to 01/15/15 2. adjuvant radiation completed 03/20/15 3. Followed by antiestrogen therapy started 04/03/15 -------------------------------------------------------------------------------------------------------------------------- Anastrozole toxicities: Patient denies any hot flashes or myalgias. PALLAS Trial : Patient has been randomized to anastrozole  alone. Evaluation for ABC clinical trial: The trial randomizes between aspirin versus placebo. Patient is signed the consent form today. She understands the risks and benefits of aspirin therapy.  Parotid nodules: Biopsy-proven to be lymphoid infiltrates. No malignant cells seen I recommended obtaining a bone density test in addition to mammogram at [place]  Return to clinic next month.  The patient has a good understanding of the overall plan. she agrees with it. she will call with any problems that may develop before the next visit here.    Orders Placed This Encounter  Procedures  . DG Bone Density    Standing Status: Future     Number of Occurrences:      Standing Expiration Date: 06/26/2016    Order Specific Question:  Reason for Exam (SYMPTOM  OR DIAGNOSIS REQUIRED)    Answer:  Post menopausal on Ai therapy    Order Specific Question:  Is the patient pregnant?    Answer:  No    Order Specific Question:  Preferred imaging location?    Answer:  External     Comments:  [facility]  . MM DIAG BREAST TOMO BILATERAL    Standing Status: Future     Number of Occurrences:      Standing Expiration Date: 08/26/2016    Order Specific Question:  Reason for Exam (SYMPTOM  OR DIAGNOSIS REQUIRED)    Answer:  Annual diagnostic mammogram    Order Specific Question:  Is the patient pregnant?    Answer:  No    Order Specific Question:  Preferred imaging location?    Answer:  External     Comments:  [facility]   The patient has a good understanding of the overall plan. she agrees with it. she will call with any problems that may develop before the next visit here.   Rulon Eisenmenger, MD 06/27/2015

## 2015-06-27 NOTE — Progress Notes (Signed)
Unable to get in to exam room prior to MD.  No assessment performed.  

## 2015-06-27 NOTE — Telephone Encounter (Signed)
appt made and avs printed. Bone density and mammo sch for [facility] May 18 at 10 am. Order faxed 4/19

## 2015-06-27 NOTE — Assessment & Plan Note (Signed)
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 Adjuvant chemotherapy with dose dense Adriamycin and Cytoxan x 4 followed by Abraxane weekly 12 because of lymph node positive disease. Started 09/04/14 to 01/15/15 2. adjuvant radiation completed 03/20/15 3. Followed by antiestrogen therapy started 04/03/15 -------------------------------------------------------------------------------------------------------------------------- Anastrozole toxicities: Patient denies any hot flashes or myalgias. PALLAS Trial : Patient has been randomized to anastrozole alone. Evaluation for ABC clinical trial: The trial randomizes between aspirin versus placebo. Patient is signed the consent form today. She understands the risks and benefits of aspirin therapy.  Parotid nodules: Biopsy-proven to be lymphoid infiltrates. No malignant cells seen I recommended obtaining a bone density test in addition to mammogram at [place]  Return to clinic next month.  The patient has a good understanding of the overall plan. she agrees with it. she will call with any problems that may develop before the next visit here.

## 2015-06-27 NOTE — Progress Notes (Signed)
06/27/2015 Patient in to clinic unaccompanied today for evaluation at Cycle 2, Day 1  Upon arrival to clinic, patient independently completed her patient questionnaires for PROs and adherence. Patient forgot to bring her completed Cycle 1 drug diary for anti-hormone therapy, although she states that she hasn't missed any doses. Patient was given a pre-addressed, postage-paid envelope to mail the completed questionnaire back to the Research Department. Patient was given the Cycle 2 drug diary for anti-hormone therapy.   Discussed need for a study related telephone contact on Day 14 (no clinic visit required for Arm B patients). Plans made to contact patient by phone in approximately two weeks. Discussed upcoming study visits, including lab and MD visits on Cycle 3, Day 1 with next visit occurring at Cycle 6, Day 1, approximately 3 months later. Patient and MD are in agreement with this plan.  [name] BSN, RN, CCRP 06/27/2015 11:59 AM  Adverse Event Log - Cycle 1, Days 15-28 Event Grade Onset Date Resolved Date Attribution to anastrozole Treatment Comments  Dizziness, intermittent Grade 1 1991 ongoing N/A - baseline Klonopin for prophylaxis   Insomnia Grade 1 1997 ongoing  N/A - baseline None   Anxiety  Grade 1 1987 ongoing  N/A - baseline Paxil .  URI Grade 2 05/30/2015 06/04/2015 No None   Pneumonia Grade 2 06/04/2015 06/11/2015 No Medication Symptoms resolved within one week.  Sweats, intermittent Grade 2 06/05/2015 06/07/2015 Yes None Moderate in nature; no more occurred after initial episodes.  Hiccups, intermittent Grade 1 09/2014 ongoing N/A - baseline None Started during chemo, no treatment required.  Pain (hips to lower legs), intermittent Grade 2 09/2014 ongoing N/A - baseline Tylenol Started during chemo, estimated start date.  [name] BSN, RN, [facility] 07/03/2015 3:48 PM

## 2015-07-02 ENCOUNTER — Other Ambulatory Visit: Payer: Self-pay

## 2015-07-10 ENCOUNTER — Telehealth: Payer: Self-pay | Admitting: *Deleted

## 2015-07-10 NOTE — Telephone Encounter (Signed)
07/10/2015 Spoke with patient by phone today for Day 14 adverse events assessment. Patient has no new symptoms to report since her clinic visit two weeks ago. Patient states that she has not yet mailed her Cycle 1 diaries to the site, as she has been very busy. She is waiting for a plane for a business trip to [place], and she will return Thursday night. She hopes to mail the diary in the envelope provided to her, on Friday, after she returns home. Thanked patient for her participation in this trial. Jade S. [name] BSN, RN, [place] 07/10/2015 9:47 AM

## 2015-07-11 ENCOUNTER — Other Ambulatory Visit: Payer: PRIVATE HEALTH INSURANCE

## 2015-07-11 ENCOUNTER — Encounter: Payer: PRIVATE HEALTH INSURANCE | Admitting: *Deleted

## 2015-07-18 ENCOUNTER — Telehealth: Payer: Self-pay | Admitting: *Deleted

## 2015-07-18 NOTE — Telephone Encounter (Addendum)
07/18/2015 Received telephone call from patient stating that she has just learned that she will need to return to [place] for work next week, and will not be back in town until Wednesday night (5/17). Patient is requesting that 5/17 appointments be moved to the afternoon of 5/18. InBasket message sent to Dr. Lindi Adie to inquire about availability on his schedule, noting that this is a Cycle 3 visit for the PALLAS study, plus a treatment start visit for the [facility] ABC trial. Patient aware that I will work on getting appointments moved, and that she can expect to be contacted by the scheduling staff, hopefully by tomorrow. Patient also stated that she mailed Cycle 1 medication diary for the PALLAS study today. [name] BSN, RN, CCRP 07/18/2015 1:56 PM

## 2015-07-19 ENCOUNTER — Other Ambulatory Visit: Payer: Self-pay | Admitting: *Deleted

## 2015-07-19 DIAGNOSIS — C50412 Malignant neoplasm of upper-outer quadrant of left female breast: Secondary | ICD-10-CM

## 2015-07-19 NOTE — Telephone Encounter (Signed)
07/19/2015 Return phone call to patient today to let her know that appointments were moved to Thursday 5/18 at 12:00 for lab work, followed by MD appointment with Dr. Lindi Adie at 12:30. Scheduler preferred earlier appointments based on MD schedule that day (on call), however, patient also has 10am appointment at [facility] for mammogram and bone density scan. Encouraged patient to arrive as soon as she could, so that we can try to accommodate her sooner, per Dr. Geralyn Flash schedule. Explained to patient that she will need to complete questionnaires upon arrival, and that her lab appointment will consist of extra tubes of blood collected for the Alliance ABC study, as well as a urine sample. She will have a physical exam by Dr. Lindi Adie, and study medication (aspirin/placebo) will be dispensed. She will receive medication diaries for both the PALLAS and ABC studies that day.  Left my call back number [phone] in case the patient has any further questions or needs more information. [name] BSN, RN, [place] 07/19/2015 10:58 AM

## 2015-07-20 ENCOUNTER — Encounter: Payer: Self-pay | Admitting: Hematology and Oncology

## 2015-07-25 ENCOUNTER — Other Ambulatory Visit: Payer: PRIVATE HEALTH INSURANCE

## 2015-07-25 ENCOUNTER — Ambulatory Visit: Payer: PRIVATE HEALTH INSURANCE | Admitting: Hematology and Oncology

## 2015-07-26 ENCOUNTER — Encounter: Payer: Self-pay | Admitting: *Deleted

## 2015-07-26 ENCOUNTER — Encounter: Payer: Self-pay | Admitting: Hematology and Oncology

## 2015-07-26 ENCOUNTER — Other Ambulatory Visit (HOSPITAL_BASED_OUTPATIENT_CLINIC_OR_DEPARTMENT_OTHER): Payer: PRIVATE HEALTH INSURANCE

## 2015-07-26 ENCOUNTER — Ambulatory Visit (HOSPITAL_BASED_OUTPATIENT_CLINIC_OR_DEPARTMENT_OTHER): Payer: PRIVATE HEALTH INSURANCE | Admitting: Hematology and Oncology

## 2015-07-26 VITALS — BP 137/81 | HR 82 | Temp 98.3°F | Resp 18 | Wt 150.0 lb

## 2015-07-26 DIAGNOSIS — C50412 Malignant neoplasm of upper-outer quadrant of left female breast: Secondary | ICD-10-CM

## 2015-07-26 DIAGNOSIS — Z006 Encounter for examination for normal comparison and control in clinical research program: Secondary | ICD-10-CM

## 2015-07-26 DIAGNOSIS — Z170 Estrogen receptor positive status [ER+]: Secondary | ICD-10-CM

## 2015-07-26 LAB — COMPREHENSIVE METABOLIC PANEL
ALT: 22 U/L (ref 0–55)
ANION GAP: 6 meq/L (ref 3–11)
AST: 20 U/L (ref 5–34)
Albumin: 4.1 g/dL (ref 3.5–5.0)
Alkaline Phosphatase: 73 U/L (ref 40–150)
BUN: 9.3 mg/dL (ref 7.0–26.0)
CHLORIDE: 105 meq/L (ref 98–109)
CO2: 29 meq/L (ref 22–29)
CREATININE: 0.6 mg/dL (ref 0.6–1.1)
Calcium: 9.4 mg/dL (ref 8.4–10.4)
EGFR: >90 mL/min/{1.73_m2} (ref >90)
GLUCOSE: 93 mg/dL (ref 70–140)
Potassium: 3.9 mEq/L (ref 3.5–5.1)
SODIUM: 140 meq/L (ref 136–145)
Total Bilirubin: 0.65 mg/dL (ref 0.20–1.20)
Total Protein: 7.6 g/dL (ref 6.4–8.3)

## 2015-07-26 LAB — CBC WITH DIFFERENTIAL/PLATELET
BASO%: 0.6 % (ref 0.0–2.0)
BASOS ABS: 0 10*3/uL (ref 0.0–0.1)
EOS ABS: 0.1 10*3/uL (ref 0.0–0.5)
EOS%: 1.3 % (ref 0.0–7.0)
HCT: 38.5 % (ref 34.8–46.6)
HGB: 13 g/dL (ref 11.6–15.9)
LYMPH%: 49.4 % (ref 14.0–49.7)
MCH: 33.1 pg (ref 25.1–34.0)
MCHC: 33.8 g/dL (ref 31.5–36.0)
MCV: 98.1 fL (ref 79.5–101.0)
MONO#: 0.5 10*3/uL (ref 0.1–0.9)
MONO%: 9.9 % (ref 0.0–14.0)
NEUT#: 2 10*3/uL (ref 1.5–6.5)
NEUT%: 38.8 % (ref 38.4–76.8)
Platelets: 155 10*3/uL (ref 145–400)
RBC: 3.93 10*6/uL (ref 3.70–5.45)
RDW: 13.1 % (ref 11.2–14.5)
WBC: 5.3 10*3/uL (ref 3.9–10.3)
lymph#: 2.6 10*3/uL (ref 0.9–3.3)

## 2015-07-26 LAB — RESEARCH LABS

## 2015-07-26 MED ORDER — INV-ASPIRIN/PLACEBO 300 MG TABS ALLIANCE A011502: 1.0000 | ORAL_TABLET | Freq: Every day | ORAL | Status: DC

## 2015-07-26 NOTE — Progress Notes (Signed)
Patient Care Team: Chipper Herb, MD as PCP - General (Family Medicine) Nicholas Lose, MD as Consulting Physician (Hematology and Oncology) Coralie Keens, MD as Consulting Physician (General Surgery) Kyung Rudd, MD as Consulting Physician (Radiation Oncology) Sylvan Cheese, NP as Nurse Practitioner (Hematology and Oncology)  DIAGNOSIS: Breast cancer of upper-outer quadrant of left female breast [facility])   Staging form: Breast, AJCC 7th Edition     Clinical stage from 08/01/2014: Stage IIA (T2, N0, M0) - Unsigned     Pathologic stage from 08/08/2014: Stage IIB (T2, N1c, cM0) - Unsigned   SUMMARY OF ONCOLOGIC HISTORY:   Breast cancer of upper-outer quadrant of left female breast ([place])   07/18/2014 Mammogram Distortion left breast, breast density category C; U/S 1.3 x 0.8 x 0.8 cm left breast mass at 2:00 position 4 cm from nipple, no lymph nodes   07/20/2014 Initial Diagnosis Left breast Biopsy: Invasive lobular cancer with LCIS, Grade 1, ER 86%, PR 78%, Her 2 Neg Ratio 1.77, Ki 67: 14%   08/01/2014 Breast MRI Breast MRI showed non-mass enhancement 3.9 cm, no lymph nodes   08/01/2014 Clinical Stage Stage IIA: T2 N0   08/08/2014 Surgery Left breast lumpectomy: 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, with LCIS, medial and inferior margins positive, 3/4 lymph nodes positive   08/08/2014 Pathologic Stage Stage IIB: T2 N1c M0   08/16/2014 Surgery Left breast medial margin reexcision residual ILC 0.2 cm; inferior margin residual foci less than 0.2 cm, 1/5 lymph nodes positive, 1 lymph node with isolated tumor cells (Overall 4/10)   09/04/2014 - 01/15/2015 Chemotherapy Adjuvant chemotherapy with dose dense Adriamycin and Cytoxan 4 followed by Abraxane weekly 8 ( stopped early for profound neutropenia and thrombocytopenia)   01/29/2015 - 03/20/2015 Radiation Therapy Adjuvant XRT [facility]): 50.4 Gy over 28 fractions; seroma boost: 10 Gy over 5 fractions. Total dose: 60.4 Gy   04/11/2015 -   Anti-estrogen oral therapy Anastrozole 1 mg daily (PALLAS clinical trial 05/24/2015 patient was randomized to hormone therapy alone)   05/24/2015 Survivorship Survivorship visit completed and copy of care plan given to patient    CHIEF COMPLIANT: Follow-up on anastrozole, patient is here to enroll in ABC clinical trial  INTERVAL HISTORY: [name] is a 55 year old with above-mentioned history of left breast cancer what undergone lumpectomy followed by adjuvant chemotherapy and radiation and is currently on anastrozole as part of PALLAS clinical trial, where she is on the standard therapy arm. She is here today to discuss starting ABC clinical trial using aspirin versus placebo. She reports no major problems or concerns. 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 She had musculoskeletal aches and pains for which she had seen a chiropractor who helped her significantly. She also received massage therapy both of which have helped her. She no longer has myalgias in her feet or her lower back or any stiffness. She is very excited about planning to go to [place] in June. Patient does not have any problems with regards to bruising and bleeding or epistaxis or hemorrhage. There are no contraindications to aspirin therapy. She had a mammogram today which was normal.  REVIEW OF SYSTEMS:   Constitutional: Denies fevers, chills or abnormal weight loss Eyes: Denies blurriness of vision Ears, nose, mouth, throat, and face: Denies mucositis or sore throat Respiratory: Denies cough, dyspnea or wheezes Cardiovascular: Denies palpitation, chest discomfort Gastrointestinal:  Denies nausea, heartburn or change in bowel habits Skin: Denies abnormal skin rashes Lymphatics: Denies new lymphadenopathy or  easy bruising Neurological:Denies numbness, tingling or new weaknesses Behavioral/Psych: Mood is stable, no new changes  Extremities: No lower extremity edema Breast:  denies any pain or  lumps or nodules in either breasts All other systems were reviewed with the patient and are negative.  I have reviewed the past medical history, past surgical history, social history and family history with the patient and they are unchanged from previous note.  ALLERGIES:  is allergic to crestor; lipitor; and temazepam.  MEDICATIONS:  Current Outpatient Prescriptions  Medication Sig Dispense Refill  . acetaminophen (TYLENOL) 325 MG tablet Take 975 mg by mouth daily as needed for moderate pain. Usually takes twice daily in morning and bedtime and additionally during the day if needed for pain    . anastrozole (ARIMIDEX) 1 MG tablet Take 1 tablet (1 mg total) by mouth daily. 30 tablet 12  . benzonatate (TESSALON) 100 MG capsule TAKE 1 CAPSULE BY MOUTH 3 TIMES A DAY AS NEEDED FOR COUGH FOR UP TO 7 DAYS  0  . clonazePAM (KLONOPIN) 0.5 MG tablet Take 1 tablet (0.5 mg total) by mouth 3 (three) times daily as needed. 90 tablet 5  . pantoprazole (PROTONIX) 20 MG tablet Take 20 mg by mouth daily. Estimated start date    . PARoxetine (PAXIL-CR) 25 MG 24 hr tablet Take 1 tablet (25 mg total) by mouth daily. 30 tablet 11  . PARoxetine (PAXIL-CR) 25 MG 24 hr tablet TAKE 1 TABLET BY MOUTH EVERY DAY 30 tablet 1  . Vitamin D, Ergocalciferol, (DRISDOL) 50000 units CAPS capsule TAKE 1 CAPSULE (50,000 UNITS TOTAL) BY MOUTH EVERY 7 (SEVEN) DAYS. 12 capsule 0   No current facility-administered medications for this visit.    PHYSICAL EXAMINATION: ECOG PERFORMANCE STATUS: 0 - Asymptomatic  Filed Vitals:   07/26/15 1207  BP: 137/81  Pulse: 82  Temp: 98.3 F (36.8 C)  Resp: 18   Filed Weights   07/26/15 1207  Weight: 150 lb (68.04 kg)    GENERAL:alert, no distress and comfortable SKIN: skin color, texture, turgor are normal, no rashes or significant lesions EYES: normal, Conjunctiva are pink and non-injected, sclera clear OROPHARYNX:no exudate, no erythema and lips, buccal mucosa, and tongue normal    NECK: supple, thyroid normal size, non-tender, without nodularity LYMPH:  no palpable lymphadenopathy in the cervical, axillary or inguinal LUNGS: clear to auscultation and percussion with normal breathing effort HEART: regular rate & rhythm and no murmurs and no lower extremity edema ABDOMEN:abdomen soft, non-tender and normal bowel sounds MUSCULOSKELETAL:no cyanosis of digits and no clubbing  NEURO: alert & oriented x 3 with fluent speech, no focal motor/sensory deficits EXTREMITIES: No lower extremity edema  LABORATORY DATA:  I have reviewed the data as listed   Chemistry      Component Value Date/Time   NA 140 06/27/2015 0803   NA 132* 09/09/2014 2336   NA 141 07/21/2013 1023   K 4.3 06/27/2015 0803   K 3.6 09/09/2014 2336   CL 99* 09/09/2014 2336   CO2 25 06/27/2015 0803   CO2 25 09/09/2014 2336   BUN 17.0 06/27/2015 0803   BUN 15 09/09/2014 2336   BUN 14 07/21/2013 1023   CREATININE 0.6 06/27/2015 0803   CREATININE 0.43* 09/09/2014 2336   CREATININE 0.54 10/04/2012 1123      Component Value Date/Time   CALCIUM 9.5 06/27/2015 0803   CALCIUM 8.2* 09/09/2014 2336   ALKPHOS 67 06/27/2015 0803   ALKPHOS 70 09/09/2014 2336   AST 19 06/27/2015 0803  AST 19 09/09/2014 2336   ALT 19 06/27/2015 0803   ALT 26 09/09/2014 2336   BILITOT 0.55 06/27/2015 0803   BILITOT 0.8 09/09/2014 2336       Lab Results  Component Value Date   WBC 5.3 07/26/2015   HGB 13.0 07/26/2015   HCT 38.5 07/26/2015   MCV 98.1 07/26/2015   PLT 155 07/26/2015   NEUTROABS 2.0 07/26/2015   ASSESSMENT & PLAN:  Breast cancer of upper-outer quadrant of left female breast Left breast lumpectomy 08/08/2014: 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, with LCIS, medial and inferior margins positive, 3/4 lymph nodes positive T2 N1 cM0 stage IIB, ER 86%, PR 78%, HER-2/neu negative ratio 1.77, Ki-67 14% Left breast medial margin reexcision 08/18/14: residual ILC 0.2 cm; inferior margin residual foci less  than 0.2 cm, 1/5 lymph nodes positive, 1 lymph node with isolated tumor cells (Overall 4/10)  Treatment Summary: 1. Adjuvant chemotherapy with dose dense Adriamycin and Cytoxan x 4 followed by Abraxane weekly 12 because of lymph node positive disease. Started 09/04/14 to 01/15/15 2. adjuvant radiation completed 03/20/15 3. Followed by antiestrogen therapy started 04/03/15 -------------------------------------------------------------------------------------------------------------------------- Anastrozole toxicities: Patient denies any hot flashes or myalgias. PALLAS Trial : Patient has been randomized to anastrozole alone. ABC clinical trial: The trial randomizes between aspirin versus placebo. She is starting on the trial today.   Cognitive dysfunction: Patient is interested in the clinical trial that we plan to open her cognitive dysfunction. We will evaluate her for that. In the meantime I encouraged her to stay active with reading books and doing puzzles.  Parotid nodules: Biopsy-proven to be lymphoid infiltrates. No malignant cells seen I recommended obtaining a bone density test in addition to mammogram at [facility]. Her mammogram done 07/26/2015 is normal.  Patient plans to go to [place] for a vacation. Return to clinic In 3 months for follow-up   No orders of the defined types were placed in this encounter.   The patient has a good understanding of the overall plan. she agrees with it. she will call with any problems that may develop before the next visit here.   Rulon Eisenmenger, MD 07/26/2015

## 2015-07-26 NOTE — Assessment & Plan Note (Signed)
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 Adjuvant chemotherapy with dose dense Adriamycin and Cytoxan x 4 followed by Abraxane weekly 12 because of lymph node positive disease. Started 09/04/14 to 01/15/15 2. adjuvant radiation completed 03/20/15 3. Followed by antiestrogen therapy started 04/03/15 -------------------------------------------------------------------------------------------------------------------------- Anastrozole toxicities: Patient denies any hot flashes or myalgias. PALLAS Trial : Patient has been randomized to anastrozole alone. Evaluation for ABC clinical trial: The trial randomizes between aspirin versus placebo. Patient is signed the consent form today. She understands the risks and benefits of aspirin therapy.  Parotid nodules: Biopsy-proven to be lymphoid infiltrates. No malignant cells seen I recommended obtaining a bone density test in addition to mammogram at [place]  Return to clinic

## 2015-07-26 NOTE — Progress Notes (Signed)
07/26/2015 1215 PALLAS study and ABC study  The patient comes to clinic today, accompanied by her Aunt, for lab and MD visit.  Blood and urine samples were obtained.  Questionnaires were completed.  PALLAS study She is here for the 3rd cycle visit on the PALLAS study.  She continues on anastrozole. Patient denies any hot flashes or myalgias.   She completed the PROs and adherence questionnaires as required.  Drug diary for cycle 2 was not collected because the patient forgot to bring it in.  A pre-addressed, postage-paid envelope (for return of diary) was given to the patient and she confirmed she would mail diary in.  She states she has not missed taking any doses.  The new diaries for cycles 3-5 were given to the patient.    [facility] ABC study AE baseline checklist was completed for timeframe 06/27/15 through present.  She denies any bruising, dyspepsia, gastritis, stomach pain, headache or bleeding from any area.  She denies taking any aspirin or medications containing NSAIDs.   She took Best boy for cough 06/04/15 through 06/11/15 for pneumonia.  She states she developed pneumonia again on 07/15/15 and [facility] [place].  She does not remember who she saw or what medication she took.  Dr. Lindi Adie was aware of this when he assessed her lungs.  MD cleared patient to begin the ABC study.   Medication diaries for May through Nov. 2017 were provided to the patient.   Study medication (aspirin/placebo) was checked and confirmed by pharmacist, Henreitta Leber.   ASA/placebo bottles (2) were given to the patient.  The instructions on the bag containing the study drug was reviewed with the patient.  She understands to take once a day with food or a full glass of water.  She understands to complete medication diary as directed.  She denied any questions.  She was also given a copy of the consent form for the ABC study that she signed on 06/27/15.  Based on lab results review and physical  exam, Dr. Lindi Adie cleared the patient to continue with PALLAS trial and to begin the [facility] ABC study.  The patient acknowledged reasons to call.  She understands to report bleeding or other side effects discussed with MD.

## 2015-07-31 ENCOUNTER — Encounter: Payer: Self-pay | Admitting: *Deleted

## 2015-07-31 NOTE — Progress Notes (Signed)
Received bone density report from [facility], reviewed by Dr. Lindi Adie, sent to scan.

## 2015-08-01 ENCOUNTER — Other Ambulatory Visit: Payer: PRIVATE HEALTH INSURANCE

## 2015-08-01 DIAGNOSIS — Z1211 Encounter for screening for malignant neoplasm of colon: Secondary | ICD-10-CM

## 2015-08-02 LAB — FECAL OCCULT BLOOD, IMMUNOCHEMICAL: Fecal Occult Bld: NEGATIVE

## 2015-08-07 ENCOUNTER — Other Ambulatory Visit: Payer: Self-pay | Admitting: *Deleted

## 2015-08-07 ENCOUNTER — Other Ambulatory Visit: Payer: Self-pay | Admitting: Family Medicine

## 2015-08-07 DIAGNOSIS — C50412 Malignant neoplasm of upper-outer quadrant of left female breast: Secondary | ICD-10-CM

## 2015-08-07 NOTE — Telephone Encounter (Signed)
Patient last seen February 2017

## 2015-08-08 ENCOUNTER — Telehealth: Payer: Self-pay | Admitting: Hematology and Oncology

## 2015-08-08 NOTE — Telephone Encounter (Signed)
cld & spoke to pt and gave pt time & date of appt for 8/9'@8'$ :00am

## 2015-08-30 ENCOUNTER — Other Ambulatory Visit: Payer: Self-pay | Admitting: Nurse Practitioner

## 2015-08-30 ENCOUNTER — Encounter: Payer: Self-pay | Admitting: Family Medicine

## 2015-09-04 ENCOUNTER — Encounter: Payer: Self-pay | Admitting: Hematology and Oncology

## 2015-09-06 ENCOUNTER — Telehealth: Payer: Self-pay | Admitting: *Deleted

## 2015-09-06 ENCOUNTER — Encounter: Payer: Self-pay | Admitting: *Deleted

## 2015-09-06 NOTE — Telephone Encounter (Signed)
09/06/2015 Spoke with patient by phone today, following her return from vacation earlier this month. The following topics were discussed:  Items to be returned by mail: PALLAS Cycle 2 diary - patient forgot to return at her clinic visit in May. She was given an envelope to mail the completed diaries in. Patient states she will get them in the mail; requested that she send them out by this weekend. [facility] authorization - patient was unable to recall medication prescribed by [facility]. An authorization form allowing [place] to access the [facility] records via [place] was mailed to the patient on 08/09/2015, along with a return envelope. Requested that patient sign the authorization and return as soon as possible.  Upcoming appointments: Patient was notified by scheduler of August appointments, She states she has them on her calendar, but requested that a copy by mailed to her.  [facility] [phone] ABC study: Patient states she is taking medication as prescribed and recording doses on her medication diary. She believes she is on the study medication (aspirin), because she noted bruising of her thighs while on vacation, after she spent two hours on horseback.  [id number] Cognitive Impairment study: Explained to patient that she would not be eligible until after 01/01/2016 (one year after completion of treatment). Patient was given a brief description of the study. Study materials will be given to her at her August appointment for review. If she chooses to participate, we can work to coordinate this with her November appointments.  Copy of patient appointments and an extra postage-paid return envelope for the Clinical Research Department will be mailed to the patient today by the Research Assistant.  [name] BSN, RN, CCRP 09/06/2015 11:35 AM

## 2015-09-17 ENCOUNTER — Telehealth: Payer: Self-pay | Admitting: *Deleted

## 2015-09-17 NOTE — Telephone Encounter (Signed)
09/17/2015 Spoke with patient by phone to inquire about documents requestedon 09/06/2015 to be mailed by patient. She states she "has the paperwork right here", but has not had time to mail them yet. Patient states she will plan to go by the post office today or tomorrow to mail them. [name] BSN, RN, CCRP 09/17/2015 11:15 AM

## 2015-09-19 ENCOUNTER — Telehealth: Payer: Self-pay | Admitting: *Deleted

## 2015-09-19 NOTE — Telephone Encounter (Signed)
09/19/2015 Received phone call from patient stating that she is going to post office now to mail her PALLAS drug diary, as well as completed diaries for the ABC study. She states she does not have the release form for [facility] (authorization); will obtain another copy and mail to patient for signature. [name] BSN, RN, [place] 09/19/2015 3:05 PM

## 2015-10-04 ENCOUNTER — Other Ambulatory Visit: Payer: Self-pay | Admitting: Family Medicine

## 2015-10-16 NOTE — Assessment & Plan Note (Signed)
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 Adjuvant chemotherapy with dose dense Adriamycin and Cytoxan x 4 followed by Abraxane weekly 12 because of lymph node positive disease. Started 09/04/14 to 01/15/15 2. adjuvant radiation completed 03/20/15 3. Followed by antiestrogen therapy started 04/03/15 -------------------------------------------------------------------------------------------------------------------------- Anastrozole toxicities: Patient denies any hot flashes or myalgias. PALLAS Trial : Patient has been randomized to anastrozole alone. ABC clinical trial: The trial randomizes between aspirin versus placebo. She went on trial 07/26/2015   Cognitive dysfunction: Patient is interested in the clinical trial that we plan to open her cognitive dysfunction. We will evaluate her for that. In the meantime I encouraged her to stay active with reading books and doing puzzles.  Parotid nodules: Biopsy-proven to be lymphoid infiltrates. No malignant cells seen I recommended obtaining a bone density test in addition to mammogram at [facility]. Her mammogram done 07/26/2015 is normal.  Patient plans to go to [place] for a vacation. Return to clinic in 3 months for follow-up

## 2015-10-17 ENCOUNTER — Other Ambulatory Visit (HOSPITAL_BASED_OUTPATIENT_CLINIC_OR_DEPARTMENT_OTHER): Payer: PRIVATE HEALTH INSURANCE

## 2015-10-17 ENCOUNTER — Encounter: Payer: Self-pay | Admitting: *Deleted

## 2015-10-17 ENCOUNTER — Telehealth: Payer: Self-pay | Admitting: Hematology and Oncology

## 2015-10-17 ENCOUNTER — Encounter: Payer: Self-pay | Admitting: Hematology and Oncology

## 2015-10-17 ENCOUNTER — Encounter: Payer: PRIVATE HEALTH INSURANCE | Admitting: *Deleted

## 2015-10-17 ENCOUNTER — Ambulatory Visit (HOSPITAL_BASED_OUTPATIENT_CLINIC_OR_DEPARTMENT_OTHER): Payer: PRIVATE HEALTH INSURANCE | Admitting: Hematology and Oncology

## 2015-10-17 DIAGNOSIS — Z006 Encounter for examination for normal comparison and control in clinical research program: Secondary | ICD-10-CM

## 2015-10-17 DIAGNOSIS — C50412 Malignant neoplasm of upper-outer quadrant of left female breast: Secondary | ICD-10-CM | POA: Diagnosis not present

## 2015-10-17 DIAGNOSIS — Z170 Estrogen receptor positive status [ER+]: Secondary | ICD-10-CM | POA: Diagnosis not present

## 2015-10-17 DIAGNOSIS — Z79811 Long term (current) use of aromatase inhibitors: Secondary | ICD-10-CM

## 2015-10-17 LAB — CBC WITH DIFFERENTIAL/PLATELET
BASO%: 0.6 % (ref 0.0–2.0)
Basophils Absolute: 0 10*3/uL (ref 0.0–0.1)
EOS ABS: 0.1 10*3/uL (ref 0.0–0.5)
EOS%: 1.7 % (ref 0.0–7.0)
HCT: 39.5 % (ref 34.8–46.6)
HEMOGLOBIN: 13.3 g/dL (ref 11.6–15.9)
LYMPH#: 2.4 10*3/uL (ref 0.9–3.3)
LYMPH%: 46.9 % (ref 14.0–49.7)
MCH: 32.6 pg (ref 25.1–34.0)
MCHC: 33.7 g/dL (ref 31.5–36.0)
MCV: 96.7 fL (ref 79.5–101.0)
MONO#: 0.5 10*3/uL (ref 0.1–0.9)
MONO%: 10.4 % (ref 0.0–14.0)
NEUT%: 40.4 % (ref 38.4–76.8)
NEUTROS ABS: 2.1 10*3/uL (ref 1.5–6.5)
Platelets: 168 10*3/uL (ref 145–400)
RBC: 4.09 10*6/uL (ref 3.70–5.45)
RDW: 12.8 % (ref 11.2–14.5)
WBC: 5.2 10*3/uL (ref 3.9–10.3)

## 2015-10-17 LAB — COMPREHENSIVE METABOLIC PANEL
ALBUMIN: 4.1 g/dL (ref 3.5–5.0)
ALT: 28 U/L (ref 0–55)
AST: 22 U/L (ref 5–34)
Alkaline Phosphatase: 78 U/L (ref 40–150)
Anion Gap: 9 mEq/L (ref 3–11)
BUN: 10.1 mg/dL (ref 7.0–26.0)
CHLORIDE: 106 meq/L (ref 98–109)
CO2: 24 meq/L (ref 22–29)
Calcium: 9.3 mg/dL (ref 8.4–10.4)
Creatinine: 0.6 mg/dL (ref 0.6–1.1)
GLUCOSE: 105 mg/dL (ref 70–140)
POTASSIUM: 4.1 meq/L (ref 3.5–5.1)
SODIUM: 140 meq/L (ref 136–145)
Total Bilirubin: 0.41 mg/dL (ref 0.20–1.20)
Total Protein: 7.7 g/dL (ref 6.4–8.3)

## 2015-10-17 LAB — RESEARCH LABS

## 2015-10-17 MED ORDER — ANASTROZOLE 1 MG PO TABS: 1.0000 mg | ORAL_TABLET | Freq: Every day | ORAL | 3 refills | Status: DC

## 2015-10-17 NOTE — Progress Notes (Signed)
10/17/2015 Patient in to clinic today for routine visit at start of PALLAS treatment Cycle 6 (to begin tomorrow). Upon arrival to the clinic, PRO and Adherence questionnaires were completed independently by the patient. Cycle 3 drug diary was previously returned via mail. Patient returned her completed diary for Cycle 4 indicating no doses missed. Cycle 5 drug diary was returned today, however, since patient has one remaining dose to record for this evening, a copy of the diary was retained on site. The original was returned to the patient, along with a postage-paid return envelope for the [place] Department. Patient is to mail the completed diary within the next week. Patient was given diaries for Cycles 6-8, to begin recording doses tomorrow. Patient medication list was updated with current supplements that she is taking, as recommended or approved by Dr. Lindi Adie. Patient complains of weight gain since her diagnosis, however, compared to study baseline, patient has not quite met the threshold for a grade 1 weight gain while on study. Will continue to monitor. Next study visit at Cycle 9 is expected January 09, 2016. [name] BSN, RN, [place] 10/17/2015 11:41 AM   Adverse Event Log - Cycles 3-5 Event Grade Onset Date Resolved Date Attribution to anastrozole Treatment Comments  Dizziness, intermittent Grade 1 1991 ongoing N/A - baseline Klonopin for prophylaxis   Insomnia Grade 1 1997 ongoing  N/A - baseline None   Anxiety  Grade 1 1987 ongoing  N/A - baseline Paxil .  Hiccups, intermittent Grade 1 09/2014 ongoing N/A - baseline None Started during chemo, no treatment required.  Pain (hips to lower legs), intermittent Grade 2 09/2014 ongoing N/A - baseline Tylenol Started during chemo, estimated start date.  Memory impairment Grade 1 07/2015 ongoing No None   Musculoskeletal aches Grade 2 07/2015 ongoing Yes None Improved with chiropractic intervention  Bruising Grade 1 08/25/2015 ongoing No None    Muscle stiffness Grade 2 08/23/2015 ongoing Yes None Improves after patient gets up and moves around.  Hypertension Grade 2 10/17/2015 ongoing No None

## 2015-10-17 NOTE — Progress Notes (Signed)
10/17/2015 After repeated attempts to retrieve patient's completed Cycle 2 diary, it was determined that the diary had likely been lost in the mail. At Cycle 3 visit on 07/26/2015, patient forgot to bring the diary to the clinic visit. Patient was given instruction by the research nurse, Marcellus Scott, to return the completed form by mail. In addition, since patient's cycle was extended by one day, due to rescheduling of appointment, patient was instructed to write the [id number] dosing in at the bottom of the diary. Patient was also given a pre-addressed, postage-paid envelope to mail the completed Cycle 2 diary in. Patient was contacted by phone on 09/06/2015, following her return from vacation, with a reminder to mail in the completed form. Patient did mail other documents, which were received on 09/25/2015, however the Cycle 2 diary was not included. Letter sent to patient on 09/25/2015 noting need for Cycle 2 diary. Per patient, she did mail the diary following the 07/26/2015 visit, however, it was never received by the research department. Per verbal report, patient states she did not miss any doses of her anastrozole, and has not missed any since beginning treatment.  [name] BSN, RN, CCRP 10/17/2015 11:18 AM

## 2015-10-17 NOTE — Telephone Encounter (Signed)
appt made and avs printed °

## 2015-10-17 NOTE — Progress Notes (Signed)
Patient Care Team: Chipper Herb, MD as PCP - General (Family Medicine) Nicholas Lose, MD as Consulting Physician (Hematology and Oncology) Coralie Keens, MD as Consulting Physician (General Surgery) Kyung Rudd, MD as Consulting Physician (Radiation Oncology) Sylvan Cheese, NP as Nurse Practitioner (Hematology and Oncology)  DIAGNOSIS: Breast cancer of upper-outer quadrant of left female breast [facility])   Staging form: Breast, AJCC 7th Edition   - Clinical stage from 08/01/2014: Stage IIA (T2, N0, M0) - Unsigned   - Pathologic stage from 08/08/2014: Stage IIB (T2, N1c, cM0) - Unsigned  SUMMARY OF ONCOLOGIC HISTORY:   Breast cancer of upper-outer quadrant of left female breast ([place])   07/18/2014 Mammogram    Distortion left breast, breast density category C; U/S 1.3 x 0.8 x 0.8 cm left breast mass at 2:00 position 4 cm from nipple, no lymph nodes     07/20/2014 Initial Diagnosis    Left breast Biopsy: Invasive lobular cancer with LCIS, Grade 1, ER 86%, PR 78%, Her 2 Neg Ratio 1.77, Ki 67: 14%     08/01/2014 Breast MRI    Breast MRI showed non-mass enhancement 3.9 cm, no lymph nodes     08/01/2014 Clinical Stage    Stage IIA: T2 N0     08/08/2014 Surgery    Left breast lumpectomy: 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, with LCIS, medial and inferior margins positive, 3/4 lymph nodes positive     08/08/2014 Pathologic Stage    Stage IIB: T2 N1c M0     08/16/2014 Surgery    Left breast medial margin reexcision residual ILC 0.2 cm; inferior margin residual foci less than 0.2 cm, 1/5 lymph nodes positive, 1 lymph node with isolated tumor cells (Overall 4/10)     09/04/2014 - 01/15/2015 Chemotherapy    Adjuvant chemotherapy with dose dense Adriamycin and Cytoxan 4 followed by Abraxane weekly 8 ( stopped early for profound neutropenia and thrombocytopenia)     01/29/2015 - 03/20/2015 Radiation Therapy    Adjuvant XRT [facility]): 50.4 Gy over 28 fractions; seroma boost: 10 Gy over  5 fractions. Total dose: 60.4 Gy     04/11/2015 -  Anti-estrogen oral therapy    Anastrozole 1 mg daily (PALLAS clinical trial 05/24/2015 patient was randomized to hormone therapy alone)     05/24/2015 Survivorship    Survivorship visit completed and copy of care plan given to patient      CHIEF COMPLIANT: Follow-up on anastrozole  INTERVAL HISTORY: [name] is a 55 year old with above-mentioned history of left breast cancer treated with lumpectomy followed by adjuvant chemotherapy and radiation. She is currently on antiestrogen therapy with anastrozole. She is on PALLAS and ABC trials. She reports no major problems tolerating anastrozole. She does have very occasional muscle stiffness especially in the morning which she stretches and it gets better. She has noticed occasional bruising.  REVIEW OF SYSTEMS:   Constitutional: Denies fevers, chills or abnormal weight loss Eyes: Denies blurriness of vision Ears, nose, mouth, throat, and face: Denies mucositis or sore throat Respiratory: Denies cough, dyspnea or wheezes Cardiovascular: Denies palpitation, chest discomfort Gastrointestinal:  Denies nausea, heartburn or change in bowel habits Skin:  easy bruising Lymphatics: Denies new lymphadenopathy or easy bruising Neurological:Denies numbness, tingling or new weaknesses Behavioral/Psych: Mood is stable, no new changes  Extremities: No lower extremity edema Breast:  denies any pain or lumps or nodules in either breasts All other systems were reviewed with the patient and are negative.  I have reviewed the  past medical history, past surgical history, social history and family history with the patient and they are unchanged from previous note.  ALLERGIES:  is allergic to crestor [rosuvastatin]; lipitor [atorvastatin]; and temazepam.  MEDICATIONS:  Current Outpatient Prescriptions  Medication Sig Dispense Refill  . acetaminophen (TYLENOL) 325 MG tablet Take 975 mg by mouth daily as  needed for moderate pain. Usually takes twice daily in morning and bedtime and additionally during the day if needed for pain    . anastrozole (ARIMIDEX) 1 MG tablet Take 1 tablet (1 mg total) by mouth daily. 30 tablet 12  . clonazePAM (KLONOPIN) 0.5 MG tablet Take 1 tablet (0.5 mg total) by mouth 3 (three) times daily as needed. 90 tablet 5  . Investigational aspirin/placebo 300 MG tablet Alliance [id number] Take 1 tablet by mouth daily. Take with food or a full glass of water.  Do not crush enteric coated tablets. 180 tablet 0  . levofloxacin (LEVAQUIN) 750 MG tablet Take 750 mg by mouth daily.  0  . pantoprazole (PROTONIX) 20 MG tablet Take 20 mg by mouth daily. Estimated start date    . PARoxetine (PAXIL-CR) 25 MG 24 hr tablet Take 1 tablet (25 mg total) by mouth daily. 30 tablet 11  . PARoxetine (PAXIL-CR) 25 MG 24 hr tablet TAKE 1 TABLET BY MOUTH EVERY DAY 30 tablet 1  . Vitamin D, Ergocalciferol, (DRISDOL) 50000 units CAPS capsule TAKE 1 CAPSULE (50,000 UNITS TOTAL) BY MOUTH EVERY 7 (SEVEN) DAYS. 12 capsule 0   No current facility-administered medications for this visit.     PHYSICAL EXAMINATION: ECOG PERFORMANCE STATUS: 0 - Asymptomatic  Vitals:   10/17/15 0801  BP: (!) 140/92  Pulse: 85  Resp: 18  Temp: 99.3 F (37.4 C)   Filed Weights   10/17/15 0801  Weight: 156 lb (70.8 kg)    GENERAL:alert, no distress and comfortable SKIN: Easy bruising EYES: normal, Conjunctiva are pink and non-injected, sclera clear OROPHARYNX:no exudate, no erythema and lips, buccal mucosa, and tongue normal  NECK: supple, thyroid normal size, non-tender, without nodularity LYMPH:  no palpable lymphadenopathy in the cervical, axillary or inguinal LUNGS: clear to auscultation and percussion with normal breathing effort HEART: regular rate & rhythm and no murmurs and no lower extremity edema ABDOMEN:abdomen soft, non-tender and normal bowel sounds MUSCULOSKELETAL:no cyanosis of digits and no clubbing   NEURO: alert & oriented x 3 with fluent speech, no focal motor/sensory deficits EXTREMITIES: No lower extremity edema   LABORATORY DATA:  I have reviewed the data as listed   Chemistry      Component Value Date/Time   NA 140 07/26/2015 1154   K 3.9 07/26/2015 1154   CL 99 (L) 09/09/2014 2336   CO2 29 07/26/2015 1154   BUN 9.3 07/26/2015 1154   CREATININE 0.6 07/26/2015 1154      Component Value Date/Time   CALCIUM 9.4 07/26/2015 1154   ALKPHOS 73 07/26/2015 1154   AST 20 07/26/2015 1154   ALT 22 07/26/2015 1154   BILITOT 0.65 07/26/2015 1154       Lab Results  Component Value Date   WBC 5.2 10/17/2015   HGB 13.3 10/17/2015   HCT 39.5 10/17/2015   MCV 96.7 10/17/2015   PLT 168 10/17/2015   NEUTROABS 2.1 10/17/2015     ASSESSMENT & PLAN:  Breast cancer of upper-outer quadrant of left female breast Left breast lumpectomy 08/08/2014: 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, with LCIS, medial and inferior margins positive, 3/4 lymph nodes  positive T2 N1 cM0 stage IIB, ER 86%, PR 78%, HER-2/neu negative ratio 1.77, Ki-67 14% Left breast medial margin reexcision 08/18/14: residual ILC 0.2 cm; inferior margin residual foci less than 0.2 cm, 1/5 lymph nodes positive, 1 lymph node with isolated tumor cells (Overall 4/10)  Treatment Summary: 1. Adjuvant chemotherapy with dose dense Adriamycin and Cytoxan x 4 followed by Abraxane weekly 12 because of lymph node positive disease. Started 09/04/14 to 01/15/15 2. adjuvant radiation completed 03/20/15 3. Followed by antiestrogen therapy started 04/03/15 -------------------------------------------------------------------------------------------------------------------------- Anastrozole toxicities: Patient denies any hot flashes. 1. Complains of occasional myalgias 2. Weight gain: I believe the cause the weight gain as her dietary habits and she will work very hard to lose the pounds. I discussed with her that based on the BMI she  is overweight.  Bone density 07/26/2015: T score -1.9, osteopenia  PALLAS Trial : Patient has been randomized to anastrozole alone. ABC clinical trial: The trial randomizes between aspirin versus placebo. She went on trial 07/26/2015  Adverse effects: 1. Occasional bruising  Cognitive dysfunction: Patient is interested in the clinical trial that we plan to open her cognitive dysfunction. We will evaluate her for that When she comes back to see [place] in 3 months.  Parotid nodules: Biopsy-proven to be lymphoid infiltrates. No malignant cells seen  Her mammogram done 07/26/2015 is normal.  Patient plans to go to [place] for a vacation. Return to clinic in 3 months for follow-up   No orders of the defined types were placed in this encounter.  The patient has a good understanding of the overall plan. she agrees with it. she will call with any problems that may develop before the next visit here.   Rulon Eisenmenger, MD 10/17/15

## 2015-10-17 NOTE — Progress Notes (Signed)
10/17/2015 Patient in to clinic today for Cycle 6 PALLAS study visit. Patient had previously mailed her completed medication calendars for May and June for the Alliance [id number] ABC study, which were received on 09/25/2015. Patient brought calendars for July through November, which she will continue to complete and will return at her next office visit. Patient brought bottle #1 of study medication which is almost finished; she will begin taking doses from bottle #2, once the first bottle is completed. Patient instructed to return bottles, whether empty or partial, at clinic visit in November, when she will receive a resupply. Patient verbalized understanding. [name] BSN, RN, CCRP 10/17/2015 10:45 AM

## 2015-10-18 LAB — HEMOGLOBIN A1C
ESTIMATED AVERAGE GLUCOSE: 105 mg/dL
Hemoglobin A1c: 5.3 % (ref 4.8–5.6)

## 2015-10-22 ENCOUNTER — Ambulatory Visit: Payer: PRIVATE HEALTH INSURANCE | Admitting: Family Medicine

## 2015-10-23 ENCOUNTER — Encounter: Payer: Self-pay | Admitting: Family Medicine

## 2015-11-29 NOTE — H&P (Signed)
Subjective:    Patient ID: [name] is a 55 y.o. female.  HPI  Here for follow up breasts, last seen 11.2016. Presented following screening MMG. Underwent with Dr. Ninfa Linden left seed localized partial mastectomy mass left UOQ, Underwent reexcision for margins as well as ALND.   Final pathology: Left breast lumpectomy 08/08/2014: ILC 2.6 cm, with LCIS, medial and inferior margins positive, 3/4 lymph nodes positive T2 N1 cM0 stage IIB, ER/PR +, Her 2 -. Left breast medial margin reexcision 08/18/14: residual ILC 0.2 cm; inferior margin residual foci less than 0.2 cm, 1/5 lymph nodes positive, 1 lymph node with isolated tumor cells (Overall 4/10)  She completed adjuvant chemotherapy followed by adjuvant radiation 03/2015. She is currently on antiestrogen therapy with anastrozole. She is on PALLAS and ABC trials.   Prior to lumpectomy 34C, and now significant asymmetry size. Not using any insert.  Last MMG 07/2015 normal. Will have 6 month follow up on left.  No weight on  Lumpectomy specimens noted.   Review of Systems    Objective:   Physical Exam  Cardiovascular: Normal rate, regular rhythm and normal heart sounds.   Pulmonary/Chest: Effort normal and breath sounds normal.  Genitourinary: No breast discharge.  Genitourinary Comments: Asymmetry volume, R>> L Right grade 1 ptosis, left no ptosis SN to nipple R 25.5 L 21 BW R 16 L 13 cm Nipple to IMF R 7 L 6 cm  Left upper outer quadrant transverse scar, depression with animation, left axilla scar slightly depressed  No masses  Lymphadenopathy:    She has no axillary adenopathy.       Assessment:     Left breast cancer UOQ, s/p lumpectomy, ALND S/p adjuvant chemotherapy , XRT Asymmetry breasts acquired    Plan:        Satisfied with left breast. Plan right breast mastopexy for symmetry. Discussed some breast tissue is removed as part of mastopexy. Reviewed anchor type scars, OP surgery, no drains. Reviewed  that radiated breast will not age/develop ptosis as right breast will and will develop recurrent asymmetry with time.  Discussed additional risks including but not limited to bleeding, hematoma, seroma, wound healing problems, fat necrosis, loss nipple, diminished sensation nipple and breast, DVT/PE.  Patient is in clinical trial, she believes she is receiving study medication aspirin. Counseled if this is indeed true, she would be at higher risk bleeding complications, bruising.   Irene Limbo, MD [facility] Plastic & Reconstructive Surgery [phone], pin [phone]

## 2015-12-03 ENCOUNTER — Encounter (HOSPITAL_BASED_OUTPATIENT_CLINIC_OR_DEPARTMENT_OTHER): Payer: Self-pay | Admitting: *Deleted

## 2015-12-03 ENCOUNTER — Telehealth: Payer: Self-pay | Admitting: Family Medicine

## 2015-12-03 ENCOUNTER — Other Ambulatory Visit: Payer: Self-pay | Admitting: Family Medicine

## 2015-12-03 NOTE — Telephone Encounter (Signed)
Sent to [facility]

## 2015-12-14 ENCOUNTER — Encounter (HOSPITAL_BASED_OUTPATIENT_CLINIC_OR_DEPARTMENT_OTHER)
Admission: RE | Disposition: A | Discharge status: Home / Self Care | Payer: Self-pay | Source: Ambulatory Visit | Attending: Plastic Surgery

## 2015-12-14 ENCOUNTER — Ambulatory Visit (HOSPITAL_BASED_OUTPATIENT_CLINIC_OR_DEPARTMENT_OTHER)
Admission: RE | Admit: 2015-12-14 | Discharge: 2015-12-14 | Disposition: A | Discharge status: Home / Self Care | Payer: PRIVATE HEALTH INSURANCE | Source: Ambulatory Visit | Attending: Plastic Surgery | Admitting: Plastic Surgery

## 2015-12-14 ENCOUNTER — Encounter (HOSPITAL_BASED_OUTPATIENT_CLINIC_OR_DEPARTMENT_OTHER): Payer: Self-pay

## 2015-12-14 ENCOUNTER — Ambulatory Visit (HOSPITAL_BASED_OUTPATIENT_CLINIC_OR_DEPARTMENT_OTHER): Payer: PRIVATE HEALTH INSURANCE | Admitting: Anesthesiology

## 2015-12-14 DIAGNOSIS — Z853 Personal history of malignant neoplasm of breast: Secondary | ICD-10-CM | POA: Insufficient documentation

## 2015-12-14 DIAGNOSIS — N6021 Fibroadenosis of right breast: Secondary | ICD-10-CM | POA: Diagnosis not present

## 2015-12-14 DIAGNOSIS — Z9221 Personal history of antineoplastic chemotherapy: Secondary | ICD-10-CM | POA: Diagnosis not present

## 2015-12-14 DIAGNOSIS — F419 Anxiety disorder, unspecified: Secondary | ICD-10-CM | POA: Diagnosis not present

## 2015-12-14 DIAGNOSIS — N651 Disproportion of reconstructed breast: Secondary | ICD-10-CM | POA: Diagnosis present

## 2015-12-14 DIAGNOSIS — Z923 Personal history of irradiation: Secondary | ICD-10-CM | POA: Diagnosis not present

## 2015-12-14 HISTORY — PX: MASTOPEXY: SHX5358

## 2015-12-14 SURGERY — MASTOPEXY
Anesthesia: General | Site: Breast | Laterality: Right

## 2015-12-14 MED ORDER — MIDAZOLAM HCL 2 MG/2ML IJ SOLN
1.0000 mg | INTRAMUSCULAR | Status: DC | PRN
  Administered 2015-12-14: 2 mg via INTRAVENOUS

## 2015-12-14 MED ORDER — PROMETHAZINE HCL 25 MG/ML IJ SOLN: 6.2500 mg | INTRAMUSCULAR | Status: DC | PRN

## 2015-12-14 MED ORDER — LACTATED RINGERS IV SOLN
INTRAVENOUS | Status: DC
  Administered 2015-12-14 (×2): via INTRAVENOUS

## 2015-12-14 MED ORDER — PROPOFOL 10 MG/ML IV BOLUS
INTRAVENOUS | Status: DC | PRN
  Administered 2015-12-14: 200 mg via INTRAVENOUS

## 2015-12-14 MED ORDER — SCOPOLAMINE 1 MG/3DAYS TD PT72
1.0000 | MEDICATED_PATCH | Freq: Once | TRANSDERMAL | Status: DC | PRN
Start: 2015-12-14 — End: 2015-12-14

## 2015-12-14 MED ORDER — GLYCOPYRROLATE 0.2 MG/ML IJ SOLN: 0.2000 mg | Freq: Once | INTRAMUSCULAR | Status: DC | PRN

## 2015-12-14 MED ORDER — EPHEDRINE SULFATE 50 MG/ML IJ SOLN
INTRAMUSCULAR | Status: DC | PRN
  Administered 2015-12-14: 10 mg via INTRAVENOUS

## 2015-12-14 MED ORDER — CHLORHEXIDINE GLUCONATE CLOTH 2 % EX PADS: 6.0000 | MEDICATED_PAD | Freq: Once | CUTANEOUS | Status: DC

## 2015-12-14 MED ORDER — ONDANSETRON HCL 4 MG/2ML IJ SOLN
INTRAMUSCULAR | Status: DC | PRN
  Administered 2015-12-14: 4 mg via INTRAVENOUS

## 2015-12-14 MED ORDER — FENTANYL CITRATE (PF) 100 MCG/2ML IJ SOLN
50.0000 ug | INTRAMUSCULAR | Status: DC | PRN
  Administered 2015-12-14: 100 ug via INTRAVENOUS

## 2015-12-14 MED ORDER — FENTANYL CITRATE (PF) 100 MCG/2ML IJ SOLN
INTRAMUSCULAR | Status: AC
  Filled 2015-12-14: qty 2

## 2015-12-14 MED ORDER — CEFAZOLIN SODIUM-DEXTROSE 2-4 GM/100ML-% IV SOLN
INTRAVENOUS | Status: AC
  Filled 2015-12-14: qty 100

## 2015-12-14 MED ORDER — ACETAMINOPHEN 500 MG PO TABS
ORAL_TABLET | ORAL | Status: AC
  Filled 2015-12-14: qty 2

## 2015-12-14 MED ORDER — GABAPENTIN 300 MG PO CAPS
ORAL_CAPSULE | ORAL | Status: AC
  Filled 2015-12-14: qty 1

## 2015-12-14 MED ORDER — LIDOCAINE 2% (20 MG/ML) 5 ML SYRINGE
INTRAMUSCULAR | Status: DC | PRN
  Administered 2015-12-14: 50 mg via INTRAVENOUS

## 2015-12-14 MED ORDER — MIDAZOLAM HCL 2 MG/2ML IJ SOLN
INTRAMUSCULAR | Status: AC
  Filled 2015-12-14: qty 2

## 2015-12-14 MED ORDER — HYDROCODONE-ACETAMINOPHEN 5-325 MG PO TABS: 1.0000 | ORAL_TABLET | Freq: Four times a day (QID) | ORAL | 0 refills | Status: DC | PRN

## 2015-12-14 MED ORDER — BUPIVACAINE-EPINEPHRINE 0.25% -1:200000 IJ SOLN
INTRAMUSCULAR | Status: DC | PRN
  Administered 2015-12-14: 20 mL

## 2015-12-14 MED ORDER — DEXAMETHASONE SODIUM PHOSPHATE 4 MG/ML IJ SOLN
INTRAMUSCULAR | Status: DC | PRN
  Administered 2015-12-14: 10 mg via INTRAVENOUS

## 2015-12-14 MED ORDER — CEFAZOLIN SODIUM-DEXTROSE 2-4 GM/100ML-% IV SOLN
2.0000 g | INTRAVENOUS | Status: AC
  Administered 2015-12-14: 2 g via INTRAVENOUS

## 2015-12-14 MED ORDER — GABAPENTIN 300 MG PO CAPS
300.0000 mg | ORAL_CAPSULE | ORAL | Status: AC
  Administered 2015-12-14: 300 mg via ORAL

## 2015-12-14 MED ORDER — ACETAMINOPHEN 500 MG PO TABS
1000.0000 mg | ORAL_TABLET | ORAL | Status: AC
  Administered 2015-12-14: 1000 mg via ORAL

## 2015-12-14 MED ORDER — 0.9 % SODIUM CHLORIDE (POUR BTL) OPTIME
TOPICAL | Status: DC | PRN
  Administered 2015-12-14: 500 mL

## 2015-12-14 MED ORDER — LIDOCAINE-EPINEPHRINE 1 %-1:100000 IJ SOLN
INTRAMUSCULAR | Status: AC
  Filled 2015-12-14: qty 1

## 2015-12-14 MED ORDER — HYDROMORPHONE HCL 1 MG/ML IJ SOLN: 0.2500 mg | INTRAMUSCULAR | Status: DC | PRN

## 2015-12-14 MED ORDER — PROPOFOL 10 MG/ML IV BOLUS
INTRAVENOUS | Status: AC
  Filled 2015-12-14: qty 20

## 2015-12-14 MED ORDER — BUPIVACAINE-EPINEPHRINE (PF) 0.25% -1:200000 IJ SOLN
INTRAMUSCULAR | Status: AC
  Filled 2015-12-14: qty 30

## 2015-12-14 SURGICAL SUPPLY — 59 items
BAG DECANTER FOR FLEXI CONT (MISCELLANEOUS) IMPLANT
BINDER BREAST LRG (GAUZE/BANDAGES/DRESSINGS) ×2 IMPLANT
BINDER BREAST MEDIUM (GAUZE/BANDAGES/DRESSINGS) IMPLANT
BINDER BREAST XLRG (GAUZE/BANDAGES/DRESSINGS) IMPLANT
BINDER BREAST XXLRG (GAUZE/BANDAGES/DRESSINGS) IMPLANT
BLADE SURG 10 STRL SS (BLADE) ×4 IMPLANT
BLADE SURG 15 STRL LF DISP TIS (BLADE) IMPLANT
BLADE SURG 15 STRL SS (BLADE)
BNDG GAUZE ELAST 4 BULKY (GAUZE/BANDAGES/DRESSINGS) ×4 IMPLANT
CANISTER SUCT 1200ML W/VALVE (MISCELLANEOUS) ×2 IMPLANT
CHLORAPREP W/TINT 26ML (MISCELLANEOUS) ×4 IMPLANT
COVER MAYO STAND STRL (DRAPES) ×2 IMPLANT
DECANTER SPIKE VIAL GLASS SM (MISCELLANEOUS) IMPLANT
DRAIN CHANNEL 19F RND (DRAIN) IMPLANT
DRSG PAD ABDOMINAL 8X10 ST (GAUZE/BANDAGES/DRESSINGS) ×4 IMPLANT
DRSG TEGADERM 2-3/8X2-3/4 SM (GAUZE/BANDAGES/DRESSINGS) IMPLANT
ELECT BLADE 4.0 EZ CLEAN MEGAD (MISCELLANEOUS)
ELECT COATED BLADE 2.86 ST (ELECTRODE) ×2 IMPLANT
ELECT REM PT RETURN 9FT ADLT (ELECTROSURGICAL) ×2
ELECTRODE BLDE 4.0 EZ CLN MEGD (MISCELLANEOUS) IMPLANT
ELECTRODE REM PT RTRN 9FT ADLT (ELECTROSURGICAL) ×1 IMPLANT
EVACUATOR SILICONE 100CC (DRAIN) IMPLANT
GAUZE SPONGE 4X4 12PLY STRL (GAUZE/BANDAGES/DRESSINGS) IMPLANT
GLOVE BIO SURGEON STRL SZ 6 (GLOVE) ×4 IMPLANT
GLOVE BIOGEL PI IND STRL 7.0 (GLOVE) ×1 IMPLANT
GLOVE BIOGEL PI IND STRL 8 (GLOVE) ×1 IMPLANT
GLOVE BIOGEL PI INDICATOR 7.0 (GLOVE) ×1
GLOVE BIOGEL PI INDICATOR 8 (GLOVE) ×1
GLOVE SURG SS PI 7.0 STRL IVOR (GLOVE) ×2 IMPLANT
GLOVE SURG SS PI 7.5 STRL IVOR (GLOVE) ×2 IMPLANT
GOWN STRL REUS W/ TWL LRG LVL3 (GOWN DISPOSABLE) ×2 IMPLANT
GOWN STRL REUS W/ TWL XL LVL3 (GOWN DISPOSABLE) ×1 IMPLANT
GOWN STRL REUS W/TWL LRG LVL3 (GOWN DISPOSABLE) ×2
GOWN STRL REUS W/TWL XL LVL3 (GOWN DISPOSABLE) ×1
LIQUID BAND (GAUZE/BANDAGES/DRESSINGS) ×2 IMPLANT
NEEDLE HYPO 25X1 1.5 SAFETY (NEEDLE) ×2 IMPLANT
NS IRRIG 1000ML POUR BTL (IV SOLUTION) ×2 IMPLANT
PACK BASIN DAY SURGERY FS (CUSTOM PROCEDURE TRAY) ×2 IMPLANT
PACK UNIVERSAL I (CUSTOM PROCEDURE TRAY) ×2 IMPLANT
PENCIL BUTTON HOLSTER BLD 10FT (ELECTRODE) ×2 IMPLANT
PIN SAFETY STERILE (MISCELLANEOUS) IMPLANT
SHEET MEDIUM DRAPE 40X70 STRL (DRAPES) ×4 IMPLANT
SLEEVE SCD COMPRESS KNEE MED (MISCELLANEOUS) ×2 IMPLANT
SPONGE GAUZE 4X4 12PLY STER LF (GAUZE/BANDAGES/DRESSINGS) IMPLANT
SPONGE LAP 18X18 X RAY DECT (DISPOSABLE) ×2 IMPLANT
STRIP CLOSURE SKIN 1/2X4 (GAUZE/BANDAGES/DRESSINGS) IMPLANT
SUT MNCRL AB 4-0 PS2 18 (SUTURE) ×4 IMPLANT
SUT VIC AB 3-0 PS1 18 (SUTURE) ×2
SUT VIC AB 3-0 PS1 18XBRD (SUTURE) ×2 IMPLANT
SUT VIC AB 3-0 SH 27 (SUTURE)
SUT VIC AB 3-0 SH 27X BRD (SUTURE) IMPLANT
SUT VICRYL 4-0 PS2 18IN ABS (SUTURE) ×2 IMPLANT
SYR BULB IRRIGATION 50ML (SYRINGE) ×2 IMPLANT
SYR CONTROL 10ML LL (SYRINGE) ×2 IMPLANT
TAPE MEASURE VINYL STERILE (MISCELLANEOUS) ×2 IMPLANT
TOWEL OR 17X24 6PK STRL BLUE (TOWEL DISPOSABLE) ×4 IMPLANT
TUBE CONNECTING 20X1/4 (TUBING) ×2 IMPLANT
UNDERPAD 30X30 (UNDERPADS AND DIAPERS) ×4 IMPLANT
YANKAUER SUCT BULB TIP NO VENT (SUCTIONS) ×2 IMPLANT

## 2015-12-14 NOTE — Anesthesia Preprocedure Evaluation (Signed)
Anesthesia Evaluation  Patient identified by MRN, date of birth, ID band Patient awake    Reviewed: Allergy & Precautions, NPO status , Patient's Chart, lab work & pertinent test results  Airway Mallampati: II  TM Distance: >3 FB Neck ROM: Full    Dental no notable dental hx.    Pulmonary neg pulmonary ROS,    Pulmonary exam normal breath sounds clear to auscultation       Cardiovascular negative cardio ROS Normal cardiovascular exam Rhythm:Regular Rate:Normal     Neuro/Psych Anxiety negative neurological ROS     GI/Hepatic negative GI ROS, Neg liver ROS,   Endo/Other  negative endocrine ROS  Renal/GU negative Renal ROS  negative genitourinary   Musculoskeletal negative musculoskeletal ROS (+)   Abdominal   Peds negative pediatric ROS (+)  Hematology negative hematology ROS (+)   Anesthesia Other Findings   Reproductive/Obstetrics negative OB ROS                             Anesthesia Physical Anesthesia Plan  ASA: II  Anesthesia Plan: General   Post-op Pain Management:    Induction: Intravenous  Airway Management Planned: LMA  Additional Equipment:   Intra-op Plan:   Post-operative Plan: Extubation in OR  Informed Consent: I have reviewed the patients History and Physical, chart, labs and discussed the procedure including the risks, benefits and alternatives for the proposed anesthesia with the patient or authorized representative who has indicated his/her understanding and acceptance.   Dental advisory given  Plan Discussed with: CRNA and Surgeon  Anesthesia Plan Comments:         Anesthesia Quick Evaluation

## 2015-12-14 NOTE — Transfer of Care (Signed)
Immediate Anesthesia Transfer of Care Note  Patient: [name]  Procedure(s) Performed: Procedure(s): RIGHT BREAST MASTOPEXY (Right)  Patient Location: PACU  Anesthesia Type:General  Level of Consciousness: awake  Airway & Oxygen Therapy: Patient Spontanous Breathing and Patient connected to face mask oxygen  Post-op Assessment: Report given to RN and Post -op Vital signs reviewed and stable  Post vital signs: Reviewed and stable  Last Vitals:  Vitals:   12/14/15 0636  BP: 127/78  Pulse: 88  Resp: 16  Temp: 37 C    Last Pain:  Vitals:   12/14/15 0636  TempSrc: Oral         Complications: No apparent anesthesia complications

## 2015-12-14 NOTE — Anesthesia Procedure Notes (Signed)
Performed by: Lavayah Vita D       

## 2015-12-14 NOTE — Interval H&P Note (Signed)
History and Physical Interval Note:  12/14/2015 6:47 AM  [name]  has presented today for surgery, with the diagnosis of history of breast cancer  aqcuire asymtery breast  The various methods of treatment have been discussed with the patient and family. After consideration of risks, benefits and other options for treatment, the patient has consented to  Procedure(s): RIGHT BREAST MASTOPEXY (Right) as a surgical intervention .  The patient's history has been reviewed, patient examined, no change in status, stable for surgery.  I have reviewed the patient's chart and labs.  Questions were answered to the patient's satisfaction.     [name]

## 2015-12-14 NOTE — Anesthesia Postprocedure Evaluation (Signed)
Anesthesia Post Note  Patient: [name]  Procedure(s) Performed: Procedure(s) (LRB): RIGHT BREAST MASTOPEXY (Right)  Patient location during evaluation: PACU Anesthesia Type: General Level of consciousness: awake and alert Pain management: pain level controlled Vital Signs Assessment: post-procedure vital signs reviewed and stable Respiratory status: spontaneous breathing, nonlabored ventilation, respiratory function stable and patient connected to nasal cannula oxygen Cardiovascular status: blood pressure returned to baseline and stable Postop Assessment: no signs of nausea or vomiting Anesthetic complications: no    Last Vitals:  Vitals:   12/14/15 0930 12/14/15 0945  BP: (!) 142/79 139/73  Pulse: (!) 116 (!) 110  Resp: 13 13  Temp:      Last Pain:  Vitals:   12/14/15 0945  TempSrc:   PainSc: 1                  Jayleon Mcfarlane S

## 2015-12-14 NOTE — Discharge Instructions (Signed)

## 2015-12-14 NOTE — Op Note (Signed)
Operative Note   DATE OF OPERATION: 10.6.17  LOCATION: [facility]-outpatient  SURGICAL DIVISION: Plastic Surgery  PREOPERATIVE DIAGNOSES:  1. History left breast cancer 2. History therapeutic radiation 3. Asymmetry acquired breasts  POSTOPERATIVE DIAGNOSES:  same  PROCEDURE:  1. Right breast mastopexy  SURGEON: Irene Limbo MD MBA  ASSISTANT: none  ANESTHESIA:  General.   EBL: 10 ml  COMPLICATIONS: None immediate.   INDICATIONS FOR PROCEDURE:  The patient, [name], is a 55 y.o. female born on May 14, 1960, is here for right mastopexy to address asymmetry following left lumpectomy and radiation.   FINDINGS: Right mastopexy 73.6 g  DESCRIPTION OF PROCEDURE:  The patient was markedin the preoperative area including sternal notch, chest midline, breast meridians, and anterior axillary lines. The location of top areola and new nipple position marked on right symmetric with left. The patient was taken to the operating room. SCDs were placed and IV antibiotics were given. The patient's operative site was prepped and draped in a sterile fashion. A time out was performed and all information was confirmed to be correct. In supine position the inframammary fold was marked and 6 cm vertical limbs marked, symmetric with left. The transverse limbs then marked. Superior pedicle designed. The NAC was marked with 38 mm cookie cutter as this was closest in size to left NAC. The remainder of pedicle deepithelialized and superiorly based flap developed. Breast tissue excised over lower pole breast caudal to pedicle. The patient was tailor tacked and brought to upright sitting position and assessed for symmetry. Additional soft tissue marked by tailor tacking. The patient was returned to supine position and additional tissue marked by tailor tacking excised. Cavity irrigated and hemostasis obtained. Local anesthetic infiltrated. Closure completed with interrupted 3-0 vicryl in dermis for  closure of vertical and transverse limbs. The NAC was inset with 4-0 vicryl in dermis. Skin closure completed with 4-0 moncryl throughout. Tissue adhesive applied. Dry dressing and breast binder applied.  The patient was allowed to wake from anesthesia, extubated and taken to the recovery room in satisfactory condition.   SPECIMENS: right breast tissue  DRAINS: none  Irene Limbo, MD [facility] Plastic & Reconstructive Surgery [phone], pin [phone]

## 2015-12-14 NOTE — Anesthesia Procedure Notes (Signed)
Procedure Name: LMA Insertion Date/Time: 12/14/2015 7:34 AM Performed by: Melynda Ripple D Pre-anesthesia Checklist: Patient identified, Emergency Drugs available, Suction available and Patient being monitored Patient Re-evaluated:Patient Re-evaluated prior to inductionOxygen Delivery Method: Circle system utilized Preoxygenation: Pre-oxygenation with 100% oxygen Intubation Type: IV induction Ventilation: Mask ventilation without difficulty LMA: LMA inserted LMA Size: 4.0 Number of attempts: 1 Airway Equipment and Method: Bite block Placement Confirmation: positive ETCO2 Tube secured with: Tape Dental Injury: Teeth and Oropharynx as per pre-operative assessment

## 2015-12-17 ENCOUNTER — Encounter (HOSPITAL_BASED_OUTPATIENT_CLINIC_OR_DEPARTMENT_OTHER): Payer: Self-pay | Admitting: Plastic Surgery

## 2015-12-29 ENCOUNTER — Other Ambulatory Visit: Payer: Self-pay | Admitting: Family Medicine

## 2016-01-08 ENCOUNTER — Telehealth: Payer: Self-pay

## 2016-01-08 NOTE — Assessment & Plan Note (Signed)
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 Adjuvant chemotherapy with dose dense Adriamycin and Cytoxan x 4 followed by Abraxane weekly 12 because of lymph node positive disease. Started 09/04/14 to 01/15/15 2. adjuvant radiation completed 03/20/15 3. Followed by antiestrogen therapy started 04/03/15 -------------------------------------------------------------------------------------------------------------------------- Anastrozole toxicities: Patient denies any hot flashes. 1. Complains of occasional myalgias 2. Weight gain: I believe the cause the weight gain as her dietary habits and she will work very hard to lose the pounds. I discussed with her that based on the BMI she is overweight.  Bone density 07/26/2015: T score -1.9, osteopenia  PALLAS Trial : Patient has been randomized to anastrozole alone. ABC clinical trial: The trial randomizes between aspirin versus placebo. She went on trial 07/26/2015  Adverse effects: 1. Occasional bruising  Cognitive dysfunction: Patient is interested in the clinical trial that we plan to open her cognitive dysfunction.   Parotid nodules: Biopsy-proven to be lymphoid infiltrates. No malignant cells seen  Her mammogram done 07/26/2015 is normal.  Patient plans to go to [place] for a vacation. Return to clinic in 3 months for follow-up  

## 2016-01-08 NOTE — Telephone Encounter (Signed)
Called patient to remind her of her appointments on 01/09/16 and to bring her study meds and diaries and her voice mail is full and cannot accept any new messages.

## 2016-01-09 ENCOUNTER — Encounter: Payer: PRIVATE HEALTH INSURANCE | Admitting: *Deleted

## 2016-01-09 ENCOUNTER — Encounter: Payer: Self-pay | Admitting: *Deleted

## 2016-01-09 ENCOUNTER — Other Ambulatory Visit (HOSPITAL_BASED_OUTPATIENT_CLINIC_OR_DEPARTMENT_OTHER): Payer: PRIVATE HEALTH INSURANCE

## 2016-01-09 ENCOUNTER — Encounter: Payer: Self-pay | Admitting: Hematology and Oncology

## 2016-01-09 ENCOUNTER — Ambulatory Visit (HOSPITAL_BASED_OUTPATIENT_CLINIC_OR_DEPARTMENT_OTHER): Payer: PRIVATE HEALTH INSURANCE | Admitting: Hematology and Oncology

## 2016-01-09 VITALS — BP 139/78 | HR 83 | Temp 98.3°F | Resp 18 | Wt 164.4 lb

## 2016-01-09 DIAGNOSIS — Z170 Estrogen receptor positive status [ER+]: Secondary | ICD-10-CM | POA: Diagnosis not present

## 2016-01-09 DIAGNOSIS — C50412 Malignant neoplasm of upper-outer quadrant of left female breast: Secondary | ICD-10-CM

## 2016-01-09 DIAGNOSIS — Z006 Encounter for examination for normal comparison and control in clinical research program: Secondary | ICD-10-CM | POA: Diagnosis not present

## 2016-01-09 DIAGNOSIS — Z23 Encounter for immunization: Secondary | ICD-10-CM

## 2016-01-09 LAB — CBC WITH DIFFERENTIAL/PLATELET
BASO%: 0.4 % (ref 0.0–2.0)
BASOS ABS: 0 10*3/uL (ref 0.0–0.1)
EOS%: 1.1 % (ref 0.0–7.0)
Eosinophils Absolute: 0.1 10*3/uL (ref 0.0–0.5)
HCT: 38.6 % (ref 34.8–46.6)
HGB: 12.9 g/dL (ref 11.6–15.9)
LYMPH%: 50.6 % — ABNORMAL HIGH (ref 14.0–49.7)
MCH: 32.4 pg (ref 25.1–34.0)
MCHC: 33.5 g/dL (ref 31.5–36.0)
MCV: 96.7 fL (ref 79.5–101.0)
MONO#: 0.5 10*3/uL (ref 0.1–0.9)
MONO%: 9.9 % (ref 0.0–14.0)
NEUT#: 1.9 10*3/uL (ref 1.5–6.5)
NEUT%: 38 % — AB (ref 38.4–76.8)
Platelets: 164 10*3/uL (ref 145–400)
RBC: 3.99 10*6/uL (ref 3.70–5.45)
RDW: 12.7 % (ref 11.2–14.5)
WBC: 5.1 10*3/uL (ref 3.9–10.3)
lymph#: 2.6 10*3/uL (ref 0.9–3.3)

## 2016-01-09 LAB — COMPREHENSIVE METABOLIC PANEL
ALT: 26 U/L (ref 0–55)
AST: 22 U/L (ref 5–34)
Albumin: 3.8 g/dL (ref 3.5–5.0)
Alkaline Phosphatase: 89 U/L (ref 40–150)
Anion Gap: 8 mEq/L (ref 3–11)
BUN: 14.1 mg/dL (ref 7.0–26.0)
CHLORIDE: 108 meq/L (ref 98–109)
CO2: 25 mEq/L (ref 22–29)
Calcium: 8.9 mg/dL (ref 8.4–10.4)
Creatinine: 0.6 mg/dL (ref 0.6–1.1)
EGFR: >90 mL/min/{1.73_m2} (ref >90)
GLUCOSE: 105 mg/dL (ref 70–140)
POTASSIUM: 4.1 meq/L (ref 3.5–5.1)
SODIUM: 141 meq/L (ref 136–145)
Total Bilirubin: 0.41 mg/dL (ref 0.20–1.20)
Total Protein: 7.1 g/dL (ref 6.4–8.3)

## 2016-01-09 MED ORDER — INFLUENZA VAC SPLIT QUAD 0.5 ML IM SUSY
0.5000 mL | PREFILLED_SYRINGE | Freq: Once | INTRAMUSCULAR | Status: AC
  Administered 2016-01-09: 0.5 mL via INTRAMUSCULAR
  Filled 2016-01-09: qty 0.5

## 2016-01-09 MED ORDER — DONEPEZIL HCL 5 MG PO TABS: 5.0000 mg | ORAL_TABLET | Freq: Every day | ORAL | 0 refills | Status: DC

## 2016-01-09 MED ORDER — INV-ASPIRIN/PLACEBO 300 MG TABS ALLIANCE A011502: 1.0000 | ORAL_TABLET | Freq: Every day | ORAL | 0 refills | Status: DC

## 2016-01-09 MED ORDER — DONEPEZIL HCL 10 MG PO TABS: 10.0000 mg | ORAL_TABLET | Freq: Every day | ORAL | 3 refills | Status: DC

## 2016-01-09 NOTE — Progress Notes (Signed)
01/09/2016 Patient in to clinic unaccompanied today for dual enrollment studies routine visits, including AFT-05 PALLAS (Cycle 9, Day 1) and Alliance [id number] ABC (Month 6).  AFT-05 PALLAS - Cycle 9, Day 1 See separate Consent Form encounter regarding reconsent at today's visit. Patient returned her completed anti-hormone therapy drug diaries for Cycles 5 through 8. Due to one remaining dose to be recorded on Cycle 8 diary, a copy of the diary was retained at the site, and the original was returned to the patient for completion, along with a postage-paid return envelope for the clinical research department. Patient to record this evening's dose, then mail completed diary to the site. Patient was given drug diaries for Cycles 9, 10 and 11 of anti-hormone therapy, beginning with tomorrow's date. Instructed patient to return the completed diaries at the next study visit, to be scheduled in January 2018. See Adverse Event Log table below regarding AEs. Patient also reports significant weight gain since stopping treatment.  [name] BSN, RN, CCRP 01/09/2016 11:35 AM  Adverse Event Log - AFT-05 PALLAS Cycles 6-8 Baseline AEs (grade): dizziness, int. (1); insomnia (1); anxiety (1); hiccups, int. (1); lower extremity pain, int. (2) Event Grade Onset Date Resolved Date Attribution to anastrozole Treatment Comments  Memory impairment Grade 1 07/2015 01/09/2016 No None Worsened in grade  Memory impairment Grade 2 01/09/2016 ongoing No Aricept   Musculoskeletal aches Grade 2 07/2015 ongoing Yes None Reports improved to grade 1  Bruising Grade 1 08/25/2015 ongoing No None   Muscle stiffness Grade 2 08/23/2015 ongoing Yes None Reports improved to grade 1  Hypertension Grade 2 10/17/2015 ongoing No None Improved to grade 1 on 11/21/15  Surgical site pain Grade 2 12/14/2015 01/09/2016 No Pain med   Breast edema Grade 1 12/20/2015 01/09/2016 No None   Weight gain Grade 1 12/20/2015 ongoing Yes None   Osteopenia Grade 1  07/26/2015 Ongoing Yes None   [name] BSN, RN, [place] 02/24/2016 9:52 AM  Alliance [id number] ABC - Month 6 visit Patient returned completed medication logs for July through October. Patient was given blank copies of medication logs for November through May, and will continue daily dosing of study medication. Patient did not return the first two bottles of study medication, dispensed in May, however she states that she has one empty bottle and one partial bottle remaining. Instructed patient to continue dosing from partial bottle, and to return both bottles at the next PALLAS study visit in January.  Patient reports continued mild bruising as reported in August, with mild bruising following her breast surgery and associated with working in the yard. She has had occasional episodes of heartburn. She reports one episode of mild hemorrhoidal bleeding, which she related to an episode of mild constipation. She denies any symptoms of nosebleeds, blood in her urine or gastritis, and there has been no documentation of intracranial hemorrhage.  She denies taking any aspirin or medications containing NSAIDs, and states that she knows to take only Tylenol for mild pain. Two new bottles of study medication (aspirin/placebo x 90 tablets per bottle) were dispensed by pharmacist, Kennith Center and were given to the patient for continued dosing through next study visit in May.  Patient is aware that no contacts are planned between now and the next clinic visit in January 2018, and that she should contact the office if there are any questions or concerns in the meantime. Based on lab results review and physical exam, Dr. Lindi Adie cleared the patient to continue  with the PALLAS and ABC trials.  [name] BSN, RN, [place] 01/09/2016 1:26 PM  Adverse Event Log - Alliance [id number] ABC Month 6 visit Event Grade Attribution to aspirin/placebo Comments  Hypertension Grade 2 Unrelated   Muscle stiffness Grade 2 Unrelated    Bruising Grade 1 Probable   Surgical site pain Grade 2 Unrelated   Breast edema Grade 1 Unrelated   Weight gain Grade 1 Unrelated   Memory impairment Grade 2 Unrelated   [name] BSN, RN, [place] 02/24/2016 9:56 AM

## 2016-01-09 NOTE — Progress Notes (Signed)
Patient Care Team: Chipper Herb, MD as PCP - General (Family Medicine) Nicholas Lose, MD as Consulting Physician (Hematology and Oncology) Coralie Keens, MD as Consulting Physician (General Surgery) Kyung Rudd, MD as Consulting Physician (Radiation Oncology) Sylvan Cheese, NP as Nurse Practitioner (Hematology and Oncology)  DIAGNOSIS:  Encounter Diagnosis  Name Primary?  . Malignant neoplasm of upper-outer quadrant of left breast in female, estrogen receptor positive ([place])     SUMMARY OF ONCOLOGIC HISTORY:   Breast cancer of upper-outer quadrant of left female breast ([place])   07/18/2014 Mammogram    Distortion left breast, breast density category C; U/S 1.3 x 0.8 x 0.8 cm left breast mass at 2:00 position 4 cm from nipple, no lymph nodes      07/20/2014 Initial Diagnosis    Left breast Biopsy: Invasive lobular cancer with LCIS, Grade 1, ER 86%, PR 78%, Her 2 Neg Ratio 1.77, Ki 67: 14%      08/01/2014 Breast MRI    Breast MRI showed non-mass enhancement 3.9 cm, no lymph nodes      08/01/2014 Clinical Stage    Stage IIA: T2 N0      08/08/2014 Surgery    Left breast lumpectomy: 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, with LCIS, medial and inferior margins positive, 3/4 lymph nodes positive      08/08/2014 Pathologic Stage    Stage IIB: T2 N1c M0      08/16/2014 Surgery    Left breast medial margin reexcision residual ILC 0.2 cm; inferior margin residual foci less than 0.2 cm, 1/5 lymph nodes positive, 1 lymph node with isolated tumor cells (Overall 4/10)      09/04/2014 - 01/15/2015 Chemotherapy    Adjuvant chemotherapy with dose dense Adriamycin and Cytoxan 4 followed by Abraxane weekly 8 ( stopped early for profound neutropenia and thrombocytopenia)      01/29/2015 - 03/20/2015 Radiation Therapy    Adjuvant XRT [facility]): 50.4 Gy over 28 fractions; seroma boost: 10 Gy over 5 fractions. Total dose: 60.4 Gy      04/11/2015 -  Anti-estrogen oral therapy   Anastrozole 1 mg daily (PALLAS clinical trial 05/24/2015 patient was randomized to hormone therapy alone)      05/24/2015 Survivorship    Survivorship visit completed and copy of care plan given to patient       CHIEF COMPLIANT: Follow-up on anastrozole therapy  INTERVAL HISTORY: [name] is a 55 year old with above-mentioned history of left breast cancer who underwent lumpectomy followed by adjuvant chemotherapy and radiation and is currently on antiestrogen therapy with anastrozole. She is currently enrolled in clinical trials PALLAS, ABC (aspirin vs placebo). She is tolerating anastrozole fairly well. Her major concerns were related to her level of cognitive dysfunction. Previously she was an extremely high functioning executive. She has noticed marked decrease in her memory retention which was affecting her confidence levels and emotional state. Previously we have discussed the role of exercise as well as online brain games with services like [employer].  Patient had breast reduction. She is extremely concerned about weight gain as well as memory problems today.  REVIEW OF SYSTEMS:   Constitutional: Denies fevers, chills or abnormal weight loss Eyes: Denies blurriness of vision Ears, nose, mouth, throat, and face: Denies mucositis or sore throat Respiratory: Denies cough, dyspnea or wheezes Cardiovascular: Denies palpitation, chest discomfort Gastrointestinal:  Denies nausea, heartburn or change in bowel habits Skin: Denies abnormal skin rashes Lymphatics: Denies new lymphadenopathy or easy bruising Neurological:Denies numbness, tingling  or new weaknesses Behavioral/Psych: Cognitive dysfunction related to prior chemotherapy  Extremities: No lower extremity edema Breast:  Recent breast reduction surgery All other systems were reviewed with the patient and are negative.  I have reviewed the past medical history, past surgical history, social history and family history with the  patient and they are unchanged from previous note.  ALLERGIES:  is allergic to crestor [rosuvastatin]; lipitor [atorvastatin]; and temazepam.  MEDICATIONS:  Current Outpatient Prescriptions  Medication Sig Dispense Refill  . acetaminophen (TYLENOL) 325 MG tablet Take 975 mg by mouth daily as needed for moderate pain. Usually takes twice daily in morning and bedtime and additionally during the day if needed for pain    . anastrozole (ARIMIDEX) 1 MG tablet Take 1 tablet (1 mg total) by mouth daily. 90 tablet 3  . Biotin 1000 MCG tablet Take 1,000 mcg by mouth daily.    . clonazePAM (KLONOPIN) 0.5 MG tablet TAKE 1 TABLET BY MOUTH 3 TIMES A DAY AS NEEDED 90 tablet 1  . donepezil (ARICEPT) 10 MG tablet Take 1 tablet (10 mg total) by mouth at bedtime. 90 tablet 3  . donepezil (ARICEPT) 5 MG tablet Take 1 tablet (5 mg total) by mouth at bedtime. 30 tablet 0  . HYDROcodone-acetaminophen (NORCO) 5-325 MG tablet Take 1 tablet by mouth every 6 (six) hours as needed for moderate pain. 10 tablet 0  . Investigational aspirin/placebo 300 MG tablet Alliance [id number] Take 1 tablet by mouth daily. Take with food or a full glass of water.  Do not crush enteric coated tablets. 180 tablet 0  . OVER THE COUNTER MEDICATION Take 1 capsule by mouth daily. Omega-3 SLIM supplement    . OVER THE COUNTER MEDICATION Take 1 capsule by mouth 2 (two) times daily. Innate Response Formulas Inflama-Complete    . PARoxetine (PAXIL-CR) 25 MG 24 hr tablet Take 1 tablet (25 mg total) by mouth daily. 30 tablet 11  . Turmeric 500 MG TABS Take 1 tablet by mouth daily.    . Vitamin D, Ergocalciferol, (DRISDOL) 50000 units CAPS capsule TAKE 1 CAPSULE (50,000 UNITS TOTAL) BY MOUTH EVERY 7 (SEVEN) DAYS. 12 capsule 0   No current facility-administered medications for this visit.     PHYSICAL EXAMINATION: ECOG PERFORMANCE STATUS: 1 - Symptomatic but completely ambulatory  Vitals:   01/09/16 0903  BP: 139/78  Pulse: 83  Resp: 18    Temp: 98.3 F (36.8 C)   Filed Weights   01/09/16 0903  Weight: 164 lb 6.4 oz (74.6 kg)    GENERAL:alert, no distress and comfortable SKIN: skin color, texture, turgor are normal, no rashes or significant lesions EYES: normal, Conjunctiva are pink and non-injected, sclera clear OROPHARYNX:no exudate, no erythema and lips, buccal mucosa, and tongue normal  NECK: supple, thyroid normal size, non-tender, without nodularity LYMPH:  no palpable lymphadenopathy in the cervical, axillary or inguinal LUNGS: clear to auscultation and percussion with normal breathing effort HEART: regular rate & rhythm and no murmurs and no lower extremity edema ABDOMEN:abdomen soft, non-tender and normal bowel sounds MUSCULOSKELETAL:no cyanosis of digits and no clubbing  NEURO: alert & oriented x 3 with fluent speech, no focal motor/sensory deficits EXTREMITIES: No lower extremity edema BREAST: No palpable masses or nodules in either right or left breasts. No palpable axillary supraclavicular or infraclavicular adenopathy no breast tenderness or nipple discharge. (exam performed in the presence of a chaperone)  LABORATORY DATA:  I have reviewed the data as listed   Chemistry  Component Value Date/Time   NA 141 01/09/2016 0843   K 4.1 01/09/2016 0843   CL 99 (L) 09/09/2014 2336   CO2 25 01/09/2016 0843   BUN 14.1 01/09/2016 0843   CREATININE 0.6 01/09/2016 0843      Component Value Date/Time   CALCIUM 8.9 01/09/2016 0843   ALKPHOS 89 01/09/2016 0843   AST 22 01/09/2016 0843   ALT 26 01/09/2016 0843   BILITOT 0.41 01/09/2016 0843       Lab Results  Component Value Date   WBC 5.1 01/09/2016   HGB 12.9 01/09/2016   HCT 38.6 01/09/2016   MCV 96.7 01/09/2016   PLT 164 01/09/2016   NEUTROABS 1.9 01/09/2016   ASSESSMENT & PLAN:  Breast cancer of upper-outer quadrant of left female breast Left breast lumpectomy 08/08/2014: 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, with LCIS, medial and  inferior margins positive, 3/4 lymph nodes positive T2 N1 cM0 stage IIB, ER 86%, PR 78%, HER-2/neu negative ratio 1.77, Ki-67 14% Left breast medial margin reexcision 08/18/14: residual ILC 0.2 cm; inferior margin residual foci less than 0.2 cm, 1/5 lymph nodes positive, 1 lymph node with isolated tumor cells (Overall 4/10)  Treatment Summary: 1. Adjuvant chemotherapy with dose dense Adriamycin and Cytoxan x 4 followed by Abraxane weekly 12 because of lymph node positive disease. Started 09/04/14 to 01/15/15 2. adjuvant radiation completed 03/20/15 3. Followed by antiestrogen therapy started 04/03/15 -------------------------------------------------------------------------------------------------------------------------- Anastrozole toxicities: Patient denies any hot flashes. 1. Complains of occasional myalgias 2. Weight gain: I believe the cause the weight gain as her dietary habits and she will work very hard to lose the pounds. I discussed with her that based on the BMI she is overweight.  Bone density 07/26/2015: T score -1.9, osteopenia  PALLAS Trial : Patient has been randomized to anastrozole alone. ABC clinical trial: The trial randomizes between aspirin versus placebo. She went on trial 07/26/2015  Adverse effects: 1. Occasional bruising  Cognitive dysfunction: I prescribed her Aricept 5 mg daily for one month. Increase to 10 mg daily from second month. I also encouraged her to a take part in [email or web address] brain games. Weight gain: I instructed her to join [employer]. Parotid nodules: Biopsy-proven to be lymphoid infiltrates. No malignant cells seen  Breast cancer surveillance: 1. Mammogram done 07/26/2015 is normal. 2. Breast exam 01/09/2016: No palpable lumps or nodules  Return to clinic in 3 months for follow-up.  No orders of the defined types were placed in this encounter.  The patient has a good understanding of the overall plan. she agrees with it. she will call  with any problems that may develop before the next visit here.   Rulon Eisenmenger, MD 01/09/16

## 2016-01-24 ENCOUNTER — Telehealth: Payer: Self-pay

## 2016-01-24 ENCOUNTER — Encounter: Payer: Self-pay | Admitting: Family Medicine

## 2016-01-24 NOTE — Telephone Encounter (Signed)
Received phone call from [facility] regarding faxing over progress notes from last visit with MD for pt. Left a fax # but not a call back #. Unable to track this call at this time.

## 2016-02-05 ENCOUNTER — Other Ambulatory Visit: Payer: Self-pay | Admitting: Family Medicine

## 2016-02-05 NOTE — Telephone Encounter (Signed)
Last seen 04/2015 Refill or not?

## 2016-02-06 NOTE — Telephone Encounter (Signed)
Phoned in / NTBS  - 0 refills - approved by [facility]

## 2016-03-19 ENCOUNTER — Ambulatory Visit (INDEPENDENT_AMBULATORY_CARE_PROVIDER_SITE_OTHER): Payer: PRIVATE HEALTH INSURANCE | Admitting: Family Medicine

## 2016-03-19 ENCOUNTER — Telehealth: Payer: Self-pay | Admitting: Emergency Medicine

## 2016-03-19 ENCOUNTER — Encounter: Payer: Self-pay | Admitting: Family Medicine

## 2016-03-19 VITALS — BP 135/87 | HR 89 | Temp 98.2°F | Ht 65.0 in | Wt 161.0 lb

## 2016-03-19 DIAGNOSIS — R58 Hemorrhage, not elsewhere classified: Secondary | ICD-10-CM | POA: Diagnosis not present

## 2016-03-19 NOTE — Assessment & Plan Note (Addendum)
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 Adjuvant chemotherapy with dose dense Adriamycin and Cytoxan x 4 followed by Abraxane weekly 12 because of lymph node positive disease. Started 09/04/14 to 01/15/15 2. adjuvant radiation completed 03/20/15 3. Followed by antiestrogen therapy started 04/03/15 -------------------------------------------------------------------------------------------------------------------------- Anastrozole toxicities: Patient denies any hot flashes. 1. Complains of occasional myalgias 2. Weight gain: I believe the cause the weight gain as her dietary habits and she will work very hard to lose the pounds. I discussed with her that based on the BMI she is overweight.  Bone density 07/26/2015: T score -1.9, osteopenia  PALLAS Trial : Patient has been randomized to anastrozole alone. ABC clinical trial: The trial randomizes between aspirin versus placebo. She went on trial 07/26/2015  Adverse effects: 1. Bruising/ Hematoma  Cognitive dysfunction: On Aricept 5 mg daily for one month. Increase to 10 mg daily from second month. I also encouraged her to a take part in [email or web address] brain games.  Weight gain: I instructed her to join [employer]. Parotid nodules: Biopsy-proven to be lymphoid infiltrates. No malignant cells seen  Breast cancer surveillance: 1. Mammogram done 07/26/2015 is normal. 2. Breast exam 01/09/2016: No palpable lumps or nodules  Return to clinic in 3 months for follow-up.

## 2016-03-19 NOTE — Telephone Encounter (Signed)
Patient called with concerns that she has noticed a bruise on her right thigh today approximately 30-45 minutes ago.  Patient states she can palpate a "pea sized" knot at the site of the bruise. States the area is hard and sore on palpation. Denies any pain when not palpating the area.  Patient requesting to see Dr Lindi Adie today or tomorrow. Patient is currently on a study. Dr Lindi Adie aware; appointment scheduled for tomorrow at 0800; patient and research department aware. Patient currently at PCP for eval of area.

## 2016-03-19 NOTE — Progress Notes (Signed)
Subjective:  Patient ID: [name], female    DOB: 03/08/1961  Age: 56 y.o. MRN: [id number]  CC: Skin Discoloration (pt here today c/o bruise with a knot in it)   HPI KARY SUGRUE presents for Onset today of discoloration noted at the right flank. There is no known injury. There is no previous existing lesion. There is a small nodule within that. She is a breast cancer survivor. She is currently in a clinical trial regarding aspirin. She is not entirely sure but thinks she is actually getting the active ingredient since her bruising has been frequent and widespread. This one is similar to previous with the exception of small nodule located centrally within it. She has had a cough recently and has potentially coughed so hard she pulled a muscle in that area.  History Saylee has a past medical history of Abnormal Pap smear of cervix (2002); Allergy (2012); Anxiety; Benign hemangioma; Breast cancer ([place]) (07/20/14); Cervical stenosis (uterine cervix) (2002); Depression; Dizziness (1991); Hyperlipidemia (2007); Insomnia (1997); Meniere's disease (1991); and Nervousness(799.21) (1987).   She has a past surgical history that includes uterine ablation (02/2006); bilateral  tubal ligation (05/08/2004); Tubal ligation; wisdom teeth extractions (yrs ago); Shoulder open rotator cuff repair (Right, 07/28/2012); Breast lumpectomy with radioactive seed and sentinel lymph node biopsy (Left, 08/08/2014); Re-excision of breast lumpectomy (Left, 08/16/2014); Portacath placement (Right, 08/16/2014); Axillary lymph node dissection (Left, 08/16/2014); Port-a-cath removal (N/A, 03/08/2015); and Mastopexy (Right, 12/14/2015).   Her family history includes Cirrhosis in her father; Colon polyps in her mother.She reports that she has never smoked. She has never used smokeless tobacco. She reports that she drinks alcohol. She reports that she does not use drugs.  Current Outpatient Prescriptions on File Prior to Visit    Medication Sig Dispense Refill  . acetaminophen (TYLENOL) 325 MG tablet Take 975 mg by mouth daily as needed for moderate pain. Usually takes twice daily in morning and bedtime and additionally during the day if needed for pain    . anastrozole (ARIMIDEX) 1 MG tablet Take 1 tablet (1 mg total) by mouth daily. 90 tablet 3  . Biotin 1000 MCG tablet Take 1,000 mcg by mouth daily.    . clonazePAM (KLONOPIN) 0.5 MG tablet TAKE 1 TABLET BY MOUTH 3 TIMES A DAY AS NEEDED 90 tablet 0  . donepezil (ARICEPT) 10 MG tablet Take 1 tablet (10 mg total) by mouth at bedtime. 90 tablet 3  . Investigational aspirin/placebo 300 MG tablet Alliance [id number] Take 1 tablet by mouth daily. Take with food or a full glass of water.  Do not crush enteric coated tablets. 180 tablet 0  . OVER THE COUNTER MEDICATION Take 1 capsule by mouth daily. Omega-3 SLIM supplement    . OVER THE COUNTER MEDICATION Take 1 capsule by mouth 2 (two) times daily. Innate Response Formulas Inflama-Complete    . PARoxetine (PAXIL-CR) 25 MG 24 hr tablet Take 1 tablet (25 mg total) by mouth daily. 30 tablet 11  . Turmeric 500 MG TABS Take 1 tablet by mouth daily.    . Vitamin D, Ergocalciferol, (DRISDOL) 50000 units CAPS capsule TAKE 1 CAPSULE (50,000 UNITS TOTAL) BY MOUTH EVERY 7 (SEVEN) DAYS. 12 capsule 0   No current facility-administered medications on file prior to visit.     ROS Review of Systems  Constitutional: Negative for activity change, appetite change and fever.  HENT: Negative for congestion, rhinorrhea and sore throat.   Eyes: Negative for visual disturbance.  Respiratory: Negative for  cough and shortness of breath.   Cardiovascular: Negative for chest pain and palpitations.  Gastrointestinal: Negative for abdominal pain, diarrhea and nausea.  Genitourinary: Negative for dysuria.  Musculoskeletal: Negative for arthralgias and myalgias.    Objective:  BP 135/87   Pulse 89   Temp 98.2 F (36.8 C) (Oral)   Ht '5\' 5"'$  (1.651  m)   Wt 161 lb (73 kg)   BMI 26.79 kg/m   Physical Exam  Constitutional: She is oriented to person, place, and time. She appears well-developed and well-nourished. No distress.  HENT:  Head: Normocephalic and atraumatic.  Eyes: Conjunctivae are normal. Pupils are equal, round, and reactive to light.  Neck: Normal range of motion. Neck supple. No thyromegaly present.  Cardiovascular: Normal rate, regular rhythm and normal heart sounds.   No murmur heard. Pulmonary/Chest: Effort normal and breath sounds normal. No respiratory distress. She has no wheezes. She has no rales.  Abdominal: Soft. Bowel sounds are normal. She exhibits no distension. There is no tenderness.  Musculoskeletal: Normal range of motion.  Lymphadenopathy:    She has no cervical adenopathy.  Neurological: She is alert and oriented to person, place, and time.  Skin: Skin is warm and dry.  There is an irregular hematoma formation measuring 2 x 3 cm at the anterior axillary line at the right 11th rib. There is a 6 mm freely movable nodule centrally within that. It is normally tender. There is no fluctuance it is rather rubbery.  Psychiatric: She has a normal mood and affect. Her behavior is normal. Judgment and thought content normal.    Assessment & Plan:   [place] was seen today for skin discoloration.  Diagnoses and all orders for this visit:  Ecchymosis   I have discontinued Ms. Luiz's HYDROcodone-acetaminophen. I am also having her maintain her acetaminophen, PARoxetine, anastrozole, Biotin, Turmeric, OVER THE COUNTER MEDICATION, OVER THE COUNTER MEDICATION, Vitamin D (Ergocalciferol), donepezil, Investigational aspirin/placebo, and clonazePAM.  No orders of the defined types were placed in this encounter.    Follow-up: Return in about 1 week (around 03/26/2016), or if symptoms worsen or fail to improve.  Claretta Fraise, M.D.

## 2016-03-20 ENCOUNTER — Ambulatory Visit (HOSPITAL_BASED_OUTPATIENT_CLINIC_OR_DEPARTMENT_OTHER): Payer: PRIVATE HEALTH INSURANCE | Admitting: Hematology and Oncology

## 2016-03-20 ENCOUNTER — Encounter: Payer: Self-pay | Admitting: *Deleted

## 2016-03-20 ENCOUNTER — Encounter: Payer: Self-pay | Admitting: Hematology and Oncology

## 2016-03-20 DIAGNOSIS — C50412 Malignant neoplasm of upper-outer quadrant of left female breast: Secondary | ICD-10-CM

## 2016-03-20 DIAGNOSIS — R238 Other skin changes: Secondary | ICD-10-CM | POA: Insufficient documentation

## 2016-03-20 DIAGNOSIS — Z170 Estrogen receptor positive status [ER+]: Secondary | ICD-10-CM

## 2016-03-20 DIAGNOSIS — Z79811 Long term (current) use of aromatase inhibitors: Secondary | ICD-10-CM

## 2016-03-20 DIAGNOSIS — Z006 Encounter for examination for normal comparison and control in clinical research program: Secondary | ICD-10-CM

## 2016-03-20 DIAGNOSIS — R233 Spontaneous ecchymoses: Secondary | ICD-10-CM | POA: Diagnosis not present

## 2016-03-20 NOTE — Progress Notes (Signed)
Patient Care Team: Chipper Herb, MD as PCP - General (Family Medicine) Benson Norway, RN as Registered Nurse (Oncology) Nicholas Lose, MD as Consulting Physician (Hematology and Oncology) Coralie Keens, MD as Consulting Physician (General Surgery) Kyung Rudd, MD as Consulting Physician (Radiation Oncology) Sylvan Cheese, NP as Nurse Practitioner (Hematology and Oncology)  DIAGNOSIS:  Encounter Diagnosis  Name Primary?  . Malignant neoplasm of upper-outer quadrant of left breast in female, estrogen receptor positive ([place])     SUMMARY OF ONCOLOGIC HISTORY:   Breast cancer of upper-outer quadrant of left female breast ([place])   07/18/2014 Mammogram    Distortion left breast, breast density category C; U/S 1.3 x 0.8 x 0.8 cm left breast mass at 2:00 position 4 cm from nipple, no lymph nodes      07/20/2014 Initial Diagnosis    Left breast Biopsy: Invasive lobular cancer with LCIS, Grade 1, ER 86%, PR 78%, Her 2 Neg Ratio 1.77, Ki 67: 14%      08/01/2014 Breast MRI    Breast MRI showed non-mass enhancement 3.9 cm, no lymph nodes      08/01/2014 Clinical Stage    Stage IIA: T2 N0      08/08/2014 Surgery    Left breast lumpectomy: 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, with LCIS, medial and inferior margins positive, 3/4 lymph nodes positive      08/08/2014 Pathologic Stage    Stage IIB: T2 N1c M0      08/16/2014 Surgery    Left breast medial margin reexcision residual ILC 0.2 cm; inferior margin residual foci less than 0.2 cm, 1/5 lymph nodes positive, 1 lymph node with isolated tumor cells (Overall 4/10)      09/04/2014 - 01/15/2015 Chemotherapy    Adjuvant chemotherapy with dose dense Adriamycin and Cytoxan 4 followed by Abraxane weekly 8 ( stopped early for profound neutropenia and thrombocytopenia)      01/29/2015 - 03/20/2015 Radiation Therapy    Adjuvant XRT [facility]): 50.4 Gy over 28 fractions; seroma boost: 10 Gy over 5 fractions. Total dose: 60.4 Gy      04/11/2015 -  Anti-estrogen oral therapy    Anastrozole 1 mg daily (PALLAS clinical trial 05/24/2015 patient was randomized to hormone therapy alone)      05/24/2015 Survivorship    Survivorship visit completed and copy of care plan given to patient       CHIEF COMPLIANT: Bruising due to aspirin study  INTERVAL HISTORY: [name] is a 56 year old with above-mentioned history of left breast cancer who is currently on hormonal therapy with anastrozole. She is currently on ABC study and has noticed mild bruising in the past. She is coming in urgently today for evaluation off it appears to be a small hematoma On the right lateral side of the abdomen. She has noted a bruise and when she ran her fingers on it she felt a tiny nodule. This got her worried and she is here today for evaluation. She reports to me that she has noticed blood in the stool when she strains as well as blood after sexual intercourse. She also had bruises on arms on both sides on the back and legs as well.   REVIEW OF SYSTEMS:   Constitutional: Denies fevers, chills or abnormal weight loss Eyes: Denies blurriness of vision Ears, nose, mouth, throat, and face: Denies mucositis or sore throat Respiratory: Denies cough, dyspnea or wheezes Cardiovascular: Denies palpitation, chest discomfort Gastrointestinal: Constipation causing occasional bloody stool  Skin:Several bruises  throughout her body  Lymphatics: Denies new lymphadenopathy or easy bruising Neurological:Denies numbness, tingling or new weaknesses Behavioral/Psych: Mood is stable, no new changes  Extremities: No lower extremity edema  All other systems were reviewed with the patient and are negative.  I have reviewed the past medical history, past surgical history, social history and family history with the patient and they are unchanged from previous note.  ALLERGIES:  is allergic to statins; crestor [rosuvastatin]; lipitor [atorvastatin]; and  temazepam.  MEDICATIONS:  Current Outpatient Prescriptions  Medication Sig Dispense Refill  . acetaminophen (TYLENOL) 325 MG tablet Take 975 mg by mouth daily as needed for moderate pain. Usually takes twice daily in morning and bedtime and additionally during the day if needed for pain    . anastrozole (ARIMIDEX) 1 MG tablet Take 1 tablet (1 mg total) by mouth daily. 90 tablet 3  . Biotin 1000 MCG tablet Take 1,000 mcg by mouth daily.    . clonazePAM (KLONOPIN) 0.5 MG tablet TAKE 1 TABLET BY MOUTH 3 TIMES A DAY AS NEEDED 90 tablet 0  . donepezil (ARICEPT) 10 MG tablet Take 1 tablet (10 mg total) by mouth at bedtime. 90 tablet 3  . Investigational aspirin/placebo 300 MG tablet Alliance [id number] Take 1 tablet by mouth daily. Take with food or a full glass of water.  Do not crush enteric coated tablets. 180 tablet 0  . OVER THE COUNTER MEDICATION Take 1 capsule by mouth daily. Omega-3 SLIM supplement    . OVER THE COUNTER MEDICATION Take 1 capsule by mouth 2 (two) times daily. Innate Response Formulas Inflama-Complete    . PARoxetine (PAXIL-CR) 25 MG 24 hr tablet Take 1 tablet (25 mg total) by mouth daily. 30 tablet 11  . Turmeric 500 MG TABS Take 1 tablet by mouth daily.    . Vitamin D, Ergocalciferol, (DRISDOL) 50000 units CAPS capsule TAKE 1 CAPSULE (50,000 UNITS TOTAL) BY MOUTH EVERY 7 (SEVEN) DAYS. 12 capsule 0   No current facility-administered medications for this visit.     PHYSICAL EXAMINATION: ECOG PERFORMANCE STATUS: 1 - Symptomatic but completely ambulatory  There were no vitals filed for this visit. There were no vitals filed for this visit.  GENERAL:alert, no distress and comfortable SKIN:Several bruises throughout her body including some on her arms legs back and abdomen. Small palpable nodule on the right lateral abdominal wall superficially is the subcutaneous tissue possibly a small hematoma.  EYES: normal, Conjunctiva are pink and non-injected, sclera clear OROPHARYNX:no  exudate, no erythema and lips, buccal mucosa, and tongue normal  NECK: supple, thyroid normal size, non-tender, without nodularity LYMPH:  no palpable lymphadenopathy in the cervical, axillary or inguinal LUNGS: clear to auscultation and percussion with normal breathing effort HEART: regular rate & rhythm and no murmurs and no lower extremity edema ABDOMEN:abdomen soft, non-tender and normal bowel sounds MUSCULOSKELETAL:no cyanosis of digits and no clubbing  NEURO: alert & oriented x 3 with fluent speech, no focal motor/sensory deficits EXTREMITIES: No lower extremity edema  LABORATORY DATA:  I have reviewed the data as listed   Chemistry      Component Value Date/Time   NA 141 01/09/2016 0843   K 4.1 01/09/2016 0843   CL 99 (L) 09/09/2014 2336   CO2 25 01/09/2016 0843   BUN 14.1 01/09/2016 0843   CREATININE 0.6 01/09/2016 0843      Component Value Date/Time   CALCIUM 8.9 01/09/2016 0843   ALKPHOS 89 01/09/2016 0843   AST 22 01/09/2016 0843  ALT 26 01/09/2016 0843   BILITOT 0.41 01/09/2016 0843       Lab Results  Component Value Date   WBC 5.1 01/09/2016   HGB 12.9 01/09/2016   HCT 38.6 01/09/2016   MCV 96.7 01/09/2016   PLT 164 01/09/2016   NEUTROABS 1.9 01/09/2016    ASSESSMENT & PLAN:  Breast cancer of upper-outer quadrant of left female breast Left breast lumpectomy 08/08/2014: 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 Adjuvant chemotherapy with dose dense Adriamycin and Cytoxan x 4 followed by Abraxane weekly 12 because of lymph node positive disease. Started 09/04/14 to 01/15/15 2. adjuvant  radiation completed 03/20/15 3. Followed by antiestrogen therapy started 04/03/15 -------------------------------------------------------------------------------------------------------------------------- Anastrozole toxicities: Patient denies any hot flashes. 1. Complains of occasional myalgias 2. Weight gain: I believe the cause the weight gain as her dietary habits and she will work very hard to lose the pounds. I discussed with her that based on the BMI she is overweight.  Bone density 07/26/2015: T score -1.9, osteopenia  PALLAS Trial : Patient has been randomized to anastrozole alone. ABC clinical trial: The trial randomizes between aspirin versus placebo. She went on trial 07/26/2015  Adverse effects: 1. Bruising/ Hematoma 2. bleeding from mucous membranes especially GI, vaginal, nose, gums, nailbeds Based upon the clinical trial protocol this is grade 2 and will require dose reduction 200 mg (aspirin versus placebo study)  Cognitive dysfunction: On Aricept 10 mg daily. Memory function has improved  Weight gain: On Weight Watchers. Parotid nodules: Biopsy-proven to be lymphoid infiltrates. No malignant cells seen  Breast cancer surveillance: 1. Mammogram done 07/26/2015 is normal. 2. Breast exam 01/09/2016: No palpable lumps or nodules  Return to clinic at her previously scheduled appointment  I spent 25 minutes talking to the patient of which more than half was spent in counseling and coordination of care.  No orders of the defined types were placed in this encounter.  The patient has a good understanding of the overall plan. she agrees with it. she will call with any problems that may develop before the next visit here.   Rulon Eisenmenger, MD 03/20/16

## 2016-03-20 NOTE — Progress Notes (Signed)
03/20/2016 Patient in to clinic today after call in yesterday reporting multiple bruises over her body. Patient was seen and examined by Dr. Lindi Adie. Due to presence of grade 2 bleeding, patient was instructed to stop taking aspirin/placebo effective immediately. She is to return unused drug and completed drug diaries at her next office visit. Study treatment will be reduced to dose level -1 aspirin '100mg'$ /placebo, with anticipated resumption at next office visit, allowing time for drug ordering and delivery. Patient and physician are in agreement with this plan. [name] BSN, RN, CCRP 03/20/2016 8:51 AM

## 2016-03-25 ENCOUNTER — Ambulatory Visit: Payer: PRIVATE HEALTH INSURANCE | Admitting: Hematology and Oncology

## 2016-03-25 ENCOUNTER — Other Ambulatory Visit: Payer: PRIVATE HEALTH INSURANCE

## 2016-03-31 ENCOUNTER — Other Ambulatory Visit: Payer: Self-pay | Admitting: Family Medicine

## 2016-04-02 ENCOUNTER — Ambulatory Visit: Payer: PRIVATE HEALTH INSURANCE | Admitting: Hematology and Oncology

## 2016-04-02 ENCOUNTER — Other Ambulatory Visit: Payer: PRIVATE HEALTH INSURANCE

## 2016-04-03 ENCOUNTER — Telehealth: Payer: Self-pay | Admitting: Family Medicine

## 2016-04-03 NOTE — Telephone Encounter (Signed)
Spoke with pt regarding appt appt scheduled on Monday

## 2016-04-04 ENCOUNTER — Telehealth: Payer: Self-pay

## 2016-04-04 ENCOUNTER — Other Ambulatory Visit: Payer: Self-pay | Admitting: *Deleted

## 2016-04-04 NOTE — Telephone Encounter (Signed)
Called the patient and left a message for her to bring diaries and bottles for studies.

## 2016-04-06 NOTE — Assessment & Plan Note (Signed)
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 Adjuvant chemotherapy with dose dense Adriamycin and Cytoxan x 4 followed by Abraxane weekly 12 because of lymph node positive disease. Started 09/04/14 to 01/15/15 2. adjuvant radiation completed 03/20/15 3. Followed by antiestrogen therapy started 04/03/15 -------------------------------------------------------------------------------------------------------------------------- Anastrozole toxicities: Patient denies any hot flashes. 1. Complains of occasional myalgias 2. Weight gain: I believe the cause the weight gain as her dietary habits and she will work very hard to lose the pounds. I discussed with her that based on the BMI she is overweight.  Bone density 07/26/2015: T score -1.9, osteopenia  PALLAS Trial : Patient has been randomized to anastrozole alone. ABC clinical trial: The trial randomizes between aspirin versus placebo. She went on trial 07/26/2015  Adverse effects: 1. Bruising/ Hematoma 2. bleeding from mucous membranes especially GI, vaginal, nose, gums, nailbeds Based upon the clinical trial protocol this is grade 2 we reduced dose to 200 mg on 03/20/16 (aspirin versus placebo study)  Cognitive dysfunction: On Aricept 10 mg daily. Memory function has improved  Weight gain:On Weight Watchers. Parotid nodules: Biopsy-proven to be lymphoid infiltrates. No malignant cells seen  Breast cancer surveillance: 1. Mammogram done 07/26/2015 is normal. 2. Breast exam 01/09/2016: No palpable lumps or nodules

## 2016-04-07 ENCOUNTER — Encounter: Payer: PRIVATE HEALTH INSURANCE | Admitting: *Deleted

## 2016-04-07 ENCOUNTER — Encounter: Payer: Self-pay | Admitting: Hematology and Oncology

## 2016-04-07 ENCOUNTER — Ambulatory Visit (INDEPENDENT_AMBULATORY_CARE_PROVIDER_SITE_OTHER): Payer: PRIVATE HEALTH INSURANCE | Admitting: Family Medicine

## 2016-04-07 ENCOUNTER — Other Ambulatory Visit: Payer: Self-pay | Admitting: Hematology and Oncology

## 2016-04-07 ENCOUNTER — Encounter: Payer: Self-pay | Admitting: Family Medicine

## 2016-04-07 ENCOUNTER — Other Ambulatory Visit (HOSPITAL_BASED_OUTPATIENT_CLINIC_OR_DEPARTMENT_OTHER): Payer: PRIVATE HEALTH INSURANCE

## 2016-04-07 ENCOUNTER — Ambulatory Visit (HOSPITAL_BASED_OUTPATIENT_CLINIC_OR_DEPARTMENT_OTHER): Payer: PRIVATE HEALTH INSURANCE | Admitting: Hematology and Oncology

## 2016-04-07 VITALS — BP 122/78 | HR 70 | Temp 97.5°F | Ht 65.0 in | Wt 162.0 lb

## 2016-04-07 DIAGNOSIS — Z006 Encounter for examination for normal comparison and control in clinical research program: Secondary | ICD-10-CM

## 2016-04-07 DIAGNOSIS — Z79811 Long term (current) use of aromatase inhibitors: Secondary | ICD-10-CM

## 2016-04-07 DIAGNOSIS — Z170 Estrogen receptor positive status [ER+]: Secondary | ICD-10-CM | POA: Diagnosis not present

## 2016-04-07 DIAGNOSIS — C50412 Malignant neoplasm of upper-outer quadrant of left female breast: Secondary | ICD-10-CM

## 2016-04-07 DIAGNOSIS — Z923 Personal history of irradiation: Secondary | ICD-10-CM

## 2016-04-07 DIAGNOSIS — L905 Scar conditions and fibrosis of skin: Secondary | ICD-10-CM

## 2016-04-07 DIAGNOSIS — Z9221 Personal history of antineoplastic chemotherapy: Secondary | ICD-10-CM

## 2016-04-07 DIAGNOSIS — E559 Vitamin D deficiency, unspecified: Secondary | ICD-10-CM | POA: Diagnosis not present

## 2016-04-07 DIAGNOSIS — H8109 Meniere's disease, unspecified ear: Secondary | ICD-10-CM | POA: Diagnosis not present

## 2016-04-07 DIAGNOSIS — E7800 Pure hypercholesterolemia, unspecified: Secondary | ICD-10-CM | POA: Diagnosis not present

## 2016-04-07 LAB — COMPREHENSIVE METABOLIC PANEL
ALBUMIN: 4.1 g/dL (ref 3.5–5.0)
ALK PHOS: 80 U/L (ref 40–150)
ALT: 20 U/L (ref 0–55)
AST: 17 U/L (ref 5–34)
Anion Gap: 9 mEq/L (ref 3–11)
BUN: 8.1 mg/dL (ref 7.0–26.0)
CALCIUM: 9.2 mg/dL (ref 8.4–10.4)
CO2: 26 mEq/L (ref 22–29)
CREATININE: 0.6 mg/dL (ref 0.6–1.1)
Chloride: 106 mEq/L (ref 98–109)
EGFR: >90 mL/min/{1.73_m2} (ref >90)
Glucose: 102 mg/dl (ref 70–140)
Potassium: 3.7 mEq/L (ref 3.5–5.1)
Sodium: 141 mEq/L (ref 136–145)
TOTAL PROTEIN: 6.9 g/dL (ref 6.4–8.3)
Total Bilirubin: 0.5 mg/dL (ref 0.20–1.20)

## 2016-04-07 LAB — CBC WITH DIFFERENTIAL/PLATELET
BASO%: 0.5 % (ref 0.0–2.0)
BASOS ABS: 0 10*3/uL (ref 0.0–0.1)
EOS%: 1 % (ref 0.0–7.0)
Eosinophils Absolute: 0.1 10*3/uL (ref 0.0–0.5)
HEMATOCRIT: 39.1 % (ref 34.8–46.6)
HEMOGLOBIN: 13.4 g/dL (ref 11.6–15.9)
LYMPH#: 2.8 10*3/uL (ref 0.9–3.3)
LYMPH%: 47.5 % (ref 14.0–49.7)
MCH: 33.2 pg (ref 25.1–34.0)
MCHC: 34.3 g/dL (ref 31.5–36.0)
MCV: 96.9 fL (ref 79.5–101.0)
MONO#: 0.5 10*3/uL (ref 0.1–0.9)
MONO%: 8.2 % (ref 0.0–14.0)
NEUT#: 2.5 10*3/uL (ref 1.5–6.5)
NEUT%: 42.8 % (ref 38.4–76.8)
Platelets: 176 10*3/uL (ref 145–400)
RBC: 4.04 10*6/uL (ref 3.70–5.45)
RDW: 13.5 % (ref 11.2–14.5)
WBC: 5.9 10*3/uL (ref 3.9–10.3)

## 2016-04-07 LAB — HEMOGLOBIN A1C
Est. average glucose Bld gHb Est-mCnc: 97 mg/dL
HEMOGLOBIN A1C: 5 % (ref 4.8–5.6)

## 2016-04-07 MED ORDER — ANASTROZOLE 1 MG PO TABS: 1.0000 mg | ORAL_TABLET | Freq: Every day | ORAL | 3 refills | Status: DC

## 2016-04-07 MED ORDER — INV-ASPIRIN/PLACEBO 100 MG TABS ALLIANCE A011502: 1.0000 | ORAL_TABLET | Freq: Every day | ORAL | 0 refills | Status: DC

## 2016-04-07 MED ORDER — CLONAZEPAM 0.5 MG PO TABS: 0.5000 mg | ORAL_TABLET | Freq: Three times a day (TID) | ORAL | 5 refills | Status: DC | PRN

## 2016-04-07 MED ORDER — VITAMIN D (ERGOCALCIFEROL) 1.25 MG (50000 UNIT) PO CAPS: ORAL_CAPSULE | ORAL | 3 refills | Status: DC

## 2016-04-07 MED ORDER — PAROXETINE HCL ER 25 MG PO TB24: 25.0000 mg | ORAL_TABLET | Freq: Every day | ORAL | 3 refills | Status: DC

## 2016-04-07 NOTE — Patient Instructions (Addendum)
Continue current medications. Continue good therapeutic lifestyle changes which include good diet and exercise. Fall precautions discussed with patient. If an FOBT was given today- please return it to our front desk. If you are over 54 years old - you may need Prevnar 46 or the adult Pneumonia vaccine.  **Flu shots are available--- please call and schedule a FLU-CLINIC appointment**  After your visit with [place] today you will receive a survey in the mail or online from [employer] regarding your care with [place]. Please take a moment to fill this out. Your feedback is very important to [place] as you can help [place] better understand your patient needs as well as improve your experience and satisfaction. WE CARE ABOUT YOU!!!   Continue to follow-up regularly with oncology. Continue to drink plenty of fluids and stay well hydrated Continue to follow-up aggressive therapeutic lifestyle changes which means getting as much exercise as you can fit into your busy day.

## 2016-04-07 NOTE — Progress Notes (Signed)
Patient Care Team: Chipper Herb, MD as PCP - General (Family Medicine) Benson Norway, RN as Registered Nurse (Oncology) Nicholas Lose, MD as Consulting Physician (Hematology and Oncology) Coralie Keens, MD as Consulting Physician (General Surgery) Kyung Rudd, MD as Consulting Physician (Radiation Oncology) Sylvan Cheese, NP as Nurse Practitioner (Hematology and Oncology)  DIAGNOSIS:  Encounter Diagnosis  Name Primary?  . Malignant neoplasm of upper-outer quadrant of left breast in female, estrogen receptor positive ([place])     SUMMARY OF ONCOLOGIC HISTORY:   Breast cancer of upper-outer quadrant of left female breast ([place])   07/18/2014 Mammogram    Distortion left breast, breast density category C; U/S 1.3 x 0.8 x 0.8 cm left breast mass at 2:00 position 4 cm from nipple, no lymph nodes      07/20/2014 Initial Diagnosis    Left breast Biopsy: Invasive lobular cancer with LCIS, Grade 1, ER 86%, PR 78%, Her 2 Neg Ratio 1.77, Ki 67: 14%      08/01/2014 Breast MRI    Breast MRI showed non-mass enhancement 3.9 cm, no lymph nodes      08/01/2014 Clinical Stage    Stage IIA: T2 N0      08/08/2014 Surgery    Left breast lumpectomy: 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, with LCIS, medial and inferior margins positive, 3/4 lymph nodes positive      08/08/2014 Pathologic Stage    Stage IIB: T2 N1c M0      08/16/2014 Surgery    Left breast medial margin reexcision residual ILC 0.2 cm; inferior margin residual foci less than 0.2 cm, 1/5 lymph nodes positive, 1 lymph node with isolated tumor cells (Overall 4/10)      09/04/2014 - 01/15/2015 Chemotherapy    Adjuvant chemotherapy with dose dense Adriamycin and Cytoxan 4 followed by Abraxane weekly 8 ( stopped early for profound neutropenia and thrombocytopenia)      01/29/2015 - 03/20/2015 Radiation Therapy    Adjuvant XRT [facility]): 50.4 Gy over 28 fractions; seroma boost: 10 Gy over 5 fractions. Total dose: 60.4 Gy      04/11/2015 -  Anti-estrogen oral therapy    Anastrozole 1 mg daily (PALLAS clinical trial 05/24/2015 patient was randomized to hormone therapy alone)      05/24/2015 Survivorship    Survivorship visit completed and copy of care plan given to patient       CHIEF COMPLIANT: Follow-up of bruising related to aspirin trial  INTERVAL HISTORY: [name] is a 56 year old with above-mentioned history of left breast cancer treated with surgery followed by adjuvant chemotherapy and radiation. She is currently on anastrozole and is tolerating it extremely well. She also enrolled in the ABC clinical trial and she had lots of bruising and she had to have dose reduction. She is here for recheck of the bruising. She has no new bruises. The previous bruises have nearly disappeared. She has a small knot subcutaneously in the right lateral aspect of her belly. This has not completely gone away. It's slightly tender. She also feels tenderness in the left breast along the surgical scar.  REVIEW OF SYSTEMS:   Constitutional: Denies fevers, chills or abnormal weight loss Eyes: Denies blurriness of vision Ears, nose, mouth, throat, and face: Denies mucositis or sore throat Respiratory: Denies cough, dyspnea or wheezes Cardiovascular: Denies palpitation, chest discomfort Gastrointestinal:  Denies nausea, heartburn or change in bowel habits Skin: Denies abnormal skin rashes Lymphatics: Denies new lymphadenopathy or easy bruising Neurological:Denies numbness, tingling or  new weaknesses Behavioral/Psych: Mood is stable, no new changes  Extremities: No lower extremity edema Breast: Slight tenderness in the surgical scar the left breast All other systems were reviewed with the patient and are negative.  I have reviewed the past medical history, past surgical history, social history and family history with the patient and they are unchanged from previous note.  ALLERGIES:  is allergic to statins; crestor  [rosuvastatin]; lipitor [atorvastatin]; and temazepam.  MEDICATIONS:  Current Outpatient Prescriptions  Medication Sig Dispense Refill  . acetaminophen (TYLENOL) 325 MG tablet Take 975 mg by mouth daily as needed for moderate pain. Usually takes twice daily in morning and bedtime and additionally during the day if needed for pain    . anastrozole (ARIMIDEX) 1 MG tablet Take 1 tablet (1 mg total) by mouth daily. 90 tablet 3  . Biotin 1000 MCG tablet Take 1,000 mcg by mouth daily.    . clonazePAM (KLONOPIN) 0.5 MG tablet TAKE 1 TABLET BY MOUTH 3 TIMES A DAY AS NEEDED 90 tablet 0  . donepezil (ARICEPT) 10 MG tablet Take 1 tablet (10 mg total) by mouth at bedtime. 90 tablet 3  . Investigational aspirin/placebo 300 MG tablet Alliance [id number] Take 1 tablet by mouth daily. Take with food or a full glass of water.  Do not crush enteric coated tablets. 180 tablet 0  . OVER THE COUNTER MEDICATION Take 1 capsule by mouth daily. Omega-3 SLIM supplement    . OVER THE COUNTER MEDICATION Take 1 capsule by mouth 2 (two) times daily. Innate Response Formulas Inflama-Complete    . PARoxetine (PAXIL-CR) 25 MG 24 hr tablet Take 1 tablet (25 mg total) by mouth daily. 30 tablet 11  . Turmeric 500 MG TABS Take 1 tablet by mouth daily.    . Vitamin D, Ergocalciferol, (DRISDOL) 50000 units CAPS capsule TAKE 1 CAPSULE (50,000 UNITS TOTAL) BY MOUTH EVERY 7 (SEVEN) DAYS. 12 capsule 0   No current facility-administered medications for this visit.     PHYSICAL EXAMINATION: ECOG PERFORMANCE STATUS: 1 - Symptomatic but completely ambulatory  Vitals:   04/07/16 0842  BP: (!) 141/68  Pulse: 69  Resp: 18  Temp: 98.3 F (36.8 C)   Filed Weights   04/07/16 0842  Weight: 162 lb 8 oz (73.7 kg)    GENERAL:alert, no distress and comfortable SKIN: skin color, texture, turgor are normal, no rashes or significant lesions EYES: normal, Conjunctiva are pink and non-injected, sclera clear OROPHARYNX:no exudate, no erythema  and lips, buccal mucosa, and tongue normal  NECK: supple, thyroid normal size, non-tender, without nodularity LYMPH:  no palpable lymphadenopathy in the cervical, axillary or inguinal LUNGS: clear to auscultation and percussion with normal breathing effort HEART: regular rate & rhythm and no murmurs and no lower extremity edema ABDOMEN:abdomen soft, non-tender and normal bowel sounds MUSCULOSKELETAL:no cyanosis of digits and no clubbing  NEURO: alert & oriented x 3 with fluent speech, no focal motor/sensory deficits EXTREMITIES: No lower extremity edema BREAST:Postsurgical changes related to breast reduction on the right breast as well as a left lumpectomy scar. There is tenderness around the scars. The small nodularity adjacent to the left lumpectomy scar. I do not have any concern for recurrent disease. No palpable axillary supraclavicular or infraclavicular adenopathy no breast tenderness or nipple discharge. (exam performed in the presence of a chaperone)  LABORATORY DATA:  I have reviewed the data as listed   Chemistry      Component Value Date/Time   NA 141 04/07/2016 0813  K 3.7 04/07/2016 0813   CL 99 (L) 09/09/2014 2336   CO2 26 04/07/2016 0813   BUN 8.1 04/07/2016 0813   CREATININE 0.6 04/07/2016 0813      Component Value Date/Time   CALCIUM 9.2 04/07/2016 0813   ALKPHOS 80 04/07/2016 0813   AST 17 04/07/2016 0813   ALT 20 04/07/2016 0813   BILITOT 0.50 04/07/2016 0813       Lab Results  Component Value Date   WBC 5.9 04/07/2016   HGB 13.4 04/07/2016   HCT 39.1 04/07/2016   MCV 96.9 04/07/2016   PLT 176 04/07/2016   NEUTROABS 2.5 04/07/2016    ASSESSMENT & PLAN:  Breast cancer of upper-outer quadrant of left female breast Left breast lumpectomy 08/08/2014: 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, with LCIS, medial and inferior margins positive, 3/4 lymph nodes positive T2 N1 cM0 stage IIB, ER 86%, PR 78%, HER-2/neu negative ratio 1.77, Ki-67 14% Left breast  medial margin reexcision 08/18/14: residual ILC 0.2 cm; inferior margin residual foci less than 0.2 cm, 1/5 lymph nodes positive, 1 lymph node with isolated tumor cells (Overall 4/10)  Treatment Summary: 1. Adjuvant chemotherapy with dose dense Adriamycin and Cytoxan x 4 followed by Abraxane weekly 12 because of lymph node positive disease. Started 09/04/14 to 01/15/15 2. adjuvant radiation completed 03/20/15 3. Followed by antiestrogen therapy started 04/03/15 -------------------------------------------------------------------------------------------------------------------------- Anastrozole toxicities: Patient denies any hot flashes. 1. Complains of occasional myalgias 2. Weight gain: I believe the cause the weight gain as her dietary habits and she will work very hard to lose the pounds. I discussed with her that based on the BMI she is overweight.  Bone density 07/26/2015: T score -1.9, osteopenia  PALLAS Trial : Patient has been randomized to anastrozole alone.  ABC clinical trial: The trial randomizes between aspirin versus placebo. She went on trial 07/26/2015  Adverse effects: 1. Bruising/ Hematoma: Resolved after dose reduction. She is currently going to be on 100 mg of aspirin/placebo 2. bleeding from mucous membranes especially GI, vaginal, nose, gums, nailbeds: Resolved  Cognitive dysfunction: On Aricept 10 mg daily. Memory function has improved although she feels that short-term memory could be better  Weight gain:On Weight Watchers.I instructed the patient to start exercising as well. Parotid nodules: Biopsy-proven to be lymphoid infiltrates. No malignant cells seen  Breast cancer surveillance: 1. Mammogram done 07/26/2015 is normal. 2. Breast exam  04/07/2016: No palpable lumps or nodules of concern.   I spent 25 minutes talking to the patient of which more than half was spent in counseling and coordination of care.  No orders of the defined types were placed in  this encounter.  The patient has a good understanding of the overall plan. she agrees with it. she will call with any problems that may develop before the next visit here.   Rulon Eisenmenger, MD 04/07/16

## 2016-04-07 NOTE — Progress Notes (Signed)
Subjective:    Patient ID: [name], female    DOB: March 21, 1960, 56 y.o.   MRN: [id number]  HPI Pt here for follow up and management of chronic medical problems which includes hyperlipidemia and Meniere's. She is taking medication regularly.As patient has had breast cancer. She is being followed regularly by her oncologist and her surgeon. She is requesting refills on her vitamin D, her Paxil, and her clonazepam. She complains today of insomnia. She is due to get lab work because she has had a long history of hyperlipidemia. The patient denies any chest pain or shortness of breath. She recently was coughing some and had a hematoma on her right anterior chest wall and this seems to be getting smaller she is reassured about that. She has no trouble with swallowing heartburn indigestion nausea vomiting diarrhea or blood in the stool. She is passing her water without problems. She gets mammograms every 6 months and sees the oncologist every 3 months.    Patient Active Problem List   Diagnosis Date Noted  . Easy bruising 03/20/2016  . 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 Anemia associated with chemotherapy 10/24/2014  . Breast cancer of upper-outer quadrant of left female breast ([place]) 07/31/2014  . Hyperlipidemia 09/06/2012  . Generalized anxiety disorder 09/06/2012  . Meniere's disease 09/06/2012  . Statin intolerance 09/06/2012  . Left rotator cuff tear 09/06/2012   Outpatient Encounter Prescriptions as of 04/07/2016  Medication Sig  . acetaminophen (TYLENOL) 325 MG tablet Take 975 mg by mouth daily as needed for moderate pain. Usually takes twice daily in morning and bedtime and additionally during the day if needed for pain  . anastrozole (ARIMIDEX) 1 MG tablet Take 1 tablet (1 mg total) by mouth daily.  . Biotin 1000 MCG tablet Take 1,000 mcg by mouth daily.  . clonazePAM (KLONOPIN) 0.5 MG tablet TAKE 1 TABLET BY MOUTH 3 TIMES A DAY AS NEEDED  . donepezil (ARICEPT) 10 MG tablet Take 1  tablet (10 mg total) by mouth at bedtime.  . Investigational - Study Medication Study name: unknown Additional study details: AsA - trial  . PARoxetine (PAXIL-CR) 25 MG 24 hr tablet Take 1 tablet (25 mg total) by mouth daily.  . Turmeric 500 MG TABS Take 1 tablet by mouth daily.  . Vitamin D, Ergocalciferol, (DRISDOL) 50000 units CAPS capsule TAKE 1 CAPSULE (50,000 UNITS TOTAL) BY MOUTH EVERY 7 (SEVEN) DAYS.  . [DISCONTINUED] Investigational aspirin/placebo 300 MG tablet Alliance [id number] Take 1 tablet by mouth daily. Take with food or a full glass of water.  Do not crush enteric coated tablets.  . [DISCONTINUED] OVER THE COUNTER MEDICATION Take 1 capsule by mouth daily. Omega-3 SLIM supplement  . [DISCONTINUED] OVER THE COUNTER MEDICATION Take 1 capsule by mouth 2 (two) times daily. Innate Response Formulas Inflama-Complete   No facility-administered encounter medications on file as of 04/07/2016.       Review of Systems  Constitutional: Negative.        Not sleeping well  HENT: Negative.   Eyes: Negative.   Respiratory: Negative.   Cardiovascular: Negative.   Gastrointestinal: Negative.   Endocrine: Negative.   Genitourinary: Negative.   Musculoskeletal: Negative.   Skin: Negative.   Allergic/Immunologic: Negative.   Neurological: Negative.   Hematological: Negative.   Psychiatric/Behavioral: Negative.        Objective:   Physical Exam  Constitutional: She is oriented to person, place, and time. She appears well-developed and well-nourished. No distress.  HENT:  Head: Normocephalic  and atraumatic.  Right Ear: External ear normal.  Left Ear: External ear normal.  Mouth/Throat: Oropharynx is clear and moist.  Nasal congestion and turbinate swelling bilaterally  Eyes: Conjunctivae and EOM are normal. Pupils are equal, round, and reactive to light. Right eye exhibits no discharge. Left eye exhibits no discharge. No scleral icterus.  Neck: Normal range of motion. Neck supple. No  thyromegaly present.  No bruits thyromegaly or anterior cervical adenopathy  Cardiovascular: Normal rate, regular rhythm, normal heart sounds and intact distal pulses.   No murmur heard. Heart has a regular rate and rhythm at 72/m  Pulmonary/Chest: Effort normal and breath sounds normal. No respiratory distress. She has no wheezes. She has no rales.  Clear anteriorly and posteriorly  Abdominal: Soft. Bowel sounds are normal. She exhibits no mass. There is no tenderness. There is no rebound and no guarding.  No liver or spleen enlargement no bruits no masses no suprapubic tenderness  Musculoskeletal: Normal range of motion. She exhibits no edema.  Lymphadenopathy:    She has no cervical adenopathy.  Neurological: She is alert and oriented to person, place, and time. She has normal reflexes. No cranial nerve deficit.  Skin: Skin is warm and dry. No rash noted.  Psychiatric: She has a normal mood and affect. Her behavior is normal. Judgment and thought content normal.  Nursing note and vitals reviewed.  BP 122/78 (BP Location: Left Arm)   Pulse 70   Temp 97.5 F (36.4 C) (Oral)   Ht '5\' 5"'$  (1.651 m)   Wt 162 lb (73.5 kg)   BMI 26.96 kg/m         Assessment & Plan:  1. Pure hypercholesterolemia -Continue with as aggressive therapeutic lifestyle changes as possible which include diet and exercise - Lipid panel  2. Vitamin D deficiency -Continue vitamin D replacement pending results of lab work - VITAMIN D 25 Hydroxy (Vit-D Deficiency, Fractures)  3. Meniere's disease, unspecified laterality -The patient is doing well with this currently and no change in treatment  4. Malignant neoplasm of upper-outer quadrant of left breast in female, estrogen receptor positive ([place]) -Continue to follow-up regularly with oncologist and get mammograms  Meds ordered this encounter  Medications  . Investigational - Study Medication    Sig: Study name: unknown Additional study details: AsA -  trial  . clonazePAM (KLONOPIN) 0.5 MG tablet    Sig: Take 1 tablet (0.5 mg total) by mouth 3 (three) times daily as needed.    Dispense:  90 tablet    Refill:  5    Not to exceed 4 additional fills before 05/31/2016.  . Vitamin D, Ergocalciferol, (DRISDOL) 50000 units CAPS capsule    Sig: TAKE 1 CAPSULE (50,000 UNITS TOTAL) BY MOUTH EVERY 7 (SEVEN) DAYS.    Dispense:  12 capsule    Refill:  3  . PARoxetine (PAXIL-CR) 25 MG 24 hr tablet    Sig: Take 1 tablet (25 mg total) by mouth daily.    Dispense:  90 tablet    Refill:  3   Patient Instructions  Continue current medications. Continue good therapeutic lifestyle changes which include good diet and exercise. Fall precautions discussed with patient. If an FOBT was given today- please return it to our front desk. If you are over 4 years old - you may need Prevnar 73 or the adult Pneumonia vaccine.  **Flu shots are available--- please call and schedule a FLU-CLINIC appointment**  After your visit with [place] today you will receive a  survey in the mail or online from [employer] regarding your care with [place]. Please take a moment to fill this out. Your feedback is very important to [place] as you can help [place] better understand your patient needs as well as improve your experience and satisfaction. WE CARE ABOUT YOU!!!   Continue to follow-up regularly with oncology. Continue to drink plenty of fluids and stay well hydrated Continue to follow-up aggressive therapeutic lifestyle changes which means getting as much exercise as you can fit into your busy day.   Arrie Senate MD

## 2016-04-07 NOTE — Progress Notes (Signed)
04/07/2016 Patient in to clinic unaccompanied today for AFT-05 PALLAS visit (Cycle 12, Day 1) and Alliance [phone] ABC interim visit for dispensing reduced dose of study medication due to adverse event. Based on lab results review and physical exam, Dr. Lindi Adie cleared the patient to continue treatment on the PALLAS and ABC trials as described below.   AFT-05 PALLAS - Cycle 12, Day 1 Upon arrival to the clinic, PRO and Adherence Questionnaires were completed independently by the patient. Of note, patient states that she answered adherence questionnaire regarding missed doses, as pertaining to her entire time on study. Patient returned her completed anti-hormone therapy drug diary for Cycles 8, as this was not yet completed at the time of her Cycle 9 visit (one dose remaining). Patient returned her completed anti-hormone therapy drug diaries for Cycles 9 through 11. Due to patient appointment change related to scheduling, but within the protocol requirement for visit window within 7 days, Cycle 12 start was delayed until today. Accordingly, patient medication doses for 04/03/16 - 04/06/16 were recorded on the back of the Cycle 11 diary. Patient was given drug diaries for Cycles 12, 13 and 14 of anti-hormone therapy, beginning with today's date. Instructed patient to return the completed diaries at the next study visit, to be scheduled for 07/07/2016 to coordinate with the window for the ABC study, and within the 7 day window allowed per PALLAS protocol. See Adverse Event Log table below regarding AEs. When questioned further about her surgical site pain, patient reports that she only took prescription pain medication for a day, then just took Tylenol as needed. She does continue to have intermittent surgical site pain, though it is mild in nature.  Alliance [id number] ABC - Interim visit Patient returned previously dispensed medication bottles containing aspirin '300mg'$ /placebo as follows: . Initial dispensing on  07/26/2015 - both bottles returned empty. . Month 6 dispensing on 01/09/2016 - one unopened bottle of 90 tables and one partial bottle containing 36 tablets, for a total return of 126 tablets. Month-Yr # pills dispensed Doses/mo. Total   May-17 180 14    Jun-17  30    Jul-17  31    Aug-17  31    Sep-17  30    Oct-17  31    Nov-17 180 30    Dec-17  31    Jan-18  10    Total 360 238 122 Expected return     126 Actual return  Patient returned completed medication logs for November through January noting last dose taken on 03/19/2016. Although medication log does not document any missing doses, based on expected pill count return of 122 tablets, compared to actual pill count return of 126 tablets, four doses were missed. Patient reports no further episodes of bleeding or bruising since stopping study medication effective 03/20/2016, and prior bruises are resolving. Two new bottles of study medication (aspirin '100mg'$ /placebo x 90 tablets per bottle) were dispensed by pharmacist, Romualdo Bolk and were given to the patient for continued dosing through next study visit in May. Patient was given blank copies of medication logs for January through May at the reduced dose of aspirin '100mg'$ /placebo. She will resume daily dosing of study medication with one tablet per day, beginning today. Patient instructed to return pill bottles and diaries at next clinic visit, expected to occur on 07/07/2016. [name] BSN, RN, CCRP 04/07/2016 1:33 PM   Adverse Event Log - AFT-05 PALLAS Cycles 9-11 and Alliance [id number] Interim Month 12 Baseline AEs (grade):  dizziness, int. (1); insomnia (1); anxiety (1); hiccups, int. (1); lower extremity pain, int. (2) Event Grade Onset Date Resolved Date Attribution to anastrozole Attribution to aspirin/placebo Treatment Comments  Memory impairment 2 01/09/2016 ongoing No Unrelated Aricept Patient feels it is slightly improved, but still moderate in nature,   Musculoskeletal aches 1  01/09/2016 ongoing Yes Not reportable None Improved to grade 1 on 01/09/16.  Bruising 1 08/25/2015 03/19/16 No Probable None    Bruising 2 03/20/2016 ongoing No Probable None  04/07/16 resolving  Muscle stiffness 1 01/09/16 03/2016 Yes Not reportable None   Hypertension 2 10/17/2015 ongoing No Unrelated None   Surgical site pain, int. 1 01/09/2016 ongoing No Not reportable Pain med & Tylenol   Weight gain 1 12/20/2015 ongoing Yes Not reportable None    Osteopenia 1 07/26/2015 Ongoing Yes Not reportable None    Heartburn, int. 1 12/09/2015 Ongoing Yes Possible None   Constipation, int. 1 12/2015 Ongoing Yes Not reportable None   Hemorrhoids 1 12/2015 Ongoing No Not reportable None   Hemorrhoidal bleeding, int. 1 12/2015 Ongoing No Definite None   Vaginal bleeding, int. 1 03/2016 Ongoing Yes Unlikely None   Hematoma, abd. wall 1 03/20/2016 Ongoing No Unlikely None   Elevated cholesterol 1 04/07/2016 Ongoing Yes Not reportable None   Elevated triglycerides 1 04/07/2016 Ongoing Yes Not reportable None   [name] BSN, RN, CCRP 06/05/2016 4:07 PM

## 2016-04-08 LAB — LIPID PANEL
CHOL/HDL RATIO: 5.3 ratio — AB (ref 0.0–4.4)
Cholesterol, Total: 258 mg/dL — ABNORMAL HIGH (ref 100–199)
HDL: 49 mg/dL (ref >39)
LDL CALC: 179 mg/dL — AB (ref 0–99)
Triglycerides: 152 mg/dL — ABNORMAL HIGH (ref 0–149)
VLDL CHOLESTEROL CAL: 30 mg/dL (ref 5–40)

## 2016-04-08 LAB — VITAMIN D 25 HYDROXY (VIT D DEFICIENCY, FRACTURES): Vit D, 25-Hydroxy: 37.6 ng/mL (ref 30.0–100.0)

## 2016-04-12 NOTE — Progress Notes (Signed)
Adverse Event Log - Alliance [id number] ABC Month 6 visit Event Grade Attribution to aspirin/placebo Comments  Heartburn, int. Grade 1 Possible   Constipation Grade 1 Unrelated   Hemorrhoids Grade 1 Unrelated   Hemorrhoidal hemorrhage Grade 1 Definite   [name] BSN, RN, [place] 04/12/2016 12:01 PM

## 2016-05-13 ENCOUNTER — Telehealth: Payer: Self-pay | Admitting: Hematology and Oncology

## 2016-05-13 NOTE — Telephone Encounter (Signed)
FAXED LAST OFFICE NOTE TO [facility] CARE [phone]

## 2016-05-14 ENCOUNTER — Telehealth: Payer: Self-pay | Admitting: Hematology and Oncology

## 2016-05-14 NOTE — Telephone Encounter (Signed)
Faxed records to [facility] care  [phone]

## 2016-05-15 ENCOUNTER — Other Ambulatory Visit: Payer: Self-pay

## 2016-05-15 ENCOUNTER — Other Ambulatory Visit: Payer: Self-pay | Admitting: Hematology and Oncology

## 2016-05-15 DIAGNOSIS — C50412 Malignant neoplasm of upper-outer quadrant of left female breast: Secondary | ICD-10-CM

## 2016-05-15 DIAGNOSIS — Z170 Estrogen receptor positive status [ER+]: Principal | ICD-10-CM

## 2016-05-15 NOTE — Progress Notes (Signed)
Pt scheduled for CT of chest tomorrow at 5pm. Called to notify pt. Pt confirmed her appt. Instructed pt that she will need to come in at 230pm to pick up her oral contrast at the cancer center. She will need to drink one bottle of contrast at 3p and one at 4p. Instructed pt that she will need to be on clear liquids at 1pm prior to CT. Pt verbalized instructions back and aware that she will need to have test done at [facility]. Pt would like to know her results tomorrow asap and would like for Dr.Gudena to call her back if at all possible. Told pt that I will get this request to Dr.Gudena as discussed. Pt appreciative for making her CT appt right away.

## 2016-05-16 ENCOUNTER — Ambulatory Visit (HOSPITAL_COMMUNITY)
Admission: RE | Admit: 2016-05-16 | Discharge: 2016-05-16 | Disposition: A | Discharge status: Home / Self Care | Payer: PRIVATE HEALTH INSURANCE | Source: Ambulatory Visit | Attending: Hematology and Oncology | Admitting: Hematology and Oncology

## 2016-05-16 ENCOUNTER — Other Ambulatory Visit: Payer: Self-pay

## 2016-05-16 ENCOUNTER — Telehealth: Payer: Self-pay | Admitting: *Deleted

## 2016-05-16 DIAGNOSIS — I700 Atherosclerosis of aorta: Secondary | ICD-10-CM | POA: Diagnosis not present

## 2016-05-16 DIAGNOSIS — C50412 Malignant neoplasm of upper-outer quadrant of left female breast: Secondary | ICD-10-CM | POA: Insufficient documentation

## 2016-05-16 DIAGNOSIS — R911 Solitary pulmonary nodule: Secondary | ICD-10-CM | POA: Insufficient documentation

## 2016-05-16 DIAGNOSIS — Z170 Estrogen receptor positive status [ER+]: Secondary | ICD-10-CM | POA: Insufficient documentation

## 2016-05-16 MED ORDER — IOPAMIDOL (ISOVUE-300) INJECTION 61%
INTRAVENOUS | Status: AC
  Filled 2016-05-16: qty 75

## 2016-05-16 MED ORDER — IOPAMIDOL (ISOVUE-300) INJECTION 61%
75.0000 mL | Freq: Once | INTRAVENOUS | Status: AC | PRN
  Administered 2016-05-16: 75 mL via INTRAVENOUS

## 2016-05-16 MED ORDER — AMOXICILLIN 500 MG PO TABS: 500.0000 mg | ORAL_TABLET | Freq: Two times a day (BID) | ORAL | 0 refills | Status: DC

## 2016-05-16 NOTE — Telephone Encounter (Signed)
Patient called wanting to remind MD Lindi Adie that she will be getting her scan this afternoon and would like to know if he could call her after results are in via mobile #. Patient is also going on a business trip on Sunday and is currently dealing with a sore throat and would like MD Gudena to call in an antibiotic in for her just in case she would get sick during this trip.

## 2016-05-16 NOTE — Telephone Encounter (Signed)
Will notify Dr.Gudena and call pt.thank you

## 2016-05-16 NOTE — Progress Notes (Signed)
Obtained antibiotic treatment for 1 week per Dr.Gudena's orders. Called to notify pt and will pick up her prescription tonight. Notified pt that Dr.Gudena will call her with results of her CT. Pt verbalized understanding and appreciative of time and call. No further concerns at this time.

## 2016-05-26 ENCOUNTER — Other Ambulatory Visit: Payer: Self-pay | Admitting: Hematology and Oncology

## 2016-05-26 DIAGNOSIS — J40 Bronchitis, not specified as acute or chronic: Secondary | ICD-10-CM | POA: Insufficient documentation

## 2016-05-26 MED ORDER — AMOXICILLIN 500 MG PO CAPS: 500.0000 mg | ORAL_CAPSULE | Freq: Two times a day (BID) | ORAL | 0 refills | Status: DC

## 2016-07-03 ENCOUNTER — Telehealth: Payer: Self-pay

## 2016-07-03 NOTE — Telephone Encounter (Signed)
Pt called with the information as noted per email for 07/07/16:  Please call patient today or tomorrow and remind her to bring the following: - PALLAS anti-hormone therapy diaries - ABC aspirin/placebo medication logs - ABC aspirin/placebo pill bottles (2 bottles)  Thanks! Park Breed

## 2016-07-04 ENCOUNTER — Other Ambulatory Visit: Payer: Self-pay | Admitting: *Deleted

## 2016-07-04 DIAGNOSIS — Z170 Estrogen receptor positive status [ER+]: Principal | ICD-10-CM

## 2016-07-04 DIAGNOSIS — C50412 Malignant neoplasm of upper-outer quadrant of left female breast: Secondary | ICD-10-CM

## 2016-07-07 ENCOUNTER — Encounter: Payer: Self-pay | Admitting: Hematology and Oncology

## 2016-07-07 ENCOUNTER — Other Ambulatory Visit (HOSPITAL_BASED_OUTPATIENT_CLINIC_OR_DEPARTMENT_OTHER): Payer: PRIVATE HEALTH INSURANCE

## 2016-07-07 ENCOUNTER — Encounter: Payer: PRIVATE HEALTH INSURANCE | Admitting: *Deleted

## 2016-07-07 ENCOUNTER — Ambulatory Visit (HOSPITAL_BASED_OUTPATIENT_CLINIC_OR_DEPARTMENT_OTHER): Payer: PRIVATE HEALTH INSURANCE | Admitting: Hematology and Oncology

## 2016-07-07 ENCOUNTER — Telehealth: Payer: Self-pay | Admitting: Family Medicine

## 2016-07-07 DIAGNOSIS — Z006 Encounter for examination for normal comparison and control in clinical research program: Secondary | ICD-10-CM | POA: Diagnosis not present

## 2016-07-07 DIAGNOSIS — G9389 Other specified disorders of brain: Secondary | ICD-10-CM | POA: Diagnosis not present

## 2016-07-07 DIAGNOSIS — I890 Lymphedema, not elsewhere classified: Secondary | ICD-10-CM

## 2016-07-07 DIAGNOSIS — C50412 Malignant neoplasm of upper-outer quadrant of left female breast: Secondary | ICD-10-CM

## 2016-07-07 DIAGNOSIS — Z170 Estrogen receptor positive status [ER+]: Secondary | ICD-10-CM

## 2016-07-07 DIAGNOSIS — Z79811 Long term (current) use of aromatase inhibitors: Secondary | ICD-10-CM | POA: Diagnosis not present

## 2016-07-07 LAB — CBC WITH DIFFERENTIAL/PLATELET
BASO%: 0.4 % (ref 0.0–2.0)
Basophils Absolute: 0 10*3/uL (ref 0.0–0.1)
EOS%: 2 % (ref 0.0–7.0)
Eosinophils Absolute: 0.1 10*3/uL (ref 0.0–0.5)
HCT: 40.6 % (ref 34.8–46.6)
HGB: 13.7 g/dL (ref 11.6–15.9)
LYMPH%: 45.2 % (ref 14.0–49.7)
MCH: 32.7 pg (ref 25.1–34.0)
MCHC: 33.7 g/dL (ref 31.5–36.0)
MCV: 96.9 fL (ref 79.5–101.0)
MONO#: 0.4 10*3/uL (ref 0.1–0.9)
MONO%: 8.5 % (ref 0.0–14.0)
NEUT#: 2.2 10*3/uL (ref 1.5–6.5)
NEUT%: 43.9 % (ref 38.4–76.8)
Platelets: 173 10*3/uL (ref 145–400)
RBC: 4.19 10*6/uL (ref 3.70–5.45)
RDW: 12.9 % (ref 11.2–14.5)
WBC: 4.9 10*3/uL (ref 3.9–10.3)
lymph#: 2.2 10*3/uL (ref 0.9–3.3)

## 2016-07-07 LAB — COMPREHENSIVE METABOLIC PANEL
ALBUMIN: 4.3 g/dL (ref 3.5–5.0)
ALK PHOS: 82 U/L (ref 40–150)
ALT: 23 U/L (ref 0–55)
ANION GAP: 10 meq/L (ref 3–11)
AST: 21 U/L (ref 5–34)
BILIRUBIN TOTAL: 0.76 mg/dL (ref 0.20–1.20)
BUN: 13.3 mg/dL (ref 7.0–26.0)
CALCIUM: 9.3 mg/dL (ref 8.4–10.4)
CO2: 27 mEq/L (ref 22–29)
Chloride: 104 mEq/L (ref 98–109)
Creatinine: 0.7 mg/dL (ref 0.6–1.1)
Glucose: 101 mg/dl (ref 70–140)
POTASSIUM: 3.8 meq/L (ref 3.5–5.1)
Sodium: 141 mEq/L (ref 136–145)
TOTAL PROTEIN: 7.3 g/dL (ref 6.4–8.3)

## 2016-07-07 MED ORDER — INV-ASPIRIN/PLACEBO 100 MG TABS ALLIANCE A011502: 1.0000 | ORAL_TABLET | Freq: Every day | ORAL | 0 refills | Status: DC

## 2016-07-07 MED ORDER — FUROSEMIDE 20 MG PO TABS: 20.0000 mg | ORAL_TABLET | Freq: Every day | ORAL | 3 refills | Status: DC

## 2016-07-07 NOTE — Progress Notes (Signed)
07/07/2016  Patient in to clinic unaccompanied today for AFT-05 PALLAS visit (Cycle 15) and Alliance [id number] ABC Month 12 visit. Due to coordination with overlapping visit windows for both studies, PALLAS Cycle 15 visit is occurring +7 days from cycle start, as allowed per protocol, and ABC study visit is occurring -14 days from expected visit date, as allowed per protocol window. Based on lab results review and physical exam, Dr. Lindi Adie cleared the patient to continue treatment on the PALLAS and ABC trials as described below.   AFT-05 PALLAS - Cycle 15, Day 1 Patient returned her completed anti-hormone therapy drug diary for Cycles 12 through 14. Due to patient appointment change related to scheduling, but within the protocol requirement for visit window within 7 days, Cycle 15 doses for Days 1-7 were recorded on the back of the Cycle 14 diary, and were transcribed onto the Cycle 15 diary by the patient today. Patient was given drug diaries for Cycles 15, 16 and 17 of anti-hormone therapy, beginning with 06/30/2016. Instructed patient to return the completed diaries at the next study visit, to be scheduled for 09/22/2016. See Adverse Event Log table below regarding AEs.  Alliance [phone] ABC - Month 12 visit Patient returned previously dispensed medication bottles containing aspirin '100mg'$ /placebo from the Month 6 interim dispensing (due to dose change) with one empty bottle and one partial bottle containing 88 tablets, as confirmed by pharmacist Mosetta Pigeon. Patient denies any missed doses, and reports that the second bottle was opened last night, with only one dose taken from that bottle.  Month-Yr # pills dispensed Doses/mo. Total    Jan-18 180 3     Feb-18  28     Mar-18  31     Apr-18  29     Total 180 91 89 Expected return     88 Actual return     Patient returned completed medication logs for 04/07/2016 through April 2018. Due to April 30th dose remaining to be recorded this evening, a copy  of the April medication log was retained by the site. The original was returned to the patient, along with a pre-addressed, postage-paid envelope for the [facility] Department. Patient to complete log with today's dose, then she will fax or email a copy of the log, with the original to be sent via [place] Mail.  Patient reports mild bruising (grade 1) again associated with working in the yard. She has had occasional episodes of heartburn. She reports no additional hemorrhoidal bleeding over the past month, in spite of intermittent mild constipation. She denies any symptoms of nosebleeds, blood in her urine or gastritis, and there has been no documentation of other gastrointestinal bleeding or intracranial hemorrhage.  She denies taking any aspirin or medications containing NSAIDs, and states that she knows to take only Tylenol for mild pain. Two new bottles of study medication (aspirin '100mg'$ /placebo x 90 tablets per bottle) were dispensed by pharmacist, Emeline Darling and were given to the patient for continued dosing through next study visit in October. Of note, research nurse has requested guidance from Protocol Manager, Virginia Crews, regarding subsequent visits, following today's visit occurring within the -2 day-window. In addition to the partially completed April medication log, the patient was given blank copies of medication logs for May through October at the reduced dose of aspirin '100mg'$ /placebo. [name] BSN, RN, CCRP 07/07/2016 1:47 PM  Adverse Event Log - AFT-05 PALLAS Cycles 12-15 and Alliance [id number] Month 12 04/07/2016 - 07/07/2016 Baseline AEs (grade): dizziness,  int. (1); insomnia (1); anxiety (1); hiccups, int. (1); lower extremity pain, int. (2) Event Grade Onset Date Resolved Date Attribution to anastrozole Attribution to aspirin/placebo Treatment Comments  Memory impairment 2 01/09/2016 06/06/2016 No Unrelated Aricept Improved to mild in nature approximately one month ago.  Memory  impairment 1 06/06/2016 ongoing No Unrelated Aricept   Musculoskeletal aches 1 01/09/2016 ongoing Yes Not reportable None Intermittent in nature  Bruising 2 03/20/2016 04/07/2016 No Probable None   Bruising 1 04/07/2016 ongoing No Probable None   Hypertension 2 10/17/2015 07/07/2016 No Unrelated None   Hypertension 1 07/07/2016 ongoing No Unrelated None   Surgical site pain, int. 1 01/09/2016 ongoing No Not reportable Tylenol   Weight gain 1 12/20/2015 ongoing Yes Not reportable None    Osteopenia 1 07/26/2015 Ongoing Yes Not reportable None    Heartburn, int. 1 12/09/2015 Ongoing Yes Possible None   Constipation, int. 1 12/2015 Ongoing Yes Not reportable None   Hemorrhoids 1 12/2015 Ongoing No Not reportable None   Hemorrhoidal bleeding, int. 1 12/2015 05/08/2016 No Definite None   Vaginal bleeding, int. 1 03/2016 05/08/2016 Yes Unlikely None   Hematoma, abd. wall 1 03/20/2016 Ongoing No Unlikely None   Elevated cholesterol 1 04/07/2016 Ongoing Yes Not reportable None   Elevated triglycerides 1 04/07/2016 Ongoing Yes Not reportable None   Sinusitis 2 04/28/2016 2/29/2018 No Unrelated amoxicillin   Bronchitis 2 04/28/2016 2/29/2018 No Unrelated Mucinex   Cough 2 04/28/2016 2/29/2018 No Unrelated Tessalon   Sore Throat 1 05/16/2016 05/26/2016 No Not reportable amoxicillin   Arm swelling, intermittent 1 06/08/2016 ongoing No Not reportable Lasix   [name] BSN, RN, [place] 08/22/2016 9:07 AM

## 2016-07-07 NOTE — Assessment & Plan Note (Signed)
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 Adjuvant chemotherapy with dose dense Adriamycin and Cytoxan x 4 followed by Abraxane weekly 12 because of lymph node positive disease. Started 09/04/14 to 01/15/15 2. adjuvant radiation completed 03/20/15 3. Followed by antiestrogen therapy started 04/03/15 -------------------------------------------------------------------------------------------------------------------------- Anastrozole toxicities: Patient denies any hot flashes. 1. Complains of occasional myalgias 2. Weight gain:  based on the BMI she is overweight.  Bone density 07/26/2015: T score -1.9, osteopenia  PALLAS Trial : Patient has been randomized to anastrozole alone.  ABC clinical trial: The trial randomizes between aspirin versus placebo. She went on trial 07/26/2015   Adverse effects: 1. Bruising/ Hematoma: Resolved after dose reduction. She is currently going to be on 100 mg of aspirin/placebo 2. bleeding from mucous membranes especially GI, vaginal, nose, gums, nailbeds: Resolved  Cognitive dysfunction: OnAricept 10 mg daily. Memory function has improved although she feels that short-term memory could be better  Weight gain:On Weight Watchers.I instructed the patient to start exercising as well. Parotid nodules: Biopsy-proven to be lymphoid infiltrates. No malignant cells seen  Breast cancer surveillance: 1. Mammogram done 07/26/2015 is normal. 2. Breast exam  04/07/2016: No palpable lumps or nodules of concern.

## 2016-07-07 NOTE — Telephone Encounter (Signed)
She went to DR Dodina - good report He wants her on hyperlipidemia med.  She is willing - can you review last numbers and call in med

## 2016-07-07 NOTE — Progress Notes (Signed)
Patient Care Team: Chipper Herb, MD as PCP - General (Family Medicine) Benson Norway, RN as Registered Nurse (Oncology) Nicholas Lose, MD as Consulting Physician (Hematology and Oncology) Coralie Keens, MD as Consulting Physician (General Surgery) Kyung Rudd, MD as Consulting Physician (Radiation Oncology) Sylvan Cheese, NP as Nurse Practitioner (Hematology and Oncology)  DIAGNOSIS:  Encounter Diagnosis  Name Primary?  . Malignant neoplasm of upper-outer quadrant of left breast in female, estrogen receptor positive ([place])     SUMMARY OF ONCOLOGIC HISTORY:   Malignant neoplasm of upper-outer quadrant of left breast in female, estrogen receptor positive ([place])   07/18/2014 Mammogram    Distortion left breast, breast density category C; U/S 1.3 x 0.8 x 0.8 cm left breast mass at 2:00 position 4 cm from nipple, no lymph nodes      07/20/2014 Initial Diagnosis    Left breast Biopsy: Invasive lobular cancer with LCIS, Grade 1, ER 86%, PR 78%, Her 2 Neg Ratio 1.77, Ki 67: 14%      08/01/2014 Breast MRI    Breast MRI showed non-mass enhancement 3.9 cm, no lymph nodes      08/01/2014 Clinical Stage    Stage IIA: T2 N0      08/08/2014 Surgery    Left breast lumpectomy: 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, with LCIS, medial and inferior margins positive, 3/4 lymph nodes positive      08/08/2014 Pathologic Stage    Stage IIB: T2 N1c M0      08/16/2014 Surgery    Left breast medial margin reexcision residual ILC 0.2 cm; inferior margin residual foci less than 0.2 cm, 1/5 lymph nodes positive, 1 lymph node with isolated tumor cells (Overall 4/10)      09/04/2014 - 01/15/2015 Chemotherapy    Adjuvant chemotherapy with dose dense Adriamycin and Cytoxan 4 followed by Abraxane weekly 8 ( stopped early for profound neutropenia and thrombocytopenia)      01/29/2015 - 03/20/2015 Radiation Therapy    Adjuvant XRT [facility]): 50.4 Gy over 28 fractions; seroma boost: 10 Gy over 5  fractions. Total dose: 60.4 Gy      04/11/2015 -  Anti-estrogen oral therapy    Anastrozole 1 mg daily (PALLAS clinical trial 05/24/2015 patient was randomized to hormone therapy alone)      05/24/2015 Survivorship    Survivorship visit completed and copy of care plan given to patient       CHIEF COMPLIANT: Follow-up of breast cancer as well as PALLAS and ABC trials  INTERVAL HISTORY: [name] is a 56 year old with above-mentioned history of left breast cancer currently on anastrozole and tolerating it fairly well. She is also on to clinical trials. She reports that the bruising related to aspirin study has improved significantly. She struggles with weight issues constantly. She has been eating very healthy and exercises regularly but has not lost significant weight. Her memory issues have improved markedly with Aricept. She is complaining of left arm swelling intermittently.  REVIEW OF SYSTEMS:   Constitutional: Denies fevers, chills or abnormal weight loss Eyes: Denies blurriness of vision Ears, nose, mouth, throat, and face: Denies mucositis or sore throat Respiratory: Denies cough, dyspnea or wheezes Cardiovascular: Denies palpitation, chest discomfort Gastrointestinal:  Denies nausea, heartburn or change in bowel habits Skin: Denies abnormal skin rashes Lymphatics: Denies new lymphadenopathy or easy bruising Neurological:Denies numbness, tingling or new weaknesses Behavioral/Psych: Mood is stable, no new changes  Extremities: Left arm mild swelling Breast:  denies any pain or lumps  or nodules in either breasts All other systems were reviewed with the patient and are negative.  I have reviewed the past medical history, past surgical history, social history and family history with the patient and they are unchanged from previous note.  ALLERGIES:  is allergic to statins; crestor [rosuvastatin]; lipitor [atorvastatin]; and temazepam.  MEDICATIONS:  Current Outpatient  Prescriptions  Medication Sig Dispense Refill  . acetaminophen (TYLENOL) 325 MG tablet Take 975 mg by mouth daily as needed for moderate pain. Usually takes twice daily in morning and bedtime and additionally during the day if needed for pain    . amoxicillin (AMOXIL) 500 MG capsule Take 1 capsule (500 mg total) by mouth 2 (two) times daily. 20 capsule 0  . anastrozole (ARIMIDEX) 1 MG tablet Take 1 tablet (1 mg total) by mouth daily. 90 tablet 3  . Biotin 1000 MCG tablet Take 1,000 mcg by mouth daily.    . clonazePAM (KLONOPIN) 0.5 MG tablet Take 1 tablet (0.5 mg total) by mouth 3 (three) times daily as needed. 90 tablet 5  . donepezil (ARICEPT) 10 MG tablet Take 1 tablet (10 mg total) by mouth at bedtime. 90 tablet 3  . Investigational aspirin/placebo 100 MG tablet Alliance [id number] Take 1 tablet by mouth daily. Take with food or a full glass of water. Do not crush enteric coated tablets. 180 tablet 0  . PARoxetine (PAXIL-CR) 25 MG 24 hr tablet Take 1 tablet (25 mg total) by mouth daily. 90 tablet 3  . Turmeric 500 MG TABS Take 1 tablet by mouth daily.    . Vitamin D, Ergocalciferol, (DRISDOL) 50000 units CAPS capsule TAKE 1 CAPSULE (50,000 UNITS TOTAL) BY MOUTH EVERY 7 (SEVEN) DAYS. 12 capsule 3   No current facility-administered medications for this visit.     PHYSICAL EXAMINATION: ECOG PERFORMANCE STATUS: 1 - Symptomatic but completely ambulatory  Vitals:   07/07/16 0817  BP: 128/69  Pulse: 74  Resp: 18  Temp: 98.1 F (36.7 C)   Filed Weights   07/07/16 0817  Weight: 160 lb 14.4 oz (73 kg)    GENERAL:alert, no distress and comfortable SKIN: skin color, texture, turgor are normal, no rashes or significant lesions EYES: normal, Conjunctiva are pink and non-injected, sclera clear OROPHARYNX:no exudate, no erythema and lips, buccal mucosa, and tongue normal  NECK: supple, thyroid normal size, non-tender, without nodularity LYMPH:  no palpable lymphadenopathy in the cervical,  axillary or inguinal LUNGS: clear to auscultation and percussion with normal breathing effort HEART: regular rate & rhythm and no murmurs and no lower extremity edema ABDOMEN:abdomen soft, non-tender and normal bowel sounds MUSCULOSKELETAL:no cyanosis of digits and no clubbing  NEURO: alert & oriented x 3 with fluent speech, no focal motor/sensory deficits EXTREMITIES:Left arm swelling from mild lymphedema   LABORATORY DATA:  I have reviewed the data as listed   Chemistry      Component Value Date/Time   NA 141 04/07/2016 0813   K 3.7 04/07/2016 0813   CL 99 (L) 09/09/2014 2336   CO2 26 04/07/2016 0813   BUN 8.1 04/07/2016 0813   CREATININE 0.6 04/07/2016 0813      Component Value Date/Time   CALCIUM 9.2 04/07/2016 0813   ALKPHOS 80 04/07/2016 0813   AST 17 04/07/2016 0813   ALT 20 04/07/2016 0813   BILITOT 0.50 04/07/2016 0813       Lab Results  Component Value Date   WBC 4.9 07/07/2016   HGB 13.7 07/07/2016   HCT 40.6 07/07/2016  MCV 96.9 07/07/2016   PLT 173 07/07/2016   NEUTROABS 2.2 07/07/2016    ASSESSMENT & PLAN:  Malignant neoplasm of upper-outer quadrant of left breast in female, estrogen receptor positive ([place]) Left breast lumpectomy 08/08/2014: 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 Adjuvant chemotherapy with dose dense Adriamycin and Cytoxan x 4 followed by Abraxane weekly 12 because of lymph node positive disease. Started 09/04/14 to 01/15/15 2. adjuvant radiation completed 03/20/15 3. Followed by antiestrogen therapy started  04/03/15 -------------------------------------------------------------------------------------------------------------------------- Anastrozole toxicities: Patient denies any hot flashes. 1. Complains of occasional myalgias 2. Weight gain:  based on the BMI she is overweight.  Bone density 07/26/2015: T score -1.9, osteopenia  PALLAS Trial : Patient has been randomized to anastrozole alone. ABC clinical trial: The trial randomizes between aspirin versus placebo. She went on trial 07/26/2015 Currently on 100 mg of dosage. Bruising has become markedly reduced.  Adverse effects: 1. Bruising/ Hematoma: Resolved after dose reduction. She is currently going to be on 100 mg of aspirin/placebo 2. bleeding from mucous membranes especially GI, vaginal, nose, gums, nailbeds: Resolved  Cognitive dysfunction: OnAricept 10 mg daily. Memory function has improved  significantly  Weight gain:On Weight Watchers.She walks 2 miles every day but is frustrated that she is not losing weight. Parotid nodules: Biopsy-proven to be lymphoid infiltrates. No malignant cells seen Mild lymphedema: I give her a prescription for lymphedema sleeve. I also gave her Lasix to be used as needed. We discussed about risks of low potassium on day 6 if she taken too regularly.  Breast cancer surveillance: 1. Mammogram done 07/26/2015 is normal. 2. Breast exam  04/07/2016: No palpable lumps or nodules of concern. Return to clinic in July for follow-up  I spent 25 minutes talking to the patient of which more than half was spent in counseling and coordination of care.  No orders of the defined types were placed in this encounter.  The patient has a good understanding of the overall plan. she agrees with it. she will call with any problems that may develop before the next visit here.   Rulon Eisenmenger, MD 07/07/16

## 2016-07-08 MED ORDER — ROSUVASTATIN CALCIUM 10 MG PO TABS: 10.0000 mg | ORAL_TABLET | Freq: Every day | ORAL | 3 refills | Status: DC

## 2016-07-08 NOTE — Telephone Encounter (Signed)
Start Crestor 10 mg 1 daily at bedtime. Recheck liver function test with lipid panel in 4 weeks fasting

## 2016-07-08 NOTE — Telephone Encounter (Signed)
spoke with pt - she is willing to re-try the crestor  - sent to pharm per [facility] note

## 2016-07-23 ENCOUNTER — Encounter: Payer: Self-pay | Admitting: Hematology and Oncology

## 2016-07-29 ENCOUNTER — Telehealth: Payer: Self-pay | Admitting: Hematology and Oncology

## 2016-07-29 NOTE — Telephone Encounter (Signed)
Faxed records to [facility]

## 2016-08-25 NOTE — Progress Notes (Signed)
08/25/2016 Typographical error in table below - symptoms of sinusitis, bronchitis and cough resolved 05/07/2016. [name] BSN, RN, CCRP 08/25/2016 4:21 PM

## 2016-08-26 ENCOUNTER — Other Ambulatory Visit: Payer: Self-pay | Admitting: *Deleted

## 2016-08-26 DIAGNOSIS — Z170 Estrogen receptor positive status [ER+]: Principal | ICD-10-CM

## 2016-08-26 DIAGNOSIS — C50412 Malignant neoplasm of upper-outer quadrant of left female breast: Secondary | ICD-10-CM

## 2016-09-22 ENCOUNTER — Encounter: Payer: PRIVATE HEALTH INSURANCE | Admitting: *Deleted

## 2016-09-22 ENCOUNTER — Ambulatory Visit (HOSPITAL_BASED_OUTPATIENT_CLINIC_OR_DEPARTMENT_OTHER): Payer: PRIVATE HEALTH INSURANCE | Admitting: Hematology and Oncology

## 2016-09-22 ENCOUNTER — Telehealth: Payer: Self-pay

## 2016-09-22 ENCOUNTER — Other Ambulatory Visit (HOSPITAL_BASED_OUTPATIENT_CLINIC_OR_DEPARTMENT_OTHER): Payer: PRIVATE HEALTH INSURANCE

## 2016-09-22 ENCOUNTER — Encounter: Payer: Self-pay | Admitting: Hematology and Oncology

## 2016-09-22 DIAGNOSIS — Z9221 Personal history of antineoplastic chemotherapy: Secondary | ICD-10-CM

## 2016-09-22 DIAGNOSIS — Z170 Estrogen receptor positive status [ER+]: Principal | ICD-10-CM

## 2016-09-22 DIAGNOSIS — Z006 Encounter for examination for normal comparison and control in clinical research program: Secondary | ICD-10-CM | POA: Diagnosis not present

## 2016-09-22 DIAGNOSIS — Z79811 Long term (current) use of aromatase inhibitors: Secondary | ICD-10-CM | POA: Diagnosis not present

## 2016-09-22 DIAGNOSIS — C50412 Malignant neoplasm of upper-outer quadrant of left female breast: Secondary | ICD-10-CM | POA: Diagnosis not present

## 2016-09-22 DIAGNOSIS — Z923 Personal history of irradiation: Secondary | ICD-10-CM | POA: Diagnosis not present

## 2016-09-22 DIAGNOSIS — G4700 Insomnia, unspecified: Secondary | ICD-10-CM | POA: Insufficient documentation

## 2016-09-22 LAB — CBC WITH DIFFERENTIAL/PLATELET
BASO%: 0.2 % (ref 0.0–2.0)
BASOS ABS: 0 10*3/uL (ref 0.0–0.1)
EOS ABS: 0.1 10*3/uL (ref 0.0–0.5)
EOS%: 1 % (ref 0.0–7.0)
HEMATOCRIT: 40.4 % (ref 34.8–46.6)
HEMOGLOBIN: 13.7 g/dL (ref 11.6–15.9)
LYMPH%: 45 % (ref 14.0–49.7)
MCH: 32.9 pg (ref 25.1–34.0)
MCHC: 33.9 g/dL (ref 31.5–36.0)
MCV: 96.9 fL (ref 79.5–101.0)
MONO#: 0.4 10*3/uL (ref 0.1–0.9)
MONO%: 7.7 % (ref 0.0–14.0)
NEUT#: 2.4 10*3/uL (ref 1.5–6.5)
NEUT%: 46.1 % (ref 38.4–76.8)
Platelets: 137 10*3/uL — ABNORMAL LOW (ref 145–400)
RBC: 4.17 10*6/uL (ref 3.70–5.45)
RDW: 12.9 % (ref 11.2–14.5)
WBC: 5.2 10*3/uL (ref 3.9–10.3)
lymph#: 2.4 10*3/uL (ref 0.9–3.3)

## 2016-09-22 LAB — COMPREHENSIVE METABOLIC PANEL
ALBUMIN: 4.2 g/dL (ref 3.5–5.0)
ALK PHOS: 80 U/L (ref 40–150)
ALT: 19 U/L (ref 0–55)
AST: 19 U/L (ref 5–34)
Anion Gap: 8 mEq/L (ref 3–11)
BUN: 8.5 mg/dL (ref 7.0–26.0)
CALCIUM: 9.1 mg/dL (ref 8.4–10.4)
CHLORIDE: 108 meq/L (ref 98–109)
CO2: 25 mEq/L (ref 22–29)
Creatinine: 0.7 mg/dL (ref 0.6–1.1)
Glucose: 103 mg/dl (ref 70–140)
POTASSIUM: 3.7 meq/L (ref 3.5–5.1)
SODIUM: 141 meq/L (ref 136–145)
Total Bilirubin: 0.66 mg/dL (ref 0.20–1.20)
Total Protein: 7.1 g/dL (ref 6.4–8.3)

## 2016-09-22 MED ORDER — LORAZEPAM 1 MG PO TABS: 1.0000 mg | ORAL_TABLET | Freq: Every day | ORAL | 0 refills | Status: DC

## 2016-09-22 NOTE — Assessment & Plan Note (Signed)
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 Adjuvant chemotherapy with dose dense Adriamycin and Cytoxan x 4 followed by Abraxane weekly 12 because of lymph node positive disease. Started 09/04/14 to 01/15/15 2. adjuvant radiation completed 03/20/15 3. Followed by antiestrogen therapy started 04/03/15 -------------------------------------------------------------------------------------------------------------------------- Anastrozole toxicities: Patient denies any hot flashes. 1. Complains of occasional myalgias 2. Weight gain:  based on the BMI she is overweight.  Bone density 07/26/2015: T score -1.9, osteopenia  PALLAS Trial : Patient has been randomized to anastrozole alone. ABC clinical trial: The trial randomizes between aspirin versus placebo. She went on trial 07/26/2015 Currently on 100 mg of dosage. Bruising has become markedly reduced.  Adverse effects: 1. Bruising/ Hematoma: Resolved after dose reduction. She is currently going to be on 100 mg of aspirin/placebo 2. bleeding from mucous membranes especially GI, vaginal, nose, gums, nailbeds: Resolved  Cognitive dysfunction: OnAricept 10 mg daily. Memory function has improved significantly  Weight gain:On Weight Watchers.  Breast cancer surveillance: 1. Mammogram done 07/23/2016 showed asymmetry right breast which was evaluated by ultrasound which did not show any abnormality 2. Breast exam 09/22/2016: No palpable lumps or nodules of concern.  Return to clinic in 6 months for follow-up

## 2016-09-22 NOTE — Progress Notes (Signed)
Patient Care Team: Chipper Herb, MD as PCP - General (Family Medicine) Benson Norway, RN as Registered Nurse (Oncology) Nicholas Lose, MD as Consulting Physician (Hematology and Oncology) Coralie Keens, MD as Consulting Physician (General Surgery) Kyung Rudd, MD as Consulting Physician (Radiation Oncology) Sylvan Cheese, NP as Nurse Practitioner (Hematology and Oncology)  DIAGNOSIS:  Encounter Diagnoses  Name Primary?  . Malignant neoplasm of upper-outer quadrant of left breast in female, estrogen receptor positive ([place])   . Insomnia, unspecified type     SUMMARY OF ONCOLOGIC HISTORY:   Malignant neoplasm of upper-outer quadrant of left breast in female, estrogen receptor positive ([place])   07/18/2014 Mammogram    Distortion left breast, breast density category C; U/S 1.3 x 0.8 x 0.8 cm left breast mass at 2:00 position 4 cm from nipple, no lymph nodes      07/20/2014 Initial Diagnosis    Left breast Biopsy: Invasive lobular cancer with LCIS, Grade 1, ER 86%, PR 78%, Her 2 Neg Ratio 1.77, Ki 67: 14%      08/01/2014 Breast MRI    Breast MRI showed non-mass enhancement 3.9 cm, no lymph nodes      08/01/2014 Clinical Stage    Stage IIA: T2 N0      08/08/2014 Surgery    Left breast lumpectomy: 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, with LCIS, medial and inferior margins positive, 3/4 lymph nodes positive      08/08/2014 Pathologic Stage    Stage IIB: T2 N1c M0      08/16/2014 Surgery    Left breast medial margin reexcision residual ILC 0.2 cm; inferior margin residual foci less than 0.2 cm, 1/5 lymph nodes positive, 1 lymph node with isolated tumor cells (Overall 4/10)      09/04/2014 - 01/15/2015 Chemotherapy    Adjuvant chemotherapy with dose dense Adriamycin and Cytoxan 4 followed by Abraxane weekly 8 ( stopped early for profound neutropenia and thrombocytopenia)      01/29/2015 - 03/20/2015 Radiation Therapy    Adjuvant XRT [facility]): 50.4 Gy over 28  fractions; seroma boost: 10 Gy over 5 fractions. Total dose: 60.4 Gy      04/11/2015 -  Anti-estrogen oral therapy    Anastrozole 1 mg daily (PALLAS clinical trial 05/24/2015 patient was randomized to hormone therapy alone)      05/24/2015 Survivorship    Survivorship visit completed and copy of care plan given to patient       CHIEF COMPLIANT: Follow-up on anastrozole therapy, complaining of inability to lose weight as well as lack of sleep  INTERVAL HISTORY: [name] is a 56 year old with above-mentioned history of left breast cancer who is currently on anastrozole therapy. She is tolerating the treatment fairly well. Her biggest complaint is inability to lose weight in spite of exercising regularly and eating very healthy diet. Her weight has remained the same. She is quite disappointed about that. She is also having difficulty with sleep. She sleeps 2-3 hours every night. This makes her tired through the day she is a very busy lady working hard with her current job.   REVIEW OF SYSTEMS:   Constitutional: Denies fevers, chills or abnormal weight loss Eyes: Denies blurriness of vision Ears, nose, mouth, throat, and face: Denies mucositis or sore throat Respiratory: Denies cough, dyspnea or wheezes Cardiovascular: Denies palpitation, chest discomfort Gastrointestinal:  Denies nausea, heartburn or change in bowel habits Skin: Denies abnormal skin rashes Lymphatics: Denies new lymphadenopathy or easy bruising Neurological:Denies numbness, tingling  or new weaknesses Behavioral/Psych: Mood is stable, no new changes  Extremities: No lower extremity edema Breast:  denies any pain or lumps or nodules in either breasts All other systems were reviewed with the patient and are negative.  I have reviewed the past medical history, past surgical history, social history and family history with the patient and they are unchanged from previous note.  ALLERGIES:  is allergic to statins;  crestor [rosuvastatin]; lipitor [atorvastatin]; and temazepam.  MEDICATIONS:  Current Outpatient Prescriptions  Medication Sig Dispense Refill  . acetaminophen (TYLENOL) 325 MG tablet Take 975 mg by mouth daily as needed for moderate pain. Usually takes twice daily in morning and bedtime and additionally during the day if needed for pain    . anastrozole (ARIMIDEX) 1 MG tablet Take 1 tablet (1 mg total) by mouth daily. 90 tablet 3  . Biotin 1000 MCG tablet Take 1,000 mcg by mouth daily.    . clonazePAM (KLONOPIN) 0.5 MG tablet Take 1 tablet (0.5 mg total) by mouth 3 (three) times daily as needed. 90 tablet 5  . donepezil (ARICEPT) 10 MG tablet Take 1 tablet (10 mg total) by mouth at bedtime. 90 tablet 3  . furosemide (LASIX) 20 MG tablet Take 1 tablet (20 mg total) by mouth daily. 30 tablet 3  . Investigational aspirin/placebo 100 MG tablet Alliance [id number] Take 1 tablet by mouth daily. Take with food or a full glass of water. Do not crush enteric coated tablets. 180 tablet 0  . LORazepam (ATIVAN) 1 MG tablet Take 1 tablet (1 mg total) by mouth at bedtime. 30 tablet 0  . PARoxetine (PAXIL-CR) 25 MG 24 hr tablet Take 1 tablet (25 mg total) by mouth daily. 90 tablet 3  . rosuvastatin (CRESTOR) 10 MG tablet Take 1 tablet (10 mg total) by mouth daily. 90 tablet 3  . Turmeric 500 MG TABS Take 1 tablet by mouth daily.    . Vitamin D, Ergocalciferol, (DRISDOL) 50000 units CAPS capsule TAKE 1 CAPSULE (50,000 UNITS TOTAL) BY MOUTH EVERY 7 (SEVEN) DAYS. 12 capsule 3   No current facility-administered medications for this visit.     PHYSICAL EXAMINATION: ECOG PERFORMANCE STATUS: 1 - Symptomatic but completely ambulatory  Vitals:   09/22/16 0845  BP: 122/74  Pulse: 68  Resp: 16  Temp: 98.6 F (37 C)   Filed Weights   09/22/16 0845  Weight: 160 lb 12.8 oz (72.9 kg)    GENERAL:alert, no distress and comfortable SKIN: skin color, texture, turgor are normal, no rashes or significant  lesions EYES: normal, Conjunctiva are pink and non-injected, sclera clear OROPHARYNX:no exudate, no erythema and lips, buccal mucosa, and tongue normal  NECK: supple, thyroid normal size, non-tender, without nodularity LYMPH:  no palpable lymphadenopathy in the cervical, axillary or inguinal LUNGS: clear to auscultation and percussion with normal breathing effort HEART: regular rate & rhythm and no murmurs and no lower extremity edema ABDOMEN:abdomen soft, non-tender and normal bowel sounds MUSCULOSKELETAL:no cyanosis of digits and no clubbing  NEURO: alert & oriented x 3 with fluent speech, no focal motor/sensory deficits EXTREMITIES: No lower extremity edema LABORATORY DATA:  I have reviewed the data as listed   Chemistry      Component Value Date/Time   NA 141 09/22/2016 0832   K 3.7 09/22/2016 0832   CL 99 (L) 09/09/2014 2336   CO2 25 09/22/2016 0832   BUN 8.5 09/22/2016 0832   CREATININE 0.7 09/22/2016 0832      Component Value Date/Time  CALCIUM 9.1 09/22/2016 0832   ALKPHOS 80 09/22/2016 0832   AST 19 09/22/2016 0832   ALT 19 09/22/2016 0832   BILITOT 0.66 09/22/2016 0832       Lab Results  Component Value Date   WBC 5.2 09/22/2016   HGB 13.7 09/22/2016   HCT 40.4 09/22/2016   MCV 96.9 09/22/2016   PLT 137 (L) 09/22/2016   NEUTROABS 2.4 09/22/2016    ASSESSMENT & PLAN:  Malignant neoplasm of upper-outer quadrant of left breast in female, estrogen receptor positive ([place]) Left breast lumpectomy 08/08/2014: 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 Adjuvant chemotherapy with dose dense Adriamycin  and Cytoxan x 4 followed by Abraxane weekly 12 because of lymph node positive disease. Started 09/04/14 to 01/15/15 2. adjuvant radiation completed 03/20/15 3. Followed by antiestrogen therapy started 04/03/15 -------------------------------------------------------------------------------------------------------------------------- Anastrozole toxicities: Patient denies any hot flashes. 1. Complains of occasional myalgias 2. Weight gain:  based on the BMI she is overweight. Her weight has remained stable.   Bone density 07/26/2015: T score -1.9, osteopenia  PALLAS Trial : Patient has been randomized to anastrozole alone. ABC clinical trial: The trial randomizes between aspirin versus placebo. She went on trial 07/26/2015 Currently on 100 mg of dosage. Bruising has become markedly reduced. Mild thrombocytopenia: This is not clinically significant.   Adverse effects: 1. Bruising/ Hematoma: markedly improved  after dose reduction. She is currently going to be on 100 mg of aspirin/placebo 2. bleeding from mucous membranes especially GI, vaginal, nose, gums, nailbeds: Resolved  Cognitive dysfunction: OnAricept 10 mg daily. Memory function has improved significantly  Weight gain:On Weight Watchers.  Breast cancer surveillance: 1. Mammogram done 07/23/2016 showed asymmetry right breast which was evaluated by ultrasound which did not show any abnormality 2. Breast exam 09/22/2016: No palpable lumps or nodules of concern.  Return to clinic in Oct for follow-up  I spent 25 minutes talking to the patient of which more than half was spent in counseling and coordination of care.  No orders of the defined types were placed in this encounter.  The patient has a good understanding of the overall plan. she agrees with it. she will call with any problems that may develop before the next visit here.   Rulon Eisenmenger, MD 09/22/16

## 2016-09-22 NOTE — Progress Notes (Signed)
09/22/2016 Patient in to clinic unaccompanied today for AFT-05 PALLAS Visit #10. Upon arrival to the clinic, PRO and Adherence Questionnaires were completed independently by the patient. Patient returned her completed anti-hormone therapy drug diary for Cycles 15 through 17. Patient was given drug diaries for Cycles 18, 19 and 20 of anti-hormone therapy, beginning today. Instructed patient to return the completed diaries at the next study visit, to be scheduled for 12/19/2016, within +7 days of Cycle 21, Day 1, to coincide with Alliance [id number] ABC Month 18 visit. Patient and MD are in agreement with this plan. See Adverse Event Log table below regarding AEs. [name] BSN, RN, [place] 09/22/2016 2:22 PM  Adverse Event Log - AFT-05 PALLAS Cycles 15-17 07/07/2016 - 09/22/2016 Baseline AEs (grade): dizziness, int. (1); insomnia (1); anxiety (1); hiccups, int. (1); lower extremity pain, int. (2) Event Grade Onset Date Resolved Date Attribution to anastrozole Treatment Comments  Memory impairment 1 06/06/2016 ongoing No Aricept Much improved but still has mild cognitive difficulty.  Musculoskeletal aches 1 01/09/2016 ongoing Yes None Intermittent in nature  Bruising 1 04/07/2016 ongoing No None   Hypertension 1 07/07/2016 ongoing No None   Surgical site pain, int. 1 01/09/2016 ongoing No Tylenol   Weight gain 1 12/20/2015 ongoing Yes None    Osteopenia 1 07/26/2015 Ongoing Yes None    Heartburn, int. 1 12/09/2015 07/22/2016 Yes None Estimated stop date  Constipation, int. 1 12/2015 Ongoing Yes None   Hemorrhoids 1 12/2015 Ongoing No None   Hematoma, abd. wall 1 03/20/2016 Ongoing No None   Elevated cholesterol 1 04/07/2016 Ongoing Yes None   Elevated triglycerides 1 04/07/2016 Ongoing Yes None   Arm swelling, intermittent 1 06/08/2016 ongoing No Lasix   Insomnia 2 09/07/2016 ongoing Yes Ativan Worsened from baseline, requiring medication.  Decreased platelet count (grade 1) on 7/16/208 not clinically  significant. [name] BSN, RN, CCRP 10/28/2016 10:14 AM

## 2016-09-23 LAB — HEMOGLOBIN A1C
Est. average glucose Bld gHb Est-mCnc: 97 mg/dL
Hemoglobin A1c: 5 % (ref 4.8–5.6)

## 2016-09-23 NOTE — Progress Notes (Signed)
09/23/2015 Patient returned the completed April 2018 Aspirin/Placebo Medication Log. [name] BSN, RN, CCRP 09/23/2016 8:58 AM

## 2016-10-08 ENCOUNTER — Encounter: Payer: Self-pay | Admitting: Family Medicine

## 2016-10-08 ENCOUNTER — Ambulatory Visit (INDEPENDENT_AMBULATORY_CARE_PROVIDER_SITE_OTHER): Payer: PRIVATE HEALTH INSURANCE | Admitting: Family Medicine

## 2016-10-08 VITALS — BP 106/70 | HR 72 | Temp 98.0°F | Ht 65.0 in | Wt 159.0 lb

## 2016-10-08 DIAGNOSIS — E559 Vitamin D deficiency, unspecified: Secondary | ICD-10-CM | POA: Diagnosis not present

## 2016-10-08 DIAGNOSIS — C50412 Malignant neoplasm of upper-outer quadrant of left female breast: Secondary | ICD-10-CM | POA: Diagnosis not present

## 2016-10-08 DIAGNOSIS — Z170 Estrogen receptor positive status [ER+]: Secondary | ICD-10-CM

## 2016-10-08 DIAGNOSIS — E7800 Pure hypercholesterolemia, unspecified: Secondary | ICD-10-CM | POA: Diagnosis not present

## 2016-10-08 DIAGNOSIS — F418 Other specified anxiety disorders: Secondary | ICD-10-CM

## 2016-10-08 DIAGNOSIS — H8109 Meniere's disease, unspecified ear: Secondary | ICD-10-CM | POA: Diagnosis not present

## 2016-10-08 MED ORDER — PAROXETINE HCL ER 25 MG PO TB24: 25.0000 mg | ORAL_TABLET | Freq: Every day | ORAL | 3 refills | Status: DC

## 2016-10-08 MED ORDER — CLONAZEPAM 0.5 MG PO TABS: 0.5000 mg | ORAL_TABLET | Freq: Three times a day (TID) | ORAL | 5 refills | Status: DC | PRN

## 2016-10-08 MED ORDER — ROSUVASTATIN CALCIUM 10 MG PO TABS: 10.0000 mg | ORAL_TABLET | Freq: Every day | ORAL | 3 refills | Status: DC

## 2016-10-08 MED ORDER — VITAMIN D (ERGOCALCIFEROL) 1.25 MG (50000 UNIT) PO CAPS: ORAL_CAPSULE | ORAL | 3 refills | Status: DC

## 2016-10-08 NOTE — Progress Notes (Signed)
Subjective:    Patient ID: [name], female    DOB: Apr 14, 1960, 56 y.o.   MRN: [id number]  HPI Pt here for follow up and management of chronic medical problems which includes hyperlipidemia. She is taking medication regularly.The patient is doing well overall and in good spirits. She is followed regularly by her oncologist who is taking good care of her. She is on tamoxifen. She denies chest pain shortness of breath trouble swallowing heartburn indigestion nausea vomiting diarrhea or blood in the stool. She is passing her water without problems. She is up-to-date on her colonoscopies and there is no family history of colon cancer. Her next colonoscopy is due and 2024. She is concerned about her weight and she is walking and exercising regularly and trying to watch the sugar in her diet as much as possible. She has a body mass index of 26.8. She tries to drink a lot of water. She is taking her statin drug.She is walking and exercising daily and eating healthy.     Patient Active Problem List   Diagnosis Date Noted  . Insomnia 09/22/2016  . Bronchitis 05/26/2016  . Vitamin D deficiency 04/07/2016  . Easy bruising 03/20/2016  . 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 Anemia associated with chemotherapy 10/24/2014  . Malignant neoplasm of upper-outer quadrant of left breast in female, estrogen receptor positive ([facility]) 07/31/2014  . Hyperlipidemia 09/06/2012  . Generalized anxiety disorder 09/06/2012  . Meniere's disease 09/06/2012  . Statin intolerance 09/06/2012  . Left rotator cuff tear 09/06/2012   Outpatient Encounter Prescriptions as of 10/08/2016  Medication Sig  . acetaminophen (TYLENOL) 325 MG tablet Take 975 mg by mouth daily as needed for moderate pain. Usually takes twice daily in morning and bedtime and additionally during the day if needed for pain  . anastrozole (ARIMIDEX) 1 MG tablet Take 1 tablet (1 mg total) by mouth daily.  . Biotin 1000 MCG tablet Take 1,000 mcg by mouth  daily.  . clonazePAM (KLONOPIN) 0.5 MG tablet Take 1 tablet (0.5 mg total) by mouth 3 (three) times daily as needed.  . donepezil (ARICEPT) 10 MG tablet Take 1 tablet (10 mg total) by mouth at bedtime.  . furosemide (LASIX) 20 MG tablet Take 1 tablet (20 mg total) by mouth daily.  . Investigational aspirin/placebo 100 MG tablet [facility] [id number] Take 1 tablet by mouth daily. Take with food or a full glass of water. Do not crush enteric coated tablets.  Marland Kitchen LORazepam (ATIVAN) 1 MG tablet Take 1 tablet (1 mg total) by mouth at bedtime.  Marland Kitchen PARoxetine (PAXIL-CR) 25 MG 24 hr tablet Take 1 tablet (25 mg total) by mouth daily.  . rosuvastatin (CRESTOR) 10 MG tablet Take 1 tablet (10 mg total) by mouth daily.  . Turmeric 500 MG TABS Take 1 tablet by mouth daily.  . Vitamin D, Ergocalciferol, (DRISDOL) 50000 units CAPS capsule TAKE 1 CAPSULE (50,000 UNITS TOTAL) BY MOUTH EVERY 7 (SEVEN) DAYS.   No facility-administered encounter medications on file as of 10/08/2016.       Review of Systems  Constitutional: Negative.        Weight issues  HENT: Negative.   Eyes: Negative.   Respiratory: Negative.   Cardiovascular: Negative.   Gastrointestinal: Negative.   Endocrine: Negative.   Genitourinary: Negative.   Musculoskeletal: Negative.   Skin: Negative.   Allergic/Immunologic: Negative.   Neurological: Negative.   Hematological: Negative.   Psychiatric/Behavioral: Negative.        Objective:   Physical  Exam  Constitutional: She is oriented to person, place, and time. She appears well-developed and well-nourished.  The patient is positive and pleasant and doing wonderful with taking care of her breast cancer.  HENT:  Head: Normocephalic and atraumatic.  Right Ear: External ear normal.  Left Ear: External ear normal.  Nose: Nose normal.  Mouth/Throat: Oropharynx is clear and moist. No oropharyngeal exudate.  Eyes: Pupils are equal, round, and reactive to light. Conjunctivae and EOM are normal.  Right eye exhibits no discharge. Left eye exhibits no discharge. No scleral icterus.  Neck: Normal range of motion. Neck supple. No thyromegaly present.  No bruits thyromegaly or anterior cervical adenopathy  Cardiovascular: Normal rate, regular rhythm, normal heart sounds and intact distal pulses.   No murmur heard. The heart is 72/m with a regular rate and rhythm  Pulmonary/Chest: Effort normal and breath sounds normal. No respiratory distress. She has no wheezes. She has no rales. She exhibits no tenderness.  Clear anteriorly and posteriorly  Abdominal: Soft. Bowel sounds are normal. She exhibits no mass. There is no tenderness. There is no rebound and no guarding.  No liver or spleen enlargement no bruits and no inguinal adenopathy  Musculoskeletal: Normal range of motion. She exhibits no edema.  Lymphadenopathy:    She has no cervical adenopathy.  Neurological: She is alert and oriented to person, place, and time. She has normal reflexes. No cranial nerve deficit.  Skin: Skin is warm and dry. No rash noted.  Psychiatric: She has a normal mood and affect. Her behavior is normal. Judgment and thought content normal.  Nursing note and vitals reviewed.   BP 106/70 (BP Location: Left Arm)   Pulse 72   Temp 98 F (36.7 C) (Oral)   Ht 5\' 5"  (1.651 m)   Wt 159 lb (72.1 kg)   BMI 26.46 kg/m        Assessment & Plan:  1. Pure hypercholesterolemia -Continue with Crestor and aggressive therapeutic lifestyle changes pending results of lab work - Lipid panel  2. Vitamin D deficiency -Continue with vitamin D replacement pending results of lab work - VITAMIN D 25 Hydroxy (Vit-D Deficiency, Fractures)  3. Meniere's disease, unspecified laterality -Currently, this appears to be under good control with no complaints or symptoms regarding this.  4. Malignant neoplasm of upper-outer quadrant of left breast in female, estrogen receptor positive ([facility]) -Continue to follow-up with Dr. Sonny Dandy,  the oncologist  5. Anxiety with depression -Continue with clonazepam as currently doing and as needed  Meds ordered this encounter  Medications  . clonazePAM (KLONOPIN) 0.5 MG tablet    Sig: Take 1 tablet (0.5 mg total) by mouth 3 (three) times daily as needed.    Dispense:  90 tablet    Refill:  5    Not to exceed 4 additional fills before 05/31/2016.  . rosuvastatin (CRESTOR) 10 MG tablet    Sig: Take 1 tablet (10 mg total) by mouth daily.    Dispense:  90 tablet    Refill:  3  . Vitamin D, Ergocalciferol, (DRISDOL) 50000 units CAPS capsule    Sig: TAKE 1 CAPSULE (50,000 UNITS TOTAL) BY MOUTH EVERY 7 (SEVEN) DAYS.    Dispense:  12 capsule    Refill:  3  . PARoxetine (PAXIL-CR) 25 MG 24 hr tablet    Sig: Take 1 tablet (25 mg total) by mouth daily.    Dispense:  90 tablet    Refill:  3   Patient Instructions  Continue current  medications. Continue good therapeutic lifestyle changes which include good diet and exercise. Fall precautions discussed with patient. If an FOBT was given today- please return it to our front desk. If you are over 75 years old - you may need Prevnar 50 or the adult Pneumonia vaccine.  **Flu shots are available--- please call and schedule a FLU-CLINIC appointment**  After your visit with [place] today you will receive a survey in the mail or online from [employer] regarding your care with [place]. Please take a moment to fill this out. Your feedback is very important to [place] as you can help [place] better understand your patient needs as well as improve your experience and satisfaction. WE CARE ABOUT YOU!!!   Continue to follow-up with oncology and gynecology and follow with their recommendations Continue with aggressive therapeutic lifestyle changes which include diet and exercise to get cholesterol under better control    Arrie Senate MD

## 2016-10-08 NOTE — Patient Instructions (Addendum)
Continue current medications. Continue good therapeutic lifestyle changes which include good diet and exercise. Fall precautions discussed with patient. If an FOBT was given today- please return it to our front desk. If you are over 54 years old - you may need Prevnar 82 or the adult Pneumonia vaccine.  **Flu shots are available--- please call and schedule a FLU-CLINIC appointment**  After your visit with [place] today you will receive a survey in the mail or online from [employer] regarding your care with [place]. Please take a moment to fill this out. Your feedback is very important to [place] as you can help [place] better understand your patient needs as well as improve your experience and satisfaction. WE CARE ABOUT YOU!!!   Continue to follow-up with oncology and gynecology and follow with their recommendations Continue with aggressive therapeutic lifestyle changes which include diet and exercise to get cholesterol under better control

## 2016-10-09 LAB — LIPID PANEL
Chol/HDL Ratio: 2.6 ratio (ref 0.0–4.4)
Cholesterol, Total: 134 mg/dL (ref 100–199)
HDL: 51 mg/dL (ref >39)
LDL Calculated: 57 mg/dL (ref 0–99)
TRIGLYCERIDES: 130 mg/dL (ref 0–149)
VLDL CHOLESTEROL CAL: 26 mg/dL (ref 5–40)

## 2016-10-09 LAB — VITAMIN D 25 HYDROXY (VIT D DEFICIENCY, FRACTURES): VIT D 25 HYDROXY: 47.6 ng/mL (ref 30.0–100.0)

## 2016-10-10 ENCOUNTER — Other Ambulatory Visit: Payer: PRIVATE HEALTH INSURANCE

## 2016-10-10 DIAGNOSIS — Z1212 Encounter for screening for malignant neoplasm of rectum: Secondary | ICD-10-CM

## 2016-10-13 LAB — FECAL OCCULT BLOOD, IMMUNOCHEMICAL: FECAL OCCULT BLD: NEGATIVE

## 2016-10-29 ENCOUNTER — Other Ambulatory Visit: Payer: Self-pay | Admitting: Hematology and Oncology

## 2016-10-29 DIAGNOSIS — C50412 Malignant neoplasm of upper-outer quadrant of left female breast: Secondary | ICD-10-CM

## 2016-11-17 ENCOUNTER — Other Ambulatory Visit: Payer: Self-pay | Admitting: Hematology and Oncology

## 2016-11-17 ENCOUNTER — Telehealth: Payer: Self-pay

## 2016-11-17 ENCOUNTER — Other Ambulatory Visit: Payer: Self-pay

## 2016-11-17 DIAGNOSIS — Z170 Estrogen receptor positive status [ER+]: Principal | ICD-10-CM

## 2016-11-17 DIAGNOSIS — C50412 Malignant neoplasm of upper-outer quadrant of left female breast: Secondary | ICD-10-CM

## 2016-11-17 NOTE — Telephone Encounter (Signed)
error 

## 2016-11-17 NOTE — Telephone Encounter (Signed)
Called and spoke with pt regarding PET SCAN scheduled for 11/26/16 (wed) at [facility] 12pm. Arrive at 1130am with nothing to eat or drink 6 hrs prior. Pt verbalized understanding and confirmed appt.

## 2016-11-19 ENCOUNTER — Other Ambulatory Visit: Payer: Self-pay

## 2016-11-19 ENCOUNTER — Telehealth: Payer: Self-pay

## 2016-11-19 NOTE — Telephone Encounter (Signed)
Prescription for Lorazepam called in to pt's pharmacy per pt request

## 2016-11-26 ENCOUNTER — Ambulatory Visit (HOSPITAL_COMMUNITY)
Admission: RE | Admit: 2016-11-26 | Discharge: 2016-11-26 | Disposition: A | Discharge status: Home / Self Care | Payer: PRIVATE HEALTH INSURANCE | Source: Ambulatory Visit | Attending: Hematology and Oncology | Admitting: Hematology and Oncology

## 2016-11-26 DIAGNOSIS — R918 Other nonspecific abnormal finding of lung field: Secondary | ICD-10-CM | POA: Diagnosis not present

## 2016-11-26 DIAGNOSIS — C50412 Malignant neoplasm of upper-outer quadrant of left female breast: Secondary | ICD-10-CM | POA: Diagnosis present

## 2016-11-26 DIAGNOSIS — Z170 Estrogen receptor positive status [ER+]: Secondary | ICD-10-CM | POA: Insufficient documentation

## 2016-11-26 DIAGNOSIS — K8020 Calculus of gallbladder without cholecystitis without obstruction: Secondary | ICD-10-CM | POA: Insufficient documentation

## 2016-11-26 DIAGNOSIS — R0602 Shortness of breath: Secondary | ICD-10-CM | POA: Diagnosis present

## 2016-11-26 DIAGNOSIS — M899 Disorder of bone, unspecified: Secondary | ICD-10-CM | POA: Insufficient documentation

## 2016-11-26 DIAGNOSIS — R0789 Other chest pain: Secondary | ICD-10-CM | POA: Insufficient documentation

## 2016-11-26 DIAGNOSIS — R590 Localized enlarged lymph nodes: Secondary | ICD-10-CM | POA: Diagnosis not present

## 2016-11-26 LAB — GLUCOSE, CAPILLARY: Glucose-Capillary: 94 mg/dL (ref 65–99)

## 2016-11-26 MED ORDER — FLUDEOXYGLUCOSE F - 18 (FDG) INJECTION
8.3000 | Freq: Once | INTRAVENOUS | Status: AC | PRN
  Administered 2016-11-26: 8.3 via INTRAVENOUS

## 2016-12-01 ENCOUNTER — Telehealth: Payer: Self-pay | Admitting: Hematology and Oncology

## 2016-12-01 ENCOUNTER — Other Ambulatory Visit: Payer: Self-pay

## 2016-12-01 ENCOUNTER — Telehealth: Payer: Self-pay

## 2016-12-01 DIAGNOSIS — C50412 Malignant neoplasm of upper-outer quadrant of left female breast: Secondary | ICD-10-CM

## 2016-12-01 DIAGNOSIS — Z170 Estrogen receptor positive status [ER+]: Principal | ICD-10-CM

## 2016-12-01 NOTE — Telephone Encounter (Signed)
Sent referral to general surgery for cholelithiasis at [facility] Toney Rakes).  Phone: [phone].  [phone].  Pt referral scheduled for oct 12 at 10am. Confirmed that Sikes was accepted by this practice. Notified pt and called her insurance company to confirm as well. Pt appreciative of call.   Faxed recent MD notes and imaging that suggest cholelithiasis.

## 2016-12-01 NOTE — Telephone Encounter (Signed)
Faxed records to  Dr. Kathlene Cote [phone]

## 2016-12-09 IMAGING — MG MM DIAG BREAST TOMO UNI LEFT
6 series · 6 of 14 positions shown · non-contrast
Comparison: Prior exams

CLINICAL DATA: Screening callback for questioned left breast upper
outer quadrant distortion

EXAM:
DIGITAL DIAGNOSTIC left MAMMOGRAM WITH 3D TOMOSYNTHESIS WITH CAD
ULTRASOUND left BREAST

[L CC]
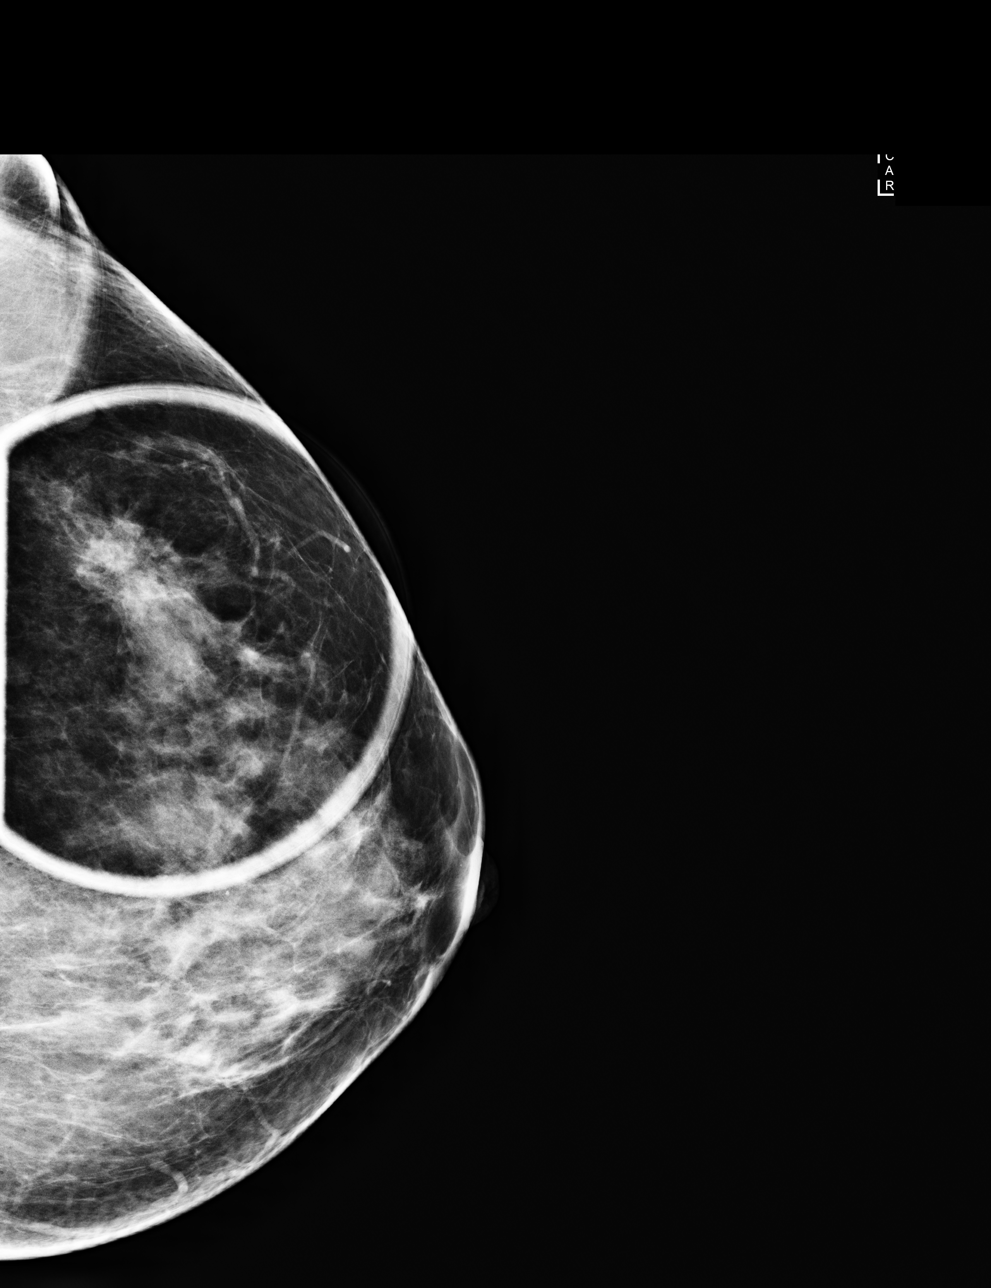

[L MLO]
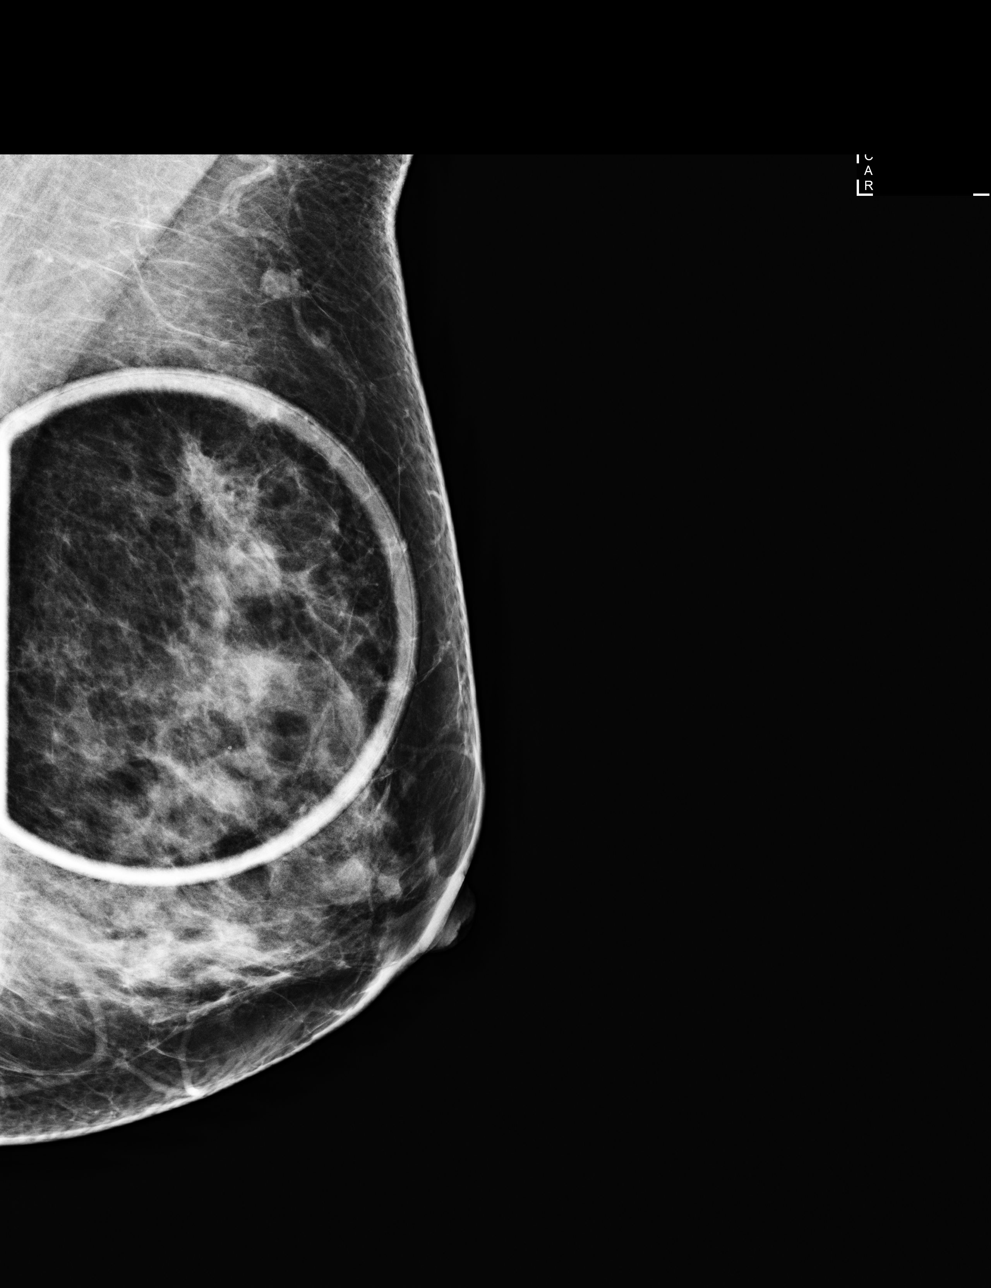

[L MLO tomo (1 of 2)]
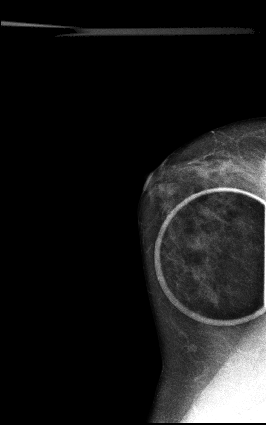

[L CC tomo (1 of 2)]
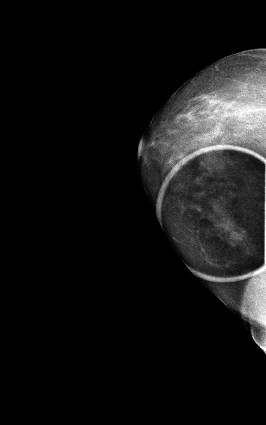

[L CC tomo (2 of 2) · tomo slice 27/54.0]
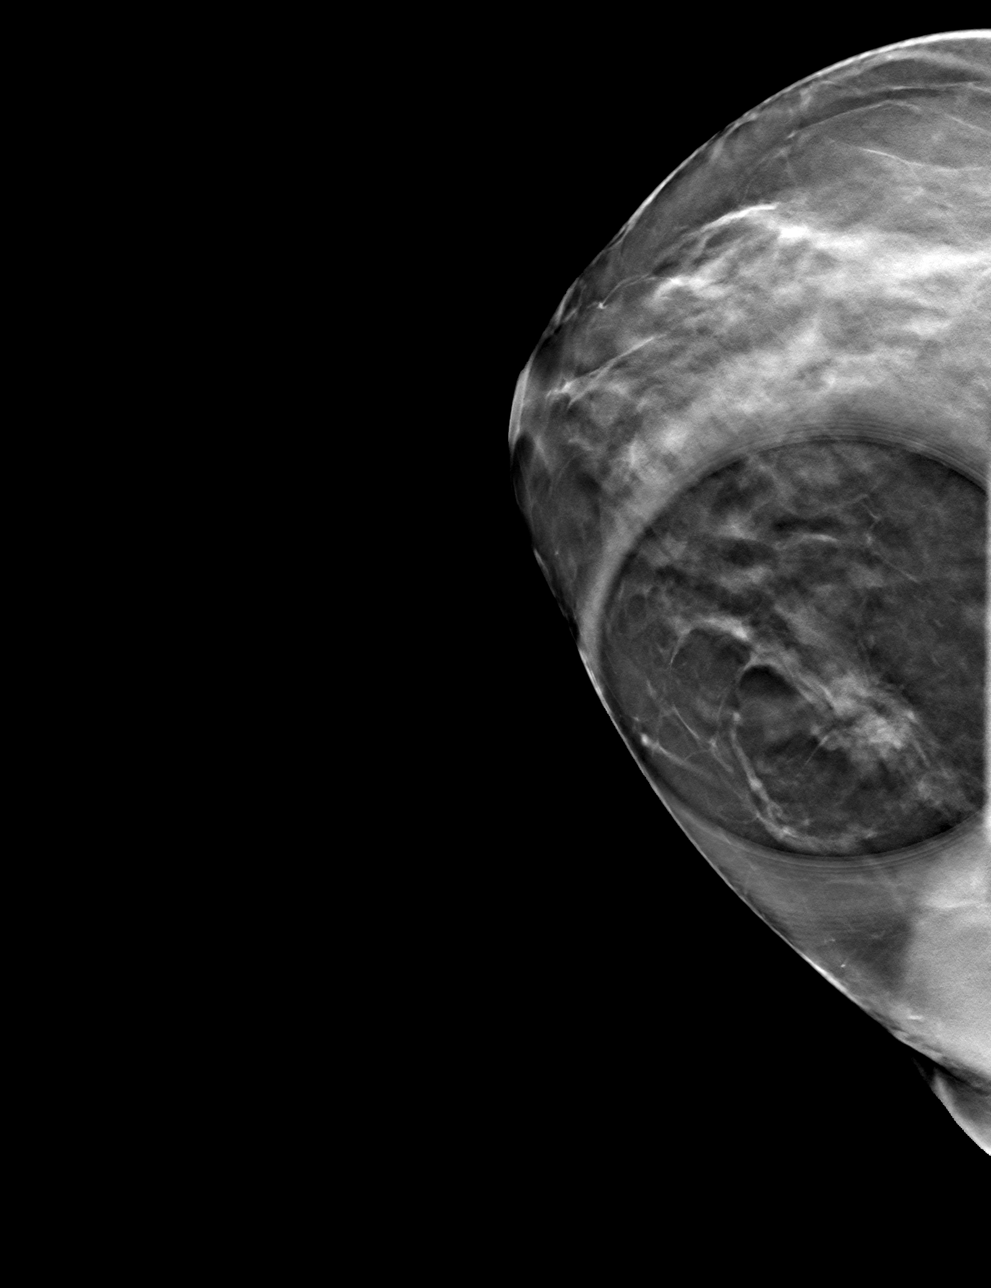

[L MLO tomo (2 of 2) · tomo slice 29/58.0]
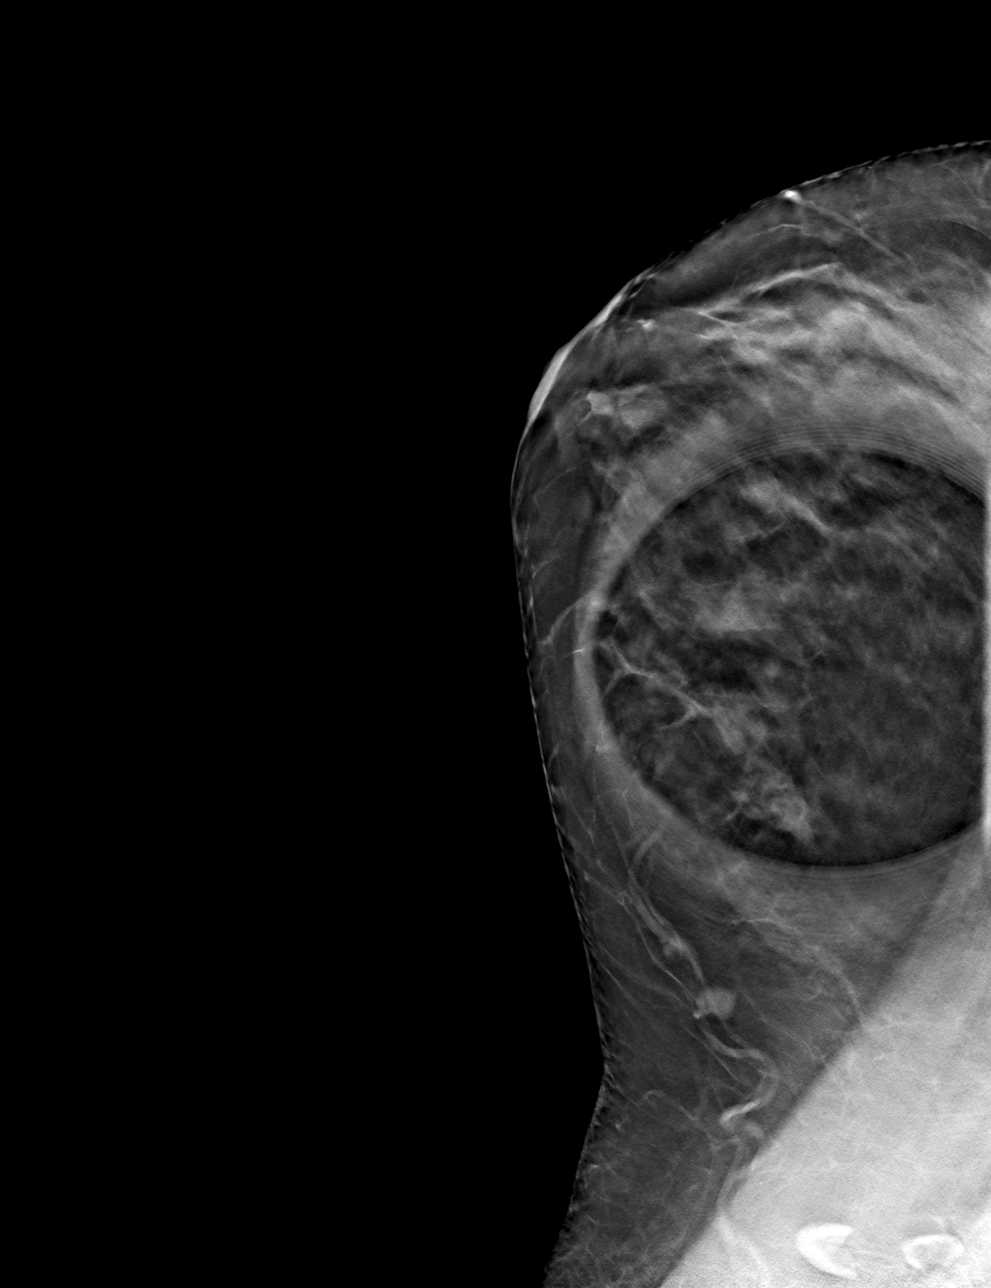

[6 of 14 positions shown; findings below may reference images not displayed]

ACR Breast Density Category c: The breast tissue is heterogeneously
dense, which may obscure small masses.
FINDINGS: There is persistent distortion in the left breast upper outer
quadrant on additional imaging.

Mammographic images were processed with CAD.

On physical exam, I palpate no abnormality in the left breast upper
outer quadrant.

Targeted ultrasound is performed, showing a hypoechoic irregular
spiculated mass left breast 2 o'clock location 4 cm from the nipple
measuring 1.3 x 0.8 x 0.8 cm. No left axillary lymphadenopathy is
identified.
IMPRESSION: Suspicious mass left breast 2 o'clock location. Ultrasound-guided
core biopsy has been scheduled 07/20/2014 at 9 a.m..

RECOMMENDATION:
Left ultrasound-guided core biopsy

I have discussed the findings and recommendations with the patient.
Results were also provided in writing at the conclusion of the
visit. If applicable, a reminder letter will be sent to the patient
regarding the next appointment.

BI-RADS CATEGORY  4: Suspicious.

## 2016-12-10 IMAGING — US US BREAST BX W LOC DEV 1ST LESION IMG BX SPEC US GUIDE*L*
1 series · 7 of 7 positions shown · non-contrast
Comparison: Previous exam(s).

ADDENDUM:
The final pathological diagnosis is grade 1 invasive mammary
carcinoma and invasive mammary carcinoma in situ. This is concordant
with the imaging findings. Surgical consultation is recommended.
Also recommended is consideration of preoperative breast MRI due to
mammographically dense breast tissue.

The final pathological diagnosis and recommendation were discussed
with the patient by telephone on 07/21/2014. Her questions were
answered. She reported pain at the biopsy site rated [DATE]. She
reported no bruising or palpable hematoma. She was given an
appointment with Dr. Bullock at [DATE] a.m. on 07/28/2014.
CLINICAL DATA: Patient with suspicious left breast mass.
EXAM:
ULTRASOUND GUIDED LEFT BREAST CORE NEEDLE BIOPSY

[Series 1: us breast bx w loc dev 1st lesion img bx spec us g · 0.07mm/px · 7 of 7 slices shown]
[im 1/7]
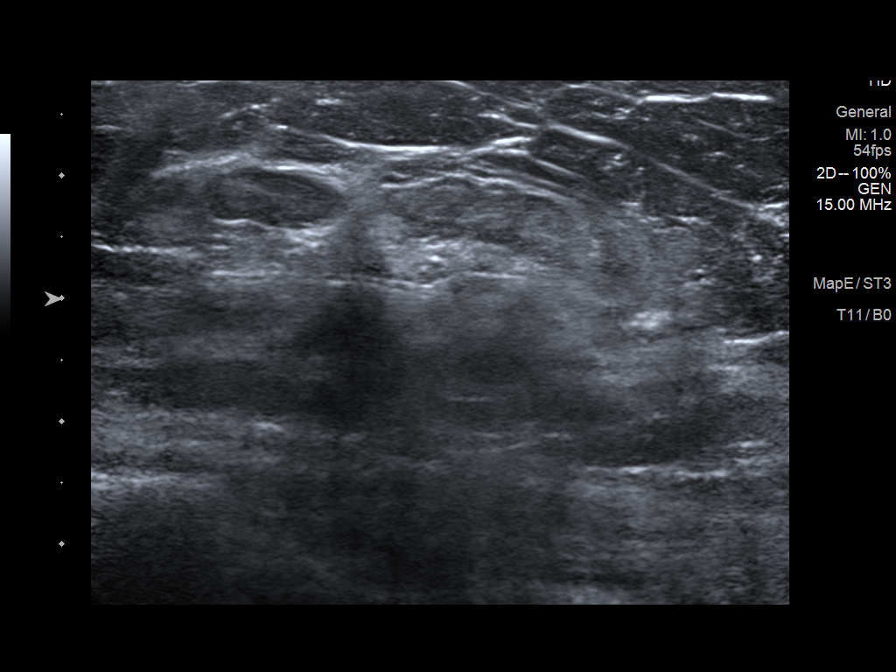
[im 2/7]
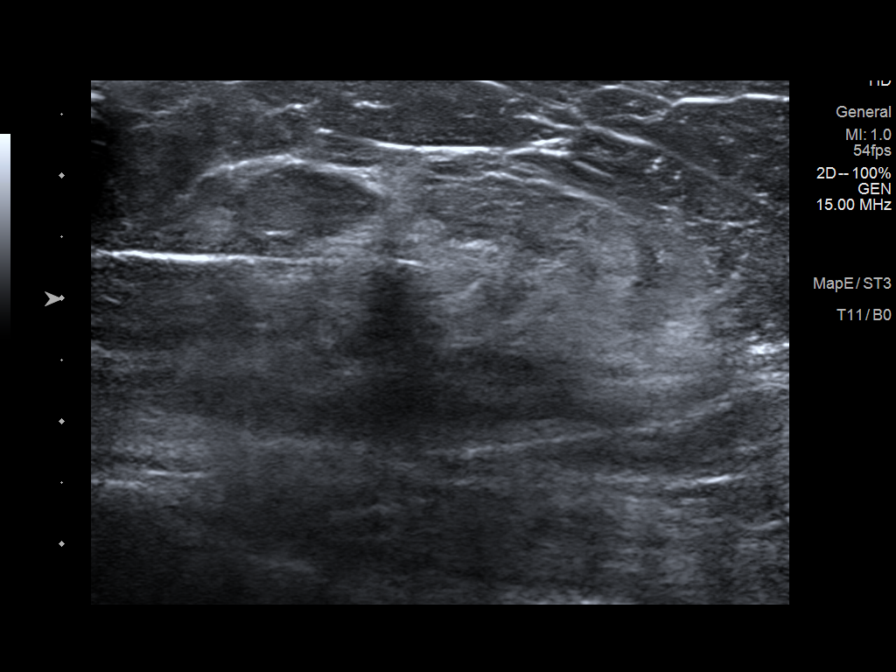
[im 3/7]
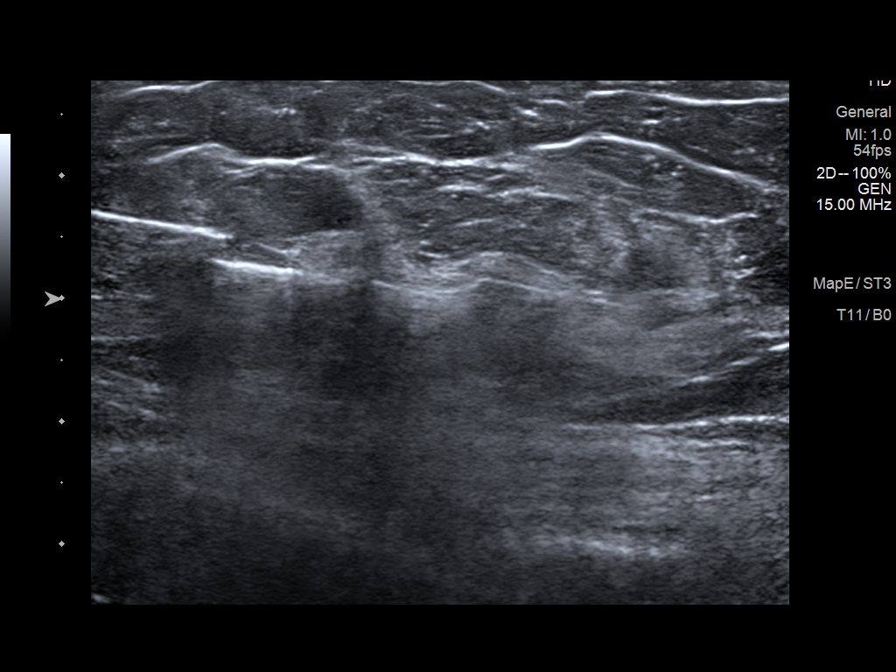
[im 4/7]
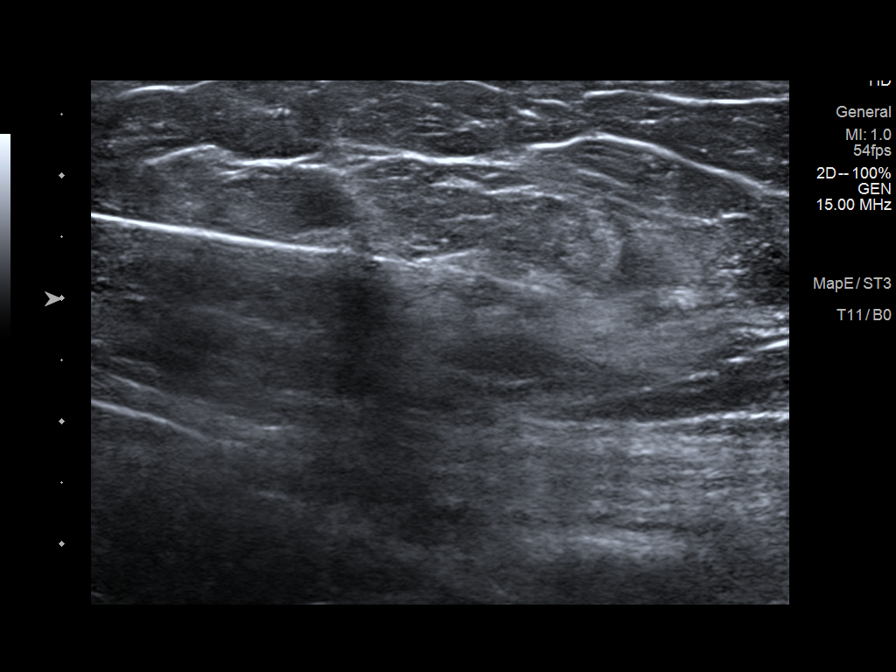
[im 5/7]
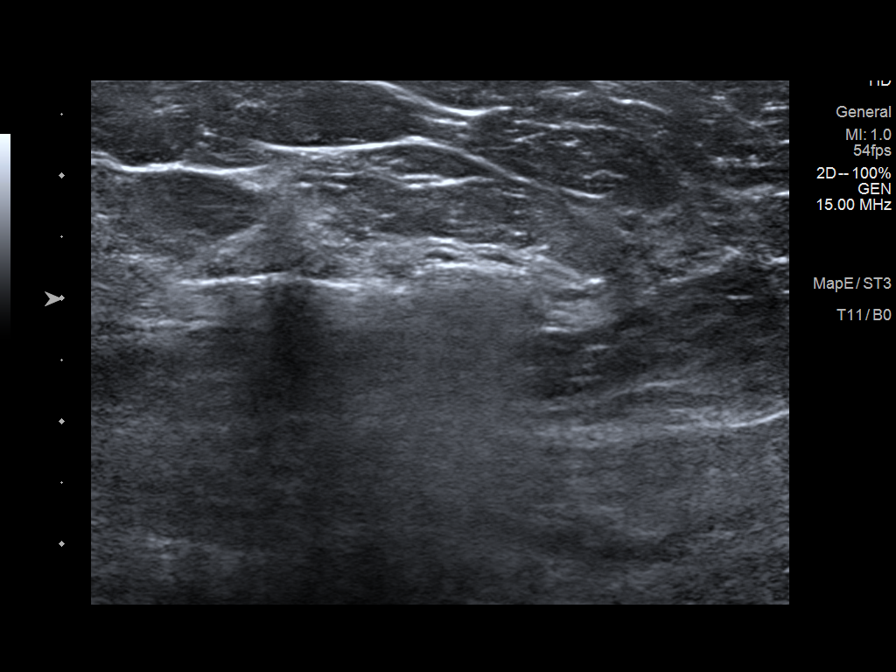
[im 6/7]
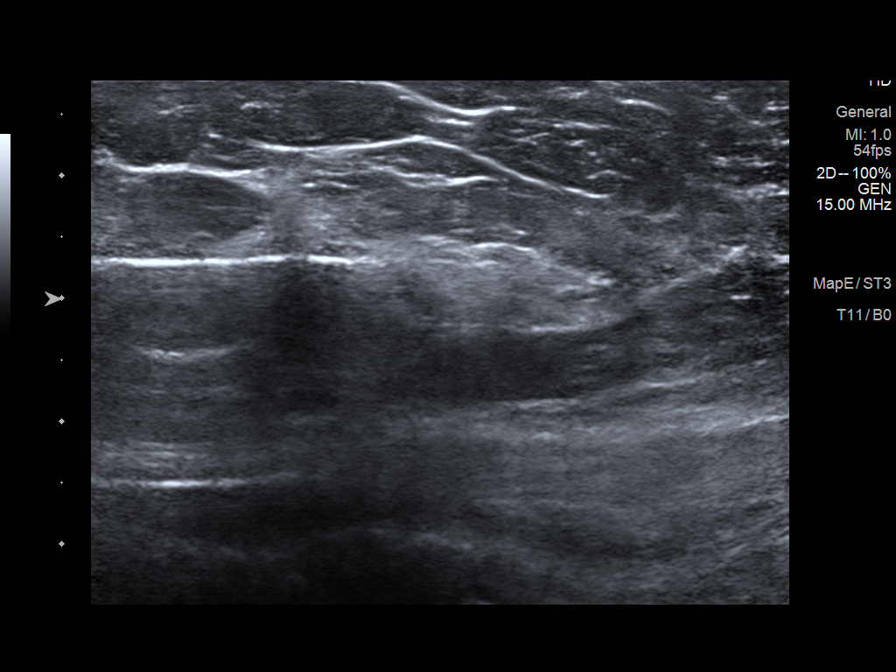
[im 7/7]
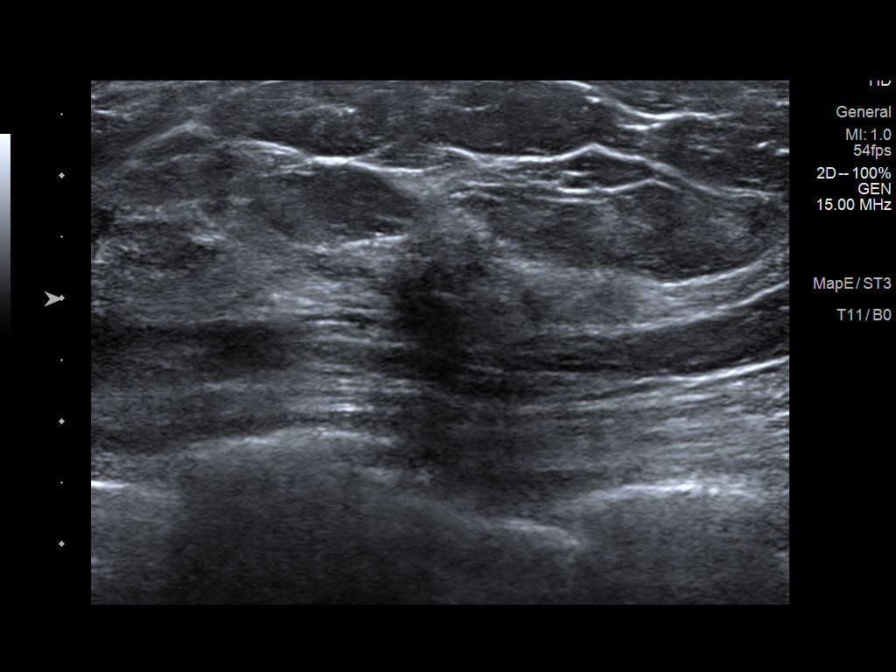

[7 of 7 positions shown; findings below may reference images not displayed]

PROCEDURE:
I met with the patient and we discussed the procedure of
ultrasound-guided biopsy, including benefits and alternatives. We
discussed the high likelihood of a successful procedure. We
discussed the risks of the procedure including infection, bleeding,
tissue injury, clip migration, and inadequate sampling. Informed
written consent was given. The usual time-out protocol was performed
immediately prior to the procedure.

Using sterile technique and 2% Lidocaine as local anesthetic, under
direct ultrasound visualization, a 12 gauge vacuum-assisted device
was used to perform biopsy of left breast mass, 2 o'clock
positionusing a lateral approach. At the conclusion of the
procedure, a ribbon shaped tissue marker clip was deployed into the
biopsy cavity. Follow-up 2-view mammogram was performed and dictated
separately.
IMPRESSION: Ultrasound-guided biopsy of left breast mass, 2 o'clock position. No
apparent complications.

## 2016-12-10 IMAGING — MG MM DIGITAL DIAGNOSTIC UNILAT*L*
2 series · 2 of 2 positions shown · non-contrast
Comparison: Previous exam(s).

CLINICAL DATA: Suspicious left breast mass status post
ultrasound-guided core needle biopsy.

EXAM:
DIAGNOSTIC LEFT MAMMOGRAM POST ULTRASOUND BIOPSY

[L CC]
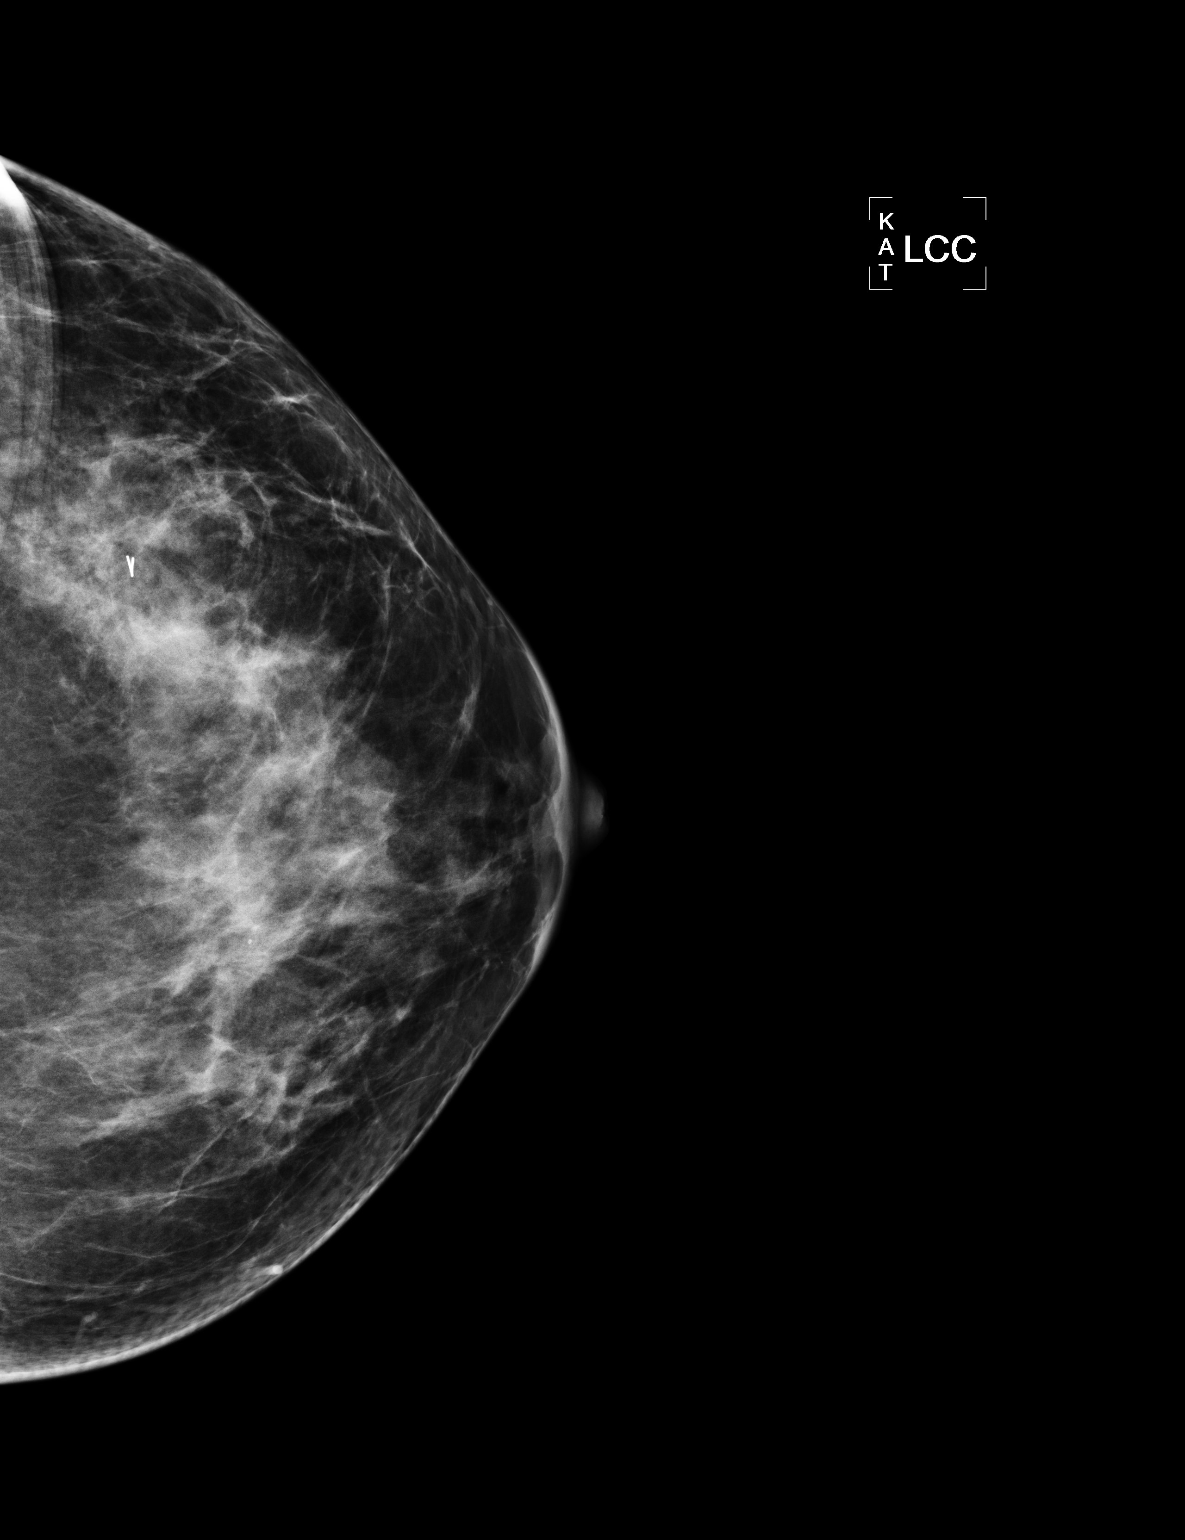

[L ML]
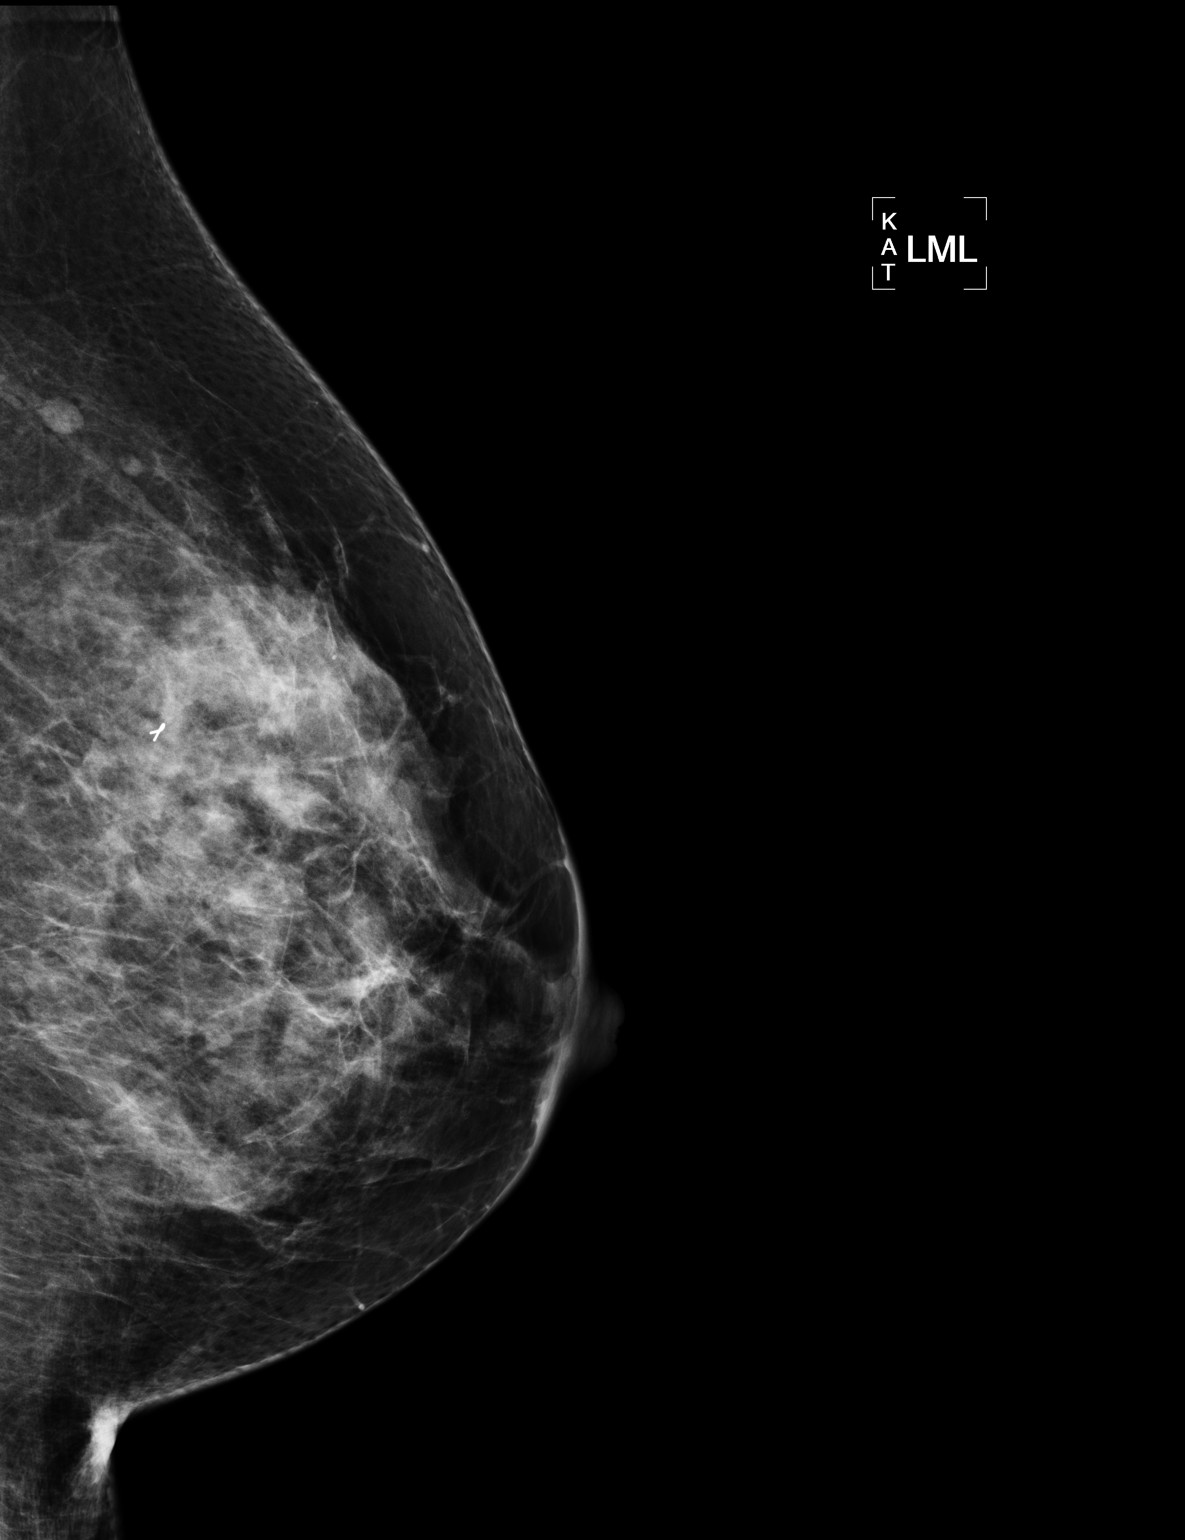

[2 of 2 positions shown; findings below may reference images not displayed]

FINDINGS: Mammographic images were obtained following ultrasound guided biopsy
of left breast mass, 2 o'clock position. Ribbon shaped marking clip
in appropriate position
IMPRESSION: Appropriate position ribbon shaped marking clip status post
ultrasound-guided core needle biopsy left breast mass.

Final Assessment: Post Procedure Mammograms for Marker Placement

## 2016-12-12 ENCOUNTER — Encounter: Payer: Self-pay | Admitting: *Deleted

## 2016-12-15 ENCOUNTER — Other Ambulatory Visit: Payer: Self-pay | Admitting: Hematology and Oncology

## 2016-12-15 ENCOUNTER — Telehealth: Payer: Self-pay | Admitting: *Deleted

## 2016-12-15 DIAGNOSIS — Z170 Estrogen receptor positive status [ER+]: Principal | ICD-10-CM

## 2016-12-15 DIAGNOSIS — C50412 Malignant neoplasm of upper-outer quadrant of left female breast: Secondary | ICD-10-CM

## 2016-12-15 NOTE — Telephone Encounter (Signed)
12/15/2016 Received telephone call from patient today, stating that she has a referral to a general surgeon on Friday, October 12th and will not be able to make her appointment due to the scheduling conflict. Appointment to be moved to next available date within protocol requirements to Monday, October 15th with 8am lab appointment followed by 8:15 MD appointment. Will notify scheduling accordingly. Reminded patient to bring pill diaries for both the PALLAS and ABC studies, as well as her pill bottles for the aspirin study, as we will be conducting study visits for both clinical trials. [name] BSN, RN, CCRP 12/15/2016 10:25 AM

## 2016-12-19 ENCOUNTER — Ambulatory Visit: Payer: PRIVATE HEALTH INSURANCE | Admitting: Hematology and Oncology

## 2016-12-19 ENCOUNTER — Other Ambulatory Visit: Payer: PRIVATE HEALTH INSURANCE

## 2016-12-22 ENCOUNTER — Encounter: Payer: PRIVATE HEALTH INSURANCE | Admitting: *Deleted

## 2016-12-22 ENCOUNTER — Other Ambulatory Visit: Payer: Self-pay

## 2016-12-22 ENCOUNTER — Other Ambulatory Visit (HOSPITAL_BASED_OUTPATIENT_CLINIC_OR_DEPARTMENT_OTHER): Payer: PRIVATE HEALTH INSURANCE

## 2016-12-22 ENCOUNTER — Ambulatory Visit (HOSPITAL_BASED_OUTPATIENT_CLINIC_OR_DEPARTMENT_OTHER): Payer: PRIVATE HEALTH INSURANCE | Admitting: Hematology and Oncology

## 2016-12-22 DIAGNOSIS — R590 Localized enlarged lymph nodes: Secondary | ICD-10-CM | POA: Diagnosis not present

## 2016-12-22 DIAGNOSIS — R079 Chest pain, unspecified: Secondary | ICD-10-CM

## 2016-12-22 DIAGNOSIS — M8580 Other specified disorders of bone density and structure, unspecified site: Secondary | ICD-10-CM

## 2016-12-22 DIAGNOSIS — Z170 Estrogen receptor positive status [ER+]: Principal | ICD-10-CM

## 2016-12-22 DIAGNOSIS — Z23 Encounter for immunization: Secondary | ICD-10-CM

## 2016-12-22 DIAGNOSIS — Z9221 Personal history of antineoplastic chemotherapy: Secondary | ICD-10-CM

## 2016-12-22 DIAGNOSIS — C50412 Malignant neoplasm of upper-outer quadrant of left female breast: Secondary | ICD-10-CM

## 2016-12-22 DIAGNOSIS — Z006 Encounter for examination for normal comparison and control in clinical research program: Secondary | ICD-10-CM

## 2016-12-22 DIAGNOSIS — Z79811 Long term (current) use of aromatase inhibitors: Secondary | ICD-10-CM

## 2016-12-22 DIAGNOSIS — Z923 Personal history of irradiation: Secondary | ICD-10-CM

## 2016-12-22 LAB — COMPREHENSIVE METABOLIC PANEL
ALT: 19 U/L (ref 0–55)
AST: 18 U/L (ref 5–34)
Albumin: 4 g/dL (ref 3.5–5.0)
Alkaline Phosphatase: 65 U/L (ref 40–150)
Anion Gap: 10 mEq/L (ref 3–11)
BUN: 8.3 mg/dL (ref 7.0–26.0)
CHLORIDE: 108 meq/L (ref 98–109)
CO2: 26 meq/L (ref 22–29)
Calcium: 8.7 mg/dL (ref 8.4–10.4)
Creatinine: 0.7 mg/dL (ref 0.6–1.1)
GLUCOSE: 103 mg/dL (ref 70–140)
Potassium: 3.6 mEq/L (ref 3.5–5.1)
SODIUM: 143 meq/L (ref 136–145)
TOTAL PROTEIN: 6.5 g/dL (ref 6.4–8.3)
Total Bilirubin: 0.67 mg/dL (ref 0.20–1.20)

## 2016-12-22 LAB — CBC WITH DIFFERENTIAL/PLATELET
BASO%: 0.2 % (ref 0.0–2.0)
Basophils Absolute: 0 10*3/uL (ref 0.0–0.1)
EOS%: 1.3 % (ref 0.0–7.0)
Eosinophils Absolute: 0.1 10*3/uL (ref 0.0–0.5)
HCT: 38.4 % (ref 34.8–46.6)
HGB: 12.8 g/dL (ref 11.6–15.9)
LYMPH%: 46.9 % (ref 14.0–49.7)
MCH: 32.7 pg (ref 25.1–34.0)
MCHC: 33.3 g/dL (ref 31.5–36.0)
MCV: 98.2 fL (ref 79.5–101.0)
MONO#: 0.4 10*3/uL (ref 0.1–0.9)
MONO%: 9.3 % (ref 0.0–14.0)
NEUT%: 42.3 % (ref 38.4–76.8)
NEUTROS ABS: 2 10*3/uL (ref 1.5–6.5)
Platelets: 130 10*3/uL — ABNORMAL LOW (ref 145–400)
RBC: 3.91 10*6/uL (ref 3.70–5.45)
RDW: 13.2 % (ref 11.2–14.5)
WBC: 4.7 10*3/uL (ref 3.9–10.3)
lymph#: 2.2 10*3/uL (ref 0.9–3.3)

## 2016-12-22 MED ORDER — INFLUENZA VAC SPLIT QUAD 0.5 ML IM SUSY
0.5000 mL | PREFILLED_SYRINGE | Freq: Once | INTRAMUSCULAR | Status: DC
  Filled 2016-12-22: qty 0.5

## 2016-12-22 MED ORDER — INV-ASPIRIN/PLACEBO 100 MG TABS ALLIANCE A011502: 1.0000 | ORAL_TABLET | Freq: Every day | ORAL | 0 refills | Status: DC

## 2016-12-22 NOTE — Progress Notes (Signed)
12/22/2016 Patient in to clinic unaccompanied today for AFT-05 PALLAS Cycle 21 visit and Alliance [id number] ABC Month 18 visit. Due to coordination with overlapping visit windows for both studies, PALLAS Cycle 21 visit is occurring +7 days from cycle start, as allowed per protocol, and ABC study visit is occurring -25 days from expected visit date, as allowed per protocol window. Based on lab results review and physical exam by Dr. Lindi Adie, patient meets criteria to continue treatment on the PALLAS and ABC trials.   AFT-05 PALLAS - Cycle 21, Day 1 Patient returned her completed anti-hormone therapy drug diary for Cycles 18 through 20. Due to patient appointment change related to scheduling, but within the protocol requirement for visit window within 7 days, Cycle 21 doses for Days 1-7 were recorded on the back of the Cycle 20 diary, and were transcribed onto the Cycle 21 diary by the patient today. Patient was given drug diaries for Cycles 21, 22 and 23 of anti-hormone therapy, beginning with 12/15/2016. Instructed patient to return the completed diaries at the next study visit, to be scheduled for 03/09/2017. See Adverse Event Log table below regarding AEs.   Alliance [id number] ABC - Month 18 visit Patient reports mild bruising (grade 1) associated with occasional trauma, e.g. bumps to her leg. Since August, she reports occasional intermittent mild vaginal bleeding with intercourse, as well as occasional mild hemorrhoidal hemorrhage with constipation; neither event requiring intervention. She reports one nosebleed episode occurring one month ago, not requiring intervention (grade 1). Previously reported mild heartburn resolved as of mid-May. She denies any blood in her urine or symptoms of gastritis, and there has been no documentation of other gastrointestinal bleeding or intracranial hemorrhage. She has experienced no cardiovascular events, including myocardial infarction, CVA or need for cardiovascular  procedures. She denies taking any aspirin or medications containing NSAIDs, and states that she takes only Tylenol for mild pain. Patient returned one previously dispensed medication bottles containing #12 aspirin 100mg /placebo tablets from the Month 12 dispensing visit, as confirmed by pharmacist Henreitta Leber. Inquired about empty bottle from the Month 12 dispensing, but patient says she does not have it and must have thrown it away. Reminded patient to return all bottles, either empty, full or partially filled, at subsequent study visits. Patient voiced understanding.  Two new bottles of study medication (aspirin 100mg /placebo x 90 tablets per bottle) were dispensed by pharmacist Henreitta Leber and were given to the patient for continued dosing through next study visit in April 2019. Patient returned completed medication logs for April through October 2018 showing no missed doses. The patient was given blank copies of medication logs for October 2108 through April 2019 for recording daily doses of IP. Month-Yr # pills dispensed Doses per mo.  Apr-18 180 1    May-18  31    Jun-18  30    Jul-18  31    Aug-18  31    Sep-18  30    Oct-18  14     180 168 12 Expected return     12 Actual return  [place] S. Brigitte Pulse BSN, RN, CCRP 12/22/2016 11:55 AM  Adverse Event Log 07/07/2016 - 12/22/2016 AFT-05 PALLAS Cycles 18-20 Alliance [id number] Month 18  Baseline AEs (grade): dizziness, int. (1); anxiety (2); hiccups, int. (1); lower extremity pain, int. (2) Event Grade Onset Date Resolved Date Attribution to anastrozole Attribution to aspirin/placebo Treatment Comments  Memory impairment 1 06/06/2016 ongoing No Unrelated Aricept    Musculoskeletal aches 1 01/09/2016 ongoing Yes Not  reportable None Intermittent in nature  Bruising 1 04/07/2016 ongoing No Probable None    Hypertension 1 07/07/2016 12/22/2016 No Unrelated None    Hypertension 2 12/22/2016 ongoing No Unrelated None   Surgical site pain, int. 1 01/09/2016  ongoing No Not reportable Tylenol    Weight gain 1 12/20/2015 ongoing Yes Not reportable None    Osteopenia 1 07/26/2015 ongoing Yes Not reportable None    Heartburn, int. 1 12/09/2015 07/22/2016 Yes Possible None Estimated stop date.  Constipation, int. 1 12/2015 ongoing Yes Not reportable None    Hemorrhoids 1 12/2015 ongoing No Not reportable None    Hematoma, abd. wall 1 03/20/2016 ongoing No Unlikely None    Elevated cholesterol 1 04/07/2016 10/08/2016 Yes Not reportable Crestor    Elevated triglycerides 1 04/07/2016 10/08/2016 Yes Not reportable None    Arm swelling, intermittent 1 06/08/2016 12/22/2016 No Not reportable Lasix    Arm swelling 2 12/22/2016 ongonig No Unrelated PT referral Worsening, readily apparent  Insomnia 2 09/07/2016 ongoing Yes Unrelated Ativan   Platelet count decreased 1 09/22/2016 ongoing NCS Not reportable None   Hemorrhoidal bleeding, int. 1 10/08/2016 ongoing No Unlikely None    Vaginal bleeding, int. 1 10/08/2016 ongoing No Not reportable None    Right lower lateral chest wall pain 2 11/17/2016 ongoing No Unrelated None Referral to general surgery  Shortness of breath 1 11/17/2016 ongoing No Not reportable None Associated with RUQ/lower thorax pain  Epistaxis 1 11/22/2016 11/22/2016 No Possible None   [name] BSN, RN, [place] 01/01/2017 1:34 PM

## 2016-12-22 NOTE — Progress Notes (Signed)
Patient Care Team: Chipper Herb, MD as PCP - General (Family Medicine) Benson Norway, RN as Registered Nurse (Oncology) Nicholas Lose, MD as Consulting Physician (Hematology and Oncology) Coralie Keens, MD as Consulting Physician (General Surgery) Kyung Rudd, MD as Consulting Physician (Radiation Oncology) Sylvan Cheese, NP as Nurse Practitioner (Hematology and Oncology)  DIAGNOSIS:  Encounter Diagnosis  Name Primary?  . Malignant neoplasm of upper-outer quadrant of left breast in female, estrogen receptor positive ([place])     SUMMARY OF ONCOLOGIC HISTORY:   Malignant neoplasm of upper-outer quadrant of left breast in female, estrogen receptor positive ([place])   07/18/2014 Mammogram    Distortion left breast, breast density category C; U/S 1.3 x 0.8 x 0.8 cm left breast mass at 2:00 position 4 cm from nipple, no lymph nodes      07/20/2014 Initial Diagnosis    Left breast Biopsy: Invasive lobular cancer with LCIS, Grade 1, ER 86%, PR 78%, Her 2 Neg Ratio 1.77, Ki 67: 14%      08/01/2014 Breast MRI    Breast MRI showed non-mass enhancement 3.9 cm, no lymph nodes      08/01/2014 Clinical Stage    Stage IIA: T2 N0      08/08/2014 Surgery    Left breast lumpectomy: 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, with LCIS, medial and inferior margins positive, 3/4 lymph nodes positive      08/08/2014 Pathologic Stage    Stage IIB: T2 N1c M0      08/16/2014 Surgery    Left breast medial margin reexcision residual ILC 0.2 cm; inferior margin residual foci less than 0.2 cm, 1/5 lymph nodes positive, 1 lymph node with isolated tumor cells (Overall 4/10)      09/04/2014 - 01/15/2015 Chemotherapy    Adjuvant chemotherapy with dose dense Adriamycin and Cytoxan 4 followed by Abraxane weekly 8 ( stopped early for profound neutropenia and thrombocytopenia)      01/29/2015 - 03/20/2015 Radiation Therapy    Adjuvant XRT [facility]): 50.4 Gy over 28 fractions; seroma boost: 10 Gy  over 5 fractions. Total dose: 60.4 Gy      04/11/2015 -  Anti-estrogen oral therapy    Anastrozole 1 mg daily (PALLAS clinical trial 05/24/2015 patient was randomized to hormone therapy alone)      05/24/2015 Survivorship    Survivorship visit completed and copy of care plan given to patient      11/26/2016 PET scan    Small Left sided level 2 LN with hypermetabolism, post surg changes in breast and small right groin lymph node likely infectious/inflammatory, gallstones       CHIEF COMPLIANT: follow-up of right-sided abdominal discomfort, follow-up of PALLAS and ABC trials  INTERVAL HISTORY: [name] is a 56 year old with above-mentioned history of found left breast cancer who is currently on adjuvant antiestrogen therapy with anastrozole. She has a tender spot on the right lower lateral chest wall which has been bothering her. She had a PET/CT scan last month which did not show any metastatic disease in that location. She had a small cervical lymph node which is being scheduled for a biopsy. She is seeing a Psychologist, sport and exercise at [facility] because of his right sided chest wall pain. She has gallstones on the scan but the pain is not in the vicinity of the gallbladder area and also it is very reproducible suggesting a chest wall (fat or rib) etiology. He continues to have muscle and joint stiffness related to anastrozole. She is  concerned about her weight issues. She has been doing everything she can but cannot lose the weight. No further problems with bruising related to the ABC trial.  REVIEW OF SYSTEMS:   Constitutional: Denies fevers, chills or abnormal weight loss Eyes: Denies blurriness of vision Ears, nose, mouth, throat, and face: Denies mucositis or sore throat Respiratory: Denies cough, dyspnea or wheezes Cardiovascular: Denies palpitation, Complains of right lower lateral chest wall discomfort Gastrointestinal:  Denies nausea, heartburn or change in bowel habits Skin:  Denies abnormal skin rashes Lymphatics: Denies new lymphadenopathy or easy bruising Neurological:Denies numbness, tingling or new weaknesses Behavioral/Psych: Mood is stable, no new changes  Extremities: No lower extremity edema All other systems were reviewed with the patient and are negative.  I have reviewed the past medical history, past surgical history, social history and family history with the patient and they are unchanged from previous note.  ALLERGIES:  is allergic to statins; crestor [rosuvastatin]; lipitor [atorvastatin]; and temazepam.  MEDICATIONS:  Current Outpatient Prescriptions  Medication Sig Dispense Refill  . acetaminophen (TYLENOL) 325 MG tablet Take 975 mg by mouth daily as needed for moderate pain. Usually takes twice daily in morning and bedtime and additionally during the day if needed for pain    . anastrozole (ARIMIDEX) 1 MG tablet Take 1 tablet (1 mg total) by mouth daily. 90 tablet 3  . anastrozole (ARIMIDEX) 1 MG tablet TAKE 1 TABLET EVERY DAY 90 tablet 2  . Biotin 1000 MCG tablet Take 1,000 mcg by mouth daily.    . clonazePAM (KLONOPIN) 0.5 MG tablet Take 1 tablet (0.5 mg total) by mouth 3 (three) times daily as needed. 90 tablet 5  . donepezil (ARICEPT) 10 MG tablet Take 1 tablet (10 mg total) by mouth at bedtime. 90 tablet 3  . furosemide (LASIX) 20 MG tablet Take 1 tablet (20 mg total) by mouth daily. 30 tablet 3  . Investigational aspirin/placebo 100 MG tablet Alliance [id number] Take 1 tablet by mouth daily. Take with food or a full glass of water. Do not crush enteric coated tablets. 180 tablet 0  . LORazepam (ATIVAN) 1 MG tablet TAKE 1 TABLET BY MOUTH AT BEDTIME 30 tablet 0  . PARoxetine (PAXIL-CR) 25 MG 24 hr tablet Take 1 tablet (25 mg total) by mouth daily. 90 tablet 3  . rosuvastatin (CRESTOR) 10 MG tablet Take 1 tablet (10 mg total) by mouth daily. 90 tablet 3  . Turmeric 500 MG TABS Take 1 tablet by mouth daily.    . Vitamin D, Ergocalciferol,  (DRISDOL) 50000 units CAPS capsule TAKE 1 CAPSULE (50,000 UNITS TOTAL) BY MOUTH EVERY 7 (SEVEN) DAYS. 12 capsule 3   No current facility-administered medications for this visit.    Facility-Administered Medications Ordered in Other Visits  Medication Dose Route Frequency Provider Last Rate Last Dose  . Influenza vac split quadrivalent PF (FLUARIX) injection 0.5 mL  0.5 mL Intramuscular Once Nicholas Lose, MD        PHYSICAL EXAMINATION: ECOG PERFORMANCE STATUS: 1 - Symptomatic but completely ambulatory  Vitals:   12/22/16 0825  BP: (!) 143/76  Pulse: 73  Resp: 20  Temp: 98.3 F (36.8 C)  SpO2: 95%   Filed Weights   12/22/16 0825  Weight: 163 lb 4.8 oz (74.1 kg)    GENERAL:alert, no distress and comfortable SKIN: skin color, texture, turgor are normal, no rashes or significant lesions EYES: normal, Conjunctiva are pink and non-injected, sclera clear OROPHARYNX:no exudate, no erythema and lips, buccal mucosa, and tongue  normal  NECK: supple, thyroid normal size, non-tender, without nodularity LYMPH:  no palpable lymphadenopathy in the cervical, axillary or inguinal LUNGS: clear to auscultation and percussion with normal breathing effort HEART: regular rate & rhythm and no murmurs and no lower extremity edema ABDOMEN:tender spot on the right lateral lower chest wall MUSCULOSKELETAL:no cyanosis of digits and no clubbing  NEURO: alert & oriented x 3 with fluent speech, no focal motor/sensory deficits EXTREMITIES: No lower extremity edema BREAST: No palpable masses or nodules in either right or left breasts. No palpable axillary supraclavicular or infraclavicular adenopathy no breast tenderness or nipple discharge. (exam performed in the presence of a chaperone)  LABORATORY DATA:  I have reviewed the data as listed   Chemistry      Component Value Date/Time   NA 143 12/22/2016 0801   K 3.6 12/22/2016 0801   CL 99 (L) 09/09/2014 2336   CO2 26 12/22/2016 0801   BUN 8.3  12/22/2016 0801   CREATININE 0.7 12/22/2016 0801      Component Value Date/Time   CALCIUM 8.7 12/22/2016 0801   ALKPHOS 65 12/22/2016 0801   AST 18 12/22/2016 0801   ALT 19 12/22/2016 0801   BILITOT 0.67 12/22/2016 0801       Lab Results  Component Value Date   WBC 4.7 12/22/2016   HGB 12.8 12/22/2016   HCT 38.4 12/22/2016   MCV 98.2 12/22/2016   PLT 130 (L) 12/22/2016   NEUTROABS 2.0 12/22/2016    ASSESSMENT & PLAN:  Malignant neoplasm of upper-outer quadrant of left breast in female, estrogen receptor positive ([place]) Left breast lumpectomy 08/08/2014: 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 Adjuvant chemotherapy with dose dense Adriamycin and Cytoxan x 4 followed by Abraxane weekly 12 because of lymph node positive disease. Started 09/04/14 to 01/15/15 2. adjuvant radiation completed 03/20/15 3. Followed by antiestrogen therapy started 04/03/15 -------------------------------------------------------------------------------------------------------------------------- Anastrozole toxicities: Patient denies any hot flashes. 1. Complains of occasional myalgias 2. Weight gain:  based on the BMI she is overweight.  Bone density 07/26/2015: T score -1.9, osteopenia  PALLAS Trial : Patient has been randomized to anastrozole alone. ABC clinical trial: The trial randomizes between aspirin versus placebo. She went on trial 07/26/2015 Currently on 100 mg of dosage. Bruising has become markedly reduced.  Adverse effects: 1. Bruising/ Hematoma: Resolved after dose reduction. She is currently going to be on 100 mg of  aspirin/placebo 2. bleeding from mucous membranes especially GI, vaginal, nose, gums, nailbeds: Resolved  Cognitive dysfunction: OnAricept 10 mg daily. Memory function has improved significantly  Weight gain:On Weight Watchers, now doing Keto diet  Breast cancer surveillance: 1. Mammogram done 07/23/2016 showed asymmetry right breast which was evaluated by ultrasound which did not show any abnormality 2. Breast exam 09/22/2016: No palpable lumps or nodules of concern. 3. PET/CT 11/26/2016:Small Left sided level 2 LN with hypermetabolism, post surg changes in breast and small right groin lymph node likely infectious/inflammatory, gallstones Return to clinic in 6 months for follow-up  Plan: [facility] evaluation for right-sided abdominal pain [facility] Biopsy of the cervical lymph node Flu shot given today.  Return to clinic 03/09/2017  I spent 25 minutes talking to the patient of which more than half was spent in counseling and coordination of care.  Orders Placed This Encounter  Procedures  . CBC with Differential    Standing Status:   Future    Standing Expiration Date:   12/22/2017  . Comprehensive metabolic panel    Standing Status:   Future    Standing Expiration Date:   12/22/2017   The patient has a good understanding of the overall plan. she agrees with it. she will call with any problems that may develop before the next visit here.   Rulon Eisenmenger, MD 12/22/16

## 2016-12-22 NOTE — Assessment & Plan Note (Signed)
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 Adjuvant chemotherapy with dose dense Adriamycin and Cytoxan x 4 followed by Abraxane weekly 12 because of lymph node positive disease. Started 09/04/14 to 01/15/15 2. adjuvant radiation completed 03/20/15 3. Followed by antiestrogen therapy started 04/03/15 -------------------------------------------------------------------------------------------------------------------------- Anastrozole toxicities: Patient denies any hot flashes. 1. Complains of occasional myalgias 2. Weight gain:  based on the BMI she is overweight.  Bone density 07/26/2015: T score -1.9, osteopenia  PALLAS Trial : Patient has been randomized to anastrozole alone. ABC clinical trial: The trial randomizes between aspirin versus placebo. She went on trial 07/26/2015 Currently on 100 mg of dosage. Bruising has become markedly reduced.  Adverse effects: 1. Bruising/ Hematoma: Resolved after dose reduction. She is currently going to be on 100 mg of aspirin/placebo 2. bleeding from mucous membranes especially GI, vaginal, nose, gums, nailbeds: Resolved  Cognitive dysfunction: OnAricept 10 mg daily. Memory function has improved significantly  Weight gain:On Weight Watchers.  Breast cancer surveillance: 1. Mammogram done 07/23/2016 showed asymmetry right breast which was evaluated by ultrasound which did not show any abnormality 2. Breast exam 09/22/2016: No palpable lumps or nodules of concern. 3. PET/CT 11/26/2016:Small Left sided level 2 LN with hypermetabolism, post surg  changes in breast and small right groin lymph node likely infectious/inflammatory, gallstones Return to clinic in 6 months for follow-up  Plan: [facility] Biopsy of the cervical lymph node

## 2016-12-24 ENCOUNTER — Telehealth: Payer: Self-pay

## 2016-12-24 NOTE — Telephone Encounter (Signed)
Records printed for [facility],follow up appts had not been made yet from 12/22/16 DAR.  Appts made per 12/22/16 LOS and a copy has been mailed to the patient.

## 2016-12-30 HISTORY — PX: CHOLECYSTECTOMY: SHX55

## 2016-12-31 ENCOUNTER — Other Ambulatory Visit: Payer: Self-pay | Admitting: Radiology

## 2016-12-31 ENCOUNTER — Other Ambulatory Visit: Payer: Self-pay | Admitting: General Surgery

## 2017-01-01 ENCOUNTER — Ambulatory Visit (HOSPITAL_COMMUNITY)
Admission: RE | Admit: 2017-01-01 | Discharge: 2017-01-01 | Disposition: A | Discharge status: Home / Self Care | Payer: PRIVATE HEALTH INSURANCE | Source: Ambulatory Visit | Attending: Hematology and Oncology | Admitting: Hematology and Oncology

## 2017-01-01 ENCOUNTER — Encounter (HOSPITAL_COMMUNITY): Payer: Self-pay

## 2017-01-01 ENCOUNTER — Other Ambulatory Visit: Payer: Self-pay | Admitting: Hematology and Oncology

## 2017-01-01 DIAGNOSIS — C50412 Malignant neoplasm of upper-outer quadrant of left female breast: Secondary | ICD-10-CM | POA: Diagnosis present

## 2017-01-01 DIAGNOSIS — Z9049 Acquired absence of other specified parts of digestive tract: Secondary | ICD-10-CM | POA: Diagnosis not present

## 2017-01-01 DIAGNOSIS — E785 Hyperlipidemia, unspecified: Secondary | ICD-10-CM | POA: Diagnosis not present

## 2017-01-01 DIAGNOSIS — Z170 Estrogen receptor positive status [ER+]: Secondary | ICD-10-CM | POA: Diagnosis present

## 2017-01-01 DIAGNOSIS — H8109 Meniere's disease, unspecified ear: Secondary | ICD-10-CM | POA: Diagnosis not present

## 2017-01-01 DIAGNOSIS — Z888 Allergy status to other drugs, medicaments and biological substances status: Secondary | ICD-10-CM | POA: Insufficient documentation

## 2017-01-01 DIAGNOSIS — Z9851 Tubal ligation status: Secondary | ICD-10-CM | POA: Diagnosis not present

## 2017-01-01 DIAGNOSIS — Z9889 Other specified postprocedural states: Secondary | ICD-10-CM | POA: Diagnosis not present

## 2017-01-01 DIAGNOSIS — G4700 Insomnia, unspecified: Secondary | ICD-10-CM | POA: Diagnosis not present

## 2017-01-01 DIAGNOSIS — R599 Enlarged lymph nodes, unspecified: Secondary | ICD-10-CM | POA: Diagnosis not present

## 2017-01-01 DIAGNOSIS — Z8371 Family history of colonic polyps: Secondary | ICD-10-CM | POA: Diagnosis not present

## 2017-01-01 LAB — CBC
HCT: 39.6 % (ref 36.0–46.0)
Hemoglobin: 13.2 g/dL (ref 12.0–15.0)
MCH: 32.4 pg (ref 26.0–34.0)
MCHC: 33.3 g/dL (ref 30.0–36.0)
MCV: 97.3 fL (ref 78.0–100.0)
Platelets: 172 10*3/uL (ref 150–400)
RBC: 4.07 MIL/uL (ref 3.87–5.11)
RDW: 13.2 % (ref 11.5–15.5)
WBC: 8.6 10*3/uL (ref 4.0–10.5)

## 2017-01-01 LAB — PROTIME-INR
INR: 0.87
Prothrombin Time: 11.8 seconds (ref 11.4–15.2)

## 2017-01-01 LAB — APTT: aPTT: 26 seconds (ref 24–36)

## 2017-01-01 MED ORDER — LIDOCAINE HCL 2 % IJ SOLN
INTRAMUSCULAR | Status: AC
  Filled 2017-01-01: qty 10

## 2017-01-01 MED ORDER — SODIUM CHLORIDE 0.9 % IV SOLN
INTRAVENOUS | Status: DC
  Administered 2017-01-01: 11:00:00 via INTRAVENOUS

## 2017-01-01 NOTE — H&P (Signed)
Chief Complaint: hypermetabolic left cervical LN  Referring Physician:Dr. Nicholas Lose  Supervising Physician: Markus Daft  Patient Status: [facility] - Out-pt  HPI: [name] is a 56 y.o. female with a history of breast cancer who is under the care of Dr. Lindi Adie.  She had a PET scan about a month ago that revealed a hypermetabolic LN in the left cervical chain.  She was also complaining of right lower chest wall pain.  Her scan revealed gallstones.  She addressed this first and had a lap chole 2 days ago.  She presents today for a biopsy of the hypermetabolic LN.  She does not want sedation medication.  Past Medical History:  Past Medical History:  Diagnosis Date  . Abnormal Pap smear of cervix 2002   Dr. Eilleen Kempf   . Allergy 2012   seasonal  . Anxiety   . Benign hemangioma    Dr,  Marybelle Killings   . Breast cancer ([place]) 07/20/14   Left Breast  . Cervical stenosis (uterine cervix) 2002  . Depression   . Dizziness 1991   secondary to Meniere's disease  . Hyperlipidemia 2007  . Insomnia 1997  . Meniere's disease 1991  . Nervousness(799.21) 1987    Past Surgical History:  Past Surgical History:  Procedure Laterality Date  . AXILLARY LYMPH NODE DISSECTION Left 08/16/2014   Procedure: AXILLARY LYMPH NODE DISSECTION;  Surgeon: Coralie Keens, MD;  Location: [place];  Service: General;  Laterality: Left;  . bilateral  tubal ligation  05/08/2004  . BREAST LUMPECTOMY WITH RADIOACTIVE SEED AND SENTINEL LYMPH NODE BIOPSY Left 08/08/2014   Procedure: BREAST LUMPECTOMY WITH RADIOACTIVE SEED AND SENTINEL LYMPH NODE BIOPSY;  Surgeon: Coralie Keens, MD;  Location: [place];  Service: General;  Laterality: Left;  . CHOLECYSTECTOMY  12/30/2016  . MASTOPEXY Right 12/14/2015   Procedure: RIGHT BREAST MASTOPEXY;  Surgeon: Irene Limbo, MD;  Location: [place];  Service: Plastics;  Laterality: Right;  . PORT-A-CATH REMOVAL N/A 03/08/2015   Procedure: REMOVAL PORT-A-CATH;  Surgeon: Coralie Keens, MD;  Location: [facility];  Service: General;  Laterality: N/A;  . PORTACATH PLACEMENT Right 08/16/2014   Procedure: INSERTION PORT-A-CATH;  Surgeon: Coralie Keens, MD;  Location: [place];  Service: General;  Laterality: Right;  . RE-EXCISION OF BREAST LUMPECTOMY Left 08/16/2014   Procedure: RE-EXCISION OF LEFT BREAST CANCER AND PORT PLACEMENT/POSSIBLE AXILLARY DISECTION;  Surgeon: Coralie Keens, MD;  Location: [place];  Service: General;  Laterality: Left;  . right breast surg     [place] Right 07/28/2012   Procedure: RIGHT SHOULDER DECOMPRESSION AND EXCISION OF CALCIFIC DEPOSIT;  Surgeon: Tobi Bastos, MD;  Location: [facility];  Service: Orthopedics;  Laterality: Right;  . TUBAL LIGATION    . uterine ablation  02/2006    Due to heavy periods Dr. Jon Billings   . wisdom teeth extractions  yrs ago    Family History:  Family History  Problem Relation Age of Onset  . Cirrhosis Father   . Colon polyps Mother   . Colon cancer Neg Hx     Social History:  reports that she has never smoked. She has never used smokeless tobacco. She reports that she drinks alcohol. She reports that she does not use drugs.  Allergies:  Allergies  Allergen Reactions  . Statins Other (See Comments)  . Crestor [Rosuvastatin] Other (See Comments)    myalgias  . Lipitor [  Atorvastatin] Other (See Comments)    myalgias  . Temazepam Other (See Comments)    Pt does not remember (Restoril)    Medications: Medications reviewed in epic  Please HPI for pertinent positives, otherwise complete 10 system ROS negative    Physical Exam: Temp (F)   98.1  98.1 (36.7)  10/25 1105  Pulse Rate   64  64  10/25 1105  Resp   18  18  10/25 1105  BP   147/91  147/91  10/25 1105  SpO2 (%)   96  96  10/25 1105  Weight (lb)   161  161 lb 2 oz (73.1 kg)  10    General: pleasant, WD, WN  white female who is laying in bed in NAD HEENT: head is normocephalic, atraumatic.  Sclera are noninjected.  PERRL.  Ears and nose without any masses or lesions.  Mouth is pink and moist Heart: regular, rate, and rhythm.  Normal s1,s2. No obvious murmurs, gallops, or rubs noted.  Palpable radial and pedal pulses bilaterally Lungs: CTAB, no wheezes, rhonchi, or rales noted.  Respiratory effort nonlabored Abd: soft, appropriately tender follow lap chole, ND, +BS, no masses, hernias, or organomegaly.  Her incisions are covered with dermabond.  Ecchymosis surrounding multiple incisions, but specifically the umbilical incision. Psych: A&Ox3 with an appropriate affect.   Labs: WBC 8.6     RBC 4.07     Hemoglobin 13.2     HCT 39.6     Platelets 172        Imaging: No results found.  Assessment/Plan 1. Left cervical hypermetabolic LN, h/o breast cancer  Move forward with a biopsy of this lymph node.  The patient does not want sedation.  Her labs have been reviewed as well as her vitals.  She is on an experimental trial for ASA vs no ASA.  She feels she is likely being given the ASA as she bleeds more easily than normal.  Her plts are normal and this is a low dose pill she is being given.  She understands the slight increase risk of bleeding.  Risks and benefits discussed with the patient including, but not limited to bleeding, infection, damage to adjacent structures or low yield requiring additional tests. All of the patient's questions were answered, patient is agreeable to proceed. Consent signed and in chart.  Thank you for this interesting consult.  I greatly enjoyed meeting [name] and look forward to participating in their care.  A copy of this report was sent to the requesting provider on this date.  Electronically Signed: Henreitta Cea 01/01/2017, 11:55 AM   I spent a total of  30 Minutes   in face to face in clinical consultation, greater than 50% of which was  counseling/coordinating care for hypermetabolic left cervical lymph node

## 2017-01-02 ENCOUNTER — Ambulatory Visit: Payer: PRIVATE HEALTH INSURANCE | Admitting: Physical Therapy

## 2017-01-07 ENCOUNTER — Ambulatory Visit: Payer: PRIVATE HEALTH INSURANCE | Attending: Hematology and Oncology | Admitting: Physical Therapy

## 2017-01-15 ENCOUNTER — Other Ambulatory Visit: Payer: Self-pay

## 2017-01-23 ENCOUNTER — Other Ambulatory Visit: Payer: Self-pay | Admitting: Hematology and Oncology

## 2017-02-18 ENCOUNTER — Other Ambulatory Visit: Payer: Self-pay

## 2017-02-18 ENCOUNTER — Ambulatory Visit: Payer: PRIVATE HEALTH INSURANCE | Attending: Hematology and Oncology | Admitting: Physical Therapy

## 2017-02-18 ENCOUNTER — Encounter: Payer: Self-pay | Admitting: Physical Therapy

## 2017-02-18 ENCOUNTER — Ambulatory Visit: Payer: PRIVATE HEALTH INSURANCE | Admitting: Physical Therapy

## 2017-02-18 DIAGNOSIS — R293 Abnormal posture: Secondary | ICD-10-CM | POA: Insufficient documentation

## 2017-02-18 DIAGNOSIS — L599 Disorder of the skin and subcutaneous tissue related to radiation, unspecified: Secondary | ICD-10-CM | POA: Insufficient documentation

## 2017-02-18 DIAGNOSIS — M6281 Muscle weakness (generalized): Secondary | ICD-10-CM | POA: Diagnosis present

## 2017-02-18 DIAGNOSIS — I890 Lymphedema, not elsewhere classified: Secondary | ICD-10-CM | POA: Diagnosis not present

## 2017-02-18 NOTE — Therapy (Signed)
[place], [place], [address] Phone: [phone]   Fax:  [phone]  Physical Therapy Evaluation  Patient Details  Name: [name] MRN: [id number] Date of Birth: May 18, 1960 Referring Provider: Dr.Gudena    Encounter Date: 02/18/2017  PT End of Session - 02/18/17 1200    Visit Number  1    Number of Visits  17 may have trouble scheduling visits     Date for PT Re-Evaluation  04/21/16    PT Start Time  1015    PT Stop Time  1115    PT Time Calculation (min)  60 min    Activity Tolerance  Patient tolerated treatment well    Behavior During Therapy  [facility] for tasks assessed/performed       Past Medical History:  Diagnosis Date  . Abnormal Pap smear of cervix 2002   Dr. Eilleen Kempf   . Allergy 2012   seasonal  . Anxiety   . Benign hemangioma    Dr,  Marybelle Killings   . Breast cancer ([place]) 07/20/14   Left Breast  . Cervical stenosis (uterine cervix) 2002  . Depression   . Dizziness 1991   secondary to Meniere's disease  . Hyperlipidemia 2007  . Insomnia 1997  . Meniere's disease 1991  . Nervousness(799.21) 1987    Past Surgical History:  Procedure Laterality Date  . AXILLARY LYMPH NODE DISSECTION Left 08/16/2014   Procedure: AXILLARY LYMPH NODE DISSECTION;  Surgeon: Coralie Keens, MD;  Location: [place];  Service: General;  Laterality: Left;  . bilateral  tubal ligation  05/08/2004  . BREAST LUMPECTOMY WITH RADIOACTIVE SEED AND SENTINEL LYMPH NODE BIOPSY Left 08/08/2014   Procedure: BREAST LUMPECTOMY WITH RADIOACTIVE SEED AND SENTINEL LYMPH NODE BIOPSY;  Surgeon: Coralie Keens, MD;  Location: [place];  Service: General;  Laterality: Left;  . CHOLECYSTECTOMY  12/30/2016  . MASTOPEXY Right 12/14/2015   Procedure: RIGHT BREAST MASTOPEXY;  Surgeon: Irene Limbo, MD;  Location: [place];  Service: Plastics;  Laterality: Right;  .  PORT-A-CATH REMOVAL N/A 03/08/2015   Procedure: REMOVAL PORT-A-CATH;  Surgeon: Coralie Keens, MD;  [facility];  Service: General;  Laterality: N/A;  . PORTACATH PLACEMENT Right 08/16/2014   Procedure: INSERTION PORT-A-CATH;  Surgeon: Coralie Keens, MD;  Location: [place];  Service: General;  Laterality: Right;  . RE-EXCISION OF BREAST LUMPECTOMY Left 08/16/2014   Procedure: RE-EXCISION OF LEFT BREAST CANCER AND PORT PLACEMENT/POSSIBLE AXILLARY DISECTION;  Surgeon: Coralie Keens, MD;  Location: [place];  Service: General;  Laterality: Left;  . right breast surg     [place] Right 07/28/2012   Procedure: RIGHT SHOULDER DECOMPRESSION AND EXCISION OF CALCIFIC DEPOSIT;  Surgeon: Tobi Bastos, MD;  [facility];  Service: Orthopedics;  Laterality: Right;  . TUBAL LIGATION    . uterine ablation  02/2006    Due to heavy periods Dr. Jon Billings   . wisdom teeth extractions  yrs ago    There were no vitals filed for this visit.   Subjective Assessment - 02/18/17 1123    Subjective  Pt reports she is having pain and fullness in her left arm especially at her wrist. She has been having trouble sleeping at night.  She wants to get stronger and to get her arm "under control" She has a compression sleeve that she wears when flying but she does not have a  glove or gauntlet.  She has had about a 30 pound weight gain since surgery .     Pertinent History  Left breast cancer with lumpectomy on 08/08/2014 with re-excision 08/16/2014 with total of 6 nodes removed. She has chemotherapy 09/04/2014- 01/15/2015 and radiation 01/29/2015-03/20/2015 She has a laparoscopic cholecystectomy 12/27/2016. right rotator cuff repair 07/28/2012    Patient Stated Goals  to get her arm "under control"     Currently in Pain?  Yes    Pain Score  -- did not numerically rate    Pain Location  Arm    Pain Orientation  Left;Lower    Pain Descriptors /  Indicators  Aching pt states she recently started quilting          [facility] PT Assessment - 02/18/17 0001      Assessment   Medical Diagnosis  left breast cancer     Referring Provider  Dr.Gudena     Onset Date/Surgical Date  08/08/14    Hand Dominance  Right      Precautions   Precautions  Other (comment)      Restrictions   Weight Bearing Restrictions  No      Balance Screen   Has the patient fallen in the past 6 months  No    Has the patient had a decrease in activity level because of a fear of falling?   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       Sensation   Light Touch  Not tested      Coordination   Gross Motor Movements are Fluid and Coordinated  Yes      Posture/Postural Control   Posture/Postural Control  Postural limitations    Postural Limitations  Rounded Shoulders;Forward head      ROM / Strength   AROM / PROM / Strength  AROM;PROM;Strength      AROM   Overall AROM   Within functional limits for tasks performed    Overall AROM Comments  symmetrical scapular movement     AROM Assessment Site  Shoulder    Right/Left Shoulder  Right;Left    Right  Shoulder Flexion  175 Degrees    Right Shoulder ABduction  175 Degrees    Left Shoulder Flexion  175 Degrees    Left Shoulder ABduction  175 Degrees      Strength   Overall Strength  Within functional limits for tasks performed    Overall Strength Comments  Pt feels she is generally weak and decondtioned in upper body  Palpation   Palpation comment  visible and palpable fullness in left axiila just proximal to scar  mild tenderness to firm palpation, decreased skin excursion at axillary scar., firmness and tenderness to palpation at pec major area.         LYMPHEDEMA/ONCOLOGY QUESTIONNAIRE - 02/18/17 1158      Type   Cancer Type  left breast cancer       Surgeries   Lumpectomy Date  08/08/14    Axillary Lymph Node Dissection Date  08/16/14    Number Lymph Nodes Removed  6      Treatment   Past Chemotherapy Treatment  Yes    Date  01/15/15    Past Radiation Treatment  Yes    Date  03/20/15    Body Site  left upper quadrant     Current Hormone Treatment  Yes    Drug Name  Arimidex       What other symptoms do you have   Are you Having Heaviness or Tightness  Yes    Are you having Pain  Yes    Are you having pitting edema  Yes    Body Site  mildly in left forearm     Is it Hard or Difficult finding clothes that fit  Yes cannot wear her rings     Do you have infections  No    Is there Decreased scar mobility  Yes    Stemmer Sign  No    Other Symptoms  pt feels fullness in left back and lateral and anterior chest and axilla       Right Upper Extremity Lymphedema   10 cm Proximal to Olecranon Process  29 cm    Olecranon Process  26 cm    15 cm Proximal to Ulnar Styloid Process  26.5 cm    10 cm Proximal to Ulnar Styloid Process  24 cm    Just Proximal to Ulnar Styloid Process  17 cm    Across Hand at [employer]  19 cm    At [place] of 2nd Digit  6.5 cm      Left Upper Extremity Lymphedema   10 cm Proximal to Olecranon Process  29 cm    Olecranon Process  26.2  cm    15 cm Proximal to Ulnar Styloid Process  26.5 cm    10 cm Proximal to Ulnar Styloid Process  24 cm    Just Proximal to Ulnar Styloid Process  17.5 cm    Across Hand at [employer]  19 cm    At [place] of 2nd Digit  6.5 cm          Objective measurements completed on examination: See above findings.                PT Short Term Goals - 02/18/17 1215      PT SHORT TERM GOAL #1   Title  Pt will verbalize knowledge of lymphedema risk reduction practices     Time  4    Period  Weeks    Status  New      PT SHORT TERM GOAL #2   Title  Pt will know how to get compression garments to manage trunk and arm swelling during airplane travel     Time  4    Period  Weeks    Status  New      PT SHORT TERM GOAL #3   Title  Pt will be independent  in home exercise program for postural strengthening     Time  4    Period  Weeks    Status  New        PT Long Term Goals - 02/18/17 1217      PT LONG TERM GOAL #1   Title  Pt will report 75% improvment in her symptoms of left arm pain and swelling     Time  8    Period  Weeks    Status  New      PT LONG TERM GOAL #2   Title  Pt will be knowldegable of the use of manual lymph drainage and compression to manage symptoms     Time  8    Period  Weeks    Status  New      PT LONG TERM GOAL #3   Title  Pt will be indepenent in a progressive resistance strengthening program to decrease the risk of lymphedema exacerbations.     Time  8    Period  Weeks    Status  New             Plan - 02/18/17 1201    Clinical Impression Statement  Pt comes to PT with c/o of pain and swelling in left arm after breast cancer treatment She is concerned about lymphedema in her left arm and wants to increase her strength.  She has not exercised her upper body since treatment becasue she is afraid to.  She has a circular knit compression sleeve that she wears during frequent airplane travel for work. He circumference measurements do not  indicated lymphedema, but pt feels symptoms.  Her goal is to get her arm 'under control"  Script request sent to Dr. Lindi Adie today for [facility] bra and axillary pad, flat knit sleeve and glove     History and Personal Factors relevant to plan of care:  Past radiation and 6 axillary nodes removed. No knowledge of lymphedema risk reduction.     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 Begin and educated about self manual lymph drainage to left upper quadarant, myofascial releast to left anterior chest with scar mobilization, Use kinesiotape and chip pack for left axillary swelling  begin with Meeks decompression exercise and progress to supine scapular series and strength ABC program Monitor arm lymphedema and use compression bandaging as indicated     Recommended Other Services  ABC class     Consulted and Agree with Plan of Care  Patient       Patient will benefit from skilled therapeutic intervention in order to improve the following deficits and impairments:  Decreased endurance, Decreased knowledge of precautions, Decreased scar mobility, Increased edema, Impaired UE functional use, Increased fascial restricitons, Decreased strength, Decreased  knowledge of use of DME, Decreased activity tolerance,  Pain, Impaired perceived functional ability, Postural dysfunction  Visit Diagnosis: Lymphedema, not elsewhere classified - Plan: PT plan of care cert/re-cert  Disorder of the skin and subcutaneous tissue related to radiation, unspecified - Plan: PT plan of care cert/re-cert  Muscle weakness (generalized) - Plan: PT plan of care cert/re-cert  Abnormal posture - Plan: PT plan of care cert/re-cert     Problem List Patient Active Problem List   Diagnosis Date Noted  . Anxiety with depression 10/08/2016  . Insomnia 09/22/2016  . Bronchitis 05/26/2016  . Vitamin D deficiency 04/07/2016  . Easy bruising 03/20/2016  . 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 Anemia associated with chemotherapy 10/24/2014  . Malignant neoplasm of upper-outer quadrant of left breast in female, estrogen receptor positive ([place]) 07/31/2014  . Hyperlipidemia 09/06/2012  . Generalized anxiety disorder 09/06/2012  . Meniere's disease 09/06/2012  . Statin intolerance 09/06/2012  . Left rotator cuff tear 09/06/2012   Donato Heinz. Owens Shark PT  Norwood Levo 02/18/2017, 12:23 PM  [place], [place], [address] Phone: [phone]   Fax:  [phone]  Name: EVITA MERIDA MRN: [id number] Date of Birth: [birth year]

## 2017-02-19 ENCOUNTER — Ambulatory Visit: Payer: PRIVATE HEALTH INSURANCE | Admitting: Physical Therapy

## 2017-02-19 ENCOUNTER — Encounter: Payer: Self-pay | Admitting: Physical Therapy

## 2017-02-19 DIAGNOSIS — M6281 Muscle weakness (generalized): Secondary | ICD-10-CM

## 2017-02-19 DIAGNOSIS — I890 Lymphedema, not elsewhere classified: Secondary | ICD-10-CM

## 2017-02-19 DIAGNOSIS — L599 Disorder of the skin and subcutaneous tissue related to radiation, unspecified: Secondary | ICD-10-CM

## 2017-02-19 DIAGNOSIS — R293 Abnormal posture: Secondary | ICD-10-CM

## 2017-02-19 NOTE — Patient Instructions (Signed)
First of all, check with your insurance company to see if provider is in network    [facility]   (for wigs and compression sleeves / gloves/gauntlets )  [address] [place], [place] [address] [phone]  Will file some insurances --- call for appointment   [facility] (for mastectomy prosthetics and garments) [address] [place], [place] [address] [phone] Will file some insurances --- call for appointment  [facility]  [address]  [place], [place] [address] [phone] Lower extremity garments  [facility] [address] [place], [place]  [address] [phone]  [employer] Sales rep:  Matt Lawson:  [phone] [email or web address] Biocompression pumps   [employer]  Sales rep: Robert Rollins:  [phone] [email or web address] Entre and Flexitouch pumps    Other Resources: National Lymphedema Network:  www.lymphnet.org www.[email or web address] for patient articles and purchase a self manual lymph drainage DVD [email or web address] has informative articles.  

## 2017-02-19 NOTE — Therapy (Signed)
[place], [place], [address] Phone: [phone]   Fax:  [phone]  Physical Therapy Treatment  Patient Details  Name: [name] MRN: [id number] Date of Birth: [birth year] Referring Provider: Dr.Gudena    Encounter Date: 02/19/2017  PT End of Session - 02/19/17 1624    Visit Number  2    Number of Visits  17    Date for PT Re-Evaluation  04/21/16    PT Start Time  1300    PT Stop Time  1345    PT Time Calculation (min)  45 min    Activity Tolerance  Patient tolerated treatment well    Behavior During Therapy  [facility] for tasks assessed/performed       Past Medical History:  Diagnosis Date  . Abnormal Pap smear of cervix 2002   Dr. Eilleen Kempf   . Allergy 2012   seasonal  . Anxiety   . Benign hemangioma    Dr,  Marybelle Killings   . Breast cancer ([place]) 07/20/14   Left Breast  . Cervical stenosis (uterine cervix) 2002  . Depression   . Dizziness 1991   secondary to Meniere's disease  . Hyperlipidemia 2007  . Insomnia 1997  . Meniere's disease 1991  . Nervousness(799.21) 1987    Past Surgical History:  Procedure Laterality Date  . AXILLARY LYMPH NODE DISSECTION Left 08/16/2014   Procedure: AXILLARY LYMPH NODE DISSECTION;  Surgeon: Coralie Keens, MD;  Location: [place];  Service: General;  Laterality: Left;  . bilateral  tubal ligation  05/08/2004  . BREAST LUMPECTOMY WITH RADIOACTIVE SEED AND SENTINEL LYMPH NODE BIOPSY Left 08/08/2014   Procedure: BREAST LUMPECTOMY WITH RADIOACTIVE SEED AND SENTINEL LYMPH NODE BIOPSY;  Surgeon: Coralie Keens, MD;  Location: [place];  Service: General;  Laterality: Left;  . CHOLECYSTECTOMY  12/30/2016  . MASTOPEXY Right 12/14/2015   Procedure: RIGHT BREAST MASTOPEXY;  Surgeon: Irene Limbo, MD;  Location: [place];  Service: Plastics;  Laterality: Right;  . PORT-A-CATH REMOVAL N/A 03/08/2015   Procedure: REMOVAL PORT-A-CATH;  Surgeon: Coralie Keens, MD;  [facility];  Service: General;  Laterality: N/A;  . PORTACATH PLACEMENT Right 08/16/2014   Procedure: INSERTION PORT-A-CATH;  Surgeon: Coralie Keens, MD;  Location: [place];  Service: General;  Laterality: Right;  . RE-EXCISION OF BREAST LUMPECTOMY Left 08/16/2014   Procedure: RE-EXCISION OF LEFT BREAST CANCER AND PORT PLACEMENT/POSSIBLE AXILLARY DISECTION;  Surgeon: Coralie Keens, MD;  Location: [place];  Service: General;  Laterality: Left;  . right breast surg     [place] Right 07/28/2012   Procedure: RIGHT SHOULDER DECOMPRESSION AND EXCISION OF CALCIFIC DEPOSIT;  Surgeon: Tobi Bastos, MD;  [facility];  Service: Orthopedics;  Laterality: Right;  . TUBAL LIGATION    . uterine ablation  02/2006    Due to heavy periods Dr. Jon Billings   . wisdom teeth extractions  yrs ago    There were no vitals filed for this visit.  Subjective Assessment - 02/19/17 1307    Subjective  Pt report she got some relief from minimal treament during eval yesterday . She said that she was able to move her arm much easier with less pain and that her husband stated her chest appeared less "puffy"     Pertinent History  Left breast cancer with lumpectomy on 08/08/2014 with re-excision 08/16/2014 with total of 6 nodes removed.  She has chemotherapy 09/04/2014- 01/15/2015 and radiation 01/29/2015-03/20/2015 She has a laparoscopic cholecystectomy 12/27/2016. right rotator cuff repair 07/28/2012    Patient Stated Goals  to get her arm "under control"     Currently in Pain?  Yes    Pain Score  -- "a little bit"     Pain Location  Arm    Pain Orientation  Lower;Left    Pain Descriptors / Indicators  Aching            LYMPHEDEMA/ONCOLOGY QUESTIONNAIRE - 02/18/17 1158      Type   Cancer Type  left breast cancer       Surgeries   Lumpectomy Date  08/08/14     Axillary Lymph Node Dissection Date  08/16/14    Number Lymph Nodes Removed  6      Treatment   Past Chemotherapy Treatment  Yes    Date  01/15/15    Past Radiation Treatment  Yes    Date  03/20/15    Body Site  left upper quadrant     Current Hormone Treatment  Yes    Drug Name  Arimidex       What other symptoms do you have   Are you Having Heaviness or Tightness  Yes    Are you having Pain  Yes    Are you having pitting edema  Yes    Body Site  mildly in left forearm     Is it Hard or Difficult finding clothes that fit  Yes cannot wear her rings     Do you have infections  No    Is there Decreased scar mobility  Yes    Stemmer Sign  No    Other Symptoms  pt feels fullness in left back and lateral and anterior chest and axilla       Right Upper Extremity Lymphedema   10 cm Proximal to Olecranon Process  29 cm    Olecranon Process  26 cm    15 cm Proximal to Ulnar Styloid Process  26.5 cm    10 cm Proximal to Ulnar Styloid Process  24 cm    Just Proximal to Ulnar Styloid Process  17 cm    Across Hand at [employer]  19 cm    At [place] of 2nd Digit  6.5 cm      Left Upper Extremity Lymphedema   10 cm Proximal to Olecranon Process  29 cm    Olecranon Process  26.2 cm    15 cm Proximal to Ulnar Styloid Process  26.5 cm    10 cm Proximal to Ulnar Styloid Process  24 cm    Just Proximal to Ulnar Styloid Process  17.5 cm    Across Hand at [employer]  19 cm    At [place] of 2nd Digit  6.5 cm               [facility] Adult PT Treatment/Exercise - 02/19/17 0001      Self-Care   Self-Care  Other Self-Care Comments    Other Self-Care Comments   Provided chip pack to place in bra at left anterior chest and axilla inside bra       Shoulder Exercises: Sidelying   ABduction  AROM;10 reps with stretch up at the top     Other Sidelying Exercises  horizontal abduction with posterior rotation of thoracic spine     Other Sidelying Exercises  small controlled circles with hand  pointing  to ceiling.       Manual Therapy   Manual Therapy  Manual Lymphatic Drainage (MLD)    Manual therapy comments  educated in basic anatomy and physiology of lymph system     Manual Lymphatic Drainage (MLD)  In supine, short neck, shoulder collectors, superficial and deep abdominals, right axillary nodes, anterior interaxillary anastamosis, left inguinal nodes, left axillo-inguinal anastamosis, left anterior chest, axilla, upper arm to hand and return along pathways.  then to sidelying for posterior interaxillary anastamosis, and lateral chest and back                PT Short Term Goals - 02/18/17 1215      PT SHORT TERM GOAL #1   Title  Pt will verbalize knowledge of lymphedema risk reduction practices     Time  4    Period  Weeks    Status  New      PT SHORT TERM GOAL #2   Title  Pt will know how to get compression garments to manage trunk and arm swelling during airplane travel     Time  4    Period  Weeks    Status  New      PT SHORT TERM GOAL #3   Title  Pt will be independent in home exercise program for postural strengthening     Time  4    Period  Weeks    Status  New        PT Long Term Goals - 02/18/17 1217      PT LONG TERM GOAL #1   Title  Pt will report 75% improvment in her symptoms of left arm pain and swelling     Time  8    Period  Weeks    Status  New      PT LONG TERM GOAL #2   Title  Pt will be knowldegable of the use of manual lymph drainage and compression to manage symptoms     Time  8    Period  Weeks    Status  New      PT LONG TERM GOAL #3   Title  Pt will be indepenent in a progressive resistance strengthening program to decrease the risk of lymphedema exacerbations.     Time  8    Period  Weeks    Status  New            Plan - 02/19/17 1625    Clinical Impression Statement  Pt is doing well with initial treatment.  Received script from Dr. 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  Continue with  self manual lymph drainage to left upper quadarant, myofascial releast to left anterior chest with scar mobilization, Use kinesiotapeif needed   begin with Meeks decompression exercise and progress to supine scapular series and strength ABC program Monitor arm lymphedema and use compression bandaging as indicated  Consulted and Agree with Plan of Care  Patient       Patient will benefit from skilled therapeutic intervention in order to improve the following deficits and impairments:  Decreased endurance, Decreased knowledge of precautions, Decreased scar mobility, Increased edema, Impaired UE functional use, Increased fascial restricitons, Decreased strength, Decreased knowledge of use of DME, Decreased activity tolerance, Pain, Impaired perceived functional ability, Postural dysfunction  Visit Diagnosis: Lymphedema, not elsewhere classified  Disorder of the skin and subcutaneous tissue related to radiation, unspecified  Muscle weakness (generalized)  Abnormal posture     Problem List Patient Active Problem List   Diagnosis Date Noted  . Anxiety with depression 10/08/2016  . Insomnia 09/22/2016  . Bronchitis 05/26/2016  . Vitamin D deficiency 04/07/2016  . Easy bruising 03/20/2016  . 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 Anemia associated with chemotherapy 10/24/2014  . Malignant neoplasm of upper-outer quadrant  of left breast in female, estrogen receptor positive ([place]) 07/31/2014  . Hyperlipidemia 09/06/2012  . Generalized anxiety disorder 09/06/2012  . Meniere's disease 09/06/2012  . Statin intolerance 09/06/2012  . Left rotator cuff tear 09/06/2012   Donato Heinz. Owens Shark PT  Norwood Levo 02/19/2017, 4:29 PM  [place], [place], [address] Phone: [phone]   Fax:  [phone]  Name: [name] MRN: [id number] Date of Birth: 11-27-1960

## 2017-02-24 ENCOUNTER — Telehealth: Payer: Self-pay | Admitting: Physical Therapy

## 2017-02-24 ENCOUNTER — Ambulatory Visit: Payer: PRIVATE HEALTH INSURANCE | Admitting: Physical Therapy

## 2017-02-24 NOTE — Telephone Encounter (Signed)
Called pt as she did not show up for appointment at 9:30.  She had it on her computer print out that appointment was at 11:00 I already have another patient scheduled at that time so will not be able to see her.  Talked with patient to verify future appointments and she corrected to match her schedule with my computer list.  Faxed script to [facility] and she will call to reschedule her appointment with them.   Maudry Diego, PT 02/24/2017 @ 10:09 AM

## 2017-03-05 ENCOUNTER — Telehealth: Payer: Self-pay

## 2017-03-05 NOTE — Telephone Encounter (Signed)
Spoke with patient as a reminder of her appts with lab and Dr Lindi Adie and to bring in her [place] diaries.Marland Kitchen

## 2017-03-06 ENCOUNTER — Encounter: Payer: Self-pay | Admitting: *Deleted

## 2017-03-06 ENCOUNTER — Other Ambulatory Visit: Payer: Self-pay | Admitting: Hematology and Oncology

## 2017-03-06 ENCOUNTER — Ambulatory Visit: Payer: PRIVATE HEALTH INSURANCE | Admitting: Physical Therapy

## 2017-03-09 ENCOUNTER — Other Ambulatory Visit (HOSPITAL_BASED_OUTPATIENT_CLINIC_OR_DEPARTMENT_OTHER): Payer: PRIVATE HEALTH INSURANCE

## 2017-03-09 ENCOUNTER — Ambulatory Visit: Payer: PRIVATE HEALTH INSURANCE | Admitting: Physical Therapy

## 2017-03-09 ENCOUNTER — Encounter: Payer: PRIVATE HEALTH INSURANCE | Admitting: *Deleted

## 2017-03-09 ENCOUNTER — Ambulatory Visit (HOSPITAL_BASED_OUTPATIENT_CLINIC_OR_DEPARTMENT_OTHER): Payer: PRIVATE HEALTH INSURANCE | Admitting: Hematology and Oncology

## 2017-03-09 ENCOUNTER — Telehealth: Payer: Self-pay | Admitting: Hematology and Oncology

## 2017-03-09 ENCOUNTER — Encounter: Payer: Self-pay | Admitting: Physical Therapy

## 2017-03-09 DIAGNOSIS — C50412 Malignant neoplasm of upper-outer quadrant of left female breast: Secondary | ICD-10-CM | POA: Diagnosis not present

## 2017-03-09 DIAGNOSIS — I890 Lymphedema, not elsewhere classified: Secondary | ICD-10-CM | POA: Diagnosis not present

## 2017-03-09 DIAGNOSIS — Z79811 Long term (current) use of aromatase inhibitors: Secondary | ICD-10-CM

## 2017-03-09 DIAGNOSIS — Z9221 Personal history of antineoplastic chemotherapy: Secondary | ICD-10-CM | POA: Diagnosis not present

## 2017-03-09 DIAGNOSIS — Z170 Estrogen receptor positive status [ER+]: Secondary | ICD-10-CM

## 2017-03-09 DIAGNOSIS — Z7982 Long term (current) use of aspirin: Secondary | ICD-10-CM

## 2017-03-09 DIAGNOSIS — Z923 Personal history of irradiation: Secondary | ICD-10-CM | POA: Diagnosis not present

## 2017-03-09 DIAGNOSIS — M8580 Other specified disorders of bone density and structure, unspecified site: Secondary | ICD-10-CM

## 2017-03-09 DIAGNOSIS — M6281 Muscle weakness (generalized): Secondary | ICD-10-CM

## 2017-03-09 DIAGNOSIS — R293 Abnormal posture: Secondary | ICD-10-CM

## 2017-03-09 DIAGNOSIS — Z006 Encounter for examination for normal comparison and control in clinical research program: Secondary | ICD-10-CM | POA: Diagnosis not present

## 2017-03-09 DIAGNOSIS — F064 Anxiety disorder due to known physiological condition: Secondary | ICD-10-CM | POA: Diagnosis not present

## 2017-03-09 DIAGNOSIS — L599 Disorder of the skin and subcutaneous tissue related to radiation, unspecified: Secondary | ICD-10-CM

## 2017-03-09 LAB — CBC WITH DIFFERENTIAL/PLATELET
BASO%: 0.4 % (ref 0.0–2.0)
BASOS ABS: 0 10*3/uL (ref 0.0–0.1)
EOS ABS: 0.1 10*3/uL (ref 0.0–0.5)
EOS%: 1.4 % (ref 0.0–7.0)
HCT: 39.9 % (ref 34.8–46.6)
HEMOGLOBIN: 13.3 g/dL (ref 11.6–15.9)
LYMPH%: 41.5 % (ref 14.0–49.7)
MCH: 32.3 pg (ref 25.1–34.0)
MCHC: 33.3 g/dL (ref 31.5–36.0)
MCV: 96.8 fL (ref 79.5–101.0)
MONO#: 0.5 10*3/uL (ref 0.1–0.9)
MONO%: 8.6 % (ref 0.0–14.0)
NEUT%: 48.1 % (ref 38.4–76.8)
NEUTROS ABS: 2.7 10*3/uL (ref 1.5–6.5)
PLATELETS: 146 10*3/uL (ref 145–400)
RBC: 4.12 10*6/uL (ref 3.70–5.45)
RDW: 13 % (ref 11.2–14.5)
WBC: 5.7 10*3/uL (ref 3.9–10.3)
lymph#: 2.4 10*3/uL (ref 0.9–3.3)

## 2017-03-09 LAB — COMPREHENSIVE METABOLIC PANEL
ALT: 24 U/L (ref 0–55)
ANION GAP: 7 meq/L (ref 3–11)
AST: 19 U/L (ref 5–34)
Albumin: 4.1 g/dL (ref 3.5–5.0)
Alkaline Phosphatase: 81 U/L (ref 40–150)
BUN: 11.4 mg/dL (ref 7.0–26.0)
CHLORIDE: 107 meq/L (ref 98–109)
CO2: 28 meq/L (ref 22–29)
Calcium: 8.7 mg/dL (ref 8.4–10.4)
Creatinine: 0.6 mg/dL (ref 0.6–1.1)
Glucose: 102 mg/dl (ref 70–140)
Potassium: 3.7 mEq/L (ref 3.5–5.1)
Sodium: 142 mEq/L (ref 136–145)
Total Bilirubin: 0.46 mg/dL (ref 0.20–1.20)
Total Protein: 6.8 g/dL (ref 6.4–8.3)

## 2017-03-09 MED ORDER — INV-ASPIRIN/PLACEBO 100 MG TABS ALLIANCE A011502: 1.0000 | ORAL_TABLET | Freq: Every day | ORAL | 0 refills | Status: DC

## 2017-03-09 NOTE — Therapy (Signed)
[place], [place], [address] Phone: [phone]   Fax:  [phone]  Physical Therapy Treatment  Patient Details  Name: [name] MRN: [id number] Date of Birth: [birth year] Referring Provider: Dr.Gudena    Encounter Date: 03/09/2017  PT End of Session - 03/09/17 1121    Visit Number  3    Number of Visits  17    Date for PT Re-Evaluation  04/21/16    PT Start Time  1025    PT Stop Time  1110    PT Time Calculation (min)  45 min    Activity Tolerance  Patient tolerated treatment well    Behavior During Therapy  [facility] for tasks assessed/performed       Past Medical History:  Diagnosis Date  . Abnormal Pap smear of cervix 2002   Dr. Eilleen Kempf   . Allergy 2012   seasonal  . Anxiety   . Benign hemangioma    Dr,  Marybelle Killings   . Breast cancer ([place]) 07/20/14   Left Breast  . Cervical stenosis (uterine cervix) 2002  . Depression   . Dizziness 1991   secondary to Meniere's disease  . Hyperlipidemia 2007  . Insomnia 1997  . Meniere's disease 1991  . Nervousness(799.21) 1987    Past Surgical History:  Procedure Laterality Date  . AXILLARY LYMPH NODE DISSECTION Left 08/16/2014   Procedure: AXILLARY LYMPH NODE DISSECTION;  Surgeon: Coralie Keens, MD;  Location: [place];  Service: General;  Laterality: Left;  . bilateral  tubal ligation  05/08/2004  . BREAST LUMPECTOMY WITH RADIOACTIVE SEED AND SENTINEL LYMPH NODE BIOPSY Left 08/08/2014   Procedure: BREAST LUMPECTOMY WITH RADIOACTIVE SEED AND SENTINEL LYMPH NODE BIOPSY;  Surgeon: Coralie Keens, MD;  Location: [facility];  Service: General;  Laterality: Left;  . CHOLECYSTECTOMY  12/30/2016  . MASTOPEXY Right 12/14/2015   Procedure: RIGHT BREAST MASTOPEXY;  Surgeon: Irene Limbo, MD;  Location: [place];  Service: Plastics;  Laterality: Right;  . PORT-A-CATH REMOVAL N/A 03/08/2015    Procedure: REMOVAL PORT-A-CATH;  Surgeon: Coralie Keens, MD;  Location: [facility];  Service: General;  Laterality: N/A;  . PORTACATH PLACEMENT Right 08/16/2014   Procedure: INSERTION PORT-A-CATH;  Surgeon: Coralie Keens, MD;  Location: [facility];  Service: General;  Laterality: Right;  . RE-EXCISION OF BREAST LUMPECTOMY Left 08/16/2014   Procedure: RE-EXCISION OF LEFT BREAST CANCER AND PORT PLACEMENT/POSSIBLE AXILLARY DISECTION;  Surgeon: Coralie Keens, MD;  Location: [facility];  Service: General;  Laterality: Left;  . right breast surg     [place] Right 07/28/2012   Procedure: RIGHT SHOULDER DECOMPRESSION AND EXCISION OF CALCIFIC DEPOSIT;  Surgeon: Tobi Bastos, MD;  Location: [facility];  Service: Orthopedics;  Laterality: Right;  . TUBAL LIGATION    . uterine ablation  02/2006    Due to heavy periods Dr. Jon Billings   . wisdom teeth extractions  yrs ago    There were no vitals filed for this visit.  Subjective Assessment - 03/09/17 1116    Subjective  "Its back"  Pt reports she feel pain and soreness in her left forearm and she has visible fullness in her left axilla and breast. She has not gotten her compression bra or sleeve yet     Pertinent History  Left breast cancer with lumpectomy on 08/08/2014 with re-excision 08/16/2014 with total of 6 nodes removed. She has  chemotherapy 09/04/2014- 01/15/2015 and radiation 01/29/2015-03/20/2015 She has a laparoscopic cholecystectomy 12/27/2016. right rotator cuff repair 07/28/2012    Patient Stated Goals  to get her arm "under control"     Currently in Pain?  Yes    Pain Score  -- did not rate     Pain Location  Arm    Pain Orientation  Lower;Left    Pain Descriptors / Indicators  Aching            LYMPHEDEMA/ONCOLOGY QUESTIONNAIRE - 03/09/17 1031      Left Upper Extremity Lymphedema   10 cm Proximal to Olecranon Process  29 cm    Olecranon Process  26 cm    15 cm  Proximal to Ulnar Styloid Process  26 cm    10 cm Proximal to Ulnar Styloid Process  24 cm    Just Proximal to Ulnar Styloid Process  17 cm    Across Hand at [employer]  18 cm    At [place] of 2nd Digit  6.3 cm               [facility] Adult PT Treatment/Exercise - 03/09/17 0001      Manual Therapy   Manual Therapy  Edema management;Myofascial release;Passive ROM;Taping    Manual therapy comments  Remeasured arm circumference     Edema Management  small tg soft placed on left forearm with deep fold a the top.     Myofascial Release  to anterior chest     Manual Lymphatic Drainage (MLD)  In supine, short neck, shoulder collectors, superficial and deep abdominals, right axillary nodes, anterior interaxillary anastamosis, left inguinal nodes, left axillo-inguinal anastamosis, left anterior chest, axilla, upper arm to hand and return along pathways.  then to sidelying for posterior interaxillary anastamosis, and lateral chest and back     Kinesiotex  Edema      Kinesiotix   Edema  4 pronged fan shaped from left groin to full areas at left breat and axilla              PT Education - 03/09/17 1121    Education provided  Yes    Education Details  removal of kinesiotapy     Person(s) Educated  Patient    Methods  Explanation;Handout    Comprehension  Verbalized understanding       PT Short Term Goals - 02/18/17 1215      PT SHORT TERM GOAL #1   Title  Pt will verbalize knowledge of lymphedema risk reduction practices     Time  4    Period  Weeks    Status  New      PT SHORT TERM GOAL #2   Title  Pt will know how to get compression garments to manage trunk and arm swelling during airplane travel     Time  4    Period  Weeks    Status  New      PT SHORT TERM GOAL #3   Title  Pt will be independent in home exercise program for postural strengthening     Time  4    Period  Weeks    Status  New        PT Long Term Goals - 02/18/17 1217      PT LONG TERM GOAL #1    Title  Pt will report 75% improvment in her symptoms of left arm pain and swelling     Time  8  Period  Weeks    Status  New      PT LONG TERM GOAL #2   Title  Pt will be knowldegable of the use of manual lymph drainage and compression to manage symptoms     Time  8    Period  Weeks    Status  New      PT LONG TERM GOAL #3   Title  Pt will be indepenent in a progressive resistance strengthening program to decrease the risk of lymphedema exacerbations.     Time  8    Period  Weeks    Status  New            Plan - 03/09/17 1122    Clinical Impression Statement  Pt has had exacerbation of symptoms. There is no increase in left arm circumference measurement, but pt has subjective symptoms.  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     PT Frequency  2x / week    PT Duration  8 weeks    PT Treatment/Interventions  Manual lymph drainage;ADLs/Self Care Home Management;Compression bandaging;Scar mobilization;Passive range of motion;Taping;Manual techniques;Therapeutic exercise;Therapeutic activities;Orthotic Fit/Training;DME Instruction    PT Next Visit Plan  Assess benefit of Tg soft and kinesiotape   Continue with  self manual lymph drainage to left upper quadarant, myofascial releast to left anterior chest with scar mobilization, Use kinesiotapeif needed   begin with Meeks decompression exercise and progress to supine scapular series and strength ABC program Monitor arm lymphedema and use compression bandaging as indicated        Patient will benefit from skilled therapeutic intervention in order to improve the following deficits and impairments:  Decreased endurance, Decreased knowledge of  precautions, Decreased scar mobility, Increased edema, Impaired UE functional use, Increased fascial restricitons, Decreased strength, Decreased knowledge of use of DME, Decreased activity tolerance, Pain, Impaired perceived functional ability, Postural dysfunction  Visit Diagnosis: Lymphedema, not elsewhere classified  Disorder of the skin and subcutaneous tissue related to radiation, unspecified  Muscle weakness (generalized)  Abnormal posture     Problem List Patient Active Problem List   Diagnosis Date Noted  . Anxiety with depression 10/08/2016  . Insomnia 09/22/2016  . Bronchitis 05/26/2016  . Vitamin D deficiency 04/07/2016  . Easy bruising 03/20/2016  . 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 Anemia associated with chemotherapy 10/24/2014  . Malignant neoplasm of upper-outer quadrant of left breast in female, estrogen receptor positive ([place]) 07/31/2014  . Hyperlipidemia 09/06/2012  . Generalized anxiety disorder 09/06/2012  . Meniere's disease 09/06/2012  . Statin intolerance 09/06/2012  . Left rotator cuff tear 09/06/2012   Donato Heinz. Owens Shark PT  Norwood Levo 03/09/2017, 11:26 AM  [place], [place], [address] Phone: [phone]   Fax:  [phone]  Name: SAMAH LAPIANA MRN: [id number] Date of Birth: 02-May-1960

## 2017-03-09 NOTE — Assessment & Plan Note (Signed)
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 Adjuvant chemotherapy with dose dense Adriamycin and Cytoxan x 4 followed by Abraxane weekly 12 because of lymph node positive disease. Started 09/04/14 to 01/15/15 2. adjuvant radiation completed 03/20/15 3. Followed by antiestrogen therapy started 04/03/15 -------------------------------------------------------------------------------------------------------------------------- Anastrozole toxicities: Patient denies any hot flashes. 1. Complains of occasional myalgias 2. Weight gain: based on the BMI she is overweight.  Bone density 07/26/2015: T score -1.9, osteopenia  PALLAS Trial : Patient has been randomized to anastrozole alone. ABC clinical trial: The trial randomizes between aspirin versus placebo. She went on trial 07/26/2015 Currently on 100 mg of dosage. Bruising has become markedly reduced.  Adverse effects: 1. Bruising/ Hematoma: Resolved after dose reduction. She is currently going to be on 100 mg of aspirin/placebo 2. bleeding from mucous membranes especially GI, vaginal, nose, gums, nailbeds: Resolved  Cognitive dysfunction: OnAricept 10 mg daily. Memory function has improvedsignificantly  Weight gain:On Weight Watchers, now doing Keto diet  Breast cancer surveillance: 1. Mammogram done 07/23/2016 showed asymmetry right breast which was evaluated by ultrasound which did not show any abnormality 2. Breast exam 09/22/2016: No palpable lumps or nodules of concern. 3. PET/CT 11/26/2016:Small Left sided level 2 LN with  hypermetabolism, post surg changes in breast and small right groin lymph node likely infectious/inflammatory, gallstones Return to clinic in 6 months for follow-up  Plan: [facility] evaluation for [facility]: Laparoscopic cholecystectomy 01/05/2017 and lymph node biopsy: Reactive  Return to clinic

## 2017-03-09 NOTE — Patient Instructions (Signed)
Kinesiotape should stay on your body for a few days.  It's OK to shower with it on.  Just pat it dry afterwards.    While the tape is on, keep an eye on the skin around it.  If you feel any itching, see any redness or feel in any way that the tape is irritating your skin, it is time to gently remove it.   To loosen the tape from your skin, use something oily like olive oil or baby oil on a cotton ball.  Gently roll the edge of the tape away from the skin.  Then, hold the edge of the tape away from the skin and gently press the skin away from the tape.  Do not pull the tape off the skin or you may cause skin irritation.  Then, inspect your skin.  Gently cleanse with soap and water

## 2017-03-09 NOTE — Progress Notes (Signed)
Patient Care Team: Chipper Herb, MD as PCP - General (Family Medicine) Benson Norway, RN as Registered Nurse (Oncology) Nicholas Lose, MD as Consulting Physician (Hematology and Oncology) Coralie Keens, MD as Consulting Physician (General Surgery) Kyung Rudd, MD as Consulting Physician (Radiation Oncology) Sylvan Cheese, NP as Nurse Practitioner (Hematology and Oncology)  DIAGNOSIS:  Encounter Diagnosis  Name Primary?  . Malignant neoplasm of upper-outer quadrant of left breast in female, estrogen receptor positive ([place])     SUMMARY OF ONCOLOGIC HISTORY:   Malignant neoplasm of upper-outer quadrant of left breast in female, estrogen receptor positive ([place])   07/18/2014 Mammogram    Distortion left breast, breast density category C; U/S 1.3 x 0.8 x 0.8 cm left breast mass at 2:00 position 4 cm from nipple, no lymph nodes      07/20/2014 Initial Diagnosis    Left breast Biopsy: Invasive lobular cancer with LCIS, Grade 1, ER 86%, PR 78%, Her 2 Neg Ratio 1.77, Ki 67: 14%      08/01/2014 Breast MRI    Breast MRI showed non-mass enhancement 3.9 cm, no lymph nodes      08/01/2014 Clinical Stage    Stage IIA: T2 N0      08/08/2014 Surgery    Left breast lumpectomy: 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, with LCIS, medial and inferior margins positive, 3/4 lymph nodes positive      08/08/2014 Pathologic Stage    Stage IIB: T2 N1c M0      08/16/2014 Surgery    Left breast medial margin reexcision residual ILC 0.2 cm; inferior margin residual foci less than 0.2 cm, 1/5 lymph nodes positive, 1 lymph node with isolated tumor cells (Overall 4/10)      09/04/2014 - 01/15/2015 Chemotherapy    Adjuvant chemotherapy with dose dense Adriamycin and Cytoxan 4 followed by Abraxane weekly 8 ( stopped early for profound neutropenia and thrombocytopenia)      01/29/2015 - 03/20/2015 Radiation Therapy    Adjuvant XRT [facility]): 50.4 Gy over 28 fractions; seroma boost: 10 Gy  over 5 fractions. Total dose: 60.4 Gy      04/11/2015 -  Anti-estrogen oral therapy    Anastrozole 1 mg daily (PALLAS clinical trial 05/24/2015 patient was randomized to hormone therapy alone)      05/24/2015 Survivorship    Survivorship visit completed and copy of care plan given to patient      11/26/2016 PET scan    Small Left sided level 2 LN with hypermetabolism, post surg changes in breast and small right groin lymph node likely infectious/inflammatory, gallstones       CHIEF COMPLIANT: Surveillance of breast cancer, follow-up on clinical trials  INTERVAL HISTORY: [name] is a 56 year old with above-mentioned history of left breast cancer treated with lumpectomy followed by adjuvant chemotherapy and radiation is currently on anastrozole.  She is currently on PALLAS clinical trial.  She is tolerating anastrozole fairly well.  She does not have any major problems with hot flashes or myalgias.  She is also on ABC trial and has had bruising related to aspirin. She had a cholecystectomy and lymph node resection at [facility].  The abdominal pain has completely resolved.  Lymph node in the neck came back as benign reactive lymph node.  She is very concerned about her weight issues but overall she appears to be feeling quite well.  REVIEW OF SYSTEMS:   Constitutional: Denies fevers, chills or abnormal weight loss Eyes: Denies blurriness of  vision Ears, nose, mouth, throat, and face: Denies mucositis or sore throat Respiratory: Denies cough, dyspnea or wheezes Cardiovascular: Denies palpitation, chest discomfort Gastrointestinal:  Denies nausea, heartburn or change in bowel habits Skin: Denies abnormal skin rashes Lymphatics: Denies new lymphadenopathy or easy bruising Neurological:Denies numbness, tingling or new weaknesses Behavioral/Psych: Mood is stable, no new changes  Extremities: No lower extremity edema  All other systems were reviewed with the patient and  are negative.  I have reviewed the past medical history, past surgical history, social history and family history with the patient and they are unchanged from previous note.  ALLERGIES:  is allergic to statins; crestor [rosuvastatin]; lipitor [atorvastatin]; and temazepam.  MEDICATIONS:  Current Outpatient Medications  Medication Sig Dispense Refill  . acetaminophen (TYLENOL) 325 MG tablet Take 975 mg by mouth daily as needed for moderate pain. Usually takes twice daily in morning and bedtime and additionally during the day if needed for pain    . anastrozole (ARIMIDEX) 1 MG tablet TAKE 1 TABLET EVERY DAY 90 tablet 2  . Biotin 1000 MCG tablet Take 1,000 mcg by mouth daily.    . clonazePAM (KLONOPIN) 0.5 MG tablet Take 1 tablet (0.5 mg total) by mouth 3 (three) times daily as needed. 90 tablet 5  . donepezil (ARICEPT) 10 MG tablet TAKE 1 TABLET (10 MG TOTAL) BY MOUTH AT BEDTIME. 90 tablet 3  . Investigational aspirin/placebo 100 MG tablet Alliance [id number] Take 1 tablet by mouth daily. Take with food or a full glass of water. Do not crush enteric coated tablets. 180 tablet 0  . [START ON 06/20/2017] Investigational aspirin/placebo 100 MG tablet Alliance [id number] Take 1 tablet by mouth daily. Take with food or a full glass of water. Do not crush enteric coated tablets. 90 tablet 0  . LORazepam (ATIVAN) 1 MG tablet TAKE 1 TABLET BY MOUTH AT BEDTIME 30 tablet 0  . PARoxetine (PAXIL-CR) 25 MG 24 hr tablet Take 1 tablet (25 mg total) by mouth daily. 90 tablet 3  . rosuvastatin (CRESTOR) 10 MG tablet Take 1 tablet (10 mg total) by mouth daily. 90 tablet 3  . Turmeric 500 MG TABS Take 1 tablet by mouth daily.    . Vitamin D, Ergocalciferol, (DRISDOL) 50000 units CAPS capsule TAKE 1 CAPSULE (50,000 UNITS TOTAL) BY MOUTH EVERY 7 (SEVEN) DAYS. 12 capsule 3   No current facility-administered medications for this visit.     PHYSICAL EXAMINATION: ECOG PERFORMANCE STATUS: 1 - Symptomatic but completely  ambulatory  Vitals:   03/09/17 0821  BP: (!) 146/83  Pulse: 71  Resp: 18  Temp: 98.4 F (36.9 C)  SpO2: 94%   Filed Weights   03/09/17 0821  Weight: 165 lb 6.4 oz (75 kg)    GENERAL:alert, no distress and comfortable SKIN: skin color, texture, turgor are normal, no rashes or significant lesions EYES: normal, Conjunctiva are pink and non-injected, sclera clear OROPHARYNX:no exudate, no erythema and lips, buccal mucosa, and tongue normal  NECK: supple, thyroid normal size, non-tender, without nodularity LYMPH:  no palpable lymphadenopathy in the cervical, axillary or inguinal LUNGS: clear to auscultation and percussion with normal breathing effort HEART: regular rate & rhythm and no murmurs and no lower extremity edema ABDOMEN:abdomen soft, non-tender and normal bowel sounds MUSCULOSKELETAL:no cyanosis of digits and no clubbing  NEURO: alert & oriented x 3 with fluent speech, no focal motor/sensory deficits EXTREMITIES: No lower extremity edema   LABORATORY DATA:  I have reviewed the data as listed   Chemistry  Component Value Date/Time   NA 142 03/09/2017 0804   K 3.7 03/09/2017 0804   CL 99 (L) 09/09/2014 2336   CO2 28 03/09/2017 0804   BUN 11.4 03/09/2017 0804   CREATININE 0.6 03/09/2017 0804      Component Value Date/Time   CALCIUM 8.7 03/09/2017 0804   ALKPHOS 81 03/09/2017 0804   AST 19 03/09/2017 0804   ALT 24 03/09/2017 0804   BILITOT 0.46 03/09/2017 0804       Lab Results  Component Value Date   WBC 5.7 03/09/2017   HGB 13.3 03/09/2017   HCT 39.9 03/09/2017   MCV 96.8 03/09/2017   PLT 146 03/09/2017   NEUTROABS 2.7 03/09/2017    ASSESSMENT & PLAN:  Malignant neoplasm of upper-outer quadrant of left breast in female, estrogen receptor positive ([place]) Left breast lumpectomy 08/08/2014: 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 Adjuvant chemotherapy with dose dense Adriamycin and Cytoxan x 4 followed by Abraxane weekly 12 because of lymph node positive disease. Started 09/04/14 to 01/15/15 2. adjuvant radiation completed 03/20/15 3. Followed by antiestrogen therapy started 04/03/15 -------------------------------------------------------------------------------------------------------------------------- Anastrozole toxicities: Patient denies any hot flashes. 1. Complains of occasional myalgias 2. Weight gain: based on the BMI she is overweight.  Bone density 07/26/2015: T score -1.9, osteopenia  PALLAS Trial : Patient has been randomized to anastrozole alone. ABC clinical trial: The trial randomizes between aspirin versus placebo. She went on trial 07/26/2015 Currently on 100 mg of dosage. Bruising has become markedly reduced.  Adverse effects: 1. Bruising/ Hematoma: Resolved after dose reduction. She is currently going to be on 100 mg of aspirin/placebo 2. bleeding from mucous membranes especially GI, vaginal, nose, gums, nailbeds: Resolved  Cognitive dysfunction: OnAricept 10 mg daily. Memory function has improvedsignificantly  Weight gain:On Weight Watchers, now doing Keto diet  Breast cancer surveillance: 1. Mammogram done 07/23/2016 showed asymmetry right breast which was evaluated by ultrasound which did not show any abnormality 2. Breast exam 09/22/2016: No palpable lumps or nodules of concern. 3. PET/CT 11/26/2016:Small Left sided level 2 LN with hypermetabolism, post surg changes in breast and small right groin lymph node likely infectious/inflammatory, gallstones Return to clinic in 6 months for follow-up  Plan: Surgical evaluation  for [facility]: Laparoscopic cholecystectomy 01/05/2017 and lymph node biopsy: Reactive Patient is extremely anxious about risk of recurrence.  2 of her friends have recently died from recurrence of breast cancers.  Return to clinic in April for follow-up and after that can see her once every 6 months.  I spent 25 minutes talking to the patient of which more than half was spent in counseling and coordination of care.  No orders of the defined types were placed in this encounter.  The patient has a good understanding of the overall plan. she agrees with it. she will call with any problems that may develop before the next visit here.   Harriette Ohara, MD 03/09/17

## 2017-03-09 NOTE — Telephone Encounter (Signed)
Patient scheduled per 12/31 los. Patient declined AVs and calendar. Will receive update in Mychart.

## 2017-03-09 NOTE — Progress Notes (Signed)
03/09/2017 Patient in to clinic unaccompanied today for AFT-05 PALLAS Cycle 24 visit and interim dispensing visit for the [facility] [id number] ABC study.  Based on lab results review and physical exam by Dr. Lindi Adie, patient meets criteria to continue treatment on the PALLAS and ABC trials.   AFT-05 PALLAS - Cycle 24, Day 1 Upon arrival to the clinic, PRO and Adherence Questionnaires were completed independently by the patient. Patient returned her completed anti-hormone therapy drug diary for Cycles 21 through 23, showing no missed doses of anastrozole. Patient was given drug diaries for Cycles 24, 25 and 26 of anti-hormone therapy, beginning with 03/09/2017. Instructed patient to return the completed diaries at the next study visit, to be scheduled for late April/early May 2019. Patient is aware that no additional study diaries will be required to be completed after Cycle 26, however, anti-hormone therapy with anastrozole will continue for a minimum of five years, with additional therapy and/or modifications at physician discretion. Patient is aware that the End of Treatment visit is expected to occur with 30-42 days following completion of Cycle 26, at which time study visits in follow-up are expected to occur every six months. See Adverse Event Log table below regarding AEs. Patient states that she did not take anti-nausea medication or pain medication prescribed following her surgery.   [facility] [phone] ABC - Interim dispensing visit Due to coordination with overlapping visit windows for both studies, PALLAS EOT visit and ABC Month 24 study visit are expected to occur the week of 07/06/17 (specifically between 4/29 and 07/09/17, due to central lab shipping requirements), as allowed per protocol window. However, since patient has insufficient medication supply of aspirin 100mg /placebo beyond 06/19/2017, an additional bottle of 90 tablets were dispensed to patient today to avoid missed doses.  Patient brought  with her one previously dispensed medication bottle containing #12 aspirin 100mg /placebo tablets from the Month 18 dispensing visit. Bottle was retained by patient for continued dosing, and she will then begin dosing from the second Month 18 bottle, currently at home. One new bottle of study medication (aspirin 100mg /placebo x 90 tablets per bottle) was dispensed by pharmacist Carolynne Edouard and was given to the patient for continued dosing through next study visit in late April/early May 2019, following completion of Month 18 bottle #2. Patient returned completed medication logs for 12/22/16 - 03/08/17 showing no missed doses. Copies were retained at the site, and the complete packet of [id number] Medication Logs were returned to the patient for continued recording daily doses of IP. [name] BSN, RN, CCRP 03/09/2017 2:26 PM  Adverse Event Log 12/22/2016 - 03/09/2017 AFT-05 PALLAS Cycles 21-23 Baseline AEs (grade): dizziness, int. (1); anxiety (2); hiccups, int. (1); lower extremity pain, int. (2) Event Grade Onset Date Resolved Date Attribution to anastrozole Attribution to aspirin/placebo Treatment Comments  Memory impairment 1 06/06/2016 ongoing No Unrelated Aricept    Musculoskeletal aches 1 01/09/2016 ongoing Yes Not reportable None Intermittent in nature  Bruising 1 04/07/2016 ongoing No Probable None    Hypertension 2 12/22/2016 ongoing No Unrelated None   Surgical site pain, int. 1 01/09/2016 ongoing No Not reportable Tylenol    Weight gain 1 12/20/2015 03/09/2017 Yes Not reportable None worsened in grade  Osteopenia 1 07/26/2015 ongoing Yes Not reportable None    Constipation, int. 1 12/2015 ongoing Yes Not reportable None    Hemorrhoids 1 12/2015 ongoing No Not reportable None    Hematoma, abd. wall 1 03/20/2016 01/08/2017 No Unlikely None Resolved after surgery, estimated date.  Arm swelling, intermittent 1 06/08/2016 12/22/2016 No Not reportable Lasix    Arm swelling 2 12/22/2016 ongoing No  Unrelated PT referral   Insomnia 2 09/07/2016 ongoing Yes Unrelated Ativan   Platelet count decreased 1 09/22/2016 03/09/2017 NCS Not reportable None   Hemorrhoidal bleeding, int. 1 10/08/2016 ongoing No Unlikely None    Vaginal bleeding, int. 1 10/08/2016 ongoing No Not reportable None    Right lower lateral chest wall pain 2 11/17/2016 02/03/2017 No Unrelated None Referral to general surgery; improved after cholecystecomy  Shortness of breath 1 11/17/2016 ongoing No Not reportable None Persistent after surgery  Gallstones 3 12/30/2016 12/30/2016 No Unrelated surgery laparascopic cholecystecomy  Weight gain 2 03/09/2017 ongoing No Possible None   Laparoscopic wound redness 2 02/03/2017 03/09/2017 No Unrelated Keflex   Left upper arm pain 1 02/18/2017 ongoing Yes Not reportable PT   Jenny Reichmann S. Brigitte Pulse BSN, RN, CCRP 04/13/2017 1:16 PM

## 2017-03-13 ENCOUNTER — Ambulatory Visit: Payer: PRIVATE HEALTH INSURANCE | Admitting: Physical Therapy

## 2017-03-17 ENCOUNTER — Encounter: Payer: PRIVATE HEALTH INSURANCE | Admitting: Physical Therapy

## 2017-03-20 ENCOUNTER — Encounter: Payer: Self-pay | Admitting: Physical Therapy

## 2017-03-20 ENCOUNTER — Ambulatory Visit: Payer: PRIVATE HEALTH INSURANCE | Attending: Hematology and Oncology | Admitting: Physical Therapy

## 2017-03-20 DIAGNOSIS — M6281 Muscle weakness (generalized): Secondary | ICD-10-CM | POA: Insufficient documentation

## 2017-03-20 DIAGNOSIS — I890 Lymphedema, not elsewhere classified: Secondary | ICD-10-CM | POA: Diagnosis present

## 2017-03-20 DIAGNOSIS — R293 Abnormal posture: Secondary | ICD-10-CM | POA: Insufficient documentation

## 2017-03-20 DIAGNOSIS — L599 Disorder of the skin and subcutaneous tissue related to radiation, unspecified: Secondary | ICD-10-CM | POA: Diagnosis present

## 2017-03-20 NOTE — Therapy (Signed)
[place], [place], [address] Phone: [phone]   Fax:  [phone]  Physical Therapy Treatment  Patient Details  Name: [name] MRN: [id number] Date of Birth: 1961-02-24 Referring Provider: Dr.Gudena    Encounter Date: 03/20/2017  PT End of Session - 03/20/17 1356    Visit Number  4    Number of Visits  17    Date for PT Re-Evaluation  04/21/16    PT Start Time  0845    PT Stop Time  0930    PT Time Calculation (min)  45 min    Activity Tolerance  Patient tolerated treatment well    Behavior During Therapy  [facility] for tasks assessed/performed       Past Medical History:  Diagnosis Date  . Abnormal Pap smear of cervix 2002   Dr. Eilleen Kempf   . Allergy 2012   seasonal  . Anxiety   . Benign hemangioma    Dr,  Marybelle Killings   . Breast cancer ([place]) 07/20/14   Left Breast  . Cervical stenosis (uterine cervix) 2002  . Depression   . Dizziness 1991   secondary to Meniere's disease  . Hyperlipidemia 2007  . Insomnia 1997  . Meniere's disease 1991  . Nervousness(799.21) 1987    Past Surgical History:  Procedure Laterality Date  . AXILLARY LYMPH NODE DISSECTION Left 08/16/2014   Procedure: AXILLARY LYMPH NODE DISSECTION;  Surgeon: Coralie Keens, MD;  Location: [place];  Service: General;  Laterality: Left;  . bilateral  tubal ligation  05/08/2004  . BREAST LUMPECTOMY WITH RADIOACTIVE SEED AND SENTINEL LYMPH NODE BIOPSY Left 08/08/2014   Procedure: BREAST LUMPECTOMY WITH RADIOACTIVE SEED AND SENTINEL LYMPH NODE BIOPSY;  Surgeon: Coralie Keens, MD;  Location: [place];  Service: General;  Laterality: Left;  . CHOLECYSTECTOMY  12/30/2016  . MASTOPEXY Right 12/14/2015   Procedure: RIGHT BREAST MASTOPEXY;  Surgeon: Irene Limbo, MD;  Location: [place];  Service: Plastics;  Laterality: Right;  . PORT-A-CATH REMOVAL N/A 03/08/2015   Procedure:  REMOVAL PORT-A-CATH;  Surgeon: Coralie Keens, MD;  Location: [facility];  Service: General;  Laterality: N/A;  . PORTACATH PLACEMENT Right 08/16/2014   Procedure: INSERTION PORT-A-CATH;  Surgeon: Coralie Keens, MD;  Location: [place];  Service: General;  Laterality: Right;  . RE-EXCISION OF BREAST LUMPECTOMY Left 08/16/2014   Procedure: RE-EXCISION OF LEFT BREAST CANCER AND PORT PLACEMENT/POSSIBLE AXILLARY DISECTION;  Surgeon: Coralie Keens, MD;  Location: [place];  Service: General;  Laterality: Left;  . right breast surg     [place] Right 07/28/2012   Procedure: RIGHT SHOULDER DECOMPRESSION AND EXCISION OF CALCIFIC DEPOSIT;  Surgeon: Tobi Bastos, MD;  Location: [facility];  Service: Orthopedics;  Laterality: Right;  . TUBAL LIGATION    . uterine ablation  02/2006    Due to heavy periods Dr. Jon Billings   . wisdom teeth extractions  yrs ago    There were no vitals filed for this visit.  Subjective Assessment - 03/20/17 0852    Subjective  Pt had to miss a few appointments because of work.  The tape helped.  The tg soft helped also  She has not been able to get her bra fitted yet     Pertinent History  Left breast cancer with lumpectomy on 08/08/2014 with re-excision 08/16/2014 with total of 6 nodes removed. She has chemotherapy 09/04/2014- 01/15/2015  and radiation 01/29/2015-03/20/2015 She has a laparoscopic cholecystectomy 12/27/2016. right rotator cuff repair 07/28/2012    Patient Stated Goals  to get her arm "under control"     Currently in Pain?  Yes    Pain Score  2     Pain Location  Arm    Pain Orientation  Left;Lower    Pain Descriptors / Indicators  Aching    Pain Type  Chronic pain                      [facility] Adult PT Treatment/Exercise - 03/20/17 0001      Manual Therapy   Manual Therapy  Edema management;Myofascial release;Passive ROM;Taping    Edema Management  small tg soft placed on left  forearm with deep fold a the top.     Myofascial Release  to anterior chest     Manual Lymphatic Drainage (MLD)  In supine, short neck, shoulder collectors, superficial and deep abdominals, right axillary nodes, anterior interaxillary anastamosis, left inguinal nodes, left axillo-inguinal anastamosis, left anterior chest, axilla, upper arm to hand and return along pathways.  then to sidelying for posterior interaxillary anastamosis, and lateral chest and back     Kinesiotex  Edema      Kinesiotix   Edema  4 pronged fan shaped from left groin to full areas at left breat and axilla                PT Short Term Goals - 02/18/17 1215      PT SHORT TERM GOAL #1   Title  Pt will verbalize knowledge of lymphedema risk reduction practices     Time  4    Period  Weeks    Status  New      PT SHORT TERM GOAL #2   Title  Pt will know how to get compression garments to manage trunk and arm swelling during airplane travel     Time  4    Period  Weeks    Status  New      PT SHORT TERM GOAL #3   Title  Pt will be independent in home exercise program for postural strengthening     Time  4    Period  Weeks    Status  New        PT Long Term Goals - 02/18/17 1217      PT LONG TERM GOAL #1   Title  Pt will report 75% improvment in her symptoms of left arm pain and swelling     Time  8    Period  Weeks    Status  New      PT LONG TERM GOAL #2   Title  Pt will be knowldegable of the use of manual lymph drainage and compression to manage symptoms     Time  8    Period  Weeks    Status  New      PT LONG TERM GOAL #3   Title  Pt will be indepenent in a progressive resistance strengthening program to decrease the risk of lymphedema exacerbations.     Time  8    Period  Weeks    Status  New            Plan - 03/20/17 1357    Clinical Impression Statement  Pt reports she always gets relief from symptoms when she leaves her, however the fullness and discomfort return. 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     PT Frequency  2x / week    PT Duration  8 weeks    PT Next Visit Plan   Assess goals  Continue with  self manual lymph drainage to left upper quadarant, myofascial releast to left anterior chest with scar mobilization, Use kinesiotapeif needed   begin with Meeks decompression exercise and progress to supine scapular series and strength ABC program Monitor arm lymphedema and use compression bandaging as indicated     Consulted and Agree with Plan of Care  Patient       Patient will benefit from skilled therapeutic intervention in order to improve the following deficits and impairments:  Decreased endurance, Decreased knowledge of precautions, Decreased scar mobility, Increased edema, Impaired UE functional use, Increased fascial restricitons, Decreased strength, Decreased knowledge of use of DME, Decreased activity tolerance, Pain, Impaired perceived functional ability, Postural dysfunction  Visit Diagnosis: Lymphedema, not elsewhere classified  Disorder of the skin and subcutaneous tissue related to radiation, unspecified  Muscle weakness (generalized)  Abnormal posture     Problem List Patient Active Problem List   Diagnosis Date Noted  . Anxiety with depression 10/08/2016  . Insomnia 09/22/2016  . Bronchitis 05/26/2016  . Vitamin D deficiency 04/07/2016  . Easy bruising 03/20/2016  . 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 Anemia associated with chemotherapy 10/24/2014  . Malignant neoplasm of upper-outer quadrant of left breast in female, estrogen receptor positive ([place]) 07/31/2014  . Hyperlipidemia 09/06/2012  . Generalized anxiety disorder 09/06/2012  . Meniere's disease 09/06/2012  . Statin intolerance 09/06/2012  . Left rotator cuff tear 09/06/2012   Donato Heinz. Owens Shark PT  Norwood Levo 03/20/2017, 2:00 PM  [place], [place], [address] Phone: [phone]   Fax:  [phone]  Name: [name] MRN: [id number] Date of Birth: 1960-03-27

## 2017-03-24 ENCOUNTER — Ambulatory Visit: Payer: PRIVATE HEALTH INSURANCE | Admitting: Physical Therapy

## 2017-03-24 DIAGNOSIS — I890 Lymphedema, not elsewhere classified: Secondary | ICD-10-CM

## 2017-03-24 DIAGNOSIS — R293 Abnormal posture: Secondary | ICD-10-CM

## 2017-03-24 DIAGNOSIS — M6281 Muscle weakness (generalized): Secondary | ICD-10-CM

## 2017-03-24 DIAGNOSIS — L599 Disorder of the skin and subcutaneous tissue related to radiation, unspecified: Secondary | ICD-10-CM

## 2017-03-24 NOTE — Patient Instructions (Signed)
1. Decompression Exercise     Cancer Rehab [phone]    Lie on back on firm surface, knees bent, feet flat, arms turned up, out to sides, backs of hands down. Time _5-15__ minutes. Surface: floor   2. Shoulder Press    Start in Decompression Exercise position. Press shoulders downward towards supporting surface. Hold __2-3__ seconds while counting out loud. Repeat _3-5___ times. Do _1-2___ times per day.   3. Head Press    Bring cervical spine (neck) into neutral position (by either tucking the chin towards the chest or tilting the chin upward). Feel weight on back of head. Press head downward into supporting surface.    Hold _2-3__ seconds. Repeat _3-5__ times. Do _1-2__ times per day.   4. Leg Lengthener    Straighten one leg. Pull toes AND forefoot toward knee, extend heel. Lengthen leg by pulling pelvis away from ribs. Hold _2-3__ seconds. Relax. Repeat __4-6__ times. Do other leg.  Surface: floor   5. Leg Press    Straighten one leg down to floor keeping leg aligned with hip. Pull toes AND forefoot toward knee; extend heel.  Press entire leg downward (as if pressing leg into sandy beach). DO NOT BEND KNEE. Hold _2-3__ seconds. Do __4-6__ times. Repeat with other leg.     Over Head Pull: Narrow and Wide Grip   Cancer Rehab [phone]   On back, knees bent, feet flat, band across thighs, elbows straight but relaxed. Pull hands apart (start). Keeping elbows straight, bring arms up and over head, hands toward floor. Keep pull steady on band. Hold momentarily. Return slowly, keeping pull steady, back to start. Then do same with a wider grip on the band (past shoulder width) Repeat _5-10__ times. Band color __yellow____   Side Pull: Double Arm   On back, knees bent, feet flat. Arms perpendicular to body, shoulder level, elbows straight but relaxed. Pull arms out to sides, elbows straight. Resistance band comes across collarbones, hands toward floor. Hold momentarily. Slowly  return to starting position. Repeat _5-10__ times. Band color _yellow____   Sword   On back, knees bent, feet flat, left hand on left hip, right hand above left. Pull right arm DIAGONALLY (hip to shoulder) across chest. Bring right arm along head toward floor. Hold momentarily. Slowly return to starting position. Repeat _5-10__ times. Do with left arm. Band color _yellow_____   Shoulder Rotation: Double Arm   On back, knees bent, feet flat, elbows tucked at sides, bent 90, hands palms up. Pull hands apart and down toward floor, keeping elbows near sides. Hold momentarily. Slowly return to starting position. Repeat _5-10__ times. Band color __yellow____

## 2017-03-24 NOTE — Therapy (Signed)
[place], [place], [address] Phone: [phone]   Fax:  [phone]  Physical Therapy Treatment  Patient Details  Name: [name] MRN: [id number] Date of Birth: 04/27/1960 Referring Provider: Dr.Gudena    Encounter Date: 03/24/2017  PT End of Session - 03/24/17 1212    Visit Number  5    Number of Visits  17    Date for PT Re-Evaluation  04/21/16    PT Start Time  1100    PT Stop Time  1150    PT Time Calculation (min)  50 min    Activity Tolerance  Patient tolerated treatment well    Behavior During Therapy  [facility] for tasks assessed/performed       Past Medical History:  Diagnosis Date  . Abnormal Pap smear of cervix 2002   Dr. Eilleen Kempf   . Allergy 2012   seasonal  . Anxiety   . Benign hemangioma    Dr,  Marybelle Killings   . Breast cancer ([place]) 07/20/14   Left Breast  . Cervical stenosis (uterine cervix) 2002  . Depression   . Dizziness 1991   secondary to Meniere's disease  . Hyperlipidemia 2007  . Insomnia 1997  . Meniere's disease 1991  . Nervousness(799.21) 1987    Past Surgical History:  Procedure Laterality Date  . AXILLARY LYMPH NODE DISSECTION Left 08/16/2014   Procedure: AXILLARY LYMPH NODE DISSECTION;  Surgeon: Coralie Keens, MD;  Location: [place];  Service: General;  Laterality: Left;  . bilateral  tubal ligation  05/08/2004  . BREAST LUMPECTOMY WITH RADIOACTIVE SEED AND SENTINEL LYMPH NODE BIOPSY Left 08/08/2014   Procedure: BREAST LUMPECTOMY WITH RADIOACTIVE SEED AND SENTINEL LYMPH NODE BIOPSY;  Surgeon: Coralie Keens, MD;  Location: [place];  Service: General;  Laterality: Left;  . CHOLECYSTECTOMY  12/30/2016  . MASTOPEXY Right 12/14/2015   Procedure: RIGHT BREAST MASTOPEXY;  Surgeon: Irene Limbo, MD;  Location: [place];  Service: Plastics;  Laterality: Right;  . PORT-A-CATH REMOVAL N/A 03/08/2015   Procedure:  REMOVAL PORT-A-CATH;  Surgeon: Coralie Keens, MD;  [facility];  Service: General;  Laterality: N/A;  . PORTACATH PLACEMENT Right 08/16/2014   Procedure: INSERTION PORT-A-CATH;  Surgeon: Coralie Keens, MD;  Location: [place];  Service: General;  Laterality: Right;  . RE-EXCISION OF BREAST LUMPECTOMY Left 08/16/2014   Procedure: RE-EXCISION OF LEFT BREAST CANCER AND PORT PLACEMENT/POSSIBLE AXILLARY DISECTION;  Surgeon: Coralie Keens, MD;  Location: [place];  Service: General;  Laterality: Left;  . right breast surg     [place] Right 07/28/2012   Procedure: RIGHT SHOULDER DECOMPRESSION AND EXCISION OF CALCIFIC DEPOSIT;  Surgeon: Tobi Bastos, MD;  [facility];  Service: Orthopedics;  Laterality: Right;  . TUBAL LIGATION    . uterine ablation  02/2006    Due to heavy periods Dr. Jon Billings   . wisdom teeth extractions  yrs ago    There were no vitals filed for this visit.  Subjective Assessment - 03/24/17 1200    Subjective  Pt got her compression bra and sleeves. Her insurance did not cover the [place] bra so she got the [place] compression vest with a lateral pad that ca be positioned up into axilla.  She also got a compression sleeve and gauntlet that do not fit well so she is going to get those adjusted.  She feels that she  is getting relief from the kinesiotape     Pertinent History  Left breast cancer with lumpectomy on 08/08/2014 with re-excision 08/16/2014 with total of 6 nodes removed. She has chemotherapy 09/04/2014- 01/15/2015 and radiation 01/29/2015-03/20/2015 She has a laparoscopic cholecystectomy 12/27/2016. right rotator cuff repair 07/28/2012    Patient Stated Goals  to get her arm "under control"     Currently in Pain?  No/denies                      [facility] Adult PT Treatment/Exercise - 03/24/17 0001      Self-Care   Other Self-Care Comments   assessed compression bra and gave  suggestions about use of lateral pad       Shoulder Exercises: Supine   Horizontal ABduction  Strengthening;Right;Left;5 reps;Theraband    Theraband Level (Shoulder Horizontal ABduction)  Level 1 (Yellow)    External Rotation  Strengthening;Right;Left;5 reps;Theraband    Theraband Level (Shoulder External Rotation)  Level 1 (Yellow)    Flexion  Strengthening;Right;Left;5 reps;Theraband wide and narrow grip     Theraband Level (Shoulder Flexion)  Level 1 (Yellow)    Other Supine Exercises  Meeks decompression exercises     Other Supine Exercises  diagonal elevation with yellow theraband 5 reps with each arm       Manual Therapy   Myofascial Release  to anterior chest     Manual Lymphatic Drainage (MLD)  In supine, short neck, shoulder collectors, superficial and deep abdominals, right axillary nodes, anterior interaxillary anastamosis, left inguinal nodes, left axillo-inguinal anastamosis, left anterior chest, axilla, upper arm to hand and return along pathways.  then to sidelying for posterior interaxillary anastamosis, and lateral chest and back       Kinesiotix   Edema  used 3 prongs on kinesiotape today form axlla to inguinal nodes and basketweave I band over puffy areas at axilla. Educated pt about where to go get kinesiotape and skin kote              PT Education - 03/24/17 1211    Education provided  Yes    Education Details  how to position lateral pad in bra and where to go buy kinesiotape . lymphedema risk reduction practices with handout from ABC class, meeks and supine scapular series     Person(s) Educated  Patient    Methods  Explanation;Handout    Comprehension  Verbalized understanding;Returned demonstration       PT Short Term Goals - 03/24/17 1119      PT SHORT TERM GOAL #1   Title  Pt will verbalize knowledge of lymphedema risk reduction practices     Period  Weeks    Status  Achieved      PT SHORT TERM GOAL #2   Title  Pt will know how to get  compression garments to manage trunk and arm swelling during airplane travel     Status  Achieved      PT SHORT TERM GOAL #3   Title  Pt will be independent in home exercise program for postural strengthening     Status  On-going        PT Long Term Goals - 03/24/17 1120      PT LONG TERM GOAL #1   Title  Pt will report 75% improvment in her symptoms of left arm pain and swelling     Baseline  1/15 pt says she has improvement especially with the tape, but it comes back  Time  8    Period  Weeks    Status  On-going      PT LONG TERM GOAL #2   Title  Pt will be knowldegable of the use of manual lymph drainage and compression to manage symptoms     Time  8    Period  Weeks    Status  On-going      PT LONG TERM GOAL #3   Title  Pt will be indepenent in a progressive resistance strengthening program to decrease the risk of lymphedema exacerbations.     Time  8    Period  Weeks    Status  On-going            Plan - 03/24/17 1213    Clinical Impression Statement  Pt feels that she feels better especially with the kinesiotape. 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     PT Frequency  2x / week    PT Duration  8 weeks    PT Treatment/Interventions  Manual lymph drainage;ADLs/Self Care Home Management;Compression bandaging;Scar mobilization;Passive range of motion;Taping;Manual techniques;Therapeutic exercise;Therapeutic activities;Orthotic Fit/Training;DME Instruction    PT Next Visit Plan  teach application of kinesiotape with use of pt phone, assess effect of compression and ktape Continue with  self manual lymph drainage to left upper quadarant, myofascial releast to left anterior chest with scar mobilization, Use kinesiotapeif needed   begin with Meeks decompression exercise and progress to supine scapular series and  strength ABC program Monitor arm lymphedema and use compression bandaging as indicated     Consulted and Agree with Plan of Care  Patient       Patient will benefit from skilled therapeutic intervention in order to improve the following deficits and impairments:  Decreased endurance, Decreased knowledge of precautions, Decreased scar mobility, Increased edema, Impaired UE functional use, Increased fascial restricitons, Decreased strength, Decreased knowledge of use of DME, Decreased activity tolerance, Pain, Impaired perceived functional ability, Postural dysfunction, Abnormal gait  Visit Diagnosis: Lymphedema, not elsewhere classified  Disorder of the skin and subcutaneous tissue related to radiation, unspecified  Muscle weakness (generalized)  Abnormal posture     Problem List Patient Active Problem List   Diagnosis Date Noted  . Anxiety with depression 10/08/2016  . Insomnia 09/22/2016  . Bronchitis 05/26/2016  . Vitamin D deficiency 04/07/2016  . Easy bruising 03/20/2016  . 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 Anemia associated with chemotherapy 10/24/2014  . Malignant neoplasm of upper-outer quadrant of left breast in female, estrogen receptor positive ([place]) 07/31/2014  . Hyperlipidemia 09/06/2012  . Generalized anxiety disorder 09/06/2012  . Meniere's disease 09/06/2012  . Statin intolerance 09/06/2012  . Left rotator cuff tear 09/06/2012   Donato Heinz. Owens Shark PT  Norwood Levo 03/24/2017, 12:16 PM  [place], [place], [address] Phone: [phone]   Fax:  [phone]  Name: [name] MRN: [id number] Date of Birth: [birth year]

## 2017-03-26 ENCOUNTER — Ambulatory Visit: Payer: PRIVATE HEALTH INSURANCE | Admitting: Physical Therapy

## 2017-03-27 ENCOUNTER — Encounter: Payer: PRIVATE HEALTH INSURANCE | Admitting: Physical Therapy

## 2017-03-31 ENCOUNTER — Ambulatory Visit: Payer: PRIVATE HEALTH INSURANCE | Admitting: Physical Therapy

## 2017-04-01 ENCOUNTER — Ambulatory Visit: Payer: PRIVATE HEALTH INSURANCE | Admitting: Physical Therapy

## 2017-04-07 ENCOUNTER — Ambulatory Visit: Payer: PRIVATE HEALTH INSURANCE

## 2017-04-07 ENCOUNTER — Other Ambulatory Visit: Payer: Self-pay | Admitting: *Deleted

## 2017-04-07 DIAGNOSIS — I890 Lymphedema, not elsewhere classified: Secondary | ICD-10-CM

## 2017-04-07 DIAGNOSIS — L599 Disorder of the skin and subcutaneous tissue related to radiation, unspecified: Secondary | ICD-10-CM

## 2017-04-07 NOTE — Patient Instructions (Signed)
Cancer Rehab [phone] Start with circles near neck above collarbones 10 times.  Deep Effective Breath   Standing, sitting, or laying down, place both hands on the belly. Take a deep breath IN, expanding the belly; then breath OUT, contracting the belly. Repeat __5__ times. Do __2-3__ sessions per day and before your self massage.  Axilla to Axilla - Sweep   On uninvolved side make 5 circles in the armpit, then pump _5__ times from involved armpit across chest to uninvolved armpit, making a pathway. Do _1__ time per day.  Copyright  VHI. All rights reserved.  Axilla to Inguinal Nodes - Sweep   On involved side, make 5 circles at groin at panty line, then pump _5__ times from armpit along side of trunk to outer hip, making your other pathway. Do __1_ time per day.  Copyright  VHI. All rights reserved.  Arm Posterior: Elbow to Shoulder - Sweep   Pump _5__ times from back of elbow to top of shoulder. Then inner to outer upper arm _5_ times, then outer arm again _5_ times. Then back to the pathways _2-3_ times. Do _1__ time per day.  Copyright  VHI. All rights reserved.  ARM: [place] Wrist to Elbow - Sweep   Pump or stationary circles _5__ times from wrist to elbow making sure to do both sides of the forearm. Then retrace your steps to the outer arm, and the pathways _2-3_ times each. Do _1__ time per day.  Copyright  VHI. All rights reserved.  ARM: Dorsum of Hand to Shoulder - Sweep   Pump or stationary circles _5__ times on back of hand including knuckle spaces and individual fingers if needed working up towards the wrist, then retrace all your steps working back up the forearm, doing both sides; upper outer arm and back to your pathways _2-3_ times each. Then do 5 circles again at uninvolved armpit and involved groin where you started! Good job!! Do __1_ time per day.

## 2017-04-07 NOTE — Therapy (Signed)
[place], [place], [address] Phone: [phone]   Fax:  [phone]  Physical Therapy Treatment  Patient Details  Name: [name] MRN: [id number] Date of Birth: 18-Jul-1960 Referring Provider: Dr.Gudena    Encounter Date: 04/07/2017  PT End of Session - 04/07/17 1027    Visit Number  6    Number of Visits  17    Date for PT Re-Evaluation  04/21/16    PT Start Time  0936    PT Stop Time  1024    PT Time Calculation (min)  48 min    Activity Tolerance  Patient tolerated treatment well    Behavior During Therapy  [facility] for tasks assessed/performed       Past Medical History:  Diagnosis Date  . Abnormal Pap smear of cervix 2002   Dr. Eilleen Kempf   . Allergy 2012   seasonal  . Anxiety   . Benign hemangioma    Dr,  Marybelle Killings   . Breast cancer ([place]) 07/20/14   Left Breast  . Cervical stenosis (uterine cervix) 2002  . Depression   . Dizziness 1991   secondary to Meniere's disease  . Hyperlipidemia 2007  . Insomnia 1997  . Meniere's disease 1991  . Nervousness(799.21) 1987    Past Surgical History:  Procedure Laterality Date  . AXILLARY LYMPH NODE DISSECTION Left 08/16/2014   Procedure: AXILLARY LYMPH NODE DISSECTION;  Surgeon: Coralie Keens, MD;  Location: [place];  Service: General;  Laterality: Left;  . bilateral  tubal ligation  05/08/2004  . BREAST LUMPECTOMY WITH RADIOACTIVE SEED AND SENTINEL LYMPH NODE BIOPSY Left 08/08/2014   Procedure: BREAST LUMPECTOMY WITH RADIOACTIVE SEED AND SENTINEL LYMPH NODE BIOPSY;  Surgeon: Coralie Keens, MD;  Location: [place];  Service: General;  Laterality: Left;  . CHOLECYSTECTOMY  12/30/2016  . MASTOPEXY Right 12/14/2015   Procedure: RIGHT BREAST MASTOPEXY;  Surgeon: Irene Limbo, MD;  Location: [place];  Service: Plastics;  Laterality: Right;  . PORT-A-CATH REMOVAL N/A 03/08/2015   Procedure:  REMOVAL PORT-A-CATH;  Surgeon: Coralie Keens, MD;  [facility];  Service: General;  Laterality: N/A;  . PORTACATH PLACEMENT Right 08/16/2014   Procedure: INSERTION PORT-A-CATH;  Surgeon: Coralie Keens, MD;  Location: [place];  Service: General;  Laterality: Right;  . RE-EXCISION OF BREAST LUMPECTOMY Left 08/16/2014   Procedure: RE-EXCISION OF LEFT BREAST CANCER AND PORT PLACEMENT/POSSIBLE AXILLARY DISECTION;  Surgeon: Coralie Keens, MD;  Location: [place];  Service: General;  Laterality: Left;  . right breast surg     [place] Right 07/28/2012   Procedure: RIGHT SHOULDER DECOMPRESSION AND EXCISION OF CALCIFIC DEPOSIT;  Surgeon: Tobi Bastos, MD;  [facility];  Service: Orthopedics;  Laterality: Right;  . TUBAL LIGATION    . uterine ablation  02/2006    Due to heavy periods Dr. Jon Billings   . wisdom teeth extractions  yrs ago    There were no vitals filed for this visit.  Subjective Assessment - 04/07/17 0943    Subjective  I had amazing results with the tape after last visit. I lost 6 lbs! The swelling was amazing during that time and when I took the tape off the weight was right back on as was the swelling. I couldn't believe it. So I want to learn how to put the tape on in case I need it between visits  again.     Pertinent History  Left breast cancer with lumpectomy on 08/08/2014 with re-excision 08/16/2014 with total of 6 nodes removed. She has chemotherapy 09/04/2014- 01/15/2015 and radiation 01/29/2015-03/20/2015 She has a laparoscopic cholecystectomy 12/27/2016. right rotator cuff repair 07/28/2012    Patient Stated Goals  to get her arm "under control"     Currently in Pain?  No/denies                      [facility] Adult PT Treatment/Exercise - 04/07/17 0001      Manual Therapy   Myofascial Release  to anterior chest     Manual Lymphatic Drainage (MLD)  In supine, short neck, shoulder  collectors, superficial and deep abdominals, right axillary nodes, anterior interaxillary anastamosis, left inguinal nodes, left axillo-inguinal anastamosis, left anterior chest, axilla, upper arm to hand and return along pathways.  then to sidelying for posterior interaxillary anastamosis, and lateral chest and back       Kinesiotix   Edema  used 3 prongs on kinesiotape today form axlla to inguinal nodes and basketweave I band over puffy areas at axilla. Educated pt about where to go get kinesiotape and skin kote              PT Education - 04/07/17 1028    Education provided  Yes    Education Details  How to don kinesiotape and issued pieces for pt to apply prn before next session; also self MLD    Person(s) Educated  Patient    Methods  Explanation;Demonstration;Handout    Comprehension  Verbalized understanding;Returned demonstration       PT Short Term Goals - 03/24/17 1119      PT SHORT TERM GOAL #1   Title  Pt will verbalize knowledge of lymphedema risk reduction practices     Period  Weeks    Status  Achieved      PT SHORT TERM GOAL #2   Title  Pt will know how to get compression garments to manage trunk and arm swelling during airplane travel     Status  Achieved      PT SHORT TERM GOAL #3   Title  Pt will be independent in home exercise program for postural strengthening     Status  On-going        PT Long Term Goals - 03/24/17 1120      PT LONG TERM GOAL #1   Title  Pt will report 75% improvment in her symptoms of left arm pain and swelling     Baseline  1/15 pt says she has improvement especially with the tape, but it comes back     Time  8    Period  Weeks    Status  On-going      PT LONG TERM GOAL #2   Title  Pt will be knowldegable of the use of manual lymph drainage and compression to manage symptoms     Time  8    Period  Weeks    Status  On-going      PT LONG TERM GOAL #3   Title  Pt will be indepenent in a progressive resistance strengthening  program to decrease the risk of lymphedema exacerbations.     Time  8    Period  Weeks    Status  On-going            Plan - 04/07/17 1029    Clinical Impression Statement  Continued  with manual lymph drainage and instructed pt in this today and issued handout. Also continued with kinesiotape as pt reported great benefit.     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     PT Frequency  2x / week    PT Duration  8 weeks    PT Treatment/Interventions  Manual lymph drainage;ADLs/Self Care Home Management;Compression bandaging;Scar mobilization;Passive range of motion;Taping;Manual techniques;Therapeutic exercise;Therapeutic activities;Orthotic Fit/Training;DME Instruction    PT Next Visit Plan  teach application of kinesiotape with use of pt phone, assess effect of compression and ktape Continue with  self manual lymph drainage to left upper quadarant, myofascial releast to left anterior chest with scar mobilization, Use kinesiotapeif needed   begin with Meeks decompression exercise and progress to supine scapular series and strength ABC program Monitor arm lymphedema and use compression bandaging as indicated     Consulted and Agree with Plan of Care  Patient       Patient will benefit from skilled therapeutic intervention in order to improve the following deficits and impairments:  Decreased endurance, Decreased knowledge of precautions, Decreased scar mobility, Increased edema, Impaired UE functional use, Increased fascial restricitons, Decreased strength, Decreased knowledge of use of DME, Decreased activity tolerance, Pain, Impaired perceived functional ability, Postural dysfunction, Abnormal gait  Visit Diagnosis: Lymphedema, not elsewhere classified  Disorder of the skin and subcutaneous tissue related to radiation, unspecified     Problem List Patient Active Problem List   Diagnosis Date Noted  . Anxiety  with depression 10/08/2016  . Insomnia 09/22/2016  . Bronchitis 05/26/2016  . Vitamin D deficiency 04/07/2016  . Easy bruising 03/20/2016  . 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 Anemia associated with chemotherapy 10/24/2014  . Malignant neoplasm of upper-outer quadrant of left breast in female, estrogen receptor positive ([place]) 07/31/2014  . Hyperlipidemia 09/06/2012  . Generalized anxiety disorder 09/06/2012  . Meniere's disease 09/06/2012  . Statin intolerance 09/06/2012  . Left rotator cuff tear 09/06/2012    [name], PTA 04/07/2017, 10:30 AM  [place], [place], [address] Phone: [phone]   Fax:  [phone]  Name: [name] MRN: [id number] Date of Birth: 1961/02/25

## 2017-04-08 ENCOUNTER — Other Ambulatory Visit: Payer: Self-pay | Admitting: Family Medicine

## 2017-04-09 NOTE — Telephone Encounter (Signed)
I do not know what they Dr. Laurance Flatten is going to be back in office but please forward this to him as it is a controlled substance.  If he is not going to be back in for a while and she is completely out and we can do a 1 month refill one time.

## 2017-04-14 ENCOUNTER — Encounter: Payer: Self-pay | Admitting: Physical Therapy

## 2017-04-14 ENCOUNTER — Ambulatory Visit: Payer: PRIVATE HEALTH INSURANCE | Attending: Hematology and Oncology | Admitting: Physical Therapy

## 2017-04-14 ENCOUNTER — Telehealth: Payer: Self-pay | Admitting: Family Medicine

## 2017-04-14 DIAGNOSIS — R293 Abnormal posture: Secondary | ICD-10-CM | POA: Insufficient documentation

## 2017-04-14 DIAGNOSIS — M6281 Muscle weakness (generalized): Secondary | ICD-10-CM | POA: Insufficient documentation

## 2017-04-14 DIAGNOSIS — I890 Lymphedema, not elsewhere classified: Secondary | ICD-10-CM | POA: Diagnosis present

## 2017-04-14 DIAGNOSIS — L599 Disorder of the skin and subcutaneous tissue related to radiation, unspecified: Secondary | ICD-10-CM | POA: Diagnosis present

## 2017-04-14 NOTE — Patient Instructions (Signed)
Flexion (Isometric)      Cancer Rehab [phone]    Press right fist against wall. Hold __5__ seconds. Repeat _5-10___ times. Do __1-2__ sessions per day.  SHOULDER: Abduction (Isometric)    Use wall as resistance. Press arm against pillow. Hold _5__ seconds. _5-10__ times. Do _1-2__ sessions per day.    External Rotation (Isometric)    Place back of left fist against door frame, with elbow bent. Press fist against door frame. Hold __5__ seconds. Repeat _5-10___ times. Do _1-2___ sessions per day.  Extension (Isometric)    Place left bent elbow and back of arm against wall. Press elbow against wall. Hold __5__ seconds. Repeat _5-10___ times. Do _1-2___ sessions per day.

## 2017-04-14 NOTE — Therapy (Signed)
[place], [place], [address] Phone: [phone]   Fax:  [phone]  Physical Therapy Treatment  Patient Details  Name: [name] MRN: [id number] Date of Birth: 25-Feb-1961 Referring Provider: Dr.Gudena    Encounter Date: 04/14/2017  PT End of Session - 04/14/17 1208    Visit Number  7    Number of Visits  17    Date for PT Re-Evaluation  04/21/16    PT Start Time  1100    PT Stop Time  1155    PT Time Calculation (min)  55 min    Activity Tolerance  Patient tolerated treatment well       Past Medical History:  Diagnosis Date  . Abnormal Pap smear of cervix 2002   Dr. Eilleen Kempf   . Allergy 2012   seasonal  . Anxiety   . Benign hemangioma    Dr,  Marybelle Killings   . Breast cancer ([place]) 07/20/14   Left Breast  . Cervical stenosis (uterine cervix) 2002  . Depression   . Dizziness 1991   secondary to Meniere's disease  . Hyperlipidemia 2007  . Insomnia 1997  . Meniere's disease 1991  . Nervousness(799.21) 1987    Past Surgical History:  Procedure Laterality Date  . AXILLARY LYMPH NODE DISSECTION Left 08/16/2014   Procedure: AXILLARY LYMPH NODE DISSECTION;  Surgeon: Coralie Keens, MD;  Location: [facility];  Service: General;  Laterality: Left;  . bilateral  tubal ligation  05/08/2004  . BREAST LUMPECTOMY WITH RADIOACTIVE SEED AND SENTINEL LYMPH NODE BIOPSY Left 08/08/2014   Procedure: BREAST LUMPECTOMY WITH RADIOACTIVE SEED AND SENTINEL LYMPH NODE BIOPSY;  Surgeon: Coralie Keens, MD;  Location: [facility];  Service: General;  Laterality: Left;  . CHOLECYSTECTOMY  12/30/2016  . MASTOPEXY Right 12/14/2015   Procedure: RIGHT BREAST MASTOPEXY;  Surgeon: Irene Limbo, MD;  Location: [facility];  Service: Plastics;  Laterality: Right;  . PORT-A-CATH REMOVAL N/A 03/08/2015   Procedure: REMOVAL PORT-A-CATH;  Surgeon: Coralie Keens, MD;   Location: [facility];  Service: General;  Laterality: N/A;  . PORTACATH PLACEMENT Right 08/16/2014   Procedure: INSERTION PORT-A-CATH;  Surgeon: Coralie Keens, MD;  Location: [facility];  Service: General;  Laterality: Right;  . RE-EXCISION OF BREAST LUMPECTOMY Left 08/16/2014   Procedure: RE-EXCISION OF LEFT BREAST CANCER AND PORT PLACEMENT/POSSIBLE AXILLARY DISECTION;  Surgeon: Coralie Keens, MD;  Location: [facility];  Service: General;  Laterality: Left;  . right breast surg     [place] Right 07/28/2012   Procedure: RIGHT SHOULDER DECOMPRESSION AND EXCISION OF CALCIFIC DEPOSIT;  Surgeon: Tobi Bastos, MD;  Location: [facility];  Service: Orthopedics;  Laterality: Right;  . TUBAL LIGATION    . uterine ablation  02/2006    Due to heavy periods Dr. Jon Billings   . wisdom teeth extractions  yrs ago    There were no vitals filed for this visit.  Subjective Assessment - 04/14/17 1103    Subjective  Pt report she is having pain today.  She has not been able to do theraband exercises because of pain in her arm.  She stays active.  she walks on the treadmill and does a dancing aerobic  workout for 45 minutes  with her daughter      Pertinent History  Left breast cancer with lumpectomy on 08/08/2014 with re-excision 08/16/2014 with total of 6 nodes removed.  She has chemotherapy 09/04/2014- 01/15/2015 and radiation 01/29/2015-03/20/2015 She has a laparoscopic cholecystectomy 12/27/2016. right rotator cuff repair 07/28/2012    Patient Stated Goals  to get her arm "under control"     Currently in Pain?  Yes    Pain Score  5     Pain Location  Axilla    Pain Orientation  Left    Pain Descriptors / Indicators  -- like a bee sting.     Pain Type  Chronic pain    Pain Radiating Towards  down to her lower arm     Pain Onset  More than a month ago    Pain Frequency  Intermittent    Aggravating Factors        Pain Relieving Factors  kinesiotape  helps                       [facility] Adult PT Treatment/Exercise - 04/14/17 0001      Shoulder Exercises: Isometric Strengthening   Flexion  3X5"    Extension  3X5"    ABduction  3X5"      Manual Therapy   Myofascial Release  to anterior chest     Manual Lymphatic Drainage (MLD)  In supine, short neck, shoulder collectors, superficial and deep abdominals, right axillary nodes, anterior interaxillary anastamosis, left inguinal nodes, left axillo-inguinal anastamosis, left anterior chest, axilla, upper arm to hand and return along pathways.  then to sidelying for posterior interaxillary anastamosis, and lateral chest and back       Kinesiotix   Edema  used 3 prongs on kinesiotape today form axlla to inguinal nodes and basketweave I band over puffy areas at axilla. Pt brought in her skin kote and kinesotatpe which will be used next session              PT Education - 04/14/17 1207    Education provided  Yes    Education Details  shoulder isomtrics     Person(s) Educated  Patient    Methods  Handout;Explanation    Comprehension  Verbalized understanding;Returned demonstration       PT Short Term Goals - 03/24/17 1119      PT SHORT TERM GOAL #1   Title  Pt will verbalize knowledge of lymphedema risk reduction practices     Period  Weeks    Status  Achieved      PT SHORT TERM GOAL #2   Title  Pt will know how to get compression garments to manage trunk and arm swelling during airplane travel     Status  Achieved      PT SHORT TERM GOAL #3   Title  Pt will be independent in home exercise program for postural strengthening     Status  On-going        PT Long Term Goals - 03/24/17 1120      PT LONG TERM GOAL #1   Title  Pt will report 75% improvment in her symptoms of left arm pain and swelling     Baseline  1/15 pt says she has improvement especially with the tape, but it comes back     Time  8    Period  Weeks    Status  On-going      PT LONG TERM  GOAL #2   Title  Pt will be knowldegable of the use of manual lymph drainage and compression to manage symptoms     Time  8  Period  Weeks    Status  On-going      PT LONG TERM GOAL #3   Title  Pt will be indepenent in a progressive resistance strengthening program to decrease the risk of lymphedema exacerbations.     Time  8    Period  Weeks    Status  On-going            Plan - 04/14/17 1208    Clinical Impression Statement  Pt contiues to have congestion in left axilla and lateral left breast with tightness in anterior chest.  Kinsesiotape reapplied as she received relief with this treatment.  Home exercise upgraded to include shoulder isometrics as she had some pain with theraband exercise.  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     PT Frequency  2x / week    PT Duration  8 weeks    PT Treatment/Interventions  Manual lymph drainage;ADLs/Self Care Home Management;Compression bandaging;Scar mobilization;Passive range of motion;Taping;Manual techniques;Therapeutic exercise;Therapeutic activities;Orthotic Fit/Training;DME Instruction    PT Next Visit Plan  teach application of kinesiotape to husbands  with use of pt phone, teach husband MLD techniques Continue with  self manual lymph drainage to left upper quadarant, myofascial releast to left anterior chest with scar mobilization, Assess effect of isometrics and progress to strength ABC program  Monitor arm lymphedema and use compression bandaging as indicated     PT Home Exercise Plan  Meeks decompression, supine scapular series, shoulder isometrics        Patient will benefit from skilled therapeutic intervention in order to improve the following deficits and impairments:  Decreased endurance, Decreased knowledge of precautions, Decreased scar mobility, Increased edema,  Impaired UE functional use, Increased fascial restricitons, Decreased strength, Decreased knowledge of use of DME, Decreased activity tolerance, Pain, Impaired perceived functional ability, Postural dysfunction, Abnormal gait  Visit Diagnosis: Lymphedema, not elsewhere classified  Disorder of the skin and subcutaneous tissue related to radiation, unspecified  Muscle weakness (generalized)  Abnormal posture     Problem List Patient Active Problem List   Diagnosis Date Noted  . Anxiety with depression 10/08/2016  . Insomnia 09/22/2016  . Bronchitis 05/26/2016  . Vitamin D deficiency 04/07/2016  . Easy bruising 03/20/2016  . 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 Anemia associated with chemotherapy 10/24/2014  . Malignant neoplasm of upper-outer quadrant of left breast in female, estrogen receptor positive ([place]) 07/31/2014  . Hyperlipidemia 09/06/2012  . Generalized anxiety disorder 09/06/2012  . Meniere's disease 09/06/2012  . Statin intolerance 09/06/2012  . Left rotator cuff tear 09/06/2012   Donato Heinz. Owens Shark PT  Norwood Levo 04/14/2017, 12:14 PM  [place], [place], [address] Phone: [phone]   Fax:  [phone]  Name: [name] MRN: [id number] Date of Birth: Aug 20, 1960

## 2017-04-14 NOTE — Telephone Encounter (Signed)
pt aware  - Thursday

## 2017-04-16 ENCOUNTER — Ambulatory Visit (INDEPENDENT_AMBULATORY_CARE_PROVIDER_SITE_OTHER): Payer: PRIVATE HEALTH INSURANCE | Admitting: Family Medicine

## 2017-04-16 ENCOUNTER — Encounter: Payer: Self-pay | Admitting: Family Medicine

## 2017-04-16 VITALS — BP 117/74 | HR 72 | Temp 98.3°F | Ht 65.0 in | Wt 164.0 lb

## 2017-04-16 DIAGNOSIS — E7800 Pure hypercholesterolemia, unspecified: Secondary | ICD-10-CM | POA: Diagnosis not present

## 2017-04-16 DIAGNOSIS — H8109 Meniere's disease, unspecified ear: Secondary | ICD-10-CM

## 2017-04-16 DIAGNOSIS — R911 Solitary pulmonary nodule: Secondary | ICD-10-CM

## 2017-04-16 DIAGNOSIS — L03011 Cellulitis of right finger: Secondary | ICD-10-CM

## 2017-04-16 DIAGNOSIS — E559 Vitamin D deficiency, unspecified: Secondary | ICD-10-CM

## 2017-04-16 DIAGNOSIS — C50412 Malignant neoplasm of upper-outer quadrant of left female breast: Secondary | ICD-10-CM

## 2017-04-16 DIAGNOSIS — I700 Atherosclerosis of aorta: Secondary | ICD-10-CM | POA: Diagnosis not present

## 2017-04-16 DIAGNOSIS — Z170 Estrogen receptor positive status [ER+]: Secondary | ICD-10-CM | POA: Diagnosis not present

## 2017-04-16 DIAGNOSIS — Z789 Other specified health status: Secondary | ICD-10-CM | POA: Diagnosis not present

## 2017-04-16 DIAGNOSIS — F418 Other specified anxiety disorders: Secondary | ICD-10-CM | POA: Diagnosis not present

## 2017-04-16 MED ORDER — CEPHALEXIN 500 MG PO CAPS
500.0000 mg | ORAL_CAPSULE | Freq: Three times a day (TID) | ORAL | 0 refills | Status: DC
Start: 2017-04-16 — End: 2017-04-25

## 2017-04-16 NOTE — Patient Instructions (Addendum)
Continue current medications. Continue good therapeutic lifestyle changes which include good diet and exercise. Fall precautions discussed with patient. If an FOBT was given today- please return it to our front desk. If you are over 55 years old - you may need Prevnar 75 or the adult Pneumonia vaccine.  **Flu shots are available--- please call and schedule a FLU-CLINIC appointment**  After your visit with [place] today you will receive a survey in the mail or online from [employer] regarding your care with [place]. Please take a moment to fill this out. Your feedback is very important to [place] as you can help [place] better understand your patient needs as well as improve your experience and satisfaction. WE CARE ABOUT YOU!!!   Pick up some RED YEAST RICE We will arrange a visit with the cardiologist.

## 2017-04-16 NOTE — Progress Notes (Signed)
Subjective:    Patient ID: [name], female    DOB: 12-Jun-1960, 57 y.o.   MRN: [id number]  HPI Pt here for follow up and management of chronic medical problems which includes hyperlipidemia. She is taking medication regularly.  The patient is concerned about some inflammation around the nail of her right index finger.  She is doing well otherwise.  She continues to take her lorazepam.  She continues to be followed by the oncologist because of her breast cancer.  A CT scan that was done in March 2018 indicated aortic atherosclerosis.  Also there was a right upper lobe lung nodule.  Patient is pleasant and positive.  She sees her gynecologist in May.  She sees the oncologist about every 3 months and he follows her regularly.  She denies any chest pain or shortness of breath.  She denies any trouble with swallowing heartburn indigestion nausea vomiting change in bowel habits or dark tarry bowel movements.  She does have occasional bright red blood in the stool from a hard bowel movement.  She is up-to-date on her colonoscopies in the next one is not due until 2024 according to the patient.  She is passing her water without problems.  She has the swelling around the nailbed of the right index finger.    Patient Active Problem List   Diagnosis Date Noted  . Anxiety with depression 10/08/2016  . Insomnia 09/22/2016  . Bronchitis 05/26/2016  . Vitamin D deficiency 04/07/2016  . Easy bruising 03/20/2016  . 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 Anemia associated with chemotherapy 10/24/2014  . Malignant neoplasm of upper-outer quadrant of left breast in female, estrogen receptor positive ([place]) 07/31/2014  . Hyperlipidemia 09/06/2012  . Generalized anxiety disorder 09/06/2012  . Meniere's disease 09/06/2012  . Statin intolerance 09/06/2012  . Left rotator cuff tear 09/06/2012   Outpatient Encounter Medications as of 04/16/2017  Medication Sig  . acetaminophen (TYLENOL) 325 MG tablet Take 975 mg by  mouth daily as needed for moderate pain. Usually takes twice daily in morning and bedtime and additionally during the day if needed for pain  . anastrozole (ARIMIDEX) 1 MG tablet TAKE 1 TABLET EVERY DAY  . Biotin 1000 MCG tablet Take 1,000 mcg by mouth daily.  . clonazePAM (KLONOPIN) 0.5 MG tablet TAKE 1 TABLET BY MOUTH THREE TIMES A DAY AS NEEDED  . donepezil (ARICEPT) 10 MG tablet TAKE 1 TABLET (10 MG TOTAL) BY MOUTH AT BEDTIME.  . Investigational aspirin/placebo 100 MG tablet Alliance [id number] Take 1 tablet by mouth daily. Take with food or a full glass of water. Do not crush enteric coated tablets.  Derrill Memo ON 06/20/2017] Investigational aspirin/placebo 100 MG tablet Alliance [id number] Take 1 tablet by mouth daily. Take with food or a full glass of water. Do not crush enteric coated tablets.  Marland Kitchen LORazepam (ATIVAN) 1 MG tablet TAKE 1 TABLET BY MOUTH AT BEDTIME  . PARoxetine (PAXIL-CR) 25 MG 24 hr tablet Take 1 tablet (25 mg total) by mouth daily.  . rosuvastatin (CRESTOR) 10 MG tablet Take 1 tablet (10 mg total) by mouth daily.  . Turmeric 500 MG TABS Take 1 tablet by mouth daily.  . Vitamin D, Ergocalciferol, (DRISDOL) 50000 units CAPS capsule TAKE 1 CAPSULE (50,000 UNITS TOTAL) BY MOUTH EVERY 7 (SEVEN) DAYS.   No facility-administered encounter medications on file as of 04/16/2017.       Review of Systems  Constitutional: Negative.   HENT: Negative.   Eyes: Negative.  Respiratory: Negative.   Cardiovascular: Negative.   Gastrointestinal: Negative.   Endocrine: Negative.   Genitourinary: Negative.   Musculoskeletal: Negative.   Skin: Negative.        Right pointer finger infection / sore  Allergic/Immunologic: Negative.   Neurological: Negative.   Hematological: Negative.   Psychiatric/Behavioral: Negative.        Objective:   Physical Exam  Constitutional: She is oriented to person, place, and time. She appears well-developed and well-nourished. No distress.  The patient is  pleasant.  She recently had a laparoscopic gallbladder.  She is doing well after this.  HENT:  Head: Normocephalic and atraumatic.  Right Ear: External ear normal.  Left Ear: External ear normal.  Nose: Nose normal.  Mouth/Throat: Oropharynx is clear and moist. No oropharyngeal exudate.  Eyes: Conjunctivae and EOM are normal. Pupils are equal, round, and reactive to light. Right eye exhibits no discharge. Left eye exhibits no discharge. No scleral icterus.  Recent eye exam as of this week and everything was normal.  Neck: Normal range of motion. Neck supple. No thyromegaly present.  No bruits thyromegaly or anterior cervical adenopathy  Cardiovascular: Normal rate, regular rhythm, normal heart sounds and intact distal pulses.  No murmur heard. The heart had a regular rate and rhythm at 72/min.  Pulmonary/Chest: Effort normal and breath sounds normal. No respiratory distress. She has no wheezes. She has no rales.  Clear anteriorly and posteriorly  Abdominal: Soft. Bowel sounds are normal. She exhibits no mass. There is no tenderness. There is no rebound and no guarding.  Laparoscopic gallbladder scars.  No liver or spleen enlargement.  Normal bowel sounds and no mass.  Musculoskeletal: Normal range of motion. She exhibits no edema.  Lymphadenopathy:    She has no cervical adenopathy.  Neurological: She is alert and oriented to person, place, and time. She has normal reflexes. No cranial nerve deficit.  Skin: Skin is warm and dry. No rash noted. There is erythema.  Cellulitis around the nailbed of the right index finger.  Slight tenderness.  No drainage.  Psychiatric: She has a normal mood and affect. Her behavior is normal. Judgment and thought content normal.  Nursing note and vitals reviewed.  BP 117/74 (BP Location: Right Arm)   Pulse 72   Temp 98.3 F (36.8 C) (Oral)   Ht _0  (1.651 m)   Wt 164 lb (74.4 kg)   BMI 27.29 kg/m         Assessment & Plan:  1. Pure  hypercholesterolemia -The patient is statin intolerant.  She will have to continue with as aggressive therapeutic lifestyle changes as possible which include diet and exercise.  There is a strong family history of heart disease and she does have risk factors.  We will plan to schedule her to see the cardiologist for routine evaluation with any suggestions she has to help manage her risk factors. - CBC with Differential/Platelet - BMP8+EGFR - Lipid panel - Hepatic function panel  2. Vitamin D deficiency -Continue vitamin D replacement pending results of lab work - CBC with Differential/Platelet - VITAMIN D 25 Hydroxy (Vit-D Deficiency, Fractures)  3. Meniere's disease, unspecified laterality -This seems to be stable recently with minimal episodes of dizziness. - CBC with Differential/Platelet - BMP8+EGFR  4. Malignant neoplasm of upper-outer quadrant of left breast in female, estrogen receptor positive ([place]) -Continue follow-up with Dr. Sonny Dandy, neurologist - CBC with Differential/Platelet  5. Anxiety with depression -Continue with lorazepam as needed - CBC with Differential/Platelet  6. Statin intolerance -Consider trying red yeast rice. - CBC with Differential/Platelet  7. Aortic atherosclerosis ([place]) -Continue aggressive therapeutic lifestyle changes and trial of red yeast rice - CBC with Differential/Platelet  8. Nodule of upper lobe of right lung -Take copy of CT report with her to her next visit to her oncologist to make sure that a CT scan does not need to be repeated - CBC with Differential/Platelet  9. Cellulitis of right index finger -Keflex 500  3 times daily for 10 days and continue salt water soaks - CBC with Differential/Platelet  Meds ordered this encounter  Medications  . cephALEXin (KEFLEX) 500 MG capsule    Sig: Take 1 capsule (500 mg total) by mouth 3 (three) times daily.    Dispense:  30 capsule    Refill:  0   Patient Instructions  Continue current  medications. Continue good therapeutic lifestyle changes which include good diet and exercise. Fall precautions discussed with patient. If an FOBT was given today- please return it to our front desk. If you are over 51 years old - you may need Prevnar 62 or the adult Pneumonia vaccine.  **Flu shots are available--- please call and schedule a FLU-CLINIC appointment**  After your visit with [place] today you will receive a survey in the mail or online from [employer] regarding your care with [place]. Please take a moment to fill this out. Your feedback is very important to [place] as you can help [place] better understand your patient needs as well as improve your experience and satisfaction. WE CARE ABOUT YOU!!!   Pick up some RED YEAST RICE We will arrange a visit with the cardiologist.   Arrie Senate MD

## 2017-04-17 ENCOUNTER — Encounter: Payer: Self-pay | Admitting: Physical Therapy

## 2017-04-17 ENCOUNTER — Ambulatory Visit: Payer: PRIVATE HEALTH INSURANCE | Admitting: Physical Therapy

## 2017-04-17 DIAGNOSIS — I890 Lymphedema, not elsewhere classified: Secondary | ICD-10-CM | POA: Diagnosis not present

## 2017-04-17 DIAGNOSIS — M6281 Muscle weakness (generalized): Secondary | ICD-10-CM

## 2017-04-17 DIAGNOSIS — R293 Abnormal posture: Secondary | ICD-10-CM

## 2017-04-17 DIAGNOSIS — L599 Disorder of the skin and subcutaneous tissue related to radiation, unspecified: Secondary | ICD-10-CM

## 2017-04-17 NOTE — Therapy (Signed)
[place], [place], [address] Phone: [phone]   Fax:  [phone]  Physical Therapy Treatment  Patient Details  Name: [name] MRN: [id number] Date of Birth: [birth year] Referring Provider: Dr.Gudena    Encounter Date: 04/17/2017  PT End of Session - 04/17/17 1200    Visit Number  8    Number of Visits  17    Date for PT Re-Evaluation  04/21/16    PT Start Time  1100    PT Stop Time  1150    PT Time Calculation (min)  50 min    Activity Tolerance  Patient tolerated treatment well    Behavior During Therapy  [facility] for tasks assessed/performed       Past Medical History:  Diagnosis Date  . Abnormal Pap smear of cervix 2002   Dr. Eilleen Kempf   . Allergy 2012   seasonal  . Anxiety   . Benign hemangioma    Dr,  Marybelle Killings   . Breast cancer ([place]) 07/20/14   Left Breast  . Cervical stenosis (uterine cervix) 2002  . Depression   . Dizziness 1991   secondary to Meniere's disease  . Hyperlipidemia 2007  . Insomnia 1997  . Meniere's disease 1991  . Nervousness(799.21) 1987    Past Surgical History:  Procedure Laterality Date  . AXILLARY LYMPH NODE DISSECTION Left 08/16/2014   Procedure: AXILLARY LYMPH NODE DISSECTION;  Surgeon: Coralie Keens, MD;  Location: [place];  Service: General;  Laterality: Left;  . bilateral  tubal ligation  05/08/2004  . BREAST LUMPECTOMY WITH RADIOACTIVE SEED AND SENTINEL LYMPH NODE BIOPSY Left 08/08/2014   Procedure: BREAST LUMPECTOMY WITH RADIOACTIVE SEED AND SENTINEL LYMPH NODE BIOPSY;  Surgeon: Coralie Keens, MD;  Location: [place];  Service: General;  Laterality: Left;  . CHOLECYSTECTOMY  12/30/2016  . MASTOPEXY Right 12/14/2015   Procedure: RIGHT BREAST MASTOPEXY;  Surgeon: Irene Limbo, MD;  Location: [place];  Service: Plastics;  Laterality: Right;  . PORT-A-CATH REMOVAL N/A 03/08/2015   Procedure:  REMOVAL PORT-A-CATH;  Surgeon: Coralie Keens, MD;  [facility];  Service: General;  Laterality: N/A;  . PORTACATH PLACEMENT Right 08/16/2014   Procedure: INSERTION PORT-A-CATH;  Surgeon: Coralie Keens, MD;  Location: [place];  Service: General;  Laterality: Right;  . RE-EXCISION OF BREAST LUMPECTOMY Left 08/16/2014   Procedure: RE-EXCISION OF LEFT BREAST CANCER AND PORT PLACEMENT/POSSIBLE AXILLARY DISECTION;  Surgeon: Coralie Keens, MD;  Location: [place];  Service: General;  Laterality: Left;  . right breast surg     [place] Right 07/28/2012   Procedure: RIGHT SHOULDER DECOMPRESSION AND EXCISION OF CALCIFIC DEPOSIT;  Surgeon: Tobi Bastos, MD;  [facility];  Service: Orthopedics;  Laterality: Right;  . TUBAL LIGATION    . uterine ablation  02/2006    Due to heavy periods Dr. Jon Billings   . wisdom teeth extractions  yrs ago    There were no vitals filed for this visit.  Subjective Assessment - 04/17/17 1116    Subjective  pt has infection on the right finger and is on an antibiotic for that   Her husband comes in with her today to obeserve and learn the treatment especially the kinesiotape application     Pertinent History  Left breast cancer with lumpectomy on 08/08/2014 with re-excision 08/16/2014 with total of 6 nodes removed. She has chemotherapy 09/04/2014-  01/15/2015 and radiation 01/29/2015-03/20/2015 She has a laparoscopic cholecystectomy 12/27/2016. right rotator cuff repair 07/28/2012    Patient Stated Goals  to get her arm "under control"     Currently in Pain?  Yes    Pain Score  3     Pain Location  Axilla    Pain Orientation  Left                      [facility] Adult PT Treatment/Exercise - 04/17/17 0001      Manual Therapy   Manual therapy comments  husband observed and was able to perform to back and lateral trunk with hand over hand instruction     Edema Management  removed  kinesiotape and pt has less fullness around axilla but had increased fullness between scars on lateral chest.      Manual Lymphatic Drainage (MLD)  In supine, short neck, shoulder collectors, superficial and deep abdominals, right axillary nodes, anterior interaxillary anastamosis, left inguinal nodes, left axillo-inguinal anastamosis, left anterior chest, axilla, upper arm to hand and return along pathways.  then to sidelying for posterior interaxillary anastamosis, and lateral chest and back       Kinesiotix   Edema  used 3 prongs on kinesiotape today form axlla to inguinal nodes and basketweave I band over puffy areas at axilla. Husband observed and was attentive and felt that he could apply it at home             PT Education - 04/17/17 1158    Education provided  Yes    Education Details  manual lymph drainage and kinesiotape application     Person(s) Educated  Patient    Methods  Explanation;Demonstration    Comprehension  Verbalized understanding;Returned demonstration       PT Short Term Goals - 03/24/17 1119      PT SHORT TERM GOAL #1   Title  Pt will verbalize knowledge of lymphedema risk reduction practices     Period  Weeks    Status  Achieved      PT SHORT TERM GOAL #2   Title  Pt will know how to get compression garments to manage trunk and arm swelling during airplane travel     Status  Achieved      PT SHORT TERM GOAL #3   Title  Pt will be independent in home exercise program for postural strengthening     Status  On-going        PT Long Term Goals - 03/24/17 1120      PT LONG TERM GOAL #1   Title  Pt will report 75% improvment in her symptoms of left arm pain and swelling     Baseline  1/15 pt says she has improvement especially with the tape, but it comes back     Time  8    Period  Weeks    Status  On-going      PT LONG TERM GOAL #2   Title  Pt will be knowldegable of the use of manual lymph drainage and compression to manage symptoms     Time   8    Period  Weeks    Status  On-going      PT LONG TERM GOAL #3   Title  Pt will be indepenent in a progressive resistance strengthening program to decrease the risk of lymphedema exacerbations.     Time  8    Period  Weeks  Status  On-going            Plan - 04/17/17 1200    Clinical Impression Statement  Pt feels improvement with kinesiotape, but she continues to have tender tightness in pec major area and lateral chest between her scars with are contracted .  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     PT Treatment/Interventions  Manual lymph drainage;ADLs/Self Care Home Management;Compression bandaging;Scar mobilization;Passive range of motion;Taping;Manual techniques;Therapeutic exercise;Therapeutic activities;Orthotic Fit/Training;DME Instruction    PT Next Visit Plan  focus myofascial releast to left anterior chest with scar mobilization, continue with MLD and ktape Assess effect of isometrics and progress to strength ABC program  Monitor arm lymphedema and use compression bandaging as indicated        Patient will benefit from skilled therapeutic intervention in order to improve the following deficits and impairments:  Decreased endurance, Decreased knowledge of precautions, Decreased scar mobility, Increased edema, Impaired UE functional use, Increased fascial restricitons, Decreased strength, Decreased knowledge of use of DME, Decreased activity tolerance, Pain, Impaired perceived functional ability, Postural dysfunction, Abnormal gait  Visit Diagnosis: Lymphedema, not elsewhere classified  Disorder of the skin and subcutaneous tissue related to radiation, unspecified  Muscle weakness (generalized)  Abnormal posture     Problem List Patient Active Problem List   Diagnosis Date  Noted  . Aortic atherosclerosis ([place]) 04/16/2017  . Nodule of upper lobe of right lung 04/16/2017  . Anxiety with depression 10/08/2016  . Insomnia 09/22/2016  . Bronchitis 05/26/2016  . Vitamin D deficiency 04/07/2016  . Easy bruising 03/20/2016  . 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 Anemia associated with chemotherapy 10/24/2014  . Malignant neoplasm of upper-outer quadrant of left breast in female, estrogen receptor positive ([place]) 07/31/2014  . Hyperlipidemia 09/06/2012  . Generalized anxiety disorder 09/06/2012  . Meniere's disease 09/06/2012  . Statin intolerance 09/06/2012  . Left rotator cuff tear 09/06/2012   Donato Heinz. Owens Shark PT  Norwood Levo 04/17/2017, 12:03 PM  [place], [place], [address] Phone: [phone]   Fax:  [phone]  Name: [name] MRN: [id number] Date of Birth: April 20, 1960

## 2017-04-20 LAB — CBC WITH DIFFERENTIAL/PLATELET
Basophils Absolute: 0 10*3/uL (ref 0.0–0.2)
Basos: 0 %
EOS (ABSOLUTE): 0.1 10*3/uL (ref 0.0–0.4)
EOS: 1 %
HEMATOCRIT: 42.1 % (ref 34.0–46.6)
Hemoglobin: 13.8 g/dL (ref 11.1–15.9)
IMMATURE GRANULOCYTES: 0 %
Immature Grans (Abs): 0 10*3/uL (ref 0.0–0.1)
LYMPHS ABS: 2.4 10*3/uL (ref 0.7–3.1)
Lymphs: 45 %
MCH: 32.3 pg (ref 26.6–33.0)
MCHC: 32.8 g/dL (ref 31.5–35.7)
MCV: 99 fL — AB (ref 79–97)
MONOS ABS: 0.4 10*3/uL (ref 0.1–0.9)
Monocytes: 7 %
NEUTROS PCT: 47 %
Neutrophils Absolute: 2.5 10*3/uL (ref 1.4–7.0)
Platelets: 162 10*3/uL (ref 150–379)
RBC: 4.27 x10E6/uL (ref 3.77–5.28)
RDW: 14.3 % (ref 12.3–15.4)
WBC: 5.4 10*3/uL (ref 3.4–10.8)

## 2017-04-20 LAB — BMP8+EGFR
BUN / CREAT RATIO: 21 (ref 9–23)
BUN: 10 mg/dL (ref 6–24)
CALCIUM: 9 mg/dL (ref 8.7–10.2)
CO2: 21 mmol/L (ref 20–29)
Chloride: 104 mmol/L (ref 96–106)
Creatinine, Ser: 0.48 mg/dL — ABNORMAL LOW (ref 0.57–1.00)
GFR, EST AFRICAN AMERICAN: 128 mL/min/{1.73_m2} (ref >59)
GFR, EST NON AFRICAN AMERICAN: 111 mL/min/{1.73_m2} (ref >59)
Glucose: 107 mg/dL — ABNORMAL HIGH (ref 65–99)
Potassium: 4.2 mmol/L (ref 3.5–5.2)
Sodium: 142 mmol/L (ref 134–144)

## 2017-04-20 LAB — LIPID PANEL
CHOL/HDL RATIO: 2.4 ratio (ref 0.0–4.4)
Cholesterol, Total: 133 mg/dL (ref 100–199)
HDL: 56 mg/dL (ref >39)
LDL Calculated: 55 mg/dL (ref 0–99)
Triglycerides: 110 mg/dL (ref 0–149)
VLDL CHOLESTEROL CAL: 22 mg/dL (ref 5–40)

## 2017-04-20 LAB — HEPATIC FUNCTION PANEL
ALBUMIN: 4.8 g/dL (ref 3.5–5.5)
ALT: 24 IU/L (ref 0–32)
AST: 20 IU/L (ref 0–40)
Alkaline Phosphatase: 76 IU/L (ref 39–117)
BILIRUBIN TOTAL: 0.3 mg/dL (ref 0.0–1.2)
BILIRUBIN, DIRECT: 0.12 mg/dL (ref 0.00–0.40)
TOTAL PROTEIN: 6.9 g/dL (ref 6.0–8.5)

## 2017-04-20 LAB — VITAMIN D 25 HYDROXY (VIT D DEFICIENCY, FRACTURES): VIT D 25 HYDROXY: 36.2 ng/mL (ref 30.0–100.0)

## 2017-04-21 ENCOUNTER — Encounter: Payer: Self-pay | Admitting: Physical Therapy

## 2017-04-21 ENCOUNTER — Ambulatory Visit: Payer: PRIVATE HEALTH INSURANCE | Admitting: Physical Therapy

## 2017-04-21 DIAGNOSIS — I890 Lymphedema, not elsewhere classified: Secondary | ICD-10-CM

## 2017-04-21 DIAGNOSIS — M6281 Muscle weakness (generalized): Secondary | ICD-10-CM

## 2017-04-21 DIAGNOSIS — L599 Disorder of the skin and subcutaneous tissue related to radiation, unspecified: Secondary | ICD-10-CM

## 2017-04-21 DIAGNOSIS — R293 Abnormal posture: Secondary | ICD-10-CM

## 2017-04-21 NOTE — Therapy (Signed)
[place], [place], [address] Phone: [phone]   Fax:  [phone]  Physical Therapy Treatment  Patient Details  Name: [name] MRN: [id number] Date of Birth: 11/30/1960 Referring Provider: Dr. Lindi Adie    Encounter Date: 04/21/2017  PT End of Session - 04/21/17 1246    Visit Number  9    Number of Visits  25    Date for PT Re-Evaluation  06/19/17    PT Start Time  0998    PT Stop Time  1100    PT Time Calculation (min)  45 min    Activity Tolerance  Patient tolerated treatment well    Behavior During Therapy  [facility] for tasks assessed/performed       Past Medical History:  Diagnosis Date  . Abnormal Pap smear of cervix 2002   Dr. Eilleen Kempf   . Allergy 2012   seasonal  . Anxiety   . Benign hemangioma    Dr,  Marybelle Killings   . Breast cancer ([place]) 07/20/14   Left Breast  . Cervical stenosis (uterine cervix) 2002  . Depression   . Dizziness 1991   secondary to Meniere's disease  . Hyperlipidemia 2007  . Insomnia 1997  . Meniere's disease 1991  . Nervousness(799.21) 1987    Past Surgical History:  Procedure Laterality Date  . AXILLARY LYMPH NODE DISSECTION Left 08/16/2014   Procedure: AXILLARY LYMPH NODE DISSECTION;  Surgeon: Coralie Keens, MD;  Location: [place];  Service: General;  Laterality: Left;  . bilateral  tubal ligation  05/08/2004  . BREAST LUMPECTOMY WITH RADIOACTIVE SEED AND SENTINEL LYMPH NODE BIOPSY Left 08/08/2014   Procedure: BREAST LUMPECTOMY WITH RADIOACTIVE SEED AND SENTINEL LYMPH NODE BIOPSY;  Surgeon: Coralie Keens, MD;  Location: [place];  Service: General;  Laterality: Left;  . CHOLECYSTECTOMY  12/30/2016  . MASTOPEXY Right 12/14/2015   Procedure: RIGHT BREAST MASTOPEXY;  Surgeon: Irene Limbo, MD;  Location: [place];  Service: Plastics;  Laterality: Right;  . PORT-A-CATH REMOVAL N/A 03/08/2015   Procedure: REMOVAL PORT-A-CATH;  Surgeon: Coralie Keens, MD;  [facility];  Service: General;  Laterality: N/A;  . PORTACATH PLACEMENT Right 08/16/2014   Procedure: INSERTION PORT-A-CATH;  Surgeon: Coralie Keens, MD;  Location: [place];  Service: General;  Laterality: Right;  . RE-EXCISION OF BREAST LUMPECTOMY Left 08/16/2014   Procedure: RE-EXCISION OF LEFT BREAST CANCER AND PORT PLACEMENT/POSSIBLE AXILLARY DISECTION;  Surgeon: Coralie Keens, MD;  Location: [place];  Service: General;  Laterality: Left;  . right breast surg     [place] Right 07/28/2012   Procedure: RIGHT SHOULDER DECOMPRESSION AND EXCISION OF CALCIFIC DEPOSIT;  Surgeon: Tobi Bastos, MD;  [facility];  Service: Orthopedics;  Laterality: Right;  . TUBAL LIGATION    . uterine ablation  02/2006    Due to heavy periods Dr. Jon Billings   . wisdom teeth extractions  yrs ago    There were no vitals filed for this visit.  Subjective Assessment - 04/21/17 1019    Subjective  Pt states she is going on a plane tomorrow.  She will wear the kinesiotape on the way down, and will see how she does and they possibley wear the sleeve back home The tape that we put on Friday stayed on til last night     Pertinent History  Left breast cancer with lumpectomy on 08/08/2014 with  re-excision 08/16/2014 with total of 6 nodes removed. She has chemotherapy 09/04/2014- 01/15/2015 and radiation 01/29/2015-03/20/2015 She has a laparoscopic cholecystectomy 12/27/2016. right rotator cuff repair 07/28/2012    Patient Stated Goals  to get her arm "under control"     Currently in Pain?  Yes    Pain Score  3     Pain Location  Axilla some throught forwarm     Pain Orientation  Left         [facility] PT Assessment - 04/21/17 0001      Assessment   Medical Diagnosis  left breast cancer     Referring Provider  Dr. Lindi Adie     Onset Date/Surgical Date  08/08/14      Prior  Function   Level of Independence  Independent                  [facility] Adult PT Treatment/Exercise - 04/21/17 0001      Self-Care   Self-Care  Other Self-Care Comments    Other Self-Care Comments   issued flyer for yoga classes through the cancer center       Lumbar Exercises: Stretches   Lower Trunk Rotation  2 reps;10 seconds;Limitations;Other (comment)    Lower Trunk Rotation Limitations  left knee up to chest with right leg straight and left arm out to side for increased stretch       Manual Therapy   Manual Therapy  Soft tissue mobilization;Myofascial release    Soft tissue mobilization  with pt in sidelying and with biotone soft tissue work to tight muscles in left lateral neck, upper trap, periscapular muscles and anterior chest.     Myofascial Release  prolonged presure to muliple tight trigger points in left lattissimus,lower, middle and upper trap, levator  lateral neck and subcapital musles.  Pt felt release with prolonged pressure and was participative with treament.     Kinesiotex  Edema      Kinesiotix   Edema  used 3 prongs on kinesiotape today form axlla to inguinal nodes and basketweave I band over puffy areas at axilla. Husband observed and was attentive and felt that he could apply it at home             PT Education - 04/21/17 1245    Education provided  Yes    Education Details  lower trunk rotation, yoga classes     Person(s) Educated  Patient    Methods  Explanation;Demonstration;Handout    Comprehension  Verbalized understanding;Returned demonstration       PT Short Term Goals - 04/21/17 1251      PT SHORT TERM GOAL #1   Title  Pt will verbalize knowledge of lymphedema risk reduction practices     Status  Achieved      PT SHORT TERM GOAL #2   Title  Pt will know how to get compression garments to manage trunk and arm swelling during airplane travel     Status  Achieved      PT SHORT TERM GOAL #3   Title  Pt will be independent in  home exercise program for postural strengthening     Status  Achieved        PT Long Term Goals - 04/21/17 1252      PT LONG TERM GOAL #1   Title  Pt will report 75% improvment in her symptoms of left arm pain and swelling     Baseline  1/15 pt says she has improvement especially  with the tape, but it comes back , 04/21/2016 pt reports 50% improvement     Time  8    Period  Weeks    Status  On-going      PT LONG TERM GOAL #2   Title  Pt will be knowldegable of the use of manual lymph drainage and compression to manage symptoms     Status  Achieved      PT LONG TERM GOAL #3   Title  Pt will be indepenent in a progressive resistance strengthening program to decrease the risk of lymphedema exacerbations.     Time  8    Period  Weeks    Status  On-going      PT LONG TERM GOAL #4   Title  Pt will report 50% decrease in pain from trigger points in left upper quadrant     Time  8    Status  New            Plan - 04/21/17 1247    Clinical Impression Statement  Pt and husband are working on managing lymphedema at home and pt is receiving benefit from kinesiotape.  She was able to progress to more focused soft tissue work today on tightness and trigger points in left scapular , cervical and anterior chest muscles. Decrease in trigger points was elicited with deep pressure and soft tissue work and pt was able to perform more effective stretching after treatment.  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     PT Frequency  2x / week    PT Duration  8 weeks    PT Treatment/Interventions  Manual lymph drainage;ADLs/Self Care Home Management;Compression bandaging;Scar mobilization;Passive range  of motion;Taping;Manual techniques;Therapeutic exercise;Therapeutic activities;Orthotic Fit/Training;DME Instruction;Dry needling    PT Next Visit Plan  focus myofascial releast to left anterior chest with scar mobilization, continue with MLD and ktape Assess effect of isometrics and progress to strength ABC program  Monitor arm lymphedema and use compression bandaging as indicated     PT Home Exercise Plan  [place]ks decompression, supine scapular series, shoulder isometrics ,lower trunk rotation     Consulted and Agree with Plan of Care  Patient       Patient will benefit from skilled therapeutic intervention in order to improve the following deficits and impairments:  Decreased endurance, Decreased knowledge of precautions, Decreased scar mobility, Increased edema, Impaired UE functional use, Increased fascial restricitons, Decreased strength, Decreased knowledge of use of DME, Decreased activity tolerance, Pain, Impaired perceived functional ability, Postural dysfunction, Abnormal gait  Visit Diagnosis: Lymphedema, not elsewhere classified - Plan: PT plan of care cert/re-cert  Disorder of the skin and subcutaneous tissue related to radiation, unspecified - Plan: PT plan of care cert/re-cert  Muscle weakness (generalized) - Plan: PT plan of care cert/re-cert  Abnormal posture - Plan: PT plan of care cert/re-cert     Problem List Patient Active Problem List   Diagnosis Date Noted  . Aortic atherosclerosis ([place]) 04/16/2017  . Nodule of upper lobe of right lung 04/16/2017  . Anxiety with depression 10/08/2016  . Insomnia 09/22/2016  . Bronchitis 05/26/2016  . Vitamin D deficiency 04/07/2016  .  Easy bruising 03/20/2016  . 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 Anemia associated with chemotherapy 10/24/2014  . Malignant neoplasm of upper-outer quadrant of left breast in female, estrogen receptor positive ([place]) 07/31/2014  . Hyperlipidemia 09/06/2012  . Generalized anxiety disorder 09/06/2012  .  Meniere's disease 09/06/2012  . Statin intolerance 09/06/2012  . Left rotator cuff tear 09/06/2012   Donato Heinz. Owens Shark PT  Norwood Levo 04/21/2017, 12:56 PM  [place], [place], [address] Phone: [phone]   Fax:  [phone]  Name: [name] MRN: [id number] Date of Birth: 09-Apr-1960

## 2017-04-24 ENCOUNTER — Encounter: Payer: PRIVATE HEALTH INSURANCE | Admitting: Physical Therapy

## 2017-04-28 ENCOUNTER — Encounter: Payer: PRIVATE HEALTH INSURANCE | Admitting: Physical Therapy

## 2017-04-30 ENCOUNTER — Telehealth: Payer: Self-pay | Admitting: Family Medicine

## 2017-04-30 NOTE — Telephone Encounter (Signed)
She calls and states that finger is some better, but not completely. She thinks that she needs another antibiotic OR a refill on the last one.  She said you wanted her to call with an update.Marland KitchenMarland Kitchen

## 2017-04-30 NOTE — Telephone Encounter (Signed)
Patient was on cephalexin recently.  If she is not allergic to sulfa have her take Septra DS 1 twice daily with food for 10 days.

## 2017-05-01 ENCOUNTER — Ambulatory Visit: Payer: PRIVATE HEALTH INSURANCE | Admitting: Physical Therapy

## 2017-05-01 MED ORDER — SULFAMETHOXAZOLE-TRIMETHOPRIM 800-160 MG PO TABS: 1.0000 | ORAL_TABLET | Freq: Two times a day (BID) | ORAL | 0 refills | Status: DC

## 2017-05-05 ENCOUNTER — Ambulatory Visit: Payer: PRIVATE HEALTH INSURANCE | Admitting: Physical Therapy

## 2017-05-05 ENCOUNTER — Encounter: Payer: Self-pay | Admitting: Physical Therapy

## 2017-05-05 DIAGNOSIS — L599 Disorder of the skin and subcutaneous tissue related to radiation, unspecified: Secondary | ICD-10-CM

## 2017-05-05 DIAGNOSIS — I890 Lymphedema, not elsewhere classified: Secondary | ICD-10-CM

## 2017-05-05 DIAGNOSIS — R293 Abnormal posture: Secondary | ICD-10-CM

## 2017-05-05 DIAGNOSIS — M6281 Muscle weakness (generalized): Secondary | ICD-10-CM

## 2017-05-05 NOTE — Therapy (Signed)
[place], [place], [address] Phone: [phone]   Fax:  [phone]  Physical Therapy Treatment  Patient Details  Name: [name] MRN: [id number] Date of Birth: November 08, 1960 Referring Provider: Dr. Lindi Adie    Encounter Date: 05/05/2017  PT End of Session - 05/05/17 1242    Visit Number  10    Number of Visits  25    Date for PT Re-Evaluation  06/19/17    PT Start Time  1017    PT Stop Time  1108    PT Time Calculation (min)  53 min    Activity Tolerance  Patient tolerated treatment well    Behavior During Therapy  [facility] for tasks assessed/performed       Past Medical History:  Diagnosis Date  . Abnormal Pap smear of cervix 2002   Dr. Eilleen Kempf   . Allergy 2012   seasonal  . Anxiety   . Benign hemangioma    Dr,  Marybelle Killings   . Breast cancer ([place]) 07/20/14   Left Breast  . Cervical stenosis (uterine cervix) 2002  . Depression   . Dizziness 1991   secondary to Meniere's disease  . Hyperlipidemia 2007  . Insomnia 1997  . Meniere's disease 1991  . Nervousness(799.21) 1987    Past Surgical History:  Procedure Laterality Date  . AXILLARY LYMPH NODE DISSECTION Left 08/16/2014   Procedure: AXILLARY LYMPH NODE DISSECTION;  Surgeon: Coralie Keens, MD;  Location: [place];  Service: General;  Laterality: Left;  . bilateral  tubal ligation  05/08/2004  . BREAST LUMPECTOMY WITH RADIOACTIVE SEED AND SENTINEL LYMPH NODE BIOPSY Left 08/08/2014   Procedure: BREAST LUMPECTOMY WITH RADIOACTIVE SEED AND SENTINEL LYMPH NODE BIOPSY;  Surgeon: Coralie Keens, MD;  Location: [place];  Service: General;  Laterality: Left;  . CHOLECYSTECTOMY  12/30/2016  . MASTOPEXY Right 12/14/2015   Procedure: RIGHT BREAST MASTOPEXY;  Surgeon: Irene Limbo, MD;  Location: [place];  Service: Plastics;  Laterality: Right;  . PORT-A-CATH REMOVAL N/A 03/08/2015    Procedure: REMOVAL PORT-A-CATH;  Surgeon: Coralie Keens, MD;  Location: [facility];  Service: General;  Laterality: N/A;  . PORTACATH PLACEMENT Right 08/16/2014   Procedure: INSERTION PORT-A-CATH;  Surgeon: Coralie Keens, MD;  Location: [place];  Service: General;  Laterality: Right;  . RE-EXCISION OF BREAST LUMPECTOMY Left 08/16/2014   Procedure: RE-EXCISION OF LEFT BREAST CANCER AND PORT PLACEMENT/POSSIBLE AXILLARY DISECTION;  Surgeon: Coralie Keens, MD;  Location: [place];  Service: General;  Laterality: Left;  . right breast surg     [place] Right 07/28/2012   Procedure: RIGHT SHOULDER DECOMPRESSION AND EXCISION OF CALCIFIC DEPOSIT;  Surgeon: Tobi Bastos, MD;  Location: [facility];  Service: Orthopedics;  Laterality: Right;  . TUBAL LIGATION    . uterine ablation  02/2006    Due to heavy periods Dr. Jon Billings   . wisdom teeth extractions  yrs ago    There were no vitals filed for this visit.  Subjective Assessment - 05/05/17 1017    Subjective  pt has been sick with flu, light headed and nauseous, but feels better now.  She feels that the swelling in her left axilla has returned and she has intermittent tinlging down left arm and feels that it is swollen     Pertinent History  Left breast cancer with lumpectomy on 08/08/2014 with re-excision 08/16/2014 with total  of 6 nodes removed. She has chemotherapy 09/04/2014- 01/15/2015 and radiation 01/29/2015-03/20/2015 She has a laparoscopic cholecystectomy 12/27/2016. right rotator cuff repair 07/28/2012    Patient Stated Goals  to get her arm "under control"     Currently in Pain?  Yes    Pain Score  4     Pain Location  Axilla back and up to neck and down to forearm     Pain Orientation  Left    Pain Descriptors / Indicators  Sharp    Pain Radiating Towards  to lower arm     Pain Onset  More than a month ago    Pain Frequency  Constant             LYMPHEDEMA/ONCOLOGY QUESTIONNAIRE - 05/05/17 1019      Left Upper Extremity Lymphedema   10 cm Proximal to Olecranon Process  29 cm    Olecranon Process  26 cm    15 cm Proximal to Ulnar Styloid Process  25.8 cm    10 cm Proximal to Ulnar Styloid Process  23 cm    Just Proximal to Ulnar Styloid Process  17 cm    Across Hand at [employer]  18 cm    At Why of 2nd Digit  6.1 cm               [facility] Adult PT Treatment/Exercise - 05/05/17 0001      Manual Therapy   Manual Therapy  Soft tissue mobilization;Myofascial release;Manual Lymphatic Drainage (MLD);Manual Traction    Soft tissue mobilization  with pt in sidelying and with biotone soft tissue work to tight muscles in left lateral neck, upper trap, periscapular muscles and anterior chest.     Myofascial Release  prolonged presure to muliple tight trigger points in left lattissimus,lower, middle and upper trap, levator  lateral neck and subcapital musles.  Pt felt release with prolonged pressure and was participative with treament.     Manual Lymphatic Drainage (MLD)  In supine, short neck, shoulder collectors, superficial and deep abdominals, right axillary nodes, anterior interaxillary anastamosis, left inguinal nodes, left axillo-inguinal anastamosis, left anterior chest, axilla, upper arm to hand and return along pathways.  then to sidelying for posterior interaxillary anastamosis, and lateral chest and back     Manual Traction  briefly to cervical area with pt in supine     Kinesiotex  Edema      Kinesiotix   Edema  used 3 prongs on kinesiotape today form axlla to inguinal nodes and basketweave I band over puffy areas at axilla. Also applies a 4 pronged fan on left forearm to medical elbow               PT Short Term Goals - 04/21/17 1251      PT SHORT TERM GOAL #1   Title  Pt will verbalize knowledge of lymphedema risk reduction practices     Status  Achieved      PT SHORT TERM GOAL #2    Title  Pt will know how to get compression garments to manage trunk and arm swelling during airplane travel     Status  Achieved      PT SHORT TERM GOAL #3   Title  Pt will be independent in home exercise program for postural strengthening     Status  Achieved        PT Long Term Goals - 04/21/17 1252      PT LONG TERM GOAL #1  Title  Pt will report 75% improvment in her symptoms of left arm pain and swelling     Baseline  1/15 pt says she has improvement especially with the tape, but it comes back , 04/21/2016 pt reports 50% improvement     Time  8    Period  Weeks    Status  On-going      PT LONG TERM GOAL #2   Title  Pt will be knowldegable of the use of manual lymph drainage and compression to manage symptoms     Status  Achieved      PT LONG TERM GOAL #3   Title  Pt will be indepenent in a progressive resistance strengthening program to decrease the risk of lymphedema exacerbations.     Time  8    Period  Weeks    Status  On-going      PT LONG TERM GOAL #4   Title  Pt will report 50% decrease in pain from trigger points in left upper quadrant     Time  8    Status  New            Plan - 05/05/17 1242    Clinical Impression Statement  Pt has had return of fullness and pain in her left upper quadrant after episode of illness with internittent tingling and burning in her left forearm from ??   Visible fullness in left axilla and around scars. no increase in arm circumference  Pt had increased trigger points in left posterior shoulder, upper trap and neck that were worked on as possilbe source of referred pain to arm  Tried kinesiotape to forearm as she has had good response to axilla.     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 focus myofascial releast to left anterior chest with scar mobilization, continue with MLD and ktape Assess effect of isometrics and progress to strength ABC program  Monitor arm lymphedema and use compression bandaging as indicated     Consulted and Agree with Plan of Care  Patient       Patient will benefit from skilled therapeutic intervention in order to improve the following deficits and impairments:  Decreased endurance, Decreased knowledge of precautions, Decreased scar mobility, Increased edema, Impaired UE functional use, Increased fascial restricitons, Decreased strength, Decreased knowledge of use of DME, Decreased activity tolerance, Pain, Impaired perceived functional ability, Postural dysfunction, Abnormal gait  Visit Diagnosis: Lymphedema, not elsewhere classified  Disorder of the skin and subcutaneous tissue related to radiation, unspecified  Muscle weakness (generalized)  Abnormal posture     Problem List Patient Active Problem List   Diagnosis Date Noted  . Aortic atherosclerosis ([place]) 04/16/2017  . Nodule of upper lobe of right lung 04/16/2017  . Anxiety with depression 10/08/2016  . Insomnia 09/22/2016  . Bronchitis 05/26/2016  . Vitamin D deficiency 04/07/2016  . Easy bruising 03/20/2016  . 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 Anemia associated with chemotherapy 10/24/2014  . Malignant neoplasm of upper-outer quadrant of left breast in female, estrogen receptor positive ([place]) 07/31/2014  . Hyperlipidemia 09/06/2012  . Generalized anxiety disorder 09/06/2012  . Meniere's disease 09/06/2012  . Statin intolerance 09/06/2012  .  Left rotator cuff tear 09/06/2012   Donato Heinz. Owens Shark PT  Norwood Levo 05/05/2017, 12:46 PM  [place], [place], [address] Phone: [phone]   Fax:  [phone]  Name: [name] MRN: [id number] Date of Birth: 09-Sep-1960

## 2017-05-08 ENCOUNTER — Encounter: Payer: Self-pay | Admitting: Physical Therapy

## 2017-05-08 ENCOUNTER — Ambulatory Visit: Payer: PRIVATE HEALTH INSURANCE | Attending: Hematology and Oncology | Admitting: Physical Therapy

## 2017-05-08 DIAGNOSIS — M6281 Muscle weakness (generalized): Secondary | ICD-10-CM | POA: Insufficient documentation

## 2017-05-08 DIAGNOSIS — L599 Disorder of the skin and subcutaneous tissue related to radiation, unspecified: Secondary | ICD-10-CM | POA: Insufficient documentation

## 2017-05-08 DIAGNOSIS — I890 Lymphedema, not elsewhere classified: Secondary | ICD-10-CM | POA: Insufficient documentation

## 2017-05-08 DIAGNOSIS — R293 Abnormal posture: Secondary | ICD-10-CM | POA: Insufficient documentation

## 2017-05-08 NOTE — Therapy (Signed)
[place], [place], [address] Phone: [phone]   Fax:  [phone]  Physical Therapy Treatment  Patient Details  Name: [name] MRN: [id number] Date of Birth: [birth year] Referring Provider: Dr. Lindi Adie    Encounter Date: 05/08/2017  PT End of Session - 05/08/17 1312    Visit Number  11    Number of Visits  25    Date for PT Re-Evaluation  06/19/17    PT Start Time  1115    PT Stop Time  1205    PT Time Calculation (min)  50 min       Past Medical History:  Diagnosis Date  . Abnormal Pap smear of cervix 2002   Dr. Eilleen Kempf   . Allergy 2012   seasonal  . Anxiety   . Benign hemangioma    Dr,  Marybelle Killings   . Breast cancer ([place]) 07/20/14   Left Breast  . Cervical stenosis (uterine cervix) 2002  . Depression   . Dizziness 1991   secondary to Meniere's disease  . Hyperlipidemia 2007  . Insomnia 1997  . Meniere's disease 1991  . Nervousness(799.21) 1987    Past Surgical History:  Procedure Laterality Date  . AXILLARY LYMPH NODE DISSECTION Left 08/16/2014   Procedure: AXILLARY LYMPH NODE DISSECTION;  Surgeon: Coralie Keens, MD;  Location: [place];  Service: General;  Laterality: Left;  . bilateral  tubal ligation  05/08/2004  . BREAST LUMPECTOMY WITH RADIOACTIVE SEED AND SENTINEL LYMPH NODE BIOPSY Left 08/08/2014   Procedure: BREAST LUMPECTOMY WITH RADIOACTIVE SEED AND SENTINEL LYMPH NODE BIOPSY;  Surgeon: Coralie Keens, MD;  Location: [place];  Service: General;  Laterality: Left;  . CHOLECYSTECTOMY  12/30/2016  . MASTOPEXY Right 12/14/2015   Procedure: RIGHT BREAST MASTOPEXY;  Surgeon: Irene Limbo, MD;  Location: [place];  Service: Plastics;  Laterality: Right;  . PORT-A-CATH REMOVAL N/A 03/08/2015   Procedure: REMOVAL PORT-A-CATH;  Surgeon: Coralie Keens, MD;  [facility];  Service: General;  Laterality: N/A;  .  PORTACATH PLACEMENT Right 08/16/2014   Procedure: INSERTION PORT-A-CATH;  Surgeon: Coralie Keens, MD;  Location: [place];  Service: General;  Laterality: Right;  . RE-EXCISION OF BREAST LUMPECTOMY Left 08/16/2014   Procedure: RE-EXCISION OF LEFT BREAST CANCER AND PORT PLACEMENT/POSSIBLE AXILLARY DISECTION;  Surgeon: Coralie Keens, MD;  Location: [place];  Service: General;  Laterality: Left;  . right breast surg     [place] Right 07/28/2012   Procedure: RIGHT SHOULDER DECOMPRESSION AND EXCISION OF CALCIFIC DEPOSIT;  Surgeon: Tobi Bastos, MD;  Location: [facility];  Service: Orthopedics;  Laterality: Right;  . TUBAL LIGATION    . uterine ablation  02/2006    Due to heavy periods Dr. Jon Billings   . wisdom teeth extractions  yrs ago    There were no vitals filed for this visit.  Subjective Assessment - 05/08/17 1115    Subjective  pt had good response with kinesiotape on foream and axilla; she took it off last night     Pertinent History  Left breast cancer with lumpectomy on 08/08/2014 with re-excision 08/16/2014 with total of 6 nodes removed. She has chemotherapy 09/04/2014- 01/15/2015 and radiation 01/29/2015-03/20/2015 She has a laparoscopic cholecystectomy 12/27/2016. right rotator cuff repair 07/28/2012    Patient Stated Goals  to get her arm "under control"     Currently in Pain?  Yes  Pain Score  2          [facility] PT Assessment - 05/08/17 0001      Observation/Other Assessments   Observations  pt will small areas of bruising on left forearm and she said she has some on legs too.  she thinks this from an "aspirin " clinical trial she is on.                  [facility] Adult PT Treatment/Exercise - 05/08/17 0001      Manual Therapy   Manual Therapy  Soft tissue mobilization;Myofascial release;Manual Lymphatic Drainage (MLD);Manual Traction    Manual therapy comments  extra time spent on right scar  tissue at lateral chest near deep incisoin.  Myofascial release applied to this area     Soft tissue mobilization  with pt in sidelying and with biotone soft tissue work to tight muscles in left lateral neck, upper trap, periscapular muscles and anterior chest.     Myofascial Release  prolonged presure to muliple tight trigger points in left lattissimus,lower, middle and upper trap, levator  lateral neck and subcapital musles.  Pt felt release with prolonged pressure and was participative with treament.     Manual Lymphatic Drainage (MLD)  In supine, short neck, shoulder collectors, superficial and deep abdominals, right axillary nodes, anterior interaxillary anastamosis, left inguinal nodes, left axillo-inguinal anastamosis, left anterior chest, axilla, upper arm to hand and return along pathways.  then to sidelying for posterior interaxillary anastamosis, and lateral chest and back     Kinesiotex  Edema      Kinesiotix   Edema  used 3 prongs on kinesiotape today form axlla to inguinal nodes and basketweave I band over puffy areas at axilla. Also applies a 4 pronged fan on left forearm to medical elbow               PT Short Term Goals - 04/21/17 1251      PT SHORT TERM GOAL #1   Title  Pt will verbalize knowledge of lymphedema risk reduction practices     Status  Achieved      PT SHORT TERM GOAL #2   Title  Pt will know how to get compression garments to manage trunk and arm swelling during airplane travel     Status  Achieved      PT SHORT TERM GOAL #3   Title  Pt will be independent in home exercise program for postural strengthening     Status  Achieved        PT Long Term Goals - 04/21/17 1252      PT LONG TERM GOAL #1   Title  Pt will report 75% improvment in her symptoms of left arm pain and swelling     Baseline  1/15 pt says she has improvement especially with the tape, but it comes back , 04/21/2016 pt reports 50% improvement     Time  8    Period  Weeks    Status   On-going      PT LONG TERM GOAL #2   Title  Pt will be knowldegable of the use of manual lymph drainage and compression to manage symptoms     Status  Achieved      PT LONG TERM GOAL #3   Title  Pt will be indepenent in a progressive resistance strengthening program to decrease the risk of lymphedema exacerbations.     Time  8    Period  Weeks  Status  On-going      PT LONG TERM GOAL #4   Title  Pt will report 50% decrease in pain from trigger points in left upper quadrant     Time  8    Status  New            Plan - 05/08/17 1312    Clinical Impression Statement  Pt felt a little sore after more aggressive myofascial work to left lateral chest, but therapist was able to access these deeper tissues today.  She has firm resistant scar tissue deep but was able to some release to the area with manual techniques.  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 focus myofascial releast to left anterior chest with scar mobilization, continue with MLD and ktape Assess effect of isometrics and progress to strength ABC program  Monitor arm lymphedema and use compression bandaging as indicated     Consulted and Agree with Plan of Care  Patient       Patient will benefit from skilled therapeutic intervention in order to improve the following deficits and impairments:  Decreased endurance, Decreased knowledge of precautions, Decreased scar mobility, Increased edema, Impaired UE functional use, Increased fascial restricitons, Decreased strength, Decreased knowledge of use of DME, Decreased activity tolerance, Pain, Impaired perceived functional ability, Postural dysfunction, Abnormal gait  Visit Diagnosis: Lymphedema, not elsewhere classified  Disorder of the skin  and subcutaneous tissue related to radiation, unspecified  Muscle weakness (generalized)  Abnormal posture     Problem List Patient Active Problem List   Diagnosis Date Noted  . Aortic atherosclerosis ([place]) 04/16/2017  . Nodule of upper lobe of right lung 04/16/2017  . Anxiety with depression 10/08/2016  . Insomnia 09/22/2016  . Bronchitis 05/26/2016  . Vitamin D deficiency 04/07/2016  . Easy bruising 03/20/2016  . 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 Anemia associated with chemotherapy 10/24/2014  . Malignant neoplasm of upper-outer quadrant of left breast in female, estrogen receptor positive ([place]) 07/31/2014  . Hyperlipidemia 09/06/2012  . Generalized anxiety disorder 09/06/2012  . Meniere's disease 09/06/2012  . Statin intolerance 09/06/2012  . Left rotator cuff tear 09/06/2012   Donato Heinz. Owens Shark PT  Norwood Levo 05/08/2017, 1:16 PM  [place], [place], [address] Phone: [phone]   Fax:  [phone]  Name: [name] MRN: [id number] Date of Birth: 09/22/1960

## 2017-05-14 ENCOUNTER — Ambulatory Visit: Payer: PRIVATE HEALTH INSURANCE | Admitting: Physical Therapy

## 2017-05-20 ENCOUNTER — Encounter: Payer: PRIVATE HEALTH INSURANCE | Admitting: Physical Therapy

## 2017-05-26 ENCOUNTER — Encounter: Payer: PRIVATE HEALTH INSURANCE | Admitting: Physical Therapy

## 2017-05-29 ENCOUNTER — Encounter: Payer: Self-pay | Admitting: Physical Therapy

## 2017-05-29 ENCOUNTER — Ambulatory Visit: Payer: PRIVATE HEALTH INSURANCE | Admitting: Physical Therapy

## 2017-05-29 DIAGNOSIS — L599 Disorder of the skin and subcutaneous tissue related to radiation, unspecified: Secondary | ICD-10-CM

## 2017-05-29 DIAGNOSIS — M6281 Muscle weakness (generalized): Secondary | ICD-10-CM

## 2017-05-29 DIAGNOSIS — R293 Abnormal posture: Secondary | ICD-10-CM | POA: Diagnosis present

## 2017-05-29 DIAGNOSIS — I890 Lymphedema, not elsewhere classified: Secondary | ICD-10-CM

## 2017-05-29 NOTE — Therapy (Signed)
[place], [place], [address] Phone: [phone]   Fax:  [phone]  Physical Therapy Treatment  Patient Details  Name: [name] MRN: [id number] Date of Birth: 1961/01/25 Referring Provider: Dr. Lindi Adie    Encounter Date: 05/29/2017  PT End of Session - 05/29/17 1225    Visit Number  12    Number of Visits  25    Date for PT Re-Evaluation  06/19/17    PT Start Time  2831    PT Stop Time  1105    PT Time Calculation (min)  50 min    Behavior During Therapy  [facility] for tasks assessed/performed       Past Medical History:  Diagnosis Date  . Abnormal Pap smear of cervix 2002   Dr. Eilleen Kempf   . Allergy 2012   seasonal  . Anxiety   . Benign hemangioma    Dr,  Marybelle Killings   . Breast cancer ([place]) 07/20/14   Left Breast  . Cervical stenosis (uterine cervix) 2002  . Depression   . Dizziness 1991   secondary to Meniere's disease  . Hyperlipidemia 2007  . Insomnia 1997  . Meniere's disease 1991  . Nervousness(799.21) 1987    Past Surgical History:  Procedure Laterality Date  . AXILLARY LYMPH NODE DISSECTION Left 08/16/2014   Procedure: AXILLARY LYMPH NODE DISSECTION;  Surgeon: Coralie Keens, MD;  Location: [place];  Service: General;  Laterality: Left;  . bilateral  tubal ligation  05/08/2004  . BREAST LUMPECTOMY WITH RADIOACTIVE SEED AND SENTINEL LYMPH NODE BIOPSY Left 08/08/2014   Procedure: BREAST LUMPECTOMY WITH RADIOACTIVE SEED AND SENTINEL LYMPH NODE BIOPSY;  Surgeon: Coralie Keens, MD;  Location: [place];  Service: General;  Laterality: Left;  . CHOLECYSTECTOMY  12/30/2016  . MASTOPEXY Right 12/14/2015   Procedure: RIGHT BREAST MASTOPEXY;  Surgeon: Irene Limbo, MD;  Location: [place];  Service: Plastics;  Laterality: Right;  . PORT-A-CATH REMOVAL N/A 03/08/2015   Procedure: REMOVAL PORT-A-CATH;  Surgeon: Coralie Keens, MD;   Location: [facility];  Service: General;  Laterality: N/A;  . PORTACATH PLACEMENT Right 08/16/2014   Procedure: INSERTION PORT-A-CATH;  Surgeon: Coralie Keens, MD;  Location: [place];  Service: General;  Laterality: Right;  . RE-EXCISION OF BREAST LUMPECTOMY Left 08/16/2014   Procedure: RE-EXCISION OF LEFT BREAST CANCER AND PORT PLACEMENT/POSSIBLE AXILLARY DISECTION;  Surgeon: Coralie Keens, MD;  Location: [place];  Service: General;  Laterality: Left;  . right breast surg     [place] Right 07/28/2012   Procedure: RIGHT SHOULDER DECOMPRESSION AND EXCISION OF CALCIFIC DEPOSIT;  Surgeon: Tobi Bastos, MD;  Location[facility];  Service: Orthopedics;  Laterality: Right;  . TUBAL LIGATION    . uterine ablation  02/2006    Due to heavy periods Dr. Jon Billings   . wisdom teeth extractions  yrs ago    There were no vitals filed for this visit.  Subjective Assessment - 05/29/17 1220    Subjective  Pt reports she is not doing well.  She has been travelling alot for work and has been struggling with a migraine.  She reports she is having alot of stress.  She feels lots of fullness under her arm.  She has been struggling to lose weight     Pertinent History  Left breast cancer with lumpectomy on 08/08/2014 with re-excision 08/16/2014 with total of 6  nodes removed. She has chemotherapy 09/04/2014- 01/15/2015 and radiation 01/29/2015-03/20/2015 She has a laparoscopic cholecystectomy 12/27/2016. right rotator cuff repair 07/28/2012    Patient Stated Goals  to get her arm "under control"     Currently in Pain?  Yes    Pain Score  -- did not rate                 No data recorded       [facility] Adult PT Treatment/Exercise - 05/29/17 0001      Manual Therapy   Manual Therapy  Soft tissue mobilization;Myofascial release    Manual therapy comments  instructed pt in self myofascial release to tight pec major with tennis ball  rolling.  Also gave her a chip pack to wear at axilla held in place with a tight shirt     Soft tissue mobilization  with pt in sidelying and with biotone soft tissue work to tight muscles in left lateral neck, upper trap, periscapular muscles and anterior chest.     Myofascial Release  with crossed hands at lateral chest around scars wtih release felt      Manual Lymphatic Drainage (MLD)  In supine, short neck, shoulder collectors, superficial and deep abdominals, right axillary nodes, anterior interaxillary anastamosis, left inguinal nodes, left axillo-inguinal anastamosis, left anterior chest, axilla, upper arm to hand and return along pathways.  then to sidelying for posterior interaxillary anastamosis, and lateral chest and back     Kinesiotex  Edema      Kinesiotix   Edema  basketweave I band over puffy areas at axilla and contracted scar, 4 pronged piece from abdomen to left axilla l                PT Short Term Goals - 04/21/17 1251      PT SHORT TERM GOAL #1   Title  Pt will verbalize knowledge of lymphedema risk reduction practices     Status  Achieved      PT SHORT TERM GOAL #2   Title  Pt will know how to get compression garments to manage trunk and arm swelling during airplane travel     Status  Achieved      PT SHORT TERM GOAL #3   Title  Pt will be independent in home exercise program for postural strengthening     Status  Achieved        PT Long Term Goals - 05/29/17 1227      PT LONG TERM GOAL #1   Title  Pt will report 75% improvment in her symptoms of left arm pain and swelling     Baseline  1/15 pt says she has improvement especially with the tape, but it comes back , 04/21/2016 pt reports 50% improvement       PT LONG TERM GOAL #2   Title  Pt will be knowldegable of the use of manual lymph drainage and compression to manage symptoms     Status  Achieved      PT LONG TERM GOAL #3   Title  Pt will be indepenent in a progressive resistance strengthening  program to decrease the risk of lymphedema exacerbations.     Status  On-going      PT LONG TERM GOAL #4   Title  Pt will report 50% decrease in pain from trigger points in left upper quadrant     Status  On-going            Plan -  05/29/17 1225    Clinical Impression Statement  Pt continues to stuggle with pain and tightness in left anterior chest with contraction at sites of scar, especally at  axilla with thick fullness in axilla.  Added chip pack for this area today and reapplied Kinesiotape as pt gets relief from it.  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 focus myofascial releast to left anterior chest with scar mobilization, continue with MLD and ktape Assess effect of isometrics and progress to strength ABC program  Monitor arm lymphedema and use compression bandaging as indicated        Patient will benefit from skilled therapeutic intervention in order to improve the following deficits and impairments:  Decreased endurance, Decreased knowledge of precautions, Decreased scar mobility, Increased edema, Impaired UE functional use, Increased fascial restricitons, Decreased strength, Decreased knowledge of use of DME, Decreased activity tolerance, Pain, Impaired perceived functional ability, Postural dysfunction, Abnormal gait  Visit Diagnosis: Lymphedema, not elsewhere classified  Disorder of the skin and subcutaneous tissue related to radiation,  unspecified  Muscle weakness (generalized)  Abnormal posture     Problem List Patient Active Problem List   Diagnosis Date Noted  . Aortic atherosclerosis ([place]) 04/16/2017  . Nodule of upper lobe of right lung 04/16/2017  . Anxiety with depression 10/08/2016  . Insomnia 09/22/2016  . Bronchitis 05/26/2016  . Vitamin D deficiency 04/07/2016  . Easy bruising 03/20/2016  . 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 Anemia associated with chemotherapy 10/24/2014  . Malignant neoplasm of upper-outer quadrant of left breast in female, estrogen receptor positive ([place]) 07/31/2014  . Hyperlipidemia 09/06/2012  . Generalized anxiety disorder 09/06/2012  . Meniere's disease 09/06/2012  . Statin intolerance 09/06/2012  . Left rotator cuff tear 09/06/2012   Donato Heinz. Owens Shark PT  Norwood Levo 05/29/2017, 12:28 PM  [place], [place], [address] Phone: [phone]   Fax:  [phone]  Name: [name] MRN: [id number] Date of Birth: 06/15/1960

## 2017-06-02 ENCOUNTER — Ambulatory Visit: Payer: PRIVATE HEALTH INSURANCE | Admitting: Physical Therapy

## 2017-06-05 ENCOUNTER — Encounter: Payer: PRIVATE HEALTH INSURANCE | Admitting: Physical Therapy

## 2017-06-09 ENCOUNTER — Ambulatory Visit: Payer: PRIVATE HEALTH INSURANCE | Admitting: Physical Therapy

## 2017-06-12 ENCOUNTER — Encounter: Payer: PRIVATE HEALTH INSURANCE | Admitting: Physical Therapy

## 2017-06-14 NOTE — Progress Notes (Signed)
Cardiology Office Note   Date:  06/17/2017   ID:  [name], DOB March 25, 1960, MRN [id number]  PCP:  Chipper Herb, MD  Cardiologist:   No primary care provider on file. Referring:  Chipper Herb, MD  Chief Complaint  Patient presents with  . Aortic Atherosclerosis      History of Present Illness: [name] is a 57 y.o. female who is referred by Dr. Laurance Flatten for evaluation of aortic atherosclerosis.  this was noted on a CT in 2018.     She has no prior cardiac history. She had a normal echo in 2016.  This was a follow-up of chemotherapy for breast cancer.  She is very active.  She walks on a treadmill.  She has a 3 story house.  She did a 5K walk/run.  She has no cardiovascular symptoms.   The patient denies any new symptoms such as chest discomfort, neck or arm discomfort. There has been no new shortness of breath, PND or orthopnea. There have been no reported palpitations, presyncope or syncope.   Past Medical History:  Diagnosis Date  . Abnormal Pap smear of cervix 2002   Dr. Eilleen Kempf   . Allergy 2012   seasonal  . Anxiety   . Benign hemangioma    Dr,  Marybelle Killings   . Breast cancer ([place]) 07/20/14   Left Breast  . Cervical stenosis (uterine cervix) 2002  . Depression   . Dizziness 1991   secondary to Meniere's disease  . Hyperlipidemia 2007  . Insomnia 1997  . Meniere's disease 1991  . Nervousness(799.21) 1987    Past Surgical History:  Procedure Laterality Date  . AXILLARY LYMPH NODE DISSECTION Left 08/16/2014   Procedure: AXILLARY LYMPH NODE DISSECTION;  Surgeon: Coralie Keens, MD;  Location: [place];  Service: General;  Laterality: Left;  . bilateral  tubal ligation  05/08/2004  . BREAST LUMPECTOMY WITH RADIOACTIVE SEED AND SENTINEL LYMPH NODE BIOPSY Left 08/08/2014   Procedure: BREAST LUMPECTOMY WITH RADIOACTIVE SEED AND SENTINEL LYMPH NODE BIOPSY;  Surgeon: Coralie Keens, MD;  Location: [place];  Service:  General;  Laterality: Left;  . CHOLECYSTECTOMY  12/30/2016  . MASTOPEXY Right 12/14/2015   Procedure: RIGHT BREAST MASTOPEXY;  Surgeon: Irene Limbo, MD;  Location: [place];  Service: Plastics;  Laterality: Right;  . PORT-A-CATH REMOVAL N/A 03/08/2015   Procedure: REMOVAL PORT-A-CATH;  Surgeon: Coralie Keens, MD;  [facility];  Service: General;  Laterality: N/A;  . PORTACATH PLACEMENT Right 08/16/2014   Procedure: INSERTION PORT-A-CATH;  Surgeon: Coralie Keens, MD;  Location: [place];  Service: General;  Laterality: Right;  . RE-EXCISION OF BREAST LUMPECTOMY Left 08/16/2014   Procedure: RE-EXCISION OF LEFT BREAST CANCER AND PORT PLACEMENT/POSSIBLE AXILLARY DISECTION;  Surgeon: Coralie Keens, MD;  Location: [place];  Service: General;  Laterality: Left;  . right breast surg     [place] Right 07/28/2012   Procedure: RIGHT SHOULDER DECOMPRESSION AND EXCISION OF CALCIFIC DEPOSIT;  Surgeon: Tobi Bastos, MD;  [facility];  Service: Orthopedics;  Laterality: Right;  . TUBAL LIGATION    . uterine ablation  02/2006    Due to heavy periods Dr. Jon Billings   . wisdom teeth extractions  yrs ago     Current Outpatient Medications  Medication Sig Dispense Refill  . acetaminophen (TYLENOL) 325 MG tablet Take 975 mg by mouth daily as needed  for moderate pain. Usually takes twice daily in morning and bedtime and additionally during the day if needed for pain    . anastrozole (ARIMIDEX) 1 MG tablet TAKE 1 TABLET EVERY DAY 90 tablet 2  . Biotin 1000 MCG tablet Take 1,000 mcg by mouth daily.    . clonazePAM (KLONOPIN) 0.5 MG tablet TAKE 1 TABLET BY MOUTH THREE TIMES A DAY AS NEEDED 90 tablet 1  . donepezil (ARICEPT) 10 MG tablet TAKE 1 TABLET (10 MG TOTAL) BY MOUTH AT BEDTIME. 90 tablet 3  . Investigational aspirin/placebo 100 MG tablet Alliance [id number] Take 1 tablet by mouth daily. Take with  food or a full glass of water. Do not crush enteric coated tablets. 180 tablet 0  . [START ON 06/20/2017] Investigational aspirin/placebo 100 MG tablet Alliance [id number] Take 1 tablet by mouth daily. Take with food or a full glass of water. Do not crush enteric coated tablets. 90 tablet 0  . LORazepam (ATIVAN) 1 MG tablet TAKE 1 TABLET BY MOUTH AT BEDTIME 30 tablet 0  . PARoxetine (PAXIL-CR) 25 MG 24 hr tablet Take 1 tablet (25 mg total) by mouth daily. 90 tablet 3  . rosuvastatin (CRESTOR) 10 MG tablet Take 1 tablet (10 mg total) by mouth daily. 90 tablet 3  . Turmeric 500 MG TABS Take 1 tablet by mouth daily.    . Vitamin D, Ergocalciferol, (DRISDOL) 50000 units CAPS capsule TAKE 1 CAPSULE (50,000 UNITS TOTAL) BY MOUTH EVERY 7 (SEVEN) DAYS. 12 capsule 3   No current facility-administered medications for this visit.     Allergies:   Statins; Crestor [rosuvastatin]; Lipitor [atorvastatin]; and Temazepam    Social History:  The patient  reports that she has never smoked. She has never used smokeless tobacco. She reports that she drinks alcohol. She reports that she does not use drugs.   Family History:  The patient's family history includes Cirrhosis in her father; Colon polyps in her mother.      ROS:  Please see the history of present illness.   Otherwise, review of systems are positive for none.   All other systems are reviewed and negative.    PHYSICAL EXAM: VS:  BP 120/87   Pulse 66   Ht 5\' 5"  (1.651 m)   Wt 163 lb (73.9 kg)   BMI 27.12 kg/m  , BMI Body mass index is 27.12 kg/m. GENERAL:  Well appearing HEENT:  Pupils equal round and reactive, fundi not visualized, oral mucosa unremarkable NECK:  No jugular venous distention, waveform within normal limits, carotid upstroke brisk and symmetric, no bruits, no thyromegaly LYMPHATICS:  No cervical, inguinal adenopathy LUNGS:  Clear to auscultation bilaterally BACK:  No CVA tenderness CHEST:  Unremarkable HEART:  PMI not displaced  or sustained,S1 and S2 within normal limits, no S3, no S4, no clicks, no rubs, did have an uncle who died suddenly of an MI in his 28s.  Murmurs ABD:  Flat, positive bowel sounds normal in frequency in pitch, no bruits, no rebound, no guarding, no midline pulsatile mass, no hepatomegaly, no splenomegaly EXT:  2 plus pulses throughout, no edema, no cyanosis no clubbing SKIN:  No rashes no nodules NEURO:  Cranial nerves II through XII grossly intact, motor grossly intact throughout PSYCH:  Cognitively intact, oriented to person place and time    EKG:  EKG is ordered today. The ekg ordered today demonstrates sinus rhythm, rate 65, axis within normal limits, intervals within normal normal limits, nonspecific T wave flattening.  Recent Labs: 04/16/2017: ALT 24; BUN 10; Creatinine, Ser 0.48; Hemoglobin 13.8; Platelets 162; Potassium 4.2; Sodium 142    Lipid Panel    Component Value Date/Time   CHOL 133 04/16/2017 0936   CHOL 230 (H) 10/04/2012 1123   TRIG 110 04/16/2017 0936   TRIG 98 07/21/2013 1023   TRIG 231 (H) 10/04/2012 1123   HDL 56 04/16/2017 0936   HDL 66 07/21/2013 1023   HDL 50 10/04/2012 1123   CHOLHDL 2.4 04/16/2017 0936   LDLCALC 55 04/16/2017 0936   LDLCALC 121 (H) 07/21/2013 1023   LDLCALC 134 (H) 10/04/2012 1123      Wt Readings from Last 3 Encounters:  06/17/17 163 lb (73.9 kg)  04/16/17 164 lb (74.4 kg)  03/09/17 165 lb 6.4 oz (75 kg)      Other studies Reviewed: Additional studies/ records that were reviewed today include: Labs. Review of the above records demonstrates:  Please see elsewhere in the note.     ASSESSMENT AND PLAN:  AORTIC ATHEROSCLEROSIS.     She does have some aortic atherosclerosis but I would not suspect obstructive coronary disease.  At this point I do not think further screening is indicated as she has no high risk risk factors to address differently.  We talked about continued diet and exercise.  No change in therapy is  indicated.  DYSLIPIDEMIA: Her LDL was 55 with HDL 56.  No change in therapy.     Current medicines are reviewed at length with the patient today.  The patient does not have concerns regarding medicines.  The following changes have been made:  no change  Labs/ tests ordered today include: None  Orders Placed This Encounter  Procedures  . EKG 12-Lead     Disposition:   FU with me as needed.     Signed, Minus Breeding, MD  06/17/2017 10:24 AM    [facility]

## 2017-06-16 ENCOUNTER — Ambulatory Visit: Payer: PRIVATE HEALTH INSURANCE | Attending: Hematology and Oncology | Admitting: Physical Therapy

## 2017-06-16 ENCOUNTER — Encounter: Payer: Self-pay | Admitting: Physical Therapy

## 2017-06-16 DIAGNOSIS — R293 Abnormal posture: Secondary | ICD-10-CM | POA: Insufficient documentation

## 2017-06-16 DIAGNOSIS — L599 Disorder of the skin and subcutaneous tissue related to radiation, unspecified: Secondary | ICD-10-CM | POA: Insufficient documentation

## 2017-06-16 DIAGNOSIS — I890 Lymphedema, not elsewhere classified: Secondary | ICD-10-CM | POA: Diagnosis present

## 2017-06-16 DIAGNOSIS — M6281 Muscle weakness (generalized): Secondary | ICD-10-CM | POA: Insufficient documentation

## 2017-06-16 NOTE — Patient Instructions (Signed)
STEP 1 STEP 2 Dynamic Hug REPS: 10  SETS: 3  WEEKLY: 7x  DAILY: 1x STEP 1 STEP 2 Push Up with Plus REPS: 10  SETS: 3  WEEKLY: 7x  DAILY: 1x STEP 1 STEP 2 Dynamic Hug with Resistance REPS: 10  SETS: 3  WEEKLY: 7x  DAILY: 1x STEP 1 STEP 2 Supine Resisted Serratus Anterior Punches with Elbow Bent REPS: 10  SETS: 3  WEEKLY: 7x  DAILY: 1x Prepared by Maudry Diego Access your exercises! [email or web address] [name] Your Access Code: [email or web address] Disclaimer: This program provides exercises related to your condition that you can perform at home. As there is a risk of injury with any activity, use caution when performing exercises. If you experience any pain or discomfort, discontinue the exercises and contact your health care provider. Page 1 of 1 06/16/2017

## 2017-06-16 NOTE — Therapy (Signed)
[place], [place], [address] Phone: [phone]   Fax:  [phone]  Physical Therapy Treatment  Patient Details  Name: [name] MRN: [id number] Date of Birth: October 23, 1960 Referring Provider: Dr. Lindi Adie    Encounter Date: 06/16/2017  PT End of Session - 06/16/17 1303    Visit Number  13    Number of Visits  25    Date for PT Re-Evaluation  06/19/17    PT Start Time  7124    PT Stop Time  1100    PT Time Calculation (min)  45 min    Activity Tolerance  Patient tolerated treatment well    Behavior During Therapy  [facility] for tasks assessed/performed       Past Medical History:  Diagnosis Date  . Abnormal Pap smear of cervix 2002   Dr. Eilleen Kempf   . Allergy 2012   seasonal  . Anxiety   . Benign hemangioma    Dr,  Marybelle Killings   . Breast cancer ([place]) 07/20/14   Left Breast  . Cervical stenosis (uterine cervix) 2002  . Depression   . Dizziness 1991   secondary to Meniere's disease  . Hyperlipidemia 2007  . Insomnia 1997  . Meniere's disease 1991  . Nervousness(799.21) 1987    Past Surgical History:  Procedure Laterality Date  . AXILLARY LYMPH NODE DISSECTION Left 08/16/2014   Procedure: AXILLARY LYMPH NODE DISSECTION;  Surgeon: Coralie Keens, MD;  Location: [place];  Service: General;  Laterality: Left;  . bilateral  tubal ligation  05/08/2004  . BREAST LUMPECTOMY WITH RADIOACTIVE SEED AND SENTINEL LYMPH NODE BIOPSY Left 08/08/2014   Procedure: BREAST LUMPECTOMY WITH RADIOACTIVE SEED AND SENTINEL LYMPH NODE BIOPSY;  Surgeon: Coralie Keens, MD;  Location: [place];  Service: General;  Laterality: Left;  . CHOLECYSTECTOMY  12/30/2016  . MASTOPEXY Right 12/14/2015   Procedure: RIGHT BREAST MASTOPEXY;  Surgeon: Irene Limbo, MD;  Location: [place];  Service: Plastics;  Laterality: Right;  . PORT-A-CATH REMOVAL N/A 03/08/2015   Procedure: REMOVAL PORT-A-CATH;  Surgeon: Coralie Keens, MD;  [facility];  Service: General;  Laterality: N/A;  . PORTACATH PLACEMENT Right 08/16/2014   Procedure: INSERTION PORT-A-CATH;  Surgeon: Coralie Keens, MD;  Location: [place];  Service: General;  Laterality: Right;  . RE-EXCISION OF BREAST LUMPECTOMY Left 08/16/2014   Procedure: RE-EXCISION OF LEFT BREAST CANCER AND PORT PLACEMENT/POSSIBLE AXILLARY DISECTION;  Surgeon: Coralie Keens, MD;  Location: [place];  Service: General;  Laterality: Left;  . right breast surg     [place] Right 07/28/2012   Procedure: RIGHT SHOULDER DECOMPRESSION AND EXCISION OF CALCIFIC DEPOSIT;  Surgeon: Tobi Bastos, MD;  Location[facility];  Service: Orthopedics;  Laterality: Right;  . TUBAL LIGATION    . uterine ablation  02/2006    Due to heavy periods Dr. Jon Billings   . wisdom teeth extractions  yrs ago    There were no vitals filed for this visit.  Subjective Assessment - 06/16/17 1257    Subjective  Pt reports she has been very busy at work.  She says she is having pain in her her axilla and side     Pertinent History  Left breast cancer with lumpectomy on 08/08/2014 with re-excision 08/16/2014 with total of 6 nodes removed. She has chemotherapy 09/04/2014- 01/15/2015 and radiation 01/29/2015-03/20/2015 She has a laparoscopic cholecystectomy 12/27/2016. right  rotator cuff repair 07/28/2012    Patient Stated Goals  to get her arm "under control"     Currently in Pain?  Yes    Pain Score  7     Pain Location  Axilla    Pain Orientation  Left    Pain Descriptors / Indicators  Sharp    Pain Type  Chronic pain    Pain Radiating Towards  to lower arm which is tingling     Pain Onset  More than a month ago    Pain Frequency  Constant                       [facility] Adult PT Treatment/Exercise - 06/16/17 0001      Self-Care   Other Self-Care Comments    compression at axilla was not beneficial to patient       Shoulder Exercises: Supine   Other Supine Exercises  dowel rod flexion with cues for shoulder and lateral chest stretch       Shoulder Exercises: Standing   Protraction  Strengthening;Right;Left;10 reps    Protraction Limitations  need multiple repetitions with multi modal cues for pt to accurately perform the exercise and achieve scapular excursion for strength an strech     Flexion  AROM;Strengthening;Both;10 reps    Flexion Limitations  cue cues for scapualar protraciton : performed both without and with red theraband     Other Standing Exercises  countertop push ups and weight bearing for bone strength       Manual Therapy   Manual Lymphatic Drainage (MLD)  In supine, short neck, shoulder collectors, superficial and deep abdominals, right axillary nodes, anterior interaxillary anastamosis, left inguinal nodes, left axillo-inguinal anastamosis, left anterior chest, axilla, upper arm to hand and return along pathways.  then to sidelying for posterior interaxillary anastamosis, and lateral chest and back     Kinesiotex  Edema      Kinesiotix   Edema  basketweave I band over puffy areas at axilla and contracted scar, 4 pronged piece from abdomen to left axilla l              PT Education - 06/16/17 1302    Education provided  Yes    Education Details  scapular strengthening.     Person(s) Educated  Patient    Methods  Explanation;Demonstration;Handout    Comprehension  Verbalized understanding;Returned demonstration       PT Short Term Goals - 04/21/17 1251      PT SHORT TERM GOAL #1   Title  Pt will verbalize knowledge of lymphedema risk reduction practices     Status  Achieved      PT SHORT TERM GOAL #2   Title  Pt will know how to get compression garments to manage trunk and arm swelling during airplane travel     Status  Achieved      PT SHORT TERM GOAL #3   Title  Pt will be independent in home exercise  program for postural strengthening     Status  Achieved        PT Long Term Goals - 05/29/17 1227      PT LONG TERM GOAL #1   Title  Pt will report 75% improvment in her symptoms of left arm pain and swelling     Baseline  1/15 pt says she has improvement especially with the tape, but it comes back , 04/21/2016 pt reports 50% improvement  PT LONG TERM GOAL #2   Title  Pt will be knowldegable of the use of manual lymph drainage and compression to manage symptoms     Status  Achieved      PT LONG TERM GOAL #3   Title  Pt will be indepenent in a progressive resistance strengthening program to decrease the risk of lymphedema exacerbations.     Status  On-going      PT LONG TERM GOAL #4   Title  Pt will report 50% decrease in pain from trigger points in left upper quadrant     Status  On-going            Plan - 06/16/17 1303    Clinical Impression Statement  Focus today on prgressive serratus anterior strengthening and stretching that pt will be able to follow through with at home.  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     PT Frequency  2x / week    PT Duration  8 weeks    PT Treatment/Interventions  Manual lymph drainage;ADLs/Self Care Home Management;Compression bandaging;Scar mobilization;Passive range of motion;Taping;Manual techniques;Therapeutic exercise;Therapeutic activities;Orthotic Fit/Training;DME Instruction;Dry needling    PT Next Visit Plan  Assess goals Review scapular exercises and teach strength ABC program     Consulted and Agree with Plan of Care  Patient       Patient will benefit from skilled therapeutic intervention in order to improve the following deficits and impairments:  Decreased endurance, Decreased knowledge of precautions, Decreased scar mobility, Increased edema, Impaired UE  functional use, Increased fascial restricitons, Decreased strength, Decreased knowledge of use of DME, Decreased activity tolerance, Pain, Impaired perceived functional ability, Postural dysfunction, Abnormal gait  Visit Diagnosis: Lymphedema, not elsewhere classified  Disorder of the skin and subcutaneous tissue related to radiation, unspecified  Muscle weakness (generalized)  Abnormal posture     Problem List Patient Active Problem List   Diagnosis Date Noted  . Aortic atherosclerosis ([place]) 04/16/2017  . Nodule of upper lobe of right lung 04/16/2017  . Anxiety with depression 10/08/2016  . Insomnia 09/22/2016  . Bronchitis 05/26/2016  . Vitamin D deficiency 04/07/2016  . Easy bruising 03/20/2016  . 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 Anemia associated with chemotherapy 10/24/2014  . Malignant neoplasm of upper-outer quadrant of left breast in female, estrogen receptor positive ([place]) 07/31/2014  . Hyperlipidemia 09/06/2012  . Generalized anxiety disorder 09/06/2012  . Meniere's disease 09/06/2012  . Statin intolerance 09/06/2012  . Left rotator cuff tear 09/06/2012   Donato Heinz. Owens Shark PT  Norwood Levo 06/16/2017, 1:07 PM  [place], [place], [address] Phone: [phone]   Fax:  [phone]  Name: KRYSTOL ROCCO MRN: [id number] Date of Birth: 14-May-1960

## 2017-06-17 ENCOUNTER — Ambulatory Visit (INDEPENDENT_AMBULATORY_CARE_PROVIDER_SITE_OTHER): Payer: PRIVATE HEALTH INSURANCE | Admitting: Cardiology

## 2017-06-17 ENCOUNTER — Encounter: Payer: Self-pay | Admitting: Cardiology

## 2017-06-17 VITALS — BP 120/87 | HR 66 | Ht 65.0 in | Wt 163.0 lb

## 2017-06-17 DIAGNOSIS — E785 Hyperlipidemia, unspecified: Secondary | ICD-10-CM | POA: Diagnosis not present

## 2017-06-17 DIAGNOSIS — I700 Atherosclerosis of aorta: Secondary | ICD-10-CM

## 2017-06-17 NOTE — Patient Instructions (Signed)
Medication Instructions:  The current medical regimen is effective;  continue present plan and medications.  Follow-Up: Follow up as needed with Dr Hochrein.  Thank you for choosing [facility]!!     

## 2017-06-18 ENCOUNTER — Ambulatory Visit: Payer: PRIVATE HEALTH INSURANCE | Admitting: Physical Therapy

## 2017-06-18 ENCOUNTER — Encounter: Payer: Self-pay | Admitting: Physical Therapy

## 2017-06-18 DIAGNOSIS — I890 Lymphedema, not elsewhere classified: Secondary | ICD-10-CM | POA: Diagnosis not present

## 2017-06-18 DIAGNOSIS — R293 Abnormal posture: Secondary | ICD-10-CM

## 2017-06-18 DIAGNOSIS — L599 Disorder of the skin and subcutaneous tissue related to radiation, unspecified: Secondary | ICD-10-CM

## 2017-06-18 DIAGNOSIS — M6281 Muscle weakness (generalized): Secondary | ICD-10-CM

## 2017-06-18 NOTE — Therapy (Signed)
[place], [place], [address] Phone: [phone]   Fax:  [phone]  Physical Therapy Treatment  Patient Details  Name: [name] MRN: [id number] Date of Birth: [birth year] Referring Provider: Dr. Lindi Adie    Encounter Date: 06/18/2017  PT End of Session - 06/18/17 1252    Visit Number  14    Number of Visits  33    Date for PT Re-Evaluation  09/17/17    PT Start Time  1105    PT Stop Time  1152    PT Time Calculation (min)  47 min    Activity Tolerance  Patient tolerated treatment well    Behavior During Therapy  [facility] for tasks assessed/performed       Past Medical History:  Diagnosis Date  . Abnormal Pap smear of cervix 2002   Dr. Eilleen Kempf   . Allergy 2012   seasonal  . Anxiety   . Benign hemangioma    Dr,  Marybelle Killings   . Breast cancer ([place]) 07/20/14   Left Breast  . Cervical stenosis (uterine cervix) 2002  . Depression   . Dizziness 1991   secondary to Meniere's disease  . Hyperlipidemia 2007  . Insomnia 1997  . Meniere's disease 1991  . Nervousness(799.21) 1987    Past Surgical History:  Procedure Laterality Date  . AXILLARY LYMPH NODE DISSECTION Left 08/16/2014   Procedure: AXILLARY LYMPH NODE DISSECTION;  Surgeon: Coralie Keens, MD;  Location: [place];  Service: General;  Laterality: Left;  . bilateral  tubal ligation  05/08/2004  . BREAST LUMPECTOMY WITH RADIOACTIVE SEED AND SENTINEL LYMPH NODE BIOPSY Left 08/08/2014   Procedure: BREAST LUMPECTOMY WITH RADIOACTIVE SEED AND SENTINEL LYMPH NODE BIOPSY;  Surgeon: Coralie Keens, MD;  Location: [place];  Service: General;  Laterality: Left;  . CHOLECYSTECTOMY  12/30/2016  . MASTOPEXY Right 12/14/2015   Procedure: RIGHT BREAST MASTOPEXY;  Surgeon: Irene Limbo, MD;  Location: [place];  Service: Plastics;  Laterality: Right;  . PORT-A-CATH REMOVAL N/A 03/08/2015    Procedure: REMOVAL PORT-A-CATH;  Surgeon: Coralie Keens, MD;  [facility];  Service: General;  Laterality: N/A;  . PORTACATH PLACEMENT Right 08/16/2014   Procedure: INSERTION PORT-A-CATH;  Surgeon: Coralie Keens, MD;  Location: [place];  Service: General;  Laterality: Right;  . RE-EXCISION OF BREAST LUMPECTOMY Left 08/16/2014   Procedure: RE-EXCISION OF LEFT BREAST CANCER AND PORT PLACEMENT/POSSIBLE AXILLARY DISECTION;  Surgeon: Coralie Keens, MD;  Location: [place];  Service: General;  Laterality: Left;  . right breast surg     [place] Right 07/28/2012   Procedure: RIGHT SHOULDER DECOMPRESSION AND EXCISION OF CALCIFIC DEPOSIT;  Surgeon: Tobi Bastos, MD;  [facility];  Service: Orthopedics;  Laterality: Right;  . TUBAL LIGATION    . uterine ablation  02/2006    Due to heavy periods Dr. Jon Billings   . wisdom teeth extractions  yrs ago    There were no vitals filed for this visit.  Subjective Assessment - 06/18/17 1245    Subjective  Pt states she was a littel sore after last session but feels that the new stretches are reaching her lateral trunk and areas in a way that they had not before     Patient Stated Goals  to get her arm "under control"     Currently in Pain?  Yes    Pain Score  --  did not rate , but feels is at baseline "sore"     Pain Location  Axilla    Pain Orientation  Left    Pain Descriptors / Indicators  Sore    Pain Type  Chronic pain                       [facility] Adult PT Treatment/Exercise - 06/18/17 0001      Exercises   Exercises  Other Exercises    Other Exercises   reviewed Strenth ABC packet of exercise and practiced chest stretches at wall and modified downward dog       Shoulder Exercises: Stretch   Other Shoulder Stretches  supine on foam roller with arms out wide at "T" position and also at "Y" position.       Manual Therapy   Manual Therapy  Soft  tissue mobilization;Myofascial release;Manual Lymphatic Drainage (MLD)    Manual therapy comments  instructed pt to do self myofascial release with tennis ball in sidelying at lateral chest and also instanding with ball on yoga block for concentrated pressure at pec majoy and pec minor insertions     Myofascial Release  prolonged pressure at trigger points at [facility] and minor and along lateral chest wall between ribs     Manual Lymphatic Drainage (MLD)  Briefly, In supine, short neck, shoulder collectors, superficial and deep abdominals, right axillary nodes, anterior interaxillary anastamosis, left inguinal nodes, left axillo-inguinal anastamosis, left anterior chest, axilla, upper arm to hand and return along pathways.  then to sidelying for posterior interaxillary anastamosis, and lateral chest and back                PT Short Term Goals - 06/18/17 1258      PT SHORT TERM GOAL #1   Title  Pt will verbalize knowledge of lymphedema risk reduction practices     Status  Achieved      PT SHORT TERM GOAL #2   Title  Pt will know how to get compression garments to manage trunk and arm swelling during airplane travel     Status  Achieved      PT SHORT TERM GOAL #3   Title  Pt will be independent in home exercise program for postural strengthening     Status  Achieved        PT Long Term Goals - 06/18/17 1258      PT LONG TERM GOAL #1   Title  Pt will report 75% improvment in her symptoms of left arm pain and swelling     Baseline  1/15 pt says she has improvement especially with the tape, but it comes back , 04/21/2016 pt reports 50% improvement , 06/18/2017, pt has not achieved 75% improvment yet     Time  12    Period  Weeks    Status  On-going      PT LONG TERM GOAL #2   Title  Pt will be knowldegable of the use of manual lymph drainage and compression to manage symptoms     Status  Achieved      PT LONG TERM GOAL #3   Title  Pt will be indepenent in a progressive  resistance strengthening program to decrease the risk of lymphedema exacerbations.     Time  12    Status  On-going      PT LONG TERM GOAL #4   Title  Pt will report 50% decrease in pain  from trigger points in left upper quadrant     Time  12    Period  Weeks    Status  On-going      PT LONG TERM GOAL #5   Title  Family will be able to assist pt with exercise and manual techniques for myofascial work for    Time  12    [place] - 06/18/17 1252    Clinical Impression Statement  Pt is making progress with her exercises at home and is improving her function with activites.  She still has significant soft tissue tightness and trigger points in left anterior chest, around scars and axilla and posterior axilla, lateral chest. Her husband has been instructed in manaul lymph draiange and kinesiotaping, ( it was not reapplied today as pt has some redness leftover on skin from last application ) Pt is responding to deep myofascial work and wants to continue and teach techniques to family for ongoing benefit.  She needs continued upgrades to exercise progression for bone strength ( she is on anastrozole) and increase Upper body stregnth for protection from lymphedma. 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  Continue myofascial work to left axillary and chest area.  Teach family tecniques     PT Home Exercise Plan  Meeks decompression, supine scapular series, shoulder isometrics ,lower trunk rotation ,  Strength ABC program     Consulted and Agree with Plan of Care  Patient       Patient will benefit from skilled therapeutic intervention in order to improve the following deficits and impairments:  Decreased endurance, Decreased knowledge of precautions, Decreased scar mobility, Increased edema, Impaired UE functional use, Increased fascial restricitons, Decreased strength, Decreased knowledge of use of DME, Decreased activity tolerance, Pain, Impaired perceived functional ability, Postural dysfunction, Abnormal gait  Visit Diagnosis: Lymphedema, not elsewhere classified - Plan: PT plan of care cert/re-cert  Disorder of the skin and subcutaneous tissue related to radiation, unspecified - Plan: PT plan of care cert/re-cert  Muscle weakness (generalized) - Plan: PT plan of care cert/re-cert  Abnormal posture - Plan: PT plan of care cert/re-cert     Problem List Patient Active Problem List   Diagnosis Date Noted  . Aortic atherosclerosis ([place]) 04/16/2017  . Nodule of upper lobe of right lung 04/16/2017  . Anxiety with depression 10/08/2016  . Insomnia 09/22/2016  . Bronchitis 05/26/2016  . Vitamin D deficiency 04/07/2016  . Easy bruising 03/20/2016  . 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 Anemia associated with chemotherapy 10/24/2014  . Malignant neoplasm of upper-outer quadrant of left breast in female, estrogen receptor positive ([place]) 07/31/2014  . Hyperlipidemia 09/06/2012  . Generalized anxiety disorder 09/06/2012  . Meniere's disease 09/06/2012  . Statin intolerance 09/06/2012  . Left rotator cuff tear 09/06/2012   Donato Heinz. Owens Shark PT  Norwood Levo 06/18/2017, 1:02 PM  [place], [place], [address] Phone: [phone]   Fax:  [phone]  Name: Jade  HANAKO Miller MRN: [id number] Date of Birth: 03-Dec-1960

## 2017-06-19 ENCOUNTER — Ambulatory Visit: Payer: PRIVATE HEALTH INSURANCE | Admitting: Physical Therapy

## 2017-06-24 NOTE — Assessment & Plan Note (Signed)
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 Adjuvant chemotherapy with dose dense Adriamycin and Cytoxan x 4 followed by Abraxane weekly 12 because of lymph node positive disease. Started 09/04/14 to 01/15/15 2. adjuvant radiation completed 03/20/15 3. Followed by antiestrogen therapy started 04/03/15 -------------------------------------------------------------------------------------------------------------------------- Anastrozole toxicities: Patient denies any hot flashes. Weight gain Arthralgias and myalgias  Bone density 07/26/2015: T score -1.9, osteopenia  PALLAS Trial : Patient has been randomized to anastrozole alone. ABC clinical trial: The trial randomizes between aspirin versus placebo. She went on trial 07/26/2015 Currently on 100 mg of dosage. Bruising has become markedly reduced.  Adverse effects: 1. Bruising/ Hematoma: Resolved after dose reduction. She is currently going to be on 100 mg of aspirin/placebo 2. bleeding from mucous membranes especially GI, vaginal, nose, gums, nailbeds: Resolved  Cognitive dysfunction: OnAricept 10 mg daily. Memory function has improvedsignificantly  Weight gain:On Weight Watchers, now doing Keto diet  Breast cancer surveillance: 1. Mammogram done 07/23/2016 showed asymmetry right breast which was evaluated by ultrasound which did not show any abnormality 2. Breast exam 08/06/17: No palpable lumps or nodules of concern. 3. PET/CT 11/26/2016:Small Left sided level 2 LN with hypermetabolism, post surg changes in breast and small right  groin lymph node likely infectious/inflammatory, gallstones Return to clinic in 6 months for follow-up  Right-sided abdominal pain : Laparoscopic cholecystectomy 01/05/2017 and lymph node biopsy: Reactive at [place]

## 2017-06-24 NOTE — Progress Notes (Signed)
Patient Care Team: Chipper Herb, MD as PCP - General (Family Medicine) Benson Norway, RN as Registered Nurse (Oncology) Nicholas Lose, MD as Consulting Physician (Hematology and Oncology) Coralie Keens, MD as Consulting Physician (General Surgery) Kyung Rudd, MD as Consulting Physician (Radiation Oncology) Sylvan Cheese, NP as Nurse Practitioner (Hematology and Oncology)  DIAGNOSIS:  Encounter Diagnosis  Name Primary?  . Malignant neoplasm of upper-outer quadrant of left breast in female, estrogen receptor positive ([place])     SUMMARY OF ONCOLOGIC HISTORY:   Malignant neoplasm of upper-outer quadrant of left breast in female, estrogen receptor positive ([place])   07/18/2014 Mammogram    Distortion left breast, breast density category C; U/S 1.3 x 0.8 x 0.8 cm left breast mass at 2:00 position 4 cm from nipple, no lymph nodes      07/20/2014 Initial Diagnosis    Left breast Biopsy: Invasive lobular cancer with LCIS, Grade 1, ER 86%, PR 78%, Her 2 Neg Ratio 1.77, Ki 67: 14%      08/01/2014 Breast MRI    Breast MRI showed non-mass enhancement 3.9 cm, no lymph nodes      08/01/2014 Clinical Stage    Stage IIA: T2 N0      08/08/2014 Surgery    Left breast lumpectomy: 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, with LCIS, medial and inferior margins positive, 3/4 lymph nodes positive      08/08/2014 Pathologic Stage    Stage IIB: T2 N1c M0      08/16/2014 Surgery    Left breast medial margin reexcision residual ILC 0.2 cm; inferior margin residual foci less than 0.2 cm, 1/5 lymph nodes positive, 1 lymph node with isolated tumor cells (Overall 4/10)      09/04/2014 - 01/15/2015 Chemotherapy    Adjuvant chemotherapy with dose dense Adriamycin and Cytoxan 4 followed by Abraxane weekly 8 ( stopped early for profound neutropenia and thrombocytopenia)      01/29/2015 - 03/20/2015 Radiation Therapy    Adjuvant XRT [facility]): 50.4 Gy over 28 fractions; seroma boost: 10 Gy  over 5 fractions. Total dose: 60.4 Gy      04/11/2015 -  Anti-estrogen oral therapy    Anastrozole 1 mg daily (PALLAS clinical trial 05/24/2015 patient was randomized to hormone therapy alone)      05/24/2015 Survivorship    Survivorship visit completed and copy of care plan given to patient      11/26/2016 PET scan    Small Left sided level 2 LN with hypermetabolism, post surg changes in breast and small right groin lymph node likely infectious/inflammatory, gallstones       CHIEF COMPLIANT: Follow-up on multiple clinical trials, surveillance of breast cancer, evaluation of toxicities on anastrozole therapy  INTERVAL HISTORY: [name] is a 57 year old with above-mentioned history of left breast cancer treated with lumpectomy followed by adjuvant chemotherapy and radiation.  She is currently on oral antiestrogen therapy with anastrozole as started January 2017.  She has been on it for the past 2 years.  She is tolerating it fairly well.  She does have arthralgias and myalgias.  She also has noticed weight gain.  She is on multiple clinical trials including PALLAS and ABC trials.  She complains of intermittent bruising but that has not been bothering her significantly.  She had abdominal pain for which she was treated with the cholecystectomy.  The pain appears to have resolved.  For memory issues she is currently on Aricept and it appears to be working  well for her.  She continues to struggle with weight issues.  REVIEW OF SYSTEMS:   Constitutional: Denies fevers, chills or abnormal weight loss Eyes: Denies blurriness of vision Ears, nose, mouth, throat, and face: Denies mucositis or sore throat Respiratory: Denies cough, dyspnea or wheezes Cardiovascular: Denies palpitation, chest discomfort Gastrointestinal:  Denies nausea, heartburn or change in bowel habits Skin: Denies abnormal skin rashes Lymphatics: Denies new lymphadenopathy or easy bruising Neurological:Denies numbness,  tingling or new weaknesses Behavioral/Psych: Mood is stable, no new changes  Extremities: No lower extremity edema All other systems were reviewed with the patient and are negative.  I have reviewed the past medical history, past surgical history, social history and family history with the patient and they are unchanged from previous note.  ALLERGIES:  is allergic to statins; crestor [rosuvastatin]; lipitor [atorvastatin]; and temazepam.  MEDICATIONS:  Current Outpatient Medications  Medication Sig Dispense Refill  . acetaminophen (TYLENOL) 325 MG tablet Take 975 mg by mouth daily as needed for moderate pain. Usually takes twice daily in morning and bedtime and additionally during the day if needed for pain    . anastrozole (ARIMIDEX) 1 MG tablet TAKE 1 TABLET EVERY DAY 90 tablet 2  . Biotin 1000 MCG tablet Take 1,000 mcg by mouth daily.    . clonazePAM (KLONOPIN) 0.5 MG tablet TAKE 1 TABLET BY MOUTH THREE TIMES A DAY AS NEEDED 90 tablet 1  . donepezil (ARICEPT) 10 MG tablet TAKE 1 TABLET (10 MG TOTAL) BY MOUTH AT BEDTIME. 90 tablet 3  . Investigational aspirin/placebo 100 MG tablet [facility] [id number] Take 1 tablet by mouth daily. Take with food or a full glass of water. Do not crush enteric coated tablets. 180 tablet 0  . Investigational aspirin/placebo 100 MG tablet Alliance [id number] Take 1 tablet by mouth daily. Take with food or a full glass of water. Do not crush enteric coated tablets. 90 tablet 0  . LORazepam (ATIVAN) 1 MG tablet TAKE 1 TABLET BY MOUTH AT BEDTIME 30 tablet 0  . PARoxetine (PAXIL-CR) 25 MG 24 hr tablet Take 1 tablet (25 mg total) by mouth daily. 90 tablet 3  . rosuvastatin (CRESTOR) 10 MG tablet Take 1 tablet (10 mg total) by mouth daily. 90 tablet 3  . Turmeric 500 MG TABS Take 1 tablet by mouth daily.    . Vitamin D, Ergocalciferol, (DRISDOL) 50000 units CAPS capsule TAKE 1 CAPSULE (50,000 UNITS TOTAL) BY MOUTH EVERY 7 (SEVEN) DAYS. 12 capsule 3   No current  facility-administered medications for this visit.     PHYSICAL EXAMINATION: ECOG PERFORMANCE STATUS: 1 - Symptomatic but completely ambulatory  Vitals:   07/06/17 0852  BP: (!) 143/83  Pulse: 63  Resp: 18  Temp: 98.9 F (37.2 C)  SpO2: 100%   Filed Weights   07/06/17 0852  Weight: 162 lb 1.6 oz (73.5 kg)    GENERAL:alert, no distress and comfortable SKIN: skin color, texture, turgor are normal, no rashes or significant lesions EYES: normal, Conjunctiva are pink and non-injected, sclera clear OROPHARYNX:no exudate, no erythema and lips, buccal mucosa, and tongue normal  NECK: supple, thyroid normal size, non-tender, without nodularity LYMPH:  no palpable lymphadenopathy in the cervical, axillary or inguinal LUNGS: clear to auscultation and percussion with normal breathing effort HEART: regular rate & rhythm and no murmurs and no lower extremity edema ABDOMEN:abdomen soft, non-tender and normal bowel sounds MUSCULOSKELETAL:no cyanosis of digits and no clubbing  NEURO: alert & oriented x 3 with fluent speech, no focal motor/sensory  deficits EXTREMITIES: No lower extremity edema BREAST: No palpable masses or nodules in either right or left breasts. No palpable axillary supraclavicular or infraclavicular adenopathy no breast tenderness or nipple discharge. (exam performed in the presence of a chaperone)  LABORATORY DATA:  I have reviewed the data as listed CMP Latest Ref Rng & Units 07/06/2017 04/16/2017 03/09/2017  Glucose 70 - 140 mg/dL 101 107(H) 102  BUN 7 - 26 mg/dL 7 10 11.4  Creatinine 0.60 - 1.10 mg/dL 0.65 0.48(L) 0.6  Sodium 136 - 145 mmol/L 142 142 142  Potassium 3.5 - 5.1 mmol/L 3.4(L) 4.2 3.7  Chloride 98 - 109 mmol/L 107 104 -  CO2 22 - 29 mmol/L 26 21 28   Calcium 8.4 - 10.4 mg/dL 9.1 9.0 8.7  Total Protein 6.4 - 8.3 g/dL 7.0 6.9 6.8  Total Bilirubin 0.2 - 1.2 mg/dL 0.8 0.3 0.46  Alkaline Phos 40 - 150 U/L 71 76 81  AST 5 - 34 U/L 22 20 19   ALT 0 - 55 U/L 21 24  24     Lab Results  Component Value Date   WBC 4.7 07/06/2017   HGB 13.7 07/06/2017   HCT 40.9 07/06/2017   MCV 97.1 07/06/2017   PLT 142 (L) 07/06/2017   NEUTROABS 2.0 07/06/2017    ASSESSMENT & PLAN:  Malignant neoplasm of upper-outer quadrant of left breast in female, estrogen receptor positive ([place]) Left breast lumpectomy 08/08/2014: 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 Adjuvant chemotherapy with dose dense Adriamycin and Cytoxan x 4 followed by Abraxane weekly 12 because of lymph node positive disease. Started 09/04/14 to 01/15/15 2. adjuvant radiation completed 03/20/15 3. Followed by antiestrogen therapy started 04/03/15 -------------------------------------------------------------------------------------------------------------------------- Anastrozole toxicities: Patient denies any hot flashes. Weight gain Arthralgias and myalgias  Bone density 07/26/2015: T score -1.9, osteopenia  PALLAS Trial : Patient has been randomized to anastrozole alone. ABC clinical trial: The trial randomizes between aspirin versus placebo. She went on trial 07/26/2015 Currently on 100 mg of dosage. Bruising has become markedly reduced.  Adverse effects: 1. Bruising/ Hematoma: Resolved after dose reduction. She is currently going to be on 100 mg of aspirin/placebo 2. bleeding from mucous membranes especially GI, vaginal, nose, gums, nailbeds: Resolved  Cognitive dysfunction: OnAricept 10 mg daily. Memory function has improvedsignificantly  Weight gain:On Weight Watchers, now doing Keto diet  Breast cancer  surveillance: 1. Mammogram done 07/23/2016 showed asymmetry right breast which was evaluated by ultrasound which did not show any abnormality 2. Breast exam 08/06/17: No palpable lumps or nodules of concern. 3. PET/CT 11/26/2016:Small Left sided level 2 LN with hypermetabolism, post surg changes in breast and small right groin lymph node likely infectious/inflammatory, gallstones Return to clinic in 6 months for follow-up  Right-sided abdominal pain : Laparoscopic cholecystectomy 01/05/2017 and lymph node biopsy: Reactive at [place]    No orders of the defined types were placed in this encounter.  The patient has a good understanding of the overall plan. she agrees with it. she will call with any problems that may develop before the next visit here.   Harriette Ohara, MD 07/06/17

## 2017-07-06 ENCOUNTER — Inpatient Hospital Stay: Payer: PRIVATE HEALTH INSURANCE | Admitting: *Deleted

## 2017-07-06 ENCOUNTER — Inpatient Hospital Stay (HOSPITAL_BASED_OUTPATIENT_CLINIC_OR_DEPARTMENT_OTHER): Payer: PRIVATE HEALTH INSURANCE | Admitting: Hematology and Oncology

## 2017-07-06 ENCOUNTER — Inpatient Hospital Stay: Payer: PRIVATE HEALTH INSURANCE | Attending: Hematology and Oncology

## 2017-07-06 ENCOUNTER — Telehealth: Payer: Self-pay | Admitting: Hematology and Oncology

## 2017-07-06 ENCOUNTER — Encounter: Payer: Self-pay | Admitting: *Deleted

## 2017-07-06 DIAGNOSIS — Z170 Estrogen receptor positive status [ER+]: Secondary | ICD-10-CM

## 2017-07-06 DIAGNOSIS — Z006 Encounter for examination for normal comparison and control in clinical research program: Secondary | ICD-10-CM

## 2017-07-06 DIAGNOSIS — Z79811 Long term (current) use of aromatase inhibitors: Secondary | ICD-10-CM | POA: Insufficient documentation

## 2017-07-06 DIAGNOSIS — C50412 Malignant neoplasm of upper-outer quadrant of left female breast: Secondary | ICD-10-CM

## 2017-07-06 DIAGNOSIS — M8580 Other specified disorders of bone density and structure, unspecified site: Secondary | ICD-10-CM | POA: Insufficient documentation

## 2017-07-06 DIAGNOSIS — Z9221 Personal history of antineoplastic chemotherapy: Secondary | ICD-10-CM | POA: Insufficient documentation

## 2017-07-06 DIAGNOSIS — Z923 Personal history of irradiation: Secondary | ICD-10-CM | POA: Insufficient documentation

## 2017-07-06 LAB — CBC WITH DIFFERENTIAL (CANCER CENTER ONLY)
Basophils Absolute: 0 10*3/uL (ref 0.0–0.1)
Basophils Relative: 0 %
Eosinophils Absolute: 0.1 10*3/uL (ref 0.0–0.5)
Eosinophils Relative: 2 %
HEMATOCRIT: 40.9 % (ref 34.8–46.6)
HEMOGLOBIN: 13.7 g/dL (ref 11.6–15.9)
LYMPHS ABS: 2.1 10*3/uL (ref 0.9–3.3)
LYMPHS PCT: 45 %
MCH: 32.5 pg (ref 25.1–34.0)
MCHC: 33.5 g/dL (ref 31.5–36.0)
MCV: 97.1 fL (ref 79.5–101.0)
MONOS PCT: 11 %
Monocytes Absolute: 0.5 10*3/uL (ref 0.1–0.9)
NEUTROS ABS: 2 10*3/uL (ref 1.5–6.5)
NEUTROS PCT: 42 %
Platelet Count: 142 10*3/uL — ABNORMAL LOW (ref 145–400)
RBC: 4.21 MIL/uL (ref 3.70–5.45)
RDW: 13.3 % (ref 11.2–14.5)
WBC: 4.7 10*3/uL (ref 3.9–10.3)

## 2017-07-06 LAB — CMP (CANCER CENTER ONLY)
ALT: 21 U/L (ref 0–55)
ANION GAP: 9 (ref 3–11)
AST: 22 U/L (ref 5–34)
Albumin: 4.3 g/dL (ref 3.5–5.0)
Alkaline Phosphatase: 71 U/L (ref 40–150)
BUN: 7 mg/dL (ref 7–26)
CHLORIDE: 107 mmol/L (ref 98–109)
CO2: 26 mmol/L (ref 22–29)
Calcium: 9.1 mg/dL (ref 8.4–10.4)
Creatinine: 0.65 mg/dL (ref 0.60–1.10)
GFR, Estimated: >60 mL/min (ref >60)
Glucose, Bld: 101 mg/dL (ref 70–140)
POTASSIUM: 3.4 mmol/L — AB (ref 3.5–5.1)
SODIUM: 142 mmol/L (ref 136–145)
Total Bilirubin: 0.8 mg/dL (ref 0.2–1.2)
Total Protein: 7 g/dL (ref 6.4–8.3)

## 2017-07-06 LAB — RESEARCH LABS

## 2017-07-06 LAB — HEMOGLOBIN A1C
HEMOGLOBIN A1C: 5 % (ref 4.8–5.6)
MEAN PLASMA GLUCOSE: 96.8 mg/dL

## 2017-07-06 MED ORDER — INV-ASPIRIN/PLACEBO 100 MG TABS ALLIANCE A011502: 1.0000 | ORAL_TABLET | Freq: Every day | ORAL | 0 refills | Status: DC

## 2017-07-06 NOTE — Telephone Encounter (Signed)
Not able to reach patient by phone or leave message re October 2019 appointments, voicemail full. Schedule mailed.

## 2017-07-06 NOTE — Progress Notes (Signed)
07/06/2017  Patient in to clinic unaccompanied today for AFT-05 PALLAS End of Treatment visit and Alliance [id number] ABC Month 24 visit.  AFT-05 PALLAS - End of Treatment visit Patient returned her completed anti-hormone therapy drug diary for Cycles 24 through 26 showing no missed doses. Patient is aware that she will continue to take anastrozole but is no longer required to complete diaries for the study. See Adverse Event Log table below regarding AEs. Next study contact will be coordinated with required study visit for Alliance [id number] ABC dual enrollment study. Patient is aware that PALLAS study requirements are lessened now during Phase I Follow-up. Thanked patient for her time and continued support of study and she was encouraged to call with any questions or concerns she may have.   Alliance [id number] ABC - Month 24 visit See Research Consent Form Encounter regarding study reconsent. Patient reports ongoing mild bruising (grade 1) associated with occasional trauma. She denies any further vaginal bleeding, but continues to have occasional mild hemorrhoidal hemorrhage associated with constipation; neither event requiring intervention. She denies any symptoms of nosebleeds, hematuria, gastritis or heartburn. There has been no documentation of other gastrointestinal bleeding or intracranial hemorrhage. She has experienced no cardiovascular events, including myocardial infarction, CVA or need for cardiovascular procedures. She denies taking any aspirin or medications containing NSAIDs, and states that she takes only Tylenol for mild pain. Patient did not remember to return study medication pill bottles, but believes she has one empty bottle at home, as well as the almost full bottle which was recently opened. This bottle, expected for the Month 24 visit, was dispensed early at her last clinic visit for the PALLAS study, due to scheduling delay of the ABC Month 24 study visit to coordinate with the [place]  visit. Reminded patient to return all empty or partial pill bottles at each study visit, for drug reconciliation. One new bottle of study medication (aspirin 100mg /placebo x 90 tablets per bottle) was dispensed by pharmacist Demetrius Charity and was given to the patient for continued dosing through next study visit in October 2019. Patient returned completed medication logs for October 2018 through April 2019 showing no missed doses. The patient was given blank copies of medication logs for April through November 2019 for recording daily doses of IP. Note that next study visit due 01/11/2018, however, patient will only have sufficient IP supply to last until 12/16/2017.  [name] BSN, RN, [place] 07/06/2017 10:06 AM  Adverse Event Log  AFT-05 PALLAS Cycle 24 to End of Treatment Visit 03/09/2017 - 07/06/2017 Alliance [id number] Month 24 12/22/2016 - 07/06/2017 Baseline AEs (grade): dizziness, int. (1); anxiety (2); hiccups, int. (1); lower extremity pain, int. (2) Event Grade Onset Date Resolved Date Attribution to anastrozole Attribution to aspirin/placebo Treatment Comments  Memory impairment 1 06/06/2016 ongoing No Unrelated Aricept    Musculoskeletal aches 1 01/09/2016 ongoing Yes Not reportable None Intermittent in nature  Bruising 1 04/07/2016 ongoing No Probable None    Hypertension 2 12/22/2016 ongoing No Unrelated None   Surgical site pain, int. 1 01/09/2016 ongoing No Not reportable Tylenol    Weight gain 1 12/20/2015 03/09/2017 Yes Not reportable None Worsened in grade  Osteopenia 1 07/26/2015 ongoing Yes Not reportable None    Constipation, int. 1 12/2015 ongoing Yes Not reportable None    Hemorrhoids 1 12/2015 ongoing No Not reportable None    Hematoma, abdominal wall 1 03/20/2016 01/08/2017 No Unlikely None Resolved after surgery, estimated date  Left arm swelling, intermittent 2  12/22/2016 ongoing No Unrelated PT referral   Insomnia 2 09/07/2016 ongoing Yes Unrelated Ativan   Platelet count  decreased 1 09/22/2016 03/09/2017 NCS Not reportable None   Hemorrhoidal bleeding, int. 1 10/08/2016 ongoing No Unlikely None    Vaginal bleeding, int. 1 10/08/2016 4/UN/2019 No Not reportable None    Right lower lateral chest wall pain 2 11/17/2016 02/03/2017 No Unrelated None   Shortness of breath 1 11/17/2016 07/06/2017 No Not reportable None Persistent after surgery  Cholelithiasis  3 12/30/2016 12/30/2016 No Unrelated surgery   Weight gain 2 03/09/2017 ongoing No Possible None Improved to grade 1 on 04/16/2017  Laparoscopic wound redness 2 02/03/2017 03/09/2017 No Unrelated Keflex   Left upper arm pain 1 02/18/2017 ongoing Yes Not reportable PT   Left lower arm pain 2 02/18/2017 ongoing No Unlikely PT   Left breast swelling 2 02/18/2017 ongoing No Unlikely PT   Left axillary swelling 2 02/18/2017 ongoing No Unlikely PT   Left axillary pain 2 04/14/2017 ongoing No Unlikely Self care   Cellulitis, right index finger 2 04/16/2017 05/10/2017 No Unlikely antibiotics   Nausea 1 2/UN/2019 05/05/2017 No Not reportable None   Lt. arm tingling, int. 1 05/05/2017 07/06/2017 No Not reportable None   Muscle tightness, lt. shoulder region 2 05/05/2017 ongoing Yes Unlikely Home exercises   Migraine 2 05/29/2017 3/UN/2019 No Unlikely None   Hypokalemia 1 07/06/2017 ongoing No Not reportable None   Thrombocytopenia 1 07/06/2017 ongoing No Not reportable None   Other grade 1 events not clinically significant (AFT-05 PALLAS study), and not reportable for [id number]: - elevated glucose - decreased creatinine - increased MCV - T-wave flattening [name] BSN, RN, [facility] 07/24/2017 3:46 PM

## 2017-07-15 ENCOUNTER — Encounter: Payer: Self-pay | Admitting: Physical Therapy

## 2017-07-15 ENCOUNTER — Ambulatory Visit: Payer: PRIVATE HEALTH INSURANCE | Attending: Hematology and Oncology | Admitting: Physical Therapy

## 2017-07-15 ENCOUNTER — Other Ambulatory Visit: Payer: Self-pay

## 2017-07-15 DIAGNOSIS — L599 Disorder of the skin and subcutaneous tissue related to radiation, unspecified: Secondary | ICD-10-CM | POA: Insufficient documentation

## 2017-07-15 DIAGNOSIS — M6281 Muscle weakness (generalized): Secondary | ICD-10-CM | POA: Diagnosis present

## 2017-07-15 DIAGNOSIS — I890 Lymphedema, not elsewhere classified: Secondary | ICD-10-CM | POA: Insufficient documentation

## 2017-07-15 DIAGNOSIS — R293 Abnormal posture: Secondary | ICD-10-CM | POA: Insufficient documentation

## 2017-07-15 NOTE — Therapy (Addendum)
[place], [place], [address] Phone: [phone]   Fax:  [phone]  Physical Therapy Treatment  Patient Details  Name: [name] MRN: [id number] Date of Birth: April 18, 1960 Referring Provider: Dr. Lindi Adie    Encounter Date: 07/15/2017  PT End of Session - 07/15/17 1419    Visit Number  15    Number of Visits  33    Date for PT Re-Evaluation  09/17/17 allowing protacted time for decreased frequency    PT Start Time  0930    PT Stop Time  1030    PT Time Calculation (min)  60 min    Activity Tolerance  Patient limited by pain;Patient tolerated treatment well    Behavior During Therapy  [facility] for tasks assessed/performed       Past Medical History:  Diagnosis Date  . Abnormal Pap smear of cervix 2002   Dr. Eilleen Kempf   . Allergy 2012   seasonal  . Anxiety   . Benign hemangioma    Dr,  Marybelle Killings   . Breast cancer ([place]) 07/20/14   Left Breast  . Cervical stenosis (uterine cervix) 2002  . Depression   . Dizziness 1991   secondary to Meniere's disease  . Hyperlipidemia 2007  . Insomnia 1997  . Meniere's disease 1991  . Nervousness(799.21) 1987    Past Surgical History:  Procedure Laterality Date  . AXILLARY LYMPH NODE DISSECTION Left 08/16/2014   Procedure: AXILLARY LYMPH NODE DISSECTION;  Surgeon: Coralie Keens, MD;  Location: [place];  Service: General;  Laterality: Left;  . bilateral  tubal ligation  05/08/2004  . BREAST LUMPECTOMY WITH RADIOACTIVE SEED AND SENTINEL LYMPH NODE BIOPSY Left 08/08/2014   Procedure: BREAST LUMPECTOMY WITH RADIOACTIVE SEED AND SENTINEL LYMPH NODE BIOPSY;  Surgeon: Coralie Keens, MD;  Location: [place];  Service: General;  Laterality: Left;  . CHOLECYSTECTOMY  12/30/2016  . MASTOPEXY Right 12/14/2015   Procedure: RIGHT BREAST MASTOPEXY;  Surgeon: Irene Limbo, MD;  Location: [place];  Service: Plastics;   Laterality: Right;  . PORT-A-CATH REMOVAL N/A 03/08/2015   Procedure: REMOVAL PORT-A-CATH;  Surgeon: Coralie Keens, MD;  [facility];  Service: General;  Laterality: N/A;  . PORTACATH PLACEMENT Right 08/16/2014   Procedure: INSERTION PORT-A-CATH;  Surgeon: Coralie Keens, MD;  Location: [place];  Service: General;  Laterality: Right;  . RE-EXCISION OF BREAST LUMPECTOMY Left 08/16/2014   Procedure: RE-EXCISION OF LEFT BREAST CANCER AND PORT PLACEMENT/POSSIBLE AXILLARY DISECTION;  Surgeon: Coralie Keens, MD;  Location: [place];  Service: General;  Laterality: Left;  . right breast surg     [place] Right 07/28/2012   Procedure: RIGHT SHOULDER DECOMPRESSION AND EXCISION OF CALCIFIC DEPOSIT;  Surgeon: Tobi Bastos, MD;  [facility];  Service: Orthopedics;  Laterality: Right;  . TUBAL LIGATION    . uterine ablation  02/2006    Due to heavy periods Dr. Jon Billings   . wisdom teeth extractions  yrs ago    There were no vitals filed for this visit.  Subjective Assessment - 07/15/17 0939    Subjective  Pt states that she has been having more pain in her left arm and also swelling in right arm ( possibly becasue she has not been able to use her left arm much because of pain) and also swelling up into her neck.  She has been wearing compresison sleeves  on both arms and finds that it helps  She has also been doing daily exercise and husband is doing manual lymph drainage and it has not been helping     Pertinent History  Left breast cancer with lumpectomy on 08/08/2014 with re-excision 08/16/2014 with total of 6 nodes removed. She has chemotherapy 09/04/2014- 01/15/2015 and radiation 01/29/2015-03/20/2015 She has a laparoscopic cholecystectomy 12/27/2016. right rotator cuff repair 07/28/2012    Patient Stated Goals  to get her arm "under control"     Currently in Pain?  Yes    Pain Score  6  when trying to use it     Pain  Location  Axilla    Pain Orientation  Right    Pain Descriptors / Indicators  Aching it hurts! feels inflamed     Pain Type  Chronic pain    Pain Radiating Towards  to lower arm especially in forearm     Pain Onset  More than a month ago    Pain Frequency  Constant            LYMPHEDEMA/ONCOLOGY QUESTIONNAIRE - 07/15/17 0950      Right Upper Extremity Lymphedema   10 cm Proximal to Olecranon Process  29.5 cm    Olecranon Process  26 cm    15 cm Proximal to Ulnar Styloid Process  27 cm    10 cm Proximal to Ulnar Styloid Process  24.5 cm    Just Proximal to Ulnar Styloid Process  17.5 cm    Across Hand at [employer]  19.5 cm    At [place] of 2nd Digit  6.5 cm      Left Upper Extremity Lymphedema   10 cm Proximal to Olecranon Process  29 cm    Olecranon Process  26.5 cm    15 cm Proximal to Ulnar Styloid Process  25 cm    10 cm Proximal to Ulnar Styloid Process  23.5 cm    Just Proximal to Ulnar Styloid Process  17.7 cm    Across Hand at [employer]  18.8 cm    At [place] of 2nd Digit  6.5 cm                                PT Short Term Goals - 07/15/17 1429      PT SHORT TERM GOAL #1   Title  Pt will verbalize knowledge of lymphedema risk reduction practices     Status  Achieved      PT SHORT TERM GOAL #2   Title  Pt will know how to get compression garments to manage trunk and arm swelling during airplane travel     Status  Achieved      PT SHORT TERM GOAL #3   Title  Pt will be independent in home exercise program for postural strengthening     Status  Achieved      PT SHORT TERM GOAL #4   Title  Pt will report that the swelling in her left axilla is decreased by 50% and is able to be maintained for several days     Time  --    Period  --    Status  --        PT Long Term Goals - 07/15/17 1431      PT LONG TERM GOAL #1   Title  Pt will report 75% improvment in her symptoms  of left arm pain and swelling     Baseline  1/15 pt  says she has improvement especially with the tape, but it comes back , 04/21/2016 pt reports 50% improvement , 06/18/2017, pt has not achieved 75% improvment yet     Status  On-going      PT LONG TERM GOAL #2   Title  Pt will be knowldegable of the use of manual lymph drainage and compression to manage symptoms     Baseline  needs review     Time  8    Period  Weeks    Status  On-going      PT LONG TERM GOAL #3   Title  Pt will be indepenent in a progressive resistance strengthening program to decrease the risk of lymphedema exacerbations.     Time  12    Period  Weeks    Status  On-going      PT LONG TERM GOAL #4   Title  Pt will report 50% decrease in pain from trigger points in left upper quadrant     Time  12    Period  Weeks    Status  On-going      PT LONG TERM GOAL #5   Title  Family will be able to assist pt with exercise and manual techniques for myofascial work for    Time  12    Period  Weeks    Status  On-going            Plan - 07/15/17 1422    Clinical Impression Statement  Pt has not been send in PT for almost 4 weeks and has an exacerbation of symptoms.  She reports increased pain and swelling in left upper quadrant especially with pain to touch in axilla lateral chest, arm and forearms.  She has exquisite tenderness in axilla and along anterior chest at pec major area that may be due to late effects from radiation.  Axillary scar is contracted and tender.  She has tenderness in left forerm with pain with grip strength testing. She has palpable relief from axillary fullness with manual lymph draiange and responds well to kinesiotape.  Pt symptoms seem to be able to be controlled with physical therapy at least weekly. She needs continue therapy to decrease pain and swelling to return to controlled state and work with exercise, compression and manual techniques to maintain improved status and gradually decreased frequency of visits  once this exacerbation is  controlled.     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  Continue myofascial work to left axillary and chest area and try focues compression to axilla if pt able to tolerate. Review manual lymph draiange and myofascial release techniques as needed to family so they can be more effective at home     PT Home Exercise Plan  Meeks decompression, supine scapular series, shoulder isometrics ,lower trunk rotation , Strength ABC program     Consulted and Agree with Plan of Care  Patient       Patient will benefit from skilled therapeutic intervention in order to improve the following deficits and impairments:  Decreased endurance, Decreased knowledge of precautions, Decreased scar mobility, Increased edema, Impaired UE functional use,  Increased fascial restricitons, Decreased strength, Decreased knowledge of use of DME, Decreased activity tolerance, Pain, Impaired perceived functional ability, Postural dysfunction, Abnormal gait  Visit Diagnosis: Lymphedema, not elsewhere classified  Disorder of the skin and subcutaneous tissue related to radiation, unspecified  Muscle weakness (generalized)  Abnormal posture     Problem List Patient Active Problem List   Diagnosis Date Noted  . Aortic atherosclerosis ([place]) 04/16/2017  . Nodule of upper lobe of right lung 04/16/2017  . Anxiety with depression 10/08/2016  . Insomnia 09/22/2016  . Bronchitis 05/26/2016  . Vitamin D deficiency 04/07/2016  . Easy bruising 03/20/2016  . 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 Anemia associated with chemotherapy 10/24/2014  . Malignant neoplasm of upper-outer quadrant of left breast in female, estrogen receptor positive ([place]) 07/31/2014  . Hyperlipidemia 09/06/2012  . Generalized anxiety disorder 09/06/2012  .  Meniere's disease 09/06/2012  . Statin intolerance 09/06/2012  . Left rotator cuff tear 09/06/2012    Norwood Levo 07/16/2017, 12:25 PM  [place], [place], [address] Phone: [phone]   Fax:  [phone]  Name: [name] MRN: [id number] Date of Birth: 1960-10-27

## 2017-07-17 ENCOUNTER — Other Ambulatory Visit: Payer: Self-pay | Admitting: Oncology

## 2017-07-17 DIAGNOSIS — C50412 Malignant neoplasm of upper-outer quadrant of left female breast: Secondary | ICD-10-CM

## 2017-07-21 ENCOUNTER — Telehealth: Payer: Self-pay | Admitting: Physical Therapy

## 2017-07-21 NOTE — Telephone Encounter (Signed)
Contacted pt case manager Deidre Zanders [phone]) and received authorization for 4 more visits over 4 weeks.  Contacted pt to inform her and also to discuss options for intermittent compression pump for long term lymphedema management as pt may have limited PT visits left.  Pt agrees to send demographics to [employer] for Flexitouch and also to [employer] to see what insurance benefits would be. Donato Heinz. Owens Shark, PT

## 2017-08-05 ENCOUNTER — Ambulatory Visit: Payer: PRIVATE HEALTH INSURANCE | Admitting: Physical Therapy

## 2017-08-05 ENCOUNTER — Telehealth: Payer: Self-pay | Admitting: Physical Therapy

## 2017-08-05 NOTE — Telephone Encounter (Signed)
Called pt as she had called previously with concern about systemic edema.  Let her know the the compression pumps are not for this type of edema and Dr. Lindi Adie has been notified and wil follow up with her. She did say that both compression pump companies have been in touch with her , but trials have not been scheduled.  They are still working with her insurance company to find out if there is coverage. Notes have been faxed to both. [place] and will come for next appt on June 12 Maudry Diego, PT May 29. 2019 @ 9:33 AM

## 2017-08-13 ENCOUNTER — Other Ambulatory Visit: Payer: Self-pay | Admitting: Hematology and Oncology

## 2017-08-13 MED ORDER — ALENDRONATE SODIUM 70 MG PO TABS: 70.0000 mg | ORAL_TABLET | ORAL | 3 refills | Status: DC

## 2017-08-19 ENCOUNTER — Ambulatory Visit: Payer: PRIVATE HEALTH INSURANCE | Attending: Hematology and Oncology | Admitting: Physical Therapy

## 2017-08-19 DIAGNOSIS — R293 Abnormal posture: Secondary | ICD-10-CM | POA: Insufficient documentation

## 2017-08-19 DIAGNOSIS — I890 Lymphedema, not elsewhere classified: Secondary | ICD-10-CM | POA: Insufficient documentation

## 2017-08-19 DIAGNOSIS — L599 Disorder of the skin and subcutaneous tissue related to radiation, unspecified: Secondary | ICD-10-CM | POA: Insufficient documentation

## 2017-08-19 DIAGNOSIS — M6281 Muscle weakness (generalized): Secondary | ICD-10-CM | POA: Insufficient documentation

## 2017-08-26 ENCOUNTER — Ambulatory Visit: Payer: PRIVATE HEALTH INSURANCE | Admitting: Physical Therapy

## 2017-08-26 DIAGNOSIS — R293 Abnormal posture: Secondary | ICD-10-CM | POA: Diagnosis present

## 2017-08-26 DIAGNOSIS — L599 Disorder of the skin and subcutaneous tissue related to radiation, unspecified: Secondary | ICD-10-CM

## 2017-08-26 DIAGNOSIS — I890 Lymphedema, not elsewhere classified: Secondary | ICD-10-CM | POA: Diagnosis present

## 2017-08-26 DIAGNOSIS — M6281 Muscle weakness (generalized): Secondary | ICD-10-CM

## 2017-08-26 NOTE — Therapy (Addendum)
[place], [place], [address] Phone: [phone]   Fax:  [phone]  Physical Therapy Treatment  Patient Details  Name: [name] MRN: [id number] Date of Birth: 1960-04-15 Referring Provider: Dr. Lindi Adie    Encounter Date: 08/26/2017  PT End of Session - 08/26/17 1126    Visit Number  16    Number of Visits  33    Date for PT Re-Evaluation  09/17/17    PT Start Time  0840    PT Stop Time  0935    PT Time Calculation (min)  55 min    Activity Tolerance  Patient tolerated treatment well    Behavior During Therapy  [facility] for tasks assessed/performed       Past Medical History:  Diagnosis Date  . Abnormal Pap smear of cervix 2002   Dr. Eilleen Kempf   . Allergy 2012   seasonal  . Anxiety   . Benign hemangioma    Dr,  Marybelle Killings   . Breast cancer ([place]) 07/20/14   Left Breast  . Cervical stenosis (uterine cervix) 2002  . Depression   . Dizziness 1991   secondary to Meniere's disease  . Hyperlipidemia 2007  . Insomnia 1997  . Meniere's disease 1991  . Nervousness(799.21) 1987    Past Surgical History:  Procedure Laterality Date  . AXILLARY LYMPH NODE DISSECTION Left 08/16/2014   Procedure: AXILLARY LYMPH NODE DISSECTION;  Surgeon: Coralie Keens, MD;  Location: [place];  Service: General;  Laterality: Left;  . bilateral  tubal ligation  05/08/2004  . BREAST LUMPECTOMY WITH RADIOACTIVE SEED AND SENTINEL LYMPH NODE BIOPSY Left 08/08/2014   Procedure: BREAST LUMPECTOMY WITH RADIOACTIVE SEED AND SENTINEL LYMPH NODE BIOPSY;  Surgeon: Coralie Keens, MD;  Location: [place];  Service: General;  Laterality: Left;  . CHOLECYSTECTOMY  12/30/2016  . MASTOPEXY Right 12/14/2015   Procedure: RIGHT BREAST MASTOPEXY;  Surgeon: Irene Limbo, MD;  Location: [place];  Service: Plastics;  Laterality: Right;  . PORT-A-CATH REMOVAL N/A 03/08/2015    Procedure: REMOVAL PORT-A-CATH;  Surgeon: Coralie Keens, MD;  [facility];  Service: General;  Laterality: N/A;  . PORTACATH PLACEMENT Right 08/16/2014   Procedure: INSERTION PORT-A-CATH;  Surgeon: Coralie Keens, MD;  Location: [place];  Service: General;  Laterality: Right;  . RE-EXCISION OF BREAST LUMPECTOMY Left 08/16/2014   Procedure: RE-EXCISION OF LEFT BREAST CANCER AND PORT PLACEMENT/POSSIBLE AXILLARY DISECTION;  Surgeon: Coralie Keens, MD;  [place];  Service: General;  Laterality: Left;  . right breast surg     [place] Right 07/28/2012   Procedure: RIGHT SHOULDER DECOMPRESSION AND EXCISION OF CALCIFIC DEPOSIT;  Surgeon: Tobi Bastos, MD;  [facility];  Service: Orthopedics;  Laterality: Right;  . TUBAL LIGATION    . uterine ablation  02/2006    Due to heavy periods Dr. Jon Billings   . wisdom teeth extractions  yrs ago    There were no vitals filed for this visit.  Subjective Assessment - 08/26/17 0844    Subjective  Pt says is still having the fullness in her arm. She is still wearing her compression sleeves when she is doing yardwork or heavy work with her arms.  She is now taking fosamax as she has had osteoporosis diagnosed on DEXA scan. . She is supposed to have her trials for compression pump . Today she brings in  her aunt to learn how to do the manual lymph drainage and kinesiotaping     Patient is accompained by:  Family member Aunt Inez Catalina     Pertinent History  Left breast cancer with lumpectomy on 08/08/2014 with re-excision 08/16/2014 with total of 6 nodes removed. She has chemotherapy 09/04/2014- 01/15/2015 and radiation 01/29/2015-03/20/2015 She has a laparoscopic cholecystectomy 12/27/2016. right rotator cuff repair 07/28/2012    Patient Stated Goals  to get her arm "under control"     Currently in Pain?  Yes    Pain Score  6     Pain Location  Axilla    Pain Orientation  Left     Pain Descriptors / Indicators  Aching    Pain Type  Chronic pain    Pain Radiating Towards  progresses down in to her forearm.    Pain Onset  More than a month ago    Pain Frequency  Constant    Aggravating Factors   can't really say     Pain Relieving Factors  manual lymph drainage                        [facility] Adult PT Treatment/Exercise - 08/26/17 0001      Manual Therapy   Manual Therapy  Myofascial release;Manual Lymphatic Drainage (MLD);Taping    Myofascial Release  with crossed hands at lateral chest around scars wtih release felt   Also educated her Kathryne Sharper with hand over hand technique to elicit release in left axilla     Manual Lymphatic Drainage (MLD)  Briefly, In supine, short neck, shoulder collectors, superficial and deep abdominals, right axillary nodes, anterior interaxillary anastamosis, left inguinal nodes, left axillo-inguinal anastamosis, left anterior chest, axilla, upper arm to hand and return along pathways.  then to sidelying for posterior interaxillary anastamosis, and lateral chest and back  educated her aunt in techniques with hand over hand technique at posterior interaxillary anastamosis and left arm     Kinesiotex  Edema      Kinesiotix   Edema  basketweave I band over puffy areas at axilla and contracted scar, 4 pronged piece from abdomen to left axilla               PT Education - 08/26/17 1127    Education provided  Yes    Education Details  myofascial release and manual lymph drainage     Person(s) Educated  Theatre stage manager) Patient's Aunt Betty     Methods  Explanation;Demonstration;Tactile cues;Verbal cues    Comprehension  Verbalized understanding;Returned demonstration       PT Short Term Goals - 08/26/17 1151      PT SHORT TERM GOAL #1   Title  Pt will verbalize knowledge of lymphedema risk reduction practices     Status  Achieved      PT SHORT TERM GOAL #2   Title  Pt will know how to get compression garments to  manage trunk and arm swelling during airplane travel     Status  Achieved      PT SHORT TERM GOAL #3   Title  Pt will be independent in home exercise program for postural strengthening     Status  Achieved      PT SHORT TERM GOAL #4   Title  Pt will report that the swelling in her left axilla is decreased by 50% and is able to be maintained for several days     Time  4  Period  Weeks    Status  On-going        PT Long Term Goals - 08/26/17 1152      PT LONG TERM GOAL #1   Title  Pt will report 75% improvment in her symptoms of left arm pain and swelling     Baseline  1/15 pt says she has improvement especially with the tape, but it comes back , 04/21/2016 pt reports 50% improvement , 06/18/2017, pt has not achieved 75% improvment yet     Status  On-going      PT LONG TERM GOAL #2   Title  Pt will be knowldegable of the use of manual lymph drainage and compression to manage symptoms     Status  On-going      PT LONG TERM GOAL #3   Title  Pt will be indepenent in a progressive resistance strengthening program to decrease the risk of lymphedema exacerbations.     Status  Achieved      PT LONG TERM GOAL #4   Title  Pt will report 50% decrease in pain from trigger points in left upper quadrant     Status  On-going      PT LONG TERM GOAL #5   Title  Family will be able to assist pt with exercise and manual techniques for myofascial work for    Status  On-going            Plan - 08/26/17 1130    Clinical Impression Statement  Pt has not been able to attend PT since 5/8 2019 . Sessions were decreased in frequency in an effort to wean from clinic visits to see if she could effectively manage her symptoms at home. Pt has had new diagnosis of osteoporosis and is on a new medication.  She has continued with exercise and occasionally wearing compression sleeves,  but her husband attmepts at manual lymph drainage have not managed her symptoms, She had to miss appointments as she got  urgently called to meeting with her company owner and had to cancel her appointments. She comes in today with her Kathryne Sharper to learn manual lymph drainage and myofascial release around the scars in her her left lateral chest and breast.  When she first came into treatment today she has obvious visible fullness at left axilla that was tender to touch. She also had fullness in her left arm and forearm.  She has visilbe and palpable reduction of fluid with treatment today and her [place] was able to demonstrate techniques. Pt is scheduled to have trial of compression pumps this week and is hopeful they will help control her symptoms.  Plan to have pt come one more time this week for PT to help manage fullness and then with work with family providing treatment and use of compression pump for several weeks.  Pt wil come back in August for reassessment.  Tried to call Tillie Rung, case manager for insurance companty to review this plan, but left a message for her to call me back as she was unavailable today.     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     PT Treatment/Interventions  Manual lymph drainage;ADLs/Self Care Home Management;Compression bandaging;Scar mobilization;Passive range of motion;Taping;Manual techniques;Therapeutic exercise;Therapeutic activities;Orthotic Fit/Training;DME Instruction;Dry needling    PT Next Visit Plan  myofascial release to left axilla, manual lymph drainage, soft tissue work to trigger points at left  lateral chest and back, stretching and kinesiotape     Consulted and Agree with Plan of Care  Patient       Patient will benefit from skilled therapeutic intervention in order to improve the following deficits and impairments:  Decreased endurance, Decreased knowledge of precautions, Decreased scar mobility, Increased edema, Impaired UE functional use, Increased fascial restricitons, Decreased strength,  Decreased knowledge of use of DME, Decreased activity tolerance, Pain, Impaired perceived functional ability, Postural dysfunction, Abnormal gait  Visit Diagnosis: Lymphedema, not elsewhere classified  Disorder of the skin and subcutaneous tissue related to radiation, unspecified  Muscle weakness (generalized)  Abnormal posture     Problem List Patient Active Problem List   Diagnosis Date Noted  . Aortic atherosclerosis ([place]) 04/16/2017  . Nodule of upper lobe of right lung 04/16/2017  . Anxiety with depression 10/08/2016  . Insomnia 09/22/2016  . Bronchitis 05/26/2016  . Vitamin D deficiency 04/07/2016  . Easy bruising 03/20/2016  . 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 Anemia associated with chemotherapy 10/24/2014  . Malignant neoplasm of upper-outer quadrant of left breast in female, estrogen receptor positive ([place]) 07/31/2014  . Hyperlipidemia 09/06/2012  . Generalized anxiety disorder 09/06/2012  . Meniere's disease 09/06/2012  . Statin intolerance 09/06/2012  . Left rotator cuff tear 09/06/2012   Donato Heinz. Owens Shark PT  Norwood Levo 08/26/2017, 11:53 AM  [place], [place], [address] Phone: [phone]   Fax:  [phone]  Name: [name] MRN: [id number] Date of Birth: [birth year]  PHYSICAL THERAPY DISCHARGE SUMMARY  Visits from Start of Care: 16  Current functional level related to goals / functional outcomes: Spoke with pt by phone today. She said she is doing very will.  She has the Flexitouch and uses it every day.  It is keeping her symptoms of swelling and pain under control.    Remaining deficits: Occasional intermittent pain that is manageable    Education / Equipment: Self manual lymph drainage, home exercise  Plan: Patient agrees to discharge.  Patient goals were met. Patient is being discharged due to being pleased with the current functional level.  ?????     Maudry Diego, PT 12/29/17 1:40 PM

## 2017-08-28 ENCOUNTER — Ambulatory Visit: Payer: PRIVATE HEALTH INSURANCE | Admitting: Physical Therapy

## 2017-09-02 ENCOUNTER — Ambulatory Visit: Payer: PRIVATE HEALTH INSURANCE | Admitting: Rehabilitation

## 2017-10-13 ENCOUNTER — Ambulatory Visit: Payer: PRIVATE HEALTH INSURANCE | Attending: Hematology and Oncology | Admitting: Physical Therapy

## 2017-10-15 ENCOUNTER — Encounter: Payer: Self-pay | Admitting: Family Medicine

## 2017-10-15 ENCOUNTER — Ambulatory Visit (INDEPENDENT_AMBULATORY_CARE_PROVIDER_SITE_OTHER): Payer: PRIVATE HEALTH INSURANCE | Admitting: Family Medicine

## 2017-10-15 VITALS — BP 128/84 | HR 62 | Temp 98.2°F | Ht 65.0 in | Wt 165.0 lb

## 2017-10-15 DIAGNOSIS — F418 Other specified anxiety disorders: Secondary | ICD-10-CM

## 2017-10-15 DIAGNOSIS — C50412 Malignant neoplasm of upper-outer quadrant of left female breast: Secondary | ICD-10-CM

## 2017-10-15 DIAGNOSIS — Z170 Estrogen receptor positive status [ER+]: Secondary | ICD-10-CM

## 2017-10-15 DIAGNOSIS — E559 Vitamin D deficiency, unspecified: Secondary | ICD-10-CM | POA: Diagnosis not present

## 2017-10-15 DIAGNOSIS — H8109 Meniere's disease, unspecified ear: Secondary | ICD-10-CM | POA: Diagnosis not present

## 2017-10-15 DIAGNOSIS — Z0000 Encounter for general adult medical examination without abnormal findings: Secondary | ICD-10-CM

## 2017-10-15 DIAGNOSIS — E7800 Pure hypercholesterolemia, unspecified: Secondary | ICD-10-CM | POA: Diagnosis not present

## 2017-10-15 DIAGNOSIS — I700 Atherosclerosis of aorta: Secondary | ICD-10-CM

## 2017-10-15 MED ORDER — DONEPEZIL HCL 10 MG PO TABS: 10.0000 mg | ORAL_TABLET | Freq: Every day | ORAL | 3 refills | Status: DC

## 2017-10-15 MED ORDER — CLONAZEPAM 0.5 MG PO TABS: 0.5000 mg | ORAL_TABLET | Freq: Three times a day (TID) | ORAL | 5 refills | Status: DC | PRN

## 2017-10-15 MED ORDER — PAROXETINE HCL ER 25 MG PO TB24: 25.0000 mg | ORAL_TABLET | Freq: Every day | ORAL | 3 refills | Status: DC

## 2017-10-15 MED ORDER — ALENDRONATE SODIUM 70 MG PO TABS: 70.0000 mg | ORAL_TABLET | ORAL | 3 refills | Status: DC

## 2017-10-15 MED ORDER — VITAMIN D (ERGOCALCIFEROL) 1.25 MG (50000 UNIT) PO CAPS: ORAL_CAPSULE | ORAL | 3 refills | Status: DC

## 2017-10-15 MED ORDER — ROSUVASTATIN CALCIUM 10 MG PO TABS: 10.0000 mg | ORAL_TABLET | Freq: Every day | ORAL | 3 refills | Status: DC

## 2017-10-15 NOTE — Progress Notes (Signed)
Subjective:    Patient ID: [name], female    DOB: [birth year], 57 y.o.   MRN: [id number]  HPI Pt here for follow up and management of chronic medical problems that include hyperlipidemia. She is taking medication regularly.  The patient is doing well overall and she is coming up on 3 years after her diagnosis of breast cancer.  She is followed regularly by her oncologist, Dr. Quintella Reichert.  She is also followed periodically by the cardiologist, Dr. Percival Spanish.  She has a strong family history of heart disease and also has hyperlipidemia.  She will get lab work soon and will be given an FOBT to return.  She is requesting refills on all of her medicines.  She has no specific complaints today.  She is being seen by [place] about every 6 months.  She gets her pelvic exams and mammograms with Dr. Willis Modena and this will be coming up in November.  The patient is doing great overall has a very positive attitude about her breast cancer.  She is exercising regularly and is just concerned about the problem with weight loss around her abdomen.  She will continue with her aggressive physical activity and will continue with drinking lots of water and will try to start doing some exercises involving her abdomen.  I praised her for her positive attitude and being such a good role model for her doctors and for other patients.  She denies any chest pain pressure tightness or shortness of breath.  She denies any trouble with swallowing heartburn indigestion nausea vomiting diarrhea or black tarry bowel movements or change in bowel habits.  Her last colonoscopy was in 2014 and will be due again in 2024.  She does occasionally see some bright red blood in the stool which she says comes from hemorrhoids and she does take a stool softener for this.  She is passing her water well but does have some frequency but no burning or pain with voiding.   Patient Active Problem List   Diagnosis Date Noted  . Aortic atherosclerosis ([place])  04/16/2017  . Nodule of upper lobe of right lung 04/16/2017  . Anxiety with depression 10/08/2016  . Insomnia 09/22/2016  . Bronchitis 05/26/2016  . Vitamin D deficiency 04/07/2016  . Easy bruising 03/20/2016  . 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 Anemia associated with chemotherapy 10/24/2014  . Malignant neoplasm of upper-outer quadrant of left breast in female, estrogen receptor positive ([place]) 07/31/2014  . Hyperlipidemia 09/06/2012  . Generalized anxiety disorder 09/06/2012  . Meniere's disease 09/06/2012  . Statin intolerance 09/06/2012  . Left rotator cuff tear 09/06/2012   Outpatient Encounter Medications as of 10/15/2017  Medication Sig  . acetaminophen (TYLENOL) 325 MG tablet Take 975 mg by mouth daily as needed for moderate pain. Usually takes twice daily in morning and bedtime and additionally during the day if needed for pain  . alendronate (FOSAMAX) 70 MG tablet Take 1 tablet (70 mg total) by mouth once a week. Take with a full glass of water on an empty stomach.  Marland Kitchen anastrozole (ARIMIDEX) 1 MG tablet TAKE 1 TABLET BY MOUTH EVERY DAY  . Biotin 1000 MCG tablet Take 1,000 mcg by mouth daily.  . clonazePAM (KLONOPIN) 0.5 MG tablet TAKE 1 TABLET BY MOUTH THREE TIMES A DAY AS NEEDED  . donepezil (ARICEPT) 10 MG tablet TAKE 1 TABLET (10 MG TOTAL) BY MOUTH AT BEDTIME.  . Investigational aspirin/placebo 100 MG tablet Alliance [id number] Take 1 tablet by mouth daily. Take  with food or a full glass of water. Do not crush enteric coated tablets.  . Investigational aspirin/placebo 100 MG tablet Alliance [id number] Take 1 tablet by mouth daily. Take with food or a full glass of water. Do not crush enteric coated tablets.  Marland Kitchen LORazepam (ATIVAN) 1 MG tablet TAKE 1 TABLET BY MOUTH AT BEDTIME  . PARoxetine (PAXIL-CR) 25 MG 24 hr tablet Take 1 tablet (25 mg total) by mouth daily.  . rosuvastatin (CRESTOR) 10 MG tablet Take 1 tablet (10 mg total) by mouth daily.  . Turmeric 500 MG TABS Take 1 tablet by mouth  daily.  . Vitamin D, Ergocalciferol, (DRISDOL) 50000 units CAPS capsule TAKE 1 CAPSULE (50,000 UNITS TOTAL) BY MOUTH EVERY 7 (SEVEN) DAYS.   No facility-administered encounter medications on file as of 10/15/2017.       Review of Systems  Constitutional: Negative.   HENT: Negative.   Eyes: Negative.   Respiratory: Negative.   Cardiovascular: Negative.   Gastrointestinal: Negative.   Endocrine: Negative.   Genitourinary: Negative.   Musculoskeletal: Negative.   Skin: Negative.   Allergic/Immunologic: Negative.   Neurological: Negative.   Hematological: Negative.   Psychiatric/Behavioral: Negative.        Objective:   Physical Exam  Constitutional: She is oriented to person, place, and time. She appears well-developed and well-nourished. No distress.  The patient is pleasant and alert and positive  HENT:  Head: Normocephalic and atraumatic.  Right Ear: External ear normal.  Left Ear: External ear normal.  Nose: Nose normal.  Mouth/Throat: Oropharynx is clear and moist. No oropharyngeal exudate.  Eyes: Pupils are equal, round, and reactive to light. Conjunctivae and EOM are normal. Right eye exhibits no discharge. Left eye exhibits no discharge. No scleral icterus.  Neck: Normal range of motion. Neck supple. No thyromegaly present.  No bruits thyromegaly or anterior cervical adenopathy  Cardiovascular: Normal rate, regular rhythm, normal heart sounds and intact distal pulses.  No murmur heard. Lungs are clear anteriorly and posteriorly.  The heart has a regular rate and rhythm at 72/min with good pedal pulses.  Pulmonary/Chest: Effort normal and breath sounds normal. No respiratory distress. She has no wheezes. She has no rales. She exhibits no tenderness.  Lungs are clear anteriorly and posteriorly  Abdominal: Soft. Bowel sounds are normal. She exhibits no mass. There is no tenderness. There is no guarding.  No liver or spleen enlargement no epigastric tenderness no bruits no  inguinal adenopathy and no masses.  Musculoskeletal: Normal range of motion. She exhibits no edema.  Lymphadenopathy:    She has no cervical adenopathy.  Neurological: She is alert and oriented to person, place, and time. She has normal reflexes. No cranial nerve deficit.  Skin: Skin is warm and dry. No rash noted.  Psychiatric: She has a normal mood and affect. Her behavior is normal. Judgment and thought content normal.  The patient has normal mood affect and behavior.  Nursing note and vitals reviewed.  BP 128/84 (BP Location: Right Arm)   Pulse 62   Temp 98.2 F (36.8 C) (Oral)   Ht _0  (1.651 m)   Wt 165 lb (74.8 kg)   BMI 27.46 kg/m         Assessment & Plan:  1. Pure hypercholesterolemia -Continue current treatment and aggressive therapeutic lifestyle changes pending results of lab work - BMP8+EGFR - CBC with Differential/Platelet - Lipid panel - Hepatic function panel  2. Vitamin D deficiency -Continue with vitamin D replacement pending results of  lab work - CBC with Differential/Platelet - VITAMIN D 25 Hydroxy (Vit-D Deficiency, Fractures)  3. Meniere's disease, unspecified laterality -No complaints today with dizziness and this condition seems to be under good control currently. - BMP8+EGFR - CBC with Differential/Platelet  4. Malignant neoplasm of upper-outer quadrant of left breast in female, estrogen receptor positive ([place]) -Continue to follow-up with oncology - CBC with Differential/Platelet  5. Anxiety with depression -Patient seems to be positive with her current treatment regimen - CBC with Differential/Platelet  6. Aortic atherosclerosis ([place]) -With positive family history and this finding from x-ray she should continue to take her medicines for cholesterol and follow as aggressive therapeutic lifestyle changes as possible - CBC with Differential/Platelet - Lipid panel  7. Healthcare maintenance -She had a CT scan of her chest and March 2018  and the oncologist is aware of the small lymph node that was present and will follow up appropriately. - Thyroid Panel With TSH  Meds ordered this encounter  Medications  . alendronate (FOSAMAX) 70 MG tablet    Sig: Take 1 tablet (70 mg total) by mouth once a week. Take with a full glass of water on an empty stomach.    Dispense:  12 tablet    Refill:  3  . donepezil (ARICEPT) 10 MG tablet    Sig: Take 1 tablet (10 mg total) by mouth at bedtime.    Dispense:  90 tablet    Refill:  3  . PARoxetine (PAXIL-CR) 25 MG 24 hr tablet    Sig: Take 1 tablet (25 mg total) by mouth daily.    Dispense:  90 tablet    Refill:  3  . rosuvastatin (CRESTOR) 10 MG tablet    Sig: Take 1 tablet (10 mg total) by mouth daily.    Dispense:  90 tablet    Refill:  3  . Vitamin D, Ergocalciferol, (DRISDOL) 50000 units CAPS capsule    Sig: TAKE 1 CAPSULE (50,000 UNITS TOTAL) BY MOUTH EVERY 7 (SEVEN) DAYS.    Dispense:  12 capsule    Refill:  3  . clonazePAM (KLONOPIN) 0.5 MG tablet    Sig: Take 1 tablet (0.5 mg total) by mouth 3 (three) times daily as needed.    Dispense:  90 tablet    Refill:  5    This request is for a new prescription for a controlled substance as required by Federal/State law..   Patient Instructions                       Medicare Annual Wellness Visit  [facility] and the medical providers at [facility] strive to bring you the best medical care.  In doing so we not only want to address your current medical conditions and concerns but also to detect new conditions early and prevent illness, disease and health-related problems.    Medicare offers a yearly Wellness Visit which allows our clinical staff to assess your need for preventative services including immunizations, lifestyle education, counseling to decrease risk of preventable diseases and screening for fall risk and other medical concerns.    This visit is provided free of charge (no copay) for all  Medicare recipients. The clinical pharmacists at [facility] have begun to conduct these Wellness Visits which will also include a thorough review of all your medications.    As you primary medical provider recommend that you make an appointment for your Annual Wellness Visit if you have  not done so already this year.  You may set up this appointment before you leave today or you may call back ([phone]) and schedule an appointment.  Please make sure when you call that you mention that you are scheduling your Annual Wellness Visit with the clinical pharmacist so that the appointment may be made for the proper length of time.     Continue current medications. Continue good therapeutic lifestyle changes which include good diet and exercise. Fall precautions discussed with patient. If an FOBT was given today- please return it to our front desk. If you are over 74 years old - you may need Prevnar 60 or the adult Pneumonia vaccine.  **Flu shots are available--- please call and schedule a FLU-CLINIC appointment**  After your visit with [place] today you will receive a survey in the mail or online from [employer] regarding your care with [place]. Please take a moment to fill this out. Your feedback is very important to [place] as you can help [place] better understand your patient needs as well as improve your experience and satisfaction. WE CARE ABOUT YOU!!!   Continue to follow aggressive therapeutic lifestyle changes with diet and exercise as you are currently doing Consider getting a training specialist to work with you to help you with your concerns about your weight. Keep the positive added to that you have and I am sure some of this weight will continue to come off. Eat healthy and stay active physically Continue to follow-up with oncology as you are currently doing  Arrie Senate MD

## 2017-10-15 NOTE — Patient Instructions (Addendum)
Medicare Annual Wellness Visit  [facility] and the medical providers at [facility] strive to bring you the best medical care.  In doing so we not only want to address your current medical conditions and concerns but also to detect new conditions early and prevent illness, disease and health-related problems.    Medicare offers a yearly Wellness Visit which allows our clinical staff to assess your need for preventative services including immunizations, lifestyle education, counseling to decrease risk of preventable diseases and screening for fall risk and other medical concerns.    This visit is provided free of charge (no copay) for all Medicare recipients. The clinical pharmacists at [facility] have begun to conduct these Wellness Visits which will also include a thorough review of all your medications.    As you primary medical provider recommend that you make an appointment for your Annual Wellness Visit if you have not done so already this year.  You may set up this appointment before you leave today or you may call back ([phone]) and schedule an appointment.  Please make sure when you call that you mention that you are scheduling your Annual Wellness Visit with the clinical pharmacist so that the appointment may be made for the proper length of time.     Continue current medications. Continue good therapeutic lifestyle changes which include good diet and exercise. Fall precautions discussed with patient. If an FOBT was given today- please return it to our front desk. If you are over 44 years old - you may need Prevnar 57 or the adult Pneumonia vaccine.  **Flu shots are available--- please call and schedule a FLU-CLINIC appointment**  After your visit with [place] today you will receive a survey in the mail or online from [employer] regarding your care with [place]. Please take a moment to fill this out. Your feedback is very  important to [place] as you can help [place] better understand your patient needs as well as improve your experience and satisfaction. WE CARE ABOUT YOU!!!   Continue to follow aggressive therapeutic lifestyle changes with diet and exercise as you are currently doing Consider getting a training specialist to work with you to help you with your concerns about your weight. Keep the positive added to that you have and I am sure some of this weight will continue to come off. Eat healthy and stay active physically Continue to follow-up with oncology as you are currently doing

## 2017-10-16 ENCOUNTER — Other Ambulatory Visit: Payer: Self-pay | Admitting: Family Medicine

## 2017-10-16 ENCOUNTER — Other Ambulatory Visit: Payer: Self-pay

## 2017-10-16 DIAGNOSIS — Z1239 Encounter for other screening for malignant neoplasm of breast: Secondary | ICD-10-CM

## 2017-10-16 LAB — CBC WITH DIFFERENTIAL/PLATELET
BASOS ABS: 0 10*3/uL (ref 0.0–0.2)
Basos: 0 %
EOS (ABSOLUTE): 0.1 10*3/uL (ref 0.0–0.4)
Eos: 1 %
Hematocrit: 40.2 % (ref 34.0–46.6)
Hemoglobin: 13.7 g/dL (ref 11.1–15.9)
IMMATURE GRANS (ABS): 0 10*3/uL (ref 0.0–0.1)
Immature Granulocytes: 0 %
LYMPHS: 40 %
Lymphocytes Absolute: 1.8 10*3/uL (ref 0.7–3.1)
MCH: 32.8 pg (ref 26.6–33.0)
MCHC: 34.1 g/dL (ref 31.5–35.7)
MCV: 96 fL (ref 79–97)
Monocytes Absolute: 0.4 10*3/uL (ref 0.1–0.9)
Monocytes: 9 %
NEUTROS ABS: 2.2 10*3/uL (ref 1.4–7.0)
Neutrophils: 50 %
PLATELETS: 188 10*3/uL (ref 150–450)
RBC: 4.18 x10E6/uL (ref 3.77–5.28)
RDW: 14.2 % (ref 12.3–15.4)
WBC: 4.6 10*3/uL (ref 3.4–10.8)

## 2017-10-16 LAB — HEPATIC FUNCTION PANEL
ALBUMIN: 4.6 g/dL (ref 3.5–5.5)
ALT: 21 IU/L (ref 0–32)
AST: 20 IU/L (ref 0–40)
Alkaline Phosphatase: 72 IU/L (ref 39–117)
Bilirubin Total: 0.4 mg/dL (ref 0.0–1.2)
Bilirubin, Direct: 0.16 mg/dL (ref 0.00–0.40)
TOTAL PROTEIN: 6.6 g/dL (ref 6.0–8.5)

## 2017-10-16 LAB — BMP8+EGFR
BUN/Creatinine Ratio: 15 (ref 9–23)
BUN: 8 mg/dL (ref 6–24)
CALCIUM: 8.8 mg/dL (ref 8.7–10.2)
CHLORIDE: 105 mmol/L (ref 96–106)
CO2: 24 mmol/L (ref 20–29)
Creatinine, Ser: 0.54 mg/dL — ABNORMAL LOW (ref 0.57–1.00)
GFR calc non Af Amer: 107 mL/min/{1.73_m2} (ref >59)
GFR, EST AFRICAN AMERICAN: 123 mL/min/{1.73_m2} (ref >59)
GLUCOSE: 106 mg/dL — AB (ref 65–99)
POTASSIUM: 3.8 mmol/L (ref 3.5–5.2)
Sodium: 145 mmol/L — ABNORMAL HIGH (ref 134–144)

## 2017-10-16 LAB — THYROID PANEL WITH TSH
Free Thyroxine Index: 1.7 (ref 1.2–4.9)
T3 UPTAKE RATIO: 29 % (ref 24–39)
T4 TOTAL: 6 ug/dL (ref 4.5–12.0)
TSH: 1.37 u[IU]/mL (ref 0.450–4.500)

## 2017-10-16 LAB — LIPID PANEL
CHOL/HDL RATIO: 2.9 ratio (ref 0.0–4.4)
CHOLESTEROL TOTAL: 138 mg/dL (ref 100–199)
HDL: 48 mg/dL (ref >39)
LDL Calculated: 63 mg/dL (ref 0–99)
TRIGLYCERIDES: 137 mg/dL (ref 0–149)
VLDL Cholesterol Cal: 27 mg/dL (ref 5–40)

## 2017-10-16 LAB — VITAMIN D 25 HYDROXY (VIT D DEFICIENCY, FRACTURES): Vit D, 25-Hydroxy: 58.2 ng/mL (ref 30.0–100.0)

## 2017-10-19 ENCOUNTER — Other Ambulatory Visit: Payer: PRIVATE HEALTH INSURANCE

## 2017-10-19 ENCOUNTER — Other Ambulatory Visit: Payer: Self-pay | Admitting: Hematology and Oncology

## 2017-10-19 DIAGNOSIS — Z170 Estrogen receptor positive status [ER+]: Principal | ICD-10-CM

## 2017-10-19 DIAGNOSIS — Z1211 Encounter for screening for malignant neoplasm of colon: Secondary | ICD-10-CM

## 2017-10-19 DIAGNOSIS — C50412 Malignant neoplasm of upper-outer quadrant of left female breast: Secondary | ICD-10-CM

## 2017-10-20 LAB — FECAL OCCULT BLOOD, IMMUNOCHEMICAL: Fecal Occult Bld: NEGATIVE

## 2017-10-28 ENCOUNTER — Ambulatory Visit (HOSPITAL_COMMUNITY)
Admission: RE | Admit: 2017-10-28 | Discharge: 2017-10-28 | Disposition: A | Discharge status: Home / Self Care | Payer: PRIVATE HEALTH INSURANCE | Source: Ambulatory Visit | Attending: Hematology and Oncology | Admitting: Hematology and Oncology

## 2017-10-28 ENCOUNTER — Encounter (HOSPITAL_COMMUNITY): Payer: Self-pay

## 2017-10-28 DIAGNOSIS — Z923 Personal history of irradiation: Secondary | ICD-10-CM | POA: Insufficient documentation

## 2017-10-28 DIAGNOSIS — Z9889 Other specified postprocedural states: Secondary | ICD-10-CM | POA: Insufficient documentation

## 2017-10-28 DIAGNOSIS — Z170 Estrogen receptor positive status [ER+]: Secondary | ICD-10-CM | POA: Insufficient documentation

## 2017-10-28 DIAGNOSIS — C50412 Malignant neoplasm of upper-outer quadrant of left female breast: Secondary | ICD-10-CM | POA: Diagnosis not present

## 2017-10-28 MED ORDER — IOHEXOL 300 MG/ML  SOLN
100.0000 mL | Freq: Once | INTRAMUSCULAR | Status: AC | PRN
  Administered 2017-10-28: 100 mL via INTRAVENOUS

## 2017-11-11 ENCOUNTER — Ambulatory Visit (INDEPENDENT_AMBULATORY_CARE_PROVIDER_SITE_OTHER): Payer: PRIVATE HEALTH INSURANCE | Admitting: Family Medicine

## 2017-11-11 ENCOUNTER — Encounter: Payer: Self-pay | Admitting: Family Medicine

## 2017-11-11 VITALS — BP 135/82 | HR 69 | Temp 97.9°F | Ht 65.0 in | Wt 165.0 lb

## 2017-11-11 DIAGNOSIS — C50412 Malignant neoplasm of upper-outer quadrant of left female breast: Secondary | ICD-10-CM | POA: Diagnosis not present

## 2017-11-11 DIAGNOSIS — S60463D Insect bite (nonvenomous) of left middle finger, subsequent encounter: Secondary | ICD-10-CM | POA: Diagnosis not present

## 2017-11-11 DIAGNOSIS — T63441A Toxic effect of venom of bees, accidental (unintentional), initial encounter: Secondary | ICD-10-CM

## 2017-11-11 DIAGNOSIS — W57XXXD Bitten or stung by nonvenomous insect and other nonvenomous arthropods, subsequent encounter: Secondary | ICD-10-CM

## 2017-11-11 DIAGNOSIS — Z170 Estrogen receptor positive status [ER+]: Secondary | ICD-10-CM

## 2017-11-11 MED ORDER — CEPHALEXIN 500 MG PO CAPS: 500.0000 mg | ORAL_CAPSULE | Freq: Three times a day (TID) | ORAL | 0 refills | Status: DC

## 2017-11-11 MED ORDER — METHYLPREDNISOLONE ACETATE 80 MG/ML IJ SUSP
60.0000 mg | Freq: Once | INTRAMUSCULAR | Status: AC
  Administered 2017-11-11: 60 mg via INTRAMUSCULAR

## 2017-11-11 NOTE — Progress Notes (Signed)
Subjective:    Patient ID: [name], female    DOB: 1960-10-29, 57 y.o.   MRN: [id number]  HPI Patient here today for a insect bite on her left middle finger and a sting on her left wrist.  The left side is the side where she has had lymph nodes removed because of breast cancer.  She had some type of bite on the third finger of the left hand a week before that remains red and somewhat swollen.  The bee sting happened a couple of days ago she thinks it was a wasp.  There is swelling in the distal forearm proximal to the wrist on the volar surface.    Patient Active Problem List   Diagnosis Date Noted  . Aortic atherosclerosis ([place]) 04/16/2017  . Nodule of upper lobe of right lung 04/16/2017  . Anxiety with depression 10/08/2016  . Insomnia 09/22/2016  . Bronchitis 05/26/2016  . Vitamin D deficiency 04/07/2016  . Easy bruising 03/20/2016  . 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 Anemia associated with chemotherapy 10/24/2014  . Malignant neoplasm of upper-outer quadrant of left breast in female, estrogen receptor positive ([place]) 07/31/2014  . Hyperlipidemia 09/06/2012  . Generalized anxiety disorder 09/06/2012  . Meniere's disease 09/06/2012  . Statin intolerance 09/06/2012  . Left rotator cuff tear 09/06/2012   Outpatient Encounter Medications as of 11/11/2017  Medication Sig  . acetaminophen (TYLENOL) 325 MG tablet Take 975 mg by mouth daily as needed for moderate pain. Usually takes twice daily in morning and bedtime and additionally during the day if needed for pain  . alendronate (FOSAMAX) 70 MG tablet Take 1 tablet (70 mg total) by mouth once a week. Take with a full glass of water on an empty stomach.  Marland Kitchen anastrozole (ARIMIDEX) 1 MG tablet TAKE 1 TABLET BY MOUTH EVERY DAY  . Biotin 1000 MCG tablet Take 1,000 mcg by mouth daily.  . clonazePAM (KLONOPIN) 0.5 MG tablet Take 1 tablet (0.5 mg total) by mouth 3 (three) times daily as needed.  . donepezil (ARICEPT) 10 MG tablet Take 1  tablet (10 mg total) by mouth at bedtime.  . Investigational aspirin/placebo 100 MG tablet Alliance [id number] Take 1 tablet by mouth daily. Take with food or a full glass of water. Do not crush enteric coated tablets.  . Investigational aspirin/placebo 100 MG tablet Alliance [id number] Take 1 tablet by mouth daily. Take with food or a full glass of water. Do not crush enteric coated tablets.  Marland Kitchen LORazepam (ATIVAN) 1 MG tablet TAKE 1 TABLET BY MOUTH AT BEDTIME  . PARoxetine (PAXIL-CR) 25 MG 24 hr tablet Take 1 tablet (25 mg total) by mouth daily.  . rosuvastatin (CRESTOR) 10 MG tablet Take 1 tablet (10 mg total) by mouth daily.  . Turmeric 500 MG TABS Take 1 tablet by mouth daily.  . Vitamin D, Ergocalciferol, (DRISDOL) 50000 units CAPS capsule TAKE 1 CAPSULE (50,000 UNITS TOTAL) BY MOUTH EVERY 7 (SEVEN) DAYS.   No facility-administered encounter medications on file as of 11/11/2017.      Review of Systems  Constitutional: Negative.   HENT: Negative.   Eyes: Negative.   Respiratory: Negative.   Cardiovascular: Negative.   Gastrointestinal: Negative.   Endocrine: Negative.   Genitourinary: Negative.   Musculoskeletal: Negative.   Skin: Negative.        Left middle finger bite and sting to left wrist   Allergic/Immunologic: Negative.   Neurological: Negative.   Hematological: Negative.   Psychiatric/Behavioral: Negative.  Objective:   Physical Exam  Constitutional: She appears well-developed and well-nourished. No distress.  Musculoskeletal: Normal range of motion. She exhibits edema, tenderness and deformity.  There is redness and slight swelling of the third finger of the left hand from some type of bite when she was working in the garden.  There is swelling at the left volar wrist area from the wasp or bee sting.  There is slight fever there.  Neurological: She is alert.  Skin: Skin is warm and dry. There is erythema.  Psychiatric: She has a normal mood and affect. Her behavior is  normal. Judgment and thought content normal.  Nursing note and vitals reviewed.  BP 135/82 (BP Location: Right Arm)   Pulse 69   Temp 97.9 F (36.6 C) (Oral)   Ht 5\' 5"  (1.651 m)   Wt 165 lb (74.8 kg)   BMI 27.46 kg/m         Assessment & Plan:  1. Bee sting, accidental or unintentional, initial encounter -Take Claritin 1 daily - methylPREDNISolone acetate (DEPO-MEDROL) injection 60 mg -Ice and elevation as much as possible  2. Insect bite of left middle finger, subsequent encounter -Take Claritin 1 daily -Take Keflex 503 times a day for 10 days - methylPREDNISolone acetate (DEPO-MEDROL) injection 60 mg  3. Malignant neoplasm of upper-outer quadrant of left breast in female, estrogen receptor positive ([place]) -Continue to follow-up with oncology -If swelling in left wrist is not improved by the weekend patient will get back in touch with [place] early next week.  Meds ordered this encounter  Medications  . methylPREDNISolone acetate (DEPO-MEDROL) injection 60 mg  . cephALEXin (KEFLEX) 500 MG capsule    Sig: Take 1 capsule (500 mg total) by mouth 3 (three) times daily.    Dispense:  30 capsule    Refill:  0   Patient Instructions  Continue to use ice on the wrist of the left hand Take Claritin 1 daily as an antihistamine for the bee sting. Take Keflex 500 3 times a day for 10 days with food. Elevate hand and wrist when possible  Arrie Senate MD

## 2017-11-11 NOTE — Patient Instructions (Signed)
Continue to use ice on the wrist of the left hand Take Claritin 1 daily as an antihistamine for the bee sting. Take Keflex 500 3 times a day for 10 days with food. Elevate hand and wrist when possible

## 2017-11-23 ENCOUNTER — Telehealth: Payer: Self-pay | Admitting: Family Medicine

## 2017-11-23 DIAGNOSIS — M25542 Pain in joints of left hand: Secondary | ICD-10-CM

## 2017-11-23 DIAGNOSIS — M7989 Other specified soft tissue disorders: Secondary | ICD-10-CM

## 2017-11-23 DIAGNOSIS — M25541 Pain in joints of right hand: Secondary | ICD-10-CM

## 2017-11-23 DIAGNOSIS — M79641 Pain in right hand: Secondary | ICD-10-CM

## 2017-11-23 DIAGNOSIS — M79642 Pain in left hand: Principal | ICD-10-CM

## 2017-11-23 NOTE — Telephone Encounter (Signed)
Pt called and states that her joints in hands have been large for years and that now she starting to notice swelling in them as well. Wonders if we can send to a hand specialist.  Prefers [facility] if insurance pays, if not hand specialist at [facility]

## 2017-12-11 ENCOUNTER — Other Ambulatory Visit: Payer: Self-pay

## 2017-12-11 ENCOUNTER — Telehealth: Payer: Self-pay | Admitting: *Deleted

## 2017-12-11 DIAGNOSIS — C50412 Malignant neoplasm of upper-outer quadrant of left female breast: Secondary | ICD-10-CM

## 2017-12-11 DIAGNOSIS — Z170 Estrogen receptor positive status [ER+]: Principal | ICD-10-CM

## 2017-12-11 NOTE — Telephone Encounter (Signed)
Spoke with pt and reminded her of appts on 12/14/17 and also to  bring her medication diaries and pill bottles with her to her appt.

## 2017-12-14 ENCOUNTER — Inpatient Hospital Stay (HOSPITAL_BASED_OUTPATIENT_CLINIC_OR_DEPARTMENT_OTHER): Payer: PRIVATE HEALTH INSURANCE | Admitting: Hematology and Oncology

## 2017-12-14 ENCOUNTER — Telehealth: Payer: Self-pay | Admitting: Hematology and Oncology

## 2017-12-14 ENCOUNTER — Other Ambulatory Visit: Payer: PRIVATE HEALTH INSURANCE

## 2017-12-14 ENCOUNTER — Inpatient Hospital Stay: Payer: PRIVATE HEALTH INSURANCE | Admitting: *Deleted

## 2017-12-14 ENCOUNTER — Inpatient Hospital Stay: Payer: PRIVATE HEALTH INSURANCE | Attending: Hematology and Oncology

## 2017-12-14 ENCOUNTER — Ambulatory Visit: Payer: PRIVATE HEALTH INSURANCE | Admitting: Hematology and Oncology

## 2017-12-14 VITALS — BP 147/80 | HR 66 | Temp 98.9°F | Resp 18 | Ht 65.0 in | Wt 167.6 lb

## 2017-12-14 DIAGNOSIS — R413 Other amnesia: Secondary | ICD-10-CM | POA: Diagnosis not present

## 2017-12-14 DIAGNOSIS — Z006 Encounter for examination for normal comparison and control in clinical research program: Secondary | ICD-10-CM | POA: Insufficient documentation

## 2017-12-14 DIAGNOSIS — L089 Local infection of the skin and subcutaneous tissue, unspecified: Secondary | ICD-10-CM | POA: Insufficient documentation

## 2017-12-14 DIAGNOSIS — C50412 Malignant neoplasm of upper-outer quadrant of left female breast: Secondary | ICD-10-CM

## 2017-12-14 DIAGNOSIS — Z79811 Long term (current) use of aromatase inhibitors: Secondary | ICD-10-CM | POA: Insufficient documentation

## 2017-12-14 DIAGNOSIS — Z23 Encounter for immunization: Secondary | ICD-10-CM | POA: Insufficient documentation

## 2017-12-14 DIAGNOSIS — Z923 Personal history of irradiation: Secondary | ICD-10-CM

## 2017-12-14 DIAGNOSIS — Z170 Estrogen receptor positive status [ER+]: Secondary | ICD-10-CM | POA: Insufficient documentation

## 2017-12-14 DIAGNOSIS — M8580 Other specified disorders of bone density and structure, unspecified site: Secondary | ICD-10-CM | POA: Insufficient documentation

## 2017-12-14 DIAGNOSIS — Z9221 Personal history of antineoplastic chemotherapy: Secondary | ICD-10-CM | POA: Diagnosis not present

## 2017-12-14 LAB — CMP (CANCER CENTER ONLY)
ALBUMIN: 4.1 g/dL (ref 3.5–5.0)
ALK PHOS: 58 U/L (ref 38–126)
ALT: 22 U/L (ref 0–44)
AST: 18 U/L (ref 15–41)
Anion gap: 9 (ref 5–15)
BILIRUBIN TOTAL: 0.8 mg/dL (ref 0.3–1.2)
BUN: 11 mg/dL (ref 6–20)
CALCIUM: 8.8 mg/dL — AB (ref 8.9–10.3)
CO2: 27 mmol/L (ref 22–32)
Chloride: 105 mmol/L (ref 98–111)
Creatinine: 0.67 mg/dL (ref 0.44–1.00)
GFR, Est AFR Am: >60 mL/min (ref >60)
GFR, Estimated: >60 mL/min (ref >60)
GLUCOSE: 102 mg/dL — AB (ref 70–99)
POTASSIUM: 3.9 mmol/L (ref 3.5–5.1)
Sodium: 141 mmol/L (ref 135–145)
TOTAL PROTEIN: 6.9 g/dL (ref 6.5–8.1)

## 2017-12-14 LAB — CBC WITH DIFFERENTIAL (CANCER CENTER ONLY)
BASOS ABS: 0 10*3/uL (ref 0.0–0.1)
BASOS PCT: 1 %
Eosinophils Absolute: 0.1 10*3/uL (ref 0.0–0.5)
Eosinophils Relative: 1 %
HEMATOCRIT: 39.9 % (ref 34.8–46.6)
HEMOGLOBIN: 13.6 g/dL (ref 11.6–15.9)
Lymphocytes Relative: 35 %
Lymphs Abs: 1.9 10*3/uL (ref 0.9–3.3)
MCH: 32.8 pg (ref 25.1–34.0)
MCHC: 34.1 g/dL (ref 31.5–36.0)
MCV: 96.2 fL (ref 79.5–101.0)
Monocytes Absolute: 0.5 10*3/uL (ref 0.1–0.9)
Monocytes Relative: 9 %
NEUTROS ABS: 2.9 10*3/uL (ref 1.5–6.5)
NEUTROS PCT: 54 %
Platelet Count: 182 10*3/uL (ref 145–400)
RBC: 4.15 MIL/uL (ref 3.70–5.45)
RDW: 13.7 % (ref 11.2–14.5)
WBC Count: 5.3 10*3/uL (ref 3.9–10.3)

## 2017-12-14 MED ORDER — INFLUENZA VAC SPLIT QUAD 0.5 ML IM SUSY
0.5000 mL | PREFILLED_SYRINGE | Freq: Once | INTRAMUSCULAR | Status: AC
  Administered 2017-12-14: 0.5 mL via INTRAMUSCULAR

## 2017-12-14 MED ORDER — INFLUENZA VAC SPLIT QUAD 0.5 ML IM SUSY
PREFILLED_SYRINGE | INTRAMUSCULAR | Status: AC
  Filled 2017-12-14: qty 0.5

## 2017-12-14 MED ORDER — CEPHALEXIN 500 MG PO CAPS: 500.0000 mg | ORAL_CAPSULE | Freq: Three times a day (TID) | ORAL | 0 refills | Status: DC

## 2017-12-14 MED ORDER — ANASTROZOLE 1 MG PO TABS: 1.0000 mg | ORAL_TABLET | Freq: Every day | ORAL | 3 refills | Status: DC

## 2017-12-14 NOTE — Research (Signed)
12/14/2017 Patient in to clinic this morning unaccompanied for study visits.  AFT-05 PALLAS - Month 30 follow-up visit Patient due for AFT-05 PALLAS study 30 month follow-up Phase 1 telephone call visit. Patient in to clinic today for Month 30 visit for the Alliance [id number] study. Patient continues to take anastrozole; she has not taken any other anti-cancer medications. Patient has had no SAEs since the EOT visit. See MD note for disease monitoring.  Alliance [id number] ABC - Month 30 visit Patient brought with her one previously dispensed medication bottle containing #11 aspirin 100mg /placebo tablets and one empty bottle which was returned to pharmacy for drug accountability by pharmacist Raul Del. Empty bottle had been dispensed early on 03/09/2017 with 15 tablets taken during the M18 dispensing period; remaining doses were taken during the M24 dispensing period.  Due to no replacement medication available in pharmacy today, patient will continue dosing from remaining 11 tablets which were redispensed to her today. Patient returned completed medication logs for April 2019 through October 2019 showing no missed doses. The patient was given blank copies of medication logs for October 2019 through April 2020 for recording daily doses of IP. Patient reports ongoing mild bruising (grade 1). She has had occasional episodes of hemorrhoidal bleeding. She denies any nosebleeds, blood in her urine, or symptoms of heartburn or gastritis. There has been no documentation of other gastrointestinal bleeding or intracranial hemorrhage.  She denies taking any aspirin or medications containing NSAIDs, and states that she takes only Tylenol as needed for pain. [name] BSN, RN, CCRP 12/14/2017 4:50 PM  12/16/2017 Medication received in pharmacy on 12/15/2017; per supplier, current medication supply, including patient's current supply at home, will expire 01/07/2018. A new supply with a later expiration date is  expected the week of 12/28/2017. At that time, an additional order will be placed by the [facility] pharmacist for a new resupply of unexpired medication to be shipped to patient. One bottle, received on 12/15/2017 shipped to patient today via overnight delivery, to begin after she finishes taking tablets from her current bottle. [name] BSN, RN, [place] 12/16/2017 3:06 PM  12/17/2017 See Telephone call encounter dated today. [name] BSN, RN, CCRP 12/17/2017 11:00 AM

## 2017-12-14 NOTE — Telephone Encounter (Signed)
Gave pt avs and calendar  °

## 2017-12-14 NOTE — Progress Notes (Signed)
Patient Care Team: Chipper Herb, MD as PCP - General (Family Medicine) Benson Norway, RN as Registered Nurse (Oncology) Nicholas Lose, MD as Consulting Physician (Hematology and Oncology) Coralie Keens, MD as Consulting Physician (General Surgery) Kyung Rudd, MD as Consulting Physician (Radiation Oncology) Sylvan Cheese, NP as Nurse Practitioner (Hematology and Oncology)  DIAGNOSIS:  Encounter Diagnoses  Name Primary?  . Malignant neoplasm of upper-outer quadrant of left breast in female, estrogen receptor positive ([place])   . Malignant neoplasm of upper-outer quadrant of left female breast ([place])   . Need for prophylactic vaccination and inoculation against influenza Yes    SUMMARY OF ONCOLOGIC HISTORY:   Malignant neoplasm of upper-outer quadrant of left breast in female, estrogen receptor positive ([place])   07/18/2014 Mammogram    Distortion left breast, breast density category C; U/S 1.3 x 0.8 x 0.8 cm left breast mass at 2:00 position 4 cm from nipple, no lymph nodes    07/20/2014 Initial Diagnosis    Left breast Biopsy: Invasive lobular cancer with LCIS, Grade 1, ER 86%, PR 78%, Her 2 Neg Ratio 1.77, Ki 67: 14%    08/01/2014 Breast MRI    Breast MRI showed non-mass enhancement 3.9 cm, no lymph nodes    08/01/2014 Clinical Stage    Stage IIA: T2 N0    08/08/2014 Surgery    Left breast lumpectomy: 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, with LCIS, medial and inferior margins positive, 3/4 lymph nodes positive    08/08/2014 Pathologic Stage    Stage IIB: T2 N1c M0    08/16/2014 Surgery    Left breast medial margin reexcision residual ILC 0.2 cm; inferior margin residual foci less than 0.2 cm, 1/5 lymph nodes positive, 1 lymph node with isolated tumor cells (Overall 4/10)    09/04/2014 - 01/15/2015 Chemotherapy    Adjuvant chemotherapy with dose dense Adriamycin and Cytoxan 4 followed by Abraxane weekly 8 ( stopped early for profound neutropenia and  thrombocytopenia)    01/29/2015 - 03/20/2015 Radiation Therapy    Adjuvant XRT [facility]): 50.4 Gy over 28 fractions; seroma boost: 10 Gy over 5 fractions. Total dose: 60.4 Gy    04/11/2015 -  Anti-estrogen oral therapy    Anastrozole 1 mg daily (PALLAS clinical trial 05/24/2015 patient was randomized to hormone therapy alone)    05/24/2015 Survivorship    Survivorship visit completed and copy of care plan given to patient    11/26/2016 PET scan    Small Left sided level 2 LN with hypermetabolism, post surg changes in breast and small right groin lymph node likely infectious/inflammatory, gallstones     CHIEF COMPLIANT: Follow-up on anastrozole therapy  INTERVAL HISTORY: [name] is a 57 year old with above-mentioned history of left breast cancer currently on oral antiestrogen therapy with anastrozole.  She is tolerating it extremely well.  She does not have any hot flashes or night sweats or myalgias.  She is concerned about her weight issues but she is quite content and happy about it.  REVIEW OF SYSTEMS:   Constitutional: Denies fevers, chills or abnormal weight loss Eyes: Denies blurriness of vision Ears, nose, mouth, throat, and face: Denies mucositis or sore throat Respiratory: Denies cough, dyspnea or wheezes Cardiovascular: Denies palpitation, chest discomfort Gastrointestinal:  Denies nausea, heartburn or change in bowel habits Skin: Denies abnormal skin rashes Lymphatics: Denies new lymphadenopathy or easy bruising Neurological:Denies numbness, tingling or new weaknesses Behavioral/Psych: Mood is stable, no new changes  Extremities: No lower extremity edema  All other systems were reviewed with the patient and are negative.  I have reviewed the past medical history, past surgical history, social history and family history with the patient and they are unchanged from previous note.  ALLERGIES:  is allergic to statins; crestor [rosuvastatin]; lipitor [atorvastatin]; and  temazepam.  MEDICATIONS:  Current Outpatient Medications  Medication Sig Dispense Refill  . acetaminophen (TYLENOL) 325 MG tablet Take 975 mg by mouth daily as needed for moderate pain. Usually takes twice daily in morning and bedtime and additionally during the day if needed for pain    . alendronate (FOSAMAX) 70 MG tablet Take 1 tablet (70 mg total) by mouth once a week. Take with a full glass of water on an empty stomach. 12 tablet 3  . anastrozole (ARIMIDEX) 1 MG tablet Take 1 tablet (1 mg total) by mouth daily. 90 tablet 3  . Biotin 1000 MCG tablet Take 1,000 mcg by mouth daily.    . cephALEXin (KEFLEX) 500 MG capsule Take 1 capsule (500 mg total) by mouth 3 (three) times daily. 21 capsule 0  . clonazePAM (KLONOPIN) 0.5 MG tablet Take 1 tablet (0.5 mg total) by mouth 3 (three) times daily as needed. 90 tablet 5  . donepezil (ARICEPT) 10 MG tablet Take 1 tablet (10 mg total) by mouth at bedtime. 90 tablet 3  . Investigational aspirin/placebo 100 MG tablet [facility] [id number] Take 1 tablet by mouth daily. Take with food or a full glass of water. Do not crush enteric coated tablets. 90 tablet 0  . Investigational aspirin/placebo 100 MG tablet Alliance [id number] Take 1 tablet by mouth daily. Take with food or a full glass of water. Do not crush enteric coated tablets. 90 tablet 0  . LORazepam (ATIVAN) 1 MG tablet TAKE 1 TABLET BY MOUTH AT BEDTIME 30 tablet 0  . PARoxetine (PAXIL-CR) 25 MG 24 hr tablet Take 1 tablet (25 mg total) by mouth daily. 90 tablet 3  . rosuvastatin (CRESTOR) 10 MG tablet Take 1 tablet (10 mg total) by mouth daily. 90 tablet 3  . Turmeric 500 MG TABS Take 1 tablet by mouth daily.    . Vitamin D, Ergocalciferol, (DRISDOL) 50000 units CAPS capsule TAKE 1 CAPSULE (50,000 UNITS TOTAL) BY MOUTH EVERY 7 (SEVEN) DAYS. 12 capsule 3   Current Facility-Administered Medications  Medication Dose Route Frequency Provider Last Rate Last Dose  . Influenza vac split quadrivalent PF (FLUARIX)  injection 0.5 mL  0.5 mL Intramuscular Once Nicholas Lose, MD        PHYSICAL EXAMINATION: ECOG PERFORMANCE STATUS: 1 - Symptomatic but completely ambulatory  Vitals:   12/14/17 0833  BP: (!) 147/80  Pulse: 66  Resp: 18  Temp: 98.9 F (37.2 C)  SpO2: 97%   Filed Weights   12/14/17 0833  Weight: 167 lb 9.6 oz (76 kg)    GENERAL:alert, no distress and comfortable SKIN: skin color, texture, turgor are normal, no rashes or significant lesions EYES: normal, Conjunctiva are pink and non-injected, sclera clear OROPHARYNX:no exudate, no erythema and lips, buccal mucosa, and tongue normal  NECK: supple, thyroid normal size, non-tender, without nodularity LYMPH:  no palpable lymphadenopathy in the cervical, axillary or inguinal LUNGS: clear to auscultation and percussion with normal breathing effort HEART: regular rate & rhythm and no murmurs and no lower extremity edema ABDOMEN:abdomen soft, non-tender and normal bowel sounds MUSCULOSKELETAL:no cyanosis of digits and no clubbing  NEURO: alert & oriented x 3 with fluent speech, no focal motor/sensory deficits EXTREMITIES: No lower  extremity edema BREAST: No palpable masses or nodules in either right or left breasts.  Scar tissue in the left breast and axilla from prior surgery slightly tender.  No palpable axillary supraclavicular or infraclavicular adenopathy no breast tenderness or nipple discharge. (exam performed in the presence of a chaperone)  LABORATORY DATA:  I have reviewed the data as listed CMP Latest Ref Rng & Units 10/15/2017 07/06/2017 04/16/2017  Glucose 65 - 99 mg/dL 106(H) 101 107(H)  BUN 6 - 24 mg/dL 8 7 10   Creatinine 0.57 - 1.00 mg/dL 0.54(L) 0.65 0.48(L)  Sodium 134 - 144 mmol/L 145(H) 142 142  Potassium 3.5 - 5.2 mmol/L 3.8 3.4(L) 4.2  Chloride 96 - 106 mmol/L 105 107 104  CO2 20 - 29 mmol/L 24 26 21   Calcium 8.7 - 10.2 mg/dL 8.8 9.1 9.0  Total Protein 6.0 - 8.5 g/dL 6.6 7.0 6.9  Total Bilirubin 0.0 - 1.2 mg/dL  0.4 0.8 0.3  Alkaline Phos 39 - 117 IU/L 72 71 76  AST 0 - 40 IU/L 20 22 20   ALT 0 - 32 IU/L 21 21 24     Lab Results  Component Value Date   WBC 5.3 12/14/2017   HGB 13.6 12/14/2017   HCT 39.9 12/14/2017   MCV 96.2 12/14/2017   PLT 182 12/14/2017   NEUTROABS 2.9 12/14/2017    ASSESSMENT & PLAN:  Malignant neoplasm of upper-outer quadrant of left breast in female, estrogen receptor positive ([place]) Left breast lumpectomy 08/08/2014: 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 Adjuvant chemotherapy with dose dense Adriamycin and Cytoxan x 4 followed by Abraxane weekly 12 because of lymph node positive disease. Started 09/04/14 to 01/15/15 2. adjuvant radiation completed 03/20/15 3. Followed by antiestrogen therapy started 04/03/15 -------------------------------------------------------------------------------------------------------------------------- Anastrozole toxicities: Patient denies any hot flashes. Weight gain Arthralgias and myalgias  Bone density 07/26/2015: T score -1.9, osteopenia  PALLAS Trial : Patient has been randomized to anastrozole alone. ABC clinical trial: The trial randomizes between aspirin versus placebo. She went on trial 07/26/2015 Currently on 100 mg of dosage. Bruising has become markedly reduced.  Adverse effects: 1. Bruising/ Hematoma: Resolved after dose reduction. She is currently going to be on 100 mg of aspirin/placebo 2. bleeding from mucous membranes especially GI, vaginal, nose, gums, nailbeds: Resolved  Cognitive dysfunction: OnAricept 10 mg daily. Memory function has  improvedsignificantly  Weight gain:On Weight Watchers, now doing Keto diet  Breast cancer surveillance: 1. M[facility] showed no evidence of malignancy breast density category C 2. Breast exam  12/14/2017: No palpable lumps or nodules of concern. 3.  CT CAP 10/28/2017: No evidence of malignancy, radiation changes anterior left upper lobe    Right-sided abdominal pain :Laparoscopic cholecystectomy10/29/2018 and lymph node biopsy: Reactive at [place] Flu shot to be given today. Pustule on the index finger distal phalanx: I gave her a prescription for Keflex.  Patient plans to bust it.  return to clinic in 6 months for follow-up    No orders of the defined types were placed in this encounter.  The patient has a good understanding of the overall plan. she agrees with it. she will  call with any problems that may develop before the next visit here.   Harriette Ohara, MD 12/14/17

## 2017-12-14 NOTE — Assessment & Plan Note (Signed)
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 Adjuvant chemotherapy with dose dense Adriamycin and Cytoxan x 4 followed by Abraxane weekly 12 because of lymph node positive disease. Started 09/04/14 to 01/15/15 2. adjuvant radiation completed 03/20/15 3. Followed by antiestrogen therapy started 04/03/15 -------------------------------------------------------------------------------------------------------------------------- Anastrozole toxicities: Patient denies any hot flashes. Weight gain Arthralgias and myalgias  Bone density 07/26/2015: T score -1.9, osteopenia  PALLAS Trial : Patient has been randomized to anastrozole alone. ABC clinical trial: The trial randomizes between aspirin versus placebo. She went on trial 07/26/2015 Currently on 100 mg of dosage. Bruising has become markedly reduced.  Adverse effects: 1. Bruising/ Hematoma: Resolved after dose reduction. She is currently going to be on 100 mg of aspirin/placebo 2. bleeding from mucous membranes especially GI, vaginal, nose, gums, nailbeds: Resolved  Cognitive dysfunction: OnAricept 10 mg daily. Memory function has improvedsignificantly  Weight gain:On Weight Watchers, now doing Keto diet  Breast cancer surveillance: 1. Mammogram done 07/22/2017 at [facility] showed no evidence of malignancy breast density category C 2. Breast exam  12/14/2017: No palpable lumps or nodules of concern. 3.  CT CAP 10/28/2017: No evidence of malignancy, radiation changes anterior left upper lobe    Right-sided abdominal pain :Laparoscopic  cholecystectomy10/29/2018 and lymph node biopsy: Reactive at [place]  return to clinic in 6 months for follow-up 

## 2017-12-17 ENCOUNTER — Telehealth: Payer: Self-pay | Admitting: *Deleted

## 2017-12-17 ENCOUNTER — Other Ambulatory Visit: Payer: Self-pay | Admitting: Hematology and Oncology

## 2017-12-17 MED ORDER — INV-ASPIRIN/PLACEBO 100 MG TABS ALLIANCE A011502: 1.0000 | ORAL_TABLET | Freq: Every day | ORAL | 0 refills | Status: DC

## 2017-12-17 NOTE — Telephone Encounter (Signed)
12/17/2017 Spoke with patient by phone who confirms receipt of one bottle of study medication. Explained upcoming expiration date of 01/07/2018 for existing supply, explaining to patient that one supplier ships additional drug to our site, expected the week of 12/28/17, we will be sending two new bottles of study medication to begin on 01/08/2018, replacing existing supply. At that time, patient instructed that she should retain her current bottle, which will be empty next week, and the bottle received today, which will be almost full, and keep them sealed separately marked so that she does not take the expired drug, after 01/07/18. Patient requested to keep the medication in a safe place until she can return it at her next clinic visit. Patient verbalized understanding. [name] BSN, RN, CCRP 12/17/2017 10:59 AM

## 2017-12-28 ENCOUNTER — Encounter: Payer: Self-pay | Admitting: Internal Medicine

## 2017-12-28 ENCOUNTER — Telehealth: Payer: Self-pay | Admitting: Family Medicine

## 2017-12-28 NOTE — Telephone Encounter (Signed)
May do referral to Dr. Daryll Brod.  May need arthritis panel prior to that visit with x-rays of both hands if the patient desires this.

## 2017-12-28 NOTE — Addendum Note (Signed)
Addended by: Zannie Cove on: 12/28/2017 12:39 PM   Modules accepted: Orders

## 2017-12-28 NOTE — Telephone Encounter (Signed)
Pt calling back to check on this - can we refer?

## 2017-12-28 NOTE — Telephone Encounter (Signed)
Referral discussed - hand

## 2017-12-28 NOTE — Telephone Encounter (Signed)
Pt aware - she prefers to do this with the specialist (xray and labs)

## 2018-01-04 DIAGNOSIS — M19042 Primary osteoarthritis, left hand: Secondary | ICD-10-CM | POA: Insufficient documentation

## 2018-01-05 ENCOUNTER — Other Ambulatory Visit: Payer: Self-pay | Admitting: Orthopedic Surgery

## 2018-01-06 ENCOUNTER — Other Ambulatory Visit: Payer: Self-pay

## 2018-01-06 ENCOUNTER — Encounter (HOSPITAL_BASED_OUTPATIENT_CLINIC_OR_DEPARTMENT_OTHER): Payer: Self-pay | Admitting: *Deleted

## 2018-01-07 ENCOUNTER — Other Ambulatory Visit: Payer: Self-pay | Admitting: Hematology and Oncology

## 2018-01-07 ENCOUNTER — Other Ambulatory Visit: Payer: Self-pay | Admitting: *Deleted

## 2018-01-07 DIAGNOSIS — C50412 Malignant neoplasm of upper-outer quadrant of left female breast: Secondary | ICD-10-CM

## 2018-01-08 ENCOUNTER — Encounter (HOSPITAL_BASED_OUTPATIENT_CLINIC_OR_DEPARTMENT_OTHER): Payer: Self-pay | Admitting: Anesthesiology

## 2018-01-08 ENCOUNTER — Ambulatory Visit (HOSPITAL_BASED_OUTPATIENT_CLINIC_OR_DEPARTMENT_OTHER): Payer: PRIVATE HEALTH INSURANCE | Admitting: Anesthesiology

## 2018-01-08 ENCOUNTER — Ambulatory Visit (HOSPITAL_BASED_OUTPATIENT_CLINIC_OR_DEPARTMENT_OTHER)
Admission: RE | Admit: 2018-01-08 | Discharge: 2018-01-08 | Disposition: A | Discharge status: Home / Self Care | Payer: PRIVATE HEALTH INSURANCE | Source: Ambulatory Visit | Attending: Orthopedic Surgery | Admitting: Orthopedic Surgery

## 2018-01-08 ENCOUNTER — Telehealth: Payer: Self-pay | Admitting: *Deleted

## 2018-01-08 ENCOUNTER — Encounter (HOSPITAL_BASED_OUTPATIENT_CLINIC_OR_DEPARTMENT_OTHER)
Admission: RE | Disposition: A | Discharge status: Home / Self Care | Payer: Self-pay | Source: Ambulatory Visit | Attending: Orthopedic Surgery

## 2018-01-08 DIAGNOSIS — Z853 Personal history of malignant neoplasm of breast: Secondary | ICD-10-CM | POA: Diagnosis not present

## 2018-01-08 DIAGNOSIS — M25742 Osteophyte, left hand: Secondary | ICD-10-CM | POA: Insufficient documentation

## 2018-01-08 DIAGNOSIS — M19042 Primary osteoarthritis, left hand: Secondary | ICD-10-CM | POA: Insufficient documentation

## 2018-01-08 DIAGNOSIS — M67442 Ganglion, left hand: Secondary | ICD-10-CM | POA: Diagnosis not present

## 2018-01-08 HISTORY — PX: MASS EXCISION: SHX2000

## 2018-01-08 SURGERY — EXCISION MASS
Anesthesia: General | Site: Finger | Laterality: Left

## 2018-01-08 MED ORDER — LACTATED RINGERS IV SOLN
INTRAVENOUS | Status: DC
  Administered 2018-01-08: 12:00:00 via INTRAVENOUS

## 2018-01-08 MED ORDER — FENTANYL CITRATE (PF) 100 MCG/2ML IJ SOLN
50.0000 ug | INTRAMUSCULAR | Status: DC | PRN
  Administered 2018-01-08 (×2): 50 ug via INTRAVENOUS

## 2018-01-08 MED ORDER — EPHEDRINE 5 MG/ML INJ
INTRAVENOUS | Status: AC
  Filled 2018-01-08: qty 10

## 2018-01-08 MED ORDER — PHENYLEPHRINE 40 MCG/ML (10ML) SYRINGE FOR IV PUSH (FOR BLOOD PRESSURE SUPPORT)
PREFILLED_SYRINGE | INTRAVENOUS | Status: AC
  Filled 2018-01-08: qty 10

## 2018-01-08 MED ORDER — MEPERIDINE HCL 25 MG/ML IJ SOLN: 6.2500 mg | INTRAMUSCULAR | Status: DC | PRN

## 2018-01-08 MED ORDER — OXYCODONE HCL 5 MG/5ML PO SOLN: 5.0000 mg | Freq: Once | ORAL | Status: DC | PRN

## 2018-01-08 MED ORDER — CHLORHEXIDINE GLUCONATE 4 % EX LIQD: 60.0000 mL | Freq: Once | CUTANEOUS | Status: DC

## 2018-01-08 MED ORDER — BUPIVACAINE HCL (PF) 0.25 % IJ SOLN
INTRAMUSCULAR | Status: DC | PRN
  Administered 2018-01-08: 7 mL

## 2018-01-08 MED ORDER — SCOPOLAMINE 1 MG/3DAYS TD PT72: 1.0000 | MEDICATED_PATCH | Freq: Once | TRANSDERMAL | Status: DC | PRN

## 2018-01-08 MED ORDER — OXYCODONE HCL 5 MG PO TABS: 5.0000 mg | ORAL_TABLET | Freq: Once | ORAL | Status: DC | PRN

## 2018-01-08 MED ORDER — PROMETHAZINE HCL 25 MG/ML IJ SOLN: 6.2500 mg | INTRAMUSCULAR | Status: DC | PRN

## 2018-01-08 MED ORDER — MIDAZOLAM HCL 2 MG/2ML IJ SOLN
INTRAMUSCULAR | Status: AC
  Filled 2018-01-08: qty 2

## 2018-01-08 MED ORDER — FENTANYL CITRATE (PF) 100 MCG/2ML IJ SOLN
INTRAMUSCULAR | Status: AC
  Filled 2018-01-08: qty 2

## 2018-01-08 MED ORDER — ONDANSETRON HCL 4 MG/2ML IJ SOLN
INTRAMUSCULAR | Status: AC
  Filled 2018-01-08: qty 2

## 2018-01-08 MED ORDER — TRAMADOL HCL 50 MG PO TABS: 50.0000 mg | ORAL_TABLET | Freq: Four times a day (QID) | ORAL | 0 refills | Status: DC | PRN

## 2018-01-08 MED ORDER — KETAMINE HCL 50 MG/ML IJ SOLN: 1.5000 mg/kg | Freq: Once | INTRAMUSCULAR | Status: DC

## 2018-01-08 MED ORDER — FENTANYL CITRATE (PF) 100 MCG/2ML IJ SOLN: 25.0000 ug | INTRAMUSCULAR | Status: DC | PRN

## 2018-01-08 MED ORDER — PROPOFOL 10 MG/ML IV BOLUS
INTRAVENOUS | Status: DC | PRN
  Administered 2018-01-08: 150 mg via INTRAVENOUS
  Administered 2018-01-08: 50 mg via INTRAVENOUS

## 2018-01-08 MED ORDER — PROPOFOL 500 MG/50ML IV EMUL: INTRAVENOUS | Status: DC | PRN

## 2018-01-08 MED ORDER — MIDAZOLAM HCL 2 MG/2ML IJ SOLN
1.0000 mg | INTRAMUSCULAR | Status: DC | PRN
  Administered 2018-01-08: 2 mg via INTRAVENOUS

## 2018-01-08 MED ORDER — BUPIVACAINE HCL (PF) 0.25 % IJ SOLN
INTRAMUSCULAR | Status: AC
  Filled 2018-01-08: qty 30

## 2018-01-08 MED ORDER — CEFAZOLIN SODIUM-DEXTROSE 2-4 GM/100ML-% IV SOLN
2.0000 g | INTRAVENOUS | Status: AC
  Administered 2018-01-08: 2 g via INTRAVENOUS

## 2018-01-08 MED ORDER — CEFAZOLIN SODIUM-DEXTROSE 2-4 GM/100ML-% IV SOLN
INTRAVENOUS | Status: AC
  Filled 2018-01-08: qty 100

## 2018-01-08 MED ORDER — PROPOFOL 500 MG/50ML IV EMUL
INTRAVENOUS | Status: AC
  Filled 2018-01-08: qty 50

## 2018-01-08 MED ORDER — INV-ASPIRIN/PLACEBO 100 MG TABS ALLIANCE A011502: 1.0000 | ORAL_TABLET | Freq: Every day | ORAL | 0 refills | Status: DC

## 2018-01-08 MED ORDER — ONDANSETRON HCL 4 MG/2ML IJ SOLN
INTRAMUSCULAR | Status: DC | PRN
  Administered 2018-01-08: 4 mg via INTRAVENOUS

## 2018-01-08 MED ORDER — LIDOCAINE 2% (20 MG/ML) 5 ML SYRINGE
INTRAMUSCULAR | Status: AC
  Filled 2018-01-08: qty 5

## 2018-01-08 MED ORDER — SUCCINYLCHOLINE CHLORIDE 200 MG/10ML IV SOSY
PREFILLED_SYRINGE | INTRAVENOUS | Status: AC
  Filled 2018-01-08: qty 10

## 2018-01-08 SURGICAL SUPPLY — 47 items
BANDAGE COBAN STERILE 2 (GAUZE/BANDAGES/DRESSINGS) IMPLANT
BLADE SURG 15 STRL LF DISP TIS (BLADE) ×1 IMPLANT
BLADE SURG 15 STRL SS (BLADE) ×1
BNDG COHESIVE 1X5 TAN STRL LF (GAUZE/BANDAGES/DRESSINGS) ×2 IMPLANT
BNDG COHESIVE 3X5 TAN STRL LF (GAUZE/BANDAGES/DRESSINGS) ×2 IMPLANT
BNDG ESMARK 4X9 LF (GAUZE/BANDAGES/DRESSINGS) IMPLANT
BNDG GAUZE ELAST 4 BULKY (GAUZE/BANDAGES/DRESSINGS) IMPLANT
CHLORAPREP W/TINT 26ML (MISCELLANEOUS) ×2 IMPLANT
CORD BIPOLAR FORCEPS 12FT (ELECTRODE) ×2 IMPLANT
COVER BACK TABLE 60X90IN (DRAPES) ×2 IMPLANT
COVER MAYO STAND STRL (DRAPES) ×2 IMPLANT
COVER WAND RF STERILE (DRAPES) IMPLANT
CUFF TOURNIQUET SINGLE 18IN (TOURNIQUET CUFF) IMPLANT
DECANTER SPIKE VIAL GLASS SM (MISCELLANEOUS) IMPLANT
DRAIN PENROSE 1/2X12 LTX STRL (WOUND CARE) ×2 IMPLANT
DRAPE EXTREMITY T 121X128X90 (DRAPE) ×2 IMPLANT
DRAPE SURG 17X23 STRL (DRAPES) ×2 IMPLANT
GAUZE SPONGE 4X4 12PLY STRL (GAUZE/BANDAGES/DRESSINGS) ×2 IMPLANT
GAUZE XEROFORM 1X8 LF (GAUZE/BANDAGES/DRESSINGS) ×2 IMPLANT
GLOVE BIOGEL PI IND STRL 7.0 (GLOVE) ×1 IMPLANT
GLOVE BIOGEL PI IND STRL 8.5 (GLOVE) ×1 IMPLANT
GLOVE BIOGEL PI INDICATOR 7.0 (GLOVE) ×1
GLOVE BIOGEL PI INDICATOR 8.5 (GLOVE) ×1
GLOVE SURG ORTHO 8.0 STRL STRW (GLOVE) ×2 IMPLANT
GLOVE SURG SYN 7.5  E (GLOVE) ×1
GLOVE SURG SYN 7.5 E (GLOVE) ×1 IMPLANT
GOWN STRL REUS W/ TWL LRG LVL3 (GOWN DISPOSABLE) IMPLANT
GOWN STRL REUS W/ TWL XL LVL3 (GOWN DISPOSABLE) ×1 IMPLANT
GOWN STRL REUS W/TWL LRG LVL3 (GOWN DISPOSABLE)
GOWN STRL REUS W/TWL XL LVL3 (GOWN DISPOSABLE) ×3 IMPLANT
NDL SAFETY ECLIPSE 18X1.5 (NEEDLE) IMPLANT
NEEDLE HYPO 18GX1.5 SHARP (NEEDLE)
NEEDLE PRECISIONGLIDE 27X1.5 (NEEDLE) ×2 IMPLANT
NS IRRIG 1000ML POUR BTL (IV SOLUTION) ×2 IMPLANT
PACK BASIN DAY SURGERY FS (CUSTOM PROCEDURE TRAY) ×2 IMPLANT
PAD CAST 3X4 CTTN HI CHSV (CAST SUPPLIES) IMPLANT
PADDING CAST COTTON 3X4 STRL (CAST SUPPLIES)
SPLINT FINGER 3.25 911903 (SOFTGOODS) ×2 IMPLANT
SPLINT PLASTER CAST XFAST 3X15 (CAST SUPPLIES) IMPLANT
SPLINT PLASTER XTRA FASTSET 3X (CAST SUPPLIES)
STOCKINETTE 4X48 STRL (DRAPES) ×2 IMPLANT
SUT ETHILON 4 0 PS 2 18 (SUTURE) ×2 IMPLANT
SUT VIC AB 4-0 P2 18 (SUTURE) IMPLANT
SYR BULB 3OZ (MISCELLANEOUS) ×2 IMPLANT
SYR CONTROL 10ML LL (SYRINGE) ×2 IMPLANT
TOWEL GREEN STERILE FF (TOWEL DISPOSABLE) ×2 IMPLANT
UNDERPAD 30X30 (UNDERPADS AND DIAPERS) ×2 IMPLANT

## 2018-01-08 NOTE — H&P (Signed)
[name] is an 57 y.o. female.   Chief Complaint: mucoid tumor left middle fingerHPI: [name] is a 57 year old right-hand-dominant female referred by Dr. Morrie Sheldon for consultation regarding a mass in the dorsal aspect left middle finger. This states is been present for approximately 1 year enlarging over the past 3 months she has had it drained or injected 2 times only to see this record recur. The last time was approximately 3 weeks ago she was placed on an antibiotic which is due to run out after the next 3 doses. She has no history of injury. She complains of sharp pain especially with a hitting it with a VAS score 5/10. Basically drained a clear watery fluid. She has no history of diabetes thyroid problems arthritis gout. Family history is negative for each of these also.   Past Medical History:  Diagnosis Date  . Abnormal Pap smear of cervix 2002   Dr. Eilleen Kempf   . Allergy 2012   seasonal  . Anxiety   . Benign hemangioma    Dr,  Marybelle Killings   . Breast cancer ([place]) 07/20/14   Left Breast  . Cervical stenosis (uterine cervix) 2002  . Depression   . Dizziness 1991   secondary to Meniere's disease  . Hyperlipidemia 2007  . Insomnia 1997  . Meniere's disease 1991  . Nervousness(799.21) 1987    Past Surgical History:  Procedure Laterality Date  . AXILLARY LYMPH NODE DISSECTION Left 08/16/2014   Procedure: AXILLARY LYMPH NODE DISSECTION;  Surgeon: Coralie Keens, MD;  Location: [place];  Service: General;  Laterality: Left;  . bilateral  tubal ligation  05/08/2004  . BREAST LUMPECTOMY WITH RADIOACTIVE SEED AND SENTINEL LYMPH NODE BIOPSY Left 08/08/2014   Procedure: BREAST LUMPECTOMY WITH RADIOACTIVE SEED AND SENTINEL LYMPH NODE BIOPSY;  Surgeon: Coralie Keens, MD;  Location: [place];  Service: General;  Laterality: Left;  . CHOLECYSTECTOMY  12/30/2016  . MASTOPEXY Right 12/14/2015   Procedure: RIGHT BREAST MASTOPEXY;  Surgeon: Irene Limbo, MD;  Location: [place];  Service: Plastics;  Laterality: Right;  . PORT-A-CATH REMOVAL N/A 03/08/2015   Procedure: REMOVAL PORT-A-CATH;  Surgeon: Coralie Keens, MD;  Location: [facility];  Service: General;  Laterality: N/A;  . PORTACATH PLACEMENT Right 08/16/2014   Procedure: INSERTION PORT-A-CATH;  Surgeon: Coralie Keens, MD;  Location: [place];  Service: General;  Laterality: Right;  . RE-EXCISION OF BREAST LUMPECTOMY Left 08/16/2014   Procedure: RE-EXCISION OF LEFT BREAST CANCER AND PORT PLACEMENT/POSSIBLE AXILLARY DISECTION;  Surgeon: Coralie Keens, MD;  Location: [place];  Service: General;  Laterality: Left;  . right breast surg     [place] Right 07/28/2012   Procedure: RIGHT SHOULDER DECOMPRESSION AND EXCISION OF CALCIFIC DEPOSIT;  Surgeon: Tobi Bastos, MD;  Location: [facility];  Service: Orthopedics;  Laterality: Right;  . TUBAL LIGATION    . uterine ablation  02/2006    Due to heavy periods Dr. Jon Billings   . wisdom teeth extractions  yrs ago    Family History  Problem Relation Age of Onset  . Cirrhosis Father   . Colon polyps Mother   . Colon cancer Neg Hx    Social History:  reports that she has never smoked. She has never used smokeless tobacco. She reports that she drinks alcohol. She reports that she does not use drugs.  Allergies:  Allergies  Allergen Reactions  . Statins Other (  See Comments)  . Crestor [Rosuvastatin] Other (See Comments)    myalgias  . Lipitor [Atorvastatin] Other (See Comments)    myalgias  . Temazepam Other (See Comments)    Pt does not remember (Restoril)    No medications prior to admission.    No results found for this or any previous visit (from the past 48 hour(s)).  No results found.   Pertinent items are noted in HPI.  Height 5\' 5"  (1.651 m), weight 75.8 kg.  General appearance: alert, cooperative and appears stated  age Head: Normocephalic, without obvious abnormality Neck: no JVD Resp: clear to auscultation bilaterally Cardio: regular rate and rhythm, S1, S2 normal, no murmur, click, rub or gallop GI: soft, non-tender; bowel sounds normal; no masses,  no organomegaly Extremities: mocoid cyst left middle finger Pulses: 2+ and symmetric Skin: Skin color, texture, turgor normal. No rashes or lesions Neurologic: Grossly normal Incision/Wound: na  Assessment/Plan Assessment:  1. 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 Osteoarthritis of finger of left hand    Plan: Have discussed the etiology of the problem with her. We have discussed possibility of surgical excision debridement of the joint. She is advised that there is no guarantee with surgery the possibility of infection recurrence injury to arteries nerves tendons complete relief symptoms dystrophy. She is advised that there is a potential for recurrence. She is advised to continue her antibiotic until this runs out. She will be scheduled for excision mucoid cyst debridement distal phalangeal joint left middle finger as an outpatient    Daryll Brod 01/08/2018, 7:41 AM

## 2018-01-08 NOTE — Telephone Encounter (Signed)
01/07/2018 Spoke with patient by phone to confirm receipt of shipment of investigational aspirin 100mg /placebo. Patient states that she did receive the package but had not yet opened it. Reminded patient of previous discussion regarding expiration date of today for her current supply. Patient was instructed to save her current bottles (empty and partial) but not to take any further doses from that supply after today. Beginning tomorrow, patient is to begin taking doses from the new bottle she received today. Patient voiced understanding. Due to increased supply of investigational drug, will rescheduled April 2020 appointments slightly later to fall in window per protocol requirements. [name] BSN, RN, [place] 01/08/2018 4:26 PM

## 2018-01-08 NOTE — Discharge Instructions (Addendum)

## 2018-01-08 NOTE — Anesthesia Preprocedure Evaluation (Addendum)
Anesthesia Evaluation  Patient identified by MRN, date of birth, ID band Patient awake    Reviewed: Allergy & Precautions, NPO status , Patient's Chart, lab work & pertinent test results  Airway Mallampati: II  TM Distance: >3 FB Neck ROM: Full    Dental no notable dental hx.    Pulmonary neg pulmonary ROS,    Pulmonary exam normal breath sounds clear to auscultation       Cardiovascular negative cardio ROS Normal cardiovascular exam Rhythm:Regular Rate:Normal     Neuro/Psych Anxiety negative neurological ROS     GI/Hepatic negative GI ROS, Neg liver ROS,   Endo/Other  negative endocrine ROS  Renal/GU negative Renal ROS  negative genitourinary   Musculoskeletal negative musculoskeletal ROS (+)   Abdominal   Peds negative pediatric ROS (+)  Hematology negative hematology ROS (+)   Anesthesia Other Findings   Reproductive/Obstetrics negative OB ROS                             Anesthesia Physical  Anesthesia Plan  ASA: II  Anesthesia Plan: General   Post-op Pain Management:    Induction: Intravenous  PONV Risk Score and Plan: 2 and Ondansetron, Propofol infusion, Dexamethasone and Midazolam  Airway Management Planned: LMA  Additional Equipment: None  Intra-op Plan:   Post-operative Plan: Extubation in OR  Informed Consent: I have reviewed the patients History and Physical, chart, labs and discussed the procedure including the risks, benefits and alternatives for the proposed anesthesia with the patient or authorized representative who has indicated his/her understanding and acceptance.   Dental advisory given  Plan Discussed with: CRNA and Surgeon  Anesthesia Plan Comments:        Anesthesia Quick Evaluation

## 2018-01-08 NOTE — Anesthesia Procedure Notes (Signed)
Procedure Name: LMA Insertion Date/Time: 01/08/2018 12:46 PM Performed by: Willa Frater, CRNA Pre-anesthesia Checklist: Patient identified, Emergency Drugs available, Suction available and Patient being monitored Patient Re-evaluated:Patient Re-evaluated prior to induction Oxygen Delivery Method: Circle system utilized Preoxygenation: Pre-oxygenation with 100% oxygen Induction Type: IV induction Ventilation: Mask ventilation without difficulty LMA: LMA inserted LMA Size: 4.0 Number of attempts: 1 Airway Equipment and Method: Bite block Placement Confirmation: positive ETCO2 Tube secured with: Tape Dental Injury: Teeth and Oropharynx as per pre-operative assessment

## 2018-01-08 NOTE — Brief Op Note (Signed)
01/08/2018  1:16 PM  PATIENT:  [name]  57 y.o. female  PRE-OPERATIVE DIAGNOSIS:  MUCOID TUMOR LEFT MIDDLE FINGER, DEGENERATIVE JOINT DISEASE DISTAL INTERPHALANGEAL JOINT  POST-OPERATIVE DIAGNOSIS:  MUCOID TUMOR LEFT MIDDLE FINGER, DEGENERATIVE JOINT DISEASE DISTAL INTERPHALANGEAL JOINT  PROCEDURE:  Procedure(s): EXCISION MUCOID CYST, DEBRIDEMENT DISTAL INTERPHALANGEAL LEFT MIDDLE FINGER (Left)  SURGEON:  Surgeon(s) and Role:    * Daryll Brod, MD - Primary  PHYSICIAN ASSISTANT:   ASSISTANTS: none   ANESTHESIA:   local and general  EBL: 58ml  BLOOD ADMINISTERED:none  DRAINS: none   LOCAL MEDICATIONS USED:  BUPIVICAINE   SPECIMEN:  Excision  DISPOSITION OF SPECIMEN:  PATHOLOGY  COUNTS:  YES  TOURNIQUET:  * Missing tourniquet times found for documented tourniquets in log: [id number] *  DICTATION: .Dragon Dictation  PLAN OF CARE: Discharge to home after PACU  PATIENT DISPOSITION:  PACU - hemodynamically stable.

## 2018-01-08 NOTE — Op Note (Signed)
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   ASSISTANT: none   ANESTHESIA:  General and Local   INTRAVENOUS FLUIDS:  Per anesthesia flow sheet.   ESTIMATED BLOOD LOSS:  Minimal.   COMPLICATIONS:  None.   SPECIMENS:   Mucoid cyst and osteophytes   TOURNIQUET TIME:   * Missing tourniquet times found for documented tourniquets in log: [id number] *   DISPOSITION:  Stable to PACU.   INDICATIONS: Patient is a 57 year old female with a large mucoid cyst on her left middle finger distal interphalangeal joint x-rays reveal arthritic changes beneath this is not responded to conservative treatment she is like to undergo surgical excision with debridement of the joint pre-peri-and postoperative course been discussed along with risks and complications.  She is aware that there is no guarantee to the surgery the possibility of infection recurrence injury to arteries nerves tendons complete release symptoms dystrophy.  Very the patient is seen extremity marked by both patient and surgeon antibiotic given  OPERATIVE COURSE: He was brought to the operating room where general anesthetic was carried out without difficulty under the direction the anesthesia department.  She was prepped using ChloraPrep in the supine position with left arm free.  A timeout taken with forming patient procedure.  A metacarpal block was given quarter percent bupivacaine without epinephrine approximately 6 cc was used.  A Penrose drain was used to return controlled at the base of  the finger.  A curvilinear incision was made over the dorsal aspect of the distal interphalangeal joint left middle finger.  This carried down through subcutaneous tissue.  The cyst was immediately encountered this was excised with blunt sharp dissection sent to pathology.  The joint was then opened in its radial aspect.  Very significant degenerative changes were noted in the joint osteophytes on the dorsal aspect of the middle phalanx were then removed and hemostatic rondure and a house curette.  Specimen was sent to pathology.  The wound was copiously irrigated with saline.  The skin was closed with interrupted 4-0 nylon sutures.  Sterile dressing and splint was applied.  Tourniquet was removed.  The patient was taken to the recovery room for observation in satisfactory condition.  She will be discharged home to return to the hand center [facility]  in 1 week on Tylenol and Ultram for breakthrough.   Daryll Brod, MD Electronically signed, 01/08/18

## 2018-01-08 NOTE — Anesthesia Postprocedure Evaluation (Signed)
Anesthesia Post Note  Patient: [name]  Procedure(s) Performed: EXCISION MUCOID CYST, DEBRIDEMENT DISTAL INTERPHALANGEAL LEFT MIDDLE FINGER (Left Finger)     Patient location during evaluation: PACU Anesthesia Type: General Level of consciousness: sedated and patient cooperative Pain management: pain level controlled Vital Signs Assessment: post-procedure vital signs reviewed and stable Respiratory status: spontaneous breathing Cardiovascular status: stable Anesthetic complications: no    Last Vitals:  Vitals:   01/08/18 1342 01/08/18 1358  BP:  (!) 150/84  Pulse: 67 67  Resp: 14 16  Temp:  36.7 C  SpO2: 95% 96%    Last Pain:  Vitals:   01/08/18 1358  TempSrc:   PainSc: 0-No pain                 Nolon Nations

## 2018-01-08 NOTE — Transfer of Care (Signed)
Immediate Anesthesia Transfer of Care Note  Patient: [name]  Procedure(s) Performed: EXCISION MUCOID CYST, DEBRIDEMENT DISTAL INTERPHALANGEAL LEFT MIDDLE FINGER (Left Finger)  Patient Location: PACU  Anesthesia Type:General  Level of Consciousness: awake, alert , oriented and drowsy  Airway & Oxygen Therapy: Patient Spontanous Breathing and Patient connected to face mask oxygen  Post-op Assessment: Report given to RN and Post -op Vital signs reviewed and stable  Post vital signs: Reviewed and stable  Last Vitals:  Vitals Value Taken Time  BP 147/81 01/08/2018  1:21 PM  Temp    Pulse 69 01/08/2018  1:23 PM  Resp 11 01/08/2018  1:23 PM  SpO2 98 % 01/08/2018  1:23 PM  Vitals shown include unvalidated device data.  Last Pain:  Vitals:   01/08/18 1133  TempSrc: Oral  PainSc: 2          Complications: No apparent anesthesia complications

## 2018-01-11 ENCOUNTER — Encounter (HOSPITAL_BASED_OUTPATIENT_CLINIC_OR_DEPARTMENT_OTHER): Payer: Self-pay | Admitting: Orthopedic Surgery

## 2018-02-01 ENCOUNTER — Encounter: Payer: Self-pay | Admitting: Internal Medicine

## 2018-02-12 ENCOUNTER — Ambulatory Visit (AMBULATORY_SURGERY_CENTER): Payer: Self-pay | Admitting: *Deleted

## 2018-02-12 VITALS — Ht 65.0 in | Wt 163.6 lb

## 2018-02-12 DIAGNOSIS — Z86010 Personal history of colonic polyps: Secondary | ICD-10-CM

## 2018-02-12 NOTE — Progress Notes (Signed)
No egg or soy allergy  No home oxygen used or hx of sleep apnea  No diet medications taken  No anesthesia problems or intubation problems per pt  Suprep sample given

## 2018-02-15 ENCOUNTER — Encounter: Payer: Self-pay | Admitting: Internal Medicine

## 2018-02-18 ENCOUNTER — Encounter: Payer: Self-pay | Admitting: Internal Medicine

## 2018-02-18 ENCOUNTER — Ambulatory Visit (AMBULATORY_SURGERY_CENTER): Payer: PRIVATE HEALTH INSURANCE | Admitting: Internal Medicine

## 2018-02-18 VITALS — BP 133/71 | HR 62 | Temp 98.6°F | Resp 11 | Ht 65.0 in | Wt 163.0 lb

## 2018-02-18 DIAGNOSIS — D120 Benign neoplasm of cecum: Secondary | ICD-10-CM

## 2018-02-18 DIAGNOSIS — K635 Polyp of colon: Secondary | ICD-10-CM

## 2018-02-18 DIAGNOSIS — D125 Benign neoplasm of sigmoid colon: Secondary | ICD-10-CM

## 2018-02-18 DIAGNOSIS — Z860100 Personal history of colon polyps, unspecified: Secondary | ICD-10-CM

## 2018-02-18 DIAGNOSIS — D123 Benign neoplasm of transverse colon: Secondary | ICD-10-CM

## 2018-02-18 HISTORY — PX: COLONOSCOPY: SHX174

## 2018-02-18 MED ORDER — SODIUM CHLORIDE 0.9 % IV SOLN: 500.0000 mL | Freq: Once | INTRAVENOUS | Status: DC

## 2018-02-18 NOTE — Progress Notes (Signed)
Pt's states no medical or surgical changes since previsit or office visit. 

## 2018-02-18 NOTE — Op Note (Signed)
[place] Patient Name: [name] Procedure Date: 02/18/2018 11:19 AM MRN: [id number] Endoscopist: Docia Chuck. Henrene Pastor , MD Age: 57 Referring MD:  Date of Birth: February 11, 1961 Gender: Female Account #: [id number] Procedure:                Colonoscopy with cold snare polypectomy x 4 Indications:              High risk colon cancer surveillance: Personal                            history of non-advanced adenoma. Index examination                            September 2014 Medicines:                Monitored Anesthesia Care Procedure:                Pre-Anesthesia Assessment:                           - Prior to the procedure, a History and Physical                            was performed, and patient medications and                            allergies were reviewed. The patient's tolerance of                            previous anesthesia was also reviewed. The risks                            and benefits of the procedure and the sedation                            options and risks were discussed with the patient.                            All questions were answered, and informed consent                            was obtained. Prior Anticoagulants: The patient has                            taken no previous anticoagulant or antiplatelet                            agents. ASA Grade Assessment: II - A patient with                            mild systemic disease. After reviewing the risks                            and benefits, the patient was deemed in  satisfactory condition to undergo the procedure.                           After obtaining informed consent, the colonoscope                            was passed under direct vision. Throughout the                            procedure, the patient's blood pressure, pulse, and                            oxygen saturations were monitored continuously. The                            Colonoscope  was introduced through the anus and                            advanced to the the cecum, identified by                            appendiceal orifice and ileocecal valve. The                            ileocecal valve, appendiceal orifice, and rectum                            were photographed. The quality of the bowel                            preparation was excellent. The colonoscopy was                            performed without difficulty. The patient tolerated                            the procedure well. The bowel preparation used was                            SUPREP. Scope In: 11:41:12 AM Scope Out: 11:56:04 AM Scope Withdrawal Time: 0 hours 11 minutes 22 seconds  Total Procedure Duration: 0 hours 14 minutes 52 seconds  Findings:                 Four polyps were found in the sigmoid colon,                            transverse colon and cecum. The polyps were 2 to 5                            mm in size. These polyps were removed with a cold                            snare. Resection and retrieval were complete.  The exam was otherwise without abnormality on                            direct and retroflexion views. Complications:            No immediate complications. Estimated blood loss:                            None. Estimated Blood Loss:     Estimated blood loss: none. Impression:               - Four 2 to 5 mm polyps in the sigmoid colon, in                            the transverse colon and in the cecum, removed with                            a cold snare. Resected and retrieved.                           - The examination was otherwise normal on direct                            and retroflexion views. Recommendation:           - Repeat colonoscopy in 3 or 5 years for                            surveillance based on final pathology.                           - Patient has a contact number available for                             emergencies. The signs and symptoms of potential                            delayed complications were discussed with the                            patient. Return to normal activities tomorrow.                            Written discharge instructions were provided to the                            patient.                           - Resume previous diet.                           - Continue present medications.                           - Await pathology results. Docia Chuck. Henrene Pastor, MD 02/18/2018 12:02:42  PM This report has been signed electronically.

## 2018-02-18 NOTE — Patient Instructions (Signed)
Await pathology results. Continue present medications. Please read handout on polyps.     YOU HAD AN ENDOSCOPIC PROCEDURE TODAY AT THE [facility]:   Refer to the procedure report that was given to you for any specific questions about what was found during the examination.  If the procedure report does not answer your questions, please call your gastroenterologist to clarify.  If you requested that your care partner not be given the details of your procedure findings, then the procedure report has been included in a sealed envelope for you to review at your convenience later.  YOU SHOULD EXPECT: Some feelings of bloating in the abdomen. Passage of more gas than usual.  Walking can help get rid of the air that was put into your GI tract during the procedure and reduce the bloating. If you had a lower endoscopy (such as a colonoscopy or flexible sigmoidoscopy) you may notice spotting of blood in your stool or on the toilet paper. If you underwent a bowel prep for your procedure, you may not have a normal bowel movement for a few days.  Please Note:  You might notice some irritation and congestion in your nose or some drainage.  This is from the oxygen used during your procedure.  There is no need for concern and it should clear up in a day or so.  SYMPTOMS TO REPORT IMMEDIATELY:   Following lower endoscopy (colonoscopy or flexible sigmoidoscopy):  Excessive amounts of blood in the stool  Significant tenderness or worsening of abdominal pains  Swelling of the abdomen that is new, acute  Fever of 100F or higher   For urgent or emergent issues, a gastroenterologist can be reached at any hour by calling [phone].   DIET:  We do recommend a small meal at first, but then you may proceed to your regular diet.  Drink plenty of fluids but you should avoid alcoholic beverages for 24 hours.  ACTIVITY:  You should plan to take it easy for the rest of today and you should NOT DRIVE  or use heavy machinery until tomorrow (because of the sedation medicines used during the test).    FOLLOW UP: Our staff will call the number listed on your records the next business day following your procedure to check on you and address any questions or concerns that you may have regarding the information given to you following your procedure. If we do not reach you, we will leave a message.  However, if you are feeling well and you are not experiencing any problems, there is no need to return our call.  We will assume that you have returned to your regular daily activities without incident.  If any biopsies were taken you will be contacted by phone or by letter within the next 1-3 weeks.  Please call us at [phone] if you have not heard about the biopsies in 3 weeks.    SIGNATURES/CONFIDENTIALITY: You and/or your care partner have signed paperwork which will be entered into your electronic medical record.  These signatures attest to the fact that that the information above on your After Visit Summary has been reviewed and is understood.  Full responsibility of the confidentiality of this discharge information lies with you and/or your care-partner. 

## 2018-02-18 NOTE — Progress Notes (Signed)
Report to PACU, RN, vss, BBS= Clear.  

## 2018-02-18 NOTE — Progress Notes (Signed)
Called to room to assist during endoscopic procedure.  Patient ID and intended procedure confirmed with present staff. Received instructions for my participation in the procedure from the performing physician.  

## 2018-02-19 ENCOUNTER — Telehealth: Payer: Self-pay

## 2018-02-19 NOTE — Telephone Encounter (Signed)
  Follow up Call-  Call back number 02/18/2018  Post procedure Call Back phone  # [phone]  Permission to leave phone message Yes  Some recent data might be hidden     Patient questions:  Do you have a fever, pain , or abdominal swelling? No. Pain Score  0 *  Have you tolerated food without any problems? Yes.    Have you been able to return to your normal activities? Yes.    Do you have any questions about your discharge instructions: Diet   No. Medications  No. Follow up visit  No.  Do you have questions or concerns about your Care? No.  Actions: * If pain score is 4 or above: No action needed, pain <4.

## 2018-02-22 ENCOUNTER — Encounter: Payer: Self-pay | Admitting: Internal Medicine

## 2018-03-12 NOTE — Research (Signed)
Medication compliance: Month-Yr # pills dispensed Doses per mo.  Dec-18 75 Early dispensing  Apr-19 90 2  May-19  31  Jun-19  30  Jul-19  31  Aug-19  31  Sep-19  30  Oct-19  6   165 161      4 Expected return  11 Actual return   Compliance rate = 95.65%  Adverse Event Log  [facility] [id number] Month 30 07/06/2017 - 12/14/2017 Baseline AEs (grade): dizziness, int. (1); anxiety (2); joint pain, int. (2); seasonal allergies (1) Event Grade Attribution to aspirin/placebo Comments  Bruising 1 Probable   Weight gain 2 Possible   Hypertension 2 Unrelated   Hemorrhoidal hemorrhage 1 Unlikely   Left arm pain and swelling 2 Unlikely   Left axillary pain and swelling 2 Unlikely   Left chest pain and swelling 2 Unlikely   Right arm & neck swelling 2 Unlikely   Generalized muscle weakness 2 Unlikely   Osteoporosis 2 Unlikely   Insect bite & bee sting 2 Unlikely Left finger and Left wrist  Pustule, index finger distal phalanx 2 Unlikely Rx: Keflex  Non-reportable grade 1 events: - Hypernatremia - Hyperglycemia - Hypocalcemia [name] BSN, RN, CCRP 03/15/2018 4:36 PM

## 2018-03-20 ENCOUNTER — Other Ambulatory Visit: Payer: Self-pay | Admitting: Oncology

## 2018-03-20 DIAGNOSIS — C50412 Malignant neoplasm of upper-outer quadrant of left female breast: Secondary | ICD-10-CM

## 2018-04-15 ENCOUNTER — Encounter: Payer: Self-pay | Admitting: Family Medicine

## 2018-04-15 ENCOUNTER — Ambulatory Visit (INDEPENDENT_AMBULATORY_CARE_PROVIDER_SITE_OTHER): Payer: PRIVATE HEALTH INSURANCE | Admitting: Family Medicine

## 2018-04-15 VITALS — BP 127/80 | HR 66 | Temp 98.2°F | Ht 65.0 in | Wt 169.0 lb

## 2018-04-15 DIAGNOSIS — E7800 Pure hypercholesterolemia, unspecified: Secondary | ICD-10-CM | POA: Diagnosis not present

## 2018-04-15 DIAGNOSIS — H8109 Meniere's disease, unspecified ear: Secondary | ICD-10-CM

## 2018-04-15 DIAGNOSIS — F418 Other specified anxiety disorders: Secondary | ICD-10-CM

## 2018-04-15 DIAGNOSIS — Z170 Estrogen receptor positive status [ER+]: Secondary | ICD-10-CM

## 2018-04-15 DIAGNOSIS — I700 Atherosclerosis of aorta: Secondary | ICD-10-CM | POA: Diagnosis not present

## 2018-04-15 DIAGNOSIS — C50412 Malignant neoplasm of upper-outer quadrant of left female breast: Secondary | ICD-10-CM

## 2018-04-15 DIAGNOSIS — E559 Vitamin D deficiency, unspecified: Secondary | ICD-10-CM | POA: Diagnosis not present

## 2018-04-15 MED ORDER — ALENDRONATE SODIUM 70 MG PO TABS: 70.0000 mg | ORAL_TABLET | ORAL | 3 refills | Status: DC

## 2018-04-15 MED ORDER — LORAZEPAM 1 MG PO TABS: 1.0000 mg | ORAL_TABLET | Freq: Every day | ORAL | 5 refills | Status: DC

## 2018-04-15 MED ORDER — PAROXETINE HCL ER 25 MG PO TB24: 25.0000 mg | ORAL_TABLET | Freq: Every day | ORAL | 3 refills | Status: DC

## 2018-04-15 MED ORDER — DONEPEZIL HCL 10 MG PO TABS: 10.0000 mg | ORAL_TABLET | Freq: Every day | ORAL | 3 refills | Status: DC

## 2018-04-15 MED ORDER — VITAMIN D (ERGOCALCIFEROL) 1.25 MG (50000 UNIT) PO CAPS: ORAL_CAPSULE | ORAL | 3 refills | Status: DC

## 2018-04-15 MED ORDER — CLONAZEPAM 0.5 MG PO TABS: 0.5000 mg | ORAL_TABLET | Freq: Every day | ORAL | 5 refills | Status: DC

## 2018-04-15 MED ORDER — ROSUVASTATIN CALCIUM 10 MG PO TABS: 10.0000 mg | ORAL_TABLET | Freq: Every day | ORAL | 3 refills | Status: DC

## 2018-04-15 NOTE — Patient Instructions (Addendum)
Continue current medications. Continue good therapeutic lifestyle changes which include good diet and exercise. Fall precautions discussed with patient. If an FOBT was given today- please return it to our front desk. If you are over 56 years old - you may need Prevnar 76 or the adult Pneumonia vaccine.  **Flu shots are available--- please call and schedule a FLU-CLINIC appointment**  After your visit with [place] today you will receive a survey in the mail or online from [employer] regarding your care with [place]. Please take a moment to fill this out. Your feedback is very important to [place] as you can help [place] better understand your patient needs as well as improve your experience and satisfaction. WE CARE ABOUT YOU!!!   Continue to work on weight loss through diet and exercise Continue to take Crestor Drink plenty of fluids and especially water and stay as active as possible Follow-up with oncologist Follow-up with gastroenterologist as planned

## 2018-04-15 NOTE — Progress Notes (Signed)
Subjective:    Patient ID: [name], female    DOB: 1961-03-03, 58 y.o.   MRN: [id number]  HPI  Pt here for follow up and management of chronic medical problems which includes hyperlipidemia. She is taking medication regularly.  The patient is doing well overall.  There is a strong family history of heart disease in her family.  She is tolerating Crestor with good results with this as of the last visit.  She is followed regularly by her oncologist for her breast cancer.  Her vital signs are stable her weight is up about 6 points and her BMI is slightly elevated more than previously.  She will get lab work today and we will refill all of her medicines.  She does have a history of anxiety disorder.  Her last colonoscopy was in December of this past year.  She gets her mammograms regularly.  Colonoscopy had 4 polyps being found in the sigmoid and transverse colon and cecum.  These were tubular adenomas with no high-grade dysplasia being noted.  The patient today says that she got off of her diet and that is why the weight gain has occurred but she is trying to rededicate herself to her diet and do better and I know that she can do this.  She denies any chest pain pressure tightness or shortness of breath.  She denies any trouble with swallowing heartburn indigestion nausea vomiting diarrhea blood in the stool or black tarry bowel movements.  She does mix some Metamucil in her juice every day to help with her bowel movements.  Her colonoscopy was done in December by Dr. Henrene Pastor and he indicated he might would want to do another one in 3 to 5 years because of the number of polyps that were found and he will get back in touch with her on that.  She sees the gynecologist yearly and has a urinalysis done with a gynecologist.  She sees the eye doctor yearly also.  She also sees dermatology regularly.   Patient Active Problem List   Diagnosis Date Noted  . Aortic atherosclerosis ([place]) 04/16/2017  . Nodule  of upper lobe of right lung 04/16/2017  . Anxiety with depression 10/08/2016  . Insomnia 09/22/2016  . Bronchitis 05/26/2016  . Vitamin D deficiency 04/07/2016  . Easy bruising 03/20/2016  . 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 Anemia associated with chemotherapy 10/24/2014  . Malignant neoplasm of upper-outer quadrant of left breast in female, estrogen receptor positive ([place]) 07/31/2014  . Hyperlipidemia 09/06/2012  . Generalized anxiety disorder 09/06/2012  . Meniere's disease 09/06/2012  . Statin intolerance 09/06/2012  . Left rotator cuff tear 09/06/2012   Outpatient Encounter Medications as of 04/15/2018  Medication Sig  . acetaminophen (TYLENOL) 325 MG tablet Take 975 mg by mouth daily as needed for moderate pain. Usually takes twice daily in morning and bedtime and additionally during the day if needed for pain  . alendronate (FOSAMAX) 70 MG tablet Take 1 tablet (70 mg total) by mouth once a week. Take with a full glass of water on an empty stomach.  Marland Kitchen anastrozole (ARIMIDEX) 1 MG tablet TAKE 1 TABLET BY MOUTH EVERY DAY  . Biotin 1000 MCG tablet Take 1,000 mcg by mouth daily.  Marland Kitchen CALCIUM PO Take 1 tablet by mouth daily.  . clonazePAM (KLONOPIN) 0.5 MG tablet Take 1 tablet (0.5 mg total) by mouth 3 (three) times daily as needed. (Patient taking differently: Take 0.5 mg by mouth daily. )  . donepezil (  ARICEPT) 10 MG tablet Take 1 tablet (10 mg total) by mouth at bedtime.  . Investigational aspirin/placebo 100 MG tablet [facility] [id number] Take 1 tablet by mouth daily. Take with food or a full glass of water. Do not crush enteric coated tablets.  Marland Kitchen LORazepam (ATIVAN) 1 MG tablet TAKE 1 TABLET BY MOUTH AT BEDTIME  . PARoxetine (PAXIL-CR) 25 MG 24 hr tablet Take 1 tablet (25 mg total) by mouth daily.  . rosuvastatin (CRESTOR) 10 MG tablet Take 1 tablet (10 mg total) by mouth daily.  . traMADol (ULTRAM) 50 MG tablet Take 1 tablet (50 mg total) by mouth every 6 (six) hours as needed.  . Turmeric 500  MG TABS Take 1 tablet by mouth daily.  . Vitamin D, Ergocalciferol, (DRISDOL) 50000 units CAPS capsule TAKE 1 CAPSULE (50,000 UNITS TOTAL) BY MOUTH EVERY 7 (SEVEN) DAYS.   No facility-administered encounter medications on file as of 04/15/2018.      Review of Systems  Constitutional: Negative.   HENT: Negative.   Eyes: Negative.   Respiratory: Negative.   Cardiovascular: Negative.   Gastrointestinal: Negative.   Endocrine: Negative.   Genitourinary: Negative.   Musculoskeletal: Negative.   Skin: Negative.   Allergic/Immunologic: Negative.   Neurological: Negative.   Hematological: Negative.   Psychiatric/Behavioral: Negative.        Objective:   Physical Exam Vitals signs and nursing note reviewed.  Constitutional:      Appearance: Normal appearance. She is well-developed. She is not ill-appearing.     Comments: Patient is pleasant smiling and alert and in good spirits this morning.  HENT:     Head: Normocephalic and atraumatic.     Right Ear: Tympanic membrane, ear canal and external ear normal. There is no impacted cerumen.     Left Ear: Tympanic membrane, ear canal and external ear normal. There is no impacted cerumen.     Nose: Nose normal.     Mouth/Throat:     Mouth: Mucous membranes are moist.     Pharynx: Oropharynx is clear. No oropharyngeal exudate or posterior oropharyngeal erythema.  Eyes:     General: No scleral icterus.       Right eye: No discharge.        Left eye: No discharge.     Extraocular Movements: Extraocular movements intact.     Conjunctiva/sclera: Conjunctivae normal.     Pupils: Pupils are equal, round, and reactive to light.  Neck:     Musculoskeletal: Normal range of motion and neck supple.     Thyroid: No thyromegaly.     Vascular: No carotid bruit or JVD.     Comments: No bruits thyromegaly or anterior cervical adenopathy Cardiovascular:     Rate and Rhythm: Normal rate and regular rhythm.     Pulses: Normal pulses.     Heart sounds:  Normal heart sounds. No murmur. No gallop.      Comments: The heart is regular at 60/min with good pedal pulses and no edema Pulmonary:     Effort: Pulmonary effort is normal.     Breath sounds: Normal breath sounds. No wheezing or rales.     Comments: Clear anteriorly and posteriorly Abdominal:     General: Abdomen is flat. Bowel sounds are normal.     Palpations: Abdomen is soft. There is no mass.     Tenderness: There is no abdominal tenderness.  Musculoskeletal: Normal range of motion.        General: No tenderness.  Right lower leg: No edema.     Left lower leg: No edema.  Lymphadenopathy:     Cervical: No cervical adenopathy.  Skin:    General: Skin is warm and dry.     Findings: No erythema or rash.  Neurological:     General: No focal deficit present.     Mental Status: She is alert and oriented to person, place, and time. Mental status is at baseline.     Cranial Nerves: No cranial nerve deficit.     Gait: Gait normal.     Deep Tendon Reflexes: Reflexes are normal and symmetric. Reflexes normal.  Psychiatric:        Mood and Affect: Mood normal.        Behavior: Behavior normal.        Thought Content: Thought content normal.        Judgment: Judgment normal.     Comments: Mood affect and behavior are normal for this patient.     BP 127/80 (BP Location: Left Arm)   Pulse 66   Temp 98.2 F (36.8 C) (Oral)   Ht _0  (1.651 m)   Wt 169 lb (76.7 kg)   BMI 28.12 kg/m        Assessment & Plan:  1. Pure hypercholesterolemia -Continue with as aggressive therapeutic lifestyle changes as possible and current dose of Crestor pending results of lab work - BMP8+EGFR - CBC with Differential/Platelet - Lipid panel - Hepatic function panel  2. Vitamin D deficiency -Continue with vitamin D replacement pending results of lab work - CBC with Differential/Platelet - VITAMIN D 25 Hydroxy (Vit-D Deficiency, Fractures)  3. Meniere's disease, unspecified  laterality -No complaints with this today. - CBC with Differential/Platelet  4. Aortic atherosclerosis ([facility]) -Continue with statin therapy and as aggressive therapeutic lifestyle changes as possible - CBC with Differential/Platelet - Lipid panel - Hepatic function panel  5. Malignant neoplasm of upper-outer quadrant of left breast in female, estrogen receptor positive ([place]) -Follow-up with oncologist as planned  6. Anxiety with depression -Continue with lorazepam as needed and avoid caffeine as much as possible  Meds ordered this encounter  Medications  . alendronate (FOSAMAX) 70 MG tablet    Sig: Take 1 tablet (70 mg total) by mouth once a week. Take with a full glass of water on an empty stomach.    Dispense:  12 tablet    Refill:  3  . donepezil (ARICEPT) 10 MG tablet    Sig: Take 1 tablet (10 mg total) by mouth at bedtime.    Dispense:  90 tablet    Refill:  3  . PARoxetine (PAXIL-CR) 25 MG 24 hr tablet    Sig: Take 1 tablet (25 mg total) by mouth daily.    Dispense:  90 tablet    Refill:  3  . rosuvastatin (CRESTOR) 10 MG tablet    Sig: Take 1 tablet (10 mg total) by mouth daily.    Dispense:  90 tablet    Refill:  3  . Vitamin D, Ergocalciferol, (DRISDOL) 1.25 MG (50000 UT) CAPS capsule    Sig: TAKE 1 CAPSULE (50,000 UNITS TOTAL) BY MOUTH EVERY 7 (SEVEN) DAYS.    Dispense:  12 capsule    Refill:  3  . LORazepam (ATIVAN) 1 MG tablet    Sig: Take 1 tablet (1 mg total) by mouth at bedtime.    Dispense:  30 tablet    Refill:  5    Not to exceed 5  additional fills before 03/21/2017.  . clonazePAM (KLONOPIN) 0.5 MG tablet    Sig: Take 1 tablet (0.5 mg total) by mouth daily.    Dispense:  30 tablet    Refill:  5    This request is for a new prescription for a controlled substance as required by Federal/State law..   Patient Instructions  Continue current medications. Continue good therapeutic lifestyle changes which include good diet and exercise. Fall precautions  discussed with patient. If an FOBT was given today- please return it to our front desk. If you are over 12 years old - you may need Prevnar 63 or the adult Pneumonia vaccine.  **Flu shots are available--- please call and schedule a FLU-CLINIC appointment**  After your visit with [place] today you will receive a survey in the mail or online from [employer] regarding your care with [place]. Please take a moment to fill this out. Your feedback is very important to [place] as you can help [place] better understand your patient needs as well as improve your experience and satisfaction. WE CARE ABOUT YOU!!!   Continue to work on weight loss through diet and exercise Continue to take Crestor Drink plenty of fluids and especially water and stay as active as possible Follow-up with oncologist Follow-up with gastroenterologist as planned  Arrie Senate MD

## 2018-04-15 NOTE — Addendum Note (Signed)
Addended by: Zannie Cove on: 04/15/2018 08:18 AM   Modules accepted: Orders

## 2018-04-16 LAB — CBC WITH DIFFERENTIAL/PLATELET
Basophils Absolute: 0 10*3/uL (ref 0.0–0.2)
Basos: 1 %
EOS (ABSOLUTE): 0.1 10*3/uL (ref 0.0–0.4)
EOS: 1 %
Hematocrit: 39.4 % (ref 34.0–46.6)
Hemoglobin: 13.5 g/dL (ref 11.1–15.9)
IMMATURE GRANS (ABS): 0 10*3/uL (ref 0.0–0.1)
Immature Granulocytes: 0 %
Lymphocytes Absolute: 2 10*3/uL (ref 0.7–3.1)
Lymphs: 39 %
MCH: 32.2 pg (ref 26.6–33.0)
MCHC: 34.3 g/dL (ref 31.5–35.7)
MCV: 94 fL (ref 79–97)
Monocytes Absolute: 0.4 10*3/uL (ref 0.1–0.9)
Monocytes: 8 %
Neutrophils Absolute: 2.6 10*3/uL (ref 1.4–7.0)
Neutrophils: 51 %
Platelets: 202 10*3/uL (ref 150–450)
RBC: 4.19 x10E6/uL (ref 3.77–5.28)
RDW: 12.6 % (ref 11.7–15.4)
WBC: 5.1 10*3/uL (ref 3.4–10.8)

## 2018-04-16 LAB — HEPATIC FUNCTION PANEL
ALT: 21 IU/L (ref 0–32)
AST: 19 IU/L (ref 0–40)
Albumin: 4.4 g/dL (ref 3.8–4.9)
Alkaline Phosphatase: 62 IU/L (ref 39–117)
Bilirubin Total: 0.4 mg/dL (ref 0.0–1.2)
Bilirubin, Direct: 0.14 mg/dL (ref 0.00–0.40)
Total Protein: 6.6 g/dL (ref 6.0–8.5)

## 2018-04-16 LAB — BMP8+EGFR
BUN/Creatinine Ratio: 15 (ref 9–23)
BUN: 9 mg/dL (ref 6–24)
CO2: 23 mmol/L (ref 20–29)
CREATININE: 0.6 mg/dL (ref 0.57–1.00)
Calcium: 9.4 mg/dL (ref 8.7–10.2)
Chloride: 101 mmol/L (ref 96–106)
GFR calc Af Amer: 118 mL/min/{1.73_m2} (ref >59)
GFR calc non Af Amer: 102 mL/min/{1.73_m2} (ref >59)
Glucose: 102 mg/dL — ABNORMAL HIGH (ref 65–99)
Potassium: 4 mmol/L (ref 3.5–5.2)
Sodium: 140 mmol/L (ref 134–144)

## 2018-04-16 LAB — LIPID PANEL
Chol/HDL Ratio: 2.6 ratio (ref 0.0–4.4)
Cholesterol, Total: 135 mg/dL (ref 100–199)
HDL: 51 mg/dL (ref >39)
LDL Calculated: 54 mg/dL (ref 0–99)
Triglycerides: 148 mg/dL (ref 0–149)
VLDL Cholesterol Cal: 30 mg/dL (ref 5–40)

## 2018-04-16 LAB — VITAMIN D 25 HYDROXY (VIT D DEFICIENCY, FRACTURES): Vit D, 25-Hydroxy: 67.1 ng/mL (ref 30.0–100.0)

## 2018-06-11 ENCOUNTER — Ambulatory Visit: Payer: PRIVATE HEALTH INSURANCE | Admitting: Hematology and Oncology

## 2018-06-11 ENCOUNTER — Other Ambulatory Visit: Payer: Self-pay | Admitting: Hematology and Oncology

## 2018-06-11 MED ORDER — AMOXICILLIN 500 MG PO CAPS: 500.0000 mg | ORAL_CAPSULE | Freq: Two times a day (BID) | ORAL | 0 refills | Status: DC

## 2018-06-25 ENCOUNTER — Encounter: Payer: Self-pay | Admitting: *Deleted

## 2018-06-25 ENCOUNTER — Telehealth: Payer: Self-pay | Admitting: Hematology and Oncology

## 2018-06-25 DIAGNOSIS — C50412 Malignant neoplasm of upper-outer quadrant of left female breast: Secondary | ICD-10-CM

## 2018-06-25 DIAGNOSIS — Z170 Estrogen receptor positive status [ER+]: Principal | ICD-10-CM

## 2018-06-25 NOTE — Progress Notes (Addendum)
HEMATOLOGY-ONCOLOGY WebEx VISIT PROGRESS NOTE  I connected with [name] on 06/28/2018 at  8:15 AM EDT by WebEx and verified the patient I discussed the limitations, risks, security and privacy concerns of performing an evaluation and management service by WebEx and the availability of in person appointments.  I also discussed with the patient that there may be a patient responsible charge related to this service. The patient expressed understanding and agreed to proceed.   History of Present Illness: [name] is a 58 y.o. female with above-mentioned history of left breast cancer treated with lumpectomy, adjuvant chemotherapy, and radiation who is currently on oral antiestrogen therapy with anastrozole. She is a participant in the [place] and ABC clinical trials.  She is tolerating anastrozole extremely well.  She has mild bruising related to ABC clinical trial.  Occasional episodes of hemorrhoidal bleeding.  No other serious bleeding problems.  She has been very compliant with her medications.  Denies any lumps or nodules in the breast.  She completed a course of amoxicillin for upper respiratory infection and feels much better.    Malignant neoplasm of upper-outer quadrant of left breast in female, estrogen receptor positive ([place])   07/18/2014 Mammogram    Distortion left breast, breast density category C; U/S 1.3 x 0.8 x 0.8 cm left breast mass at 2:00 position 4 cm from nipple, no lymph nodes    07/20/2014 Initial Diagnosis    Left breast Biopsy: Invasive lobular cancer with LCIS, Grade 1, ER 86%, PR 78%, Her 2 Neg Ratio 1.77, Ki 67: 14%    08/01/2014 Breast MRI    Breast MRI showed non-mass enhancement 3.9 cm, no lymph nodes    08/01/2014 Clinical Stage    Stage IIA: T2 N0    08/08/2014 Surgery    Left breast lumpectomy: 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, with LCIS, medial and inferior margins positive, 3/4 lymph nodes positive    08/08/2014 Pathologic Stage    Stage  IIB: T2 N1c M0    08/16/2014 Surgery    Left breast medial margin reexcision residual ILC 0.2 cm; inferior margin residual foci less than 0.2 cm, 1/5 lymph nodes positive, 1 lymph node with isolated tumor cells (Overall 4/10)    09/04/2014 - 01/15/2015 Chemotherapy    Adjuvant chemotherapy with dose dense Adriamycin and Cytoxan 4 followed by Abraxane weekly 8 ( stopped early for profound neutropenia and thrombocytopenia)    01/29/2015 - 03/20/2015 Radiation Therapy    Adjuvant XRT [facility]): 50.4 Gy over 28 fractions; seroma boost: 10 Gy over 5 fractions. Total dose: 60.4 Gy    04/11/2015 -  Anti-estrogen oral therapy    Anastrozole 1 mg daily (PALLAS clinical trial 05/24/2015 patient was randomized to hormone therapy alone)    05/24/2015 Survivorship    Survivorship visit completed and copy of care plan given to patient    11/26/2016 PET scan    Small Left sided level 2 LN with hypermetabolism, post surg changes in breast and small right groin lymph node likely infectious/inflammatory, gallstones     Observations/Objective:  No apparent signs or symptoms of recurrence of breast cancer. ECOG performance status: 0   Assessment Plan:  Malignant neoplasm of upper-outer quadrant of left breast in female, estrogen receptor positive ([place]) Left breast lumpectomy 08/08/2014: 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, with LCIS, medial and inferior margins positive, 3/4 lymph nodes positive T2 N1 cM0 stage IIB, ER 86%, PR 78%, HER-2/neu negative ratio 1.77, Ki-67 14% Left breast  medial margin reexcision 08/18/14: residual ILC 0.2 cm; inferior margin residual foci less than 0.2 cm, 1/5 lymph nodes positive, 1 lymph node with isolated tumor cells (Overall 4/10)  Treatment Summary: 1. Adjuvant chemotherapy with dose dense Adriamycin and Cytoxan x 4 followed by Abraxane weekly 12 because of lymph node positive disease. Started 09/04/14 to 01/15/15 2. adjuvant radiation completed 03/20/15 3. Followed by  antiestrogen therapy started 04/03/15 -------------------------------------------------------------------------------------------------------------------------- Anastrozole toxicities: Patient denies any hot flashes. Weight gain   Bone density 07/26/2015: T score -1.9, osteopenia  PALLAS Trial : Patient has been randomized to anastrozole alone. ABC clinical trial: The trial randomizes between aspirin versus placebo. She went on trial 07/26/2015 Currently on 100 mg of dosage. Bruising has become markedly reduced.  Adverse effects: 1. Bruising/ Hematoma: Resolved after dose reduction. She is currently going to be on 100 mg of aspirin/placebo 2. bleeding from mucous membranes especially GI, vaginal, nose, gums, nailbeds: Resolved  Cognitive dysfunction: OnAricept 10 mg daily. Memory function has improvedsignificantly  Weight gain:On Weight Watchers, now doing Keto diet  Breast cancer surveillance: 1. Mammogram done [facility]/2017 [facility] showed no evidence of malignancy breast density category C 2. Breast exam 12/14/2017: No palpable lumps or nodules of concern. 3.  CT CAP 10/28/2017: No evidence of malignancy, radiation changes anterior left upper lobe   Return to clinic in 6 months  I discussed the assessment and treatment plan with the patient. The patient was provided an opportunity to ask questions and all were answered. The patient agreed with the plan and demonstrated an understanding of the instructions. The patient was advised to call back or seek an in-person evaluation if the symptoms worsen or if the condition fails to improve as anticipated.   I provided 15 minutes of face-to-face time during this encounter.   Rulon Eisenmenger, MD 06/28/2018     I, Molly Dorshimer, am acting as scribe for Nicholas Lose, MD.  I have reviewed the above documentation for accuracy and completeness, and I agree with the above.

## 2018-06-25 NOTE — Research (Signed)
06/25/2018 Spoke with patient by phone today regarding the following visits.  AFT-05 PALLAS - Month 36 follow-up visit Patient due for AFT-05 PALLAS study 36 month follow-up Phase 1 visit. Due to restrictions and recommendations related to the COVID-19 pandemic, patient's follow-up visit has been changed to a remote visit to be conducted by Dr. Lindi Adie via WebEx or telephone on Monday, April 20th. Research visit conducted via telephone with patient today. Patient continues to take anastrozole; she has not taken any other anti-cancer medications. Patient has had no SAEs since the EOT visit. See MD note for disease monitoring.  PRO & Adherence Questionnaires: Will be mailed to patient for completion, along with postage-paid, pre-addressed envelopes for return.   Alliance [id number] ABC - Month 36 visit Patient due for Alliance [id number] ABC study 36 month follow-up visit. Due to restrictions and recommendations related to the COVID-19 pandemic, patient's follow-up visit has been changed to a remote visit to be conducted by Dr. Lindi Adie via WebEx or telephone on Monday, April 20th. Research visit conducted via telephone with patient today.  Adverse events and NSAID use: Solicited AEs were reviewed with the patient. She reports generally mild heartburn with occasional episodes requiring treatment approximately every 3 months (grade 2). Patient reports ongoing mild bruising (grade 1). She has had occasional episodes of hemorrhoidal bleeding. She denies any nosebleeds, blood in her urine, or symptoms of heartburn or gastritis. There has been no documentation of other gastrointestinal bleeding or intracranial hemorrhage. She has experienced no cardiovascular events or serious adverse events.  She denies taking any aspirin or medications containing NSAIDs, and states that she takes only Tylenol as needed for pain.  Study medication: Patient states she has missed no doses of study medication. Per calculation of supply  of #200 initiated on 01/08/2018, patient has enough drug to last until May 18th. Plans made to follow up with patient during the first week in May to determine best plan for resupply, either in person or via mail. Patient states an interest in returning to the cancer center for labwork as ordered by Dr. Lindi Adie, and may perform medication exchange at that time, or via drive-by, if necessary. Requested that patient return all previous medication bottles from prior visits, if the exchange is done in person.  Medication Logs: Patient currently has copies of medication logs through April 2020. A copy of the May medication log, plus postage-paid, pre-addressed envelopes will be mailed to the patient for return of October 2019 - March 2020 medication logs. She may continue to use April & May 2020 logs to record doses until medication exchange is performed.  [name] BSN, RN, [place] 06/25/2018 3:30 PM

## 2018-06-25 NOTE — Telephone Encounter (Signed)
Called patient regarding upcoming Webex meeting, patient is having difficulties with the audio and would prefer this to be a telephone visit.

## 2018-06-28 ENCOUNTER — Inpatient Hospital Stay: Payer: PRIVATE HEALTH INSURANCE | Admitting: *Deleted

## 2018-06-28 ENCOUNTER — Other Ambulatory Visit: Payer: Self-pay | Admitting: Hematology and Oncology

## 2018-06-28 ENCOUNTER — Inpatient Hospital Stay: Payer: PRIVATE HEALTH INSURANCE | Attending: Hematology and Oncology | Admitting: Hematology and Oncology

## 2018-06-28 DIAGNOSIS — Z170 Estrogen receptor positive status [ER+]: Secondary | ICD-10-CM | POA: Diagnosis not present

## 2018-06-28 DIAGNOSIS — Z006 Encounter for examination for normal comparison and control in clinical research program: Secondary | ICD-10-CM

## 2018-06-28 DIAGNOSIS — C50412 Malignant neoplasm of upper-outer quadrant of left female breast: Secondary | ICD-10-CM

## 2018-06-28 DIAGNOSIS — Z9221 Personal history of antineoplastic chemotherapy: Secondary | ICD-10-CM

## 2018-06-28 DIAGNOSIS — Z923 Personal history of irradiation: Secondary | ICD-10-CM

## 2018-06-28 DIAGNOSIS — Z79811 Long term (current) use of aromatase inhibitors: Secondary | ICD-10-CM

## 2018-06-28 MED ORDER — ANASTROZOLE 1 MG PO TABS: 1.0000 mg | ORAL_TABLET | Freq: Every day | ORAL | 3 refills | Status: DC

## 2018-06-28 NOTE — Assessment & Plan Note (Signed)
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 Adjuvant chemotherapy with dose dense Adriamycin and Cytoxan x 4 followed by Abraxane weekly 12 because of lymph node positive disease. Started 09/04/14 to 01/15/15 2. adjuvant radiation completed 03/20/15 3. Followed by antiestrogen therapy started 04/03/15 -------------------------------------------------------------------------------------------------------------------------- Anastrozole toxicities: Patient denies any hot flashes. Weight gain Arthralgias and myalgias  Bone density 07/26/2015: T score -1.9, osteopenia  PALLAS Trial : Patient has been randomized to anastrozole alone. ABC clinical trial: The trial randomizes between aspirin versus placebo. She went on trial 07/26/2015 Currently on 100 mg of dosage. Bruising has become markedly reduced.  Adverse effects: 1. Bruising/ Hematoma: Resolved after dose reduction. She is currently going to be on 100 mg of aspirin/placebo 2. bleeding from mucous membranes especially GI, vaginal, nose, gums, nailbeds: Resolved  Cognitive dysfunction: OnAricept 10 mg daily. Memory function has improvedsignificantly  Weight gain:On Weight Watchers, now doing Keto diet  Breast cancer surveillance: 1. Mammogram done 07/22/2017 at [facility] showed no evidence of malignancy breast density category C 2. Breast exam 12/14/2017: No palpable lumps or nodules of concern. 3.  CT CAP 10/28/2017: No evidence of malignancy, radiation changes anterior left upper lobe   Return to clinic in 6 months

## 2018-06-29 ENCOUNTER — Telehealth: Payer: Self-pay | Admitting: Hematology and Oncology

## 2018-06-29 NOTE — Telephone Encounter (Signed)
Called regarding 10/20

## 2018-07-16 ENCOUNTER — Telehealth: Payer: Self-pay | Admitting: *Deleted

## 2018-07-16 NOTE — Telephone Encounter (Signed)
07/16/2018 Spoke with patient by phone today regarding plans for dispensing study medication (aspirin 100mg /placebo) in the coming week. Patient states that she will be in [place] next Tuesday for her gynecology annual follow up with Dr. Willis Modena at 10:00 and would like to come to the [place] for lab work and medication exchange prior. Request for lab appointment at 9:15 sent to scheduler, and secure message to Dr. Lindi Adie regarding lab orders.[name]. [name] BSN, RN, [place] 07/16/2018 2:51 PM

## 2018-07-19 ENCOUNTER — Other Ambulatory Visit: Payer: Self-pay

## 2018-07-19 ENCOUNTER — Encounter: Payer: Self-pay | Admitting: Hematology and Oncology

## 2018-07-19 ENCOUNTER — Encounter: Payer: Self-pay | Admitting: *Deleted

## 2018-07-19 DIAGNOSIS — C50412 Malignant neoplasm of upper-outer quadrant of left female breast: Secondary | ICD-10-CM

## 2018-07-19 DIAGNOSIS — Z170 Estrogen receptor positive status [ER+]: Secondary | ICD-10-CM

## 2018-07-19 NOTE — Research (Signed)
Adverse Event Log  Alliance [id number] Month 36 12/14/2017 - 06/28/2018 Baseline AEs (grade): dizziness, int. (1); anxiety (2); joint pain, int. (2); seasonal allergies (1) Event Grade Attribution to aspirin/placebo Comments  Bruising 1 Probable    Weight gain 2 Possible    Hypertension 1 Unrelated    Hemorrhoidal hemorrhage 1 Unlikely    Dyspepsia 2 Unlikely Rare occurrences about once every 3 months  Mucoid cyst, left middle finger 2 Unrelated Surgical procedure November 2019  Non-reportable grade 1 events: - Hyperglycemia  [name] BSN, RN, CCRP 07/19/2018 4:53 PM

## 2018-07-20 ENCOUNTER — Inpatient Hospital Stay: Payer: PRIVATE HEALTH INSURANCE

## 2018-07-20 ENCOUNTER — Inpatient Hospital Stay: Payer: PRIVATE HEALTH INSURANCE | Attending: Hematology and Oncology | Admitting: *Deleted

## 2018-07-20 ENCOUNTER — Other Ambulatory Visit: Payer: Self-pay

## 2018-07-20 DIAGNOSIS — C50412 Malignant neoplasm of upper-outer quadrant of left female breast: Secondary | ICD-10-CM | POA: Diagnosis not present

## 2018-07-20 DIAGNOSIS — Z170 Estrogen receptor positive status [ER+]: Secondary | ICD-10-CM

## 2018-07-20 LAB — CBC WITH DIFFERENTIAL (CANCER CENTER ONLY)
Abs Immature Granulocytes: 0.01 10*3/uL (ref 0.00–0.07)
Basophils Absolute: 0 10*3/uL (ref 0.0–0.1)
Basophils Relative: 1 %
Eosinophils Absolute: 0 10*3/uL (ref 0.0–0.5)
Eosinophils Relative: 1 %
HCT: 41.6 % (ref 36.0–46.0)
Hemoglobin: 13.9 g/dL (ref 12.0–15.0)
Immature Granulocytes: 0 %
Lymphocytes Relative: 36 %
Lymphs Abs: 2.1 10*3/uL (ref 0.7–4.0)
MCH: 32.3 pg (ref 26.0–34.0)
MCHC: 33.4 g/dL (ref 30.0–36.0)
MCV: 96.5 fL (ref 80.0–100.0)
Monocytes Absolute: 0.5 10*3/uL (ref 0.1–1.0)
Monocytes Relative: 8 %
Neutro Abs: 3.2 10*3/uL (ref 1.7–7.7)
Neutrophils Relative %: 54 %
Platelet Count: 191 10*3/uL (ref 150–400)
RBC: 4.31 MIL/uL (ref 3.87–5.11)
RDW: 12.7 % (ref 11.5–15.5)
WBC Count: 5.7 10*3/uL (ref 4.0–10.5)
nRBC: 0 % (ref 0.0–0.2)

## 2018-07-20 LAB — CMP (CANCER CENTER ONLY)
ALT: 29 U/L (ref 0–44)
AST: 20 U/L (ref 15–41)
Albumin: 4.3 g/dL (ref 3.5–5.0)
Alkaline Phosphatase: 59 U/L (ref 38–126)
Anion gap: 11 (ref 5–15)
BUN: 10 mg/dL (ref 6–20)
CO2: 26 mmol/L (ref 22–32)
Calcium: 9.2 mg/dL (ref 8.9–10.3)
Chloride: 104 mmol/L (ref 98–111)
Creatinine: 0.71 mg/dL (ref 0.44–1.00)
GFR, Est AFR Am: >60 mL/min (ref >60)
GFR, Estimated: >60 mL/min (ref >60)
Glucose, Bld: 101 mg/dL — ABNORMAL HIGH (ref 70–99)
Potassium: 4.2 mmol/L (ref 3.5–5.1)
Sodium: 141 mmol/L (ref 135–145)
Total Bilirubin: 0.6 mg/dL (ref 0.3–1.2)
Total Protein: 7.2 g/dL (ref 6.5–8.1)

## 2018-07-20 MED ORDER — INV-ASPIRIN/PLACEBO 100 MG TABS ALLIANCE A011502: 1.0000 | ORAL_TABLET | Freq: Every day | ORAL | 0 refills | Status: DC

## 2018-07-20 NOTE — Research (Signed)
07/20/2018  [facility] [id number] ABC - Month 36 IP dispensing visit Patient in to clinic today for standard of care lab appointment, as she is on her way to her gynecologist for her routine annual follow-up. To limit exposure during the COVID-19 pandemic, met with patient briefly to provide new supply of study medication today.  Medication logs: Patient previously returned medication logs for October 2019 through April 2020 (partial month) via mail. Today patient returned medication logs with doses recorded for the remainder of April and for the month of May through yesterday's dosing. Patient was provided with new medication logs to begin recording doses today through the month of October 2020.  Investigational drug: One bottle of aspirin 119m/placebo (#200) was dispensed to the patient today per protocol. Patient returned the following: - Empty bottle (#180) from initial dispensing at Month 24 on 07/07/2018, re-dispensed to patient on 12/14/2017 with #11 tablets remaining, while awaiting resupply from distributor. - Empty bag (no bottle) from 12/16/2017 shipment to patient, expiring 01/07/2018. Will contact patient to see if she can locate bottle. - Bottle (#200) from 01/06/2018 shipment to patient to replace expiring drug, returned with #10 tablets remaining. Patient states that she has 3 tablets currently in her pill box at home for dosing this week. Empty and partial bottles were returned to pharmac[facility] proper drug accountability. Cindy S. SBrigitte PulseBSN, RN, CCRP 07/20/2018 1:42 PM

## 2018-07-23 ENCOUNTER — Other Ambulatory Visit: Payer: Self-pay | Admitting: Family Medicine

## 2018-07-23 ENCOUNTER — Other Ambulatory Visit: Payer: Self-pay | Admitting: *Deleted

## 2018-07-23 MED ORDER — CLONAZEPAM 0.5 MG PO TABS: 0.5000 mg | ORAL_TABLET | Freq: Three times a day (TID) | ORAL | 1 refills | Status: DC | PRN

## 2018-07-23 NOTE — Telephone Encounter (Signed)
Called pt - she says that the last clonazepam rx was written for QD and it has always been TID PRN - I looked back and she is right - I have set up the correct way for you to sign.

## 2018-07-23 NOTE — Telephone Encounter (Signed)
Pt is wanting to speak to Roselyn Reef B when i asked what she was needing to talk to her about she laughed and said dr Laurance Flatten

## 2018-07-26 ENCOUNTER — Telehealth: Payer: Self-pay | Admitting: Family Medicine

## 2018-07-26 MED ORDER — MECLIZINE HCL 25 MG PO TABS: 25.0000 mg | ORAL_TABLET | Freq: Three times a day (TID) | ORAL | 1 refills | Status: DC | PRN

## 2018-07-26 NOTE — Telephone Encounter (Signed)
PT states that she needs to talk to [facility] B about Dr Laurance Flatten. Didn't give anymore information

## 2018-07-26 NOTE — Telephone Encounter (Signed)
For review and discussion with patient.

## 2018-07-26 NOTE — Telephone Encounter (Signed)
Need refill on meclizine to have on hand for meniere's

## 2018-07-27 ENCOUNTER — Encounter: Payer: Self-pay | Admitting: Hematology and Oncology

## 2018-07-29 NOTE — Research (Signed)
07/29/2018 [facility] [id number] ABC - Month 36 IP accountability  #1 07/06/17 bottle re-dispensed - #11 tablets taken #2 12/16/2017 shipment to patient, bottle not returnrf, expected to have taken tablets from 12/25/2017 - 01/07/2018 (14 days) #3 01/06/2018 shipment to patient #13 of 200 tablets remaining = #187 taken  Total taken = at least 198 tablets Days taken = 217 days from 12/14/17 - 07/19/2018  Compliance rate = 91.2% at minimum  [name] BSN, RN, CCRP 07/29/2018 1:30 PM

## 2018-08-05 ENCOUNTER — Telehealth: Payer: Self-pay | Admitting: *Deleted

## 2018-08-05 NOTE — Telephone Encounter (Signed)
08/05/2018 Received voice mail message from patient stating that she did locate the bottle of medication shipped to her on 12/17/2018, as she had set it aside safely in her file cabinet so that she would not take any additional doses from this bottle which was past its expiration date. Patient offered to ship medication back or return it at her next visit, scheduled for October. Left return message for patient thanking her for her diligence in assisting with medication return. Explained that patient could return the medication at her next visit, but she did not need to ship it back at this time. Also gave patient the option of driving by to drop off the medication without entering the [facility], if [place]. Requested that patient schedule this with me in advance, if she chooses this option, due to intermittent remote work by [employer] and need to coordinate this with on-site staff covering, if necessary. [name] BSN, RN, [facility] 08/05/2018 9:37 AM

## 2018-08-09 MED ORDER — DENOSUMAB 120 MG/1.7ML ~~LOC~~ SOLN
SUBCUTANEOUS | Status: AC
  Filled 2018-08-09: qty 1.7

## 2018-08-12 NOTE — Research (Signed)
08/12/2018 Records obtained from 07/20/2018 gynecology office visit. Additional adverse event reported below:  Adverse Event Log  [facility] [id number] Month 36 12/14/2017 - 07/20/2018 Baseline AEs (grade): dizziness, int. (1); anxiety (2); joint pain, int. (2); seasonal allergies (1) Event Grade Attribution to aspirin/placebo Comments  bilateral labial hemangiomas  2 Unlikely  topical treatment  [name] BSN, RN, [place] 08/12/2018 3:45 PM

## 2018-08-23 ENCOUNTER — Telehealth: Payer: Self-pay | Admitting: *Deleted

## 2018-08-23 NOTE — Telephone Encounter (Signed)
08/23/2018 Left voice mail message for patient requesting a return call at my direct number [phone] to discuss new study information. Also noted that I would be in the office today and tomorrow and that patient could leave a message regarding the best time for me to reach her. [name] BSN, RN, [place] 08/23/2018 4:22 PM

## 2018-08-24 ENCOUNTER — Encounter: Payer: Self-pay | Admitting: *Deleted

## 2018-08-24 DIAGNOSIS — C50412 Malignant neoplasm of upper-outer quadrant of left female breast: Secondary | ICD-10-CM

## 2018-08-24 DIAGNOSIS — Z170 Estrogen receptor positive status [ER+]: Secondary | ICD-10-CM

## 2018-08-24 NOTE — Research (Signed)
08/24/2018 Spoke with patient by phone today to inform her of the Interim Analysis 2 findings for the AFT-05 PALLAS study, showing that "there was very little chance that the addition of palbociclib to endocrine therapy significantly reduced cancer recurrence, compared to endocrine therapy alone." In addition, the patient was informed that "there was no sign of unexpected toxicity or harm to people taking palbociclib with endocrine therapy". Explained to patient that the new study information does not alter her current participation in the Follow-up phase. Explained to patient that endocrine therapy will continue in the follow-up phase, as well as ongoing follow-up assessments. Patient is in agreement with this plan. Notified patient that she will be receiving a patient letter in the mail regarding the study findings to date. Patient appreciated being given this new information. Thanked patient for her participation and for her contributions to the trial. She is encouraged to contact our office for any questions or concerns that she might have, following receipt of the patient letter. [name] BSN, RN, CCRP 08/24/2018 11:00 AM

## 2018-08-24 NOTE — Telephone Encounter (Signed)
08/24/2018 Received return phone call from patient this morning. See Research Encounter for documentation. [name] BSN, RN, CCRP 08/24/2018 10:39 AM

## 2018-10-08 ENCOUNTER — Other Ambulatory Visit: Payer: Self-pay | Admitting: Family Medicine

## 2018-10-08 ENCOUNTER — Other Ambulatory Visit: Payer: Self-pay | Admitting: Physician Assistant

## 2018-10-08 MED ORDER — CLONAZEPAM 0.5 MG PO TABS: 0.5000 mg | ORAL_TABLET | Freq: Three times a day (TID) | ORAL | 0 refills | Status: DC | PRN

## 2018-10-08 NOTE — Telephone Encounter (Signed)
I went and reviewed her chart and PDMP, no red flags.  I am sending #30 to get her through until her appointment with Dr. Lajuana Ripple

## 2018-10-08 NOTE — Telephone Encounter (Signed)
Please advise 

## 2018-10-08 NOTE — Telephone Encounter (Signed)
I do not see any documentation on 10/07/18

## 2018-10-08 NOTE — Telephone Encounter (Signed)
I have already sent this in the morning

## 2018-10-08 NOTE — Telephone Encounter (Signed)
Patient aware.

## 2018-10-13 ENCOUNTER — Ambulatory Visit: Payer: PRIVATE HEALTH INSURANCE | Admitting: Family Medicine

## 2018-10-14 ENCOUNTER — Other Ambulatory Visit: Payer: Self-pay

## 2018-10-14 ENCOUNTER — Ambulatory Visit: Payer: PRIVATE HEALTH INSURANCE | Admitting: Family Medicine

## 2018-10-15 ENCOUNTER — Ambulatory Visit (INDEPENDENT_AMBULATORY_CARE_PROVIDER_SITE_OTHER): Payer: PRIVATE HEALTH INSURANCE | Admitting: Family Medicine

## 2018-10-15 ENCOUNTER — Other Ambulatory Visit: Payer: Self-pay | Admitting: Family Medicine

## 2018-10-15 ENCOUNTER — Encounter: Payer: Self-pay | Admitting: Family Medicine

## 2018-10-15 VITALS — BP 134/87 | HR 66 | Temp 98.6°F | Ht 65.0 in | Wt 170.0 lb

## 2018-10-15 DIAGNOSIS — H8109 Meniere's disease, unspecified ear: Secondary | ICD-10-CM

## 2018-10-15 DIAGNOSIS — F418 Other specified anxiety disorders: Secondary | ICD-10-CM | POA: Diagnosis not present

## 2018-10-15 DIAGNOSIS — R3121 Asymptomatic microscopic hematuria: Secondary | ICD-10-CM

## 2018-10-15 DIAGNOSIS — Z79899 Other long term (current) drug therapy: Secondary | ICD-10-CM | POA: Diagnosis not present

## 2018-10-15 DIAGNOSIS — R3129 Other microscopic hematuria: Secondary | ICD-10-CM

## 2018-10-15 DIAGNOSIS — Z170 Estrogen receptor positive status [ER+]: Secondary | ICD-10-CM

## 2018-10-15 DIAGNOSIS — C50412 Malignant neoplasm of upper-outer quadrant of left female breast: Secondary | ICD-10-CM

## 2018-10-15 LAB — URINALYSIS, COMPLETE
Bilirubin, UA: NEGATIVE
Glucose, UA: NEGATIVE
Ketones, UA: NEGATIVE
Leukocytes,UA: NEGATIVE
Nitrite, UA: NEGATIVE
Protein,UA: NEGATIVE
Specific Gravity, UA: >1.03 — ABNORMAL HIGH (ref 1.005–1.030)
Urobilinogen, Ur: 0.2 mg/dL (ref 0.2–1.0)
pH, UA: 5.5 (ref 5.0–7.5)

## 2018-10-15 LAB — MICROSCOPIC EXAMINATION: Renal Epithel, UA: NONE SEEN /hpf

## 2018-10-15 MED ORDER — CLONAZEPAM 0.5 MG PO TABS: 0.5000 mg | ORAL_TABLET | Freq: Three times a day (TID) | ORAL | 3 refills | Status: DC | PRN

## 2018-10-15 NOTE — Addendum Note (Signed)
Addended by: [name] on: 10/15/2018 09:43 AM   Modules accepted: Orders

## 2018-10-15 NOTE — Progress Notes (Signed)
Subjective: CC: Meniere's, GAD, Breast cancer PCP: [name], DO QIO:NGEXBM [name] is a 58 y.o. female presenting to clinic today for:  1.  Mnire's disease/anxiety disorder/breast cancer Patient with several year history of Mnire's disease.  She has been treated with various medications in the past but nothing was effective except for Klonopin.  She does report regular use of this.  She does not report excessive sedation.  She has had issues with memory loss in the past but she attributes this to chemotherapy for R breast cancer.  She has been stable on Aricept and notes that she is functioning normally now.  She is followed very closely by Dr. Payton Mccallum every 6 months for breast cancer.  She is currently treated with Arimidex.  She also is on Fosamax for bone loss.  She has been on Fosamax for less than 1 year now.  She reports good control of anxiety with Paxil.   ROS: Per HPI  Allergies  Allergen Reactions  . Statins Other (See Comments)  . Crestor [Rosuvastatin] Other (See Comments)    myalgias  . Lipitor [Atorvastatin] Other (See Comments)    myalgias  . Temazepam Other (See Comments)    Pt does not remember (Restoril)   Past Medical History:  Diagnosis Date  . Abnormal Pap smear of cervix 2002   Dr. Eilleen Kempf   . Allergy 2012   seasonal  . Anxiety   . Arthritis   . Benign hemangioma    Dr,  Marybelle Killings   . Breast cancer ([place]) 07/20/14   Left Breast  . Cervical stenosis (uterine cervix) 2002  . Depression   . Dizziness 1991   secondary to Meniere's disease  . Hyperlipidemia 2007  . Insomnia 1997  . Meniere's disease 1991  . Nervousness(799.21) 1987    Current Outpatient Medications:  .  acetaminophen (TYLENOL) 325 MG tablet, Take 975 mg by mouth daily as needed for moderate pain. Usually takes twice daily in morning and bedtime and additionally during the day if needed for pain, Disp: , Rfl:  .  alendronate (FOSAMAX) 70 MG tablet, Take 1 tablet (70 mg  total) by mouth once a week. Take with a full glass of water on an empty stomach., Disp: 12 tablet, Rfl: 3 .  anastrozole (ARIMIDEX) 1 MG tablet, Take 1 tablet (1 mg total) by mouth daily., Disp: 90 tablet, Rfl: 3 .  Biotin 1000 MCG tablet, Take 1,000 mcg by mouth daily., Disp: , Rfl:  .  CALCIUM PO, Take 1 tablet by mouth daily., Disp: , Rfl:  .  clonazePAM (KLONOPIN) 0.5 MG tablet, Take 1 tablet (0.5 mg total) by mouth 3 (three) times daily as needed., Disp: 30 tablet, Rfl: 0 .  donepezil (ARICEPT) 10 MG tablet, Take 1 tablet (10 mg total) by mouth at bedtime., Disp: 90 tablet, Rfl: 3 .  Investigational aspirin/placebo 100 MG tablet [facility] [id number], Take 1 tablet by mouth daily. Take with food or a full glass of water. Do not crush enteric coated tablets., Disp: 200 tablet, Rfl: 0 .  meclizine (ANTIVERT) 25 MG tablet, Take 1 tablet (25 mg total) by mouth 3 (three) times daily as needed for dizziness., Disp: 30 tablet, Rfl: 1 .  PARoxetine (PAXIL-CR) 25 MG 24 hr tablet, Take 1 tablet (25 mg total) by mouth daily., Disp: 90 tablet, Rfl: 3 .  rosuvastatin (CRESTOR) 10 MG tablet, Take 1 tablet (10 mg total) by mouth daily., Disp: 90 tablet, Rfl: 3 .  Turmeric 500 MG  TABS, Take 1 tablet by mouth daily., Disp: , Rfl:  .  Vitamin D, Ergocalciferol, (DRISDOL) 1.25 MG (50000 UT) CAPS capsule, TAKE 1 CAPSULE (50,000 UNITS TOTAL) BY MOUTH EVERY 7 (SEVEN) DAYS., Disp: 12 capsule, Rfl: 3 Social History   Socioeconomic History  . Marital status: Married    Spouse name: Not on file  . Number of children: Not on file  . Years of education: Not on file  . Highest education level: Not on file  Occupational History  . Not on file  Social Needs  . Financial resource strain: Not on file  . Food insecurity    Worry: Not on file    Inability: Not on file  . Transportation needs    Medical: Not on file    Non-medical: Not on file  Tobacco Use  . Smoking status: Never Smoker  . Smokeless tobacco: Never  Used  Substance and Sexual Activity  . Alcohol use: Yes    Comment: very rare  . Drug use: No  . Sexual activity: Yes    Birth control/protection: Surgical    Comment: ablation  Lifestyle  . Physical activity    Days per week: Not on file    Minutes per session: Not on file  . Stress: Not on file  Relationships  . Social Herbalist on phone: Not on file    Gets together: Not on file    Attends religious service: Not on file    Active member of club or organization: Not on file    Attends meetings of clubs or organizations: Not on file    Relationship status: Not on file  . Intimate partner violence    Fear of current or ex partner: Not on file    Emotionally abused: Not on file    Physically abused: Not on file    Forced sexual activity: Not on file  Other Topics Concern  . Not on file  Social History Narrative  . Not on file   Family History  Problem Relation Age of Onset  . Cirrhosis Father   . Colon polyps Mother   . Colon cancer Neg Hx   . Esophageal cancer Neg Hx   . Rectal cancer Neg Hx   . Stomach cancer Neg Hx     Objective: Office vital signs reviewed. BP 134/87   Pulse 66   Temp 98.6 F (37 C) (Oral)   Ht 5\' 5"  (1.651 m)   Wt 170 lb (77.1 kg)   BMI 28.29 kg/m   Physical Examination:  General: Awake, alert, well nourished, well appearing. No acute distress HEENT: Normal, sclera white, MMM Cardio: regular rate and rhythm, S1S2 heard, no murmurs appreciated Pulm: clear to auscultation bilaterally, no wheezes, rhonchi or rales; normal work of breathing on room air Neuro: Alert and oriented.  Psych: Mood stable, speech normal, affect appropriate, pleasant and interactive.  Does not appear to be responding to internal stimuli. Depression screen [facility] 2/9 10/15/2018 04/15/2018 11/11/2017 10/15/2017 04/16/2017  Decreased Interest 0 0 0 0 0  Down, Depressed, Hopeless 0 0 0 0 0  PHQ - 2 Score 0 0 0 0 0  Altered sleeping 0 - - - -  Tired, decreased energy  0 - - - -  Change in appetite 0 - - - -  Feeling bad or failure about yourself  0 - - - -  Trouble concentrating 0 - - - -  Moving slowly or fidgety/restless 0 - - - -  Suicidal thoughts 0 - - - -  PHQ-9 Score 0 - - - -  Some recent data might be hidden   GAD 7 : Generalized Anxiety Score 10/15/2018  Nervous, Anxious, on Edge 1  Control/stop worrying 1  Worry too much - different things 0  Trouble relaxing 1  Restless 0  Easily annoyed or irritable 0  Afraid - awful might happen 0  Total GAD 7 Score 3  Anxiety Difficulty Not difficult at all    Assessment/ Plan: Ms. Soule is an extremely pleasant 58 year old female with history of breast cancer.  She has 4 years in remission and followed regularly by Dr. Lindi Adie.  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  She has done very well on this and has refills through February of next year.  1. Anxiety with depression Stable - ToxASSURE Select 13 (MW), Urine - clonazePAM (KLONOPIN) 0.5 MG tablet; Take 1 tablet (0.5 mg total) by mouth 3 (three) times daily as needed.  Dispense: 90 tablet; Refill: 3  2. Meniere's disease, unspecified laterality Stable.  The national chronic database was reviewed and there were no red flags.  Check tox assure and complete controlled substance contract today.  We discussed the risks of benzodiazepine use and memory loss going forward. - clonazePAM (KLONOPIN) 0.5 MG tablet; Take 1 tablet (0.5 mg total) by mouth 3 (three) times daily as needed.  Dispense: 90 tablet; Refill: 3  3. Malignant neoplasm of upper-outer quadrant of left breast in female, estrogen receptor positive (HCC) - CBC - Basic Metabolic Panel - Urinalysis  4. Controlled substance agreement signed - ToxASSURE Select 13 (MW), Urine   No orders of the defined types were placed in this encounter.  No orders of the defined types were placed in this encounter.    [name], DO [place] [phone]

## 2018-10-15 NOTE — Patient Instructions (Addendum)
It was wonderful meeting you today!  Dr Laurance Flatten has refills on all of your medications through February of 2021.  I did go ahead and send in 4 more fills of the Klonopin.  You had labs performed today.  You will be contacted with the results of the labs once they are available, usually in the next 3 business days for routine lab work.  If you have an active my chart account, they will be released to your MyChart.  If you prefer to have these labs released to you via telephone, please let us know.  If you had a pap smear or biopsy performed, expect to be contacted in about 7-10 days.   We discussed the risk of Klonopin/ Benzodiazepine class of medications and dementia/ memory diseases today.  Controlled Substance Guidelines:  1. You cannot get an early refill, even it is lost.  2. You cannot get controlled medications from any other doctor, unless it is the emergency department and related to a new problem or injury.  3. You cannot use alcohol, marijuana, cocaine or any other recreational drugs while using this medication. This is very dangerous.  4. You are willing to have your urine drug tested at each visit.  5. You will not drive while using this medication, because that can put yourself and others in serious danger of an accident. 6. If any medication is stolen, then there must be a police report to verify it, or it cannot be refilled.  7. I will not prescribe these medications for longer than 3 months.  8. You must bring your pill bottle to each visit.  9. You must use the same pharmacy for all refills for the medication, unless you clear it with me beforehand.  10. You cannot share or sell this medication.

## 2018-10-16 LAB — BASIC METABOLIC PANEL
BUN/Creatinine Ratio: 14 (ref 9–23)
BUN: 8 mg/dL (ref 6–24)
CO2: 23 mmol/L (ref 20–29)
Calcium: 8.9 mg/dL (ref 8.7–10.2)
Chloride: 105 mmol/L (ref 96–106)
Creatinine, Ser: 0.59 mg/dL (ref 0.57–1.00)
GFR calc Af Amer: 118 mL/min/{1.73_m2} (ref >59)
GFR calc non Af Amer: 103 mL/min/{1.73_m2} (ref >59)
Glucose: 100 mg/dL — ABNORMAL HIGH (ref 65–99)
Potassium: 3.8 mmol/L (ref 3.5–5.2)
Sodium: 145 mmol/L — ABNORMAL HIGH (ref 134–144)

## 2018-10-16 LAB — CBC
Hematocrit: 40 % (ref 34.0–46.6)
Hemoglobin: 13.4 g/dL (ref 11.1–15.9)
MCH: 31.9 pg (ref 26.6–33.0)
MCHC: 33.5 g/dL (ref 31.5–35.7)
MCV: 95 fL (ref 79–97)
Platelets: 203 10*3/uL (ref 150–450)
RBC: 4.2 x10E6/uL (ref 3.77–5.28)
RDW: 13 % (ref 11.7–15.4)
WBC: 4.7 10*3/uL (ref 3.4–10.8)

## 2018-10-20 DIAGNOSIS — Z923 Personal history of irradiation: Secondary | ICD-10-CM | POA: Insufficient documentation

## 2018-10-20 LAB — TOXASSURE SELECT 13 (MW), URINE

## 2018-11-05 ENCOUNTER — Telehealth: Payer: Self-pay | Admitting: Family Medicine

## 2018-11-05 NOTE — Telephone Encounter (Signed)
Patient has a form for insurance that she is brining by to be filled out.

## 2018-11-16 NOTE — Telephone Encounter (Signed)
Yes, this has been completed since Friday.  It is still waiting to be picked up in the out box.

## 2018-11-17 NOTE — Telephone Encounter (Signed)
Patient aware.

## 2018-12-24 ENCOUNTER — Telehealth: Payer: Self-pay | Admitting: Family Medicine

## 2018-12-24 NOTE — Telephone Encounter (Signed)
Spoke to pt and she gave me the number for her new insurance to call regarding the "pre-approval". I called and the insurance company gave her a transitional 30 day supply of the medication and she would need PA to continue to get the rx. I did the PA over the phone and since it is an anti-depressant and she has been on it since 06/18/2012 she is able to get the medication covered with the Grandfather clause. They will fax the confirmation over in 3-4 business days but the co-pay will still end up being $60 monthly. Pt made aware of this and voiced understanding.

## 2018-12-24 NOTE — Telephone Encounter (Signed)
Pt called = she states that paroxetine was going to be $60, insurance says that Dr Gabriel Carina needs to give a one time "Pre-approval"   It this something you can do.

## 2018-12-28 ENCOUNTER — Inpatient Hospital Stay: Payer: 59 | Attending: Hematology and Oncology | Admitting: Adult Health

## 2018-12-28 ENCOUNTER — Encounter: Payer: Self-pay | Admitting: *Deleted

## 2018-12-28 ENCOUNTER — Other Ambulatory Visit: Payer: Self-pay

## 2018-12-28 ENCOUNTER — Inpatient Hospital Stay: Payer: 59

## 2018-12-28 ENCOUNTER — Telehealth: Payer: Self-pay

## 2018-12-28 ENCOUNTER — Encounter: Payer: Self-pay | Admitting: Adult Health

## 2018-12-28 VITALS — BP 137/90 | HR 68 | Temp 98.7°F | Resp 18 | Ht 65.0 in | Wt 169.5 lb

## 2018-12-28 DIAGNOSIS — Z0184 Encounter for antibody response examination: Secondary | ICD-10-CM | POA: Diagnosis not present

## 2018-12-28 DIAGNOSIS — C50412 Malignant neoplasm of upper-outer quadrant of left female breast: Secondary | ICD-10-CM

## 2018-12-28 DIAGNOSIS — Z006 Encounter for examination for normal comparison and control in clinical research program: Secondary | ICD-10-CM | POA: Diagnosis not present

## 2018-12-28 DIAGNOSIS — Z9221 Personal history of antineoplastic chemotherapy: Secondary | ICD-10-CM | POA: Diagnosis not present

## 2018-12-28 DIAGNOSIS — Z170 Estrogen receptor positive status [ER+]: Secondary | ICD-10-CM

## 2018-12-28 DIAGNOSIS — M810 Age-related osteoporosis without current pathological fracture: Secondary | ICD-10-CM | POA: Diagnosis not present

## 2018-12-28 DIAGNOSIS — Z923 Personal history of irradiation: Secondary | ICD-10-CM | POA: Diagnosis not present

## 2018-12-28 DIAGNOSIS — Z8619 Personal history of other infectious and parasitic diseases: Secondary | ICD-10-CM

## 2018-12-28 DIAGNOSIS — Z79811 Long term (current) use of aromatase inhibitors: Secondary | ICD-10-CM | POA: Diagnosis not present

## 2018-12-28 DIAGNOSIS — M8580 Other specified disorders of bone density and structure, unspecified site: Secondary | ICD-10-CM | POA: Insufficient documentation

## 2018-12-28 LAB — CBC WITH DIFFERENTIAL (CANCER CENTER ONLY)
Abs Immature Granulocytes: 0 10*3/uL (ref 0.00–0.07)
Basophils Absolute: 0 10*3/uL (ref 0.0–0.1)
Basophils Relative: 1 %
Eosinophils Absolute: 0 10*3/uL (ref 0.0–0.5)
Eosinophils Relative: 1 %
HCT: 39.7 % (ref 36.0–46.0)
Hemoglobin: 13.6 g/dL (ref 12.0–15.0)
Immature Granulocytes: 0 %
Lymphocytes Relative: 41 %
Lymphs Abs: 1.7 10*3/uL (ref 0.7–4.0)
MCH: 32.2 pg (ref 26.0–34.0)
MCHC: 34.3 g/dL (ref 30.0–36.0)
MCV: 94.1 fL (ref 80.0–100.0)
Monocytes Absolute: 0.4 10*3/uL (ref 0.1–1.0)
Monocytes Relative: 9 %
Neutro Abs: 2 10*3/uL (ref 1.7–7.7)
Neutrophils Relative %: 48 %
Platelet Count: 189 10*3/uL (ref 150–400)
RBC: 4.22 MIL/uL (ref 3.87–5.11)
RDW: 12.6 % (ref 11.5–15.5)
WBC Count: 4.1 10*3/uL (ref 4.0–10.5)
nRBC: 0 % (ref 0.0–0.2)

## 2018-12-28 LAB — CMP (CANCER CENTER ONLY)
ALT: 24 U/L (ref 0–44)
AST: 19 U/L (ref 15–41)
Albumin: 4.3 g/dL (ref 3.5–5.0)
Alkaline Phosphatase: 59 U/L (ref 38–126)
Anion gap: 10 (ref 5–15)
BUN: 8 mg/dL (ref 6–20)
CO2: 24 mmol/L (ref 22–32)
Calcium: 8.9 mg/dL (ref 8.9–10.3)
Chloride: 106 mmol/L (ref 98–111)
Creatinine: 0.63 mg/dL (ref 0.44–1.00)
GFR, Est AFR Am: >60 mL/min (ref >60)
GFR, Estimated: >60 mL/min (ref >60)
Glucose, Bld: 100 mg/dL — ABNORMAL HIGH (ref 70–99)
Potassium: 3.7 mmol/L (ref 3.5–5.1)
Sodium: 140 mmol/L (ref 135–145)
Total Bilirubin: 0.7 mg/dL (ref 0.3–1.2)
Total Protein: 6.8 g/dL (ref 6.5–8.1)

## 2018-12-28 LAB — SAR COV2 SEROLOGY (COVID19)AB(IGG),IA: SARS-CoV-2 Ab, IgG: NONREACTIVE

## 2018-12-28 NOTE — Telephone Encounter (Signed)
Spoke with patient and informed of all lab results.  Patient voiced understanding and says that she and her PCP are following her glucose levels.

## 2018-12-28 NOTE — Progress Notes (Signed)
[facility] Follow up:    Jade Norlander, DO [place] [address]   DIAGNOSIS: Cancer Staging Malignant neoplasm of upper-outer quadrant of left breast in female, estrogen receptor positive ([place]) Staging form: Breast, AJCC 7th Edition - Clinical stage from 08/01/2014: Stage IIA (T2, N0, M0) - Unsigned - Pathologic stage from 08/08/2014: Stage IIB (T2, N1c, cM0) - Unsigned   SUMMARY OF ONCOLOGIC HISTORY: Oncology History  Malignant neoplasm of upper-outer quadrant of left breast in female, estrogen receptor positive ([place])  07/18/2014 Mammogram   Distortion left breast, breast density category C; U/S 1.3 x 0.8 x 0.8 cm left breast mass at 2:00 position 4 cm from nipple, no lymph nodes   07/20/2014 Initial Diagnosis   Left breast Biopsy: Invasive lobular cancer with LCIS, Grade 1, ER 86%, PR 78%, Her 2 Neg Ratio 1.77, Ki 67: 14%   08/01/2014 Breast MRI   Breast MRI showed non-mass enhancement 3.9 cm, no lymph nodes   08/01/2014 Clinical Stage   Stage IIA: T2 N0   08/08/2014 Surgery   Left breast lumpectomy: 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, with LCIS, medial and inferior margins positive, 3/4 lymph nodes positive   08/08/2014 Pathologic Stage   Stage IIB: T2 N1c M0   08/16/2014 Surgery   Left breast medial margin reexcision residual ILC 0.2 cm; inferior margin residual foci less than 0.2 cm, 1/5 lymph nodes positive, 1 lymph node with isolated tumor cells (Overall 4/10)   09/04/2014 - 01/15/2015 Chemotherapy   Adjuvant chemotherapy with dose dense Adriamycin and Cytoxan 4 followed by Abraxane weekly 8 ( stopped early for profound neutropenia and thrombocytopenia)   01/29/2015 - 03/20/2015 Radiation Therapy   Adjuvant XRT [facility]): 50.4 Gy over 28 fractions; seroma boost: 10 Gy over 5 fractions. Total dose: 60.4 Gy   04/11/2015 -  Anti-estrogen oral therapy   Anastrozole 1 mg daily (PALLAS clinical trial 05/24/2015 patient was randomized to  hormone therapy alone)   05/24/2015 Survivorship   Survivorship visit completed and copy of care plan given to patient   11/26/2016 PET scan   Small Left sided level 2 LN with hypermetabolism, post surg changes in breast and small right groin lymph node likely infectious/inflammatory, gallstones     CURRENT THERAPY: Anastrozole daily  INTERVAL HISTORY: [name] 58 y.o. female returns for evaluation and follow up of her estrogen positive breast cancer.    She continues on Anastrozole daily. She has not had any vaginal dryness, arthralgias, or hot flashes.  She has noted an increase in her weight.  She is exercising daily by walking 3 miles daily and she does aerobics, with active yoga for 30 minutes, 5 days a week.  She has an exercise room in her home and enjoys the stair stepper and cycling.    She is on ABC study on Aspirin 141m this is currently month 42.  She is bruising easier than usual, this is generalized--grade 2.  She has no gastritis, dyspepsia, hematuria, epistaxis, melena, or hematochezia. She has not taken any NSAIDs, and is only taking tylenol if needed for a significant headache, or breast procedures. This is only sparingly, when she absolutely has to.    Since her last visit, [facility] of [facility] and breast density category C.  On 07/22/2017 she underwent bone density testing that showed osteoporosis with a T score of -2.5.  She is taking Fosamax weekly for  her bones, in addition to calcium and vitamin d supplementation.  She also does plenty of weight bearing exercises listed above.   Since her last visit, she had a breast procedure.  In her right breast she developed a sore spot underneath her nipple.  She had this area removed at the recommendation of Dr. Iran Planas.  The area was noted to have been benign.    Patient is requesting COVID antibody test today because she thinks she had the virus in  02/2018 because of her symptoms and how sick she felt.  Patient Active Problem List   Diagnosis Date Noted  . Aortic atherosclerosis ([place]) 04/16/2017  . Nodule of upper lobe of right lung 04/16/2017  . Anxiety with depression 10/08/2016  . Insomnia 09/22/2016  . Bronchitis 05/26/2016  . Vitamin D deficiency 04/07/2016  . Easy bruising 03/20/2016  . 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 Anemia associated with chemotherapy 10/24/2014  . Malignant neoplasm of upper-outer quadrant of left breast in female, estrogen receptor positive ([place]) 07/31/2014  . Hyperlipidemia 09/06/2012  . Generalized anxiety disorder 09/06/2012  . Meniere's disease 09/06/2012  . Statin intolerance 09/06/2012  . Left rotator cuff tear 09/06/2012    is allergic to statins; crestor [rosuvastatin]; lipitor [atorvastatin]; and temazepam.  MEDICAL HISTORY: Past Medical History:  Diagnosis Date  . Abnormal Pap smear of cervix 2002   Dr. Eilleen Kempf   . Allergy 2012   seasonal  . Anxiety   . Arthritis   . Benign hemangioma    Dr,  Marybelle Killings   . Breast cancer ([place]) 07/20/14   Left Breast  . Cervical stenosis (uterine cervix) 2002  . Depression   . Dizziness 1991   secondary to Meniere's disease  . Hyperlipidemia 2007  . Insomnia 1997  . Meniere's disease 1991  . Nervousness(799.21) 1987    SURGICAL HISTORY: Past Surgical History:  Procedure Laterality Date  . AXILLARY LYMPH NODE DISSECTION Left 08/16/2014   Procedure: AXILLARY LYMPH NODE DISSECTION;  Surgeon: Coralie Keens, MD;  Location: [place];  Service: General;  Laterality: Left;  . bilateral  tubal ligation  05/08/2004  . BREAST LUMPECTOMY WITH RADIOACTIVE SEED AND SENTINEL LYMPH NODE BIOPSY Left 08/08/2014   Procedure: BREAST LUMPECTOMY WITH RADIOACTIVE SEED AND SENTINEL LYMPH NODE BIOPSY;  Surgeon: Coralie Keens, MD;  Location: [place];  Service: General;  Laterality: Left;  . CHOLECYSTECTOMY  12/30/2016  .  COLONOSCOPY    . MASS EXCISION Left 01/08/2018   Procedure: EXCISION MUCOID CYST, DEBRIDEMENT DISTAL INTERPHALANGEAL LEFT MIDDLE FINGER;  Surgeon: Daryll Brod, MD;  Location: [place];  Service: Orthopedics;  Laterality: Left;  Marland Kitchen MASTOPEXY Right 12/14/2015   Procedure: RIGHT BREAST MASTOPEXY;  Surgeon: Irene Limbo, MD;  Location: [place];  Service: Plastics;  Laterality: Right;  . PORT-A-CATH REMOVAL N/A 03/08/2015   Procedure: REMOVAL PORT-A-CATH;  Surgeon: Coralie Keens, MD;  Location: [facility];  Service: General;  Laterality: N/A;  . PORTACATH PLACEMENT Right 08/16/2014   Procedure: INSERTION PORT-A-CATH;  Surgeon: Coralie Keens, MD;  Location: [employer];  Service: General;  Laterality: Right;  . RE-EXCISION OF BREAST LUMPECTOMY Left 08/16/2014   Procedure: RE-EXCISION OF LEFT BREAST CANCER AND PORT PLACEMENT/POSSIBLE AXILLARY DISECTION;  Surgeon: Coralie Keens, MD;  Location: [place];  Service: General;  Laterality: Left;  . right breast surg     [place] Right 07/28/2012   Procedure: RIGHT SHOULDER DECOMPRESSION AND  EXCISION OF CALCIFIC DEPOSIT;  Surgeon: Tobi Bastos, MD;  Location: [facility];  Service: Orthopedics;  Laterality: Right;  . TUBAL LIGATION    . uterine ablation  02/2006    Due to heavy periods Dr. Jon Billings   . wisdom teeth extractions  yrs ago    SOCIAL HISTORY: Social History   Socioeconomic History  . Marital status: Married    Spouse name: Not on file  . Number of children: Not on file  . Years of education: Not on file  . Highest education level: Not on file  Occupational History  . Not on file  Social Needs  . Financial resource strain: Not on file  . Food insecurity    Worry: Not on file    Inability: Not on file  . Transportation needs    Medical: Not on file    Non-medical: Not on file  Tobacco Use  . Smoking status: Never Smoker  .  Smokeless tobacco: Never Used  Substance and Sexual Activity  . Alcohol use: Yes    Comment: very rare  . Drug use: No  . Sexual activity: Yes    Birth control/protection: Surgical    Comment: ablation  Lifestyle  . Physical activity    Days per week: Not on file    Minutes per session: Not on file  . Stress: Not on file  Relationships  . Social Herbalist on phone: Not on file    Gets together: Not on file    Attends religious service: Not on file    Active member of club or organization: Not on file    Attends meetings of clubs or organizations: Not on file    Relationship status: Not on file  . Intimate partner violence    Fear of current or ex partner: Not on file    Emotionally abused: Not on file    Physically abused: Not on file    Forced sexual activity: Not on file  Other Topics Concern  . Not on file  Social History Narrative  . Not on file    FAMILY HISTORY: Family History  Problem Relation Age of Onset  . Cirrhosis Father   . Colon polyps Mother   . Colon cancer Neg Hx   . Esophageal cancer Neg Hx   . Rectal cancer Neg Hx   . Stomach cancer Neg Hx     Review of Systems  Constitutional: Negative for appetite change, chills, fatigue, fever and unexpected weight change.  HENT:   Negative for hearing loss, lump/mass and trouble swallowing.   Eyes: Negative for eye problems and icterus.  Respiratory: Negative for chest tightness, cough and shortness of breath.   Cardiovascular: Negative for chest pain, leg swelling and palpitations.  Gastrointestinal: Negative for abdominal distention, abdominal pain, constipation, diarrhea, nausea and vomiting.  Endocrine: Negative for hot flashes.  Genitourinary: Negative for difficulty urinating.   Musculoskeletal: Negative for arthralgias.  Skin: Negative for itching and rash.  Psychiatric/Behavioral: Negative for depression. The patient is not nervous/anxious.       PHYSICAL EXAMINATION  ECOG  PERFORMANCE STATUS: 1 - Symptomatic but completely ambulatory  Vitals:   12/28/18 0830  BP: 137/90  Pulse: 68  Resp: 18  Temp: 98.7 F (37.1 C)  SpO2: 98%    Physical Exam Constitutional:      General: She is not in acute distress.    Appearance: Normal appearance. She is not toxic-appearing.  HENT:     Head:  Normocephalic and atraumatic.     Nose: Nose normal.     Mouth/Throat:     Mouth: Mucous membranes are moist.     Pharynx: Oropharynx is clear. No oropharyngeal exudate.  Eyes:     General: No scleral icterus.    Pupils: Pupils are equal, round, and reactive to light.  Neck:     Musculoskeletal: Neck supple.  Cardiovascular:     Rate and Rhythm: Normal rate and regular rhythm.     Pulses: Normal pulses.     Heart sounds: Normal heart sounds. No murmur.  Pulmonary:     Effort: Pulmonary effort is normal.     Breath sounds: Normal breath sounds.     Comments: Right breast s/p reduction, benign, left breast s/p lumpectomy and radiation, no sign of local recurrence Abdominal:     General: Abdomen is flat. Bowel sounds are normal. There is no distension.     Palpations: Abdomen is soft.  Musculoskeletal:        General: No swelling.  Lymphadenopathy:     Cervical: No cervical adenopathy.  Skin:    General: Skin is warm and dry.     Capillary Refill: Capillary refill takes less than 2 seconds.     Findings: No lesion or rash.  Neurological:     General: No focal deficit present.     Mental Status: She is alert and oriented to person, place, and time.  Psychiatric:        Mood and Affect: Mood normal.        Behavior: Behavior normal.     LABORATORY DATA:  CBC    Component Value Date/Time   WBC 4.7 10/15/2018 0849   WBC 5.7 07/20/2018 0914   WBC 5.7 03/09/2017 0804   WBC 8.6 01/01/2017 1119   RBC 4.20 10/15/2018 0849   RBC 4.31 07/20/2018 0914   HGB 13.4 10/15/2018 0849   HGB 13.3 03/09/2017 0804   HCT 40.0 10/15/2018 0849   HCT 39.9 03/09/2017 0804    PLT 203 10/15/2018 0849   MCV 95 10/15/2018 0849   MCV 96.8 03/09/2017 0804   MCH 31.9 10/15/2018 0849   MCH 32.3 07/20/2018 0914   MCHC 33.5 10/15/2018 0849   MCHC 33.4 07/20/2018 0914   RDW 13.0 10/15/2018 0849   RDW 13.0 03/09/2017 0804   LYMPHSABS 2.1 07/20/2018 0914   LYMPHSABS 2.0 04/15/2018 1041   LYMPHSABS 2.4 03/09/2017 0804   MONOABS 0.5 07/20/2018 0914   MONOABS 0.5 03/09/2017 0804   EOSABS 0.0 07/20/2018 0914   EOSABS 0.1 04/15/2018 1041   BASOSABS 0.0 07/20/2018 0914   BASOSABS 0.0 04/15/2018 1041   BASOSABS 0.0 03/09/2017 0804    CMP     Component Value Date/Time   NA 145 (H) 10/15/2018 0849   NA 142 03/09/2017 0804   K 3.8 10/15/2018 0849   K 3.7 03/09/2017 0804   CL 105 10/15/2018 0849   CO2 23 10/15/2018 0849   CO2 28 03/09/2017 0804   GLUCOSE 100 (H) 10/15/2018 0849   GLUCOSE 101 (H) 07/20/2018 0914   GLUCOSE 102 03/09/2017 0804   BUN 8 10/15/2018 0849   BUN 11.4 03/09/2017 0804   CREATININE 0.59 10/15/2018 0849   CREATININE 0.71 07/20/2018 0914   CREATININE 0.6 03/09/2017 0804   CALCIUM 8.9 10/15/2018 0849   CALCIUM 8.7 03/09/2017 0804   PROT 7.2 07/20/2018 0914   PROT 6.6 04/15/2018 1041   PROT 6.8 03/09/2017 0804   ALBUMIN 4.3 07/20/2018 0914  ALBUMIN 4.4 04/15/2018 1041   ALBUMIN 4.1 03/09/2017 0804   AST 20 07/20/2018 0914   AST 19 03/09/2017 0804   ALT 29 07/20/2018 0914   ALT 24 03/09/2017 0804   ALKPHOS 59 07/20/2018 0914   ALKPHOS 81 03/09/2017 0804   BILITOT 0.6 07/20/2018 0914   BILITOT 0.46 03/09/2017 0804   GFRNONAA 103 10/15/2018 0849   GFRNONAA >60 07/20/2018 0914   GFRNONAA >89 10/04/2012 1123   GFRAA 118 10/15/2018 0849   GFRAA >60 07/20/2018 0914   GFRAA >89 10/04/2012 1123     RADIOGRAPHIC STUDIES:          ASSESSMENT and THERAPY PLAN:   Malignant neoplasm of upper-outer quadrant of left breast in female, estrogen receptor positive ([place]) Left breast lumpectomy 08/08/2014: Invasive grade 1 lobular  carcinoma 2.6 cm, with LCIS, medial and inferior margins positive, 3/4 lymph nodes positive T2 N1 cM0 stage IIB, ER 86%, PR 78%, HER-2/neu negative ratio 1.77, Ki-67 14% Left breast medial margin reexcision 08/18/14: residual ILC 0.2 cm; inferior margin residual foci less than 0.2 cm, 1/5 lymph nodes positive, 1 lymph node with isolated tumor cells (Overall 4/10)  Treatment Summary: 1. Adjuvant chemotherapy with dose dense Adriamycin and Cytoxan x 4 followed by Abraxane weekly 12 because of lymph node positive disease. Started 09/04/14 to 01/15/15 2. adjuvant radiation completed 03/20/15 3. Followed by antiestrogen therapy started 04/03/15 -------------------------------------------------------------------------------------------------------------------------- Anastrozole toxicities: Patient denies any hot flashes. Weight gain Arthralgias and myalgias  Bone density 07/26/2015: T score -1.9, osteopenia  PALLAS Trial : Patient has been randomized to anastrozole alone. ABC clinical trial: The trial randomizes between aspirin versus placebo. She went on trial 07/26/2015 Currently on 100 mg of dosage. Bruising has become markedly reduced.  Adverse effects: 1. Bruising/ Hematoma: Resolved after dose reduction. Currently taking 100 mg of aspirin/placebo 2. bleeding from mucous membranes especially GI, vaginal, nose, gums, nailbeds: resolved, no evidence of this today 3. Osteoporosis: Fosamax weekly, DEXA due again in 07/2019 at [facility]; she was given a handout on bone health today.    Cognitive dysfunction: OnAricept 10 mg daily, followed by PCP  Weight gain:On Weight Watchers, now doing Keto diet, exercising  Breast cancer surveillance: 1. Mammogram normal in 07/2018, due again in 07/2019 2. Breast exam 12/2018: No palpable lumps or nodules of concern. 3. CT CAP 10/28/2017: No evidence of malignancy, radiation changes anterior left upper lobe   Health Maintenance:  The patient was  recommended healthy diet, exercise 30-45 minutes most days of the week, sun protection, to limit ETOH, and abstain from tobacco use.  She was recommended to continue to f/u with her PCP for her other age related cancer screenings for colon cancer, gynecologic cancers, and skin cancer.    Previous Viral Illness: I informed patient that I did not believe that the COVID antibody test would yield results due to the length of time that has passed since her viral illness.  The patient was insistent that she would feel more comfortable with antibody testing done today with our lab.  I placed the orders under the guidance of our lab staff.    Return to clinic in 6 months for follow-up with Dr. Lindi Adie.   Orders Placed This Encounter  Procedures  . CBC with Differential (Cancer Center Only)    Standing Status:   Future    Standing Expiration Date:   12/28/2019  . CMP ([facility] only)    Standing Status:   Future    Standing Expiration Date:  12/28/2019  . SAR CoV2 Serology (COVID 19)AB(IGG)IA    Standing Status:   Future    Standing Expiration Date:   12/28/2019    Order Specific Question:   Is this test for diagnosis or screening    Answer:   Screening    Order Specific Question:   Symptomatic for COVID-19 as defined by CDC    Answer:   No    Order Specific Question:   Hospitalized for COVID-19    Answer:   No    Order Specific Question:   Admitted to ICU for COVID-19    Answer:   No    Order Specific Question:   Previously tested for COVID-19    Answer:   No    Order Specific Question:   Resident in a congregate (group) care setting    Answer:   No    Order Specific Question:   Employed in healthcare setting    Answer:   No    Order Specific Question:   Pregnant    Answer:   No    All questions were answered. The patient knows to call the clinic with any problems, questions or concerns. We can certainly see the patient much sooner if necessary.  A total of (30) minutes of  face-to-face time was spent with this patient with greater than 50% of that time in counseling and care-coordination.  This note was electronically signed. Scot Dock, NP 12/28/2018

## 2018-12-28 NOTE — Assessment & Plan Note (Addendum)
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 Adjuvant chemotherapy with dose dense Adriamycin and Cytoxan x 4 followed by Abraxane weekly 12 because of lymph node positive disease. Started 09/04/14 to 01/15/15 2. adjuvant radiation completed 03/20/15 3. Followed by antiestrogen therapy started 04/03/15 -------------------------------------------------------------------------------------------------------------------------- Anastrozole toxicities: Patient denies any hot flashes. Weight gain Arthralgias and myalgias  Bone density 07/26/2015: T score -1.9, osteopenia  PALLAS Trial : Patient has been randomized to anastrozole alone. ABC clinical trial: The trial randomizes between aspirin versus placebo. She went on trial 07/26/2015 Currently on 100 mg of dosage. Bruising has become markedly reduced.  Adverse effects: 1. Bruising/ Hematoma: Resolved after dose reduction. Currently taking 100 mg of aspirin/placebo 2. bleeding from mucous membranes especially GI, vaginal, nose, gums, nailbeds: resolved, no evidence of this today 3. Osteoporosis: Fosamax weekly, DEXA due again in 07/2019 at [facility]; she was given a handout on bone health today.    Cognitive dysfunction: OnAricept 10 mg daily, followed by PCP  Weight gain:On Weight Watchers, now doing Keto diet, exercising  Breast cancer surveillance: 1. Mammogram normal in 07/2018, due again in 07/2019 2. Breast exam 12/2018: No palpable lumps or nodules of concern. 3. CT CAP 10/28/2017: No evidence of malignancy, radiation changes anterior left  upper lobe   Health Maintenance:  The patient was recommended healthy diet, exercise 30-45 minutes most days of the week, sun protection, to limit ETOH, and abstain from tobacco use.  She was recommended to continue to f/u with her PCP for her other age related cancer screenings for colon cancer, gynecologic cancers, and skin cancer.    Previous Viral Illness: I informed patient that I did not believe that the COVID antibody test would yield results due to the length of time that has passed since her viral illness.  The patient was insistent that she would feel more comfortable with antibody testing done today with our lab.  I placed the orders under the guidance of our lab staff.    Return to clinic in 6 months for follow-up with Dr. Gudena. 

## 2018-12-28 NOTE — Patient Instructions (Signed)
Bone Health Bones protect organs, store calcium, anchor muscles, and support the whole body. Keeping your bones strong is important, especially as you get older. You can take actions to help keep your bones strong and healthy. Why is keeping my bones healthy important?  Keeping your bones healthy is important because your body constantly replaces bone cells. Cells get old, and new cells take their place. As we age, we lose bone cells because the body may not be able to make enough new cells to replace the old cells. The amount of bone cells and bone tissue you have is referred to as bone mass. The higher your bone mass, the stronger your bones. The aging process leads to an overall loss of bone mass in the body, which can increase the likelihood of:  Joint pain and stiffness.  Broken bones.  A condition in which the bones become weak and brittle (osteoporosis). A large decline in bone mass occurs in older adults. In women, it occurs about the time of menopause. What actions can I take to keep my bones healthy? Good health habits are important for maintaining healthy bones. This includes eating nutritious foods and exercising regularly. To have healthy bones, you need to get enough of the right minerals and vitamins. Most nutrition experts recommend getting these nutrients from the foods that you eat. In some cases, taking supplements may also be recommended. Doing certain types of exercise is also important for bone health. What are the nutritional recommendations for healthy bones?  Eating a well-balanced diet with plenty of calcium and vitamin D will help to protect your bones. Nutritional recommendations vary from person to person. Ask your health care provider what is healthy for you. Here are some general guidelines. Get enough calcium Calcium is the most important (essential) mineral for bone health. Most people can get enough calcium from their diet, but supplements may be recommended for  people who are at risk for osteoporosis. Good sources of calcium include:  Dairy products, such as low-fat or nonfat milk, cheese, and yogurt.  Dark green leafy vegetables, such as bok choy and broccoli.  Calcium-fortified foods, such as orange juice, cereal, bread, soy beverages, and tofu products.  Nuts, such as almonds. Follow these recommended amounts for daily calcium intake:  Children, age 35-3: 700 mg.  Children, age 63-8: 1,000 mg.  Children, age 80-13: 1,300 mg.  Teens, age 634-18: 1,300 mg.  Adults, age 8-50: 1,000 mg.  Adults, age 57-70: ? Men: 1,000 mg. ? Women: 1,200 mg.  Adults, age 61 or older: 1,200 mg.  Pregnant and breastfeeding females: ? Teens: 1,300 mg. ? Adults: 1,000 mg. Get enough vitamin D Vitamin D is the most essential vitamin for bone health. It helps the body absorb calcium. Sunlight stimulates the skin to make vitamin D, so be sure to get enough sunlight. If you live in a cold climate or you do not get outside often, your health care provider may recommend that you take vitamin D supplements. Good sources of vitamin D in your diet include:  Egg yolks.  Saltwater fish.  Milk and cereal fortified with vitamin D. Follow these recommended amounts for daily vitamin D intake:  Children and teens, age 35-18: 600 international units.  Adults, age 83 or younger: 400-800 international units.  Adults, age 62 or older: 800-1,000 international units. Get other important nutrients Other nutrients that are important for bone health include:  Phosphorus. This mineral is found in meat, poultry, dairy foods, nuts, and legumes. The  recommended daily intake for adult men and adult women is 700 mg.  Magnesium. This mineral is found in seeds, nuts, dark green vegetables, and legumes. The recommended daily intake for adult men is 400-420 mg. For adult women, it is 310-320 mg.  Vitamin K. This vitamin is found in green leafy vegetables. The recommended daily  intake is 120 mg for adult men and 90 mg for adult women. What type of physical activity is best for building and maintaining healthy bones? Weight-bearing and strength-building activities are important for building and maintaining healthy bones. Weight-bearing activities cause muscles and bones to work against gravity. Strength-building activities increase the strength of the muscles that support bones. Weight-bearing and muscle-building activities include:  Walking and hiking.  Jogging and running.  Dancing.  Gym exercises.  Lifting weights.  Tennis and racquetball.  Climbing stairs.  Aerobics. Adults should get at least 30 minutes of moderate physical activity on most days. Children should get at least 60 minutes of moderate physical activity on most days. Ask your health care provider what type of exercise is best for you. How can I find out if my bone mass is low? Bone mass can be measured with an X-ray test called a bone mineral density (BMD) test. This test is recommended for all women who are age 21 or older. It may also be recommended for:  Men who are age 55 or older.  People who are at risk for osteoporosis because of: ? Having bones that break easily. ? Having a long-term disease that weakens bones, such as kidney disease or rheumatoid arthritis. ? Having menopause earlier than normal. ? Taking medicine that weakens bones, such as steroids, thyroid hormones, or hormone treatment for breast cancer or prostate cancer. ? Smoking. ? Drinking three or more alcoholic drinks a day. If you find that you have a low bone mass, you may be able to prevent osteoporosis or further bone loss by changing your diet and lifestyle. Where can I find more information? For more information, check out the following websites:  [place]: AviationTales.fr  [employer] of Health: www.bones.SouthExposed.es  International Osteoporosis Foundation:  Administrator.iofbonehealth.org Summary  The aging process leads to an overall loss of bone mass in the body, which can increase the likelihood of broken bones and osteoporosis.  Eating a well-balanced diet with plenty of calcium and vitamin D will help to protect your bones.  Weight-bearing and strength-building activities are also important for building and maintaining strong bones.  Bone mass can be measured with an X-ray test called a bone mineral density (BMD) test. This information is not intended to replace advice given to you by your health care provider. Make sure you discuss any questions you have with your health care provider. Document Released: 05/17/2003 Document Revised: 03/23/2017 Document Reviewed: 03/23/2017 Elsevier Patient Education  2020 [employer].

## 2018-12-28 NOTE — Research (Signed)
12/28/2018 at 10:43am - [facility] AFT-05 [facility] Arm B month 42 follow up- The pt was into the cancer center for her routine follow up and monitoring.  The pt was seen and examined by Gardenia Phlegm, NP today.  The pt is working remotely and is very active with her walking and exercise.  The pt denies any AE's related to her anastrozole except for weight gain.  The pt said that she eats healthy and exercises a lot, but she is unable to lose any weight.  The pt requested that she have routine blood work drawn today because she states that Dr. Lindi Adie always reviews her lab work with her.  The pt denies any medication changes or any new medications.  The pt had no questions or concerns about her study participation.  The pt will be seen in April 2021 for her month 48 visit. The pt was thanked for her support of this trial. Brion Aliment RN, BSN, CCRP Clinical Research Nurse 12/28/2018 10:49 AM

## 2018-12-28 NOTE — Research (Signed)
12/28/18 at 10:07am - ABC study/ [facility] [id number] - month 42 dispensing visit study notes - The pt was into the cancer center for her ABC month 42 visit.  The pt was seen and examined today by Gardenia Phlegm, NP.  The solicited AE checklist was reviewed with the pt.  The pt denied the following AE's:  Gastrointestinal bleeding, intracranial hemorrhage, epistaxis, hematuria, dyspepsia, and gastritis.  The pt did report bruising.  The pt described her bruising as "generalized".  The pt states she is tolerating her current dose of study drug well with no new problems.  The pt reports she just takes Tylenol occasionally for headaches.  The pt denies any aspirin or NSAID usage since her last follow up visit.  The pt reports that she is working remote and is very active.    Medication logs: Today patient returned medication logs with doses recorded from 07/20/18 through December 27, 2018. Patient was provided with new medication logs to begin recording doses today through the month of April  2020.  Investigational drug: One bottle of aspirin 100mg /placebo (#200) was dispensed to the patient today per protocol. Patient returned the following: - Bottle from 12/16/2017 shipment to patient, expiring 01/07/2018 was returned today with 82 pills inside.   - Bottle (#200) from 07/20/2018 had 43 pills inside.  Partial bottles were returned to pharmacist Raul Del for proper drug accountability.  The pt will be seen in April 2021 for her month 48 dispensing visit.  The pt was thanked for her support and compliance with this study.   Brion Aliment RN, BSN, CCRP  Clinical Research Nurse 12/28/2018 10:11 AM

## 2018-12-28 NOTE — Telephone Encounter (Signed)
-----   Message from Gardenia Phlegm, NP sent at 12/28/2018  1:45 PM EDT ----- Please call patient with results ----- Message ----- From: Buel Ream, Lab [facility] [place] Sent: 12/28/2018   9:39 AM EDT To: Gardenia Phlegm, NP

## 2018-12-29 ENCOUNTER — Other Ambulatory Visit: Payer: Self-pay | Admitting: Hematology and Oncology

## 2018-12-29 ENCOUNTER — Telehealth: Payer: Self-pay | Admitting: Hematology and Oncology

## 2018-12-29 ENCOUNTER — Encounter: Payer: Self-pay | Admitting: Hematology and Oncology

## 2018-12-29 NOTE — Telephone Encounter (Signed)
I talk with patient regarding schedule  

## 2018-12-30 MED ORDER — INV-ASPIRIN/PLACEBO 100 MG TABS ALLIANCE A011502: 1.0000 | ORAL_TABLET | Freq: Every day | ORAL | 0 refills | Status: DC

## 2019-01-17 NOTE — Research (Signed)
01/17/2019 Adverse Event Log  [facility] [id number] Month 42 06/28/2018 - 12/28/2018 Baseline AEs (grade): dizziness, int. (1); anxiety (2); joint pain, int. (2); seasonal allergies (1) Event Grade Attribution to aspirin/placebo Comments  Bruising 1 Probable Occasional bruising, none present on exam.  Weight gain 2 Possible    Hypertension 2 Possible    Hematuria, microscopic 1 Unlikely    Right nipple pain 2 Possible   Non-reportable grade 1 events: - Hyperglycemia - Urinary frequency & intermittency - Nocturia [name] BSN, RN, [place] 01/17/2019 4:12 PM

## 2019-02-14 ENCOUNTER — Other Ambulatory Visit: Payer: Self-pay

## 2019-02-15 ENCOUNTER — Other Ambulatory Visit: Payer: Self-pay

## 2019-02-15 ENCOUNTER — Encounter: Payer: Self-pay | Admitting: Family Medicine

## 2019-02-15 ENCOUNTER — Ambulatory Visit (INDEPENDENT_AMBULATORY_CARE_PROVIDER_SITE_OTHER): Payer: 59 | Admitting: Family Medicine

## 2019-02-15 VITALS — BP 122/84 | HR 64 | Temp 98.4°F | Ht 65.0 in | Wt 166.0 lb

## 2019-02-15 DIAGNOSIS — E7800 Pure hypercholesterolemia, unspecified: Secondary | ICD-10-CM | POA: Diagnosis not present

## 2019-02-15 DIAGNOSIS — C50412 Malignant neoplasm of upper-outer quadrant of left female breast: Secondary | ICD-10-CM

## 2019-02-15 DIAGNOSIS — H8109 Meniere's disease, unspecified ear: Secondary | ICD-10-CM

## 2019-02-15 DIAGNOSIS — Z170 Estrogen receptor positive status [ER+]: Secondary | ICD-10-CM

## 2019-02-15 DIAGNOSIS — F418 Other specified anxiety disorders: Secondary | ICD-10-CM | POA: Diagnosis not present

## 2019-02-15 DIAGNOSIS — H8103 Meniere's disease, bilateral: Secondary | ICD-10-CM | POA: Diagnosis not present

## 2019-02-15 DIAGNOSIS — E559 Vitamin D deficiency, unspecified: Secondary | ICD-10-CM

## 2019-02-15 MED ORDER — CLONAZEPAM 0.5 MG PO TABS: 0.5000 mg | ORAL_TABLET | Freq: Three times a day (TID) | ORAL | 3 refills | Status: DC | PRN

## 2019-02-15 NOTE — Patient Instructions (Signed)

## 2019-02-15 NOTE — Progress Notes (Signed)
Subjective: CC: Mnire'Jade disease, generalized anxiety disorder PCP: [name], DO MEQ:ASTMHD [name] is a 58 y.o. female presenting to clinic today for:  1.  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  She has been treated with various medications in the past but nothing was effective except for Klonopin.    She does report consistent use of Klonopin for Mnire'Jade disease.  She takes about 1-1/2 tablets/day but has needed up to 3 tablets/day in the past.  She keeps the others on reserve in case she has flares.  Denies falls, dizziness, excessive daytime sleepiness.  Memory is well controlled with Aricept.  She is compliant with Paxil.  Denies any instability of mood.  2.  History of breast cancer Patient with history of left-sided breast cancer status post resection of lymph nodes.  She has had some issues with left upper extremity swelling.  She is been using milking techniques and a lymphatic pump at home in efforts to relieve.  It has come down but is still slightly swollen.    ROS: Per HPI  Allergies  Allergen Reactions  . Statins Other (See Comments)  . Crestor [Rosuvastatin] Other (See Comments)    myalgias  . Lipitor [Atorvastatin] Other (See Comments)    myalgias  . Temazepam Other (See Comments)    Pt does not remember (Restoril)   Past Medical History:  Diagnosis Date  . Abnormal Pap smear of cervix 2002   Dr. Eilleen Kempf   . Allergy 2012   seasonal  . Anxiety   . Arthritis   . Benign hemangioma    Dr,  Marybelle Killings   . Breast cancer ([place]) 07/20/14   Left Breast  . Cervical stenosis (uterine cervix) 2002  . Depression   . Dizziness 1991   secondary to Meniere'Jade disease  . Hyperlipidemia 2007  . Insomnia 1997  . Meniere'Jade disease 1991  . Nervousness(799.21) 1987    Current Outpatient Medications:  .  acetaminophen (TYLENOL) 325 MG tablet, Take 975 mg by mouth daily as needed for moderate  pain. Usually takes twice daily in morning and bedtime and additionally during the day if needed for pain, Disp: , Rfl:  .  alendronate (FOSAMAX) 70 MG tablet, Take 1 tablet (70 mg total) by mouth once a week. Take with a full glass of water on an empty stomach., Disp: 12 tablet, Rfl: 3 .  anastrozole (ARIMIDEX) 1 MG tablet, Take 1 tablet (1 mg total) by mouth daily., Disp: 90 tablet, Rfl: 3 .  Biotin 1000 MCG tablet, Take 1,000 mcg by mouth daily., Disp: , Rfl:  .  CALCIUM PO, Take 1 tablet by mouth daily., Disp: , Rfl:  .  clonazePAM (KLONOPIN) 0.5 MG tablet, Take 1 tablet (0.5 mg total) by mouth 3 (three) times daily as needed., Disp: 90 tablet, Rfl: 3 .  donepezil (ARICEPT) 10 MG tablet, Take 1 tablet (10 mg total) by mouth at bedtime., Disp: 90 tablet, Rfl: 3 .  Investigational aspirin/placebo 100 MG tablet [facility] [id number], Take 1 tablet by mouth daily. Take with food or a full glass of water. Do not crush enteric coated tablets., Disp: 200 tablet, Rfl: 0 .  Investigational aspirin/placebo 100 MG tablet [facility] [id number], Take 1 tablet by mouth daily. Take with food or a full glass of water. Do not crush enteric coated tablets., Disp: 200 tablet, Rfl: 0 .  meclizine (ANTIVERT) 25 MG tablet, Take 1 tablet (25 mg total) by mouth 3 (three)  times daily as needed for dizziness., Disp: 30 tablet, Rfl: 1 .  PARoxetine (PAXIL-CR) 25 MG 24 hr tablet, Take 1 tablet (25 mg total) by mouth daily., Disp: 90 tablet, Rfl: 3 .  rosuvastatin (CRESTOR) 10 MG tablet, Take 1 tablet (10 mg total) by mouth daily., Disp: 90 tablet, Rfl: 3 .  Turmeric 500 MG TABS, Take 1 tablet by mouth daily., Disp: , Rfl:  .  Vitamin D, Ergocalciferol, (DRISDOL) 1.25 MG (50000 UT) CAPS capsule, TAKE 1 CAPSULE (50,000 UNITS TOTAL) BY MOUTH EVERY 7 (SEVEN) DAYS., Disp: 12 capsule, Rfl: 3 Social History   Socioeconomic History  . Marital status: Married    Spouse name: Not on file  . Number of children: Not on file  . Years of  education: Not on file  . Highest education level: Not on file  Occupational History  . Not on file  Social Needs  . Financial resource strain: Not on file  . Food insecurity    Worry: Not on file    Inability: Not on file  . Transportation needs    Medical: Not on file    Non-medical: Not on file  Tobacco Use  . Smoking status: Never Smoker  . Smokeless tobacco: Never Used  Substance and Sexual Activity  . Alcohol use: Yes    Comment: very rare  . Drug use: No  . Sexual activity: Yes    Birth control/protection: Surgical    Comment: ablation  Lifestyle  . Physical activity    Days per week: Not on file    Minutes per session: Not on file  . Stress: Not on file  Relationships  . Social Herbalist on phone: Not on file    Gets together: Not on file    Attends religious service: Not on file    Active member of club or organization: Not on file    Attends meetings of clubs or organizations: Not on file    Relationship status: Not on file  . Intimate partner violence    Fear of current or ex partner: Not on file    Emotionally abused: Not on file    Physically abused: Not on file    Forced sexual activity: Not on file  Other Topics Concern  . Not on file  Social History Narrative  . Not on file   Family History  Problem Relation Age of Onset  . Cirrhosis Father   . Colon polyps Mother   . Colon cancer Neg Hx   . Esophageal cancer Neg Hx   . Rectal cancer Neg Hx   . Stomach cancer Neg Hx     Objective: Office vital signs reviewed. BP 122/84   Pulse 64   Temp 98.4 F (36.9 C) (Temporal)   Ht 5\' 5"  (1.651 m)   Wt 166 lb (75.3 kg)   SpO2 99%   BMI 27.62 kg/m   Physical Examination:  General: Awake, alert, well nourished, well appearing. No acute distress HEENT: Normal, sclera white, MMM Chest: Several postsurgical scars noted in the left axilla.  There are no palpable masses. Cardio: regular rate and rhythm, S1S2 heard, no murmurs appreciated  Pulm: clear to auscultation bilaterally, no wheezes, rhonchi or rales; normal work of breathing on room air Extremities: Lymphedema noted in the left upper extremity that is roughly 1-1/2 times the size of her right upper extremity.  She has full active range of movement Psych: Mood stable, speech normal, affect appropriate, pleasant and interactive.  Depression screen [facility] 2/9 02/15/2019 10/15/2018 04/15/2018 11/11/2017 10/15/2017  Decreased Interest 0 0 0 0 0  Down, Depressed, Hopeless 0 0 0 0 0  PHQ - 2 Score 0 0 0 0 0  Altered sleeping 2 0 - - -  Tired, decreased energy 0 0 - - -  Change in appetite 0 0 - - -  Feeling bad or failure about yourself  0 0 - - -  Trouble concentrating 0 0 - - -  Moving slowly or fidgety/restless 0 0 - - -  Suicidal thoughts 0 0 - - -  PHQ-9 Score 2 0 - - -  Difficult doing work/chores Not difficult at all - - - -  Some recent data might be hidden   GAD 7 : Generalized Anxiety Score 02/15/2019 10/15/2018  Nervous, Anxious, on Edge 0 1  Control/stop worrying 1 1  Worry too much - different things 1 0  Trouble relaxing 1 1  Restless 1 0  Easily annoyed or irritable 0 0  Afraid - awful might happen - 0  Total GAD 7 Score - 3  Anxiety Difficulty Not difficult at all Not difficult at all    Assessment/ Plan:  1. Meniere'Jade disease, unspecified laterality Stable with as needed use of clonazepam.  I reviewed the national narcotic database and she has only had 1 refill since our last visit.  She is using this very appropriately and sparingly.  I have sent in a renewal to place on hold as her current will likely expire before she is able to get her next refill.  She will follow-up with me in 4 months, sooner if needed - clonazePAM (KLONOPIN) 0.5 MG tablet; Take 1 tablet (0.5 mg total) by mouth 3 (three) times daily as needed.  Dispense: 90 tablet; Refill: 3  2. Anxiety with depression Stable with use of Paxil.  Has benzodiazepine on board for Mnire'Jade disease -  clonazePAM (KLONOPIN) 0.5 MG tablet; Take 1 tablet (0.5 mg total) by mouth 3 (three) times daily as needed.  Dispense: 90 tablet; Refill: 3  3. Malignant neoplasm of upper-outer quadrant of left breast in female, estrogen receptor positive ([place]) On Arimidex and followed by oncology  4. Pure hypercholesterolemia Check fasting lipid panel - Lipid Panel  5. Vitamin D deficiency Check vitamin D level - Vitamin D 25 hydroxy    No orders of the defined types were placed in this encounter.  No orders of the defined types were placed in this encounter.    [name], DO [place] [phone]

## 2019-02-16 LAB — LIPID PANEL
Chol/HDL Ratio: 2.4 ratio (ref 0.0–4.4)
Cholesterol, Total: 131 mg/dL (ref 100–199)
HDL: 54 mg/dL (ref >39)
LDL Chol Calc (NIH): 58 mg/dL (ref 0–99)
Triglycerides: 102 mg/dL (ref 0–149)
VLDL Cholesterol Cal: 19 mg/dL (ref 5–40)

## 2019-02-16 LAB — VITAMIN D 25 HYDROXY (VIT D DEFICIENCY, FRACTURES): Vit D, 25-Hydroxy: 63.5 ng/mL (ref 30.0–100.0)

## 2019-04-05 ENCOUNTER — Telehealth: Payer: Self-pay | Admitting: Family Medicine

## 2019-04-05 NOTE — Telephone Encounter (Signed)
Pt has had years of insomnia, she said Dr. Laurance Flatten has tried everything. She accidentally took 2 aricept 10mg  tabs for two nights in a row, she says it was a miracle she slept all night both nights and no side effects. Can she do this on a routine basis??

## 2019-04-05 NOTE — Telephone Encounter (Signed)
Patient aware and verbalizes understanding. 

## 2019-04-05 NOTE — Telephone Encounter (Signed)
No max dose of aricept is 10mg / day

## 2019-04-26 ENCOUNTER — Other Ambulatory Visit: Payer: Self-pay | Admitting: Family Medicine

## 2019-04-28 ENCOUNTER — Other Ambulatory Visit: Payer: Self-pay | Admitting: Family Medicine

## 2019-04-29 ENCOUNTER — Other Ambulatory Visit: Payer: Self-pay | Admitting: Family Medicine

## 2019-05-01 ENCOUNTER — Other Ambulatory Visit: Payer: Self-pay | Admitting: Family Medicine

## 2019-05-06 ENCOUNTER — Ambulatory Visit (INDEPENDENT_AMBULATORY_CARE_PROVIDER_SITE_OTHER): Payer: 59 | Admitting: Family Medicine

## 2019-05-06 ENCOUNTER — Encounter: Payer: Self-pay | Admitting: Family Medicine

## 2019-05-06 ENCOUNTER — Other Ambulatory Visit: Payer: Self-pay

## 2019-05-06 VITALS — BP 143/93 | HR 59 | Temp 98.6°F | Ht 65.0 in | Wt 166.0 lb

## 2019-05-06 DIAGNOSIS — H8101 Meniere's disease, right ear: Secondary | ICD-10-CM

## 2019-05-06 DIAGNOSIS — E559 Vitamin D deficiency, unspecified: Secondary | ICD-10-CM | POA: Diagnosis not present

## 2019-05-06 MED ORDER — CLONAZEPAM 0.5 MG PO TABS: 0.5000 mg | ORAL_TABLET | Freq: Three times a day (TID) | ORAL | 3 refills | Status: DC | PRN

## 2019-05-06 MED ORDER — VITAMIN D (ERGOCALCIFEROL) 1.25 MG (50000 UNIT) PO CAPS: ORAL_CAPSULE | ORAL | 3 refills | Status: DC

## 2019-05-06 NOTE — Progress Notes (Signed)
Subjective: CC: [name]  PCP: [name], DO SFK:CLEXNT S Furrow is a 59 y.o. female presenting to clinic today for:  1.  [name] Patient with several year history of [name].  She has been treated with various medications in the past but nothing was effective except for Klonopin and Paxil.    She does report consistent use of Klonopin for [name].  She takes about 1-1/2 tablets/day but has needed up to 3 tablets/day in the past.  She also takes Paxil for this issue.  She notes that both of these medications were prescribed by her specialist and have worked really well for her.  She denies any excessive daytime sedation, falls, memory changes.  She wanted to reiterate that she has not been diagnosed with anxiety or depression in the past and that these medications are used specifically for many years only.  2.  Vitamin D deficiency/osteoporosis Patient with history of vitamin D deficiency, osteoporosis with last T score of -2.5 in May 2019.  She is treated with Fosamax, prescription vitamin D because she is unable to remember to take the OTC vitamin D daily.  She does tend to forget one or 2 doses per month but overall takes at least a couple of doses of the prescription vitamin D per month.   ROS: Per HPI  Allergies  Allergen Reactions  . Statins Other (See Comments)  . Crestor [Rosuvastatin] Other (See Comments)    myalgias  . Lipitor [Atorvastatin] Other (See Comments)    myalgias  . Temazepam Other (See Comments)    Pt does not remember (Restoril)   Past Medical History:  Diagnosis Date  . Abnormal Pap smear of cervix 2002   Dr. Eilleen Kempf   . Allergy 2012   seasonal  . Anxiety   . Arthritis   . Benign hemangioma    Dr,  Marybelle Killings   . Breast cancer ([place]) 07/20/14   Left Breast  . Cervical stenosis (uterine cervix) 2002  . Depression   . Dizziness 1991   secondary to Meniere's Miller  . Hyperlipidemia 2007  . Insomnia  1997  . Meniere's Miller 1991  . Nervousness(799.21) 1987    Current Outpatient Medications:  .  acetaminophen (TYLENOL) 325 MG tablet, Take 975 mg by mouth daily as needed for moderate pain. Usually takes twice daily in morning and bedtime and additionally during the day if needed for pain, Disp: , Rfl:  .  alendronate (FOSAMAX) 70 MG tablet, TAKE 1 TABLET ONCE A WEEK. TAKE WITH A FULL GLASS OF WATER ON AN EMPTY STOMACH., Disp: 4 tablet, Rfl: 11 .  anastrozole (ARIMIDEX) 1 MG tablet, Take 1 tablet (1 mg total) by mouth daily., Disp: 90 tablet, Rfl: 3 .  Biotin 1000 MCG tablet, Take 1,000 mcg by mouth daily., Disp: , Rfl:  .  CALCIUM PO, Take 1 tablet by mouth daily., Disp: , Rfl:  .  clonazePAM (KLONOPIN) 0.5 MG tablet, Take 1 tablet (0.5 mg total) by mouth 3 (three) times daily as needed., Disp: 90 tablet, Rfl: 3 .  donepezil (ARICEPT) 10 MG tablet, TAKE 1 TABLET BY MOUTH EVERYDAY AT BEDTIME, Disp: 31 tablet, Rfl: 0 .  Investigational aspirin/placebo 100 MG tablet [facility] [id number], Take 1 tablet by mouth daily. Take with food or a full glass of water. Do not crush enteric coated tablets., Disp: 200 tablet, Rfl: 0 .  Investigational aspirin/placebo 100 MG tablet Alliance [id number], Take 1 tablet by mouth daily. Take with food or  a full glass of water. Do not crush enteric coated tablets., Disp: 200 tablet, Rfl: 0 .  meclizine (ANTIVERT) 25 MG tablet, Take 1 tablet (25 mg total) by mouth 3 (three) times daily as needed for dizziness., Disp: 30 tablet, Rfl: 1 .  PARoxetine (PAXIL-CR) 25 MG 24 hr tablet, TAKE 1 TABLET BY MOUTH EVERY DAY, Disp: 30 tablet, Rfl: 11 .  rosuvastatin (CRESTOR) 10 MG tablet, Take 1 tablet (10 mg total) by mouth daily., Disp: 90 tablet, Rfl: 3 .  Turmeric 500 MG TABS, Take 1 tablet by mouth daily., Disp: , Rfl:  .  Vitamin D, Ergocalciferol, (DRISDOL) 1.25 MG (50000 UT) CAPS capsule, TAKE 1 CAPSULE (50,000 UNITS TOTAL) BY MOUTH EVERY 7 (SEVEN) DAYS., Disp: 12 capsule, Rfl:  3 Social History   Socioeconomic History  . Marital status: Married    Spouse name: Not on file  . Number of children: Not on file  . Years of education: Not on file  . Highest education level: Not on file  Occupational History  . Not on file  Tobacco Use  . Smoking status: Never Smoker  . Smokeless tobacco: Never Used  Substance and Sexual Activity  . Alcohol use: Yes    Comment: very rare  . Drug use: No  . Sexual activity: Yes    Birth control/protection: Surgical    Comment: ablation  Other Topics Concern  . Not on file  Social History Narrative  . Not on file   Social Determinants of Health   Financial Resource Strain:   . Difficulty of Paying Living Expenses: Not on file  Food Insecurity:   . Worried About Charity fundraiser in the Last Year: Not on file  . Ran Out of Food in the Last Year: Not on file  Transportation Needs:   . Lack of Transportation (Medical): Not on file  . Lack of Transportation (Non-Medical): Not on file  Physical Activity:   . Days of Exercise per Week: Not on file  . Minutes of Exercise per Session: Not on file  Stress:   . Feeling of Stress : Not on file  Social Connections:   . Frequency of Communication with Friends and Family: Not on file  . Frequency of Social Gatherings with Friends and Family: Not on file  . Attends Religious Services: Not on file  . Active Member of Clubs or Organizations: Not on file  . Attends Archivist Meetings: Not on file  . Marital Status: Not on file  Intimate Partner Violence:   . Fear of Current or Ex-Partner: Not on file  . Emotionally Abused: Not on file  . Physically Abused: Not on file  . Sexually Abused: Not on file   Family History  Problem Relation Age of Onset  . Cirrhosis Father   . Colon polyps Mother   . Colon cancer Neg Hx   . Esophageal cancer Neg Hx   . Rectal cancer Neg Hx   . Stomach cancer Neg Hx     Objective: Office vital signs reviewed. BP (!) 143/93    Pulse (!) 59   Temp 98.6 F (37 C) (Temporal)   Ht 5\' 5"  (1.651 m)   Wt 166 lb (75.3 kg)   SpO2 95%   BMI 27.62 kg/m   Physical Examination:  General: Awake, alert, well nourished, well appearing. No acute distress HEENT: Normal, sclera white, MMM Cardio: regular rate and rhythm, S1S2 heard, no murmurs appreciated Pulm: clear to auscultation bilaterally, no wheezes,  rhonchi or rales; normal work of breathing on room air Psych: Mood stable, speech normal, affect appropriate, pleasant and interactive.   Depression screen [facility] 2/9 05/06/2019 02/15/2019 10/15/2018 04/15/2018 11/11/2017  Decreased Interest 0 0 0 0 0  Down, Depressed, Hopeless 0 0 0 0 0  PHQ - 2 Score 0 0 0 0 0  Altered sleeping 0 2 0 - -  Tired, decreased energy 0 0 0 - -  Change in appetite 0 0 0 - -  Feeling bad or failure about yourself  0 0 0 - -  Trouble concentrating 0 0 0 - -  Moving slowly or fidgety/restless 0 0 0 - -  Suicidal thoughts 0 0 0 - -  PHQ-9 Score 0 2 0 - -  Difficult doing work/chores - Not difficult at all - - -  Some recent data might be hidden   GAD 7 : Generalized Anxiety Score 02/15/2019 10/15/2018  Nervous, Anxious, on Edge 0 1  Control/stop worrying 1 1  Worry too much - different things 1 0  Trouble relaxing 1 1  Restless 1 0  Easily annoyed or irritable 0 0  Afraid - awful might happen - 0  Total GAD 7 Score - 3  Anxiety Difficulty Not difficult at all Not difficult at all    Assessment/ Plan:  1. Vitamin D deficiency Vitamin D prescribed.  Okay to continue high-dose given forgetfulness, known osteoporosis and ongoing use of Fosamax.  2. Meniere's Miller of right ear The Narcotic Database has been reviewed.  There were no red flags.  She is due to see me in about a month and a half and therefore we will go ahead and prescribe this medication to place on hold.  She will follow-up in about 4 months. - clonazePAM (KLONOPIN) 0.5 MG tablet; Take 1 tablet (0.5 mg total) by mouth 3 (three)  times daily as needed.  Dispense: 90 tablet; Refill: 3    No orders of the defined types were placed in this encounter.  No orders of the defined types were placed in this encounter.    [name], DO [place] [phone]

## 2019-05-25 ENCOUNTER — Other Ambulatory Visit: Payer: Self-pay | Admitting: Family Medicine

## 2019-06-17 ENCOUNTER — Ambulatory Visit: Payer: 59 | Admitting: Family Medicine

## 2019-06-19 ENCOUNTER — Other Ambulatory Visit: Payer: Self-pay | Admitting: Family Medicine

## 2019-06-27 ENCOUNTER — Other Ambulatory Visit: Payer: Self-pay

## 2019-06-27 ENCOUNTER — Telehealth: Payer: Self-pay | Admitting: *Deleted

## 2019-06-27 DIAGNOSIS — Z170 Estrogen receptor positive status [ER+]: Secondary | ICD-10-CM

## 2019-06-27 DIAGNOSIS — C50412 Malignant neoplasm of upper-outer quadrant of left female breast: Secondary | ICD-10-CM

## 2019-06-27 NOTE — Progress Notes (Signed)
Patient Care Team: [name], DO as PCP - General (Family Medicine) Benson Norway, RN as Registered Nurse (Oncology) Nicholas Lose, MD as Consulting Physician (Hematology and Oncology) Coralie Keens, MD as Consulting Physician (General Surgery) Kyung Rudd, MD as Consulting Physician (Radiation Oncology) Sylvan Cheese, NP as Nurse Practitioner (Hematology and Oncology)  DIAGNOSIS:    ICD-10-CM   1. Malignant neoplasm of upper-outer quadrant of left breast in female, estrogen receptor positive ([place])  C50.412    Z17.0     SUMMARY OF ONCOLOGIC HISTORY: Oncology History  Malignant neoplasm of upper-outer quadrant of left breast in female, estrogen receptor positive ([place])  07/18/2014 Mammogram   Distortion left breast, breast density category C; U/S 1.3 x 0.8 x 0.8 cm left breast mass at 2:00 position 4 cm from nipple, no lymph nodes   07/20/2014 Initial Diagnosis   Left breast Biopsy: Invasive lobular cancer with LCIS, Grade 1, ER 86%, PR 78%, Her 2 Neg Ratio 1.77, Ki 67: 14%   08/01/2014 Breast MRI   Breast MRI showed non-mass enhancement 3.9 cm, no lymph nodes   08/01/2014 Clinical Stage   Stage IIA: T2 N0   08/08/2014 Surgery   Left breast lumpectomy: 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, with LCIS, medial and inferior margins positive, 3/4 lymph nodes positive   08/08/2014 Pathologic Stage   Stage IIB: T2 N1c M0   08/16/2014 Surgery   Left breast medial margin reexcision residual ILC 0.2 cm; inferior margin residual foci less than 0.2 cm, 1/5 lymph nodes positive, 1 lymph node with isolated tumor cells (Overall 4/10)   09/04/2014 - 01/15/2015 Chemotherapy   Adjuvant chemotherapy with dose dense Adriamycin and Cytoxan 4 followed by Abraxane weekly 8 ( stopped early for profound neutropenia and thrombocytopenia)   01/29/2015 - 03/20/2015 Radiation Therapy   Adjuvant XRT [facility]): 50.4 Gy over 28 fractions; seroma boost: 10 Gy over 5 fractions. Total  dose: 60.4 Gy   04/11/2015 -  Anti-estrogen oral therapy   Anastrozole 1 mg daily (PALLAS clinical trial 05/24/2015 patient was randomized to hormone therapy alone)   05/24/2015 Survivorship   Survivorship visit completed and copy of care plan given to patient   11/26/2016 PET scan   Small Left sided level 2 LN with hypermetabolism, post surg changes in breast and small right groin lymph node likely infectious/inflammatory, gallstones     CHIEF COMPLIANT: Follow-up of left breast cancer on anastrozole  INTERVAL HISTORY: [name] is a 59 y.o. with above-mentioned history of left breast cancer treated with lumpectomy, adjuvant chemotherapy, and radiation who is currently on oral antiestrogen therapy with anastrozole. She is a participant in the [place] and ABC clinical trials. Mammogram on 07/27/18 showed no evidence of malignancy bilaterally. She presents to the clinic today for annual follow-up.   ALLERGIES:  is allergic to statins; crestor [rosuvastatin]; lipitor [atorvastatin]; and temazepam.  MEDICATIONS:  Current Outpatient Medications  Medication Sig Dispense Refill  . acetaminophen (TYLENOL) 325 MG tablet Take 975 mg by mouth daily as needed for moderate pain. Usually takes twice daily in morning and bedtime and additionally during the day if needed for pain    . alendronate (FOSAMAX) 70 MG tablet TAKE 1 TABLET ONCE A WEEK. TAKE WITH A FULL GLASS OF WATER ON AN EMPTY STOMACH. 4 tablet 11  . anastrozole (ARIMIDEX) 1 MG tablet Take 1 tablet (1 mg total) by mouth daily. 90 tablet 3  . Biotin 1000 MCG tablet Take 1,000 mcg by mouth daily.    Marland Kitchen  CALCIUM PO Take 1 tablet by mouth daily.    . clonazePAM (KLONOPIN) 0.5 MG tablet Take 1 tablet (0.5 mg total) by mouth 3 (three) times daily as needed. 90 tablet 3  . donepezil (ARICEPT) 10 MG tablet TAKE 1 TABLET BY MOUTH EVERYDAY AT BEDTIME 31 tablet 2  . Investigational aspirin/placebo 100 MG tablet Alliance [id number] Take 1 tablet by mouth  daily. Take with food or a full glass of water. Do not crush enteric coated tablets. 200 tablet 0  . Investigational aspirin/placebo 100 MG tablet Alliance [id number] Take 1 tablet by mouth daily. Take with food or a full glass of water. Do not crush enteric coated tablets. 200 tablet 0  . meclizine (ANTIVERT) 25 MG tablet Take 1 tablet (25 mg total) by mouth 3 (three) times daily as needed for dizziness. 30 tablet 1  . PARoxetine (PAXIL-CR) 25 MG 24 hr tablet TAKE 1 TABLET BY MOUTH EVERY DAY 30 tablet 11  . rosuvastatin (CRESTOR) 10 MG tablet TAKE 1 TABLET BY MOUTH EVERY DAY 30 tablet 5  . Turmeric 500 MG TABS Take 1 tablet by mouth daily.    . Vitamin D, Ergocalciferol, (DRISDOL) 1.25 MG (50000 UNIT) CAPS capsule TAKE 1 CAPSULE (50,000 UNITS TOTAL) BY MOUTH EVERY 7 (SEVEN) DAYS. 12 capsule 3   No current facility-administered medications for this visit.    PHYSICAL EXAMINATION: ECOG PERFORMANCE STATUS: 0 - Asymptomatic  Vitals:   06/28/19 0813  BP: 136/87  Pulse: 64  Resp: 18  Temp: 98.5 F (36.9 C)  SpO2: 99%   Filed Weights   06/28/19 0813  Weight: 165 lb 1.6 oz (74.9 kg)    BREAST: No palpable masses or nodules in either right or left breasts. No palpable axillary supraclavicular or infraclavicular adenopathy no breast tenderness or nipple discharge. (exam performed in the presence of a chaperone)  LABORATORY DATA:  I have reviewed the data as listed CMP Latest Ref Rng & Units 12/28/2018 10/15/2018 07/20/2018  Glucose 70 - 99 mg/dL 100(H) 100(H) 101(H)  BUN 6 - 20 mg/dL 8 8 10   Creatinine 0.44 - 1.00 mg/dL 0.63 0.59 0.71  Sodium 135 - 145 mmol/L 140 145(H) 141  Potassium 3.5 - 5.1 mmol/L 3.7 3.8 4.2  Chloride 98 - 111 mmol/L 106 105 104  CO2 22 - 32 mmol/L 24 23 26   Calcium 8.9 - 10.3 mg/dL 8.9 8.9 9.2  Total Protein 6.5 - 8.1 g/dL 6.8 - 7.2  Total Bilirubin 0.3 - 1.2 mg/dL 0.7 - 0.6  Alkaline Phos 38 - 126 U/L 59 - 59  AST 15 - 41 U/L 19 - 20  ALT 0 - 44 U/L 24 - 29     Lab Results  Component Value Date   WBC 4.6 06/28/2019   HGB 13.2 06/28/2019   HCT 39.3 06/28/2019   MCV 96.1 06/28/2019   PLT 177 06/28/2019   NEUTROABS 2.4 06/28/2019    ASSESSMENT & PLAN:  Malignant neoplasm of upper-outer quadrant of left breast in female, estrogen receptor positive ([place]) Left breast lumpectomy 08/08/2014: 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 Adjuvant chemotherapy with dose dense Adriamycin and Cytoxan x  4 followed by Abraxane weekly 12 because of lymph node positive disease. Started 09/04/14 to 01/15/15 2. adjuvant radiation completed 03/20/15 3. Followed by antiestrogen therapy started 04/03/15 -------------------------------------------------------------------------------------------------------------------------- Anastrozole toxicities: Patient denies any hot flashes. Weight gain Arthralgias and myalgias  Bone density 07/26/2015: T score -1.9, osteopenia  PALLAS Trial : Patient has been randomized to anastrozole alone. ABC clinical trial: The trial randomizes between aspirin versus placebo. She went on trial 07/26/2015 Currently on 100 mg of dosage. Bruising has become markedly reduced.  Adverse effects: 1. Bruising/ Hematoma:  Currently taking 100 mg of aspirin/placebo: Mild 2. Osteoporosis: Fosamax weekly, recommended that she get a D[facility] scan in the next month [facility].  Cognitive dysfunction: OnAricept 10 mg daily, followed by PCP, doing much better  Weight gain:On Weight Watchers, now doing Keto diet, exercising, stable  Breast cancer surveillance: 1. Mammogram   08/05/2018: Benign 2. Breast exam4/20/2021: No palpable lumps or nodules of concern. 3.CT CAP 10/28/2017: No evidence of malignancy, radiation changes anterior left upper lobe   Her daughter is going to early [place] and she is very proud of her.  Return to clinic in 6 months for follow-up    No orders of the defined types were placed in this encounter.  The patient has a good understanding of the overall plan. she agrees with it. she will call with any problems that may develop before the next visit here.  Total time spent: 20 mins including face to face time and time spent for planning, charting and coordination of care  Nicholas Lose, MD 06/28/2019  I, Cloyde Reams Dorshimer, am acting as scribe for Dr. Nicholas Lose.  I have reviewed the above documentation for accuracy and completeness, and I agree with the above.

## 2019-06-27 NOTE — Telephone Encounter (Signed)
Called patient to remind her of scheduled appointments for 4/20 and to bring pill bottles with all remaining tablets and all diaries.

## 2019-06-28 ENCOUNTER — Inpatient Hospital Stay: Payer: 59

## 2019-06-28 ENCOUNTER — Inpatient Hospital Stay: Payer: 59 | Attending: Hematology and Oncology | Admitting: Hematology and Oncology

## 2019-06-28 ENCOUNTER — Other Ambulatory Visit: Payer: Self-pay

## 2019-06-28 ENCOUNTER — Telehealth: Payer: Self-pay | Admitting: Hematology and Oncology

## 2019-06-28 ENCOUNTER — Encounter: Payer: Self-pay | Admitting: *Deleted

## 2019-06-28 ENCOUNTER — Inpatient Hospital Stay: Payer: 59 | Admitting: *Deleted

## 2019-06-28 VITALS — BP 136/87 | HR 64 | Temp 98.5°F | Resp 18 | Ht 65.0 in | Wt 165.1 lb

## 2019-06-28 DIAGNOSIS — Z006 Encounter for examination for normal comparison and control in clinical research program: Secondary | ICD-10-CM | POA: Diagnosis present

## 2019-06-28 DIAGNOSIS — R4181 Age-related cognitive decline: Secondary | ICD-10-CM | POA: Insufficient documentation

## 2019-06-28 DIAGNOSIS — Z79811 Long term (current) use of aromatase inhibitors: Secondary | ICD-10-CM | POA: Insufficient documentation

## 2019-06-28 DIAGNOSIS — C50412 Malignant neoplasm of upper-outer quadrant of left female breast: Secondary | ICD-10-CM

## 2019-06-28 DIAGNOSIS — M810 Age-related osteoporosis without current pathological fracture: Secondary | ICD-10-CM | POA: Diagnosis not present

## 2019-06-28 DIAGNOSIS — Z170 Estrogen receptor positive status [ER+]: Secondary | ICD-10-CM

## 2019-06-28 DIAGNOSIS — Z9221 Personal history of antineoplastic chemotherapy: Secondary | ICD-10-CM | POA: Insufficient documentation

## 2019-06-28 DIAGNOSIS — M8580 Other specified disorders of bone density and structure, unspecified site: Secondary | ICD-10-CM | POA: Diagnosis not present

## 2019-06-28 DIAGNOSIS — Z923 Personal history of irradiation: Secondary | ICD-10-CM | POA: Insufficient documentation

## 2019-06-28 DIAGNOSIS — R233 Spontaneous ecchymoses: Secondary | ICD-10-CM | POA: Diagnosis not present

## 2019-06-28 DIAGNOSIS — Z79899 Other long term (current) drug therapy: Secondary | ICD-10-CM | POA: Diagnosis not present

## 2019-06-28 LAB — CMP (CANCER CENTER ONLY)
ALT: 25 U/L (ref 0–44)
AST: 21 U/L (ref 15–41)
Albumin: 4.1 g/dL (ref 3.5–5.0)
Alkaline Phosphatase: 62 U/L (ref 38–126)
Anion gap: 10 (ref 5–15)
BUN: 14 mg/dL (ref 6–20)
CO2: 25 mmol/L (ref 22–32)
Calcium: 8.8 mg/dL — ABNORMAL LOW (ref 8.9–10.3)
Chloride: 106 mmol/L (ref 98–111)
Creatinine: 0.72 mg/dL (ref 0.44–1.00)
GFR, Est AFR Am: >60 mL/min (ref >60)
GFR, Estimated: >60 mL/min (ref >60)
Glucose, Bld: 118 mg/dL — ABNORMAL HIGH (ref 70–99)
Potassium: 3.9 mmol/L (ref 3.5–5.1)
Sodium: 141 mmol/L (ref 135–145)
Total Bilirubin: 0.6 mg/dL (ref 0.3–1.2)
Total Protein: 6.7 g/dL (ref 6.5–8.1)

## 2019-06-28 LAB — CBC WITH DIFFERENTIAL (CANCER CENTER ONLY)
Abs Immature Granulocytes: 0.01 10*3/uL (ref 0.00–0.07)
Basophils Absolute: 0 10*3/uL (ref 0.0–0.1)
Basophils Relative: 1 %
Eosinophils Absolute: 0.1 10*3/uL (ref 0.0–0.5)
Eosinophils Relative: 1 %
HCT: 39.3 % (ref 36.0–46.0)
Hemoglobin: 13.2 g/dL (ref 12.0–15.0)
Immature Granulocytes: 0 %
Lymphocytes Relative: 37 %
Lymphs Abs: 1.7 10*3/uL (ref 0.7–4.0)
MCH: 32.3 pg (ref 26.0–34.0)
MCHC: 33.6 g/dL (ref 30.0–36.0)
MCV: 96.1 fL (ref 80.0–100.0)
Monocytes Absolute: 0.4 10*3/uL (ref 0.1–1.0)
Monocytes Relative: 8 %
Neutro Abs: 2.4 10*3/uL (ref 1.7–7.7)
Neutrophils Relative %: 53 %
Platelet Count: 177 10*3/uL (ref 150–400)
RBC: 4.09 MIL/uL (ref 3.87–5.11)
RDW: 12.6 % (ref 11.5–15.5)
WBC Count: 4.6 10*3/uL (ref 4.0–10.5)
nRBC: 0 % (ref 0.0–0.2)

## 2019-06-28 MED ORDER — INV-ASPIRIN/PLACEBO 100 MG TABS ALLIANCE A011502: 1.0000 | ORAL_TABLET | Freq: Every day | ORAL | 0 refills | Status: DC

## 2019-06-28 NOTE — Research (Signed)
06/28/2019 Patient seen in clinic unaccompanied today for Month 48 follow-up visits for the AFT-05 PALLAS and Alliance [id number] ABC studies.  AFT-05 PALLAS - Month 48 follow-up visit See separate research note regarding study reconsent.  Anti-cancer medications - Patient states she is continuing to take anastrozole without interruption; she is on no new anti-cancer medications.   Serious adverse events - Patient denies any new, serious health problems since her last visit.   Disease monitoring - No concerns related to disease recurrence have been raised.   COVID-19 - Patient has had no further COVID-19 testing, other than that performed in October 2020. She reports that she has received both doses of the COVID-19 vaccine and she will fax or email a copy of her vaccine card to the research nurse.   Alliance [id number] ABC - Month 48 follow-up visit  Adverse events and NSAID use: Solicited AEs were reviewed with the patient. Patient reports ongoing mild bruising (grade 1). She has had occasional episodes of hemorrhoidal bleeding (grade 1). She reports occasional nosebleeds that were mild and required no intervention (Grade 1). She denies any blood in her urine or symptoms of heartburn or gastritis. There has been no documentation of other gastrointestinal bleeding or intracranial hemorrhage. She has experienced no cardiovascular events or serious adverse events.  She denies taking any aspirin or medications containing NSAIDs, and states that she takes only Tylenol as needed for pain.  Study medication: Patient returned bottle dispensed at Month 42 visit today, which was returned to pharmacist Carolynne Edouard for proper drug accountability. Per pharmacist count, 21 tablets were remaining. One bottle of aspirin 100mg /placebo (#200) was dispensed to the patient today per protocol.   Medication Logs: Patient returned medication logs covering October 2020 through April 2021, with last dose recorded on  06/27/2019. Per completed logs, doses were taken every day, as instructed. Patient was given new copies of the medication log covering April 2021 through October 2021. She will begin with recording doses starting today.  IP compliance: 179 tablets were taken over the 182 day period from 12/28/2018 to 06/27/2019 for a compliance rate of 98%.   [name] BSN, RN, [place] 06/28/2019 9:16 AM

## 2019-06-28 NOTE — Research (Signed)
06/28/2019  AFT-05 PALLAS - Reconsent visit  During today's routine study follow-up visit, the following items were reviewed with the patient.  Participant letter, dated 29 Apr 2019: Previously mailed to patient on 05/11/2019 by research department. Patient confirms receipt of letter and understands content of letter.  Informed Consent Form Version 8, dated 17Feb2021: Changes to the ICF were reviewed with the patient including study procedure and data collection changes due to the COVID-19 pandemic, detail of patient visits in the EOT and Follow-up Phases, notification of hospitalizations and additional research blood sample collections at Years 7 and 10. A copy of the signed ICF was given to the patient today. [name] BSN, RN, [place] 06/28/2019 10:24 AM

## 2019-06-28 NOTE — Assessment & Plan Note (Signed)
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 Adjuvant chemotherapy with dose dense Adriamycin and Cytoxan x 4 followed by Abraxane weekly 12 because of lymph node positive disease. Started 09/04/14 to 01/15/15 2. adjuvant radiation completed 03/20/15 3. Followed by antiestrogen therapy started 04/03/15 -------------------------------------------------------------------------------------------------------------------------- Anastrozole toxicities: Patient denies any hot flashes. Weight gain Arthralgias and myalgias  Bone density 07/26/2015: T score -1.9, osteopenia  PALLAS Trial : Patient has been randomized to anastrozole alone. ABC clinical trial: The trial randomizes between aspirin versus placebo. She went on trial 07/26/2015 Currently on 100 mg of dosage. Bruising has become markedly reduced.  Adverse effects: 1. Bruising/ Hematoma:  Currently taking 100 mg of aspirin/placebo: No further issues 2. Osteoporosis: Fosamax weekly, D[facility]2019 [facility]; she was given a handout on bone health today.    Cognitive dysfunction: OnAricept 10 mg daily, followed by PCP, doing much better  Weight gain:On Weight Watchers, now doing Keto diet, exercising, stable  Breast cancer surveillance: 1. Mammogram  08/05/2018: Benign 2. Breast exam4/20/2021: No palpable lumps or nodules of concern. 3.CT CAP 10/28/2017: No evidence of malignancy, radiation changes anterior left upper lobe   Return to clinic in 6 months for follow-up

## 2019-06-28 NOTE — Telephone Encounter (Signed)
Scheduled per 04/20 los, patient has been called and notified of upcoming appointments.

## 2019-06-29 ENCOUNTER — Other Ambulatory Visit: Payer: Self-pay | Admitting: Hematology and Oncology

## 2019-06-29 DIAGNOSIS — C50412 Malignant neoplasm of upper-outer quadrant of left female breast: Secondary | ICD-10-CM

## 2019-06-30 NOTE — Research (Signed)
06/30/2019 [facility] [id number] ABC -Month 48 follow-up visit  Confirmed with patient that one tablet (100mg  aspirin or placebo) was taken daily for the dates on the April Medication Log showing a time recorded (April 1-19, all times are PM, per patient).  [name] BSN, RN, [place] 06/30/2019 2:54 PM

## 2019-07-01 NOTE — Research (Signed)
07/01/2019   Adverse Event Log  Alliance [id number] Month 48 12/28/2018 - 06/28/2019 Baseline AEs (grade): dizziness, int. (1); anxiety (2); joint pain, int. (2); seasonal allergies (1) Event Grade Attribution to aspirin/placebo Comments  Bruising 1 Probable Occasional bruising, none present on exam.  Weight gain 2 Possible    Nosebleeds 1 Probable    Non-reportable grade 1 events: - Hyperglycemia - Hypocalcemia - Hemorrhoidal bleeding - Hypertension - LUE edema  [name] BSN, RN, [place] 07/01/2019 12:07 PM

## 2019-09-01 ENCOUNTER — Other Ambulatory Visit: Payer: Self-pay | Admitting: Family Medicine

## 2019-09-05 ENCOUNTER — Encounter: Payer: Self-pay | Admitting: Family Medicine

## 2019-09-05 ENCOUNTER — Ambulatory Visit (INDEPENDENT_AMBULATORY_CARE_PROVIDER_SITE_OTHER): Payer: PRIVATE HEALTH INSURANCE | Admitting: Family Medicine

## 2019-09-05 ENCOUNTER — Other Ambulatory Visit: Payer: Self-pay

## 2019-09-05 VITALS — BP 129/82 | HR 69 | Temp 97.7°F | Ht 65.0 in | Wt 167.6 lb

## 2019-09-05 DIAGNOSIS — M818 Other osteoporosis without current pathological fracture: Secondary | ICD-10-CM | POA: Diagnosis not present

## 2019-09-05 DIAGNOSIS — H8101 Meniere's disease, right ear: Secondary | ICD-10-CM | POA: Diagnosis not present

## 2019-09-05 DIAGNOSIS — F418 Other specified anxiety disorders: Secondary | ICD-10-CM | POA: Diagnosis not present

## 2019-09-05 MED ORDER — CLONAZEPAM 0.5 MG PO TABS: 0.5000 mg | ORAL_TABLET | Freq: Three times a day (TID) | ORAL | 5 refills | Status: DC | PRN

## 2019-09-05 NOTE — Progress Notes (Signed)
Subjective: CC: [name]  PCP: [name], DO TDH:RCBULA S Schmiesing is a 59 y.o. female presenting to clinic today for:  1.  [name] Patient with several year history of [name].  She has been treated with various medications in the past but nothing was effective except for Klonopin and Paxil.    Patient reports stability of Jade.  She is had no flares.  She did have some bilateral ear fullness about a week ago and she would like me to check her ears out.  She continues to use about 1.5 tablets of the Klonopin daily but keeps extras on hand just in case she has a flare.  Denies any excessive daytime sleepiness, falls, changes in mental status including memory loss or visual hallucinations.  2.  Vitamin D deficiency/osteoporosis History: vitamin D deficiency, osteoporosis with last T score improved from -2.5 to -2.1 in 07/2019.  Compliant with Fosamax, prescription vitamin D weekly.   She had a DEXA scan done on the 26th and wanted me to go over that result with her.     ROS: Per HPI  Allergies  Allergen Reactions  . Statins Other (See Comments)  . Crestor [Rosuvastatin] Other (See Comments)    myalgias  . Lipitor [Atorvastatin] Other (See Comments)    myalgias  . Temazepam Other (See Comments)    Pt does not remember (Restoril)   Past Medical History:  Diagnosis Date  . Abnormal Pap smear of cervix 2002   Dr. Eilleen Kempf   . Allergy 2012   seasonal  . Anxiety   . Arthritis   . Benign hemangioma    Dr,  Marybelle Killings   . Breast cancer ([place]) 07/20/14   Left Breast  . Cervical stenosis (uterine cervix) 2002  . Depression   . Dizziness 1991   secondary to Meniere's Miller  . Hyperlipidemia 2007  . Insomnia 1997  . Meniere's Miller 1991  . Nervousness(799.21) 1987    Current Outpatient Medications:  .  acetaminophen (TYLENOL) 325 MG tablet, Take 975 mg by mouth daily as needed for moderate pain. Usually takes twice daily in morning  and bedtime and additionally during the day if needed for pain, Disp: , Rfl:  .  alendronate (FOSAMAX) 70 MG tablet, TAKE 1 TABLET ONCE A WEEK. TAKE WITH A FULL GLASS OF WATER ON AN EMPTY STOMACH., Disp: 4 tablet, Rfl: 11 .  anastrozole (ARIMIDEX) 1 MG tablet, TAKE 1 TABLET BY MOUTH EVERY DAY, Disp: 30 tablet, Rfl: 11 .  Biotin 1000 MCG tablet, Take 1,000 mcg by mouth daily., Disp: , Rfl:  .  CALCIUM PO, Take 1 tablet by mouth daily., Disp: , Rfl:  .  clonazePAM (KLONOPIN) 0.5 MG tablet, Take 1 tablet (0.5 mg total) by mouth 3 (three) times daily as needed., Disp: 90 tablet, Rfl: 3 .  donepezil (ARICEPT) 10 MG tablet, TAKE 1 TABLET BY MOUTH EVERYDAY AT BEDTIME, Disp: 31 tablet, Rfl: 2 .  Investigational aspirin/placebo 100 MG tablet Alliance [id number], Take 1 tablet by mouth daily. Take with food or a full glass of water. Do not crush enteric coated tablets., Disp: 200 tablet, Rfl: 0 .  meclizine (ANTIVERT) 25 MG tablet, Take 1 tablet (25 mg total) by mouth 3 (three) times daily as needed for dizziness., Disp: 30 tablet, Rfl: 1 .  PARoxetine (PAXIL-CR) 25 MG 24 hr tablet, TAKE 1 TABLET BY MOUTH EVERY DAY, Disp: 30 tablet, Rfl: 11 .  rosuvastatin (CRESTOR) 10 MG tablet, TAKE 1 TABLET BY  MOUTH EVERY DAY, Disp: 30 tablet, Rfl: 5 .  Turmeric 500 MG TABS, Take 1 tablet by mouth daily., Disp: , Rfl:  .  Vitamin D, Ergocalciferol, (DRISDOL) 1.25 MG (50000 UNIT) CAPS capsule, TAKE 1 CAPSULE (50,000 UNITS TOTAL) BY MOUTH EVERY 7 (SEVEN) DAYS., Disp: 12 capsule, Rfl: 3 Social History   Socioeconomic History  . Marital status: Married    Spouse name: Not on file  . Number of children: Not on file  . Years of education: Not on file  . Highest education level: Not on file  Occupational History  . Not on file  Tobacco Use  . Smoking status: Never Smoker  . Smokeless tobacco: Never Used  Vaping Use  . Vaping Use: Never used  Substance and Sexual Activity  . Alcohol use: Yes    Comment: very rare  .  Drug use: No  . Sexual activity: Yes    Birth control/protection: Surgical    Comment: ablation  Other Topics Concern  . Not on file  Social History Narrative  . Not on file   Social Determinants of Health   Financial Resource Strain:   . Difficulty of Paying Living Expenses:   Food Insecurity:   . Worried About Charity fundraiser in the Last Year:   . Arboriculturist in the Last Year:   Transportation Needs:   . Film/video editor (Medical):   Marland Kitchen Lack of Transportation (Non-Medical):   Physical Activity:   . Days of Exercise per Week:   . Minutes of Exercise per Session:   Stress:   . Feeling of Stress :   Social Connections:   . Frequency of Communication with Friends and Family:   . Frequency of Social Gatherings with Friends and Family:   . Attends Religious Services:   . Active Member of Clubs or Organizations:   . Attends Archivist Meetings:   Marland Kitchen Marital Status:   Intimate Partner Violence:   . Fear of Current or Ex-Partner:   . Emotionally Abused:   Marland Kitchen Physically Abused:   . Sexually Abused:    Family History  Problem Relation Age of Onset  . Cirrhosis Father   . Colon polyps Mother   . Colon cancer Neg Hx   . Esophageal cancer Neg Hx   . Rectal cancer Neg Hx   . Stomach cancer Neg Hx     Objective: Office vital signs reviewed. BP 129/82   Pulse 69   Temp 97.7 F (36.5 C)   Ht 5\' 5"  (1.651 m)   Wt 167 lb 9.6 oz (76 kg)   SpO2 95%   BMI 27.89 kg/m   Physical Examination:  General: Awake, alert, well nourished, well appearing. No acute distress HEENT: Normal, sclera white, MMM; bilateral TMs with mild clear effusion noted.  No evidence of secondary infection.  No bulging.  No perforation. Cardio: regular rate and rhythm, S1S2 heard, no murmurs appreciated Pulm: clear to auscultation bilaterally, no wheezes, rhonchi or rales; normal work of breathing on room air Psych: Mood stable, speech normal, affect appropriate, pleasant and  interactive.   Depression screen [facility] 2/9 09/05/2019 05/06/2019 02/15/2019 10/15/2018 04/15/2018  Decreased Interest 0 0 0 0 0  Down, Depressed, Hopeless 0 0 0 0 0  PHQ - 2 Score 0 0 0 0 0  Altered sleeping - 0 2 0 -  Tired, decreased energy - 0 0 0 -  Change in appetite - 0 0 0 -  Feeling bad  or failure about yourself  - 0 0 0 -  Trouble concentrating - 0 0 0 -  Moving slowly or fidgety/restless - 0 0 0 -  Suicidal thoughts - 0 0 0 -  PHQ-9 Score - 0 2 0 -  Difficult doing work/chores - - Not difficult at all - -  Some recent data might be hidden    Assessment/ Plan:  1. Meniere's Miller of right ear Stable.  The national narcotic database was reviewed and there were no red flags.  Medication has been renewed.  She will follow-up in 6 months, sooner if needed.  Plan for UDS and CSC at that visit as her current one expires in August - clonazePAM (KLONOPIN) 0.5 MG tablet; Take 1 tablet (0.5 mg total) by mouth 3 (three) times daily as needed.  Dispense: 90 tablet; Refill: 5  2. Anxiety with depression Stable.  Continue Paxil  3. Other osteoporosis without current pathological fracture Improving with statistical increase in bone density on most recent DEXA scan.  Continue Fosamax, vitamin D.  Plan for repeat in 2 years.     No orders of the defined types were placed in this encounter.  No orders of the defined types were placed in this encounter.    [name], DO [place] [phone]

## 2019-09-05 NOTE — Patient Instructions (Signed)
DEXA showed improvement in bone density.  T score -2.1.  Lindi Adie got labs recently. No need to repeat.

## 2019-11-10 ENCOUNTER — Other Ambulatory Visit: Payer: Self-pay | Admitting: Hematology and Oncology

## 2019-11-10 MED ORDER — FUROSEMIDE 20 MG PO TABS: 20.0000 mg | ORAL_TABLET | Freq: Every day | ORAL | 3 refills | Status: DC

## 2019-11-10 MED ORDER — AMOXICILLIN-POT CLAVULANATE 875-125 MG PO TABS: 1.0000 | ORAL_TABLET | Freq: Two times a day (BID) | ORAL | 0 refills | Status: AC

## 2019-11-10 MED ORDER — POTASSIUM CHLORIDE ER 10 MEQ PO TBCR: 10.0000 meq | EXTENDED_RELEASE_TABLET | Freq: Every day | ORAL | 3 refills | Status: DC

## 2019-12-26 ENCOUNTER — Ambulatory Visit: Payer: PRIVATE HEALTH INSURANCE | Admitting: Family Medicine

## 2019-12-26 ENCOUNTER — Other Ambulatory Visit: Payer: Self-pay | Admitting: *Deleted

## 2019-12-26 ENCOUNTER — Telehealth: Payer: Self-pay | Admitting: Medical Oncology

## 2019-12-26 DIAGNOSIS — C50412 Malignant neoplasm of upper-outer quadrant of left female breast: Secondary | ICD-10-CM

## 2019-12-26 DIAGNOSIS — Z170 Estrogen receptor positive status [ER+]: Secondary | ICD-10-CM

## 2019-12-26 NOTE — Telephone Encounter (Signed)
Research: ABC + PALLAS study visits Call to patient to introduce myself and inform her that I will be completing her study visits this week as well as will be following her on study going forward. Patient inquired about her previous research nurse Tyrell Antonio, and patient was informed that Jenny Reichmann is no longer in research. Patient gave her verbal understanding.  Patient was reminded to bring her medication diaries and Aspirin/placebo bottle that was dispensed at her last visit.  Patient requested that a Lipid panel be drawn, as well as a fasting glucose or Hba1c and a tobacco test. Patient states that Dr. Lindi Adie would know what a tobacco test is. Patient knows that she will need to fast at least eight hours for these labs.  I informed patient that I will send a message to Dr. Lindi Adie so that he is aware to order these labs. Patient thanked for her time and encouraged to call with questions in the mean time.  MD inboxed.  Maxwell Marion, RN, BSN, [facility] Clinical Research 12/26/2019 2:02 PM

## 2019-12-27 ENCOUNTER — Other Ambulatory Visit: Payer: Self-pay | Admitting: *Deleted

## 2019-12-27 DIAGNOSIS — Z170 Estrogen receptor positive status [ER+]: Secondary | ICD-10-CM

## 2019-12-27 DIAGNOSIS — C50412 Malignant neoplasm of upper-outer quadrant of left female breast: Secondary | ICD-10-CM

## 2019-12-27 NOTE — Progress Notes (Signed)
Patient Care Team: Jade Norlander, DO as PCP - General (Family Medicine) Jade Lose, MD as Consulting Physician (Hematology and Oncology) Jade Keens, MD as Consulting Physician (General Surgery) Jade Rudd, MD as Consulting Physician (Radiation Oncology) Jade Cheese, NP as Nurse Practitioner (Hematology and Oncology)  DIAGNOSIS:    ICD-10-CM   1. Malignant neoplasm of upper-outer quadrant of left breast in female, estrogen receptor positive ([place])  C50.412    Z17.0     SUMMARY OF ONCOLOGIC HISTORY: Oncology History  Malignant neoplasm of upper-outer quadrant of left breast in female, estrogen receptor positive ([place])  07/18/2014 Mammogram   Distortion left breast, breast density category C; U/S 1.3 x 0.8 x 0.8 cm left breast mass at 2:00 position 4 cm from nipple, no lymph nodes   07/20/2014 Initial Diagnosis   Left breast Biopsy: Invasive lobular cancer with LCIS, Grade 1, ER 86%, PR 78%, Her 2 Neg Ratio 1.77, Ki 67: 14%   08/01/2014 Breast MRI   Breast MRI showed non-mass enhancement 3.9 cm, no lymph nodes   08/01/2014 Clinical Stage   Stage IIA: T2 N0   08/08/2014 Surgery   Left breast lumpectomy: 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, with LCIS, medial and inferior margins positive, 3/4 lymph nodes positive   08/08/2014 Pathologic Stage   Stage IIB: T2 N1c M0   08/16/2014 Surgery   Left breast medial margin reexcision residual ILC 0.2 cm; inferior margin residual foci less than 0.2 cm, 1/5 lymph nodes positive, 1 lymph node with isolated tumor cells (Overall 4/10)   09/04/2014 - 01/15/2015 Chemotherapy   Adjuvant chemotherapy with dose dense Adriamycin and Cytoxan 4 followed by Abraxane weekly 8 ( stopped early for profound neutropenia and thrombocytopenia)   01/29/2015 - 03/20/2015 Radiation Therapy   Adjuvant XRT [facility]): 50.4 Gy over 28 fractions; seroma boost: 10 Gy over 5 fractions. Total dose: 60.4 Gy   04/11/2015 -  Anti-estrogen oral  therapy   Anastrozole 1 mg daily (PALLAS clinical trial 05/24/2015 patient was randomized to hormone therapy alone)   05/24/2015 Survivorship   Survivorship visit completed and copy of care plan given to patient   11/26/2016 PET scan   Small Left sided level 2 LN with hypermetabolism, post surg changes in breast and small right groin lymph node likely infectious/inflammatory, gallstones     CHIEF COMPLIANT: Follow-up of left breast cancer on anastrozole  INTERVAL HISTORY: [name] is a 59 y.o. with above-mentioned history of left breast cancertreated with lumpectomy, adjuvant chemotherapy, and radiation who iscurrently on oral antiestrogen therapy with anastrozole.She is a participant in the [place] trials. Mammogram on 07/28/19 showed no evidence of malignancy bilaterally. Bone density scan on 07/28/19 showed osteopenia with a T-score of -2.1 at the right femoral neck. She presents to the clinic today for annual follow-up.   ALLERGIES:  is allergic to statins, crestor [rosuvastatin], lipitor [atorvastatin], and temazepam.  MEDICATIONS:  Current Outpatient Medications  Medication Sig Dispense Refill  . acetaminophen (TYLENOL) 325 MG tablet Take 975 mg by mouth daily as needed for moderate pain. Usually takes twice daily in morning and bedtime and additionally during the day if needed for pain    . alendronate (FOSAMAX) 70 MG tablet TAKE 1 TABLET ONCE A WEEK. TAKE WITH A FULL GLASS OF WATER ON AN EMPTY STOMACH. 4 tablet 11  . anastrozole (ARIMIDEX) 1 MG tablet TAKE 1 TABLET BY MOUTH EVERY DAY 30 tablet 11  . Biotin 1000 MCG tablet Take 1,000 mcg by  mouth daily.    Marland Kitchen CALCIUM PO Take 1 tablet by mouth daily.    . clonazePAM (KLONOPIN) 0.5 MG tablet Take 1 tablet (0.5 mg total) by mouth 3 (three) times daily as needed. 90 tablet 5  . donepezil (ARICEPT) 10 MG tablet TAKE 1 TABLET BY MOUTH EVERYDAY AT BEDTIME 31 tablet 2  . furosemide (LASIX) 20 MG tablet Take 1 tablet  (20 mg total) by mouth daily. 30 tablet 3  . Investigational aspirin/placebo 100 MG tablet Alliance [id number] Take 1 tablet by mouth daily. Take with food or a full glass of water. Do not crush enteric coated tablets. 200 tablet 0  . meclizine (ANTIVERT) 25 MG tablet Take 1 tablet (25 mg total) by mouth 3 (three) times daily as needed for dizziness. 30 tablet 1  . PARoxetine (PAXIL-CR) 25 MG 24 hr tablet TAKE 1 TABLET BY MOUTH EVERY DAY 30 tablet 11  . potassium chloride (KLOR-CON) 10 MEQ tablet Take 1 tablet (10 mEq total) by mouth daily. 30 tablet 3  . rosuvastatin (CRESTOR) 10 MG tablet TAKE 1 TABLET BY MOUTH EVERY DAY 30 tablet 5  . Turmeric 500 MG TABS Take 1 tablet by mouth daily.    . Vitamin D, Ergocalciferol, (DRISDOL) 1.25 MG (50000 UNIT) CAPS capsule TAKE 1 CAPSULE (50,000 UNITS TOTAL) BY MOUTH EVERY 7 (SEVEN) DAYS. 12 capsule 3   No current facility-administered medications for this visit.    PHYSICAL EXAMINATION: ECOG PERFORMANCE STATUS: 1 - Symptomatic but completely ambulatory  Vitals:   12/28/19 0819  BP: 122/71  Pulse: 66  Resp: 18  Temp: (!) 97.1 F (36.2 C)  SpO2: 98%   Filed Weights   12/28/19 0819  Weight: 168 lb 8 oz (76.4 kg)    BREAST: No palpable masses or nodules in either right or left breasts. No palpable axillary supraclavicular or infraclavicular adenopathy no breast tenderness or nipple discharge. (exam performed in the presence of a chaperone)  LABORATORY DATA:  I have reviewed the data as listed CMP Latest Ref Rng & Units 12/28/2019 06/28/2019 12/28/2018  Glucose 70 - 99 mg/dL 101(H) 118(H) 100(H)  BUN 6 - 20 mg/dL 15 14 8   Creatinine 0.44 - 1.00 mg/dL 0.66 0.72 0.63  Sodium 135 - 145 mmol/L 141 141 140  Potassium 3.5 - 5.1 mmol/L 3.5 3.9 3.7  Chloride 98 - 111 mmol/L 106 106 106  CO2 22 - 32 mmol/L 28 25 24   Calcium 8.9 - 10.3 mg/dL 9.3 8.8(L) 8.9  Total Protein 6.5 - 8.1 g/dL 6.9 6.7 6.8  Total Bilirubin 0.3 - 1.2 mg/dL 0.8 0.6 0.7  Alkaline  Phos 38 - 126 U/L 64 62 59  AST 15 - 41 U/L 18 21 19   ALT 0 - 44 U/L 21 25 24     Lab Results  Component Value Date   WBC 5.2 12/28/2019   HGB 13.9 12/28/2019   HCT 40.4 12/28/2019   MCV 93.5 12/28/2019   PLT 196 12/28/2019   NEUTROABS 2.8 12/28/2019    ASSESSMENT & PLAN:  Malignant neoplasm of upper-outer quadrant of left breast in female, estrogen receptor positive ([place]) Left breast lumpectomy 08/08/2014: 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, with LCIS, medial and inferior margins positive, 3/4 lymph nodes positive T2 N1 cM0 stage IIB, ER 86%, PR 78%, HER-2/neu negative ratio 1.77, Ki-67 14% Left breast medial margin reexcision 08/18/14: residual ILC 0.2 cm; inferior margin residual foci less than 0.2 cm, 1/5 lymph nodes positive, 1 lymph node with isolated tumor  cells (Overall 4/10)  Treatment Summary: 1. Adjuvant chemotherapy with dose dense Adriamycin and Cytoxan x 4 followed by Abraxane weekly 12 because of lymph node positive disease. Started 09/04/14 to 01/15/15 2. adjuvant radiation completed 03/20/15 3. Followed by antiestrogen therapy started 04/03/15 -------------------------------------------------------------------------------------------------------------------------- Anastrozole toxicities: Patient denies any hot flashes. Weight gain Arthralgias and myalgias    PALLAS Trial : Patient has been randomized to anastrozole alone. ABC clinical trial: The trial randomizes between aspirin versus placebo. She went on trial 07/26/2015 Currently on 100 mg of dosage.  No adverse effects from participating in the trial.  Adverse effects: 1. Bruising/ Hematoma:  Currently taking100 mg of aspirin/placebo: Mild 2. Osteoporosis: Fosamax weekly, recommended that she get a DEXA scan in the next month at [facility].  Cognitive dysfunction: OnAricept 10 mg daily, followed by PCP, doing much better  Weight gain:On Weight Watchers, now doing Keto diet, exercising,  stable  Breast cancer surveillance: 1. Mammogram 07/28/2019 Benign 2. Breast exam10/20/21: No palpable lumps or nodules of concern. 3.CT CAP 10/28/2017: No evidence of malignancy, radiation changes anterior left upper lobe  Bone density 07/26/2015: T score -1.9, osteopenia Bone density: 07/28/19 T score -2.1 currently on Fosamax  Her daughter is going to early [place] and she is very proud of her. No clinical evidence of breast cancer recurrence. Return to clinic in 6 months for follow-up    No orders of the defined types were placed in this encounter.  The patient has a good understanding of the overall plan. she agrees with it. she will call with any problems that may develop before the next visit here.  Total time spent: 20 mins including face to face time and time spent for planning, charting and coordination of care  Jade Lose, MD 12/28/2019  I, Cloyde Reams Dorshimer, am acting as scribe for Dr. Nicholas Miller.  I have reviewed the above documentation for accuracy and completeness, and I agree with the above.

## 2019-12-28 ENCOUNTER — Encounter: Payer: Self-pay | Admitting: Medical Oncology

## 2019-12-28 ENCOUNTER — Inpatient Hospital Stay: Payer: PRIVATE HEALTH INSURANCE | Attending: Hematology and Oncology

## 2019-12-28 ENCOUNTER — Ambulatory Visit: Payer: 59 | Admitting: Hematology and Oncology

## 2019-12-28 ENCOUNTER — Inpatient Hospital Stay (HOSPITAL_BASED_OUTPATIENT_CLINIC_OR_DEPARTMENT_OTHER): Payer: PRIVATE HEALTH INSURANCE | Admitting: Hematology and Oncology

## 2019-12-28 ENCOUNTER — Other Ambulatory Visit: Payer: 59

## 2019-12-28 ENCOUNTER — Other Ambulatory Visit: Payer: Self-pay

## 2019-12-28 DIAGNOSIS — Z9221 Personal history of antineoplastic chemotherapy: Secondary | ICD-10-CM | POA: Diagnosis not present

## 2019-12-28 DIAGNOSIS — R635 Abnormal weight gain: Secondary | ICD-10-CM | POA: Insufficient documentation

## 2019-12-28 DIAGNOSIS — Z170 Estrogen receptor positive status [ER+]: Secondary | ICD-10-CM | POA: Diagnosis not present

## 2019-12-28 DIAGNOSIS — Z79899 Other long term (current) drug therapy: Secondary | ICD-10-CM | POA: Insufficient documentation

## 2019-12-28 DIAGNOSIS — Z923 Personal history of irradiation: Secondary | ICD-10-CM | POA: Insufficient documentation

## 2019-12-28 DIAGNOSIS — Z79811 Long term (current) use of aromatase inhibitors: Secondary | ICD-10-CM | POA: Insufficient documentation

## 2019-12-28 DIAGNOSIS — R4189 Other symptoms and signs involving cognitive functions and awareness: Secondary | ICD-10-CM | POA: Insufficient documentation

## 2019-12-28 DIAGNOSIS — M791 Myalgia, unspecified site: Secondary | ICD-10-CM | POA: Diagnosis not present

## 2019-12-28 DIAGNOSIS — M85851 Other specified disorders of bone density and structure, right thigh: Secondary | ICD-10-CM | POA: Insufficient documentation

## 2019-12-28 DIAGNOSIS — C50412 Malignant neoplasm of upper-outer quadrant of left female breast: Secondary | ICD-10-CM

## 2019-12-28 DIAGNOSIS — Z888 Allergy status to other drugs, medicaments and biological substances status: Secondary | ICD-10-CM | POA: Diagnosis not present

## 2019-12-28 DIAGNOSIS — M2550 Pain in unspecified joint: Secondary | ICD-10-CM | POA: Diagnosis not present

## 2019-12-28 LAB — LIPID PANEL
Cholesterol: 153 mg/dL (ref 0–200)
HDL: 50 mg/dL (ref >40)
LDL Cholesterol: 78 mg/dL (ref 0–99)
Total CHOL/HDL Ratio: 3.1 RATIO
Triglycerides: 127 mg/dL (ref <150)
VLDL: 25 mg/dL (ref 0–40)

## 2019-12-28 LAB — CMP (CANCER CENTER ONLY)
ALT: 21 U/L (ref 0–44)
AST: 18 U/L (ref 15–41)
Albumin: 4.1 g/dL (ref 3.5–5.0)
Alkaline Phosphatase: 64 U/L (ref 38–126)
Anion gap: 7 (ref 5–15)
BUN: 15 mg/dL (ref 6–20)
CO2: 28 mmol/L (ref 22–32)
Calcium: 9.3 mg/dL (ref 8.9–10.3)
Chloride: 106 mmol/L (ref 98–111)
Creatinine: 0.66 mg/dL (ref 0.44–1.00)
GFR, Estimated: >60 mL/min (ref >60)
Glucose, Bld: 101 mg/dL — ABNORMAL HIGH (ref 70–99)
Potassium: 3.5 mmol/L (ref 3.5–5.1)
Sodium: 141 mmol/L (ref 135–145)
Total Bilirubin: 0.8 mg/dL (ref 0.3–1.2)
Total Protein: 6.9 g/dL (ref 6.5–8.1)

## 2019-12-28 LAB — CBC WITH DIFFERENTIAL (CANCER CENTER ONLY)
Abs Immature Granulocytes: 0.01 10*3/uL (ref 0.00–0.07)
Basophils Absolute: 0 10*3/uL (ref 0.0–0.1)
Basophils Relative: 1 %
Eosinophils Absolute: 0.1 10*3/uL (ref 0.0–0.5)
Eosinophils Relative: 1 %
HCT: 40.4 % (ref 36.0–46.0)
Hemoglobin: 13.9 g/dL (ref 12.0–15.0)
Immature Granulocytes: 0 %
Lymphocytes Relative: 36 %
Lymphs Abs: 1.9 10*3/uL (ref 0.7–4.0)
MCH: 32.2 pg (ref 26.0–34.0)
MCHC: 34.4 g/dL (ref 30.0–36.0)
MCV: 93.5 fL (ref 80.0–100.0)
Monocytes Absolute: 0.5 10*3/uL (ref 0.1–1.0)
Monocytes Relative: 9 %
Neutro Abs: 2.8 10*3/uL (ref 1.7–7.7)
Neutrophils Relative %: 53 %
Platelet Count: 196 10*3/uL (ref 150–400)
RBC: 4.32 MIL/uL (ref 3.87–5.11)
RDW: 12.5 % (ref 11.5–15.5)
WBC Count: 5.2 10*3/uL (ref 4.0–10.5)
nRBC: 0 % (ref 0.0–0.2)

## 2019-12-28 MED ORDER — INV-ASPIRIN/PLACEBO 100 MG TABS ALLIANCE A011502: 1.0000 | ORAL_TABLET | Freq: Every day | ORAL | 0 refills | Status: DC

## 2019-12-28 NOTE — Progress Notes (Signed)
[id number]: A RANDOMIZED PHASE III DOUBLE BLINDED PLACEBO CONTROLLED TRIAL OF ASPIRIN AS ADJUVANT THERAPY FOR HER2 NEGATIVE BREAST CANCER: THE ABC TRIAL  Patient presents alone to the clinic today for her 1 month on-study visit and H&P with Dr. Lindi Adie. Patient's vital signs collected and documented. I met with patient after her scheduled lab appointment.     Concomitant Medications - Patient confirms no non-protocol use of an NSAID.    Investigational Medication - Patient returned her previously dispensed study medication bottle. This bottle (Rx [id number]) was taken to pharmacy for documentation of return and count. Count verified by PharmD Raul Del of 26 tablets returned. Patient was dispensed a new #200 pills study medication Aspirin/placebo bottle (Rx [id number] exp 12/07/21, 100 mg) and was to start it today.    Aspirin/Placebo Medication Log: Patient provided me today with her medication log with start date of 06/28/2019 through 12/27/2019. Patient reports and confirms no missed doses. With patient's return today of 26 pills (from previous dispensed bottle of #200), per count, and per patient's medication log, visits  of 06/28/2019- 12/27/2019 = 183 days, patient reporting a total of  174 aspirin/placebo taken and 0 missed days, per patient's med log and pill count, patient has demonstrated at least 95% compliance.    Adverse Events and Solicited AEs:  Patient denies any changes in her health.  She confirms she is tolerating the study drug well. She denies experiencing any of the solicited AEs: ICH, GI bleed, epistaxis, hematuria, dyspepsia, gastritis or bruising. No other adverse events reported.    H&P with ECOG and Disease Assessment:  Patient seen by Dr. Lindi Adie today who addressed all of patient's questions/concerns. Per MD patient to continue with her current study dose.    Plan/Visit Scheduling - Patient was thanked today for her time and ongoing support of study. Patient was informed that this  is her last study medication dispense and at her next visit she will have completed 5 years. Next study visit for her End of Protocol treatment will be planned for approximately April 18th of 2022. All patient's questions answered to her satisfaction and patient was encouraged to call Dr. Lindi Adie or myself with any questions/concerns she may have. My contact information was provided to patient.  Adverse Event Log  [facility] [phone] Month4810/20/2020 - 06/28/2019 Baseline AEs (grade): dizziness, int. (1); anxiety (2);joint pain, int. (2);seasonal allergies(1)  Event Grade Attribution to aspirin/placebo Comments  Weight gain 2 Possible stable     Maxwell Marion, RN, BSN, [facility] Clinical Research 12/28/2019 10:08 AM

## 2019-12-28 NOTE — Assessment & Plan Note (Signed)
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 Adjuvant chemotherapy with dose dense Adriamycin and Cytoxan x 4 followed by Abraxane weekly 12 because of lymph node positive disease. Started 09/04/14 to 01/15/15 2. adjuvant radiation completed 03/20/15 3. Followed by antiestrogen therapy started 04/03/15 -------------------------------------------------------------------------------------------------------------------------- Anastrozole toxicities: Patient denies any hot flashes. Weight gain Arthralgias and myalgias  Bone density 07/26/2015: T score -1.9, osteopenia  PALLAS Trial : Patient has been randomized to anastrozole alone. ABC clinical trial: The trial randomizes between aspirin versus placebo. She went on trial 07/26/2015 Currently on 100 mg of dosage. Bruising has become markedly reduced.  Adverse effects: 1. Bruising/ Hematoma:  Currently taking100 mg of aspirin/placebo: Mild 2. Osteoporosis: Fosamax weekly, recommended that she get a DEXA scan in the next month at [facility].  Cognitive dysfunction: OnAricept 10 mg daily, followed by PCP, doing much better  Weight gain:On Weight Watchers, now doing Keto diet, exercising, stable  Breast cancer surveillance: 1. Mammogram 07/28/2019 Benign 2. Breast exam10/20/21: No palpable lumps or nodules of concern. 3.CT CAP 10/28/2017: No evidence of malignancy, radiation changes anterior left upper lobe Bone density: 07/28/19 T score -2.1  Her daughter is going to early [employer] and she is very proud of her.  Return to  clinic in 6 months for follow-up

## 2019-12-28 NOTE — Progress Notes (Signed)
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  54 Month follow-up Treatment Arm B: clinic visit  Patient presents alone to her appointment today. I met with patient after her scheduled lab appointment and prior to her routine follow up appointment with Dr. Lindi Adie. Patient requested to have routine labs drawn today. Patient reports to be doing well and denies having any issues or complaints and with no SAE's. Patient confirms taking anastrozole as instructed and no new anti-cancer medications.  Dr. Lindi Adie addressed patient's questions/concerns.  Yearly mammogram was completed on 07/28/2019.  Plan: Patient was informed she will be scheduled for month 60 follow-up with study labs to be drawn as well. Informed patient next appointment will be in April 2022 and I will call her to schedule this. Patient denies having any questions at this time. I thanked patient for her time today and continued support of study and encouraged her to contact Dr. Lindi Adie or myself with questions or concerns.  Patient was provided with my direct office contact information.  Maxwell Marion, RN, BSN, [facility] Clinical Research 12/28/2019 12:18 PM

## 2019-12-29 LAB — HEMOGLOBIN A1C
Hgb A1c MFr Bld: 5.5 % (ref 4.8–5.6)
Mean Plasma Glucose: 111 mg/dL

## 2019-12-30 ENCOUNTER — Telehealth: Payer: Self-pay | Admitting: Hematology and Oncology

## 2019-12-30 NOTE — Telephone Encounter (Signed)
Rescheduled appointments per 10/22 sch msg. Spoke to patient who is aware of appointment reschedule.

## 2020-01-02 LAB — NICOTINE AND METABS, URINE
Cotinine, Urine: <10 ng/mL
Nicotine, urine: <10 ng/mL

## 2020-01-03 ENCOUNTER — Other Ambulatory Visit: Payer: Self-pay | Admitting: Family Medicine

## 2020-01-11 ENCOUNTER — Ambulatory Visit: Payer: PRIVATE HEALTH INSURANCE | Admitting: Family Medicine

## 2020-01-25 ENCOUNTER — Other Ambulatory Visit: Payer: Self-pay | Admitting: Family Medicine

## 2020-02-08 ENCOUNTER — Telehealth: Payer: Self-pay | Admitting: Medical Oncology

## 2020-02-08 ENCOUNTER — Other Ambulatory Visit: Payer: Self-pay | Admitting: Medical Oncology

## 2020-02-08 NOTE — Telephone Encounter (Signed)
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 Patient was informed that study experts, after a review of the study, have found that participants treated with aspirin did not do better than participants taking the placebo with regard to the rate of their cancer coming back. Patient was informed that due to these results, it has been recommended that the study be stopped and that she stops taking the study drug at this time. Patient was also informed that she will continue the study specified schedule for study visits and monitoring until further notice and she should continue any current cancer medications and continue follow-up with Dr. Lindi Adie. Patient gave her verbal understanding to this new information and stated she will stop today. Patient states she takes the study Aspirin/placebo in the evenings and will no longer take it going forward. I asked patient to save the study dispensed medication and and her medication logs and bring them with her at her next scheduled appointment with Dr. Lindi Adie, which is currently scheduled for April 13th, 2022. Patient was also informed that unblinding of the study drug should occur within the next few weeks and at that time, we will inform her of whether she was on the study drug aspirin or the placebo. I inquired with patient if she had any questions at this time, patient declined any.  Patient was thanked for her time and contribution to the study and encouraged to call with any questions she may have.  Maxwell Marion, RN, BSN, [facility]

## 2020-02-28 ENCOUNTER — Encounter: Payer: Self-pay | Admitting: Podiatry

## 2020-02-28 ENCOUNTER — Ambulatory Visit (INDEPENDENT_AMBULATORY_CARE_PROVIDER_SITE_OTHER): Payer: PRIVATE HEALTH INSURANCE | Admitting: Podiatry

## 2020-02-28 ENCOUNTER — Other Ambulatory Visit: Payer: Self-pay

## 2020-02-28 DIAGNOSIS — S90212A Contusion of left great toe with damage to nail, initial encounter: Secondary | ICD-10-CM | POA: Diagnosis not present

## 2020-02-28 NOTE — Progress Notes (Signed)
Subjective:  Patient ID: [name], female    DOB: 09/18/1960,  MRN: [id number]  Chief Complaint  Patient presents with  . Nail Problem    Left hallux nail. Possible removal     59 y.o. female presents with the above complaint.  Patient presents with complaint of left hallux 12 nail contusion.  Patient states she dropped a can on top of her foot while at the grocery store.  She states that this happened in October and has progressively gotten worse.  Patient states that there is a new nail growing underneath the old nail however the old nail is severely digging it is hurting her.  She would like to have it removed.  She is a cancer survivor patient.  She denies any other acute complaints.  She has not seen anyone else prior to seeing me.   Review of Systems: Negative except as noted in the HPI. Denies N/V/F/Ch.  Past Medical History:  Diagnosis Date  . Abnormal Pap smear of cervix 2002   Dr. Eilleen Kempf   . Allergy 2012   seasonal  . Anxiety   . Arthritis   . Benign hemangioma    Dr,  Marybelle Killings   . Breast cancer ([place]) 07/20/14   Left Breast  . Cervical stenosis (uterine cervix) 2002  . Depression   . Dizziness 1991   secondary to Meniere's disease  . Hyperlipidemia 2007  . Insomnia 1997  . Meniere's disease 1991  . Nervousness(799.21) 1987    Current Outpatient Medications:  .  acetaminophen (TYLENOL) 325 MG tablet, Take 975 mg by mouth daily as needed for moderate pain. Usually takes twice daily in morning and bedtime and additionally during the day if needed for pain, Disp: , Rfl:  .  alendronate (FOSAMAX) 70 MG tablet, TAKE 1 TABLET ONCE A WEEK. TAKE WITH A FULL GLASS OF WATER ON AN EMPTY STOMACH., Disp: 4 tablet, Rfl: 11 .  anastrozole (ARIMIDEX) 1 MG tablet, TAKE 1 TABLET BY MOUTH EVERY DAY, Disp: 30 tablet, Rfl: 11 .  Biotin 1000 MCG tablet, Take 1,000 mcg by mouth daily., Disp: , Rfl:  .  CALCIUM PO, Take 1 tablet by mouth daily., Disp: , Rfl:  .  clobetasol  cream (TEMOVATE) 0.05 %, Apply topically 2 (two) times daily as needed., Disp: , Rfl:  .  clonazePAM (KLONOPIN) 0.5 MG tablet, Take 1 tablet (0.5 mg total) by mouth 3 (three) times daily as needed., Disp: 90 tablet, Rfl: 5 .  donepezil (ARICEPT) 10 MG tablet, TAKE 1 TABLET BY MOUTH EVERYDAY AT BEDTIME, Disp: 31 tablet, Rfl: 2 .  furosemide (LASIX) 20 MG tablet, Take 1 tablet (20 mg total) by mouth daily., Disp: 30 tablet, Rfl: 3 .  meclizine (ANTIVERT) 25 MG tablet, TAKE 1 TABLET (25 MG TOTAL) BY MOUTH 3 (THREE) TIMES DAILY AS NEEDED FOR DIZZINESS., Disp: 30 tablet, Rfl: 1 .  PARoxetine (PAXIL-CR) 25 MG 24 hr tablet, TAKE 1 TABLET BY MOUTH EVERY DAY, Disp: 30 tablet, Rfl: 11 .  potassium chloride (KLOR-CON) 10 MEQ tablet, Take 1 tablet (10 mEq total) by mouth daily., Disp: 30 tablet, Rfl: 3 .  rosuvastatin (CRESTOR) 10 MG tablet, TAKE 1 TABLET BY MOUTH EVERY DAY, Disp: 30 tablet, Rfl: 1 .  triamcinolone (KENALOG) 0.1 %, Apply topically 2 (two) times daily., Disp: , Rfl:  .  Turmeric 500 MG TABS, Take 1 tablet by mouth daily., Disp: , Rfl:  .  Vitamin D, Ergocalciferol, (DRISDOL) 1.25 MG (50000 UNIT) CAPS capsule, TAKE  1 CAPSULE (50,000 UNITS TOTAL) BY MOUTH EVERY 7 (SEVEN) DAYS., Disp: 12 capsule, Rfl: 3  Social History   Tobacco Use  Smoking Status Never Smoker  Smokeless Tobacco Never Used    Allergies  Allergen Reactions  . Statins Other (See Comments)  . Crestor [Rosuvastatin] Other (See Comments)    myalgias  . Lipitor [Atorvastatin] Other (See Comments)    myalgias  . Temazepam Other (See Comments)    Pt does not remember (Restoril)   Objective:  There were no vitals filed for this visit. There is no height or weight on file to calculate BMI. Constitutional Well developed. Well nourished.  Vascular Dorsalis pedis pulses palpable bilaterally. Posterior tibial pulses palpable bilaterally. Capillary refill normal to all digits.  No cyanosis or clubbing noted. Pedal hair  growth normal.  Neurologic Normal speech. Oriented to person, place, and time. Epicritic sensation to light touch grossly present bilaterally.  Dermatologic Pain on palpation of the entire/total nail on 1st digit of the left No other open wounds. No skin lesions.  Orthopedic: Normal joint ROM without pain or crepitus bilaterally. No visible deformities. No bony tenderness.   Radiographs: None Assessment:   1. Contusion of left great toe with damage to nail, initial encounter    Plan:  Patient was evaluated and treated and all questions answered.  Nail contusion/dystrophy hallux, left -Patient elects to proceed with minor surgery to remove entire toenail today. Consent reviewed and signed by patient. -Entire/total nail excised. See procedure note. -Educated on post-procedure care including soaking. Written instructions provided and reviewed. -Patient to follow up in 2 weeks for nail check.  Procedure: Excision of entire/total nail  Location: Left 1st toe digit Anesthesia: Lidocaine 1% plain; 1.5 mL and Marcaine 0.5% plain; 1.5 mL, digital block. Skin Prep: Betadine. Dressing: Silvadene; telfa; dry, sterile, compression dressing. Technique: Following skin prep, the toe was exsanguinated and a tourniquet was secured at the base of the toe. The affected nail border was freed and excised. The tourniquet was then removed and sterile dressing applied. Disposition: Patient tolerated procedure well. Patient to return in 2 weeks for follow-up.   No follow-ups on file.

## 2020-03-06 ENCOUNTER — Ambulatory Visit (INDEPENDENT_AMBULATORY_CARE_PROVIDER_SITE_OTHER): Payer: PRIVATE HEALTH INSURANCE | Admitting: Family Medicine

## 2020-03-06 ENCOUNTER — Other Ambulatory Visit: Payer: Self-pay | Admitting: Hematology and Oncology

## 2020-03-06 ENCOUNTER — Other Ambulatory Visit: Payer: Self-pay | Admitting: Family Medicine

## 2020-03-06 ENCOUNTER — Other Ambulatory Visit: Payer: Self-pay

## 2020-03-06 VITALS — BP 113/80 | HR 68 | Temp 97.3°F | Ht 65.0 in | Wt 157.0 lb

## 2020-03-06 DIAGNOSIS — Z23 Encounter for immunization: Secondary | ICD-10-CM | POA: Diagnosis not present

## 2020-03-06 DIAGNOSIS — H8101 Meniere's disease, right ear: Secondary | ICD-10-CM | POA: Diagnosis not present

## 2020-03-06 DIAGNOSIS — R739 Hyperglycemia, unspecified: Secondary | ICD-10-CM

## 2020-03-06 DIAGNOSIS — E559 Vitamin D deficiency, unspecified: Secondary | ICD-10-CM

## 2020-03-06 DIAGNOSIS — Z79899 Other long term (current) drug therapy: Secondary | ICD-10-CM

## 2020-03-06 LAB — GLUCOSE HEMOCUE WAIVED: Glu Hemocue Waived: 101 mg/dL — ABNORMAL HIGH (ref 65–99)

## 2020-03-06 MED ORDER — PAROXETINE HCL ER 25 MG PO TB24: 25.0000 mg | ORAL_TABLET | Freq: Every day | ORAL | 11 refills | Status: DC

## 2020-03-06 MED ORDER — CLONAZEPAM 0.5 MG PO TABS: 0.5000 mg | ORAL_TABLET | Freq: Three times a day (TID) | ORAL | 5 refills | Status: DC | PRN

## 2020-03-06 MED ORDER — VITAMIN D (ERGOCALCIFEROL) 1.25 MG (50000 UNIT) PO CAPS: ORAL_CAPSULE | ORAL | 3 refills | Status: DC

## 2020-03-06 MED ORDER — ROSUVASTATIN CALCIUM 10 MG PO TABS: 10.0000 mg | ORAL_TABLET | Freq: Every day | ORAL | 12 refills | Status: DC

## 2020-03-06 NOTE — Progress Notes (Signed)
Subjective: CC: follow up Mnire's disease  PCP: [name], DO GLO:VFIEPP [name] is a 59 y.o. female presenting to clinic today for:  1.  Mnire's disease Patient with several year history of Mnire's disease.  She has been treated with various medications in the past but nothing was effective except for Klonopin and Paxil.    Symptoms have been stable.  She continues to use Klonopin as needed Mnire's.  She did have an episode recently but did not require the extra Klonopin.  She used meclizine and the symptoms resolved.  Denies any excessive daytime sedation, falls, visual auditory hallucinations.  No respiratory depression.  2.  Elevated blood sugar Patient has had elevated serum glucose on the last couple of checks.  She notes that she has changed her diet to the keto diet in hopes that this will improve her health.  She would like to get her blood sugar rechecked today  ROS: Per HPI  Allergies  Allergen Reactions  . Statins Other (See Comments)  . Crestor [Rosuvastatin] Other (See Comments)    myalgias  . Lipitor [Atorvastatin] Other (See Comments)    myalgias  . Temazepam Other (See Comments)    Pt does not remember (Restoril)   Past Medical History:  Diagnosis Date  . Abnormal Pap smear of cervix 2002   Dr. Eilleen Kempf   . Allergy 2012   seasonal  . Anxiety   . Arthritis   . Benign hemangioma    Dr,  Marybelle Killings   . Breast cancer ([place]) 07/20/14   Left Breast  . Cervical stenosis (uterine cervix) 2002  . Depression   . Dizziness 1991   secondary to Meniere's disease  . Hyperlipidemia 2007  . Insomnia 1997  . Meniere's disease 1991  . Nervousness(799.21) 1987    Current Outpatient Medications:  .  acetaminophen (TYLENOL) 325 MG tablet, Take 975 mg by mouth daily as needed for moderate pain. Usually takes twice daily in morning and bedtime and additionally during the day if needed for pain, Disp: , Rfl:  .  alendronate (FOSAMAX) 70 MG tablet, TAKE  1 TABLET ONCE A WEEK. TAKE WITH A FULL GLASS OF WATER ON AN EMPTY STOMACH., Disp: 4 tablet, Rfl: 11 .  anastrozole (ARIMIDEX) 1 MG tablet, TAKE 1 TABLET BY MOUTH EVERY DAY, Disp: 30 tablet, Rfl: 11 .  Biotin 1000 MCG tablet, Take 1,000 mcg by mouth daily., Disp: , Rfl:  .  CALCIUM PO, Take 1 tablet by mouth daily., Disp: , Rfl:  .  clobetasol cream (TEMOVATE) 0.05 %, Apply topically 2 (two) times daily as needed., Disp: , Rfl:  .  clonazePAM (KLONOPIN) 0.5 MG tablet, Take 1 tablet (0.5 mg total) by mouth 3 (three) times daily as needed., Disp: 90 tablet, Rfl: 5 .  donepezil (ARICEPT) 10 MG tablet, TAKE 1 TABLET BY MOUTH EVERYDAY AT BEDTIME, Disp: 31 tablet, Rfl: 2 .  furosemide (LASIX) 20 MG tablet, Take 1 tablet (20 mg total) by mouth daily., Disp: 30 tablet, Rfl: 3 .  meclizine (ANTIVERT) 25 MG tablet, TAKE 1 TABLET (25 MG TOTAL) BY MOUTH 3 (THREE) TIMES DAILY AS NEEDED FOR DIZZINESS., Disp: 30 tablet, Rfl: 1 .  PARoxetine (PAXIL-CR) 25 MG 24 hr tablet, TAKE 1 TABLET BY MOUTH EVERY DAY, Disp: 30 tablet, Rfl: 11 .  potassium chloride (KLOR-CON) 10 MEQ tablet, Take 1 tablet (10 mEq total) by mouth daily., Disp: 30 tablet, Rfl: 3 .  rosuvastatin (CRESTOR) 10 MG tablet, TAKE 1 TABLET BY  MOUTH EVERY DAY, Disp: 30 tablet, Rfl: 1 .  triamcinolone (KENALOG) 0.1 %, Apply topically 2 (two) times daily., Disp: , Rfl:  .  Turmeric 500 MG TABS, Take 1 tablet by mouth daily., Disp: , Rfl:  .  Vitamin D, Ergocalciferol, (DRISDOL) 1.25 MG (50000 UNIT) CAPS capsule, TAKE 1 CAPSULE (50,000 UNITS TOTAL) BY MOUTH EVERY 7 (SEVEN) DAYS., Disp: 12 capsule, Rfl: 3 Social History   Socioeconomic History  . Marital status: Married    Spouse name: Not on file  . Number of children: Not on file  . Years of education: Not on file  . Highest education level: Not on file  Occupational History  . Not on file  Tobacco Use  . Smoking status: Never Smoker  . Smokeless tobacco: Never Used  Vaping Use  . Vaping Use: Never  used  Substance and Sexual Activity  . Alcohol use: Yes    Comment: very rare  . Drug use: No  . Sexual activity: Yes    Birth control/protection: Surgical    Comment: ablation  Other Topics Concern  . Not on file  Social History Narrative  . Not on file   Social Determinants of Health   Financial Resource Strain: Not on file  Food Insecurity: Not on file  Transportation Needs: Not on file  Physical Activity: Not on file  Stress: Not on file  Social Connections: Not on file  Intimate Partner Violence: Not on file   Family History  Problem Relation Age of Onset  . Cirrhosis Father   . Colon polyps Mother   . Colon cancer Neg Hx   . Esophageal cancer Neg Hx   . Rectal cancer Neg Hx   . Stomach cancer Neg Hx     Objective: Office vital signs reviewed. There were no vitals taken for this visit.  Physical Examination:  General: Awake, alert, well nourished, well appearing. No acute distress HEENT: Normal, sclera white, MMM; bilateral TMs with mild clear effusion noted.  No evidence of secondary infection.  No bulging.  No perforation. Cardio: regular rate and rhythm, S1S2 heard, no murmurs appreciated Pulm: clear to auscultation bilaterally, no wheezes, rhonchi or rales; normal work of breathing on room air Psych: Mood stable, speech normal, affect appropriate, pleasant and interactive.   Depression screen [facility] 2/9 09/05/2019 05/06/2019 02/15/2019 10/15/2018 04/15/2018  Decreased Interest 0 0 0 0 0  Down, Depressed, Hopeless 0 0 0 0 0  PHQ - 2 Score 0 0 0 0 0  Altered sleeping - 0 2 0 -  Tired, decreased energy - 0 0 0 -  Change in appetite - 0 0 0 -  Feeling bad or failure about yourself  - 0 0 0 -  Trouble concentrating - 0 0 0 -  Moving slowly or fidgety/restless - 0 0 0 -  Suicidal thoughts - 0 0 0 -  PHQ-9 Score - 0 2 0 -  Difficult doing work/chores - - Not difficult at all - -  Some recent data might be hidden    Assessment/ Plan:  Meniere's disease of right ear  - Plan: Drug Screen 10 W/Conf, Se, clonazePAM (KLONOPIN) 0.5 MG tablet, CANCELED: ToxASSURE Select 13 (MW), Urine  Controlled substance agreement signed - Plan: Drug Screen 10 W/Conf, Se, CANCELED: ToxASSURE Select 13 (MW), Urine  Vitamin D deficiency - Plan: Vitamin D, Ergocalciferol, (DRISDOL) 1.25 MG (50000 UNIT) CAPS capsule  Elevated serum glucose - Plan: Glucose Hemocue Waived  Need for immunization against influenza -  Plan: Flu Vaccine QUAD 36+ mos IM   She has had 1 episode since last visit but this resolved with meclizine alone.  UDS and CSC were updated as per office policy.  National narcotic database was reviewed and there were no red flags.  Klonopin renewed  Vitamin D renewed.  Check point-of-care glucose since this was mildly elevated in the past.  She is currently on the keto diet so hopefully this is improving  Influenza vaccination administered  The Narcotic Database has been reviewed.  There were no red flags.    No orders of the defined types were placed in this encounter.  No orders of the defined types were placed in this encounter.  [name], DO [place] [phone]

## 2020-03-07 ENCOUNTER — Telehealth: Payer: Self-pay | Admitting: Podiatry

## 2020-03-07 NOTE — Telephone Encounter (Signed)
Patient had her nail removed and everything is healing great but she is going to the beach and wanted to know if there would be an issue with the water and sand.

## 2020-03-09 LAB — DRUG SCREEN 10 W/CONF, SERUM
Amphetamines, IA: NEGATIVE ng/mL
Barbiturates, IA: NEGATIVE ug/mL
Benzodiazepines, IA: NEGATIVE ng/mL
Cocaine & Metabolite, IA: NEGATIVE ng/mL
Methadone, IA: NEGATIVE ng/mL
Opiates, IA: NEGATIVE ng/mL
Oxycodones, IA: NEGATIVE ng/mL
Phencyclidine, IA: NEGATIVE ng/mL
Propoxyphene, IA: NEGATIVE ng/mL
THC(Marijuana) Metabolite, IA: NEGATIVE ng/mL

## 2020-03-13 ENCOUNTER — Encounter: Payer: Self-pay | Admitting: Family Medicine

## 2020-03-13 NOTE — Telephone Encounter (Signed)
That should be okay.  If she has any clinical signs of infection then apply Betadine wet-to-dry dressings and come see me right away or go to the emergency room.

## 2020-04-30 ENCOUNTER — Other Ambulatory Visit: Payer: Self-pay | Admitting: Family Medicine

## 2020-06-19 ENCOUNTER — Telehealth: Payer: Self-pay | Admitting: Medical Oncology

## 2020-06-19 ENCOUNTER — Other Ambulatory Visit: Payer: Self-pay | Admitting: Medical Oncology

## 2020-06-19 DIAGNOSIS — C50412 Malignant neoplasm of upper-outer quadrant of left female breast: Secondary | ICD-10-CM

## 2020-06-19 NOTE — Progress Notes (Signed)
Patient Care Team: [name], DO as PCP - General (Family Medicine) Nicholas Lose, MD as Consulting Physician (Hematology and Oncology) Coralie Keens, MD as Consulting Physician (General Surgery) Kyung Rudd, MD as Consulting Physician (Radiation Oncology) Sylvan Cheese, NP as Nurse Practitioner (Hematology and Oncology)  DIAGNOSIS:    ICD-10-CM   1. Malignant neoplasm of upper-outer quadrant of left breast in female, estrogen receptor positive ([place])  C50.412 CBC with Differential ([place] Only)   Z17.0 [place] ([place] only)    SUMMARY OF ONCOLOGIC HISTORY: Oncology History  Malignant neoplasm of upper-outer quadrant of left breast in female, estrogen receptor positive ([place])  07/18/2014 Mammogram   Distortion left breast, breast density category C; U/S 1.3 x 0.8 x 0.8 cm left breast mass at 2:00 position 4 cm from nipple, no lymph nodes   07/20/2014 Initial Diagnosis   Left breast Biopsy: Invasive lobular cancer with LCIS, Grade 1, ER 86%, PR 78%, Her 2 Neg Ratio 1.77, Ki 67: 14%   08/01/2014 Breast MRI   Breast MRI showed non-mass enhancement 3.9 cm, no lymph nodes   08/01/2014 Clinical Stage   Stage IIA: T2 N0   08/08/2014 Surgery   Left breast lumpectomy: 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, with LCIS, medial and inferior margins positive, 3/4 lymph nodes positive   08/08/2014 Pathologic Stage   Stage IIB: T2 N1c M0   08/16/2014 Surgery   Left breast medial margin reexcision residual ILC 0.2 cm; inferior margin residual foci less than 0.2 cm, 1/5 lymph nodes positive, 1 lymph node with isolated tumor cells (Overall 4/10)   09/04/2014 - 01/15/2015 Chemotherapy   Adjuvant chemotherapy with dose dense Adriamycin and Cytoxan 4 followed by Abraxane weekly 8 ( stopped early for profound neutropenia and thrombocytopenia)   01/29/2015 - 03/20/2015 Radiation Therapy   Adjuvant XRT [facility]): 50.4 Gy over 28 fractions; seroma boost: 10 Gy over 5  fractions. Total dose: 60.4 Gy   04/11/2015 -  Anti-estrogen oral therapy   Anastrozole 1 mg daily (PALLAS clinical trial 05/24/2015 patient was randomized to hormone therapy alone)   05/24/2015 Survivorship   Survivorship visit completed and copy of care plan given to patient   11/26/2016 PET scan   Small Left sided level 2 LN with hypermetabolism, post surg changes in breast and small right groin lymph node likely infectious/inflammatory, gallstones     CHIEF COMPLIANT: Follow-up of left breast cancer on anastrozole  INTERVAL HISTORY: [name] is a 60 y.o. with above-mentioned history of left breast cancertreated with lumpectomy, adjuvant chemotherapy, and radiation who iscurrently on oral antiestrogen therapy with anastrozole.She is a participant in the [place] trials.She presents to the clinic todayfor follow-up. She does not have any new complaints or concerns.  Denies any lumps or nodules in the breast.  She does have occasional bruising still in spite of coming off aspirin from the ABC clinical trial.  She had lost significant amount of weight of 11 to 12 pounds with dieting and exercise.  She gained a few of those pounds back but she is planning to go back on the low-carb diet.  ALLERGIES:  is allergic to statins, crestor [rosuvastatin], lipitor [atorvastatin], and temazepam.  MEDICATIONS:  Current Outpatient Medications  Medication Sig Dispense Refill  . acetaminophen (TYLENOL) 325 MG tablet Take 975 mg by mouth daily as needed for moderate pain. Usually takes twice daily in morning and bedtime and additionally during the day if needed for pain    . alendronate (  FOSAMAX) 70 MG tablet TAKE 1 TABLET ONCE A WEEK. TAKE WITH A FULL GLASS OF WATER ON AN EMPTY STOMACH. 4 tablet 11  . anastrozole (ARIMIDEX) 1 MG tablet TAKE 1 TABLET BY MOUTH EVERY DAY 30 tablet 11  . Biotin 1000 MCG tablet Take 1,000 mcg by mouth daily.    Marland Kitchen CALCIUM PO Take 1 tablet by mouth  daily.    . clobetasol cream (TEMOVATE) 0.05 % Apply topically 2 (two) times daily as needed.    . clonazePAM (KLONOPIN) 0.5 MG tablet Take 1 tablet (0.5 mg total) by mouth 3 (three) times daily as needed (put on file). 90 tablet 5  . donepezil (ARICEPT) 10 MG tablet TAKE 1 TABLET BY MOUTH EVERYDAY AT BEDTIME 31 tablet 2  . furosemide (LASIX) 20 MG tablet TAKE 1 TABLET BY MOUTH EVERY DAY 30 tablet 3  . meclizine (ANTIVERT) 25 MG tablet TAKE 1 TABLET (25 MG TOTAL) BY MOUTH 3 (THREE) TIMES DAILY AS NEEDED FOR DIZZINESS. 30 tablet 1  . PARoxetine (PAXIL-CR) 25 MG 24 hr tablet Take 1 tablet (25 mg total) by mouth daily. 30 tablet 11  . potassium chloride (KLOR-CON) 10 MEQ tablet TAKE 1 TABLET BY MOUTH EVERY DAY 30 tablet 3  . rosuvastatin (CRESTOR) 10 MG tablet Take 1 tablet (10 mg total) by mouth daily. 30 tablet 12  . triamcinolone (KENALOG) 0.1 % Apply topically 2 (two) times daily.    . Turmeric 500 MG TABS Take 1 tablet by mouth daily.    . Vitamin D, Ergocalciferol, (DRISDOL) 1.25 MG (50000 UNIT) CAPS capsule TAKE 1 CAPSULE (50,000 UNITS TOTAL) BY MOUTH EVERY 7 (SEVEN) DAYS. 12 capsule 3   No current facility-administered medications for this visit.    PHYSICAL EXAMINATION: ECOG PERFORMANCE STATUS: 1 - Symptomatic but completely ambulatory  Vitals:   06/20/20 0821  BP: 137/78  Pulse: 64  Resp: 20  Temp: 98.1 F (36.7 C)  SpO2: 99%   Filed Weights   06/20/20 0821  Weight: 157 lb (71.2 kg)    BREAST: No palpable masses or nodules in either right or left breasts. No palpable axillary supraclavicular or infraclavicular adenopathy no breast tenderness or nipple discharge. (exam performed in the presence of a chaperone)  LABORATORY DATA:  I have reviewed the data as listed CMP Latest Ref Rng & Units 06/20/2020 12/28/2019 06/28/2019  Glucose 70 - 99 mg/dL 109(H) 101(H) 118(H)  BUN 6 - 20 mg/dL 8 15 14   Creatinine 0.44 - 1.00 mg/dL 0.63 0.66 0.72  Sodium 135 - 145 mmol/L 142 141 141   Potassium 3.5 - 5.1 mmol/L 3.7 3.5 3.9  Chloride 98 - 111 mmol/L 106 106 106  CO2 22 - 32 mmol/L 25 28 25   Calcium 8.9 - 10.3 mg/dL 8.6(L) 9.3 8.8(L)  Total Protein 6.5 - 8.1 g/dL 6.8 6.9 6.7  Total Bilirubin 0.3 - 1.2 mg/dL 0.9 0.8 0.6  Alkaline Phos 38 - 126 U/L 56 64 62  AST 15 - 41 U/L 18 18 21   ALT 0 - 44 U/L 19 21 25     Lab Results  Component Value Date   WBC 4.0 06/20/2020   HGB 13.6 06/20/2020   HCT 38.9 06/20/2020   MCV 93.7 06/20/2020   PLT 182 06/20/2020   NEUTROABS 1.9 06/20/2020    ASSESSMENT & PLAN:  Malignant neoplasm of upper-outer quadrant of left breast in female, estrogen receptor positive ([place]) Left breast lumpectomy 08/08/2014: 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, with LCIS, medial and inferior  margins positive, 3/4 lymph nodes positive T2 N1 cM0 stage IIB, ER 86%, PR 78%, HER-2/neu negative ratio 1.77, Ki-67 14% Left breast medial margin reexcision 08/18/14: residual ILC 0.2 cm; inferior margin residual foci less than 0.2 cm, 1/5 lymph nodes positive, 1 lymph node with isolated tumor cells (Overall 4/10)  Treatment Summary: 1. Adjuvant chemotherapy with dose dense Adriamycin and Cytoxan x 4 followed by Abraxane weekly 12 because of lymph node positive disease. Started 09/04/14 to 01/15/15 2. adjuvant radiation completed 03/20/15 3. Followed by antiestrogen therapy started 04/03/15 -------------------------------------------------------------------------------------------------------------------------- Anastrozole toxicities: Patient denies any hot flashes. Weight gain Arthralgias and myalgias    PALLAS Trial : Patient has been randomized to anastrozole alone. ABC clinical trial: The trial randomizes between aspirin versus placebo. She went on trial 07/26/2015 Currently on 100 mg of dosage.  No adverse effects from participating in the trial.  Adverse effects: 1. Bruising/ Hematoma: Currently taking100 mg of aspirin/placebo:Mild 2.  Osteoporosis: Fosamax weekly,recommended that she get a D[facility] scan in the next month [facility].  Cognitive dysfunction: OnAricept 10 mg daily, followed by PCP, doing much better  Weight gain:On Weight Watchers, now doing Keto diet, exercising, stable  Breast cancer surveillance: 1. Mammogram 07/28/2019 Benign 2. Breast exam4/13/22: No palpable lumps or nodules of concern. 3.CT CAP 10/28/2017: No evidence of malignancy, radiation changes anterior left upper lobe  Bone density 07/26/2015: T score -1.9, osteopenia Bone density: 07/28/19 T score -2.1 currently on Fosamax  Her daughter is going to early [place] and she is very proud of her.  She is hoping to go into movies/TV production. No clinical evidence of breast cancer recurrence. Return to clinic in 6 months for follow-up    Orders Placed This Encounter  Procedures  . CBC with Differential (Cancer Center Only)    Standing Status:   Future    Standing Expiration Date:   06/20/2021  . CMP ([place] only)    Standing Status:   Future    Standing Expiration Date:   06/20/2021   The patient has a good understanding of the overall plan. she agrees with it. she will call with any problems that may develop before the next visit here.  Total time spent: 20 mins including face to face time and time spent for planning, charting and coordination of care  Rulon Eisenmenger, MD, MPH 06/20/2020  I, Molly Dorshimer, am acting as scribe for Dr. Nicholas Lose.  I have reviewed the above documentation for accuracy and completeness, and I agree with the above.

## 2020-06-19 NOTE — Assessment & Plan Note (Signed)
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 Adjuvant chemotherapy with dose dense Adriamycin and Cytoxan x 4 followed by Abraxane weekly 12 because of lymph node positive disease. Started 09/04/14 to 01/15/15 2. adjuvant radiation completed 03/20/15 3. Followed by antiestrogen therapy started 04/03/15 -------------------------------------------------------------------------------------------------------------------------- Anastrozole toxicities: Patient denies any hot flashes. Weight gain Arthralgias and myalgias    PALLAS Trial : Patient has been randomized to anastrozole alone. ABC clinical trial: The trial randomizes between aspirin versus placebo. She went on trial 07/26/2015 Currently on 100 mg of dosage.  No adverse effects from participating in the trial.  Adverse effects: 1. Bruising/ Hematoma: Currently taking100 mg of aspirin/placebo:Mild 2. Osteoporosis: Fosamax weekly,recommended that she get a DEXA scan in the next month at [facility].  Cognitive dysfunction: OnAricept 10 mg daily, followed by PCP, doing much better  Weight gain:On Weight Watchers, now doing Keto diet, exercising, stable  Breast cancer surveillance: 1. Mammogram 07/28/2019 Benign 2. Breast exam4/13/22: No palpable lumps or nodules of concern. 3.CT CAP 10/28/2017: No evidence of malignancy, radiation changes anterior left upper lobe  Bone density 07/26/2015: T score -1.9, osteopenia Bone density: 07/28/19 T score -2.1 currently on Fosamax  Her daughter is going to early [employer] and  she is very proud of her. No clinical evidence of breast cancer recurrence. Return to clinic in 6 months for follow-up

## 2020-06-19 NOTE — Telephone Encounter (Signed)
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  [id number]: A RANDOMIZED PHASE III DOUBLE BLINDED PLACEBO CONTROLLED TRIAL OF ASPIRIN AS ADJUVANT THERAPY FOR HER2 NEGATIVE BREAST CANCER: THE ABC TRIAL.  Outgoing call: Call to patient for appointment reminder for labs and MD visit, tomorrow morning at 0745. Asked patient to bring is ABC medication diaries and the remaining study dispensed Aspirin/Placebo pills. Patient confirmed appointment, as well as will bring requested items. Patient thanked for her time.  Maxwell Marion, RN, BSN, [facility] Clinical Research 06/19/2020 4:25 PM

## 2020-06-20 ENCOUNTER — Encounter: Payer: Self-pay | Admitting: Medical Oncology

## 2020-06-20 ENCOUNTER — Inpatient Hospital Stay (HOSPITAL_BASED_OUTPATIENT_CLINIC_OR_DEPARTMENT_OTHER): Payer: PRIVATE HEALTH INSURANCE | Admitting: Hematology and Oncology

## 2020-06-20 ENCOUNTER — Inpatient Hospital Stay: Payer: PRIVATE HEALTH INSURANCE | Attending: Hematology and Oncology

## 2020-06-20 ENCOUNTER — Other Ambulatory Visit: Payer: Self-pay | Admitting: Hematology and Oncology

## 2020-06-20 ENCOUNTER — Other Ambulatory Visit: Payer: Self-pay

## 2020-06-20 DIAGNOSIS — R4189 Other symptoms and signs involving cognitive functions and awareness: Secondary | ICD-10-CM | POA: Diagnosis not present

## 2020-06-20 DIAGNOSIS — Z9221 Personal history of antineoplastic chemotherapy: Secondary | ICD-10-CM | POA: Insufficient documentation

## 2020-06-20 DIAGNOSIS — Z170 Estrogen receptor positive status [ER+]: Secondary | ICD-10-CM

## 2020-06-20 DIAGNOSIS — Z79899 Other long term (current) drug therapy: Secondary | ICD-10-CM | POA: Insufficient documentation

## 2020-06-20 DIAGNOSIS — M810 Age-related osteoporosis without current pathological fracture: Secondary | ICD-10-CM | POA: Diagnosis not present

## 2020-06-20 DIAGNOSIS — Z006 Encounter for examination for normal comparison and control in clinical research program: Secondary | ICD-10-CM

## 2020-06-20 DIAGNOSIS — C50412 Malignant neoplasm of upper-outer quadrant of left female breast: Secondary | ICD-10-CM

## 2020-06-20 DIAGNOSIS — Z923 Personal history of irradiation: Secondary | ICD-10-CM | POA: Insufficient documentation

## 2020-06-20 LAB — CBC WITH DIFFERENTIAL (CANCER CENTER ONLY)
Abs Immature Granulocytes: 0.01 10*3/uL (ref 0.00–0.07)
Basophils Absolute: 0 10*3/uL (ref 0.0–0.1)
Basophils Relative: 1 %
Eosinophils Absolute: 0.1 10*3/uL (ref 0.0–0.5)
Eosinophils Relative: 1 %
HCT: 38.9 % (ref 36.0–46.0)
Hemoglobin: 13.6 g/dL (ref 12.0–15.0)
Immature Granulocytes: 0 %
Lymphocytes Relative: 40 %
Lymphs Abs: 1.6 10*3/uL (ref 0.7–4.0)
MCH: 32.8 pg (ref 26.0–34.0)
MCHC: 35 g/dL (ref 30.0–36.0)
MCV: 93.7 fL (ref 80.0–100.0)
Monocytes Absolute: 0.4 10*3/uL (ref 0.1–1.0)
Monocytes Relative: 10 %
Neutro Abs: 1.9 10*3/uL (ref 1.7–7.7)
Neutrophils Relative %: 48 %
Platelet Count: 182 10*3/uL (ref 150–400)
RBC: 4.15 MIL/uL (ref 3.87–5.11)
RDW: 12.6 % (ref 11.5–15.5)
WBC Count: 4 10*3/uL (ref 4.0–10.5)
nRBC: 0 % (ref 0.0–0.2)

## 2020-06-20 LAB — CMP (CANCER CENTER ONLY)
ALT: 19 U/L (ref 0–44)
AST: 18 U/L (ref 15–41)
Albumin: 4.3 g/dL (ref 3.5–5.0)
Alkaline Phosphatase: 56 U/L (ref 38–126)
Anion gap: 11 (ref 5–15)
BUN: 8 mg/dL (ref 6–20)
CO2: 25 mmol/L (ref 22–32)
Calcium: 8.6 mg/dL — ABNORMAL LOW (ref 8.9–10.3)
Chloride: 106 mmol/L (ref 98–111)
Creatinine: 0.63 mg/dL (ref 0.44–1.00)
GFR, Estimated: >60 mL/min (ref >60)
Glucose, Bld: 109 mg/dL — ABNORMAL HIGH (ref 70–99)
Potassium: 3.7 mmol/L (ref 3.5–5.1)
Sodium: 142 mmol/L (ref 135–145)
Total Bilirubin: 0.9 mg/dL (ref 0.3–1.2)
Total Protein: 6.8 g/dL (ref 6.5–8.1)

## 2020-06-20 LAB — RESEARCH LABS

## 2020-06-20 NOTE — Progress Notes (Signed)
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  60 Month follow-up Treatment Arm B: clinic visit  Patient presents alone to her appointment today. I met with patient during her scheduled lab appointment and prior to her routine follow up appointment with Dr. Lindi Adie. Patient reports to be doing well and denies having any issues or complaints and with no SAE's. Patient confirms taking anastrozole as instructed and no new anti-cancer medications. Patient with no  Dr. Lindi Adie addressed patient's questions/concerns. Most recent COVID-19 vaccination booster #3 documented as given on 01/11/2020.  In clinic breast exam completed today.   Labs: Central study labs collected per protocol.   Plan: Patient was informed she will be called for her 6 year follow-up in approximately one years time. Patient denies having any questions at this time. I thanked patient for her time today and continued support of study and encouraged her to contact Dr. Lindi Adie or myself with questions or concerns.   Maxwell Marion, RN, BSN, [facility] Clinical Research 06/20/2020 11:45 AM

## 2020-06-20 NOTE — Progress Notes (Signed)
[id number]: A RANDOMIZED PHASE III DOUBLE BLINDED PLACEBO CONTROLLED TRIAL OF ASPIRIN AS ADJUVANT THERAPY FOR HER2 NEGATIVE BREAST CANCER: THE ABC TRIAL  Patient presents alone to the clinic today for her EOT visit (60 month on-study visit) and H&P with Dr. Lindi Adie. Patient's vital signs collected and documented. I met with patient just before her scheduled lab appointment and patient reports to be doing well. This study has stopped and patient was informed of this back in December of 2021, at which time she was told to stop the study drug. Today patient returned study medication diary as well as study dispensed Aspirin/Placebo. Patient was also informed of the unblinding of study drug and that she was receiving Aspirin and not the placebo. Patient was also provided Research Participant letter from the study, informing her of the next steps going forward. Patient understands that study specified visits will continue until further notice.     Concomitant Medications - Patient confirms no non-protocol use of an NSAID.    Investigational Medication - Patient returned her previously dispensed study medication bottle. This bottle (Rx [id number], 100 mg) was taken to pharmacy for documentation of return and count. Count verified by PharmD Kennith Center that 159 tablets returned.    Aspirin/Placebo Medication Log: Patient provided me today with her medication log with start date of 12/28/2019 through 02/08/2020. Patient reports and confirms no missed doses. With patient's return today of 159 pills (from previous dispensed bottle of #200), per count, and per patient's medication log, visits  of 12/28/2019- 02/08/2020 = 43 days, patient reporting a total of  43 aspirin/placebo taken and 0 missed days. With study ending early, patient compliance with study medication greater than 80%.    Adverse Events and Solicited AEs:  Patient denies any changes in her health.  She confirms she was tolerating the study drug well. She  denies experiencing any of the solicited AEs: ICH, GI bleed, epistaxis, hematuria, dyspepsia, gastritis. Patient does confirm some minor bruising, Grade 1.    H&P with ECOG and Disease Assessment:  Patient seen by Dr. Lindi Adie today who addressed all of patient's questions/concerns. Breast examination completed today.  Plan/Visit Scheduling - Patient was thanked today for her time and support of study. Patient was informed that she has completed her 5 years on study and is aware that she is in follow up and will be contacted by study on a yearly basis, until further notice. All patient's questions answered to her satisfaction and patient was encouraged to call Dr. Lindi Adie or myself with any questions/concerns she may have.   Adverse Event Log  [facility] [id number] 12/28/2019 - 06/21/2019 Baseline AEs (grade): dizziness, int. (1); anxiety (2);joint pain, int. (2);seasonal allergies(1)  Event Grade Attribution to aspirin/placebo Comments  Weight gain 2 Possible  Stable  Bruising  1 Probable Stable     Maxwell Marion, RN, BSN, [facility] Clinical Research 06/20/2020 10:58 AM

## 2020-07-03 ENCOUNTER — Other Ambulatory Visit: Payer: 59

## 2020-07-03 ENCOUNTER — Other Ambulatory Visit: Payer: Self-pay | Admitting: Hematology and Oncology

## 2020-07-03 ENCOUNTER — Ambulatory Visit: Payer: 59 | Admitting: Hematology and Oncology

## 2020-07-03 DIAGNOSIS — C50412 Malignant neoplasm of upper-outer quadrant of left female breast: Secondary | ICD-10-CM

## 2020-08-04 ENCOUNTER — Other Ambulatory Visit: Payer: Self-pay | Admitting: Family Medicine

## 2020-08-08 ENCOUNTER — Other Ambulatory Visit: Payer: Self-pay | Admitting: Family Medicine

## 2020-08-22 ENCOUNTER — Ambulatory Visit (INDEPENDENT_AMBULATORY_CARE_PROVIDER_SITE_OTHER): Payer: PRIVATE HEALTH INSURANCE | Admitting: Family Medicine

## 2020-08-22 ENCOUNTER — Other Ambulatory Visit: Payer: Self-pay

## 2020-08-22 ENCOUNTER — Encounter: Payer: Self-pay | Admitting: Family Medicine

## 2020-08-22 VITALS — BP 134/85 | HR 67 | Temp 97.9°F | Ht 65.0 in | Wt 158.2 lb

## 2020-08-22 DIAGNOSIS — R739 Hyperglycemia, unspecified: Secondary | ICD-10-CM

## 2020-08-22 DIAGNOSIS — C50412 Malignant neoplasm of upper-outer quadrant of left female breast: Secondary | ICD-10-CM | POA: Diagnosis not present

## 2020-08-22 DIAGNOSIS — E559 Vitamin D deficiency, unspecified: Secondary | ICD-10-CM

## 2020-08-22 DIAGNOSIS — Z0001 Encounter for general adult medical examination with abnormal findings: Secondary | ICD-10-CM | POA: Diagnosis not present

## 2020-08-22 DIAGNOSIS — Z170 Estrogen receptor positive status [ER+]: Secondary | ICD-10-CM

## 2020-08-22 DIAGNOSIS — H8101 Meniere's disease, right ear: Secondary | ICD-10-CM | POA: Diagnosis not present

## 2020-08-22 DIAGNOSIS — M818 Other osteoporosis without current pathological fracture: Secondary | ICD-10-CM

## 2020-08-22 DIAGNOSIS — Z1211 Encounter for screening for malignant neoplasm of colon: Secondary | ICD-10-CM

## 2020-08-22 DIAGNOSIS — E7800 Pure hypercholesterolemia, unspecified: Secondary | ICD-10-CM

## 2020-08-22 DIAGNOSIS — Z0000 Encounter for general adult medical examination without abnormal findings: Secondary | ICD-10-CM

## 2020-08-22 LAB — BAYER DCA HB A1C WAIVED: HB A1C (BAYER DCA - WAIVED): 5.3 % (ref <7.0)

## 2020-08-22 MED ORDER — PAROXETINE HCL ER 25 MG PO TB24: 25.0000 mg | ORAL_TABLET | Freq: Every day | ORAL | 11 refills | Status: DC

## 2020-08-22 MED ORDER — ROSUVASTATIN CALCIUM 10 MG PO TABS: 10.0000 mg | ORAL_TABLET | Freq: Every day | ORAL | 12 refills | Status: DC

## 2020-08-22 MED ORDER — VITAMIN D (ERGOCALCIFEROL) 1.25 MG (50000 UNIT) PO CAPS: ORAL_CAPSULE | ORAL | 3 refills | Status: DC

## 2020-08-22 MED ORDER — CLONAZEPAM 0.5 MG PO TABS: 0.5000 mg | ORAL_TABLET | Freq: Three times a day (TID) | ORAL | 5 refills | Status: DC | PRN

## 2020-08-22 MED ORDER — DONEPEZIL HCL 10 MG PO TABS: 10.0000 mg | ORAL_TABLET | Freq: Every day | ORAL | 12 refills | Status: DC

## 2020-08-22 MED ORDER — ALENDRONATE SODIUM 70 MG PO TABS: ORAL_TABLET | ORAL | 3 refills | Status: DC

## 2020-08-22 NOTE — Progress Notes (Signed)
[name] is a 60 y.o. female presents to office today for annual physical exam examination.    Concerns today include: 1.  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  She gets this refilled less than every 2 months and often only uses the clonazepam once per day.  No excessive daytime sedation, falls or respiratory depression.  2.  Skin changes Patient reports that she is had some skin thickening and hyperpigmentation noted along the right ankle and foot.  She had potentially sustained an injury but the symptoms have been ongoing for over a year now.  No applied corticosteroids to the area but she is prescribed corticosteroids by her specialist.  Diet: fairly balanced, Exercise: stays active Last eye exam: UTD Last dental exam: UTD Last colonoscopy: UTD, needs yearly FOBT Last mammogram: UTD Last pap smear: UTD, ASCUS on 08/07/20 pap. Refills needed today: unsure Immunizations needed: PNA and shingles Immunization History  Administered Date(s) Administered   Influenza,inj,Quad PF,6+ Mos 02/06/2014, 01/09/2016, 12/14/2017, 03/06/2020   Influenza-Unspecified 12/08/2018   Moderna Sars-Covid-2 Vaccination 04/05/2019, 05/03/2019, 01/11/2020    Past Medical History:  Diagnosis Date   Abnormal Pap smear of cervix 2002   Dr. Eilleen Kempf    Allergy 2012   seasonal   Anxiety    Arthritis    Benign hemangioma    Dr,  Marybelle Killings    Breast cancer [facility]) 07/20/14   Left Breast   Cervical stenosis (uterine cervix) 2002   Depression    Dizziness 1991   secondary to Meniere's disease   Hyperlipidemia 2007   Insomnia 1997   Meniere's disease 1991   Nervousness(799.21) 1987   Social History   Socioeconomic History   Marital status: Married    Spouse name: Not on file   Number of children: Not on file   Years of education: Not on file   Highest education level: Not on file  Occupational History   Not on file   Tobacco Use   Smoking status: Never   Smokeless tobacco: Never  Vaping Use   Vaping Use: Never used  Substance and Sexual Activity   Alcohol use: Yes    Comment: very rare   Drug use: No   Sexual activity: Yes    Birth control/protection: Surgical    Comment: ablation  Other Topics Concern   Not on file  Social History Narrative   Not on file   Social Determinants of Health   Financial Resource Strain: Not on file  Food Insecurity: Not on file  Transportation Needs: Not on file  Physical Activity: Not on file  Stress: Not on file  Social Connections: Not on file  Intimate Partner Violence: Not on file   Past Surgical History:  Procedure Laterality Date   AXILLARY LYMPH NODE DISSECTION Left 08/16/2014   Procedure: AXILLARY LYMPH NODE DISSECTION;  Surgeon: Coralie Keens, MD;  Location: [place];  Service: General;  Laterality: Left;   bilateral  tubal ligation  05/08/2004   BREAST LUMPECTOMY WITH RADIOACTIVE SEED AND SENTINEL LYMPH NODE BIOPSY Left 08/08/2014   Procedure: BREAST LUMPECTOMY WITH RADIOACTIVE SEED AND SENTINEL LYMPH NODE BIOPSY;  Surgeon: Coralie Keens, MD;  Location: [place];  Service: General;  Laterality: Left;   CHOLECYSTECTOMY  12/30/2016   COLONOSCOPY     MASS EXCISION Left 01/08/2018   Procedure: EXCISION MUCOID CYST, DEBRIDEMENT DISTAL INTERPHALANGEAL LEFT MIDDLE FINGER;  Surgeon: Daryll Brod, MD;  Location:  [place];  Service: Orthopedics;  Laterality: Left;   MASTOPEXY Right 12/14/2015   Procedure: RIGHT BREAST MASTOPEXY;  Surgeon: Irene Limbo, MD;  Location: [place];  Service: Plastics;  Laterality: Right;   PORT-A-CATH REMOVAL N/A 03/08/2015   Procedure: REMOVAL PORT-A-CATH;  Surgeon: Coralie Keens, MD;  Location: [facility];  Service: General;  Laterality: N/A;   PORTACATH PLACEMENT Right 08/16/2014   Procedure: INSERTION PORT-A-CATH;  Surgeon: Coralie Keens, MD;  Location:  [place];  Service: General;  Laterality: Right;   RE-EXCISION OF BREAST LUMPECTOMY Left 08/16/2014   Procedure: RE-EXCISION OF LEFT BREAST CANCER AND PORT PLACEMENT/POSSIBLE AXILLARY DISECTION;  Surgeon: Coralie Keens, MD;  Location: [place];  Service: General;  Laterality: Left;   right breast surg     [facility] Right 07/28/2012   Procedure: RIGHT SHOULDER DECOMPRESSION AND EXCISION OF CALCIFIC DEPOSIT;  Surgeon: Tobi Bastos, MD;  Location: [facility];  Service: Orthopedics;  Laterality: Right;   TUBAL LIGATION     uterine ablation  02/2006    Due to heavy periods Dr. Jon Billings    wisdom teeth extractions  yrs ago   Family History  Problem Relation Age of Onset   Cirrhosis Father    Colon polyps Mother    Colon cancer Neg Hx    Esophageal cancer Neg Hx    Rectal cancer Neg Hx    Stomach cancer Neg Hx     Current Outpatient Medications:    acetaminophen (TYLENOL) 325 MG tablet, Take 975 mg by mouth daily as needed for moderate pain. Usually takes twice daily in morning and bedtime and additionally during the day if needed for pain, Disp: , Rfl:    alendronate (FOSAMAX) 70 MG tablet, TAKE 1 TABLET ONCE A WEEK. TAKE WITH A FULL GLASS OF WATER ON AN EMPTY STOMACH., Disp: 4 tablet, Rfl: 0   anastrozole (ARIMIDEX) 1 MG tablet, TAKE 1 TABLET BY MOUTH EVERY DAY, Disp: 30 tablet, Rfl: 11   Biotin 1000 MCG tablet, Take 1,000 mcg by mouth daily., Disp: , Rfl:    CALCIUM PO, Take 1 tablet by mouth daily., Disp: , Rfl:    clobetasol cream (TEMOVATE) 0.05 %, Apply topically 2 (two) times daily as needed., Disp: , Rfl:    clonazePAM (KLONOPIN) 0.5 MG tablet, Take 1 tablet (0.5 mg total) by mouth 3 (three) times daily as needed (put on file)., Disp: 90 tablet, Rfl: 5   donepezil (ARICEPT) 10 MG tablet, Take 1 tablet (10 mg total) by mouth at bedtime. (NEEDS TO BE SEEN BEFORE NEXT REFILL), Disp: 31 tablet, Rfl: 0   furosemide  (LASIX) 20 MG tablet, TAKE 1 TABLET BY MOUTH EVERY DAY, Disp: 30 tablet, Rfl: 3   meclizine (ANTIVERT) 25 MG tablet, TAKE 1 TABLET (25 MG TOTAL) BY MOUTH 3 (THREE) TIMES DAILY AS NEEDED FOR DIZZINESS., Disp: 30 tablet, Rfl: 1   PARoxetine (PAXIL-CR) 25 MG 24 hr tablet, Take 1 tablet (25 mg total) by mouth daily., Disp: 30 tablet, Rfl: 11   potassium chloride (KLOR-CON) 10 MEQ tablet, TAKE 1 TABLET BY MOUTH EVERY DAY, Disp: 30 tablet, Rfl: 3   rosuvastatin (CRESTOR) 10 MG tablet, Take 1 tablet (10 mg total) by mouth daily., Disp: 30 tablet, Rfl: 12   triamcinolone (KENALOG) 0.1 %, Apply topically 2 (two) times daily., Disp: , Rfl:    Turmeric 500 MG TABS, Take 1 tablet by mouth daily., Disp: ,  Rfl:    Vitamin D, Ergocalciferol, (DRISDOL) 1.25 MG (50000 UNIT) CAPS capsule, TAKE 1 CAPSULE (50,000 UNITS TOTAL) BY MOUTH EVERY 7 (SEVEN) DAYS., Disp: 12 capsule, Rfl: 3  Allergies  Allergen Reactions   Statins Other (See Comments)   Crestor [Rosuvastatin] Other (See Comments)    myalgias   Lipitor [Atorvastatin] Other (See Comments)    myalgias   Temazepam Other (See Comments)    Pt does not remember (Restoril)     ROS: Review of Systems Pertinent items noted in HPI and remainder of comprehensive ROS otherwise negative.    Physical exam BP 134/85   Pulse 67   Temp 97.9 F (36.6 C)   Ht 5' 5"  (1.651 m)   Wt 158 lb 3.2 oz (71.8 kg)   SpO2 96%   BMI 26.33 kg/m  General appearance: alert, cooperative, appears stated age, and no distress Head: Normocephalic, without obvious abnormality, atraumatic Eyes: negative findings: lids and lashes normal, conjunctivae and sclerae normal, corneas clear, and pupils equal, round, reactive to light and accomodation Ears: normal TM's and external ear canals both ears Nose: Nares normal. Septum midline. Mucosa normal. No drainage or sinus tenderness. Throat: lips, mucosa, and tongue normal; teeth and gums normal Neck: no adenopathy, supple, symmetrical,  trachea midline, and thyroid not enlarged, symmetric, no tenderness/mass/nodules Back: symmetric, no curvature. ROM normal. No CVA tenderness. Lungs: clear to auscultation bilaterally Heart: regular rate and rhythm, S1, S2 normal, no murmur, click, rub or gallop Abdomen: soft, non-tender; bowel sounds normal; no masses,  no organomegaly Extremities: extremities normal, atraumatic, no cyanosis or edema Pulses: 2+ and symmetric Skin:  Postinflammatory hyperpigmentation noted along the distal lateral ankle and dorsum of the foot.  This appears to be patchy and thickened skin consistent with psoriatic plaque Lymph nodes: Cervical, supraclavicular, and axillary nodes normal. Neurologic: Alert and oriented X 3, normal strength and tone. Normal symmetric reflexes. Normal coordination and gait Psych: Mood stable, speech normal, affect appropriate Depression screen [facility] 2/9 08/22/2020 03/06/2020 09/05/2019  Decreased Interest 0 0 0  Down, Depressed, Hopeless 0 0 0  PHQ - 2 Score 0 0 0  Altered sleeping 1 0 -  Tired, decreased energy 0 0 -  Change in appetite 0 0 -  Feeling bad or failure about yourself  0 0 -  Trouble concentrating 0 0 -  Moving slowly or fidgety/restless 0 0 -  Suicidal thoughts 0 0 -  PHQ-9 Score 1 0 -  Difficult doing work/chores Not difficult at all - -  Some recent data might be hidden      Assessment/ Plan: Sima Matas here for annual physical exam.   Annual physical exam  Vitamin D deficiency - Plan: Vitamin D, Ergocalciferol, (DRISDOL) 1.25 MG (50000 UNIT) CAPS capsule, VITAMIN D 25 Hydroxy (Vit-D Deficiency, Fractures)  Meniere's disease of right ear - Plan: clonazePAM (KLONOPIN) 0.5 MG tablet  Malignant neoplasm of upper-outer quadrant of left breast in female, estrogen receptor positive ([place]) - Plan: CBC, Fecal occult blood, imunochemical  Pure hypercholesterolemia - Plan: CMP14+EGFR, Lipid Panel  Other osteoporosis without current pathological fracture  - Plan: VITAMIN D 25 Hydroxy (Vit-D Deficiency, Fractures), alendronate (FOSAMAX) 70 MG tablet  Elevated serum glucose - Plan: Bayer DCA Hb A1c Waived  Screen for colon cancer - Plan: Fecal occult blood, imunochemical  Check vitamin D level.  Vitamin D has been prescribed to patient.  Mnire's is under good control with daily use of clonazepam.  National narcotic database was reviewed and  there were no red flags.  Check CBC, FOBT given history of breast cancer currently treated with aromatase inhibitor  Check fasting lipid panel, CMP  Discussed consideration for Prolia given history of breast cancer treated with aromatase inhibitor.  This is shown to reduce recurrence in some patients  Check A1c given elevated serum glucose  Counseled on healthy lifestyle choices, including diet (rich in fruits, vegetables and lean meats and low in salt and simple carbohydrates) and exercise (at least 30 minutes of moderate physical activity daily).  Patient to follow up in 6 m for meniere's.  She will return in the next week or so to have pneumococcal and shingles vaccine administered  Pap obtained and will be scanned into the chart  Jahni Nazar M. Lajuana Ripple, DO

## 2020-08-22 NOTE — Patient Instructions (Signed)
You had labs performed today.  You will be contacted with the results of the labs once they are available, usually in the next 3 business days for routine lab work.  If you have an active my chart account, they will be released to your MyChart.  If you prefer to have these labs released to you via telephone, please let us know.  If you had a pap smear or biopsy performed, expect to be contacted in about 7-10 days. Preventive Care 40-58 Years Old, Female Preventive care refers to lifestyle choices and visits with your health care provider that can promote health and wellness. This includes: A yearly physical exam. This is also called an annual wellness visit. Regular dental and eye exams. Immunizations. Screening for certain conditions. Healthy lifestyle choices, such as: Eating a healthy diet. Getting regular exercise. Not using drugs or products that contain nicotine and tobacco. Limiting alcohol use. What can I expect for my preventive care visit? Physical exam Your health care provider will check your: Height and weight. These may be used to calculate your BMI (body mass index). BMI is a measurement that tells if you are at a healthy weight. Heart rate and blood pressure. Body temperature. Skin for abnormal spots. Counseling Your health care provider may ask you questions about your: Past medical problems. Family's medical history. Alcohol, tobacco, and drug use. Emotional well-being. Home life and relationship well-being. Sexual activity. Diet, exercise, and sleep habits. Work and work environment. Access to firearms. Method of birth control. Menstrual cycle. Pregnancy history. What immunizations do I need?  Vaccines are usually given at various ages, according to a schedule. Your health care provider will recommend vaccines for you based on your age, medicalhistory, and lifestyle or other factors, such as travel or where you work. What tests do I need? Blood tests Lipid  and cholesterol levels. These may be checked every 5 years, or more often if you are over 50 years old. Hepatitis C test. Hepatitis B test. Screening Lung cancer screening. You may have this screening every year starting at age 55 if you have a 30-pack-year history of smoking and currently smoke or have quit within the past 15 years. Colorectal cancer screening. All adults should have this screening starting at age 50 and continuing until age 75. Your health care provider may recommend screening at age 45 if you are at increased risk. You will have tests every 1-10 years, depending on your results and the type of screening test. Diabetes screening. This is done by checking your blood sugar (glucose) after you have not eaten for a while (fasting). You may have this done every 1-3 years. Mammogram. This may be done every 1-2 years. Talk with your health care provider about when you should start having regular mammograms. This may depend on whether you have a family history of breast cancer. BRCA-related cancer screening. This may be done if you have a family history of breast, ovarian, tubal, or peritoneal cancers. Pelvic exam and Pap test. This may be done every 3 years starting at age 21. Starting at age 30, this may be done every 5 years if you have a Pap test in combination with an HPV test. Other tests STD (sexually transmitted disease) testing, if you are at risk. Bone density scan. This is done to screen for osteoporosis. You may have this scan if you are at high risk for osteoporosis. Talk with your health care provider about your test results, treatment options,and if necessary, the need for   more tests. Follow these instructions at home: Eating and drinking  Eat a diet that includes fresh fruits and vegetables, whole grains, lean protein, and low-fat dairy products. Take vitamin and mineral supplements as recommended by your health care provider. Do not drink alcohol if: Your  health care provider tells you not to drink. You are pregnant, may be pregnant, or are planning to become pregnant. If you drink alcohol: Limit how much you have to 0-1 drink a day. Be aware of how much alcohol is in your drink. In the [place] one drink equals one 12 oz bottle of beer (355 mL), one 5 oz glass of wine (148 mL), or one 1 oz glass of hard liquor (44 mL).  Lifestyle Take daily care of your teeth and gums. Brush your teeth every morning and night with fluoride toothpaste. Floss one time each day. Stay active. Exercise for at least 30 minutes 5 or more days each week. Do not use any products that contain nicotine or tobacco, such as cigarettes, e-cigarettes, and chewing tobacco. If you need help quitting, ask your health care provider. Do not use drugs. If you are sexually active, practice safe sex. Use a condom or other form of protection to prevent STIs (sexually transmitted infections). If you do not wish to become pregnant, use a form of birth control. If you plan to become pregnant, see your health care provider for a prepregnancy visit. If told by your health care provider, take low-dose aspirin daily starting at age 50. Find healthy ways to cope with stress, such as: Meditation, yoga, or listening to music. Journaling. Talking to a trusted person. Spending time with friends and family. Safety Always wear your seat belt while driving or riding in a vehicle. Do not drive: If you have been drinking alcohol. Do not ride with someone who has been drinking. When you are tired or distracted. While texting. Wear a helmet and other protective equipment during sports activities. If you have firearms in your house, make sure you follow all gun safety procedures. What's next? Visit your health care provider once a year for an annual wellness visit. Ask your health care provider how often you should have your eyes and teeth checked. Stay up to date on all vaccines. This information  is not intended to replace advice given to you by your health care provider. Make sure you discuss any questions you have with your healthcare provider. Document Revised: 11/29/2019 Document Reviewed: 11/05/2017 Elsevier Patient Education  2022 Elsevier Inc.  

## 2020-08-23 ENCOUNTER — Encounter: Payer: Self-pay | Admitting: Hematology and Oncology

## 2020-08-23 LAB — CMP14+EGFR
ALT: 16 IU/L (ref 0–32)
AST: 16 IU/L (ref 0–40)
Albumin/Globulin Ratio: 2.4 — ABNORMAL HIGH (ref 1.2–2.2)
Albumin: 4.7 g/dL (ref 3.8–4.9)
Alkaline Phosphatase: 63 IU/L (ref 44–121)
BUN/Creatinine Ratio: 15 (ref 9–23)
BUN: 9 mg/dL (ref 6–24)
Bilirubin Total: 0.4 mg/dL (ref 0.0–1.2)
CO2: 22 mmol/L (ref 20–29)
Calcium: 8.9 mg/dL (ref 8.7–10.2)
Chloride: 106 mmol/L (ref 96–106)
Creatinine, Ser: 0.59 mg/dL (ref 0.57–1.00)
Globulin, Total: 2 g/dL (ref 1.5–4.5)
Glucose: 101 mg/dL — ABNORMAL HIGH (ref 65–99)
Potassium: 3.8 mmol/L (ref 3.5–5.2)
Sodium: 142 mmol/L (ref 134–144)
Total Protein: 6.7 g/dL (ref 6.0–8.5)
eGFR: 104 mL/min/{1.73_m2} (ref >59)

## 2020-08-23 LAB — CBC
Hematocrit: 42 % (ref 34.0–46.6)
Hemoglobin: 14.2 g/dL (ref 11.1–15.9)
MCH: 32.1 pg (ref 26.6–33.0)
MCHC: 33.8 g/dL (ref 31.5–35.7)
MCV: 95 fL (ref 79–97)
Platelets: 208 10*3/uL (ref 150–450)
RBC: 4.43 x10E6/uL (ref 3.77–5.28)
RDW: 12.1 % (ref 11.7–15.4)
WBC: 4 10*3/uL (ref 3.4–10.8)

## 2020-08-23 LAB — VITAMIN D 25 HYDROXY (VIT D DEFICIENCY, FRACTURES): Vit D, 25-Hydroxy: 69.6 ng/mL (ref 30.0–100.0)

## 2020-08-23 LAB — LIPID PANEL
Chol/HDL Ratio: 2.9 ratio (ref 0.0–4.4)
Cholesterol, Total: 158 mg/dL (ref 100–199)
HDL: 55 mg/dL (ref >39)
LDL Chol Calc (NIH): 82 mg/dL (ref 0–99)
Triglycerides: 116 mg/dL (ref 0–149)
VLDL Cholesterol Cal: 21 mg/dL (ref 5–40)

## 2020-08-24 ENCOUNTER — Encounter: Payer: PRIVATE HEALTH INSURANCE | Admitting: Family Medicine

## 2020-09-05 ENCOUNTER — Ambulatory Visit (INDEPENDENT_AMBULATORY_CARE_PROVIDER_SITE_OTHER): Payer: PRIVATE HEALTH INSURANCE | Admitting: Family Medicine

## 2020-09-05 ENCOUNTER — Encounter: Payer: Self-pay | Admitting: Family Medicine

## 2020-09-05 ENCOUNTER — Other Ambulatory Visit: Payer: Self-pay

## 2020-09-05 VITALS — BP 134/80 | HR 69 | Temp 98.0°F | Ht 65.0 in | Wt 158.2 lb

## 2020-09-05 DIAGNOSIS — S40862A Insect bite (nonvenomous) of left upper arm, initial encounter: Secondary | ICD-10-CM

## 2020-09-05 DIAGNOSIS — W57XXXA Bitten or stung by nonvenomous insect and other nonvenomous arthropods, initial encounter: Secondary | ICD-10-CM | POA: Diagnosis not present

## 2020-09-05 MED ORDER — CEPHALEXIN 500 MG PO CAPS: 500.0000 mg | ORAL_CAPSULE | Freq: Two times a day (BID) | ORAL | 0 refills | Status: AC

## 2020-09-05 MED ORDER — TRIAMCINOLONE ACETONIDE 0.1 % EX CREA: TOPICAL_CREAM | Freq: Two times a day (BID) | CUTANEOUS | 0 refills | Status: DC

## 2020-09-05 NOTE — Patient Instructions (Signed)
Antihistamine - Benadryl, Zyrtec, Claritin, Allegra, or Xyzal Triamcinolone cream twice daily Cool compresses May start antibiotic in 2 days if there is no improvement.

## 2020-09-05 NOTE — Progress Notes (Signed)
Assessment & Plan:  1. Insect bite of left upper arm, initial encounter Education provided on insect bites. Apply triamcinolone cream twice daily. Take an antihistamine. Cool compresses. May get prescription for Keflex in two days if their has been no improvement in reaction.  - triamcinolone cream (KENALOG) 0.1 %; Apply topically 2 (two) times daily.  Dispense: 80 g; Refill: 0 - cephALEXin (KEFLEX) 500 MG capsule; Take 1 capsule (500 mg total) by mouth 2 (two) times daily for 7 days.  Dispense: 14 capsule; Refill: 0   Follow up plan: Return if symptoms worsen or fail to improve.  Hendricks Limes, MSN, APRN, FNP-C [facility]  Subjective:   Patient ID: JAQUAYLA HEGE, female    DOB: January 09, 1961, 60 y.o.   MRN: [id number]  HPI: JAYLEY HUSTEAD is a 60 y.o. female presenting on 09/05/2020 for Insect Bite (Patient states she got bite by an insect yesterday at her pool on her left arm and left lower leg. )  Patient reports she got bitten by something at the pool yesterday on her left upper arm and left thigh. Initially the area welted up. She has been applying Benadryl cream. She is worried because she had lymph nodes removed in these areas and does not want an infection to get in her blood stream.    ROS: Negative unless specifically indicated above in HPI.   Relevant past medical history reviewed and updated as indicated.   Allergies and medications reviewed and updated.   Current Outpatient Medications:    acetaminophen (TYLENOL) 325 MG tablet, Take 975 mg by mouth daily as needed for moderate pain. Usually takes twice daily in morning and bedtime and additionally during the day if needed for pain, Disp: , Rfl:    alendronate (FOSAMAX) 70 MG tablet, TAKE 1 TABLET ONCE A WEEK. TAKE WITH A FULL GLASS OF WATER ON AN EMPTY STOMACH., Disp: 12 tablet, Rfl: 3   anastrozole (ARIMIDEX) 1 MG tablet, TAKE 1 TABLET BY MOUTH EVERY DAY, Disp: 30 tablet, Rfl: 11   Biotin 1000  MCG tablet, Take 1,000 mcg by mouth daily., Disp: , Rfl:    CALCIUM PO, Take 1 tablet by mouth daily., Disp: , Rfl:    clobetasol cream (TEMOVATE) 0.05 %, Apply topically 2 (two) times daily as needed., Disp: , Rfl:    clonazePAM (KLONOPIN) 0.5 MG tablet, Take 1 tablet (0.5 mg total) by mouth 3 (three) times daily as needed (put on file)., Disp: 90 tablet, Rfl: 5   donepezil (ARICEPT) 10 MG tablet, Take 1 tablet (10 mg total) by mouth at bedtime., Disp: 31 tablet, Rfl: 12   furosemide (LASIX) 20 MG tablet, TAKE 1 TABLET BY MOUTH EVERY DAY, Disp: 30 tablet, Rfl: 3   meclizine (ANTIVERT) 25 MG tablet, TAKE 1 TABLET (25 MG TOTAL) BY MOUTH 3 (THREE) TIMES DAILY AS NEEDED FOR DIZZINESS., Disp: 30 tablet, Rfl: 1   PARoxetine (PAXIL-CR) 25 MG 24 hr tablet, Take 1 tablet (25 mg total) by mouth daily., Disp: 30 tablet, Rfl: 11   potassium chloride (KLOR-CON) 10 MEQ tablet, TAKE 1 TABLET BY MOUTH EVERY DAY, Disp: 30 tablet, Rfl: 3   rosuvastatin (CRESTOR) 10 MG tablet, Take 1 tablet (10 mg total) by mouth daily., Disp: 30 tablet, Rfl: 12   triamcinolone (KENALOG) 0.1 %, Apply topically 2 (two) times daily., Disp: , Rfl:    Turmeric 500 MG TABS, Take 1 tablet by mouth daily., Disp: , Rfl:    Vitamin D, Ergocalciferol, (DRISDOL)  1.25 MG (50000 UNIT) CAPS capsule, TAKE 1 CAPSULE (50,000 UNITS TOTAL) BY MOUTH EVERY 7 (SEVEN) DAYS., Disp: 12 capsule, Rfl: 3  Allergies  Allergen Reactions   Statins Other (See Comments)   Crestor [Rosuvastatin] Other (See Comments)    myalgias   Lipitor [Atorvastatin] Other (See Comments)    myalgias   Temazepam Other (See Comments)    Pt does not remember (Restoril)    Objective:   BP 134/80   Pulse 69   Temp 98 F (36.7 C) (Temporal)   Ht 5\' 5"  (1.651 m)   Wt 158 lb 3.2 oz (71.8 kg)   SpO2 98%   BMI 26.33 kg/m    Physical Exam Vitals reviewed.  Constitutional:      General: She is not in acute distress.    Appearance: Normal appearance. She is not  ill-appearing, toxic-appearing or diaphoretic.  HENT:     Head: Normocephalic and atraumatic.  Eyes:     General: No scleral icterus.       Right eye: No discharge.        Left eye: No discharge.     Conjunctiva/sclera: Conjunctivae normal.  Cardiovascular:     Rate and Rhythm: Normal rate.  Pulmonary:     Effort: Pulmonary effort is normal. No respiratory distress.  Musculoskeletal:        General: Normal range of motion.     Cervical back: Normal range of motion.  Skin:    General: Skin is warm and dry.     Capillary Refill: Capillary refill takes less than 2 seconds.     Findings: Erythema (to left upper arm and left thigh without swelling) present.  Neurological:     General: No focal deficit present.     Mental Status: She is alert and oriented to person, place, and time. Mental status is at baseline.  Psychiatric:        Mood and Affect: Mood normal.        Behavior: Behavior normal.        Thought Content: Thought content normal.        Judgment: Judgment normal.

## 2020-10-15 ENCOUNTER — Other Ambulatory Visit: Payer: Self-pay | Admitting: Family Medicine

## 2020-10-15 DIAGNOSIS — S40862A Insect bite (nonvenomous) of left upper arm, initial encounter: Secondary | ICD-10-CM

## 2020-10-15 DIAGNOSIS — W57XXXA Bitten or stung by nonvenomous insect and other nonvenomous arthropods, initial encounter: Secondary | ICD-10-CM

## 2020-10-16 MED ORDER — TRIAMCINOLONE ACETONIDE 0.1 % EX CREA
TOPICAL_CREAM | Freq: Two times a day (BID) | CUTANEOUS | 0 refills | Status: DC
Start: 2020-10-16 — End: 2021-04-02

## 2020-10-16 NOTE — Telephone Encounter (Signed)
Refill failed. resent °

## 2020-10-16 NOTE — Addendum Note (Signed)
Addended by: Antonietta Barcelona D on: 10/16/2020 10:02 AM   Modules accepted: Orders

## 2020-12-18 ENCOUNTER — Other Ambulatory Visit: Payer: Self-pay | Admitting: *Deleted

## 2020-12-18 DIAGNOSIS — C50412 Malignant neoplasm of upper-outer quadrant of left female breast: Secondary | ICD-10-CM

## 2020-12-25 NOTE — Progress Notes (Signed)
Patient Care Team: [name], DO as PCP - General (Family Medicine) Nicholas Lose, MD as Consulting Physician (Hematology and Oncology) Coralie Keens, MD as Consulting Physician (General Surgery) Kyung Rudd, MD as Consulting Physician (Radiation Oncology) Sylvan Cheese, NP as Nurse Practitioner (Hematology and Oncology)  DIAGNOSIS:    ICD-10-CM   1. Malignant neoplasm of upper-outer quadrant of left breast in female, estrogen receptor positive ([place])  C50.412    Z17.0       SUMMARY OF ONCOLOGIC HISTORY: Oncology History  Malignant neoplasm of upper-outer quadrant of left breast in female, estrogen receptor positive ([place])  07/18/2014 Mammogram   Distortion left breast, breast density category C; U/S 1.3 x 0.8 x 0.8 cm left breast mass at 2:00 position 4 cm from nipple, no lymph nodes   07/20/2014 Initial Diagnosis   Left breast Biopsy: Invasive lobular cancer with LCIS, Grade 1, ER 86%, PR 78%, Her 2 Neg Ratio 1.77, Ki 67: 14%   08/01/2014 Breast MRI   Breast MRI showed non-mass enhancement 3.9 cm, no lymph nodes   08/01/2014 Clinical Stage   Stage IIA: T2 N0   08/08/2014 Surgery   Left breast lumpectomy: 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, with LCIS, medial and inferior margins positive, 3/4 lymph nodes positive   08/08/2014 Pathologic Stage   Stage IIB: T2 N1c M0   08/16/2014 Surgery   Left breast medial margin reexcision residual ILC 0.2 cm; inferior margin residual foci less than 0.2 cm, 1/5 lymph nodes positive, 1 lymph node with isolated tumor cells (Overall 4/10)   09/04/2014 - 01/15/2015 Chemotherapy   Adjuvant chemotherapy with dose dense Adriamycin and Cytoxan 4 followed by Abraxane weekly 8 ( stopped early for profound neutropenia and thrombocytopenia)   01/29/2015 - 03/20/2015 Radiation Therapy   Adjuvant XRT [facility]): 50.4 Gy over 28 fractions; seroma boost: 10 Gy over 5 fractions. Total dose: 60.4 Gy   04/11/2015 -  Anti-estrogen oral  therapy   Anastrozole 1 mg daily (PALLAS clinical trial 05/24/2015 patient was randomized to hormone therapy alone)   05/24/2015 Survivorship   Survivorship visit completed and copy of care plan given to patient   11/26/2016 PET scan   Small Left sided level 2 LN with hypermetabolism, post surg changes in breast and small right groin lymph node likely infectious/inflammatory, gallstones     CHIEF COMPLIANT: Follow-up of left breast cancer on anastrozole  INTERVAL HISTORY: [name] is a 60 y.o. with above-mentioned history of left breast cancer treated with lumpectomy, adjuvant chemotherapy, and radiation who is currently on oral antiestrogen therapy with anastrozole. She is a participant in the [place] and ABC clinical trials. She presents to the clinic today for follow-up.  Her major issues today are her weight gain of 10 pounds in 6 months and concerns regarding her risk for coronary artery disease given her extensive family history.  She does have joint aches and stiffness related to anastrozole.  She has not been exercising lately.  ALLERGIES:  is allergic to statins, crestor [rosuvastatin], lipitor [atorvastatin], and temazepam.  MEDICATIONS:  Current Outpatient Medications  Medication Sig Dispense Refill   acetaminophen (TYLENOL) 325 MG tablet Take 975 mg by mouth daily as needed for moderate pain. Usually takes twice daily in morning and bedtime and additionally during the day if needed for pain     alendronate (FOSAMAX) 70 MG tablet TAKE 1 TABLET ONCE A WEEK. TAKE WITH A FULL GLASS OF WATER ON AN EMPTY STOMACH. 12 tablet 3  anastrozole (ARIMIDEX) 1 MG tablet TAKE 1 TABLET BY MOUTH EVERY DAY 30 tablet 11   Biotin 1000 MCG tablet Take 1,000 mcg by mouth daily.     CALCIUM PO Take 1 tablet by mouth daily.     clobetasol cream (TEMOVATE) 0.05 % Apply topically 2 (two) times daily as needed.     clonazePAM (KLONOPIN) 0.5 MG tablet Take 1 tablet (0.5 mg total) by mouth 3 (three)  times daily as needed (put on file). 90 tablet 5   donepezil (ARICEPT) 10 MG tablet Take 1 tablet (10 mg total) by mouth at bedtime. 31 tablet 12   furosemide (LASIX) 20 MG tablet TAKE 1 TABLET BY MOUTH EVERY DAY 30 tablet 3   meclizine (ANTIVERT) 25 MG tablet TAKE 1 TABLET (25 MG TOTAL) BY MOUTH 3 (THREE) TIMES DAILY AS NEEDED FOR DIZZINESS. 30 tablet 1   PARoxetine (PAXIL-CR) 25 MG 24 hr tablet Take 1 tablet (25 mg total) by mouth daily. 30 tablet 11   potassium chloride (KLOR-CON) 10 MEQ tablet TAKE 1 TABLET BY MOUTH EVERY DAY 30 tablet 3   rosuvastatin (CRESTOR) 10 MG tablet Take 1 tablet (10 mg total) by mouth daily. 30 tablet 12   triamcinolone cream (KENALOG) 0.1 % Apply topically 2 (two) times daily. 60 g 0   Turmeric 500 MG TABS Take 1 tablet by mouth daily.     Vitamin D, Ergocalciferol, (DRISDOL) 1.25 MG (50000 UNIT) CAPS capsule TAKE 1 CAPSULE (50,000 UNITS TOTAL) BY MOUTH EVERY 7 (SEVEN) DAYS. 12 capsule 3   No current facility-administered medications for this visit.    PHYSICAL EXAMINATION: ECOG PERFORMANCE STATUS: 1 - Symptomatic but completely ambulatory  Vitals:   12/26/20 0758 12/26/20 0759  BP: (!) 152/76 140/75  Pulse: 67   Resp: 18   Temp: 98.1 F (36.7 C)   SpO2: 96%    Filed Weights   12/26/20 0758  Weight: 167 lb 1.6 oz (75.8 kg)    BREAST: No palpable masses or nodules in either right or left breasts. No palpable axillary supraclavicular or infraclavicular adenopathy no breast tenderness or nipple discharge. (exam performed in the presence of a chaperone)  LABORATORY DATA:  I have reviewed the data as listed CMP Latest Ref Rng & Units 08/22/2020 06/20/2020 12/28/2019  Glucose 65 - 99 mg/dL 101(H) 109(H) 101(H)  BUN 6 - 24 mg/dL 9 8 15   Creatinine 0.57 - 1.00 mg/dL 0.59 0.63 0.66  Sodium 134 - 144 mmol/L 142 142 141  Potassium 3.5 - 5.2 mmol/L 3.8 3.7 3.5  Chloride 96 - 106 mmol/L 106 106 106  CO2 20 - 29 mmol/L 22 25 28   Calcium 8.7 - 10.2 mg/dL 8.9  8.6(L) 9.3  Total Protein 6.0 - 8.5 g/dL 6.7 6.8 6.9  Total Bilirubin 0.0 - 1.2 mg/dL 0.4 0.9 0.8  Alkaline Phos 44 - 121 IU/L 63 56 64  AST 0 - 40 IU/L 16 18 18   ALT 0 - 32 IU/L 16 19 21     Lab Results  Component Value Date   WBC 4.6 12/26/2020   HGB 13.6 12/26/2020   HCT 39.5 12/26/2020   MCV 92.9 12/26/2020   PLT 203 12/26/2020   NEUTROABS 2.6 12/26/2020    ASSESSMENT & PLAN:  Malignant neoplasm of upper-outer quadrant of left breast in female, estrogen receptor positive ([place]) Left breast lumpectomy 08/08/2014: 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 Adjuvant chemotherapy with dose dense Adriamycin and Cytoxan x 4 followed by Abraxane weekly 12 because of lymph node positive disease. Started 09/04/14 to 01/15/15 2. adjuvant radiation completed 03/20/15 3. Followed by antiestrogen therapy started 04/03/15 -------------------------------------------------------------------------------------------------------------------------- Anastrozole toxicities: Patient denies any hot flashes. Weight gain Arthralgias and myalgias    PALLAS Trial : Patient has been randomized to anastrozole alone. [facility] clinical trial: The trial randomizes between aspirin versus placebo. She went on trial 07/26/2015 Currently on 100 mg of dosage.  No adverse effects from participating in the trial.   Adverse effects: 1. Bruising/ Hematoma:  Currently taking 100 mg of aspirin/placebo: Mild 2. Osteoporosis: Fosamax weekly, DEXA scan 07/28/2019: T score -2.1 (improved from -2.5)   Cognitive dysfunction: On Aricept 10 mg daily, followed by PCP,  doing much better Coronary artery risk: Because of her extensive family history, I recommended that she undergo coronary calcium scoring CT scan.   Weight gain: She is going to go back on her diet.   Breast cancer surveillance:  1. Mammogram 07/31/2020 benign 2. Breast exam 06/20/20: No palpable lumps or nodules of concern.  3. CT CAP 10/28/2017: No evidence of malignancy, radiation changes anterior left upper lobe    Bone density 07/26/2015: T score -1.9, osteopenia Bone density: 07/28/19 T score -2.1 currently on Fosamax   Her daughter is going to early [place] and she is very proud of her.  She is hoping to go into movies/TV production. No clinical evidence of breast cancer recurrence. Return to clinic in 6 months for follow-up    No orders of the defined types were placed in this encounter.  The patient has a good understanding of the overall plan. she agrees with it. she will call with any problems that may develop before the next visit here.  Total time spent: 30 mins including face to face time and time spent for planning, charting and coordination of care  Rulon Eisenmenger, MD, MPH 12/26/2020  I, Thana Ates, am acting as scribe for Dr. Nicholas Lose.  I have reviewed the above documentation for accuracy and completeness, and I agree with the above.

## 2020-12-26 ENCOUNTER — Other Ambulatory Visit: Payer: Self-pay

## 2020-12-26 ENCOUNTER — Inpatient Hospital Stay (HOSPITAL_BASED_OUTPATIENT_CLINIC_OR_DEPARTMENT_OTHER): Payer: 59 | Admitting: Hematology and Oncology

## 2020-12-26 ENCOUNTER — Inpatient Hospital Stay: Payer: 59 | Attending: Hematology and Oncology

## 2020-12-26 ENCOUNTER — Inpatient Hospital Stay: Payer: 59

## 2020-12-26 DIAGNOSIS — Z170 Estrogen receptor positive status [ER+]: Secondary | ICD-10-CM

## 2020-12-26 DIAGNOSIS — M8580 Other specified disorders of bone density and structure, unspecified site: Secondary | ICD-10-CM | POA: Insufficient documentation

## 2020-12-26 DIAGNOSIS — C50412 Malignant neoplasm of upper-outer quadrant of left female breast: Secondary | ICD-10-CM | POA: Insufficient documentation

## 2020-12-26 DIAGNOSIS — Z23 Encounter for immunization: Secondary | ICD-10-CM

## 2020-12-26 DIAGNOSIS — M2550 Pain in unspecified joint: Secondary | ICD-10-CM | POA: Diagnosis not present

## 2020-12-26 DIAGNOSIS — T451X5A Adverse effect of antineoplastic and immunosuppressive drugs, initial encounter: Secondary | ICD-10-CM | POA: Insufficient documentation

## 2020-12-26 DIAGNOSIS — Z923 Personal history of irradiation: Secondary | ICD-10-CM | POA: Diagnosis not present

## 2020-12-26 DIAGNOSIS — M791 Myalgia, unspecified site: Secondary | ICD-10-CM | POA: Diagnosis not present

## 2020-12-26 DIAGNOSIS — Z79811 Long term (current) use of aromatase inhibitors: Secondary | ICD-10-CM | POA: Diagnosis not present

## 2020-12-26 DIAGNOSIS — R635 Abnormal weight gain: Secondary | ICD-10-CM | POA: Diagnosis not present

## 2020-12-26 DIAGNOSIS — Z888 Allergy status to other drugs, medicaments and biological substances status: Secondary | ICD-10-CM | POA: Diagnosis not present

## 2020-12-26 DIAGNOSIS — M818 Other osteoporosis without current pathological fracture: Secondary | ICD-10-CM | POA: Diagnosis not present

## 2020-12-26 DIAGNOSIS — Z79899 Other long term (current) drug therapy: Secondary | ICD-10-CM | POA: Diagnosis not present

## 2020-12-26 DIAGNOSIS — R4189 Other symptoms and signs involving cognitive functions and awareness: Secondary | ICD-10-CM | POA: Insufficient documentation

## 2020-12-26 LAB — CBC WITH DIFFERENTIAL (CANCER CENTER ONLY)
Abs Immature Granulocytes: 0.01 10*3/uL (ref 0.00–0.07)
Basophils Absolute: 0 10*3/uL (ref 0.0–0.1)
Basophils Relative: 1 %
Eosinophils Absolute: 0.1 10*3/uL (ref 0.0–0.5)
Eosinophils Relative: 2 %
HCT: 39.5 % (ref 36.0–46.0)
Hemoglobin: 13.6 g/dL (ref 12.0–15.0)
Immature Granulocytes: 0 %
Lymphocytes Relative: 32 %
Lymphs Abs: 1.5 10*3/uL (ref 0.7–4.0)
MCH: 32 pg (ref 26.0–34.0)
MCHC: 34.4 g/dL (ref 30.0–36.0)
MCV: 92.9 fL (ref 80.0–100.0)
Monocytes Absolute: 0.4 10*3/uL (ref 0.1–1.0)
Monocytes Relative: 8 %
Neutro Abs: 2.6 10*3/uL (ref 1.7–7.7)
Neutrophils Relative %: 57 %
Platelet Count: 203 10*3/uL (ref 150–400)
RBC: 4.25 MIL/uL (ref 3.87–5.11)
RDW: 12.5 % (ref 11.5–15.5)
WBC Count: 4.6 10*3/uL (ref 4.0–10.5)
nRBC: 0 % (ref 0.0–0.2)

## 2020-12-26 LAB — CMP (CANCER CENTER ONLY)
ALT: 22 U/L (ref 0–44)
AST: 17 U/L (ref 15–41)
Albumin: 4.1 g/dL (ref 3.5–5.0)
Alkaline Phosphatase: 56 U/L (ref 38–126)
Anion gap: 12 (ref 5–15)
BUN: 11 mg/dL (ref 6–20)
CO2: 23 mmol/L (ref 22–32)
Calcium: 8.8 mg/dL — ABNORMAL LOW (ref 8.9–10.3)
Chloride: 107 mmol/L (ref 98–111)
Creatinine: 0.66 mg/dL (ref 0.44–1.00)
GFR, Estimated: >60 mL/min (ref >60)
Glucose, Bld: 103 mg/dL — ABNORMAL HIGH (ref 70–99)
Potassium: 3.7 mmol/L (ref 3.5–5.1)
Sodium: 142 mmol/L (ref 135–145)
Total Bilirubin: 0.8 mg/dL (ref 0.3–1.2)
Total Protein: 6.6 g/dL (ref 6.5–8.1)

## 2020-12-26 LAB — LIPID PANEL
Cholesterol: 150 mg/dL (ref 0–200)
HDL: 63 mg/dL (ref >40)
LDL Cholesterol: 70 mg/dL (ref 0–99)
Total CHOL/HDL Ratio: 2.4 RATIO
Triglycerides: 87 mg/dL (ref <150)
VLDL: 17 mg/dL (ref 0–40)

## 2020-12-26 LAB — HEMOGLOBIN A1C
Hgb A1c MFr Bld: 5.2 % (ref 4.8–5.6)
Mean Plasma Glucose: 102.54 mg/dL

## 2020-12-26 MED ORDER — INFLUENZA VAC SPLIT QUAD 0.5 ML IM SUSY
0.5000 mL | PREFILLED_SYRINGE | Freq: Once | INTRAMUSCULAR | Status: AC
  Administered 2020-12-26: 0.5 mL via INTRAMUSCULAR
  Filled 2020-12-26: qty 0.5

## 2020-12-26 MED ORDER — ANASTROZOLE 1 MG PO TABS: 1.0000 mg | ORAL_TABLET | Freq: Every day | ORAL | 3 refills | Status: DC

## 2020-12-26 NOTE — Patient Instructions (Signed)
Influenza, Adult °Influenza, also called "the flu," is a viral infection that mainly affects the respiratory tract. This includes the lungs, nose, and throat. The flu spreads easily from person to person (is contagious). It causes common cold symptoms, along with high fever and body aches. °What are the causes? °This condition is caused by the influenza virus. You can get the virus by: °Breathing in droplets that are in the air from an infected person's cough or sneeze. °Touching something that has the virus on it (has been contaminated) and then touching your mouth, nose, or eyes. °What increases the risk? °The following factors may make you more likely to get the flu: °Not washing or sanitizing your hands often. °Having close contact with many people during cold and flu season. °Touching your mouth, eyes, or nose without first washing or sanitizing your hands. °Not getting an annual flu shot. °You may have a higher risk for the flu, including serious problems, such as a lung infection (pneumonia), if you: °Are older than 65. °Are pregnant. °Have a weakened disease-fighting system (immune system). This includes people who have HIV or AIDS, are on chemotherapy, or are taking medicines that reduce (suppress) the immune system. °Have a long-term (chronic) illness, such as heart disease, kidney disease, diabetes, or lung disease. °Have a liver disorder. °Are severely overweight (morbidly obese). °Have anemia. °Have asthma. °What are the signs or symptoms? °Symptoms of this condition usually begin suddenly and last 4-14 days. These may include: °Fever and chills. °Headaches, body aches, or muscle aches. °Sore throat. °Cough. °Runny or stuffy (congested) nose. °Chest discomfort. °Poor appetite. °Weakness or fatigue. °Dizziness. °Nausea or vomiting. °How is this diagnosed? °This condition may be diagnosed based on: °Your symptoms and medical history. °A physical exam. °Swabbing your nose or throat and testing the fluid  for the influenza virus. °How is this treated? °If the flu is diagnosed early, you can be treated with antiviral medicine that is given by mouth (orally) or through an IV. This can help reduce how severe the illness is and how long it lasts. °Taking care of yourself at home can help relieve symptoms. Your health care provider may recommend: °Taking over-the-counter medicines. °Drinking plenty of fluids. °In many cases, the flu goes away on its own. If you have severe symptoms or complications, you may be treated in a hospital. °Follow these instructions at home: °Activity °Rest as needed and get plenty of sleep. °Stay home from work or school as told by your health care provider. Unless you are visiting your health care provider, avoid leaving home until your fever has been gone for 24 hours without taking medicine. °Eating and drinking °Take an oral rehydration solution (ORS). This is a drink that is sold at pharmacies and retail stores. °Drink enough fluid to keep your urine pale yellow. °Drink clear fluids in small amounts as you are able. Clear fluids include water, ice chips, fruit juice mixed with water, and low-calorie sports drinks. °Eat bland, easy-to-digest foods in small amounts as you are able. These foods include bananas, applesauce, rice, lean meats, toast, and crackers. °Avoid drinking fluids that contain a lot of sugar or caffeine, such as energy drinks, regular sports drinks, and soda. °Avoid alcohol. °Avoid spicy or fatty foods. °General instructions °  °Take over-the-counter and prescription medicines only as told by your health care provider. °Use a cool mist humidifier to add humidity to the air in your home. This can make it easier to breathe. °When using a cool mist humidifier,   clean it daily. Empty the water and replace it with clean water. °Cover your mouth and nose when you cough or sneeze. °Wash your hands with soap and water often and for at least 20 seconds, especially after you cough or  sneeze. If soap and water are not available, use alcohol-based hand sanitizer. °Keep all follow-up visits. This is important. °How is this prevented? ° °Get an annual flu shot. This is usually available in late summer, fall, or winter. Ask your health care provider when you should get your flu shot. °Avoid contact with people who are sick during cold and flu season. This is generally fall and winter. °Contact a health care provider if: °You develop new symptoms. °You have: °Chest pain. °Diarrhea. °A fever. °Your cough gets worse. °You produce more mucus. °You feel nauseous or you vomit. °Get help right away if you: °Develop shortness of breath or have difficulty breathing. °Have skin or nails that turn a bluish color. °Have severe pain or stiffness in your neck. °Develop a sudden headache or sudden pain in your face or ear. °Cannot eat or drink without vomiting. °These symptoms may represent a serious problem that is an emergency. Do not wait to see if the symptoms will go away. Get medical help right away. Call your local emergency services (911 in the [place] Do not drive yourself to the hospital. °Summary °Influenza, also called "the flu," is a viral infection that primarily affects your respiratory tract. °Symptoms of the flu usually begin suddenly and last 4-14 days. °Getting an annual flu shot is the best way to prevent getting the flu. °Stay home from work or school as told by your health care provider. Unless you are visiting your health care provider, avoid leaving home until your fever has been gone for 24 hours without taking medicine. °Keep all follow-up visits. This is important. °This information is not intended to replace advice given to you by your health care provider. Make sure you discuss any questions you have with your health care provider. °Document Revised: 10/14/2019 Document Reviewed: 10/14/2019 °Elsevier Patient Education © 2022 [employer] ° °

## 2020-12-26 NOTE — Assessment & Plan Note (Signed)
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 Adjuvant chemotherapy with dose dense Adriamycin and Cytoxan x 4 followed by Abraxane weekly 12 because of lymph node positive disease. Started 09/04/14 to 01/15/15 2. adjuvant radiation completed 03/20/15 3. Followed by antiestrogen therapy started 04/03/15 -------------------------------------------------------------------------------------------------------------------------- Anastrozole toxicities: Patient denies any hot flashes. Weight gain Arthralgias and myalgias  PALLAS Trial : Patient has been randomized to anastrozole alone. ABC clinical trial: The trial randomizes between aspirin versus placebo. She went on trial 07/26/2015 Currently on 100 mg of dosage.No adverse effects from participating in the trial.  Adverse effects: 1. Bruising/ Hematoma: Currently taking100 mg of aspirin/placebo:Mild 2. Osteoporosis: Fosamax weekly, DEXA scan 07/28/2019: T score -2.1 (improved from -2.5)  Cognitive dysfunction: OnAricept 10 mg daily, followed by PCP, doing much better  Weight gain:On Weight Watchers, now doing Keto diet, exercising, stable  Breast cancer surveillance: 1. Mammogram5/24/2022benign 2. Breast exam4/13/22: No palpable lumps or nodules of concern. 3.CT CAP 10/28/2017: No evidence of malignancy, radiation changes anterior left upper lobe  Bone density 07/26/2015: T score -1.9, osteopenia Bone density: 07/28/19 T score -2.1currently on Fosamax  Her daughter is going to early [employer] she is very proud  of her.  She is hoping to go into movies/TV production. No clinical evidence of breast cancer recurrence. Return to clinic in 6 months for follow-up

## 2021-01-10 LAB — NICOTINE/COTININE METABOLITES
Cotinine: <1 ng/mL
Nicotine: <1 ng/mL

## 2021-01-11 ENCOUNTER — Encounter: Payer: Self-pay | Admitting: *Deleted

## 2021-01-11 NOTE — Progress Notes (Signed)
Per pt request, RN successfully faxed recent lab work and signed physician statement to [facility] [phone]).

## 2021-01-21 ENCOUNTER — Encounter: Payer: Self-pay | Admitting: Internal Medicine

## 2021-02-22 ENCOUNTER — Ambulatory Visit (INDEPENDENT_AMBULATORY_CARE_PROVIDER_SITE_OTHER): Payer: PRIVATE HEALTH INSURANCE | Admitting: Family Medicine

## 2021-02-22 ENCOUNTER — Encounter: Payer: Self-pay | Admitting: Family Medicine

## 2021-02-22 VITALS — BP 142/87 | HR 65 | Temp 97.2°F | Ht 65.0 in | Wt 166.4 lb

## 2021-02-22 DIAGNOSIS — H8101 Meniere's disease, right ear: Secondary | ICD-10-CM

## 2021-02-22 DIAGNOSIS — Z1211 Encounter for screening for malignant neoplasm of colon: Secondary | ICD-10-CM

## 2021-02-22 DIAGNOSIS — M79604 Pain in right leg: Secondary | ICD-10-CM | POA: Diagnosis not present

## 2021-02-22 DIAGNOSIS — Z23 Encounter for immunization: Secondary | ICD-10-CM | POA: Diagnosis not present

## 2021-02-22 DIAGNOSIS — Z79899 Other long term (current) drug therapy: Secondary | ICD-10-CM

## 2021-02-22 MED ORDER — CLONAZEPAM 0.5 MG PO TABS: 0.5000 mg | ORAL_TABLET | Freq: Three times a day (TID) | ORAL | 5 refills | Status: DC | PRN

## 2021-02-22 NOTE — Progress Notes (Addendum)
Subjective: CC: Follow-up Mnire's disease PCP: [name], DO [name] is a 60 y.o. female presenting to clinic today for:  1.  Mnire's disease Patient thought that she was having a recent flareup of Mnire's but apparently it was because her new contacts had 2 lenses stuck together.  Her symptoms have since resolved.  She uses Klonopin as needed flareups.  Denies any excessive daytime sedation from the medication.  No visual or auditory hallucinations.  No memory issues.  2.  Thigh pain Patient reports right-sided leg pain that abruptly onset.  She notes that the pain was moderate and she subsequently took Tylenol in efforts to alleviate.  Things did seem to get slightly better and then she woke up in the melanite with similar.  She denies any appreciable swelling, erythema, warmth.  She admits to being very physically active with Christmas but no other unusual activities that would have caused.  Symptoms are totally absent currently.   ROS: Per HPI  Allergies  Allergen Reactions   Statins Other (See Comments)   Crestor [Rosuvastatin] Other (See Comments)    myalgias   Lipitor [Atorvastatin] Other (See Comments)    myalgias   Temazepam Other (See Comments)    Pt does not remember (Restoril)   Past Medical History:  Diagnosis Date   Abnormal Pap smear of cervix 2002   Dr. Eilleen Kempf    Allergy 2012   seasonal   Anxiety    Arthritis    Benign hemangioma    Dr,  Marybelle Killings    Breast cancer ([place]) 07/20/14   Left Breast   Cervical stenosis (uterine cervix) 2002   Depression    Dizziness 1991   secondary to Meniere's disease   Hyperlipidemia 2007   Insomnia 1997   Meniere's disease 1991   Nervousness(799.21) 1987    Current Outpatient Medications:    acetaminophen (TYLENOL) 325 MG tablet, Take 975 mg by mouth daily as needed for moderate pain. Usually takes twice daily in morning and bedtime and additionally during the day if needed for pain, Disp:  , Rfl:    alendronate (FOSAMAX) 70 MG tablet, TAKE 1 TABLET ONCE A WEEK. TAKE WITH A FULL GLASS OF WATER ON AN EMPTY STOMACH., Disp: 12 tablet, Rfl: 3   anastrozole (ARIMIDEX) 1 MG tablet, Take 1 tablet (1 mg total) by mouth daily., Disp: 90 tablet, Rfl: 3   Biotin 1000 MCG tablet, Take 1,000 mcg by mouth daily., Disp: , Rfl:    CALCIUM PO, Take 1 tablet by mouth daily., Disp: , Rfl:    clobetasol cream (TEMOVATE) 0.05 %, Apply topically 2 (two) times daily as needed., Disp: , Rfl:    clonazePAM (KLONOPIN) 0.5 MG tablet, Take 1 tablet (0.5 mg total) by mouth 3 (three) times daily as needed (put on file)., Disp: 90 tablet, Rfl: 5   donepezil (ARICEPT) 10 MG tablet, Take 1 tablet (10 mg total) by mouth at bedtime., Disp: 31 tablet, Rfl: 12   furosemide (LASIX) 20 MG tablet, TAKE 1 TABLET BY MOUTH EVERY DAY, Disp: 30 tablet, Rfl: 3   meclizine (ANTIVERT) 25 MG tablet, TAKE 1 TABLET (25 MG TOTAL) BY MOUTH 3 (THREE) TIMES DAILY AS NEEDED FOR DIZZINESS., Disp: 30 tablet, Rfl: 1   PARoxetine (PAXIL-CR) 25 MG 24 hr tablet, Take 1 tablet (25 mg total) by mouth daily., Disp: 30 tablet, Rfl: 11   potassium chloride (KLOR-CON) 10 MEQ tablet, TAKE 1 TABLET BY MOUTH EVERY DAY, Disp: 30 tablet, Rfl: 3  rosuvastatin (CRESTOR) 10 MG tablet, Take 1 tablet (10 mg total) by mouth daily., Disp: 30 tablet, Rfl: 12   triamcinolone cream (KENALOG) 0.1 %, Apply topically 2 (two) times daily., Disp: 60 g, Rfl: 0   Turmeric 500 MG TABS, Take 1 tablet by mouth daily., Disp: , Rfl:    Vitamin D, Ergocalciferol, (DRISDOL) 1.25 MG (50000 UNIT) CAPS capsule, TAKE 1 CAPSULE (50,000 UNITS TOTAL) BY MOUTH EVERY 7 (SEVEN) DAYS., Disp: 12 capsule, Rfl: 3 Social History   Socioeconomic History   Marital status: Married    Spouse name: Not on file   Number of children: Not on file   Years of education: Not on file   Highest education level: Not on file  Occupational History   Not on file  Tobacco Use   Smoking status: Never    Smokeless tobacco: Never  Vaping Use   Vaping Use: Never used  Substance and Sexual Activity   Alcohol use: Yes    Comment: very rare   Drug use: No   Sexual activity: Yes    Birth control/protection: Surgical    Comment: ablation  Other Topics Concern   Not on file  Social History Narrative   Not on file   Social Determinants of Health   Financial Resource Strain: Not on file  Food Insecurity: Not on file  Transportation Needs: Not on file  Physical Activity: Not on file  Stress: Not on file  Social Connections: Not on file  Intimate Partner Violence: Not on file   Family History  Problem Relation Age of Onset   Cirrhosis Father    Colon polyps Mother    Colon cancer Neg Hx    Esophageal cancer Neg Hx    Rectal cancer Neg Hx    Stomach cancer Neg Hx     Objective: Office vital signs reviewed. BP (!) 142/87    Pulse 65    Temp (!) 97.2 F (36.2 C)    Ht 5\' 5"  (1.651 m)    Wt 166 lb 6.4 oz (75.5 kg)    SpO2 97%    BMI 27.69 kg/m   Physical Examination:  General: Awake, alert, well nourished, No acute distress HEENT: Sclera white.  No nystagmus.  No carotid bruits Cardio: regular rate and rhythm, S1S2 heard, no murmurs appreciated Pulm: clear to auscultation bilaterally, no wheezes, rhonchi or rales; normal work of breathing on room air Extremities: warm, well perfused, No edema, cyanosis or clubbing; +2 pulses bilaterally MSK: Normal gait and station  Assessment/ Plan: 60 y.o. female   Meniere's disease of right ear - Plan: ToxASSURE Select 13 (MW), Urine, clonazePAM (KLONOPIN) 0.5 MG tablet  Controlled substance agreement signed - Plan: ToxASSURE Select 13 (MW), Urine  Need for pneumococcal vaccination - Plan: Pneumococcal conjugate vaccine 20-valent (Prevnar 20)  Screening for colon cancer - Plan: Cologuard  Right leg pain  Medications have been renewed.  She has had updated controlled substance contract and urine drug screen as per office policy today.   National narcotic database reviewed and there were no red flags.    Pneumococcal vaccination administered  Cologuard ordered.  Really does not want to do a colonoscopy and has been depending on FOBT's which would likely not catch an early colon cancer  Uncertain etiology of the right leg pain.  Question strain.  Nothing that sounds like DVT based on reports.  If persistent, low threshold to evaluate hip for possible radiation of pain.  May follow-up in 6 months for  annual physical exam with fasting labs, sooner if concerns arise  No orders of the defined types were placed in this encounter.  No orders of the defined types were placed in this encounter.    [name], DO [place] [phone]

## 2021-02-23 ENCOUNTER — Other Ambulatory Visit: Payer: Self-pay | Admitting: Hematology and Oncology

## 2021-02-23 DIAGNOSIS — Z170 Estrogen receptor positive status [ER+]: Secondary | ICD-10-CM

## 2021-02-23 DIAGNOSIS — R109 Unspecified abdominal pain: Secondary | ICD-10-CM

## 2021-02-23 DIAGNOSIS — C50412 Malignant neoplasm of upper-outer quadrant of left female breast: Secondary | ICD-10-CM

## 2021-02-23 DIAGNOSIS — M79651 Pain in right thigh: Secondary | ICD-10-CM

## 2021-02-23 NOTE — Progress Notes (Signed)
Patient called complaining of severe right thigh pain for 1 week. Its intense and radiates to her knee. Also vague abdominal discomfort. Given her H/O breast cancer, we would like to do full staging studies with CTCAP and Bone scan and Vascular Ultrasound of the right leg to rule out blood clots. We discussed gong to ER but she did not wan to do that.

## 2021-02-25 ENCOUNTER — Telehealth: Payer: Self-pay

## 2021-02-25 ENCOUNTER — Other Ambulatory Visit: Payer: Self-pay

## 2021-02-25 ENCOUNTER — Ambulatory Visit (HOSPITAL_COMMUNITY)
Admission: RE | Admit: 2021-02-25 | Discharge: 2021-02-25 | Disposition: A | Discharge status: Home / Self Care | Payer: 59 | Source: Ambulatory Visit | Attending: Hematology and Oncology | Admitting: Hematology and Oncology

## 2021-02-25 DIAGNOSIS — C50412 Malignant neoplasm of upper-outer quadrant of left female breast: Secondary | ICD-10-CM | POA: Diagnosis present

## 2021-02-25 DIAGNOSIS — Z170 Estrogen receptor positive status [ER+]: Secondary | ICD-10-CM

## 2021-02-25 DIAGNOSIS — M79651 Pain in right thigh: Secondary | ICD-10-CM

## 2021-02-25 DIAGNOSIS — R109 Unspecified abdominal pain: Secondary | ICD-10-CM | POA: Diagnosis not present

## 2021-02-25 NOTE — Telephone Encounter (Signed)
Called pt in regards to scheduling of several scans.  Pt instructed NPO 4 hrs prior to scan, pt instructed on contrast dye 2 hrs and 1 hrs prior to CT.  Pt verbalized understanding and thanks

## 2021-02-27 ENCOUNTER — Ambulatory Visit (HOSPITAL_COMMUNITY): Payer: 59

## 2021-02-27 ENCOUNTER — Encounter: Payer: Self-pay | Admitting: Internal Medicine

## 2021-02-27 ENCOUNTER — Encounter (HOSPITAL_COMMUNITY): Payer: Self-pay

## 2021-02-28 ENCOUNTER — Other Ambulatory Visit: Payer: Self-pay

## 2021-02-28 ENCOUNTER — Ambulatory Visit (HOSPITAL_COMMUNITY)
Admission: RE | Admit: 2021-02-28 | Discharge: 2021-02-28 | Disposition: A | Discharge status: Home / Self Care | Payer: 59 | Source: Ambulatory Visit | Attending: Hematology and Oncology | Admitting: Hematology and Oncology

## 2021-02-28 DIAGNOSIS — C50412 Malignant neoplasm of upper-outer quadrant of left female breast: Secondary | ICD-10-CM | POA: Insufficient documentation

## 2021-02-28 DIAGNOSIS — Z170 Estrogen receptor positive status [ER+]: Secondary | ICD-10-CM | POA: Diagnosis present

## 2021-02-28 DIAGNOSIS — M79651 Pain in right thigh: Secondary | ICD-10-CM | POA: Insufficient documentation

## 2021-02-28 DIAGNOSIS — R109 Unspecified abdominal pain: Secondary | ICD-10-CM | POA: Diagnosis present

## 2021-02-28 MED ORDER — IOHEXOL 350 MG/ML SOLN
80.0000 mL | Freq: Once | INTRAVENOUS | Status: AC | PRN
  Administered 2021-02-28: 17:00:00 80 mL via INTRAVENOUS

## 2021-03-01 ENCOUNTER — Other Ambulatory Visit: Payer: Self-pay | Admitting: Hematology and Oncology

## 2021-03-01 ENCOUNTER — Telehealth: Payer: Self-pay

## 2021-03-01 DIAGNOSIS — Z170 Estrogen receptor positive status [ER+]: Secondary | ICD-10-CM

## 2021-03-01 DIAGNOSIS — R911 Solitary pulmonary nodule: Secondary | ICD-10-CM

## 2021-03-01 NOTE — Telephone Encounter (Signed)
Called pt to inform her of PET scan scheduling, soonest available at [facility] location, pt is accepting of this.  Instructed pt on NPO 6 hours prior, pt verbalized understanding and thanks

## 2021-03-01 NOTE — Progress Notes (Signed)
I reviewed the results of the CT scans and recommended that we obtain a PET/CT and a pulmonary consult.

## 2021-03-03 LAB — TOXASSURE SELECT 13 (MW), URINE

## 2021-03-05 ENCOUNTER — Ambulatory Visit (HOSPITAL_COMMUNITY)
Admission: RE | Admit: 2021-03-05 | Discharge: 2021-03-05 | Disposition: A | Discharge status: Home / Self Care | Payer: 59 | Source: Ambulatory Visit | Attending: Hematology and Oncology | Admitting: Hematology and Oncology

## 2021-03-05 ENCOUNTER — Telehealth: Payer: Self-pay | Admitting: *Deleted

## 2021-03-05 ENCOUNTER — Other Ambulatory Visit: Payer: Self-pay

## 2021-03-05 DIAGNOSIS — R109 Unspecified abdominal pain: Secondary | ICD-10-CM | POA: Insufficient documentation

## 2021-03-05 DIAGNOSIS — M79651 Pain in right thigh: Secondary | ICD-10-CM | POA: Insufficient documentation

## 2021-03-05 DIAGNOSIS — Z170 Estrogen receptor positive status [ER+]: Secondary | ICD-10-CM | POA: Diagnosis present

## 2021-03-05 DIAGNOSIS — C50412 Malignant neoplasm of upper-outer quadrant of left female breast: Secondary | ICD-10-CM | POA: Diagnosis present

## 2021-03-05 MED ORDER — TECHNETIUM TC 99M MEDRONATE IV KIT
20.0000 | PACK | Freq: Once | INTRAVENOUS | Status: AC | PRN
  Administered 2021-03-05: 12:00:00 20 via INTRAVENOUS

## 2021-03-05 NOTE — Telephone Encounter (Signed)
Contacted by Sharyn Lull w/[facility] with results of today's scan: Normal bone scan.  No evidence of osseous metastatic disease. If any questions, can call [facility] @ [phone]

## 2021-03-07 ENCOUNTER — Ambulatory Visit (HOSPITAL_COMMUNITY)
Admission: RE | Admit: 2021-03-07 | Discharge: 2021-03-07 | Disposition: A | Discharge status: Home / Self Care | Payer: 59 | Source: Ambulatory Visit | Attending: Hematology and Oncology | Admitting: Hematology and Oncology

## 2021-03-07 ENCOUNTER — Other Ambulatory Visit: Payer: Self-pay

## 2021-03-07 DIAGNOSIS — R911 Solitary pulmonary nodule: Secondary | ICD-10-CM | POA: Insufficient documentation

## 2021-03-07 MED ORDER — FLUDEOXYGLUCOSE F - 18 (FDG) INJECTION
7.7160 | Freq: Once | INTRAVENOUS | Status: AC | PRN
  Administered 2021-03-07: 12:00:00 7.716 via INTRAVENOUS

## 2021-03-12 ENCOUNTER — Other Ambulatory Visit: Payer: Self-pay

## 2021-03-12 ENCOUNTER — Ambulatory Visit (INDEPENDENT_AMBULATORY_CARE_PROVIDER_SITE_OTHER): Payer: 59 | Admitting: Emergency Medicine

## 2021-03-12 ENCOUNTER — Encounter: Payer: Self-pay | Admitting: Emergency Medicine

## 2021-03-12 VITALS — BP 116/74 | HR 70 | Ht 65.0 in | Wt 168.0 lb

## 2021-03-12 DIAGNOSIS — C3492 Malignant neoplasm of unspecified part of left bronchus or lung: Secondary | ICD-10-CM | POA: Insufficient documentation

## 2021-03-12 DIAGNOSIS — R911 Solitary pulmonary nodule: Secondary | ICD-10-CM | POA: Diagnosis not present

## 2021-03-12 HISTORY — DX: Malignant neoplasm of unspecified part of left bronchus or lung: C34.92

## 2021-03-12 NOTE — H&P (View-Only) (Signed)
Subjective:    Patient ID: [name], female    DOB: 09-07-1960, 61 y.o.   MRN: [id number]  HPI 61 year old never smoker with a history of left breast cancer (lumpectomy, chemo, XRT, anastrozole) followed by Dr. Lindi Adie.  She is referred today for abnormal CT scan of the chest and PET scan.  She underwent standard surveillance CT chest abdomen pelvis 02/28/2021 as below. She has great functional capacity. No cough. No CP.   CT chest abdomen pelvis 02/28/2021 reviewed by me, shows scattered right axillary nodes without any concerning characteristics, benign 3 mm right middle lobe nodule, some left upper lobe reticular scar from radiation, 9 x 5 x 6 mm mixed density subpleural left lower lobe nodule, probably slightly larger over time compared with prior scans back to 2019.  PET scan 03/07/2021 reviewed by me, shows no significant uptake in the 9 mm subpleural nodule   Review of Systems As per HPI  Past Medical History:  Diagnosis Date   Abnormal Pap smear of cervix 2002   Dr. Eilleen Kempf    Allergy 2012   seasonal   Anxiety    Arthritis    Benign hemangioma    Dr,  Marybelle Killings    Breast cancer [facility]) 07/20/14   Left Breast   Cervical stenosis (uterine cervix) 2002   Depression    Dizziness 1991   secondary to Meniere's disease   Hyperlipidemia 2007   Insomnia 1997   Meniere's disease 1991   Nervousness(799.21) 1987     Family History  Problem Relation Age of Onset   Cirrhosis Father    Colon polyps Mother    Colon cancer Neg Hx    Esophageal cancer Neg Hx    Rectal cancer Neg Hx    Stomach cancer Neg Hx     She had an aunt, never smoker, who was diagnosed with primary lung cancer  Social History   Socioeconomic History   Marital status: Married    Spouse name: Not on file   Number of children: Not on file   Years of education: Not on file   Highest education level: Not on file  Occupational History   Not on file  Tobacco Use   Smoking status: Never    Smokeless tobacco: Never  Vaping Use   Vaping Use: Never used  Substance and Sexual Activity   Alcohol use: Yes    Comment: very rare   Drug use: No   Sexual activity: Yes    Birth control/protection: Surgical    Comment: ablation  Other Topics Concern   Not on file  Social History Narrative   Not on file   Social Determinants of Health   Financial Resource Strain: Not on file  Food Insecurity: Not on file  Transportation Needs: Not on file  Physical Activity: Not on file  Stress: Not on file  Social Connections: Not on file  Intimate Partner Violence: Not on file    No inhaled exposures  Allergies  Allergen Reactions   Statins Other (See Comments)   Crestor [Rosuvastatin] Other (See Comments)    myalgias   Lipitor [Atorvastatin] Other (See Comments)    myalgias   Temazepam Other (See Comments)    Pt does not remember (Restoril)     Outpatient Medications Prior to Visit  Medication Sig Dispense Refill   acetaminophen (TYLENOL) 325 MG tablet Take 975 mg by mouth daily as needed for moderate pain. Usually takes twice daily in morning and bedtime and additionally during the  day if needed for pain     alendronate (FOSAMAX) 70 MG tablet TAKE 1 TABLET ONCE A WEEK. TAKE WITH A FULL GLASS OF WATER ON AN EMPTY STOMACH. 12 tablet 3   anastrozole (ARIMIDEX) 1 MG tablet Take 1 tablet (1 mg total) by mouth daily. 90 tablet 3   Biotin 1000 MCG tablet Take 1,000 mcg by mouth daily.     CALCIUM PO Take 1 tablet by mouth daily.     clobetasol cream (TEMOVATE) 0.05 % Apply topically 2 (two) times daily as needed.     clonazePAM (KLONOPIN) 0.5 MG tablet Take 1 tablet (0.5 mg total) by mouth 3 (three) times daily as needed (put on file). 90 tablet 5   donepezil (ARICEPT) 10 MG tablet Take 1 tablet (10 mg total) by mouth at bedtime. 31 tablet 12   furosemide (LASIX) 20 MG tablet TAKE 1 TABLET BY MOUTH EVERY DAY 30 tablet 3   meclizine (ANTIVERT) 25 MG tablet TAKE 1 TABLET (25 MG TOTAL) BY  MOUTH 3 (THREE) TIMES DAILY AS NEEDED FOR DIZZINESS. 30 tablet 1   PARoxetine (PAXIL-CR) 25 MG 24 hr tablet Take 1 tablet (25 mg total) by mouth daily. 30 tablet 11   potassium chloride (KLOR-CON) 10 MEQ tablet TAKE 1 TABLET BY MOUTH EVERY DAY 30 tablet 3   rosuvastatin (CRESTOR) 10 MG tablet Take 1 tablet (10 mg total) by mouth daily. 30 tablet 12   triamcinolone cream (KENALOG) 0.1 % Apply topically 2 (two) times daily. 60 g 0   Turmeric 500 MG TABS Take 1 tablet by mouth daily.     Vitamin D, Ergocalciferol, (DRISDOL) 1.25 MG (50000 UNIT) CAPS capsule TAKE 1 CAPSULE (50,000 UNITS TOTAL) BY MOUTH EVERY 7 (SEVEN) DAYS. 12 capsule 3   No facility-administered medications prior to visit.         Objective:   Physical Exam  Vitals:   03/12/21 0937  BP: 116/74  Pulse: 70  SpO2: 98%  Weight: 168 lb (76.2 kg)  Height: 5\' 5"  (1.651 m)   Gen: Pleasant, well-nourished, in no distress,  normal affect  ENT: No lesions,  mouth clear,  oropharynx clear, no postnasal drip  Neck: No JVD, no stridor  Lungs: No use of accessory muscles, no crackles or wheezing on normal respiration, no wheeze on forced expiration  Cardiovascular: RRR, heart sounds normal, no murmur or gallops, no peripheral edema  Musculoskeletal: No deformities, no cyanosis or clubbing  Neuro: alert, awake, non focal  Skin: Warm, no lesions or rash     Assessment & Plan:   Pulmonary nodule Peripheral mixed density left lower lobe pulmonary nodule, 9 mm in largest dimension.  No hypermetabolism on PET scan.  Given slow change over time suspicious for an adenocarcinoma.  Discussed the options with her including watchful waiting, navigational bronchoscopy, primary resection.  She would like to speak with thoracic surgery to talk about resection.  I agree with this plan.  I will check PFT and refer her to cardiothoracic surgery.  If there is a role here for combined procedure, navigational bronchoscopy and dye marking then  I will be happy to assist with that.  We reviewed your CT scan of the chest, PET scan today. We will arrange for full pulmonary function testing We will refer you to see thoracic surgery to discuss possible resection of your small left lower lobe pulmonary nodule Follow with Dr Lamonte Sakai in 1 month   Baltazar Apo, MD, PhD 03/12/2021, 10:22 AM [facility] [phone] or if no answer before 7:00PM call [phone] For any issues after 7:00PM please call eLink [phone]

## 2021-03-12 NOTE — Progress Notes (Signed)
Subjective:    Patient ID: [name], female    DOB: Nov 16, 1960, 61 y.o.   MRN: [id number]  HPI 61 year old never smoker with a history of left breast cancer (lumpectomy, chemo, XRT, anastrozole) followed by Dr. Lindi Adie.  She is referred today for abnormal CT scan of the chest and PET scan.  She underwent standard surveillance CT chest abdomen pelvis 02/28/2021 as below. She has great functional capacity. No cough. No CP.   CT chest abdomen pelvis 02/28/2021 reviewed by me, shows scattered right axillary nodes without any concerning characteristics, benign 3 mm right middle lobe nodule, some left upper lobe reticular scar from radiation, 9 x 5 x 6 mm mixed density subpleural left lower lobe nodule, probably slightly larger over time compared with prior scans back to 2019.  PET scan 03/07/2021 reviewed by me, shows no significant uptake in the 9 mm subpleural nodule   Review of Systems As per HPI  Past Medical History:  Diagnosis Date   Abnormal Pap smear of cervix 2002   Dr. Eilleen Kempf    Allergy 2012   seasonal   Anxiety    Arthritis    Benign hemangioma    Dr,  Marybelle Killings    Breast cancer [facility]) 07/20/14   Left Breast   Cervical stenosis (uterine cervix) 2002   Depression    Dizziness 1991   secondary to Meniere's disease   Hyperlipidemia 2007   Insomnia 1997   Meniere's disease 1991   Nervousness(799.21) 1987     Family History  Problem Relation Age of Onset   Cirrhosis Father    Colon polyps Mother    Colon cancer Neg Hx    Esophageal cancer Neg Hx    Rectal cancer Neg Hx    Stomach cancer Neg Hx     She had an aunt, never smoker, who was diagnosed with primary lung cancer  Social History   Socioeconomic History   Marital status: Married    Spouse name: Not on file   Number of children: Not on file   Years of education: Not on file   Highest education level: Not on file  Occupational History   Not on file  Tobacco Use   Smoking status: Never    Smokeless tobacco: Never  Vaping Use   Vaping Use: Never used  Substance and Sexual Activity   Alcohol use: Yes    Comment: very rare   Drug use: No   Sexual activity: Yes    Birth control/protection: Surgical    Comment: ablation  Other Topics Concern   Not on file  Social History Narrative   Not on file   Social Determinants of Health   Financial Resource Strain: Not on file  Food Insecurity: Not on file  Transportation Needs: Not on file  Physical Activity: Not on file  Stress: Not on file  Social Connections: Not on file  Intimate Partner Violence: Not on file    No inhaled exposures  Allergies  Allergen Reactions   Statins Other (See Comments)   Crestor [Rosuvastatin] Other (See Comments)    myalgias   Lipitor [Atorvastatin] Other (See Comments)    myalgias   Temazepam Other (See Comments)    Pt does not remember (Restoril)     Outpatient Medications Prior to Visit  Medication Sig Dispense Refill   acetaminophen (TYLENOL) 325 MG tablet Take 975 mg by mouth daily as needed for moderate pain. Usually takes twice daily in morning and bedtime and additionally during the  day if needed for pain     alendronate (FOSAMAX) 70 MG tablet TAKE 1 TABLET ONCE A WEEK. TAKE WITH A FULL GLASS OF WATER ON AN EMPTY STOMACH. 12 tablet 3   anastrozole (ARIMIDEX) 1 MG tablet Take 1 tablet (1 mg total) by mouth daily. 90 tablet 3   Biotin 1000 MCG tablet Take 1,000 mcg by mouth daily.     CALCIUM PO Take 1 tablet by mouth daily.     clobetasol cream (TEMOVATE) 0.05 % Apply topically 2 (two) times daily as needed.     clonazePAM (KLONOPIN) 0.5 MG tablet Take 1 tablet (0.5 mg total) by mouth 3 (three) times daily as needed (put on file). 90 tablet 5   donepezil (ARICEPT) 10 MG tablet Take 1 tablet (10 mg total) by mouth at bedtime. 31 tablet 12   furosemide (LASIX) 20 MG tablet TAKE 1 TABLET BY MOUTH EVERY DAY 30 tablet 3   meclizine (ANTIVERT) 25 MG tablet TAKE 1 TABLET (25 MG TOTAL) BY  MOUTH 3 (THREE) TIMES DAILY AS NEEDED FOR DIZZINESS. 30 tablet 1   PARoxetine (PAXIL-CR) 25 MG 24 hr tablet Take 1 tablet (25 mg total) by mouth daily. 30 tablet 11   potassium chloride (KLOR-CON) 10 MEQ tablet TAKE 1 TABLET BY MOUTH EVERY DAY 30 tablet 3   rosuvastatin (CRESTOR) 10 MG tablet Take 1 tablet (10 mg total) by mouth daily. 30 tablet 12   triamcinolone cream (KENALOG) 0.1 % Apply topically 2 (two) times daily. 60 g 0   Turmeric 500 MG TABS Take 1 tablet by mouth daily.     Vitamin D, Ergocalciferol, (DRISDOL) 1.25 MG (50000 UNIT) CAPS capsule TAKE 1 CAPSULE (50,000 UNITS TOTAL) BY MOUTH EVERY 7 (SEVEN) DAYS. 12 capsule 3   No facility-administered medications prior to visit.         Objective:   Physical Exam  Vitals:   03/12/21 0937  BP: 116/74  Pulse: 70  SpO2: 98%  Weight: 168 lb (76.2 kg)  Height: 5\' 5"  (1.651 m)   Gen: Pleasant, well-nourished, in no distress,  normal affect  ENT: No lesions,  mouth clear,  oropharynx clear, no postnasal drip  Neck: No JVD, no stridor  Lungs: No use of accessory muscles, no crackles or wheezing on normal respiration, no wheeze on forced expiration  Cardiovascular: RRR, heart sounds normal, no murmur or gallops, no peripheral edema  Musculoskeletal: No deformities, no cyanosis or clubbing  Neuro: alert, awake, non focal  Skin: Warm, no lesions or rash     Assessment & Plan:   Pulmonary nodule Peripheral mixed density left lower lobe pulmonary nodule, 9 mm in largest dimension.  No hypermetabolism on PET scan.  Given slow change over time suspicious for an adenocarcinoma.  Discussed the options with her including watchful waiting, navigational bronchoscopy, primary resection.  She would like to speak with thoracic surgery to talk about resection.  I agree with this plan.  I will check PFT and refer her to cardiothoracic surgery.  If there is a role here for combined procedure, navigational bronchoscopy and dye marking then  I will be happy to assist with that.  We reviewed your CT scan of the chest, PET scan today. We will arrange for full pulmonary function testing We will refer you to see thoracic surgery to discuss possible resection of your small left lower lobe pulmonary nodule Follow with Dr Lamonte Sakai in 1 month   Baltazar Apo, MD, PhD 03/12/2021, 10:22 AM [facility] [phone] or if no answer before 7:00PM call [phone] For any issues after 7:00PM please call eLink [phone]

## 2021-03-12 NOTE — Assessment & Plan Note (Signed)
Peripheral mixed density left lower lobe pulmonary nodule, 9 mm in largest dimension.  No hypermetabolism on PET scan.  Given slow change over time suspicious for an adenocarcinoma.  Discussed the options with her including watchful waiting, navigational bronchoscopy, primary resection.  She would like to speak with thoracic surgery to talk about resection.  I agree with this plan.  I will check PFT and refer her to cardiothoracic surgery.  If there is a role here for combined procedure, navigational bronchoscopy and dye marking then I will be happy to assist with that.  We reviewed your CT scan of the chest, PET scan today. We will arrange for full pulmonary function testing We will refer you to see thoracic surgery to discuss possible resection of your small left lower lobe pulmonary nodule Follow with Dr Lamonte Sakai in 1 month

## 2021-03-12 NOTE — Patient Instructions (Addendum)
We reviewed your CT scan of the chest, PET scan today. We will arrange for full pulmonary function testing We will refer you to see thoracic surgery to discuss possible resection of your small left lower lobe pulmonary nodule Follow with Dr Lamonte Sakai in 1 month

## 2021-03-13 ENCOUNTER — Encounter: Payer: Self-pay | Admitting: *Deleted

## 2021-03-13 ENCOUNTER — Other Ambulatory Visit: Payer: Self-pay | Admitting: *Deleted

## 2021-03-13 ENCOUNTER — Institutional Professional Consult (permissible substitution) (INDEPENDENT_AMBULATORY_CARE_PROVIDER_SITE_OTHER): Payer: 59 | Admitting: Thoracic Surgery (Cardiothoracic Vascular Surgery)

## 2021-03-13 VITALS — BP 157/90 | HR 72 | Resp 20 | Ht 65.0 in | Wt 168.0 lb

## 2021-03-13 DIAGNOSIS — R911 Solitary pulmonary nodule: Secondary | ICD-10-CM | POA: Diagnosis not present

## 2021-03-13 NOTE — H&P (View-Only) (Signed)
PCP is Nicholas Lose, MD Referring Provider is Collene Gobble, MD  Chief Complaint  Patient presents with   Lung Lesion    Surgical consult, Chest CT 02/28/21, PET Scan 03/07/21, PFT's 03/12/21    HPI: Mrs. Schaad is sent for consultation regarding a left lower lobe lung nodule.  Kathalene Sporer is a 61 year old non-smoker with a past history significant for breast cancer, hyperlipidemia, Mnire's, allergies, anxiety, and depression.  She had a stage IIb (T2, N1C) lobular carcinoma of the left breast in 2016.  She was treated with lumpectomy, radiation, and chemotherapy.  Just before Christmas she was having pain in her right groin radiating down her right leg.  A CT of the chest abdomen and pelvis was performed as well as a duplex to rule out DVT.  The duplex was negative for DVT.  CT showed a slight enlargement of a subpleural subsolid nodule in the left lower lobe.  This was in comparison to a scan from 2019.  A PET/CT showed no significant metabolic activity in the nodule.  She saw Dr. Lamonte Sakai.  After talking with him she wanted to pursue surgical resection.  She denies any chest pain, pressure, tightness, shortness of breath, cough, wheezing, change in appetite, weight loss, headaches, or visual changes.  Zubrod Score: At the time of surgery this patients most appropriate activity status/level should be described as: [x]     0    Normal activity, no symptoms []     1    Restricted in physical strenuous activity but ambulatory, able to do out light work []     2    Ambulatory and capable of self care, unable to do work activities, up and about >50 % of waking hours                              []     3    Only limited self care, in bed greater than 50% of waking hours []     4    Completely disabled, no self care, confined to bed or chair []     5    Moribund  Past Medical History:  Diagnosis Date   Abnormal Pap smear of cervix 2002   Dr. Eilleen Kempf    Allergy 2012   seasonal   Anxiety     Arthritis    Benign hemangioma    Dr,  Marybelle Killings    Breast cancer ([place]) 07/20/14   Left Breast   Cervical stenosis (uterine cervix) 2002   Depression    Dizziness 1991   secondary to Meniere's disease   Hyperlipidemia 2007   Insomnia 1997   Meniere's disease 1991   Nervousness(799.21) 1987    Past Surgical History:  Procedure Laterality Date   AXILLARY LYMPH NODE DISSECTION Left 08/16/2014   Procedure: AXILLARY LYMPH NODE DISSECTION;  Surgeon: Coralie Keens, MD;  Location: [place];  Service: General;  Laterality: Left;   bilateral  tubal ligation  05/08/2004   BREAST LUMPECTOMY WITH RADIOACTIVE SEED AND SENTINEL LYMPH NODE BIOPSY Left 08/08/2014   Procedure: BREAST LUMPECTOMY WITH RADIOACTIVE SEED AND SENTINEL LYMPH NODE BIOPSY;  Surgeon: Coralie Keens, MD;  Location: [place];  Service: General;  Laterality: Left;   CHOLECYSTECTOMY  12/30/2016   COLONOSCOPY     MASS EXCISION Left 01/08/2018   Procedure: EXCISION MUCOID CYST, DEBRIDEMENT DISTAL INTERPHALANGEAL LEFT MIDDLE FINGER;  Surgeon: Daryll Brod, MD;  Location: [place];  Service: Orthopedics;  Laterality: Left;   MASTOPEXY Right 12/14/2015   Procedure: RIGHT BREAST MASTOPEXY;  Surgeon: Irene Limbo, MD;  Location: [place];  Service: Plastics;  Laterality: Right;   PORT-A-CATH REMOVAL N/A 03/08/2015   Procedure: REMOVAL PORT-A-CATH;  Surgeon: Coralie Keens, MD;  Location: [facility];  Service: General;  Laterality: N/A;   PORTACATH PLACEMENT Right 08/16/2014   Procedure: INSERTION PORT-A-CATH;  Surgeon: Coralie Keens, MD;  Location: [place];  Service: General;  Laterality: Right;   RE-EXCISION OF BREAST LUMPECTOMY Left 08/16/2014   Procedure: RE-EXCISION OF LEFT BREAST CANCER AND PORT PLACEMENT/POSSIBLE AXILLARY DISECTION;  Surgeon: Coralie Keens, MD;  Location: [place];  Service: General;  Laterality: Left;   right  breast surg     [facility] Right 07/28/2012   Procedure: RIGHT SHOULDER DECOMPRESSION AND EXCISION OF CALCIFIC DEPOSIT;  Surgeon: Tobi Bastos, MD;  Location: [facility];  Service: Orthopedics;  Laterality: Right;   TUBAL LIGATION     uterine ablation  02/2006    Due to heavy periods Dr. Jon Billings    wisdom teeth extractions  yrs ago    Family History  Problem Relation Age of Onset   Cirrhosis Father    Colon polyps Mother    Colon cancer Neg Hx    Esophageal cancer Neg Hx    Rectal cancer Neg Hx    Stomach cancer Neg Hx     Social History Social History   Tobacco Use   Smoking status: Never   Smokeless tobacco: Never  Vaping Use   Vaping Use: Never used  Substance Use Topics   Alcohol use: Yes    Comment: very rare   Drug use: No    Current Outpatient Medications  Medication Sig Dispense Refill   acetaminophen (TYLENOL) 325 MG tablet Take 975 mg by mouth daily as needed for moderate pain. Usually takes twice daily in morning and bedtime and additionally during the day if needed for pain     alendronate (FOSAMAX) 70 MG tablet TAKE 1 TABLET ONCE A WEEK. TAKE WITH A FULL GLASS OF WATER ON AN EMPTY STOMACH. 12 tablet 3   anastrozole (ARIMIDEX) 1 MG tablet Take 1 tablet (1 mg total) by mouth daily. 90 tablet 3   Biotin 1000 MCG tablet Take 1,000 mcg by mouth daily.     CALCIUM PO Take 1 tablet by mouth daily.     clobetasol cream (TEMOVATE) 0.05 % Apply topically 2 (two) times daily as needed.     clonazePAM (KLONOPIN) 0.5 MG tablet Take 1 tablet (0.5 mg total) by mouth 3 (three) times daily as needed (put on file). 90 tablet 5   donepezil (ARICEPT) 10 MG tablet Take 1 tablet (10 mg total) by mouth at bedtime. 31 tablet 12   furosemide (LASIX) 20 MG tablet TAKE 1 TABLET BY MOUTH EVERY DAY 30 tablet 3   meclizine (ANTIVERT) 25 MG tablet TAKE 1 TABLET (25 MG TOTAL) BY MOUTH 3 (THREE) TIMES DAILY AS NEEDED FOR DIZZINESS. 30 tablet 1    PARoxetine (PAXIL-CR) 25 MG 24 hr tablet Take 1 tablet (25 mg total) by mouth daily. 30 tablet 11   potassium chloride (KLOR-CON) 10 MEQ tablet TAKE 1 TABLET BY MOUTH EVERY DAY 30 tablet 3   rosuvastatin (CRESTOR) 10 MG tablet Take 1 tablet (10 mg total) by mouth daily. 30 tablet 12   triamcinolone cream (KENALOG) 0.1 % Apply topically 2 (two)  times daily. 60 g 0   Turmeric 500 MG TABS Take 1 tablet by mouth daily.     Vitamin D, Ergocalciferol, (DRISDOL) 1.25 MG (50000 UNIT) CAPS capsule TAKE 1 CAPSULE (50,000 UNITS TOTAL) BY MOUTH EVERY 7 (SEVEN) DAYS. 12 capsule 3   No current facility-administered medications for this visit.    Allergies  Allergen Reactions   Statins Other (See Comments)   Crestor [Rosuvastatin] Other (See Comments)    myalgias   Lipitor [Atorvastatin] Other (See Comments)    myalgias   Temazepam Other (See Comments)    Pt does not remember (Restoril)    Review of Systems  Constitutional:  Negative for activity change, appetite change and unexpected weight change.  HENT:  Positive for hearing loss. Negative for trouble swallowing and voice change.   Eyes:  Negative for visual disturbance.  Respiratory:  Negative for cough, shortness of breath and wheezing.   Cardiovascular:  Negative for chest pain and leg swelling.  Gastrointestinal:  Negative for abdominal distention and abdominal pain.  Genitourinary:  Negative for difficulty urinating and dysuria.  Neurological:  Positive for dizziness. Negative for syncope and weakness.  Hematological:  Bruises/bleeds easily.  All other systems reviewed and are negative.  BP (!) 157/90    Pulse 72    Resp 20    Ht 5\' 5"  (1.651 m)    Wt 168 lb (76.2 kg)    SpO2 96% Comment: RA   BMI 27.96 kg/m  Physical Exam Vitals reviewed.  Constitutional:      General: She is not in acute distress.    Appearance: Normal appearance.  HENT:     Head: Normocephalic and atraumatic.  Eyes:     General: No scleral icterus.     Extraocular Movements: Extraocular movements intact.  Cardiovascular:     Rate and Rhythm: Normal rate and regular rhythm.     Heart sounds: Normal heart sounds. No murmur heard. Pulmonary:     Effort: Pulmonary effort is normal. No respiratory distress.     Breath sounds: Normal breath sounds. No wheezing.  Abdominal:     General: There is no distension.     Palpations: Abdomen is soft.  Skin:    General: Skin is warm and dry.  Neurological:     General: No focal deficit present.     Mental Status: She is alert and oriented to person, place, and time.     Cranial Nerves: No cranial nerve deficit.     Motor: No weakness.     Diagnostic Tests: CT CHEST FINDINGS   Cardiovascular: Mild atherosclerotic calcification of the aortic arch and of the right brachiocephalic artery.   Mediastinum/Nodes: Prior left lateral lumpectomy. Stable local scarring along the associated staples without substantial morphologic change or masslike appearance. Stable findings of prior left axillary dissection without pathologic adenopathy. Scattered right axillary lymph nodes have fatty hila and are most likely benign.   Lungs/Pleura: Biapical pleuroparenchymal scarring. Stable 3 mm right middle lobe pulmonary nodule on image 85 series 6, considered benign given the stability from 2019.   Subpleural reticulation in density anteriorly in the left upper lobe compatible with prior radiation port, not substantially changed.   Sub solid nodule in the subpleural left lower lobe measuring 9 by 5 by 6 mm on image 101 series 6. My sense is that this has very slowly progressively enlarged since 2016. In 2016 and was a only a faint linear density, becoming slightly more ground-glass in density on 05/16/2016. This measures  about 8 by 4 by 4 mm on 10/28/2017. No substantial hypermetabolic activity in this vicinity on the prior PET-CT from 11/26/2016.   Musculoskeletal: Unremarkable   I personally reviewed  the CT images.  There is a 9 x 5 x 6 mm subsolid nodule in the posterior left lower lobe.  Slight increase in size in comparison to the film from 2019.  Definite increase in comparison to 2016.  No significant metabolic activity on PET/CT.  No evidence of regional or distant metastatic disease.  Impression: [name] is a 61 year old non-smoker with a history of breast cancer, hyperlipidemia, Mnire's, allergies, anxiety and depression.  She recently had a CT of the chest which showed a groundglass opacity in the posterior aspect of the left lower lobe.  That nodule had slightly increased in size in comparison to 2019 and significantly increased in comparison to 2016.  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  Infectious and inflammatory nodules are also in the differential.  This would be extraordinarily unusual appearance and behavior for metastatic disease.  We discussed 3 potential options.  1 radiographic follow-up.  2 bronchoscopic or CT-guided biopsy.  3 surgical resection.  I do not think bronchoscopic or CT-guided biopsy will be useful.  If it is positive she will need resection and if negative you would be able to trust that due to the small size of the nodule.  That leaves [place] with either radiographic follow-up or resection for definitive diagnosis and treatment.  We discussed the advantages and disadvantages of each.  I do not think it would be at all unreasonable to continue to follow this radiographically.  However, she is dead set on having it removed.  I discussed the proposed procedure with her and her husband.  This would require navigational bronchoscopy for marking the nodule prior to wedge resection.  We will need to coordinate that with Dr. Lamonte Sakai.  We would then plan to do a robotic assisted left VATS and wedge resection and node sampling.  Given the small size of the nodule, its peripheral location, and its relatively benign appearance with minimal  activity on PET, I do not think a lobectomy would be warranted unless there was a positive node.  I informed her of the general nature of the procedure including the need for general anesthesia, the incisions to be used, the use of drains to postoperatively, the expected hospital stay, and the overall recovery.  I informed her of the indications, risk, benefits, and alternatives.  She understands the risks include, but not limited to death, MI, DVT, PE, bleeding, possible need for transfusion, infection, prolonged air leak, cardiac arrhythmias, as well as possibility of other unforeseeable complications.  She accepts the risk and agrees to proceed.  Plan: We will plan for navigational bronchoscopy for marking followed by robotic assisted VATS for wedge resection and node sampling. Will coordinate with Dr. Agustina Caroli office  Melrose Nakayama, MD [facility] [phone]

## 2021-03-13 NOTE — Progress Notes (Signed)
PCP is Nicholas Lose, MD Referring Provider is Collene Gobble, MD  Chief Complaint  Patient presents with   Lung Lesion    Surgical consult, Chest CT 02/28/21, PET Scan 03/07/21, PFT's 03/12/21    HPI: [name] is sent for consultation regarding a left lower lobe lung nodule.  [name] is a 61 year old non-smoker with a past history significant for breast cancer, hyperlipidemia, Mnire's, allergies, anxiety, and depression.  She had a stage IIb (T2, N1C) lobular carcinoma of the left breast in 2016.  She was treated with lumpectomy, radiation, and chemotherapy.  Just before Christmas she was having pain in her right groin radiating down her right leg.  A CT of the chest abdomen and pelvis was performed as well as a duplex to rule out DVT.  The duplex was negative for DVT.  CT showed a slight enlargement of a subpleural subsolid nodule in the left lower lobe.  This was in comparison to a scan from 2019.  A PET/CT showed no significant metabolic activity in the nodule.  She saw Dr. Lamonte Sakai.  After talking with him she wanted to pursue surgical resection.  She denies any chest pain, pressure, tightness, shortness of breath, cough, wheezing, change in appetite, weight loss, headaches, or visual changes.  Zubrod Score: At the time of surgery this patients most appropriate activity status/level should be described as: [x]     0    Normal activity, no symptoms []     1    Restricted in physical strenuous activity but ambulatory, able to do out light work []     2    Ambulatory and capable of self care, unable to do work activities, up and about >50 % of waking hours                              []     3    Only limited self care, in bed greater than 50% of waking hours []     4    Completely disabled, no self care, confined to bed or chair []     5    Moribund  Past Medical History:  Diagnosis Date   Abnormal Pap smear of cervix 2002   Dr. Eilleen Kempf    Allergy 2012   seasonal   Anxiety     Arthritis    Benign hemangioma    Dr,  Marybelle Killings    Breast cancer ([place]) 07/20/14   Left Breast   Cervical stenosis (uterine cervix) 2002   Depression    Dizziness 1991   secondary to Meniere's disease   Hyperlipidemia 2007   Insomnia 1997   Meniere's disease 1991   Nervousness(799.21) 1987    Past Surgical History:  Procedure Laterality Date   AXILLARY LYMPH NODE DISSECTION Left 08/16/2014   Procedure: AXILLARY LYMPH NODE DISSECTION;  Surgeon: Coralie Keens, MD;  Location: [place];  Service: General;  Laterality: Left;   bilateral  tubal ligation  05/08/2004   BREAST LUMPECTOMY WITH RADIOACTIVE SEED AND SENTINEL LYMPH NODE BIOPSY Left 08/08/2014   Procedure: BREAST LUMPECTOMY WITH RADIOACTIVE SEED AND SENTINEL LYMPH NODE BIOPSY;  Surgeon: Coralie Keens, MD;  Location: [place];  Service: General;  Laterality: Left;   CHOLECYSTECTOMY  12/30/2016   COLONOSCOPY     MASS EXCISION Left 01/08/2018   Procedure: EXCISION MUCOID CYST, DEBRIDEMENT DISTAL INTERPHALANGEAL LEFT MIDDLE FINGER;  Surgeon: Daryll Brod, MD;  Location: [place];  Service: Orthopedics;  Laterality: Left;   MASTOPEXY Right 12/14/2015   Procedure: RIGHT BREAST MASTOPEXY;  Surgeon: Irene Limbo, MD;  Location: [place];  Service: Plastics;  Laterality: Right;   PORT-A-CATH REMOVAL N/A 03/08/2015   Procedure: REMOVAL PORT-A-CATH;  Surgeon: Coralie Keens, MD;  Location: [facility];  Service: General;  Laterality: N/A;   PORTACATH PLACEMENT Right 08/16/2014   Procedure: INSERTION PORT-A-CATH;  Surgeon: Coralie Keens, MD;  Location: [place];  Service: General;  Laterality: Right;   RE-EXCISION OF BREAST LUMPECTOMY Left 08/16/2014   Procedure: RE-EXCISION OF LEFT BREAST CANCER AND PORT PLACEMENT/POSSIBLE AXILLARY DISECTION;  Surgeon: Coralie Keens, MD;  Location: [place];  Service: General;  Laterality: Left;   right  breast surg     [facility] Right 07/28/2012   Procedure: RIGHT SHOULDER DECOMPRESSION AND EXCISION OF CALCIFIC DEPOSIT;  Surgeon: Tobi Bastos, MD;  Location: [facility];  Service: Orthopedics;  Laterality: Right;   TUBAL LIGATION     uterine ablation  02/2006    Due to heavy periods Dr. Jon Billings    wisdom teeth extractions  yrs ago    Family History  Problem Relation Age of Onset   Cirrhosis Father    Colon polyps Mother    Colon cancer Neg Hx    Esophageal cancer Neg Hx    Rectal cancer Neg Hx    Stomach cancer Neg Hx     Social History Social History   Tobacco Use   Smoking status: Never   Smokeless tobacco: Never  Vaping Use   Vaping Use: Never used  Substance Use Topics   Alcohol use: Yes    Comment: very rare   Drug use: No    Current Outpatient Medications  Medication Sig Dispense Refill   acetaminophen (TYLENOL) 325 MG tablet Take 975 mg by mouth daily as needed for moderate pain. Usually takes twice daily in morning and bedtime and additionally during the day if needed for pain     alendronate (FOSAMAX) 70 MG tablet TAKE 1 TABLET ONCE A WEEK. TAKE WITH A FULL GLASS OF WATER ON AN EMPTY STOMACH. 12 tablet 3   anastrozole (ARIMIDEX) 1 MG tablet Take 1 tablet (1 mg total) by mouth daily. 90 tablet 3   Biotin 1000 MCG tablet Take 1,000 mcg by mouth daily.     CALCIUM PO Take 1 tablet by mouth daily.     clobetasol cream (TEMOVATE) 0.05 % Apply topically 2 (two) times daily as needed.     clonazePAM (KLONOPIN) 0.5 MG tablet Take 1 tablet (0.5 mg total) by mouth 3 (three) times daily as needed (put on file). 90 tablet 5   donepezil (ARICEPT) 10 MG tablet Take 1 tablet (10 mg total) by mouth at bedtime. 31 tablet 12   furosemide (LASIX) 20 MG tablet TAKE 1 TABLET BY MOUTH EVERY DAY 30 tablet 3   meclizine (ANTIVERT) 25 MG tablet TAKE 1 TABLET (25 MG TOTAL) BY MOUTH 3 (THREE) TIMES DAILY AS NEEDED FOR DIZZINESS. 30 tablet 1    PARoxetine (PAXIL-CR) 25 MG 24 hr tablet Take 1 tablet (25 mg total) by mouth daily. 30 tablet 11   potassium chloride (KLOR-CON) 10 MEQ tablet TAKE 1 TABLET BY MOUTH EVERY DAY 30 tablet 3   rosuvastatin (CRESTOR) 10 MG tablet Take 1 tablet (10 mg total) by mouth daily. 30 tablet 12   triamcinolone cream (KENALOG) 0.1 % Apply topically 2 (two)  times daily. 60 g 0   Turmeric 500 MG TABS Take 1 tablet by mouth daily.     Vitamin D, Ergocalciferol, (DRISDOL) 1.25 MG (50000 UNIT) CAPS capsule TAKE 1 CAPSULE (50,000 UNITS TOTAL) BY MOUTH EVERY 7 (SEVEN) DAYS. 12 capsule 3   No current facility-administered medications for this visit.    Allergies  Allergen Reactions   Statins Other (See Comments)   Crestor [Rosuvastatin] Other (See Comments)    myalgias   Lipitor [Atorvastatin] Other (See Comments)    myalgias   Temazepam Other (See Comments)    Pt does not remember (Restoril)    Review of Systems  Constitutional:  Negative for activity change, appetite change and unexpected weight change.  HENT:  Positive for hearing loss. Negative for trouble swallowing and voice change.   Eyes:  Negative for visual disturbance.  Respiratory:  Negative for cough, shortness of breath and wheezing.   Cardiovascular:  Negative for chest pain and leg swelling.  Gastrointestinal:  Negative for abdominal distention and abdominal pain.  Genitourinary:  Negative for difficulty urinating and dysuria.  Neurological:  Positive for dizziness. Negative for syncope and weakness.  Hematological:  Bruises/bleeds easily.  All other systems reviewed and are negative.  BP (!) 157/90    Pulse 72    Resp 20    Ht 5\' 5"  (1.651 m)    Wt 168 lb (76.2 kg)    SpO2 96% Comment: RA   BMI 27.96 kg/m  Physical Exam Vitals reviewed.  Constitutional:      General: She is not in acute distress.    Appearance: Normal appearance.  HENT:     Head: Normocephalic and atraumatic.  Eyes:     General: No scleral icterus.     Extraocular Movements: Extraocular movements intact.  Cardiovascular:     Rate and Rhythm: Normal rate and regular rhythm.     Heart sounds: Normal heart sounds. No murmur heard. Pulmonary:     Effort: Pulmonary effort is normal. No respiratory distress.     Breath sounds: Normal breath sounds. No wheezing.  Abdominal:     General: There is no distension.     Palpations: Abdomen is soft.  Skin:    General: Skin is warm and dry.  Neurological:     General: No focal deficit present.     Mental Status: She is alert and oriented to person, place, and time.     Cranial Nerves: No cranial nerve deficit.     Motor: No weakness.     Diagnostic Tests: CT CHEST FINDINGS   Cardiovascular: Mild atherosclerotic calcification of the aortic arch and of the right brachiocephalic artery.   Mediastinum/Nodes: Prior left lateral lumpectomy. Stable local scarring along the associated staples without substantial morphologic change or masslike appearance. Stable findings of prior left axillary dissection without pathologic adenopathy. Scattered right axillary lymph nodes have fatty hila and are most likely benign.   Lungs/Pleura: Biapical pleuroparenchymal scarring. Stable 3 mm right middle lobe pulmonary nodule on image 85 series 6, considered benign given the stability from 2019.   Subpleural reticulation in density anteriorly in the left upper lobe compatible with prior radiation port, not substantially changed.   Sub solid nodule in the subpleural left lower lobe measuring 9 by 5 by 6 mm on image 101 series 6. My sense is that this has very slowly progressively enlarged since 2016. In 2016 and was a only a faint linear density, becoming slightly more ground-glass in density on 05/16/2016. This measures  about 8 by 4 by 4 mm on 10/28/2017. No substantial hypermetabolic activity in this vicinity on the prior PET-CT from 11/26/2016.   Musculoskeletal: Unremarkable   I personally reviewed  the CT images.  There is a 9 x 5 x 6 mm subsolid nodule in the posterior left lower lobe.  Slight increase in size in comparison to the film from 2019.  Definite increase in comparison to 2016.  No significant metabolic activity on PET/CT.  No evidence of regional or distant metastatic disease.  Impression: [name] is a 61 year old non-smoker with a history of breast cancer, hyperlipidemia, Mnire's, allergies, anxiety and depression.  She recently had a CT of the chest which showed a groundglass opacity in the posterior aspect of the left lower lobe.  That nodule had slightly increased in size in comparison to 2019 and significantly increased in comparison to 2016.  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  Infectious and inflammatory nodules are also in the differential.  This would be extraordinarily unusual appearance and behavior for metastatic disease.  We discussed 3 potential options.  1 radiographic follow-up.  2 bronchoscopic or CT-guided biopsy.  3 surgical resection.  I do not think bronchoscopic or CT-guided biopsy will be useful.  If it is positive she will need resection and if negative you would be able to trust that due to the small size of the nodule.  That leaves [place] with either radiographic follow-up or resection for definitive diagnosis and treatment.  We discussed the advantages and disadvantages of each.  I do not think it would be at all unreasonable to continue to follow this radiographically.  However, she is dead set on having it removed.  I discussed the proposed procedure with her and her husband.  This would require navigational bronchoscopy for marking the nodule prior to wedge resection.  We will need to coordinate that with Dr. Lamonte Sakai.  We would then plan to do a robotic assisted left VATS and wedge resection and node sampling.  Given the small size of the nodule, its peripheral location, and its relatively benign appearance with minimal  activity on PET, I do not think a lobectomy would be warranted unless there was a positive node.  I informed her of the general nature of the procedure including the need for general anesthesia, the incisions to be used, the use of drains to postoperatively, the expected hospital stay, and the overall recovery.  I informed her of the indications, risk, benefits, and alternatives.  She understands the risks include, but not limited to death, MI, DVT, PE, bleeding, possible need for transfusion, infection, prolonged air leak, cardiac arrhythmias, as well as possibility of other unforeseeable complications.  She accepts the risk and agrees to proceed.  Plan: We will plan for navigational bronchoscopy for marking followed by robotic assisted VATS for wedge resection and node sampling. Will coordinate with Dr. Agustina Caroli office  Melrose Nakayama, MD [facility] [phone]

## 2021-03-14 ENCOUNTER — Telehealth: Payer: Self-pay | Admitting: Emergency Medicine

## 2021-03-14 DIAGNOSIS — R911 Solitary pulmonary nodule: Secondary | ICD-10-CM

## 2021-03-14 NOTE — Telephone Encounter (Signed)
[name] is scheduled for robotic nav / VATS on 04/01/21.   She needs a SuperD CT chest prior to the procedure.

## 2021-03-18 NOTE — Telephone Encounter (Signed)
Spoke with pt and notified her that Super D CT order was placed and that PCCs would be contacting her to set up appointment. Pt stated understanding. Nothing further needed at this time.

## 2021-03-27 NOTE — Progress Notes (Addendum)
Surgical Instructions    Your procedure is scheduled on Monday January 23rd.  Report to [facility] Main Entrance "A" at 8:45 A.M., then check in with the Admitting office.  Call this number if you have problems the morning of surgery:  [phone]   If you have any questions prior to your surgery date call [phone]: Open Monday-Friday 8am-4pm    Remember:  Do not eat after midnight the night before your surgery    Take these medicines the morning of surgery with A SIP OF WATER alendronate (FOSAMAX) 70 MG tablet PARoxetine (PAXIL-CR) 25 MG 24 hr tablet rosuvastatin (CRESTOR) 10 MG tablet  IF NEEDED  acetaminophen (TYLENOL) 500 MG tablet clonazePAM (KLONOPIN) 0.5 MG tablet meclizine (ANTIVERT) 25 MG tablet    As of today, STOP taking any Aspirin (unless otherwise instructed by your surgeon) Aleve, Naproxen, Ibuprofen, Motrin, Advil, Goody's, BC's, all herbal medications, fish oil, and all vitamins.  After your COVID test   You are not required to quarantine however you are required to wear a well-fitting mask when you are out and around people not in your household.  If your mask becomes wet or soiled, replace with a new one.  Wash your hands often with soap and water for 20 seconds or clean your hands with an alcohol-based hand sanitizer that contains at least 60% alcohol.  Do not share personal items.  Notify your provider: if you are in close contact with someone who has COVID  or if you develop a fever of 100.4 or greater, sneezing, cough, sore throat, shortness of breath or body aches.           Do not wear jewelry or makeup Do not wear lotions, powders, perfumes, or deodorant. Do not shave 48 hours prior to surgery.   Do not bring valuables to the hospital. DO Not wear nail polish, gel polish, artificial nails, or any other type of covering on natural nails (fingers and toes) If you have artificial nails or gel coating that need to be removed by a nail salon,  please have this removed prior to surgery. Artificial nails or gel coating may interfere with anesthesia's ability to adequately monitor your vital signs.             [facility] is not responsible for any belongings or valuables.  Do NOT Smoke (Tobacco/Vaping)  24 hours prior to your procedure  If you use a CPAP at night, you may bring your mask for your overnight stay.   Contacts, glasses, hearing aids, dentures or partials may not be worn into surgery, please bring cases for these belongings   For patients admitted to the hospital, discharge time will be determined by your treatment team.   Patients discharged the day of surgery will not be allowed to drive home, and someone needs to stay with them for 24 hours.  NO VISITORS WILL BE ALLOWED IN PRE-OP WHERE PATIENTS ARE PREPPED FOR SURGERY.  ONLY 1 SUPPORT PERSON MAY BE PRESENT IN THE WAITING ROOM WHILE YOU ARE IN SURGERY.  IF YOU ARE TO BE ADMITTED, ONCE YOU ARE IN YOUR ROOM YOU WILL BE ALLOWED TWO (2) VISITORS. 1 (ONE) VISITOR MAY STAY OVERNIGHT BUT MUST ARRIVE TO THE ROOM BY 8pm.  Minor children may have two parents present. Special consideration for safety and communication needs will be reviewed on a case by case basis.  Special instructions:    Oral Hygiene is also important to reduce your risk of infection.  Remember -  BRUSH YOUR TEETH THE MORNING OF SURGERY WITH YOUR REGULAR TOOTHPASTE   [place]- Preparing For Surgery  Before surgery, you can play an important role. Because skin is not sterile, your skin needs to be as free of germs as possible. You can reduce the number of germs on your skin by washing with CHG (chlorahexidine gluconate) Soap before surgery.  CHG is an antiseptic cleaner which kills germs and bonds with the skin to continue killing germs even after washing.     Please do not use if you have an allergy to CHG or antibacterial soaps. If your skin becomes reddened/irritated stop using the CHG.  Do not shave  (including legs and underarms) for at least 48 hours prior to first CHG shower. It is OK to shave your face.  Please follow these instructions carefully.     Shower the NIGHT BEFORE SURGERY and the MORNING OF SURGERY with CHG Soap.   If you chose to wash your hair, wash your hair first as usual with your normal shampoo. After you shampoo, rinse your hair and body thoroughly to remove the shampoo.  Then [employer] and genitals (private parts) with your normal soap and rinse thoroughly to remove soap.  After that Use CHG Soap as you would any other liquid soap. You can apply CHG directly to the skin and wash gently with a scrungie or a clean washcloth.   Apply the CHG Soap to your body ONLY FROM THE NECK DOWN.  Do not use on open wounds or open sores. Avoid contact with your eyes, ears, mouth and genitals (private parts). Wash Face and genitals (private parts)  with your normal soap.   Wash thoroughly, paying special attention to the area where your surgery will be performed.  Thoroughly rinse your body with warm water from the neck down.  DO NOT shower/wash with your normal soap after using and rinsing off the CHG Soap.  Pat yourself dry with a CLEAN TOWEL.  Wear CLEAN PAJAMAS to bed the night before surgery  Place CLEAN SHEETS on your bed the night before your surgery  DO NOT SLEEP WITH PETS.   Day of Surgery:  Take a shower with CHG soap. Wear Clean/Comfortable clothing the morning of surgery Do not apply any deodorants/lotions.   Remember to brush your teeth WITH YOUR REGULAR TOOTHPASTE.   Please read over the following fact sheets that you were given.

## 2021-03-28 ENCOUNTER — Encounter (HOSPITAL_COMMUNITY)
Admission: RE | Admit: 2021-03-28 | Discharge: 2021-03-28 | Disposition: A | Discharge status: Home / Self Care | Payer: 59 | Source: Ambulatory Visit | Attending: Emergency Medicine | Admitting: Emergency Medicine

## 2021-03-28 ENCOUNTER — Encounter (HOSPITAL_COMMUNITY): Payer: Self-pay

## 2021-03-28 ENCOUNTER — Other Ambulatory Visit: Payer: Self-pay

## 2021-03-28 VITALS — BP 130/80 | HR 71 | Temp 98.4°F | Resp 18 | Ht 65.0 in | Wt 167.9 lb

## 2021-03-28 DIAGNOSIS — Z01818 Encounter for other preprocedural examination: Secondary | ICD-10-CM | POA: Diagnosis not present

## 2021-03-28 DIAGNOSIS — R911 Solitary pulmonary nodule: Secondary | ICD-10-CM | POA: Diagnosis not present

## 2021-03-28 DIAGNOSIS — Z20822 Contact with and (suspected) exposure to covid-19: Secondary | ICD-10-CM | POA: Insufficient documentation

## 2021-03-28 LAB — URINALYSIS, ROUTINE W REFLEX MICROSCOPIC
Bilirubin Urine: NEGATIVE
Glucose, UA: 100 mg/dL — AB
Ketones, ur: NEGATIVE mg/dL
Nitrite: NEGATIVE
Protein, ur: NEGATIVE mg/dL
Specific Gravity, Urine: 1.025 (ref 1.005–1.030)
pH: 6 (ref 5.0–8.0)

## 2021-03-28 LAB — COMPREHENSIVE METABOLIC PANEL
ALT: 27 U/L (ref 0–44)
AST: 23 U/L (ref 15–41)
Albumin: 4.1 g/dL (ref 3.5–5.0)
Alkaline Phosphatase: 53 U/L (ref 38–126)
Anion gap: 11 (ref 5–15)
BUN: 10 mg/dL (ref 6–20)
CO2: 23 mmol/L (ref 22–32)
Calcium: 9.2 mg/dL (ref 8.9–10.3)
Chloride: 106 mmol/L (ref 98–111)
Creatinine, Ser: 0.58 mg/dL (ref 0.44–1.00)
GFR, Estimated: >60 mL/min (ref >60)
Glucose, Bld: 99 mg/dL (ref 70–99)
Potassium: 3.6 mmol/L (ref 3.5–5.1)
Sodium: 140 mmol/L (ref 135–145)
Total Bilirubin: 0.9 mg/dL (ref 0.3–1.2)
Total Protein: 6.4 g/dL — ABNORMAL LOW (ref 6.5–8.1)

## 2021-03-28 LAB — CBC
HCT: 41.5 % (ref 36.0–46.0)
Hemoglobin: 14.5 g/dL (ref 12.0–15.0)
MCH: 33 pg (ref 26.0–34.0)
MCHC: 34.9 g/dL (ref 30.0–36.0)
MCV: 94.3 fL (ref 80.0–100.0)
Platelets: 186 10*3/uL (ref 150–400)
RBC: 4.4 MIL/uL (ref 3.87–5.11)
RDW: 12.5 % (ref 11.5–15.5)
WBC: 6.4 10*3/uL (ref 4.0–10.5)
nRBC: 0 % (ref 0.0–0.2)

## 2021-03-28 LAB — URINALYSIS, MICROSCOPIC (REFLEX)

## 2021-03-28 LAB — SURGICAL PCR SCREEN
MRSA, PCR: NEGATIVE
Staphylococcus aureus: NEGATIVE

## 2021-03-28 LAB — BLOOD GAS, ARTERIAL
Acid-Base Excess: 1.8 mmol/L (ref 0.0–2.0)
Bicarbonate: 25.9 mmol/L (ref 20.0–28.0)
Drawn by: 58793
FIO2: 21
O2 Saturation: 97.2 %
Patient temperature: 37
pCO2 arterial: 40.8 mmHg (ref 32.0–48.0)
pH, Arterial: 7.419 (ref 7.350–7.450)
pO2, Arterial: 89.1 mmHg (ref 83.0–108.0)

## 2021-03-28 LAB — TYPE AND SCREEN
ABO/RH(D): O POS
Antibody Screen: NEGATIVE

## 2021-03-28 LAB — PROTIME-INR
INR: 0.9 (ref 0.8–1.2)
Prothrombin Time: 12.3 seconds (ref 11.4–15.2)

## 2021-03-28 LAB — APTT: aPTT: 26 seconds (ref 24–36)

## 2021-03-28 NOTE — Progress Notes (Addendum)
PCP - Dr. Nicholas Lose Cardiologist - Denies  Chest x-ray - 03/29/21 EKG - 03/28/21 Stress Test - Yes "long time ago" no f/u needed ECHO - 12/27/2014  Sleep Study - No  DM- Denies  COVID TEST- 03/28/21   Anesthesia review: Yes abnormal EKG  Patient denies shortness of breath, fever, cough and chest pain at PAT appointment   All instructions explained to the patient, with a verbal understanding of the material. Patient agrees to go over the instructions while at home for a better understanding. Patient also instructed to wear a mask while in public after being tested for COVID-19. The opportunity to ask questions was provided.   Patient request manual B/P to be taken. When automatic pressures are taken they tend to be 20 higher than the manual pressure.   [place] Left Dr. Leonarda Salon office, Levonne Spiller, message regarding UA results showing few bacteria and small leukocytes.

## 2021-03-29 ENCOUNTER — Ambulatory Visit (HOSPITAL_COMMUNITY)
Admission: RE | Admit: 2021-03-29 | Discharge: 2021-03-29 | Disposition: A | Discharge status: Home / Self Care | Payer: 59 | Source: Ambulatory Visit | Attending: Emergency Medicine | Admitting: Emergency Medicine

## 2021-03-29 DIAGNOSIS — R911 Solitary pulmonary nodule: Secondary | ICD-10-CM | POA: Insufficient documentation

## 2021-03-29 LAB — SARS CORONAVIRUS 2 (TAT 6-24 HRS): SARS Coronavirus 2: NEGATIVE

## 2021-03-30 NOTE — Progress Notes (Signed)
Patient returned call. Informed pt. After she has bronchoscopy she will stay in Endo until the operating room is ready for her. States she is concerned of being under anesthesia for a long period of time between cases. I offered her the option of speaking to an anesthesiologist  today but she declined, and  I informed her if she has questions of the procedure to contact her surgeons.

## 2021-04-01 ENCOUNTER — Ambulatory Visit (HOSPITAL_COMMUNITY): Payer: 59

## 2021-04-01 ENCOUNTER — Inpatient Hospital Stay (HOSPITAL_COMMUNITY)
Admission: RE | Admit: 2021-04-01 | Discharge: 2021-04-03 | DRG: 164 | Disposition: A | Discharge status: Home / Self Care | Payer: 59 | Attending: Thoracic Surgery (Cardiothoracic Vascular Surgery) | Admitting: Thoracic Surgery (Cardiothoracic Vascular Surgery)

## 2021-04-01 ENCOUNTER — Encounter (HOSPITAL_COMMUNITY): Payer: Self-pay | Admitting: Emergency Medicine

## 2021-04-01 ENCOUNTER — Encounter (HOSPITAL_COMMUNITY)
Admission: RE | Disposition: A | Discharge status: Home / Self Care | Payer: Self-pay | Source: Home / Self Care | Attending: Thoracic Surgery (Cardiothoracic Vascular Surgery)

## 2021-04-01 ENCOUNTER — Ambulatory Visit (HOSPITAL_COMMUNITY): Payer: 59 | Admitting: Certified Registered Nurse Anesthetist

## 2021-04-01 ENCOUNTER — Other Ambulatory Visit: Payer: Self-pay

## 2021-04-01 ENCOUNTER — Inpatient Hospital Stay (HOSPITAL_COMMUNITY): Payer: 59

## 2021-04-01 ENCOUNTER — Ambulatory Visit (HOSPITAL_COMMUNITY): Payer: 59 | Admitting: Physician Assistant

## 2021-04-01 DIAGNOSIS — Z9221 Personal history of antineoplastic chemotherapy: Secondary | ICD-10-CM

## 2021-04-01 DIAGNOSIS — F32A Depression, unspecified: Secondary | ICD-10-CM | POA: Diagnosis present

## 2021-04-01 DIAGNOSIS — Z01818 Encounter for other preprocedural examination: Secondary | ICD-10-CM

## 2021-04-01 DIAGNOSIS — C3432 Malignant neoplasm of lower lobe, left bronchus or lung: Principal | ICD-10-CM | POA: Diagnosis present

## 2021-04-01 DIAGNOSIS — Z9012 Acquired absence of left breast and nipple: Secondary | ICD-10-CM

## 2021-04-01 DIAGNOSIS — Z79899 Other long term (current) drug therapy: Secondary | ICD-10-CM | POA: Diagnosis not present

## 2021-04-01 DIAGNOSIS — J9811 Atelectasis: Secondary | ICD-10-CM | POA: Diagnosis present

## 2021-04-01 DIAGNOSIS — Z923 Personal history of irradiation: Secondary | ICD-10-CM

## 2021-04-01 DIAGNOSIS — C3492 Malignant neoplasm of unspecified part of left bronchus or lung: Secondary | ICD-10-CM

## 2021-04-01 DIAGNOSIS — H8101 Meniere's disease, right ear: Secondary | ICD-10-CM

## 2021-04-01 DIAGNOSIS — Z79811 Long term (current) use of aromatase inhibitors: Secondary | ICD-10-CM

## 2021-04-01 DIAGNOSIS — F419 Anxiety disorder, unspecified: Secondary | ICD-10-CM | POA: Diagnosis present

## 2021-04-01 DIAGNOSIS — S40862A Insect bite (nonvenomous) of left upper arm, initial encounter: Secondary | ICD-10-CM

## 2021-04-01 DIAGNOSIS — Z20822 Contact with and (suspected) exposure to covid-19: Secondary | ICD-10-CM | POA: Diagnosis present

## 2021-04-01 DIAGNOSIS — Z7983 Long term (current) use of bisphosphonates: Secondary | ICD-10-CM | POA: Diagnosis not present

## 2021-04-01 DIAGNOSIS — Z853 Personal history of malignant neoplasm of breast: Secondary | ICD-10-CM | POA: Diagnosis not present

## 2021-04-01 DIAGNOSIS — Z4682 Encounter for fitting and adjustment of non-vascular catheter: Secondary | ICD-10-CM

## 2021-04-01 DIAGNOSIS — Z9049 Acquired absence of other specified parts of digestive tract: Secondary | ICD-10-CM | POA: Diagnosis not present

## 2021-04-01 DIAGNOSIS — J939 Pneumothorax, unspecified: Secondary | ICD-10-CM

## 2021-04-01 DIAGNOSIS — E785 Hyperlipidemia, unspecified: Secondary | ICD-10-CM | POA: Diagnosis present

## 2021-04-01 DIAGNOSIS — Z419 Encounter for procedure for purposes other than remedying health state, unspecified: Secondary | ICD-10-CM

## 2021-04-01 DIAGNOSIS — J9383 Other pneumothorax: Secondary | ICD-10-CM | POA: Diagnosis not present

## 2021-04-01 DIAGNOSIS — R911 Solitary pulmonary nodule: Secondary | ICD-10-CM | POA: Diagnosis not present

## 2021-04-01 DIAGNOSIS — Z902 Acquired absence of lung [part of]: Secondary | ICD-10-CM

## 2021-04-01 DIAGNOSIS — C50412 Malignant neoplasm of upper-outer quadrant of left female breast: Secondary | ICD-10-CM

## 2021-04-01 HISTORY — PX: FIDUCIAL MARKER PLACEMENT: SHX6858

## 2021-04-01 HISTORY — PX: VIDEO BRONCHOSCOPY WITH RADIAL ENDOBRONCHIAL ULTRASOUND: SHX6849

## 2021-04-01 HISTORY — PX: LYMPH NODE DISSECTION: SHX5087

## 2021-04-01 HISTORY — PX: INTERCOSTAL NERVE BLOCK: SHX5021

## 2021-04-01 SURGERY — WEDGE RESECTION, LUNG, ROBOT-ASSISTED, THORACOSCOPIC
Anesthesia: General | Site: Chest

## 2021-04-01 SURGERY — BRONCHOSCOPY, WITH BIOPSY USING ELECTROMAGNETIC NAVIGATION
Anesthesia: General

## 2021-04-01 MED ORDER — FENTANYL CITRATE (PF) 250 MCG/5ML IJ SOLN
INTRAMUSCULAR | Status: AC
  Filled 2021-04-01: qty 5

## 2021-04-01 MED ORDER — PROPOFOL 10 MG/ML IV BOLUS
INTRAVENOUS | Status: AC
  Filled 2021-04-01: qty 20

## 2021-04-01 MED ORDER — HEMOSTATIC AGENTS (NO CHARGE) OPTIME
TOPICAL | Status: DC | PRN
  Administered 2021-04-01 (×2): 1 via TOPICAL

## 2021-04-01 MED ORDER — SENNOSIDES-DOCUSATE SODIUM 8.6-50 MG PO TABS
1.0000 | ORAL_TABLET | Freq: Every day | ORAL | Status: DC
  Administered 2021-04-01: 1 via ORAL
  Filled 2021-04-01 (×2): qty 1

## 2021-04-01 MED ORDER — BUPIVACAINE LIPOSOME 1.3 % IJ SUSP
INTRAMUSCULAR | Status: AC
  Filled 2021-04-01: qty 20

## 2021-04-01 MED ORDER — IPRATROPIUM-ALBUTEROL 0.5-2.5 (3) MG/3ML IN SOLN: 3.0000 mL | Freq: Four times a day (QID) | RESPIRATORY_TRACT | Status: DC | PRN

## 2021-04-01 MED ORDER — PHENYLEPHRINE HCL (PRESSORS) 10 MG/ML IV SOLN
INTRAVENOUS | Status: DC | PRN
  Administered 2021-04-01: 100 ug via INTRAVENOUS

## 2021-04-01 MED ORDER — PAROXETINE HCL ER 12.5 MG PO TB24
25.0000 mg | ORAL_TABLET | Freq: Every day | ORAL | Status: DC
  Administered 2021-04-02: 09:00:00 25 mg via ORAL
  Filled 2021-04-01 (×2): qty 2

## 2021-04-01 MED ORDER — SODIUM CHLORIDE FLUSH 0.9 % IV SOLN
INTRAVENOUS | Status: DC | PRN
  Administered 2021-04-01: 88 mL

## 2021-04-01 MED ORDER — LACTATED RINGERS IV SOLN: INTRAVENOUS | Status: DC

## 2021-04-01 MED ORDER — LIDOCAINE 2% (20 MG/ML) 5 ML SYRINGE
INTRAMUSCULAR | Status: DC | PRN
  Administered 2021-04-01: 60 mg via INTRAVENOUS

## 2021-04-01 MED ORDER — KETOROLAC TROMETHAMINE 30 MG/ML IJ SOLN
INTRAMUSCULAR | Status: AC
  Filled 2021-04-01: qty 1

## 2021-04-01 MED ORDER — CEFAZOLIN SODIUM-DEXTROSE 2-4 GM/100ML-% IV SOLN
2.0000 g | Freq: Three times a day (TID) | INTRAVENOUS | Status: AC
  Administered 2021-04-01 – 2021-04-02 (×2): 2 g via INTRAVENOUS
  Filled 2021-04-01 (×2): qty 100

## 2021-04-01 MED ORDER — FENTANYL CITRATE (PF) 100 MCG/2ML IJ SOLN
INTRAMUSCULAR | Status: AC
  Filled 2021-04-01: qty 2

## 2021-04-01 MED ORDER — ACETAMINOPHEN 160 MG/5ML PO SOLN: 1000.0000 mg | Freq: Four times a day (QID) | ORAL | Status: DC

## 2021-04-01 MED ORDER — PHENYLEPHRINE 40 MCG/ML (10ML) SYRINGE FOR IV PUSH (FOR BLOOD PRESSURE SUPPORT)
PREFILLED_SYRINGE | INTRAVENOUS | Status: AC
  Filled 2021-04-01: qty 10

## 2021-04-01 MED ORDER — LACTATED RINGERS IV SOLN: INTRAVENOUS | Status: DC | PRN

## 2021-04-01 MED ORDER — ONDANSETRON HCL 4 MG/2ML IJ SOLN
INTRAMUSCULAR | Status: DC | PRN
  Administered 2021-04-01: 4 mg via INTRAVENOUS

## 2021-04-01 MED ORDER — ONDANSETRON HCL 4 MG/2ML IJ SOLN
INTRAMUSCULAR | Status: AC
  Filled 2021-04-01: qty 2

## 2021-04-01 MED ORDER — ROSUVASTATIN CALCIUM 5 MG PO TABS
10.0000 mg | ORAL_TABLET | Freq: Every day | ORAL | Status: DC
  Administered 2021-04-01 – 2021-04-02 (×2): 10 mg via ORAL
  Filled 2021-04-01 (×3): qty 2

## 2021-04-01 MED ORDER — GLYCOPYRROLATE 0.2 MG/ML IJ SOLN
INTRAMUSCULAR | Status: DC | PRN
  Administered 2021-04-01: .2 mg via INTRAVENOUS

## 2021-04-01 MED ORDER — MIDAZOLAM HCL 2 MG/2ML IJ SOLN
INTRAMUSCULAR | Status: AC
  Filled 2021-04-01: qty 2

## 2021-04-01 MED ORDER — METHYLENE BLUE 0.5 % INJ SOLN: INTRAVENOUS | Status: DC | PRN

## 2021-04-01 MED ORDER — TRAMADOL HCL 50 MG PO TABS
50.0000 mg | ORAL_TABLET | Freq: Four times a day (QID) | ORAL | Status: DC | PRN
  Administered 2021-04-01: 100 mg via ORAL
  Filled 2021-04-01: qty 2

## 2021-04-01 MED ORDER — ORAL CARE MOUTH RINSE: 15.0000 mL | Freq: Once | OROMUCOSAL | Status: AC

## 2021-04-01 MED ORDER — BISACODYL 5 MG PO TBEC
10.0000 mg | DELAYED_RELEASE_TABLET | Freq: Every day | ORAL | Status: DC
  Administered 2021-04-01 – 2021-04-02 (×2): 10 mg via ORAL
  Filled 2021-04-01 (×2): qty 2

## 2021-04-01 MED ORDER — PROPOFOL 10 MG/ML IV BOLUS
INTRAVENOUS | Status: DC | PRN
  Administered 2021-04-01: 200 mg via INTRAVENOUS

## 2021-04-01 MED ORDER — SUGAMMADEX SODIUM 200 MG/2ML IV SOLN
INTRAVENOUS | Status: DC | PRN
  Administered 2021-04-01: 200 mg via INTRAVENOUS

## 2021-04-01 MED ORDER — 0.9 % SODIUM CHLORIDE (POUR BTL) OPTIME
TOPICAL | Status: DC | PRN
  Administered 2021-04-01: 2000 mL

## 2021-04-01 MED ORDER — CHLORHEXIDINE GLUCONATE 0.12 % MT SOLN
15.0000 mL | Freq: Once | OROMUCOSAL | Status: AC
  Administered 2021-04-01: 15 mL via OROMUCOSAL
  Filled 2021-04-01: qty 15

## 2021-04-01 MED ORDER — SODIUM CHLORIDE 0.9 % IV SOLN
INTRAVENOUS | Status: DC | PRN
  Administered 2021-04-01: 1000 mL via INTRAMUSCULAR

## 2021-04-01 MED ORDER — ANASTROZOLE 1 MG PO TABS
1.0000 mg | ORAL_TABLET | Freq: Every day | ORAL | Status: DC
  Administered 2021-04-01 – 2021-04-02 (×2): 1 mg via ORAL
  Filled 2021-04-01 (×2): qty 1

## 2021-04-01 MED ORDER — ROCURONIUM BROMIDE 10 MG/ML (PF) SYRINGE
PREFILLED_SYRINGE | INTRAVENOUS | Status: AC
  Filled 2021-04-01: qty 20

## 2021-04-01 MED ORDER — DEXAMETHASONE SODIUM PHOSPHATE 10 MG/ML IJ SOLN
INTRAMUSCULAR | Status: DC | PRN
  Administered 2021-04-01: 10 mg via INTRAVENOUS

## 2021-04-01 MED ORDER — FENTANYL CITRATE (PF) 250 MCG/5ML IJ SOLN
INTRAMUSCULAR | Status: DC | PRN
  Administered 2021-04-01: 100 ug via INTRAVENOUS
  Administered 2021-04-01 (×5): 50 ug via INTRAVENOUS
  Administered 2021-04-01: 100 ug via INTRAVENOUS
  Administered 2021-04-01 (×2): 50 ug via INTRAVENOUS

## 2021-04-01 MED ORDER — ENOXAPARIN SODIUM 40 MG/0.4ML IJ SOSY
40.0000 mg | PREFILLED_SYRINGE | Freq: Every day | INTRAMUSCULAR | Status: DC
  Administered 2021-04-02: 07:00:00 40 mg via SUBCUTANEOUS
  Filled 2021-04-01: qty 0.4

## 2021-04-01 MED ORDER — MIDAZOLAM HCL 2 MG/2ML IJ SOLN
INTRAMUSCULAR | Status: DC | PRN
  Administered 2021-04-01: 2 mg via INTRAVENOUS

## 2021-04-01 MED ORDER — FENTANYL CITRATE (PF) 100 MCG/2ML IJ SOLN
25.0000 ug | INTRAMUSCULAR | Status: DC | PRN
  Administered 2021-04-01: 50 ug via INTRAVENOUS

## 2021-04-01 MED ORDER — BUPIVACAINE HCL (PF) 0.5 % IJ SOLN
INTRAMUSCULAR | Status: AC
  Filled 2021-04-01: qty 30

## 2021-04-01 MED ORDER — DEXAMETHASONE SODIUM PHOSPHATE 10 MG/ML IJ SOLN
INTRAMUSCULAR | Status: AC
  Filled 2021-04-01: qty 1

## 2021-04-01 MED ORDER — KETOROLAC TROMETHAMINE 15 MG/ML IJ SOLN
15.0000 mg | Freq: Four times a day (QID) | INTRAMUSCULAR | Status: DC
  Administered 2021-04-01 – 2021-04-03 (×7): 15 mg via INTRAVENOUS
  Filled 2021-04-01 (×6): qty 1

## 2021-04-01 MED ORDER — DONEPEZIL HCL 10 MG PO TABS
10.0000 mg | ORAL_TABLET | Freq: Every day | ORAL | Status: DC
  Administered 2021-04-01 – 2021-04-02 (×2): 10 mg via ORAL
  Filled 2021-04-01 (×2): qty 1

## 2021-04-01 MED ORDER — OXYCODONE HCL 5 MG PO TABS: 5.0000 mg | ORAL_TABLET | ORAL | Status: DC | PRN

## 2021-04-01 MED ORDER — PHENYLEPHRINE HCL-NACL 20-0.9 MG/250ML-% IV SOLN
INTRAVENOUS | Status: DC | PRN
Start: 2021-04-01 — End: 2021-04-01
  Administered 2021-04-01: 15 ug/min via INTRAVENOUS

## 2021-04-01 MED ORDER — VASOPRESSIN 20 UNIT/ML IV SOLN
INTRAVENOUS | Status: AC
  Filled 2021-04-01: qty 1

## 2021-04-01 MED ORDER — IPRATROPIUM-ALBUTEROL 0.5-2.5 (3) MG/3ML IN SOLN
3.0000 mL | Freq: Two times a day (BID) | RESPIRATORY_TRACT | Status: DC
  Administered 2021-04-02 – 2021-04-03 (×3): 3 mL via RESPIRATORY_TRACT
  Filled 2021-04-01 (×3): qty 3

## 2021-04-01 MED ORDER — ROCURONIUM BROMIDE 10 MG/ML (PF) SYRINGE
PREFILLED_SYRINGE | INTRAVENOUS | Status: DC | PRN
  Administered 2021-04-01: 70 mg via INTRAVENOUS
  Administered 2021-04-01: 50 mg via INTRAVENOUS
  Administered 2021-04-01: 30 mg via INTRAVENOUS

## 2021-04-01 MED ORDER — ONDANSETRON HCL 4 MG/2ML IJ SOLN: 4.0000 mg | Freq: Four times a day (QID) | INTRAMUSCULAR | Status: DC | PRN

## 2021-04-01 MED ORDER — MORPHINE SULFATE (PF) 2 MG/ML IV SOLN: 2.0000 mg | INTRAVENOUS | Status: DC | PRN

## 2021-04-01 MED ORDER — LIDOCAINE 2% (20 MG/ML) 5 ML SYRINGE
INTRAMUSCULAR | Status: AC
  Filled 2021-04-01: qty 5

## 2021-04-01 MED ORDER — CEFAZOLIN SODIUM-DEXTROSE 2-4 GM/100ML-% IV SOLN
2.0000 g | INTRAVENOUS | Status: AC
  Administered 2021-04-01: 2 g via INTRAVENOUS
  Filled 2021-04-01: qty 100

## 2021-04-01 MED ORDER — METOCLOPRAMIDE HCL 5 MG/ML IJ SOLN
10.0000 mg | Freq: Four times a day (QID) | INTRAMUSCULAR | Status: AC
  Administered 2021-04-01 – 2021-04-02 (×4): 10 mg via INTRAVENOUS
  Filled 2021-04-01 (×4): qty 2

## 2021-04-01 MED ORDER — ACETAMINOPHEN 500 MG PO TABS
1000.0000 mg | ORAL_TABLET | Freq: Once | ORAL | Status: AC
  Administered 2021-04-01: 1000 mg via ORAL
  Filled 2021-04-01: qty 2

## 2021-04-01 MED ORDER — ACETAMINOPHEN 500 MG PO TABS
1000.0000 mg | ORAL_TABLET | Freq: Four times a day (QID) | ORAL | Status: DC
  Administered 2021-04-01 – 2021-04-03 (×6): 1000 mg via ORAL
  Filled 2021-04-01 (×6): qty 2

## 2021-04-01 MED ORDER — SODIUM CHLORIDE 0.45 % IV SOLN: INTRAVENOUS | Status: DC

## 2021-04-01 MED ORDER — IPRATROPIUM-ALBUTEROL 0.5-2.5 (3) MG/3ML IN SOLN
3.0000 mL | Freq: Four times a day (QID) | RESPIRATORY_TRACT | Status: DC
  Administered 2021-04-01: 3 mL via RESPIRATORY_TRACT
  Filled 2021-04-01: qty 3

## 2021-04-01 SURGICAL SUPPLY — 111 items
APPLIER CLIP ROT 10 11.4 M/L (STAPLE)
BLADE CLIPPER SURG (BLADE) ×2 IMPLANT
BNDG COHESIVE 6X5 TAN STRL LF (GAUZE/BANDAGES/DRESSINGS) IMPLANT
CANISTER SUCT 3000ML PPV (MISCELLANEOUS) ×8 IMPLANT
CANNULA REDUC XI 12-8 STAPL (CANNULA) ×6
CANNULA REDUC XI 12-8MM STAPL (CANNULA) ×2
CANNULA REDUCER 12-8 DVNC XI (CANNULA) ×4 IMPLANT
CLIP APPLIE ROT 10 11.4 M/L (STAPLE) IMPLANT
CLIP VESOCCLUDE MED 6/CT (CLIP) IMPLANT
CNTNR URN SCR LID CUP LEK RST (MISCELLANEOUS) ×10 IMPLANT
CONN ST 1/4X3/8  BEN (MISCELLANEOUS)
CONN ST 1/4X3/8 BEN (MISCELLANEOUS) IMPLANT
CONT SPEC 4OZ STRL OR WHT (MISCELLANEOUS) ×20
DEFOGGER SCOPE WARMER CLEARIFY (MISCELLANEOUS) ×4 IMPLANT
DERMABOND ADHESIVE PROPEN (GAUZE/BANDAGES/DRESSINGS) ×2
DERMABOND ADVANCED (GAUZE/BANDAGES/DRESSINGS) ×2
DERMABOND ADVANCED .7 DNX12 (GAUZE/BANDAGES/DRESSINGS) ×2 IMPLANT
DERMABOND ADVANCED .7 DNX6 (GAUZE/BANDAGES/DRESSINGS) IMPLANT
DRAIN CHANNEL 28F RND 3/8 FF (WOUND CARE) IMPLANT
DRAIN CHANNEL 32F RND 10.7 FF (WOUND CARE) IMPLANT
DRAPE ARM DVNC X/XI (DISPOSABLE) ×8 IMPLANT
DRAPE COLUMN DVNC XI (DISPOSABLE) ×2 IMPLANT
DRAPE CV SPLIT W-CLR ANES SCRN (DRAPES) ×4 IMPLANT
DRAPE DA VINCI XI ARM (DISPOSABLE) ×20
DRAPE DA VINCI XI COLUMN (DISPOSABLE) ×4
DRAPE HALF SHEET 40X57 (DRAPES) ×4 IMPLANT
DRAPE INCISE IOBAN 66X45 STRL (DRAPES) ×2 IMPLANT
DRAPE ORTHO SPLIT 77X108 STRL (DRAPES) ×4
DRAPE SURG ORHT 6 SPLT 77X108 (DRAPES) ×2 IMPLANT
ELECT BLADE 6.5 EXT (BLADE) IMPLANT
ELECT REM PT RETURN 9FT ADLT (ELECTROSURGICAL) ×4
ELECTRODE REM PT RTRN 9FT ADLT (ELECTROSURGICAL) ×2 IMPLANT
GAUZE 4X4 16PLY ~~LOC~~+RFID DBL (SPONGE) ×2 IMPLANT
GAUZE KITTNER 4X5 RF (MISCELLANEOUS) ×4 IMPLANT
GAUZE SPONGE 4X4 12PLY STRL (GAUZE/BANDAGES/DRESSINGS) ×6 IMPLANT
GAUZE SPONGE 4X4 12PLY STRL LF (GAUZE/BANDAGES/DRESSINGS) ×2 IMPLANT
GLOVE SURG MICRO LTX SZ7.5 (GLOVE) ×8 IMPLANT
GOWN STRL REUS W/ TWL LRG LVL3 (GOWN DISPOSABLE) ×4 IMPLANT
GOWN STRL REUS W/ TWL XL LVL3 (GOWN DISPOSABLE) ×4 IMPLANT
GOWN STRL REUS W/TWL 2XL LVL3 (GOWN DISPOSABLE) ×6 IMPLANT
GOWN STRL REUS W/TWL LRG LVL3 (GOWN DISPOSABLE) ×8
GOWN STRL REUS W/TWL XL LVL3 (GOWN DISPOSABLE) ×8
HEMOSTAT SURGICEL 2X14 (HEMOSTASIS) ×10 IMPLANT
IRRIGATION STRYKERFLOW (MISCELLANEOUS) ×2 IMPLANT
IRRIGATOR STRYKERFLOW (MISCELLANEOUS) ×4
KIT BASIN OR (CUSTOM PROCEDURE TRAY) ×4 IMPLANT
KIT SUCTION CATH 14FR (SUCTIONS) IMPLANT
KIT TURNOVER KIT B (KITS) ×4 IMPLANT
NDL HYPO 25GX1X1/2 BEV (NEEDLE) ×2 IMPLANT
NDL SPNL 22GX3.5 QUINCKE BK (NEEDLE) ×2 IMPLANT
NEEDLE HYPO 25GX1X1/2 BEV (NEEDLE) ×4 IMPLANT
NEEDLE SPNL 22GX3.5 QUINCKE BK (NEEDLE) ×4 IMPLANT
NS IRRIG 1000ML POUR BTL (IV SOLUTION) ×6 IMPLANT
PACK CHEST (CUSTOM PROCEDURE TRAY) ×4 IMPLANT
PAD ARMBOARD 7.5X6 YLW CONV (MISCELLANEOUS) ×12 IMPLANT
PORT ACCESS TROCAR AIRSEAL 12 (TROCAR) ×2 IMPLANT
PORT ACCESS TROCAR AIRSEAL 5M (TROCAR) ×2
RELOAD STAPLE 45 3.5 BLU DVNC (STAPLE) IMPLANT
RELOAD STAPLE 45 4.3 GRN DVNC (STAPLE) IMPLANT
RELOAD STAPLE 45 4.6 BLK DVNC (STAPLE) IMPLANT
RELOAD STAPLER 3.5X45 BLU DVNC (STAPLE) ×2 IMPLANT
RELOAD STAPLER 4.3X45 GRN DVNC (STAPLE) ×2 IMPLANT
RELOAD STAPLER 45 4.6 BLK DVNC (STAPLE) ×10 IMPLANT
SCISSORS LAP 5X35 DISP (ENDOMECHANICALS) IMPLANT
SEAL CANN UNIV 5-8 DVNC XI (MISCELLANEOUS) ×4 IMPLANT
SEAL XI 5MM-8MM UNIVERSAL (MISCELLANEOUS) ×8
SEALANT PROGEL (MISCELLANEOUS) IMPLANT
SET TRI-LUMEN FLTR TB AIRSEAL (TUBING) ×4 IMPLANT
SOLUTION ELECTROLUBE (MISCELLANEOUS) ×4 IMPLANT
SPONGE INTESTINAL PEANUT (DISPOSABLE) IMPLANT
SPONGE T-LAP 18X18 ~~LOC~~+RFID (SPONGE) ×10 IMPLANT
SPONGE T-LAP 4X18 ~~LOC~~+RFID (SPONGE) ×2 IMPLANT
SPONGE TONSIL TAPE 1 RFD (DISPOSABLE) ×2 IMPLANT
STAPLER CANNULA SEAL DVNC XI (STAPLE) ×4 IMPLANT
STAPLER CANNULA SEAL XI (STAPLE) ×8
STAPLER RELOAD 3.5X45 BLU DVNC (STAPLE) ×2
STAPLER RELOAD 3.5X45 BLUE (STAPLE) ×4
STAPLER RELOAD 4.3X45 GREEN (STAPLE) ×4
STAPLER RELOAD 4.3X45 GRN DVNC (STAPLE) ×2
STAPLER RELOAD 45 4.6 BLK (STAPLE) ×20
STAPLER RELOAD 45 4.6 BLK DVNC (STAPLE) ×10
SUT PDS AB 3-0 SH 27 (SUTURE) IMPLANT
SUT PROLENE 4 0 RB 1 (SUTURE) ×4
SUT PROLENE 4-0 RB1 .5 CRCL 36 (SUTURE) IMPLANT
SUT SILK  1 MH (SUTURE) ×4
SUT SILK 1 MH (SUTURE) ×4 IMPLANT
SUT SILK 1 TIES 10X30 (SUTURE) IMPLANT
SUT SILK 2 0 SH (SUTURE) ×2 IMPLANT
SUT SILK 2 0SH CR/8 30 (SUTURE) IMPLANT
SUT SILK 3 0SH CR/8 30 (SUTURE) IMPLANT
SUT VIC AB 1 CTX 36 (SUTURE)
SUT VIC AB 1 CTX36XBRD ANBCTR (SUTURE) IMPLANT
SUT VIC AB 2-0 CTX 36 (SUTURE) IMPLANT
SUT VIC AB 3-0 MH 27 (SUTURE) IMPLANT
SUT VIC AB 3-0 X1 27 (SUTURE) ×8 IMPLANT
SUT VICRYL 0 TIES 12 18 (SUTURE) ×4 IMPLANT
SUT VICRYL 0 UR6 27IN ABS (SUTURE) ×8 IMPLANT
SUT VICRYL 2 TP 1 (SUTURE) IMPLANT
SYR 20ML ECCENTRIC (SYRINGE) ×2 IMPLANT
SYR 20ML LL LF (SYRINGE) ×8 IMPLANT
SYSTEM RETRIEVAL ANCHOR 12 (MISCELLANEOUS) ×2 IMPLANT
SYSTEM SAHARA CHEST DRAIN ATS (WOUND CARE) ×4 IMPLANT
TAPE CLOTH 4X10 WHT NS (GAUZE/BANDAGES/DRESSINGS) ×4 IMPLANT
TAPE CLOTH SURG 4X10 WHT LF (GAUZE/BANDAGES/DRESSINGS) ×2 IMPLANT
TIP APPLICATOR SPRAY EXTEND 16 (VASCULAR PRODUCTS) IMPLANT
TOWEL GREEN STERILE (TOWEL DISPOSABLE) ×8 IMPLANT
TRAY FOLEY MTR SLVR 16FR STAT (SET/KITS/TRAYS/PACK) ×4 IMPLANT
TROCAR BLADELESS 15MM (ENDOMECHANICALS) IMPLANT
TROCAR XCEL 12X100 BLDLESS (ENDOMECHANICALS) IMPLANT
TROCAR XCEL BLADELESS 5X75MML (TROCAR) IMPLANT
WATER STERILE IRR 1000ML POUR (IV SOLUTION) ×4 IMPLANT

## 2021-04-01 NOTE — Op Note (Signed)
Video Bronchoscopy with Robotic Assisted Bronchoscopic Navigation   Date of Operation: 04/01/2021   Pre-op Diagnosis: Left lower lobe pulmonary nodule  Post-op Diagnosis: Same  Surgeon: Baltazar Apo  Assistants: Jaynee Eagles  Anesthesia: General endotracheal anesthesia  Operation: Flexible video fiberoptic bronchoscopy with robotic assistance and biopsies.  Estimated Blood Loss: Minimal  Complications: None  Indications and History: [name] is a 61 y.o. woman with history of breast cancer.  She is a never smoker and was found here if a groundglass pulmonary nodule in the peripheral left lower lobe, probably slightly larger going back on serial scans.  She is planning for robotic VATS wedge resection.  Presents now for navigational bronchoscopy and dye marking of the peripheral lesion. The risks, benefits, complications, treatment options and expected outcomes were discussed with the patient.  The possibilities of pneumothorax, pneumonia, reaction to medication, pulmonary aspiration, perforation of a viscus, bleeding, failure to diagnose a condition and creating a complication requiring transfusion or operation were discussed with the patient who freely signed the consent.    Description of Procedure: The patient was seen in the Preoperative Area, was examined and was deemed appropriate to proceed.  The patient was taken to [facility] endoscopy room 3, identified as [name] and the procedure verified as Flexible Video Fiberoptic Bronchoscopy.  A Time Out was held and the above information confirmed.   Prior to the date of the procedure a high-resolution CT scan of the chest was performed. Utilizing ION software program a virtual tracheobronchial tree was generated to allow the creation of distinct navigation pathways to the patient's parenchymal abnormalities. After being taken to the operating room general anesthesia was initiated and the patient  was orally intubated. The video  fiberoptic bronchoscope was introduced via the endotracheal tube and a general inspection was performed which showed normal right and left lung anatomy, aspiration of the bilateral mainstems was completed to remove any remaining secretions. Robotic catheter inserted into patient's endotracheal tube.   Target #1 left lower lobe groundglass nodule: The distinct navigation pathways prepared prior to this procedure were then utilized to navigate to patient's lesion identified on CT scan. The robotic catheter was secured into place and the vision probe was withdrawn.  Lesion location was approximated using fluoroscopy and radial endobronchial ultrasound for peripheral targeting.  Local registration and targeting was performed using [employer] three-dimensional imaging.  Under fluoroscopic guidance 1.5 cc mixed methylene blue/isocyanate green was injected in the region of the lesion to facilitate VATS resection.  At the end of the procedure a general airway inspection was performed and there was no evidence of active bleeding. The bronchoscope was removed.  The patient tolerated the procedure well. There was no significant blood loss and there were no obvious complications.   Samples  None   Plans:  The patient will transition to the operating room for her robotic VATS resection of the pulmonary nodule.  Please refer also to Dr. Leonarda Salon op note.  Baltazar Apo, MD, PhD 04/01/2021, 3:25 PM  Pulmonary and Critical Care [phone] or if no answer before 7:00PM call [phone] For any issues after 7:00PM please call eLink [phone]

## 2021-04-01 NOTE — Anesthesia Postprocedure Evaluation (Signed)
Anesthesia Post Note  Patient: [name]  Procedure(s) Performed: ROBOTIC ASSISTED NAVIGATIONAL BRONCHOSCOPY fiducial dye marking VIDEO BRONCHOSCOPY WITH RADIAL ENDOBRONCHIAL ULTRASOUND XI ROBOTIC ASSISTED THORASCOPY-WEDGE RESECTION LEFT LOWER LOBE (Left: Chest) LYMPH NODE DISSECTION INTERCOSTAL NERVE BLOCK (Left: Chest)     Patient location during evaluation: PACU Anesthesia Type: General Level of consciousness: awake Pain management: pain level controlled Vital Signs Assessment: post-procedure vital signs reviewed and stable Respiratory status: spontaneous breathing Cardiovascular status: stable Postop Assessment: no apparent nausea or vomiting Anesthetic complications: no   No notable events documented.  Last Vitals:  Vitals:   04/01/21 0858  BP: (!) 158/83  Pulse: 66  Resp: 17  Temp: 36.9 C  SpO2: 96%    Last Pain:  Vitals:   04/01/21 0923  TempSrc:   PainSc: 0-No pain                 Taelor Waymire

## 2021-04-01 NOTE — Interval H&P Note (Signed)
History and Physical Interval Note:  04/01/2021 2:29 PM  [name]  has presented today for surgery, with the diagnosis of LLL NODULE.  The various methods of treatment have been discussed with the patient and family. After consideration of risks, benefits and other options for treatment, the patient has consented to  Procedure(s): XI ROBOTIC ASSISTED THORASCOPY-WEDGE RESECTION (Left) LYMPH NODE DISSECTION (N/A) as a surgical intervention.  The patient's history has been reviewed, patient examined, no change in status, stable for surgery.  I have reviewed the patient's chart and labs.  Questions were answered to the patient's satisfaction.     Melrose Nakayama

## 2021-04-01 NOTE — Transfer of Care (Signed)
Immediate Anesthesia Transfer of Care Note  Patient: [name]  Procedure(s) Performed: ROBOTIC ASSISTED NAVIGATIONAL BRONCHOSCOPY fiducial dye marking VIDEO BRONCHOSCOPY WITH RADIAL ENDOBRONCHIAL ULTRASOUND XI ROBOTIC ASSISTED THORASCOPY-WEDGE RESECTION LEFT LOWER LOBE (Left: Chest) LYMPH NODE DISSECTION INTERCOSTAL NERVE BLOCK (Left: Chest)  Patient Location: PACU  Anesthesia Type:General  Level of Consciousness: awake, alert  and oriented  Airway & Oxygen Therapy: Patient Spontanous Breathing  Post-op Assessment: Report given to RN and Post -op Vital signs reviewed and stable  Post vital signs: Reviewed and stable  Last Vitals:  Vitals Value Taken Time  BP 150/100 04/01/21 1858  Temp    Pulse 68 04/01/21 1903  Resp 15 04/01/21 1903  SpO2 97 % 04/01/21 1903  Vitals shown include unvalidated device data.  Last Pain:  Vitals:   04/01/21 0923  TempSrc:   PainSc: 0-No pain         Complications: No notable events documented.

## 2021-04-01 NOTE — Anesthesia Procedure Notes (Signed)
Procedure Name: Intubation Date/Time: 04/01/2021 3:45 PM Performed by: Eligha Bridegroom, CRNA Pre-anesthesia Checklist: Patient identified, Emergency Drugs available, Suction available, Patient being monitored and Timeout performed Patient Re-evaluated:Patient Re-evaluated prior to induction Oxygen Delivery Method: Circle system utilized Laryngoscope Size: Glidescope Grade View: Grade III Endobronchial tube: Left and Double lumen EBT and 35 Fr Number of attempts: 2 Airway Equipment and Method: Stylet and Video-laryngoscopy Placement Confirmation: positive ETCO2 and breath sounds checked- equal and bilateral Tube secured with: Tape Dental Injury: Teeth and Oropharynx as per pre-operative assessment

## 2021-04-01 NOTE — Brief Op Note (Addendum)
04/01/2021  6:16 PM  PATIENT:  [name]  61 y.o. female  PRE-OPERATIVE DIAGNOSIS:  LLL NODULE  POST-OPERATIVE DIAGNOSIS:  LLL NODULE  PROCEDURE:  Procedure(s):  XI ROBOTIC ASSISTED THORACOSCOPY  WEDGE RESECTION LEFT LOWER LOBE (Left)  LYMPH NODE SAMPLING (N/A)  INTERCOSTAL NERVE BLOCK (Left)  SURGEON:  Surgeon(s) and Role:    * Melrose Nakayama, MD - Primary  PHYSICIAN ASSISTANT: Ellwood Handler PA-C  ASSISTANTS: none   ANESTHESIA:   general  EBL:  Per anesthesia record  BLOOD ADMINISTERED:none  DRAINS:  28 [address]    LOCAL MEDICATIONS USED: Exparel  SPECIMEN:  Source of Specimen:  Lymph Nodes, Left Lower Lobe Wedge Resection  DISPOSITION OF SPECIMEN:  PATHOLOGY  COUNTS:  YES  TOURNIQUET:  * No tourniquets in log *  DICTATION: .Dragon Dictation  PLAN OF CARE: Admit to inpatient   PATIENT DISPOSITION:  PACU - hemodynamically stable.   Delay start of Pharmacological VTE agent (>24hrs) due to surgical blood loss or risk of bleeding: no

## 2021-04-01 NOTE — Anesthesia Preprocedure Evaluation (Addendum)
Anesthesia Evaluation  Patient identified by MRN, date of birth, ID band Patient awake    Reviewed: Allergy & Precautions, NPO status , Patient's Chart, lab work & pertinent test results  Airway Mallampati: III  TM Distance: >3 FB Neck ROM: Full    Dental no notable dental hx. (+) Teeth Intact, Dental Advisory Given   Pulmonary neg pulmonary ROS,  CT chest abdomen pelvis 02/28/2021 shows scattered right axillary nodes without any concerning characteristics, benign 3 mm right middle lobe nodule, some left upper lobe reticular scar from radiation, 9 x 5 x 6 mm mixed density subpleural left lower lobe nodule, probably slightly larger over time compared with prior scans back to 2019.   Pulmonary exam normal breath sounds clear to auscultation       Cardiovascular + CAD  Normal cardiovascular exam Rhythm:Regular Rate:Normal  HLD  TTE 2016 - Normal LV size and systolic function, EF 55%. Abnormal global  longitudinal strain at -15.4%. Normal RV size and systolic  function. No significant valvular abnormalities.   Neuro/Psych PSYCHIATRIC DISORDERS Anxiety Depression negative neurological ROS     GI/Hepatic negative GI ROS, Neg liver ROS,   Endo/Other  negative endocrine ROS  Renal/GU negative Renal ROS  negative genitourinary   Musculoskeletal  (+) Arthritis ,   Abdominal   Peds  Hematology negative hematology ROS (+)   Anesthesia Other Findings 61 year old never smoker with a history of left breast cancer (lumpectomy, chemo, XRT, anastrozole) found to have abnormal CT scan of the chest and PET scan.  Reproductive/Obstetrics L breast CA                            Anesthesia Physical Anesthesia Plan  ASA: 3  Anesthesia Plan: General   Post-op Pain Management: Tylenol PO (pre-op)   Induction: Intravenous  PONV Risk Score and Plan: 3 and Midazolam, Dexamethasone and Ondansetron  Airway  Management Planned: Oral ETT  Additional Equipment: Arterial line  Intra-op Plan:   Post-operative Plan: Extubation in OR  Informed Consent: I have reviewed the patients History and Physical, chart, labs and discussed the procedure including the risks, benefits and alternatives for the proposed anesthesia with the patient or authorized representative who has indicated his/her understanding and acceptance.     Dental advisory given  Plan Discussed with: CRNA  Anesthesia Plan Comments:         Anesthesia Quick Evaluation

## 2021-04-01 NOTE — Anesthesia Procedure Notes (Signed)
Arterial Line Insertion Start/End1/23/2023 1:15 PM Performed by: Janace Litten, CRNA, CRNA  Preanesthetic checklist: patient identified, monitors and equipment checked and pre-op evaluation Lidocaine 1% used for infiltration Left, radial was placed Catheter size: 20 G Hand hygiene performed  and maximum sterile barriers used   Attempts: 1 Procedure performed without using ultrasound guided technique. Following insertion, dressing applied and Biopatch. Post procedure assessment: normal

## 2021-04-01 NOTE — Hospital Course (Addendum)
HPI: This is a 61 year old non-smoker with a past history significant for breast cancer, hyperlipidemia, Mnire's, allergies, anxiety, and depression.  She had a stage IIb (T2, N1C) lobular carcinoma of the left breast in 2016.  She was treated with lumpectomy, radiation, and chemotherapy.   Just before Christmas she was having pain in her right groin radiating down her right leg.  A CT of the chest abdomen and pelvis was performed as well as a duplex to rule out DVT.  The duplex was negative for DVT.  CT showed a slight enlargement of a subpleural subsolid nodule in the left lower lobe.  This was in comparison to a scan from 2019.  A PET/CT showed no significant metabolic activity in the nodule.  She saw Dr. Lamonte Sakai.  After talking with him she wanted to pursue surgical resection.  She denies any chest pain, pressure, tightness, shortness of breath, cough, wheezing, change in appetite, weight loss, headaches, or visual changes.  Dr. Roxan Hockey discussed the three potential options:radiographic follow up, bronchoscopic or CT guided biopsy or surgical resection. Dr. Roxan Hockey discussed the advantages, disadvantages, and potential risks, complications and benefits of each. Patient decided on surgical resection.  Hospital Course: She underwent a flexible video fiberoptic bronchoscopy with robotic assistance and biopsies followed by [facility] robotic assisted left video thoracoscopy with wedge resection left lower lobe, lymph node dissection, and intercostal nerve block.  She tolerated the procedure without difficulty, was extubated and taken to the PACU in stable condition. Chest tube was to water seal and there was no air leak. Chest tube was removed on 01/24. Same day chest x ray showed a small left apical pneumothorax and atelectasis. She is ambulating on room air with good oxygenation. She is tolerating a diet. Her wounds are clean, dry, and healing without signs of infection. PA/LAT CXR on 01/25 showed  unchanged small left apical pneumothorax. As discussed with Dr. Roxan Hockey, the patient is felt surgically stable for discharge today. Patient requests prescription for Ultram for pain, which was electronically sent to her pharmacy.

## 2021-04-01 NOTE — Anesthesia Procedure Notes (Signed)
Procedure Name: Intubation Date/Time: 04/01/2021 2:27 PM Performed by: Clearnce Sorrel, CRNA Pre-anesthesia Checklist: Patient identified, Emergency Drugs available, Suction available and Patient being monitored Patient Re-evaluated:Patient Re-evaluated prior to induction Oxygen Delivery Method: Circle System Utilized Preoxygenation: Pre-oxygenation with 100% oxygen Induction Type: IV induction Ventilation: Mask ventilation without difficulty Laryngoscope Size: Mac, 3 and McGraph (DLx1 MP3 unsuccessful.) Grade View: Grade I Tube type: Oral Tube size: 8.5 mm Number of attempts: 1 Airway Equipment and Method: Stylet and Oral airway Placement Confirmation: ETT inserted through vocal cords under direct vision, positive ETCO2 and breath sounds checked- equal and bilateral Secured at: 19 cm Tube secured with: Tape Dental Injury: Teeth and Oropharynx as per pre-operative assessment

## 2021-04-01 NOTE — Interval H&P Note (Signed)
History and Physical Interval Note:  04/01/2021 9:39 AM  [name]  has presented today for surgery, with the diagnosis of lung nodule.  The various methods of treatment have been discussed with the patient and family. After consideration of risks, benefits and other options for treatment, the patient has consented to  Procedure(s): ROBOTIC ASSISTED NAVIGATIONAL BRONCHOSCOPY (N/A) as a surgical intervention.  The patient's history has been reviewed, patient examined, no change in status, stable for surgery.  I have reviewed the patient's chart and labs.  Questions were answered to the patient's satisfaction.     Collene Gobble

## 2021-04-02 ENCOUNTER — Inpatient Hospital Stay (HOSPITAL_COMMUNITY): Payer: 59

## 2021-04-02 ENCOUNTER — Encounter (HOSPITAL_COMMUNITY): Payer: Self-pay | Admitting: Thoracic Surgery (Cardiothoracic Vascular Surgery)

## 2021-04-02 ENCOUNTER — Telehealth: Payer: Self-pay | Admitting: *Deleted

## 2021-04-02 LAB — BASIC METABOLIC PANEL
Anion gap: 10 (ref 5–15)
BUN: 7 mg/dL (ref 6–20)
CO2: 23 mmol/L (ref 22–32)
Calcium: 8.4 mg/dL — ABNORMAL LOW (ref 8.9–10.3)
Chloride: 103 mmol/L (ref 98–111)
Creatinine, Ser: 0.87 mg/dL (ref 0.44–1.00)
GFR, Estimated: >60 mL/min (ref >60)
Glucose, Bld: 159 mg/dL — ABNORMAL HIGH (ref 70–99)
Potassium: 3.3 mmol/L — ABNORMAL LOW (ref 3.5–5.1)
Sodium: 136 mmol/L (ref 135–145)

## 2021-04-02 LAB — CBC
HCT: 38.3 % (ref 36.0–46.0)
Hemoglobin: 12.9 g/dL (ref 12.0–15.0)
MCH: 32.1 pg (ref 26.0–34.0)
MCHC: 33.7 g/dL (ref 30.0–36.0)
MCV: 95.3 fL (ref 80.0–100.0)
Platelets: 173 10*3/uL (ref 150–400)
RBC: 4.02 MIL/uL (ref 3.87–5.11)
RDW: 12.7 % (ref 11.5–15.5)
WBC: 13.3 10*3/uL — ABNORMAL HIGH (ref 4.0–10.5)
nRBC: 0 % (ref 0.0–0.2)

## 2021-04-02 MED ORDER — POTASSIUM CHLORIDE CRYS ER 20 MEQ PO TBCR
40.0000 meq | EXTENDED_RELEASE_TABLET | Freq: Two times a day (BID) | ORAL | Status: AC
  Administered 2021-04-02 (×2): 40 meq via ORAL
  Filled 2021-04-02 (×2): qty 2

## 2021-04-02 MED ORDER — CLONAZEPAM 0.5 MG PO TABS: 1.5000 mg | ORAL_TABLET | ORAL | Status: DC

## 2021-04-02 MED ORDER — ANASTROZOLE 1 MG PO TABS: 1.0000 mg | ORAL_TABLET | Freq: Every day | ORAL | Status: DC

## 2021-04-02 MED ORDER — POTASSIUM CHLORIDE ER 10 MEQ PO TBCR: 10.0000 meq | EXTENDED_RELEASE_TABLET | Freq: Every day | ORAL | Status: DC | PRN

## 2021-04-02 MED ORDER — MECLIZINE HCL 25 MG PO TABS
25.0000 mg | ORAL_TABLET | Freq: Three times a day (TID) | ORAL | Status: DC | PRN
  Filled 2021-04-02: qty 1

## 2021-04-02 MED ORDER — FUROSEMIDE 20 MG PO TABS: 20.0000 mg | ORAL_TABLET | Freq: Every day | ORAL | Status: DC | PRN

## 2021-04-02 MED ORDER — CHLORHEXIDINE GLUCONATE CLOTH 2 % EX PADS
6.0000 | MEDICATED_PAD | Freq: Every day | CUTANEOUS | Status: DC
  Administered 2021-04-02: 09:00:00 6 via TOPICAL

## 2021-04-02 MED ORDER — TRIAMCINOLONE ACETONIDE 0.1 % EX CREA: 1.0000 "application " | TOPICAL_CREAM | Freq: Two times a day (BID) | CUTANEOUS | Status: AC | PRN

## 2021-04-02 NOTE — Telephone Encounter (Signed)
Goodness. She should be fully recovered from her surgery before considering routine surveillance colonoscopy. She could postpone plans for colonoscopy 3 to 6 months if needed.  Thanks

## 2021-04-02 NOTE — Plan of Care (Signed)

## 2021-04-02 NOTE — Discharge Instructions (Signed)
Robot-Assisted Thoracic Surgery, Care After The following information offers guidance on how to care for yourself after your procedure. Your health care provider may also give you more specific instructions. If you have problems or questions, contact your health care provider. What can I expect after the procedure? After the procedure, it is common to have: Some pain and aches in the area of your surgical incisions. Pain when breathing in (inhaling) and coughing. Tiredness (fatigue). Trouble sleeping. Constipation. Follow these instructions at home: Medicines Take over-the-counter and prescription medicines only as told by your health care provider. If you were prescribed an antibiotic medicine, take it as told by your health care provider. Do not stop taking the antibiotic even if you start to feel better. Talk with your health care provider about safe and effective ways to manage pain after your procedure. Pain management should fit your specific health needs. Take pain medicine before pain becomes severe. Relieving and controlling your pain will make breathing easier for you. Ask your health care provider if the medicine prescribed to you requires you to avoid driving or using machinery. Eating and drinking Follow instructions from your health care provider about eating or drinking restrictions. These will vary depending on what procedure you had. Your health care provider may recommend: A liquid diet or soft diet for the first few days. Meals that are smaller and more frequent. A diet of fruits, vegetables, whole grains, and low-fat proteins. Limiting foods that are high in fat and processed sugar, including fried or sweet foods. Incision care Follow instructions from your health care provider about how to take care of your incisions. Make sure you: Wash your hands with soap and water for at least 20 seconds before and after you change your bandage (dressing). If soap and water are not  available, use hand sanitizer. Change your dressing as told by your health care provider. Leave stitches (sutures), skin glue, or adhesive strips in place. These skin closures may need to stay in place for 2 weeks or longer. If adhesive strip edges start to loosen and curl up, you may trim the loose edges. Do not remove adhesive strips completely unless your health care provider tells you to do that. Check your incision area every day for signs of infection. Check for: Redness, swelling, or more pain. Fluid or blood. Warmth. Pus or a bad smell. Activity Return to your normal activities as told by your health care provider. Ask your health care provider what activities are safe for you. Ask your health care provider when it is safe for you to drive. Do not lift anything that is heavier than 10 lb (4.5 kg), or the limit that you are told, until your health care provider says that it is safe. Rest as told by your health care provider. Avoid sitting for a long time without moving. Get up to take short walks every 1-2 hours. This is important to improve blood flow and breathing. Ask for help if you feel weak or unsteady. Do exercises as told by your health care provider. Pneumonia prevention Do deep breathing exercises and cough regularly as directed. This helps clear mucus and opens your lungs. Doing this helps prevent lung infection (pneumonia). If you were given an incentive spirometer, use it as told. An incentive spirometer is a tool that measures how well you are filling your lungs with each breath. Coughing may hurt less if you try to support your chest. This is called splinting. Try one of these when you cough:  Hold a pillow against your chest. Place the palms of both hands on top of your incision area. Do not use any products that contain nicotine or tobacco. These products include cigarettes, chewing tobacco, and vaping devices, such as e-cigarettes. If you need help quitting, ask your  health care provider. Avoid secondhand smoke. General instructions If you have a drainage tube: Follow instructions from your health care provider about how to take care of it. Do not travel by airplane after your tube is removed until your health care provider tells you it is safe. You may need to take these actions to prevent or treat constipation: Drink enough fluid to keep your urine pale yellow. Take over-the-counter or prescription medicines. Eat foods that are high in fiber, such as beans, whole grains, and fresh fruits and vegetables. Limit foods that are high in fat and processed sugars, such as fried or sweet foods. Keep all follow-up visits. This is important. Contact a health care provider if: You have redness, swelling, or more pain around an incision. You have fluid or blood coming from an incision. An incision feels warm to the touch. You have pus or a bad smell coming from an incision. You have a fever. You cannot eat or drink without vomiting. Your pain medicine is not controlling your pain. Get help right away if: You have chest pain. Your heart is beating quickly. You have trouble breathing. You have trouble speaking. You are confused. You feel weak or dizzy, or you faint. These symptoms may represent a serious problem that is an emergency. Do not wait to see if the symptoms will go away. Get medical help right away. Call your local emergency services (911 in the [place] Do not drive yourself to the hospital. Summary Talk with your health care provider about safe and effective ways to manage pain after your procedure. Pain management should fit your specific health needs. Return to your normal activities as told by your health care provider. Ask your health care provider what activities are safe for you. Do deep breathing exercises and cough regularly as directed. This helps to clear mucus and prevent pneumonia. If it hurts to cough, ease pain by holding a pillow  against your chest or by placing the palms of both hands over your incisions. This information is not intended to replace advice given to you by your health care provider. Make sure you discuss any questions you have with your health care provider. Document Revised: 11/18/2019 Document Reviewed: 11/18/2019 Elsevier Patient Education  2022 [employer].

## 2021-04-02 NOTE — Plan of Care (Signed)
°  Problem: Education: Goal: Knowledge of General Education information will improve Description: Including pain rating scale, medication(s)/side effects and non-pharmacologic comfort measures Outcome: Progressing   Problem: Health Behavior/Discharge Planning: Goal: Ability to manage health-related needs will improve Outcome: Progressing   Problem: Clinical Measurements: Goal: Ability to maintain clinical measurements within normal limits will improve Outcome: Progressing Goal: Will remain free from infection Outcome: Progressing   Problem: Activity: Goal: Risk for activity intolerance will decrease Outcome: Progressing

## 2021-04-02 NOTE — Telephone Encounter (Signed)
Dr Henrene Pastor, This pt is scheduled for a recall colon for a hx of polyps 05-10-2021 Friday , last colon 02-18-2018. She had a partial lung lobectomy yesterday 04-01-2021, path pending, for a lung nodule. She is still admitted.    Do you think she should post pone her colon more than 5 weeks post op from this surgery?  Please advise- thanks for your time, Marijean Niemann

## 2021-04-02 NOTE — Telephone Encounter (Signed)
°  Called husband at [facility] for him to return call   Spoke with Marya Amsler - explained Decklyn needs to wait 3-6 months for her colon- will place recall for 07-2021- explained can call after 3 months post surgery if well and can RS- husband verbalized understanding   Recall [phone]

## 2021-04-02 NOTE — Discharge Summary (Addendum)
Physician Discharge Summary       [address]       [place] [address]             [phone]    Patient ID: [name] MRN: [id number] DOB/AGE: 1961/02/11 61 y.o.  Admit date: 04/01/2021 Discharge date: 04/03/2021  Admission Diagnoses: Left lower lobe lung nodule   Discharge Diagnoses:  Adenocarcinoma of the lung- pathologic stage IB(T2a,N0) S/p Xi robotic-assisted left thoracoscopy, left lower lobe wedge resection, lymph node sampling and intercostal nerve blocks levels 3 through 10. History of the following: Abnormal Pap smear of cervix 2002     Dr. Eilleen Kempf    Allergy 2012    seasonal   Anxiety     Arthritis     Benign hemangioma      Dr,  Marybelle Killings    Breast cancer [facility]) 07/20/14    Left Breast   Cervical stenosis (uterine cervix) 2002   Depression     Dizziness 1991    secondary to Meniere's disease   Hyperlipidemia 2007   Insomnia 1997   Meniere's disease 1991   Nervousness(799.21)     Consults: None  Procedure (s):  Flexible video fiberoptic bronchoscopy with robotic assistance and biopsies. Xi robotic-assisted left thoracoscopy, left lower lobe wedge resection, lymph node sampling and intercostal nerve blocks levels 3 through 10 by Dr. Roxan Hockey on 04/01/2021.  Pathology: Final result is pending  HPI: This is a 60 year old non-smoker with a past history significant for breast cancer, hyperlipidemia, Mnire's, allergies, anxiety, and depression.  She had a stage IIb (T2, N1C) lobular carcinoma of the left breast in 2016.  She was treated with lumpectomy, radiation, and chemotherapy.   Just before Christmas she was having pain in her right groin radiating down her right leg.  A CT of the chest abdomen and pelvis was performed as well as a duplex to rule out DVT.  The duplex was negative for DVT.  CT showed a slight enlargement of a subpleural subsolid nodule in the left lower lobe.  This was in comparison to a scan from 2019.  A  PET/CT showed no significant metabolic activity in the nodule.  She saw Dr. Lamonte Sakai.  After talking with him she wanted to pursue surgical resection.  She denies any chest pain, pressure, tightness, shortness of breath, cough, wheezing, change in appetite, weight loss, headaches, or visual changes.  Dr. Roxan Hockey discussed the three potential options:radiographic follow up, bronchoscopic or CT guided biopsy or surgical resection. Dr. Roxan Hockey discussed the advantages, disadvantages, and potential risks, complications and benefits of each. Patient decided on surgical resection.  Hospital Course: She underwent a flexible video fiberoptic bronchoscopy with robotic assistance and biopsies followed by [facility] robotic assisted left video thoracoscopy with wedge resection left lower lobe, lymph node dissection, and intercostal nerve block.  She tolerated the procedure without difficulty, was extubated and taken to the PACU in stable condition. Chest tube was to water seal and there was no air leak. Chest tube was removed on 01/24. Same day chest x ray showed a small left apical pneumothorax and atelectasis. She is ambulating on room air with good oxygenation. She is tolerating a diet. Her wounds are clean, dry, and healing without signs of infection. PA/LAT CXR on 01/25 showed unchanged small left apical pneumothorax. As discussed with Dr. Roxan Hockey, the patient is felt surgically stable for discharge today. Patient requests prescription for Ultram for pain, which was electronically sent to her pharmacy.  Latest Vital Signs: Blood pressure (!) 152/77, pulse 73, temperature 98.4 F (36.9 C), temperature source Oral, resp. rate 18, height 5\' 5"  (1.651 m), weight 77.1 kg, SpO2 94 %.  Physical Exam: Cardiovascular: RRR Pulmonary: Clear to auscultation bilaterally Abdomen: Soft, non tender, bowel sounds present. Extremities: No lower extremity edema. Wounds: Clean and dry.  No erythema or signs of  infection.  Discharge Condition:Stable condition and discharged to home.  Recent laboratory studies:  Lab Results  Component Value Date   WBC 11.6 (H) 04/03/2021   HGB 11.7 (L) 04/03/2021   HCT 36.5 04/03/2021   MCV 97.6 04/03/2021   PLT 172 04/03/2021   Lab Results  Component Value Date   NA 139 04/03/2021   K 4.2 04/03/2021   CL 106 04/03/2021   CO2 26 04/03/2021   CREATININE 0.65 04/03/2021   GLUCOSE 113 (H) 04/03/2021      Diagnostic Studies: DG Chest 2 View  Result Date: 04/03/2021 CLINICAL DATA:  Follow-up pneumothorax EXAM: CHEST - 2 VIEW COMPARISON:  Chest x-ray dated April 02, 2021 FINDINGS: Cardiac and mediastinal contours are unchanged within normal limits. Left basilar atelectasis. Unchanged size of small left apical pneumothorax. No pleural effusion. IMPRESSION: Unchanged size of small left apical pneumothorax. Electronically Signed   By: Yetta Glassman M.D.   On: 04/03/2021 08:27   DG Chest 2 View  Result Date: 04/01/2021 CLINICAL DATA:  Preoperative study EXAM: CHEST - 2 VIEW COMPARISON:  Chest x-ray 09/18/2014, CT chest 03/29/2021 FINDINGS: Heart size and mediastinal contours are within normal limits. No suspicious pulmonary opacities identified. Surgical clips in the left chest. No pleural effusion or pneumothorax visualized. No acute osseous abnormality appreciated. IMPRESSION: No acute process identified. Electronically Signed   By: Ofilia Neas M.D.   On: 04/01/2021 11:22   NM Bone Scan Whole Body  Result Date: 03/05/2021 CLINICAL DATA:  Breast cancer. EXAM: NUCLEAR MEDICINE WHOLE BODY BONE SCAN TECHNIQUE: Whole body anterior and posterior images were obtained approximately 3 hours after intravenous injection of radiopharmaceutical. RADIOPHARMACEUTICALS:  Twenty mCi Technetium-91m MDP IV COMPARISON:  CT chest, abdomen, and pelvis dated February 28, 2021. PET-CT dated November 26, 2016. Bone scan dated December 20, 2014. FINDINGS: There are no foci of  increased or decreased radiotracer uptake to suggest osseous metastatic disease. Degenerative type uptake in both shoulders, knees, and ankles. Normal physiologic activity is identified within the kidneys and urinary bladder. IMPRESSION: Normal bone scan.  No evidence of osseous metastatic disease. Electronically Signed   By: Titus Dubin M.D.   On: 03/05/2021 15:54   NM PET Image Initial (PI) Skull Base To Thigh  Result Date: 03/08/2021 CLINICAL DATA:  Initial treatment strategy for pulmonary nodule in a patient with a history of breast cancer. EXAM: NUCLEAR MEDICINE PET SKULL BASE TO THIGH TECHNIQUE: 7.716 mCi F-18 FDG was injected intravenously. Full-ring PET imaging was performed from the skull base to thigh after the radiotracer. CT data was obtained and used for attenuation correction and anatomic localization. Fasting blood glucose: 87 mg/dl COMPARISON:  CT chest abdomen and pelvis 02/28/2021 FINDINGS: Mediastinal blood pool activity: SUV max 2.44 Liver activity: SUV max NA NECK: No hypermetabolic lymph nodes in the neck. Incidental CT findings: none CHEST: No significant tracer uptake associated with the 9 mm subcentimeter, subpleural, subsolid nodule within the posterior left lower lobe, image 101/6. No tracer avid axillary, supraclavicular, mediastinal, or hilar lymph nodes. Incidental CT findings: Subpleural fibrotic changes within the anterior left upper lobe are likely the sequelae of  external beam radiation. Aortic atherosclerosis. ABDOMEN/PELVIS: No abnormal hypermetabolic activity within the liver, pancreas, adrenal glands, or spleen. No hypermetabolic lymph nodes in the abdomen or pelvis. Incidental CT findings: Status post cholecystectomy. SKELETON: No focal hypermetabolic activity to suggest skeletal metastasis. Incidental CT findings: none IMPRESSION: 1. No significant tracer uptake associated with the sub solid nodule within the left lower lobe. Adenocarcinoma cannot be excluded. Follow  up by CT is recommended in 12 months, with continued annual surveillance for a minimum of 3 years.These recommendations are taken from: Recommendations for the Management of Subsolid Pulmonary Nodules Detected at CT: A Statement from the [facility] 2013; 266:1, 304-317. 2. No signs of tracer avid recurrent breast cancer or metastatic disease. 3.  Aortic Atherosclerosis (ICD10-I70.0). Electronically Signed   By: Kerby Moors M.D.   On: 03/08/2021 10:53   DG Chest Port 1 View  Result Date: 04/02/2021 CLINICAL DATA:  Left chest tube removal EXAM: PORTABLE CHEST 1 VIEW COMPARISON:  Previous studies including the examination done earlier today FINDINGS: There is interval removal of left chest tube. There is small left apical pneumothorax measuring 10 mm in maximum diameter. Subcutaneous emphysema is seen in the left side of neck and left chest wall. There are linear densities in the left lower lung fields. There is blunting of left lateral CP angle. Right lung is clear. IMPRESSION: Small left apical pneumothorax. Linear densities in the left lower lung fields suggest subsegmental atelectasis. Electronically Signed   By: Elmer Picker M.D.   On: 04/02/2021 11:59   DG Chest Port 1 View  Result Date: 04/02/2021 CLINICAL DATA:  Status post robotic assisted wedge resection of the left lower lobe yesterday. EXAM: PORTABLE CHEST 1 VIEW COMPARISON:  Radiographs 04/01/2021 and 03/15/2008.  CT 03/29/2021. FINDINGS: 0640 hours. Left chest tube remains in place. There is stable volume loss and mild opacity inferiorly in the left hemithorax. The right lung is clear. There is no pneumothorax or significant pleural effusion. The heart size and mediastinal contours are stable. There is stable soft tissue emphysema in the lower left neck. Surgical clips are present within the left axilla. IMPRESSION: Stable postoperative chest.  No evidence of pneumothorax. Electronically Signed   By: Richardean Sale  M.D.   On: 04/02/2021 09:34   DG Chest Port 1 View  Result Date: 04/01/2021 CLINICAL DATA:  Status post lung lobectomy. EXAM: PORTABLE CHEST 1 VIEW COMPARISON:  Preoperative study earlier today at 9:24 a.m. FINDINGS: There is a left chest tube newly noted entering from the lower lateral aspect with tip in the medial apex. There is no visible pneumothorax. The lungs are generally clear aside from linear atelectatic foci in the lateral left base. The heart projects larger than previously but no vascular congestion is seen. This could be related to AP magnification and decreased lung excursion. No pleural collection is seen. There is calcification in the aortic arch with stable mediastinal configuration. There is osteopenia with degenerative changes of the spine. Old surgical clips again noted left axilla, left chest wall and evidence of at least a partial prior left mastectomy. IMPRESSION: 1. Left chest tube in place with tip in the medial apex. No visible pneumothorax postoperatively. 2. Linear atelectatic foci in the lateral left base. The visualized lungs otherwise clear allowing for a lower inspiration. 3. The heart projecting larger than previously, possibly related to decreased lung volumes or AP magnification, possibly fluid overload related. Pericardial effusion not strictly excluded. There is no central vascular engorgement. Electronically Signed  By: Telford Nab M.D.   On: 04/01/2021 21:11   CT Super D Chest Wo Contrast  Result Date: 03/30/2021 CLINICAL DATA:  Pulmonary nodule, resection scheduled EXAM: CT CHEST WITHOUT CONTRAST TECHNIQUE: Multidetector CT imaging of the chest was performed using thin slice collimation for electromagnetic bronchoscopy planning purposes, without intravenous contrast. RADIATION DOSE REDUCTION: This exam was performed according to the departmental dose-optimization program which includes automated exposure control, adjustment of the mA and/or kV according to patient  size and/or use of iterative reconstruction technique. COMPARISON:  PET-CT, 03/07/2021 FINDINGS: Cardiovascular: Aortic atherosclerosis. Normal heart size. No pericardial effusion. Mediastinum/Nodes: No enlarged mediastinal, hilar, or axillary lymph nodes. Thyroid gland, trachea, and esophagus demonstrate no significant findings. Lungs/Pleura: Unchanged subsolid, subpleural nodule of the dependent left lower lobe, measuring 0.9 x 0.6 cm (series 5, image 107). Subpleural radiation fibrosis of the anterior left upper lobe and lingula (series 5, image 63). No pleural effusion or pneumothorax. Upper Abdomen: No acute abnormality. Musculoskeletal: Left lumpectomy and axillary lymph node dissection. No suspicious osseous lesions identified. IMPRESSION: 1. Unchanged subsolid, subpleural nodule of the dependent left lower lobe, measuring 0.9 x 0.6 cm. This remains morphologically suspicious for indolent adenocarcinoma. 2. Subpleural radiation fibrosis of the anterior left upper lobe and lingula. 3. Left lumpectomy and axillary lymph node dissection. Aortic Atherosclerosis (ICD10-I70.0). Electronically Signed   By: Delanna Ahmadi M.D.   On: 03/30/2021 19:32   DG C-ARM BRONCHOSCOPY  Result Date: 04/01/2021 C-ARM BRONCHOSCOPY: Fluoroscopy was utilized by the requesting physician.  No radiographic interpretation.     Discharge Medications: Allergies as of 04/03/2021       Reactions   Statins Other (See Comments)   Muscle pain   Lipitor [atorvastatin] Other (See Comments)   myalgias   Temazepam Other (See Comments)   Pt does not remember (Restoril)        Medication List     TAKE these medications    acetaminophen 500 MG tablet Commonly known as: TYLENOL Take 1,500 mg by mouth daily as needed for moderate pain.   alendronate 70 MG tablet Commonly known as: FOSAMAX TAKE 1 TABLET ONCE A WEEK. TAKE WITH A FULL GLASS OF WATER ON AN EMPTY STOMACH.   anastrozole 1 MG tablet Commonly known as:  ARIMIDEX Take 1 tablet (1 mg total) by mouth at bedtime.   Biotin 10000 MCG Tabs Take 10,000 mcg by mouth daily.   CALCIUM 600 + D PO Take 1 tablet by mouth at bedtime.   clobetasol cream 0.05 % Commonly known as: TEMOVATE Apply 1 application topically 2 (two) times daily as needed (irritation).   clonazePAM 0.5 MG tablet Commonly known as: KLONOPIN Take 3 tablets (1.5 mg total) by mouth See admin instructions. Take 1.5 mg in the morning, may take an additional 0.5 mg tablet 3 times daily as needed for meniere's attack   donepezil 10 MG tablet Commonly known as: ARICEPT Take 1 tablet (10 mg total) by mouth at bedtime.   furosemide 20 MG tablet Commonly known as: LASIX Take 1 tablet (20 mg total) by mouth daily as needed for edema.   meclizine 25 MG tablet Commonly known as: ANTIVERT TAKE 1 TABLET (25 MG TOTAL) BY MOUTH 3 (THREE) TIMES DAILY AS NEEDED FOR DIZZINESS.   PARoxetine 25 MG 24 hr tablet Commonly known as: PAXIL-CR Take 1 tablet (25 mg total) by mouth daily.   potassium chloride 10 MEQ tablet Commonly known as: KLOR-CON Take 1 tablet (10 mEq total) by mouth daily as needed (  when taking lasix).   rosuvastatin 10 MG tablet Commonly known as: CRESTOR Take 1 tablet (10 mg total) by mouth daily.   traMADol 50 MG tablet Commonly known as: ULTRAM Take 1 tablet (50 mg total) by mouth every 6 (six) hours as needed (mild pain).   triamcinolone cream 0.1 % Commonly known as: KENALOG Apply 1 application topically 2 (two) times daily as needed (irritation).   Turmeric 500 MG Tabs Take 1 tablet by mouth 2 (two) times daily.   vitamin B-12 1000 MCG tablet Commonly known as: CYANOCOBALAMIN Take 1,000 mcg by mouth at bedtime.   Vitamin D (Ergocalciferol) 1.25 MG (50000 UNIT) Caps capsule Commonly known as: DRISDOL TAKE 1 CAPSULE (50,000 UNITS TOTAL) BY MOUTH EVERY 7 (SEVEN) DAYS.        Follow Up Appointments:  Follow-up Information     Melrose Nakayama, MD. Go on 04/23/2021.   Specialty: Cardiothoracic Surgery Why: PA/LAT CXR (to be taken at [facility] which is in the same building as Dr. Leonarda Salon office) on 02/14 at 1:00 pmt;Appointment time is at 1: 30 Contact information: [address] [place] [address] [phone]         [facility] [place]. Go on 04/15/2021.   Specialty: Cardiothoracic Surgery Why: Appointment time is at 11:00 am and is with nurse only for chest tube suture removal Contact information: [place], [place] [phone]                Signed: Sharalyn Ink [facility] 04/03/2021, 1:19 PM

## 2021-04-02 NOTE — Progress Notes (Addendum)
° °   °  [address]       [place] [address]             [phone]       1 Day Post-Op Procedure(s) (LRB): XI ROBOTIC ASSISTED THORASCOPY-WEDGE RESECTION LEFT LOWER LOBE (Left) LYMPH NODE DISSECTION (N/A) INTERCOSTAL NERVE BLOCK (Left)  Subjective: Patient states pain under good control. She wants to walk. She is tolerating diet without problems.  Objective: Vital signs in last 24 hours: Temp:  [97.4 F (36.3 C)-98.4 F (36.9 C)] 98.4 F (36.9 C) (01/24 0350) Pulse Rate:  [66-82] 68 (01/24 0350) Cardiac Rhythm: Normal sinus rhythm;Other (Comment) (01/23 2043) Resp:  [12-18] 16 (01/24 0350) BP: (113-158)/(60-100) 113/60 (01/24 0350) SpO2:  [93 %-99 %] 93 % (01/24 0350) Arterial Line BP: (146-176)/(52-69) 146/52 (01/23 1945) Weight:  [76.2 kg-77.1 kg] 77.1 kg (01/23 2033)     Intake/Output from previous day: 01/23 0701 - 01/24 0700 In: 1325.7 [P.O.:600; I.V.:435.2; IV Piggyback:190.4] Out: 675 [Urine:275; Blood:150; Chest Tube:250]   Physical Exam:  Cardiovascular: RRR Pulmonary: Clear to auscultation bilaterally Abdomen: Soft, non tender, bowel sounds present. Extremities: Trace lower extremity edema. Wounds: Clean and dry.  No erythema or signs of infection. Chest Tube:to water seal, no air leak  Lab Results: CBC: Recent Labs    04/02/21 0115  WBC 13.3*  HGB 12.9  HCT 38.3  PLT 173   BMET:  Recent Labs    04/02/21 0115  NA 136  K 3.3*  CL 103  CO2 23  GLUCOSE 159*  BUN 7  CREATININE 0.87  CALCIUM 8.4*    PT/INR: No results for input(s): LABPROT, INR in the last 72 hours. ABG:  INR: Will add last result for INR, ABG once components are confirmed Will add last 4 CBG results once components are confirmed  Assessment/Plan:  1. CV - SR with HR in the 70's. 2.  Pulmonary - On 2 liters of oxygen via [place]. Will wean over next few days. Chest tube with 250 cc since surgery. Chest tube is to water seal, no air leak. CXR this am  appears stable. Possibly remove chest tube.Encourage incentive spirometer. Await final pathology for staging. 3. Supplement potassium 4. Decrease IVF and remove foley 5. On Lovenox for DVT prophylaxis  Donielle M ZimmermanPA-C 04/02/2021,7:15 AM [phone]   Patient seen and examined, agree with above No air leak, minimal drainage, CXR OK- dc chest tube   Remo Lipps C. Roxan Hockey, MD [facility] [phone]

## 2021-04-02 NOTE — Op Note (Signed)
NAME: [name], [name] MEDICAL RECORD NO: [id number] ACCOUNT NO: [id number] DATE OF BIRTH: 23-Mar-1960 FACILITY: [facility] LOCATION: [facility] PHYSICIAN: Revonda Standard. Roxan Hockey, MD  Operative Report   DATE OF PROCEDURE: 04/01/2021  PREOPERATIVE DIAGNOSIS:  Left lower lobe lung nodule.  POSTOPERATIVE DIAGNOSIS:  Left lower lobe lung nodule.  PROCEDURE:   Xi robotic-assisted left thoracoscopy,  Left lower lobe wedge resection,  Lymph node sampling and  Intercostal nerve blocks levels 3 through 10.  SURGEON:  Revonda Standard. Roxan Hockey, MD  ASSISTANT:  Ellwood Handler, PA  ANESTHESIA:  General.  FINDINGS:  Frozen section revealed atypical glandular proliferation, suspicious for an adenocarcinoma, margin negative.  INDICATIONS:  Mrs. Dilley is a 61 year old woman with a history of breast cancer.  She has had a nodule in her right lung for several years and over time, it has slowly enlarged.  This is a subpleural ground-glass nodule in the left lower lobe.  There  was no significant activity on PET CT.  Options of radiographic followup and surgical resection were discussed with the patient.  She wished to proceed with surgical resection.  The indications, risks, benefits, and alternatives were discussed in detail  with the patient.  She understood and accepted the risks and agreed to proceed.  Due to the small size of the nodule, the plan was to have Dr. Baltazar Apo perform robotic bronchoscopy for marking prior to her wedge resection.  DESCRIPTION OF PROCEDURE:  Mrs. Melling was taken to the endoscopy suite where Dr. Lamonte Sakai performed a robotic bronchoscopy and marking of the nodule. There were some technical difficulties.  Please see his separately dictated note for full details of that  procedure.  He was able to mark in the vicinity of the nodule.  The patient then was transported to the operating room and Dr. Annye Asa switched out the endotracheal tube for a double lumen endotracheal tube.   There were also technical  difficulties in getting that tube in place, but ultimately the tube was placed and in a good position.  Intravenous antibiotics were administered.  Sequential compression devices were in place for DVT prophylaxis.  A Foley catheter was placed.  She was  placed in a right lateral decubitus position.  A Bair Hugger was placed for active warming.  The left chest was prepped and draped in the usual sterile fashion.  Single lung ventilation of the right lung was initiated and was tolerated well.  A timeout was performed.  A solution containing 20 mL of liposomal bupivacaine, 30 mL of 0.5% bupivacaine and 50 mL of saline was prepared.  This solution was used for the local at the incision sites as well as for the intercostal nerve blocks.  An incision was  made in the eighth interspace in the mid axillary line.  An 8 mm port was inserted.  The thoracoscope was advanced into the chest.  After confirming the intrapleural placement, carbon dioxide was insufflated per protocol.  A 12 mm port was placed in the  eighth interspace anterior to the camera port.  Intercostal nerve blocks then were performed from the 3rd to the 10th interspace by injecting 10 mL of the bupivacaine solution into a subpleural plane at each level.  Two additional eighth interspace ports  were placed and then a 12 mm AirSeal port was placed in the tenth interspace anterolaterally. The robot was deployed.  The camera arm was docked.  Targeting was performed.  The remaining arms were docked.  Robotic instruments were inserted with  thoracoscopic  visualization.  Inspection of the chest cavity showed some staining of the parietal pleura at about the eighth interspace adjacent to the area of the nodule.  Methylene blue was apparent in the mid portion of the lung posterolaterally.  With the [facility] setting on the robot, there was staining and diffusion throughout the lower lobe, which limited the utility of the ICG. While  waiting for the lung to deflate to make the wedge resection easier, the inferior ligament was divided with bipolar cautery.  Level 9 lymph nodes were removed.  The lung then was retracted anteriorly and the pleural reflection was divided at the hilum posteriorly.  Level 7 and 10 nodes were removed.  The nodes all appeared grossly normal.  There were some adhesions  of the upper lobe.  So the level 5 nodes were not approached at this time.  By this time, the lung was deflated enough to allow the wedge resection to proceed.  A robotic stapler was used to perform a wedge resection, removing a relatively large portion  of the posterior aspect of the left lower lobe.  This was done with sequential firings of the stapler using blue, green and black cartridges respectively.  The area marked with methylene blue was approximately in the center of the area and a minimum of 2  cm gross margin was maintained on that. After completing the wedge resection, the specimen was placed into an endoscopic retrieval bag.  It was removed.  The nodule was palpable just inferior to the methylene blue marking.  It was marked with a suture, and the specimen was sent for frozen section.  While awaiting the results of the frozen,  the adhesions to the upper lobe were taken down.  The level 5 nodes were removed.  The fissure was inspected.  There was a level 12 node associated with the lower lobe and that was  removed and sent for permanent pathology as well.  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  The  margin was clear.  A 28-French Blake drain was placed through the original port incision and directed to the apex.  It was secured with #1 silk suture.  The remaining robotic and AirSeal ports were removed.  Dual lung ventilation was resumed.  The  incisions were closed with 0  Vicryl fascial sutures and 3-0 Vicryl subcuticular sutures.  Dermabond was applied.  The chest tube was placed to a Pleur-Evac on waterseal.  She was placed back in the supine position, extubated in the operating room and  taken to the postanesthetic care unit in good condition.  Experienced assistance was necessary for this case due to complexity.  Erin Barrett performed that role assisting with port placement, instrument exchange, suctioning, specimen retrieval, and wound closure.  All sponge, needle and instrument counts were correct at the end of the procedure, the patient was taken from the operating room to the [facility], extubated and in good condition.   PUS D: 04/01/2021 6:56:43 pm T: 04/02/2021 4:43:00 am  JOB: [id number]/ [id number]

## 2021-04-03 ENCOUNTER — Inpatient Hospital Stay (HOSPITAL_COMMUNITY): Payer: 59

## 2021-04-03 LAB — CBC
HCT: 36.5 % (ref 36.0–46.0)
Hemoglobin: 11.7 g/dL — ABNORMAL LOW (ref 12.0–15.0)
MCH: 31.3 pg (ref 26.0–34.0)
MCHC: 32.1 g/dL (ref 30.0–36.0)
MCV: 97.6 fL (ref 80.0–100.0)
Platelets: 172 10*3/uL (ref 150–400)
RBC: 3.74 MIL/uL — ABNORMAL LOW (ref 3.87–5.11)
RDW: 12.9 % (ref 11.5–15.5)
WBC: 11.6 10*3/uL — ABNORMAL HIGH (ref 4.0–10.5)
nRBC: 0 % (ref 0.0–0.2)

## 2021-04-03 LAB — COMPREHENSIVE METABOLIC PANEL
ALT: 26 U/L (ref 0–44)
AST: 23 U/L (ref 15–41)
Albumin: 3.4 g/dL — ABNORMAL LOW (ref 3.5–5.0)
Alkaline Phosphatase: 38 U/L (ref 38–126)
Anion gap: 7 (ref 5–15)
BUN: 13 mg/dL (ref 6–20)
CO2: 26 mmol/L (ref 22–32)
Calcium: 8.5 mg/dL — ABNORMAL LOW (ref 8.9–10.3)
Chloride: 106 mmol/L (ref 98–111)
Creatinine, Ser: 0.65 mg/dL (ref 0.44–1.00)
GFR, Estimated: >60 mL/min (ref >60)
Glucose, Bld: 113 mg/dL — ABNORMAL HIGH (ref 70–99)
Potassium: 4.2 mmol/L (ref 3.5–5.1)
Sodium: 139 mmol/L (ref 135–145)
Total Bilirubin: 0.6 mg/dL (ref 0.3–1.2)
Total Protein: 5.6 g/dL — ABNORMAL LOW (ref 6.5–8.1)

## 2021-04-03 MED ORDER — TRAMADOL HCL 50 MG PO TABS: 50.0000 mg | ORAL_TABLET | Freq: Four times a day (QID) | ORAL | 0 refills | Status: DC | PRN

## 2021-04-03 NOTE — Plan of Care (Signed)

## 2021-04-03 NOTE — Progress Notes (Addendum)
° °   °  [address]       [place] [address]             [phone]       2 Days Post-Op Procedure(s) (LRB): XI ROBOTIC ASSISTED THORASCOPY-WEDGE RESECTION LEFT LOWER LOBE (Left) LYMPH NODE DISSECTION (N/A) INTERCOSTAL NERVE BLOCK (Left)  Subjective: Patient  just finished breathing treatment. She has no complaints;want to go home.  Objective: Vital signs in last 24 hours: Temp:  [98.5 F (36.9 C)-98.7 F (37.1 C)] 98.7 F (37.1 C) (01/25 0359) Pulse Rate:  [75-81] 78 (01/25 0359) Cardiac Rhythm: Normal sinus rhythm (01/25 0714) Resp:  [17-20] 20 (01/25 0359) BP: (115-148)/(64-77) 148/77 (01/25 0359) SpO2:  [94 %-95 %] 95 % (01/25 0359)     Intake/Output from previous day: 01/24 0701 - 01/25 0700 In: 624.7 [I.V.:624.7] Out: 345 [Urine:325; Chest Tube:20]   Physical Exam:  Cardiovascular: RRR Pulmonary: Clear to auscultation bilaterally Abdomen: Soft, non tender, bowel sounds present. Extremities: No lower extremity edema. Wounds: Clean and dry.  No erythema or signs of infection.   Lab Results: CBC: Recent Labs    04/02/21 0115 04/03/21 0113  WBC 13.3* 11.6*  HGB 12.9 11.7*  HCT 38.3 36.5  PLT 173 172    BMET:  Recent Labs    04/02/21 0115 04/03/21 0113  NA 136 139  K 3.3* 4.2  CL 103 106  CO2 23 26  GLUCOSE 159* 113*  BUN 7 13  CREATININE 0.87 0.65  CALCIUM 8.4* 8.5*     PT/INR: No results for input(s): LABPROT, INR in the last 72 hours. ABG:  INR: Will add last result for INR, ABG once components are confirmed Will add last 4 CBG results once components are confirmed  Assessment/Plan:  1. CV - SR  2.  Pulmonary - On room air. Chest tube removed yesterday. CXR this am appears to show small left apical pneumothorax. Encourage incentive spirometer. Await final pathology.  Await final pathology for staging. 3. On Lovenox for DVT prophylaxis 4. Discharge  Sharalyn Ink [facility] 04/03/2021,7:27 AM [phone]    Patient seen and examined, agree with above Home today  Revonda Standard. Roxan Hockey, MD [facility] [phone]

## 2021-04-03 NOTE — TOC Progression Note (Signed)
Transition of Care [facility]) - Progression Note    Patient Details  Name: [name] MRN: [id number] Date of Birth: [birth year]  Transition of Care [facility]) CM/SW Kevin, RN Phone Number:[phone]  04/03/2021, 8:42 AM  Clinical Narrative:     Transition of Care [facility]) Screening Note   Patient Details  Name: [name] Date of Birth: 05/08/1960   Transition of Care [facility]) CM/SW Contact:    Angelita Ingles, RN Phone Number: 04/03/2021, 8:42 AM    Transition of Care Department (TOC) has reviewed patient and no TOC needs have been identified at this time. We will continue to monitor patient advancement through interdisciplinary progression rounds. If new patient transition needs arise, please place a TOC consult.          Expected Discharge Plan and Services           Expected Discharge Date: 04/03/21                                     Social Determinants of Health (SDOH) Interventions    Readmission Risk Interventions No flowsheet data found.

## 2021-04-03 NOTE — Plan of Care (Signed)

## 2021-04-04 LAB — SURGICAL PATHOLOGY

## 2021-04-07 ENCOUNTER — Telehealth: Payer: Self-pay | Admitting: Cardiothoracic Surgery

## 2021-04-07 NOTE — Telephone Encounter (Signed)
The patient called for advice after being discharged earlier this week following left VATS lower lobe wedge resection of a pulmonary nodule.  She noticed a fever of 99.8 associated with increased coughing and some decrease in the volume of her instant incentive spirometry effort.  She denies any symptoms of UTI.  She denies shortness of breath or any incisional drainage.  She has an appointment to be seen by Dr. Koleen Nimrod in our office later this week with a chest x-ray.  She will be given a 5-day course of oral Omnicef 300 mg twice daily for her fever and increased cough to treat possible bronchitis.  She will call back if her temperature reaches 101.

## 2021-04-09 ENCOUNTER — Telehealth: Payer: Self-pay

## 2021-04-09 NOTE — Telephone Encounter (Signed)
-----   Message from Melrose Nakayama, MD sent at 04/09/2021  2:50 PM EST ----- Regarding: RE: Path results I called her  [facility] ----- Message ----- From: Donnella Sham, RN Sent: 04/09/2021  12:21 PM EST To: Melrose Nakayama, MD Subject: Path results                                   Hey,  Patient is calling requesting her path results. She isn't scheduled to see you back until 04/23/21. Please advise.  Thanks,  Caryl Pina

## 2021-04-15 ENCOUNTER — Other Ambulatory Visit: Payer: Self-pay

## 2021-04-15 ENCOUNTER — Ambulatory Visit (INDEPENDENT_AMBULATORY_CARE_PROVIDER_SITE_OTHER): Payer: Self-pay

## 2021-04-15 DIAGNOSIS — Z4802 Encounter for removal of sutures: Secondary | ICD-10-CM

## 2021-04-15 NOTE — Progress Notes (Signed)
Patient arrived for nurse visit to remove suture/staples post- procedure RATS wedge with Dr. Roxan Hockey 04/01/21.  One suture removed right lateral chest with no signs/ symptoms of infection noted.  Patient tolerated procedure well.  Patient/ family instructed to keep the incision sites clean and dry.  Patient/ family acknowledged instructions given.

## 2021-04-16 ENCOUNTER — Ambulatory Visit (INDEPENDENT_AMBULATORY_CARE_PROVIDER_SITE_OTHER): Payer: 59 | Admitting: Emergency Medicine

## 2021-04-16 ENCOUNTER — Encounter: Payer: Self-pay | Admitting: Emergency Medicine

## 2021-04-16 DIAGNOSIS — R911 Solitary pulmonary nodule: Secondary | ICD-10-CM | POA: Diagnosis not present

## 2021-04-16 DIAGNOSIS — R0602 Shortness of breath: Secondary | ICD-10-CM | POA: Diagnosis not present

## 2021-04-16 DIAGNOSIS — R0600 Dyspnea, unspecified: Secondary | ICD-10-CM | POA: Insufficient documentation

## 2021-04-16 LAB — PULMONARY FUNCTION TEST
DL/VA % pred: 118 %
DL/VA: 4.99 ml/min/mmHg/L
DLCO cor % pred: 86 %
DLCO cor: 17.53 ml/min/mmHg
DLCO unc % pred: 81 %
DLCO unc: 16.54 ml/min/mmHg
FEF 25-75 Post: 1.93 L/sec
FEF 25-75 Pre: 1.37 L/sec
FEF2575-%Change-Post: 40 %
FEF2575-%Pred-Post: 80 %
FEF2575-%Pred-Pre: 57 %
FEV1-%Change-Post: 8 %
FEV1-%Pred-Post: 67 %
FEV1-%Pred-Pre: 62 %
FEV1-Post: 1.73 L
FEV1-Pre: 1.6 L
FEV1FVC-%Change-Post: 5 %
FEV1FVC-%Pred-Pre: 98 %
FEV6-%Change-Post: 3 %
FEV6-%Pred-Post: 65 %
FEV6-%Pred-Pre: 63 %
FEV6-Post: 2.12 L
FEV6-Pre: 2.05 L
FEV6FVC-%Change-Post: 0 %
FEV6FVC-%Pred-Post: 103 %
FEV6FVC-%Pred-Pre: 102 %
FVC-%Change-Post: 2 %
FVC-%Pred-Post: 63 %
FVC-%Pred-Pre: 62 %
FVC-Post: 2.13 L
FVC-Pre: 2.07 L
Post FEV1/FVC ratio: 81 %
Post FEV6/FVC ratio: 100 %
Pre FEV1/FVC ratio: 77 %
Pre FEV6/FVC Ratio: 99 %
RV % pred: 109 %
RV: 2.15 L
TLC % pred: 84 %
TLC: 4.25 L

## 2021-04-16 MED ORDER — ALBUTEROL SULFATE HFA 108 (90 BASE) MCG/ACT IN AERS: 2.0000 | INHALATION_SPRAY | Freq: Four times a day (QID) | RESPIRATORY_TRACT | 6 refills | Status: DC | PRN

## 2021-04-16 NOTE — Assessment & Plan Note (Signed)
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  She will have serial imaging as per Dr. Leonarda Salon plans.

## 2021-04-16 NOTE — Assessment & Plan Note (Signed)
Mixed obstruction and restriction on pulmonary function testing, certainly being impacted by her recent surgery which should improve.  We will do a trial of albuterol to see if she gets benefit.

## 2021-04-16 NOTE — Patient Instructions (Signed)
We will do a trial of albuterol.  You can try to use 2 puffs if you need it for short windedness, chest discomfort.  He could also try using 2 puffs prior to exertion to see if it makes breathing easier.  Do not take any more frequently than every 4 hours. Follow with Dr. Roxan Hockey as planned Follow with Dr Lamonte Sakai in 6 months or sooner if you have any problems

## 2021-04-16 NOTE — Addendum Note (Signed)
Addended by: Gavin Potters R on: 04/16/2021 04:45 PM   Modules accepted: Orders

## 2021-04-16 NOTE — Progress Notes (Signed)
PFT done today. 

## 2021-04-16 NOTE — Progress Notes (Signed)
Subjective:    Patient ID: [name], female    DOB: [birth year], 61 y.o.   MRN: [id number]  HPI 61 year old never smoker with a history of left breast cancer (lumpectomy, chemo, XRT, anastrozole) followed by Dr. Lindi Adie.  She is referred today for abnormal CT scan of the chest and PET scan.  She underwent standard surveillance CT chest abdomen pelvis 02/28/2021 as below. She has great functional capacity. No cough. No CP.   CT chest abdomen pelvis 02/28/2021 reviewed by me, shows scattered right axillary nodes without any concerning characteristics, benign 3 mm right middle lobe nodule, some left upper lobe reticular scar from radiation, 9 x 5 x 6 mm mixed density subpleural left lower lobe nodule, probably slightly larger over time compared with prior scans back to 2019.  PET scan 03/07/2021 reviewed by me, shows no significant uptake in the 9 mm subpleural nodule   ROV 04/16/21 --pleasant 61 year old woman with a history of left breast cancer.  She underwent navigational bronchoscopy and then left lower lobectomy by Dr. Roxan Hockey for a small peripheral pulmonary nodule.  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  All of the resected nodes were negative. She is having some pain with inspiration, especially in the afternoon. She does not have any BD at home.   Pulmonary function testing performed today and reviewed by me showed evidence for mixed obstruction and restriction without a bronchodilator response.  There was some mild curve to her flow-volume loop.  Her lung volumes were normal.  Diffusion capacity normal.    Review of Systems As per HPI  Past Medical History:  Diagnosis Date   Abnormal Pap smear of cervix 2002   Dr. Eilleen Kempf    Allergy 2012   seasonal   Anxiety    Arthritis    Benign hemangioma    Dr,  Marybelle Killings    Breast cancer [facility]) 07/20/2014   Left Breast   Cervical stenosis (uterine cervix) 2002   Depression     Dizziness 1991   secondary to Meniere's disease   Hyperlipidemia 2007   Insomnia 1997   Meniere's disease 1991   Nervousness(799.21) 1987     Family History  Problem Relation Age of Onset   Cirrhosis Father    Colon polyps Mother    Colon cancer Neg Hx    Esophageal cancer Neg Hx    Rectal cancer Neg Hx    Stomach cancer Neg Hx     She had an aunt, never smoker, who was diagnosed with primary lung cancer  Social History   Socioeconomic History   Marital status: Married    Spouse name: Belenda Cruise   Number of children: 2   Years of education: Not on file   Highest education level: Not on file  Occupational History   Not on file  Tobacco Use   Smoking status: Never   Smokeless tobacco: Never  Vaping Use   Vaping Use: Never used  Substance and Sexual Activity   Alcohol use: Yes    Comment: very rare   Drug use: No   Sexual activity: Yes    Birth control/protection: Surgical    Comment: ablation  Other Topics Concern   Not on file  Social History Narrative   Not on file   Social Determinants of Health   Financial Resource Strain: Not on file  Food Insecurity: Not on file  Transportation Needs: Not on file  Physical Activity: Not on file  Stress: Not on file  Social Connections: Not on file  Intimate Partner Violence: Not on file    No inhaled exposures  Allergies  Allergen Reactions   Statins Other (See Comments)    Muscle pain   Lipitor [Atorvastatin] Other (See Comments)    myalgias   Temazepam Other (See Comments)    Pt does not remember (Restoril)     Outpatient Medications Prior to Visit  Medication Sig Dispense Refill   acetaminophen (TYLENOL) 500 MG tablet Take 1,500 mg by mouth daily as needed for moderate pain.     alendronate (FOSAMAX) 70 MG tablet TAKE 1 TABLET ONCE A WEEK. TAKE WITH A FULL GLASS OF WATER ON AN EMPTY STOMACH. 12 tablet 3   anastrozole (ARIMIDEX) 1 MG tablet Take 1 tablet (1 mg total) by mouth at bedtime.     Biotin 10000 MCG  TABS Take 10,000 mcg by mouth daily.     Calcium Carb-Cholecalciferol (CALCIUM 600 + D PO) Take 1 tablet by mouth at bedtime.     clobetasol cream (TEMOVATE) 5.18 % Apply 1 application topically 2 (two) times daily as needed (irritation).     clonazePAM (KLONOPIN) 0.5 MG tablet Take 3 tablets (1.5 mg total) by mouth See admin instructions. Take 1.5 mg in the morning, may take an additional 0.5 mg tablet 3 times daily as needed for meniere's attack     donepezil (ARICEPT) 10 MG tablet Take 1 tablet (10 mg total) by mouth at bedtime. 31 tablet 12   furosemide (LASIX) 20 MG tablet Take 1 tablet (20 mg total) by mouth daily as needed for edema.     meclizine (ANTIVERT) 25 MG tablet TAKE 1 TABLET (25 MG TOTAL) BY MOUTH 3 (THREE) TIMES DAILY AS NEEDED FOR DIZZINESS. 30 tablet 1   PARoxetine (PAXIL-CR) 25 MG 24 hr tablet Take 1 tablet (25 mg total) by mouth daily. 30 tablet 11   potassium chloride (KLOR-CON) 10 MEQ tablet Take 1 tablet (10 mEq total) by mouth daily as needed (when taking lasix).     rosuvastatin (CRESTOR) 10 MG tablet Take 1 tablet (10 mg total) by mouth daily. 30 tablet 12   triamcinolone cream (KENALOG) 0.1 % Apply 1 application topically 2 (two) times daily as needed (irritation).     Turmeric 500 MG TABS Take 1 tablet by mouth 2 (two) times daily.     vitamin B-12 (CYANOCOBALAMIN) 1000 MCG tablet Take 1,000 mcg by mouth at bedtime.     Vitamin D, Ergocalciferol, (DRISDOL) 1.25 MG (50000 UNIT) CAPS capsule TAKE 1 CAPSULE (50,000 UNITS TOTAL) BY MOUTH EVERY 7 (SEVEN) DAYS. 12 capsule 3   No facility-administered medications prior to visit.         Objective:   Physical Exam  Vitals:   04/16/21 1610  BP: 132/76  Pulse: 74  Temp: 98.7 F (37.1 C)  TempSrc: Oral  SpO2: 97%  Weight: 167 lb 6.4 oz (75.9 kg)  Height: 5\' 4"  (1.626 m)   Gen: Pleasant, well-nourished, in no distress,  normal affect  ENT: No lesions,  mouth clear,  oropharynx clear, no postnasal drip  Neck:  No JVD, no stridor  Lungs: No use of accessory muscles, no crackles or wheezing on normal respiration, no wheeze on forced expiration  Cardiovascular: RRR, heart sounds normal, no murmur or gallops, no peripheral edema  Musculoskeletal: No deformities, no cyanosis or clubbing  Neuro: alert, awake, non focal  Skin: Warm, no lesions or rash     Assessment & Plan:   Nodule of  upper lobe of right lung Doing well following surgical resection of highly differentiated adenocarcinoma.  She will have serial imaging as per Dr. Leonarda Salon plans.  Dyspnea Mixed obstruction and restriction on pulmonary function testing, certainly being impacted by her recent surgery which should improve.  We will do a trial of albuterol to see if she gets benefit.   Baltazar Apo, MD, PhD 04/16/2021, 4:41 PM [facility] [phone] or if no answer before 7:00PM call [phone] For any issues after 7:00PM please call eLink [phone]

## 2021-04-22 ENCOUNTER — Other Ambulatory Visit: Payer: Self-pay | Admitting: Thoracic Surgery (Cardiothoracic Vascular Surgery)

## 2021-04-22 DIAGNOSIS — R911 Solitary pulmonary nodule: Secondary | ICD-10-CM

## 2021-04-23 ENCOUNTER — Ambulatory Visit (INDEPENDENT_AMBULATORY_CARE_PROVIDER_SITE_OTHER): Payer: Self-pay | Admitting: Thoracic Surgery (Cardiothoracic Vascular Surgery)

## 2021-04-23 ENCOUNTER — Ambulatory Visit: Payer: 59 | Admitting: Thoracic Surgery (Cardiothoracic Vascular Surgery)

## 2021-04-23 ENCOUNTER — Encounter: Payer: Self-pay | Admitting: Thoracic Surgery (Cardiothoracic Vascular Surgery)

## 2021-04-23 ENCOUNTER — Other Ambulatory Visit: Payer: Self-pay

## 2021-04-23 ENCOUNTER — Ambulatory Visit: Payer: Self-pay | Admitting: Thoracic Surgery (Cardiothoracic Vascular Surgery)

## 2021-04-23 ENCOUNTER — Ambulatory Visit
Admission: RE | Admit: 2021-04-23 | Discharge: 2021-04-23 | Disposition: A | Discharge status: Home / Self Care | Payer: 59 | Source: Ambulatory Visit | Attending: Thoracic Surgery (Cardiothoracic Vascular Surgery) | Admitting: Thoracic Surgery (Cardiothoracic Vascular Surgery)

## 2021-04-23 VITALS — BP 150/82 | HR 72 | Resp 20 | Ht 64.0 in | Wt 167.0 lb

## 2021-04-23 DIAGNOSIS — R911 Solitary pulmonary nodule: Secondary | ICD-10-CM

## 2021-04-23 DIAGNOSIS — Z902 Acquired absence of lung [part of]: Secondary | ICD-10-CM

## 2021-04-23 NOTE — Progress Notes (Signed)
[address]       [place] [address]             [phone]     HPI: [name] returns for a scheduled postoperative follow-up visit  [name] is a 61 year old woman with a history of breast cancer, hyperlipidemia, Mnire's disease, allergies, anxiety, and depression.  She had a CT of the chest, abdomen and pelvis in December.  She was noted to have a subpleural nodule in the left lower lobe that increased in size in comparison to a scan from 2019.  There was no significant activity on PET.  She saw Dr. Lamonte Sakai and decided to proceed with surgical resection.  On 04/01/2021 Dr. Lamonte Sakai did bronchoscopy to mark the nodule and then I did a robotic wedge resection and node sampling.  Her postoperative course was unremarkable and she went home on day 2.  Since discharge she has been doing well.  She took narcotics for a day or 2 but has not taken any since then.  She is not having any significant pain.  She feels like when she tries to take a deep breath she cannot get it all the way in.  Her exercise tolerance is excellent but she is not back to her preoperative level yet.  She has been working a couple of hours a day.   Past Medical History:  Diagnosis Date   Abnormal Pap smear of cervix 2002   Dr. Eilleen Kempf    Allergy 2012   seasonal   Anxiety    Arthritis    Benign hemangioma    Dr,  Marybelle Killings    Breast cancer [facility]) 07/20/2014   Left Breast   Cervical stenosis (uterine cervix) 2002   Depression    Dizziness 1991   secondary to Meniere's disease   Hyperlipidemia 2007   Insomnia 1997   Meniere's disease 1991   Nervousness(799.21) 1987     Current Outpatient Medications  Medication Sig Dispense Refill   acetaminophen (TYLENOL) 500 MG tablet Take 1,500 mg by mouth daily as needed for moderate pain.     albuterol (VENTOLIN HFA) 108 (90 Base) MCG/ACT inhaler Inhale 2 puffs into the lungs every 6 (six) hours as needed for wheezing or shortness of  breath. 8 g 6   alendronate (FOSAMAX) 70 MG tablet TAKE 1 TABLET ONCE A WEEK. TAKE WITH A FULL GLASS OF WATER ON AN EMPTY STOMACH. 12 tablet 3   anastrozole (ARIMIDEX) 1 MG tablet Take 1 tablet (1 mg total) by mouth at bedtime.     Biotin 10000 MCG TABS Take 10,000 mcg by mouth daily.     Calcium Carb-Cholecalciferol (CALCIUM 600 + D PO) Take 1 tablet by mouth at bedtime.     clobetasol cream (TEMOVATE) 0.93 % Apply 1 application topically 2 (two) times daily as needed (irritation).     clonazePAM (KLONOPIN) 0.5 MG tablet Take 3 tablets (1.5 mg total) by mouth See admin instructions. Take 1.5 mg in the morning, may take an additional 0.5 mg tablet 3 times daily as needed for meniere's attack     donepezil (ARICEPT) 10 MG tablet Take 1 tablet (10 mg total) by mouth at bedtime. 31 tablet 12   furosemide (LASIX) 20 MG tablet Take 1 tablet (20 mg total) by mouth daily as needed for edema.     meclizine (ANTIVERT) 25 MG tablet TAKE 1 TABLET (25 MG TOTAL) BY MOUTH 3 (THREE) TIMES DAILY AS NEEDED FOR DIZZINESS. [place]  tablet 1   PARoxetine (PAXIL-CR) 25 MG 24 hr tablet Take 1 tablet (25 mg total) by mouth daily. 30 tablet 11   potassium chloride (KLOR-CON) 10 MEQ tablet Take 1 tablet (10 mEq total) by mouth daily as needed (when taking lasix).     rosuvastatin (CRESTOR) 10 MG tablet Take 1 tablet (10 mg total) by mouth daily. 30 tablet 12   triamcinolone cream (KENALOG) 0.1 % Apply 1 application topically 2 (two) times daily as needed (irritation).     Turmeric 500 MG TABS Take 1 tablet by mouth 2 (two) times daily.     vitamin B-12 (CYANOCOBALAMIN) 1000 MCG tablet Take 1,000 mcg by mouth at bedtime.     Vitamin D, Ergocalciferol, (DRISDOL) 1.25 MG (50000 UNIT) CAPS capsule TAKE 1 CAPSULE (50,000 UNITS TOTAL) BY MOUTH EVERY 7 (SEVEN) DAYS. 12 capsule 3   No current facility-administered medications for this visit.    Physical Exam BP (!) 150/82 (BP Location: Right Arm, Patient Position: Sitting)    Pulse  72    Resp 20    Ht 5\' 4"  (1.626 m)    Wt 167 lb (75.8 kg)    SpO2 96% Comment: RA   BMI 28.38 kg/m  61 year old woman in no acute distress Alert and oriented x3 with no focal deficits Lungs clear with equal breath sounds bilaterally Incisions well-healed  Diagnostic Tests: CHEST - 2 VIEW   COMPARISON:  Chest x-ray dated April 03, 2021.   FINDINGS: The heart size and mediastinal contours are within normal limits. Left lower lobe postsurgical changes with new small left loculated pleural effusion. Resolved left apical pneumothorax. The right lung is clear. Unchanged surgical clips in the left breast and axilla. No acute osseous abnormality.   IMPRESSION:   Electronically Signed   By: Titus Dubin M.D.   On: 04/23/2021 14:44 I personally reviewed the chest x-ray images.  Findings as noted above.  Impression: [name] is a 61 year old woman with a history of breast cancer, hyperlipidemia, Mnire's disease, allergies, anxiety, and depression.  She had a small nodule in the left lower lobe noted on CT in 2019.  More recently CT showed an increase in size of the nodule.  She underwent bronchoscopic marking followed by robotic wedge resection on 04/01/2021.  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  She is doing remarkably well for only being 3 weeks out from surgery.  She is not having to take any narcotics.  Her exercise tolerance is excellent even though is not back to her preoperative baseline.  It should return there eventually.  She may drive on a limited basis.  Appropriate precautions were discussed.  She can gradually increase her driving over the next few weeks.  There were no other restrictions on her activities.  She is anxious to return to work.  I think she can go ahead and do that whenever she feels up to it.  She has spoken to Dr. Lindi Adie.  He is planning to see her back in 2  months.  Plan: Follow-up with Dr. Lindi Adie as scheduled Return in 6 weeks with PA lateral chest x-ray to check on progress.  Melrose Nakayama, MD [facility] [phone]

## 2021-05-02 ENCOUNTER — Encounter: Payer: 59 | Admitting: Internal Medicine

## 2021-05-10 ENCOUNTER — Encounter: Payer: 59 | Admitting: Internal Medicine

## 2021-05-31 ENCOUNTER — Telehealth: Payer: Self-pay | Admitting: Medical Oncology

## 2021-05-31 NOTE — Telephone Encounter (Signed)
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 ? ?Follow-up II 20:  Treatment Arm B: phone call ?Outgoing call ?Spoke with patient and she confirms to be doing well. Patient confirms to be taking anastrozole as instructed.  ?No new anti-cancer medications. No AE's related to study drug, palbociclib. ?Patient had a robotic assisted thorascopy-wedge resection in the left lower lobe, 04/01/2021. Pathology report with highly differentiated adenocarcinoma. ?Most recent mammogram completed 06/18/2020. Patient confirms scheduled mammogram in May 2023. ? ?Plan: patient will be called for her 7 year follow-up in approximately one years time. Patient denies having any questions at this time. I thanked patient for her time today and continued support of study and encouraged her to contact Dr. Lindi Adie or myself with questions or concerns.  ? ?Maxwell Marion, RN, BSN, [place] ?Clinical Research ?05/31/2021 1:42 PM ? ? ?

## 2021-06-03 ENCOUNTER — Other Ambulatory Visit: Payer: Self-pay | Admitting: Thoracic Surgery (Cardiothoracic Vascular Surgery)

## 2021-06-03 DIAGNOSIS — R911 Solitary pulmonary nodule: Secondary | ICD-10-CM

## 2021-06-04 ENCOUNTER — Ambulatory Visit
Admission: RE | Admit: 2021-06-04 | Discharge: 2021-06-04 | Disposition: A | Discharge status: Home / Self Care | Payer: 59 | Source: Ambulatory Visit | Attending: Thoracic Surgery (Cardiothoracic Vascular Surgery) | Admitting: Thoracic Surgery (Cardiothoracic Vascular Surgery)

## 2021-06-04 ENCOUNTER — Ambulatory Visit (INDEPENDENT_AMBULATORY_CARE_PROVIDER_SITE_OTHER): Payer: Self-pay | Admitting: Thoracic Surgery (Cardiothoracic Vascular Surgery)

## 2021-06-04 ENCOUNTER — Encounter: Payer: Self-pay | Admitting: Thoracic Surgery (Cardiothoracic Vascular Surgery)

## 2021-06-04 ENCOUNTER — Other Ambulatory Visit: Payer: Self-pay

## 2021-06-04 VITALS — BP 150/82 | HR 78 | Resp 20 | Ht 64.0 in | Wt 166.4 lb

## 2021-06-04 DIAGNOSIS — Z902 Acquired absence of lung [part of]: Secondary | ICD-10-CM

## 2021-06-04 DIAGNOSIS — R911 Solitary pulmonary nodule: Secondary | ICD-10-CM

## 2021-06-04 NOTE — Progress Notes (Signed)
? ?   ?[address] ?      [place] [address] ?            [phone]   ? ?HPI: Ms. [name] returns for a scheduled follow-up after recent wedge resection. ? ?[name] is a 61 year old woman with a history of breast cancer who had a CT of the chest abdomen and pelvis in December 2022.  There was a increase in size of a subpleural nodule in the left lower lobe compared to a scan from 2019.  There is no significant activity on PET, although it was a very small nodule. ? ?On 04/01/2021 Dr. Lamonte Sakai did a navigational bronchoscopy to mark the nodule and then I did a robotic wedge resection and node sampling.  The nodule turned out to be a stage Ib (T2a, N0) adenocarcinoma.  She did well postoperatively and went home on day 2. ? ?Saw in the office on 04/23/2021.  She was doing well at that time. ? ?In the interim since her last visit she has been doing well.  She does have some soreness around the incisions and the left costal margin.  She is tolerating that well.  She not having any respiratory issues.  She sees Dr. Lindi Adie in a couple of weeks. ? ?Past Medical History:  ?Diagnosis Date  ? Abnormal Pap smear of cervix 2002  ? Dr. Eilleen Kempf   ? Allergy 2012  ? seasonal  ? Anxiety   ? Arthritis   ? Benign hemangioma   ? Dr,  Marybelle Killings   ? Breast cancer ([place]) 07/20/2014  ? Left Breast  ? Cervical stenosis (uterine cervix) 2002  ? Depression   ? Dizziness 1991  ? secondary to Meniere's disease  ? Hyperlipidemia 2007  ? Insomnia 1997  ? Meniere's disease 1991  ? Nervousness(799.21) 1987  ? ? ?Current Outpatient Medications  ?Medication Sig Dispense Refill  ? acetaminophen (TYLENOL) 500 MG tablet Take 1,500 mg by mouth daily as needed for moderate pain.    ? albuterol (VENTOLIN HFA) 108 (90 Base) MCG/ACT inhaler Inhale 2 puffs into the lungs every 6 (six) hours as needed for wheezing or shortness of breath. 8 g 6  ? alendronate (FOSAMAX) 70 MG tablet TAKE 1 TABLET ONCE A WEEK. TAKE WITH A FULL GLASS OF  WATER ON AN EMPTY STOMACH. 12 tablet 3  ? anastrozole (ARIMIDEX) 1 MG tablet Take 1 tablet (1 mg total) by mouth at bedtime.    ? Biotin 10000 MCG TABS Take 10,000 mcg by mouth daily.    ? Calcium Carb-Cholecalciferol (CALCIUM 600 + D PO) Take 1 tablet by mouth at bedtime.    ? clobetasol cream (TEMOVATE) 4.85 % Apply 1 application topically 2 (two) times daily as needed (irritation).    ? clonazePAM (KLONOPIN) 0.5 MG tablet Take 3 tablets (1.5 mg total) by mouth See admin instructions. Take 1.5 mg in the morning, may take an additional 0.5 mg tablet 3 times daily as needed for meniere's attack    ? donepezil (ARICEPT) 10 MG tablet Take 1 tablet (10 mg total) by mouth at bedtime. 31 tablet 12  ? furosemide (LASIX) 20 MG tablet Take 1 tablet (20 mg total) by mouth daily as needed for edema.    ? meclizine (ANTIVERT) 25 MG tablet TAKE 1 TABLET (25 MG TOTAL) BY MOUTH 3 (THREE) TIMES DAILY AS NEEDED FOR DIZZINESS. 30 tablet 1  ? PARoxetine (PAXIL-CR) 25 MG 24 hr tablet Take 1 tablet (25 mg total)  by mouth daily. 30 tablet 11  ? potassium chloride (KLOR-CON) 10 MEQ tablet Take 1 tablet (10 mEq total) by mouth daily as needed (when taking lasix).    ? rosuvastatin (CRESTOR) 10 MG tablet Take 1 tablet (10 mg total) by mouth daily. 30 tablet 12  ? triamcinolone cream (KENALOG) 0.1 % Apply 1 application topically 2 (two) times daily as needed (irritation).    ? Turmeric 500 MG TABS Take 1 tablet by mouth 2 (two) times daily.    ? vitamin B-12 (CYANOCOBALAMIN) 1000 MCG tablet Take 1,000 mcg by mouth at bedtime.    ? Vitamin D, Ergocalciferol, (DRISDOL) 1.25 MG (50000 UNIT) CAPS capsule TAKE 1 CAPSULE (50,000 UNITS TOTAL) BY MOUTH EVERY 7 (SEVEN) DAYS. 12 capsule 3  ? ?No current facility-administered medications for this visit.  ? ? ?Physical Exam ?BP (!) 150/82 (BP Location: Right Arm, Patient Position: Sitting, Cuff Size: Normal)   Pulse 78   Resp 20   Ht 5\' 4"  (1.626 m)   Wt 166 lb 6.4 oz (75.5 kg)   SpO2 95% Comment:  RA  BMI 28.59 kg/m?  ?61 year old woman in no acute distress ?Alert and oriented x3 with no focal deficits ?Lungs clear bilaterally ?Cardiac regular rate and rhythm ?Incisions well-healed ? ?Diagnostic Tests: ?I personally reviewed the chest x-ray.  Shows postoperative changes. ? ?Impression: ?[name] is a 61 year old non-smoker with a history of breast cancer.  She had a small left lower lobe lung nodule noted on CT a while back.  A more recent CT showed an increase in size of the nodule even though it was not active on PET/CT. ? ?Dr. Lamonte Sakai and I did a combined navigational bronchoscopy for marking and robotic wedge resection and lymph node sampling on 04/01/2021.  The nodule turned out to be a stage Ib adenocarcinoma.  There was involvement of the visceral pleura.  She did well postoperatively. ? ?She continues to do well.  She has some minor discomfort which is to be expected.  She is not having any respiratory issues. ? ?She has follow-up scheduled with Dr. Lindi Adie  ? ?Plan: ?Follow-up as scheduled with Dr. Lindi Adie  ?I will be happy to see her back anytime in the future if I can be of any further assistance with her care ? ?Melrose Nakayama, MD ?[facility] ?[phone] ? ? ? ?

## 2021-06-13 NOTE — Progress Notes (Signed)
? ?Patient Care Team: ?Nicholas Lose, MD as PCP - General (Hematology and Oncology) ?Nicholas Lose, MD as Consulting Physician (Hematology and Oncology) ?Coralie Keens, MD as Consulting Physician (General Surgery) ?Kyung Rudd, MD as Consulting Physician (Radiation Oncology) ?Sylvan Cheese, NP as Nurse Practitioner (Hematology and Oncology) ? ?DIAGNOSIS:  ?Encounter Diagnoses  ?Name Primary?  ? Malignant neoplasm of upper-outer quadrant of left breast in female, estrogen receptor positive ([place])   ? Malignant neoplasm of unspecified part of unspecified bronchus or lung ([place]) Yes  ? ? ?SUMMARY OF ONCOLOGIC HISTORY: ?Oncology History  ?Malignant neoplasm of upper-outer quadrant of left breast in female, estrogen receptor positive ([place])  ?07/18/2014 Mammogram  ? Distortion left breast, breast density category C; U/S 1.3 x 0.8 x 0.8 cm left breast mass at 2:00 position 4 cm from nipple, no lymph nodes ? ?  ?07/20/2014 Initial Diagnosis  ? Left breast Biopsy: Invasive lobular cancer with LCIS, Grade 1, ER 86%, PR 78%, Her 2 Neg Ratio 1.77, Ki 67: 14% ? ?  ?08/01/2014 Breast MRI  ? Breast MRI showed non-mass enhancement 3.9 cm, no lymph nodes ? ?  ?08/01/2014 Clinical Stage  ? Stage IIA: T2 N0 ? ?  ?08/08/2014 Surgery  ? Left breast lumpectomy: 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, with LCIS, medial and inferior margins positive, 3/4 lymph nodes positive ? ?  ?08/08/2014 Pathologic Stage  ? Stage IIB: T2 N1c M0 ? ?  ?08/16/2014 Surgery  ? Left breast medial margin reexcision residual ILC 0.2 cm; inferior margin residual foci less than 0.2 cm, 1/5 lymph nodes positive, 1 lymph node with isolated tumor cells (Overall 4/10) ? ?  ?09/04/2014 - 01/15/2015 Chemotherapy  ? Adjuvant chemotherapy with dose dense Adriamycin and Cytoxan ?4 followed by Abraxane weekly ?8 ( stopped early for profound neutropenia and thrombocytopenia) ? ?  ?01/29/2015 - 03/20/2015 Radiation Therapy  ? Adjuvant XRT [facility]): 50.4 Gy over 28  fractions; seroma boost: 10 Gy over 5 fractions. Total dose: 60.4 Gy ? ?  ?04/11/2015 -  Anti-estrogen oral therapy  ? Anastrozole 1 mg daily (PALLAS clinical trial 05/24/2015 patient was randomized to hormone therapy alone) ? ?  ?05/24/2015 Survivorship  ? Survivorship visit completed and copy of care plan given to patient ? ?  ?11/26/2016 PET scan  ? Small Left sided level 2 LN with hypermetabolism, post surg changes in breast and small right groin lymph node likely infectious/inflammatory, gallstones ? ?  ? ? ?CHIEF COMPLIANT: Follow-up of left breast cancer on anastrozole ? ?INTERVAL HISTORY: [name] is a  61 y.o. with above-mentioned history of left breast cancer treated with lumpectomy, adjuvant chemotherapy, and radiation who is currently on oral antiestrogen therapy with anastrozole. She presents to the clinic today for follow-up. She states that she was doing great. Denies any symptoms. She states that her leg is doing better. Joints pain are normal but at night its hard to walk on her feet. ? ? ?ALLERGIES:  is allergic to statins, lipitor [atorvastatin], and temazepam. ? ?MEDICATIONS:  ?Current Outpatient Medications  ?Medication Sig Dispense Refill  ? acetaminophen (TYLENOL) 500 MG tablet Take 1,500 mg by mouth daily as needed for moderate pain.    ? albuterol (VENTOLIN HFA) 108 (90 Base) MCG/ACT inhaler Inhale 2 puffs into the lungs every 6 (six) hours as needed for wheezing or shortness of breath. 8 g 6  ? alendronate (FOSAMAX) 70 MG tablet TAKE 1 TABLET ONCE A WEEK. TAKE WITH A FULL GLASS OF WATER ON  AN EMPTY STOMACH. 12 tablet 3  ? anastrozole (ARIMIDEX) 1 MG tablet Take 1 tablet (1 mg total) by mouth at bedtime.    ? Biotin 10000 MCG TABS Take 10,000 mcg by mouth daily.    ? Calcium Carb-Cholecalciferol (CALCIUM 600 + D PO) Take 1 tablet by mouth at bedtime.    ? clobetasol cream (TEMOVATE) 0.93 % Apply 1 application topically 2 (two) times daily as needed (irritation).    ? clonazePAM  (KLONOPIN) 0.5 MG tablet Take 3 tablets (1.5 mg total) by mouth See admin instructions. Take 1.5 mg in the morning, may take an additional 0.5 mg tablet 3 times daily as needed for meniere's attack    ? donepezil (ARICEPT) 10 MG tablet Take 1 tablet (10 mg total) by mouth at bedtime. 31 tablet 12  ? furosemide (LASIX) 20 MG tablet Take 1 tablet (20 mg total) by mouth daily as needed for edema.    ? meclizine (ANTIVERT) 25 MG tablet TAKE 1 TABLET (25 MG TOTAL) BY MOUTH 3 (THREE) TIMES DAILY AS NEEDED FOR DIZZINESS. 30 tablet 1  ? PARoxetine (PAXIL-CR) 25 MG 24 hr tablet Take 1 tablet (25 mg total) by mouth daily. 30 tablet 11  ? potassium chloride (KLOR-CON) 10 MEQ tablet Take 1 tablet (10 mEq total) by mouth daily as needed (when taking lasix). 90 tablet 3  ? rosuvastatin (CRESTOR) 10 MG tablet Take 1 tablet (10 mg total) by mouth daily. 30 tablet 12  ? triamcinolone cream (KENALOG) 0.1 % Apply 1 application topically 2 (two) times daily as needed (irritation).    ? Turmeric 500 MG TABS Take 1 tablet by mouth 2 (two) times daily.    ? vitamin B-12 (CYANOCOBALAMIN) 1000 MCG tablet Take 1,000 mcg by mouth at bedtime.    ? Vitamin D, Ergocalciferol, (DRISDOL) 1.25 MG (50000 UNIT) CAPS capsule TAKE 1 CAPSULE (50,000 UNITS TOTAL) BY MOUTH EVERY 7 (SEVEN) DAYS. 12 capsule 3  ? ?No current facility-administered medications for this visit.  ? ? ?PHYSICAL EXAMINATION: ?ECOG PERFORMANCE STATUS: 1 - Symptomatic but completely ambulatory ? ?Vitals:  ? 06/26/21 0806  ?BP: 138/87  ?Pulse: 71  ?Resp: 18  ?Temp: 97.7 ?F (36.5 ?C)  ?SpO2: 99%  ? ?Filed Weights  ? 06/26/21 0806  ?Weight: 165 lb 8 oz (75.1 kg)  ? ? ?BREAST: No palpable masses or nodules in either right or left breasts. No palpable axillary supraclavicular or infraclavicular adenopathy no breast tenderness or nipple discharge. (exam performed in the presence of a chaperone) ? ?LABORATORY DATA:  ?I have reviewed the data as listed ? ?  Latest Ref Rng & Units 06/26/2021   ?  7:53 AM 04/03/2021  ?  1:13 AM 04/02/2021  ?  1:15 AM  ?CMP  ?Glucose 70 - 99 mg/dL 109   113   159    ?BUN 6 - 20 mg/dL 11   13   7     ?Creatinine 0.44 - 1.00 mg/dL 0.62   0.65   0.87    ?Sodium 135 - 145 mmol/L 142   139   136    ?Potassium 3.5 - 5.1 mmol/L 3.7   4.2   3.3    ?Chloride 98 - 111 mmol/L 106   106   103    ?CO2 22 - 32 mmol/L 27   26   23     ?Calcium 8.9 - 10.3 mg/dL 9.0   8.5   8.4    ?Total Protein 6.5 - 8.1 g/dL 6.8  5.6     ?Total Bilirubin 0.3 - 1.2 mg/dL 0.7   0.6     ?Alkaline Phos 38 - 126 U/L 65   38     ?AST 15 - 41 U/L 20   23     ?ALT 0 - 44 U/L 21   26     ? ? ?Lab Results  ?Component Value Date  ? WBC 4.6 06/26/2021  ? HGB 13.8 06/26/2021  ? HCT 41.1 06/26/2021  ? MCV 92.4 06/26/2021  ? PLT 208 06/26/2021  ? NEUTROABS 2.4 06/26/2021  ? ? ?ASSESSMENT & PLAN:  ?Malignant neoplasm of upper-outer quadrant of left breast in female, estrogen receptor positive ([place]) ?Left breast lumpectomy 08/08/2014: 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, with LCIS, medial and inferior margins positive, 3/4 lymph nodes positive T2 N1 cM0 stage IIB, ER 86%, PR 78%, HER-2/neu negative ratio 1.77, Ki-67 14% ?Left breast medial margin reexcision 08/18/14:  residual ILC 0.2 cm; inferior margin residual foci less than 0.2 cm, 1/5 lymph nodes positive, 1 lymph node with isolated tumor cells (Overall 4/10) ?  ?Treatment Summary: ?1. Adjuvant chemotherapy with dose dense Adriamycin and Cytoxan x 4 followed by Abraxane weekly ?12 because of lymph node positive disease. Started 09/04/14 to 01/15/15 ?2. adjuvant radiation completed 03/20/15 ?3. Followed by antiestrogen therapy started 04/03/15 ?-------------------------------------------------------------------------------------------------------------------------- ?Anastrozole toxicities: Patient denies any hot flashes. ?Weight gain ?Arthralgias and myalgias  ?  ?PALLAS Trial : Patient has been randomized to anastrozole alone. ?ABC clinical trial: The trial randomizes  between aspirin versus placebo. She went on trial 07/26/2015 Currently on 100 mg of dosage.  No adverse effects from participating in the trial. ?  ?Adverse effects: ?1. Bruising/ Hematoma:  Currently taking 100 m

## 2021-06-24 ENCOUNTER — Other Ambulatory Visit: Payer: Self-pay

## 2021-06-24 ENCOUNTER — Other Ambulatory Visit: Payer: Self-pay | Admitting: *Deleted

## 2021-06-24 ENCOUNTER — Encounter: Payer: Self-pay | Admitting: Hematology and Oncology

## 2021-06-24 DIAGNOSIS — C50412 Malignant neoplasm of upper-outer quadrant of left female breast: Secondary | ICD-10-CM

## 2021-06-24 DIAGNOSIS — Z170 Estrogen receptor positive status [ER+]: Secondary | ICD-10-CM

## 2021-06-26 ENCOUNTER — Inpatient Hospital Stay: Payer: 59 | Attending: Hematology and Oncology

## 2021-06-26 ENCOUNTER — Inpatient Hospital Stay (HOSPITAL_BASED_OUTPATIENT_CLINIC_OR_DEPARTMENT_OTHER): Payer: 59 | Admitting: Hematology and Oncology

## 2021-06-26 VITALS — BP 138/87 | HR 71 | Temp 97.7°F | Resp 18 | Ht 64.0 in | Wt 165.5 lb

## 2021-06-26 DIAGNOSIS — Z170 Estrogen receptor positive status [ER+]: Secondary | ICD-10-CM | POA: Insufficient documentation

## 2021-06-26 DIAGNOSIS — M791 Myalgia, unspecified site: Secondary | ICD-10-CM | POA: Diagnosis not present

## 2021-06-26 DIAGNOSIS — Z888 Allergy status to other drugs, medicaments and biological substances status: Secondary | ICD-10-CM | POA: Insufficient documentation

## 2021-06-26 DIAGNOSIS — T451X5A Adverse effect of antineoplastic and immunosuppressive drugs, initial encounter: Secondary | ICD-10-CM | POA: Diagnosis not present

## 2021-06-26 DIAGNOSIS — M2550 Pain in unspecified joint: Secondary | ICD-10-CM | POA: Diagnosis not present

## 2021-06-26 DIAGNOSIS — M810 Age-related osteoporosis without current pathological fracture: Secondary | ICD-10-CM | POA: Insufficient documentation

## 2021-06-26 DIAGNOSIS — C3490 Malignant neoplasm of unspecified part of unspecified bronchus or lung: Secondary | ICD-10-CM | POA: Diagnosis not present

## 2021-06-26 DIAGNOSIS — Z79899 Other long term (current) drug therapy: Secondary | ICD-10-CM | POA: Insufficient documentation

## 2021-06-26 DIAGNOSIS — C50412 Malignant neoplasm of upper-outer quadrant of left female breast: Secondary | ICD-10-CM | POA: Insufficient documentation

## 2021-06-26 DIAGNOSIS — Z79811 Long term (current) use of aromatase inhibitors: Secondary | ICD-10-CM | POA: Insufficient documentation

## 2021-06-26 DIAGNOSIS — C3432 Malignant neoplasm of lower lobe, left bronchus or lung: Secondary | ICD-10-CM | POA: Diagnosis not present

## 2021-06-26 DIAGNOSIS — T148XXA Other injury of unspecified body region, initial encounter: Secondary | ICD-10-CM | POA: Diagnosis not present

## 2021-06-26 DIAGNOSIS — R635 Abnormal weight gain: Secondary | ICD-10-CM | POA: Insufficient documentation

## 2021-06-26 LAB — CBC WITH DIFFERENTIAL (CANCER CENTER ONLY)
Abs Immature Granulocytes: 0.01 10*3/uL (ref 0.00–0.07)
Basophils Absolute: 0.1 10*3/uL (ref 0.0–0.1)
Basophils Relative: 1 %
Eosinophils Absolute: 0.1 10*3/uL (ref 0.0–0.5)
Eosinophils Relative: 2 %
HCT: 41.1 % (ref 36.0–46.0)
Hemoglobin: 13.8 g/dL (ref 12.0–15.0)
Immature Granulocytes: 0 %
Lymphocytes Relative: 36 %
Lymphs Abs: 1.7 10*3/uL (ref 0.7–4.0)
MCH: 31 pg (ref 26.0–34.0)
MCHC: 33.6 g/dL (ref 30.0–36.0)
MCV: 92.4 fL (ref 80.0–100.0)
Monocytes Absolute: 0.4 10*3/uL (ref 0.1–1.0)
Monocytes Relative: 9 %
Neutro Abs: 2.4 10*3/uL (ref 1.7–7.7)
Neutrophils Relative %: 52 %
Platelet Count: 208 10*3/uL (ref 150–400)
RBC: 4.45 MIL/uL (ref 3.87–5.11)
RDW: 12.3 % (ref 11.5–15.5)
WBC Count: 4.6 10*3/uL (ref 4.0–10.5)
nRBC: 0 % (ref 0.0–0.2)

## 2021-06-26 LAB — CMP (CANCER CENTER ONLY)
ALT: 21 U/L (ref 0–44)
AST: 20 U/L (ref 15–41)
Albumin: 4.4 g/dL (ref 3.5–5.0)
Alkaline Phosphatase: 65 U/L (ref 38–126)
Anion gap: 9 (ref 5–15)
BUN: 11 mg/dL (ref 6–20)
CO2: 27 mmol/L (ref 22–32)
Calcium: 9 mg/dL (ref 8.9–10.3)
Chloride: 106 mmol/L (ref 98–111)
Creatinine: 0.62 mg/dL (ref 0.44–1.00)
GFR, Estimated: >60 mL/min (ref >60)
Glucose, Bld: 109 mg/dL — ABNORMAL HIGH (ref 70–99)
Potassium: 3.7 mmol/L (ref 3.5–5.1)
Sodium: 142 mmol/L (ref 135–145)
Total Bilirubin: 0.7 mg/dL (ref 0.3–1.2)
Total Protein: 6.8 g/dL (ref 6.5–8.1)

## 2021-06-26 LAB — LIPID PANEL
Cholesterol: 145 mg/dL (ref 0–200)
HDL: 56 mg/dL (ref >40)
LDL Cholesterol: 70 mg/dL (ref 0–99)
Total CHOL/HDL Ratio: 2.6 RATIO
Triglycerides: 97 mg/dL (ref <150)
VLDL: 19 mg/dL (ref 0–40)

## 2021-06-26 LAB — HEMOGLOBIN A1C
Hgb A1c MFr Bld: 5.4 % (ref 4.8–5.6)
Mean Plasma Glucose: 108.28 mg/dL

## 2021-06-26 MED ORDER — POTASSIUM CHLORIDE ER 10 MEQ PO TBCR: 10.0000 meq | EXTENDED_RELEASE_TABLET | Freq: Every day | ORAL | 3 refills | Status: AC | PRN

## 2021-06-26 MED ORDER — FUROSEMIDE 20 MG PO TABS
20.0000 mg | ORAL_TABLET | Freq: Every day | ORAL | Status: DC | PRN
Start: 2021-06-26 — End: 2021-08-12

## 2021-06-26 NOTE — Assessment & Plan Note (Addendum)
Left breast lumpectomy 08/08/2014: 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, with LCIS, medial and inferior margins positive, 3/4 lymph nodes positive T2 N1 cM0 stage IIB, ER 86%, PR 78%, HER-2/neu negative ratio 1.77, Ki-67 14% ?Left breast medial margin reexcision 08/18/14:? residual ILC 0.2 cm; inferior margin residual foci less than 0.2 cm, 1/5 lymph nodes positive, 1 lymph node with isolated tumor cells (Overall 4/10) ?? ?Treatment Summary: ?1. Adjuvant chemotherapy with dose dense Adriamycin and Cytoxan x 4 followed by Abraxane weekly ?12 because of lymph node positive disease. Started 09/04/14 to 01/15/15 ?2. adjuvant radiation completed 03/20/15 ?3. Followed by antiestrogen therapy started 04/03/15 ?-------------------------------------------------------------------------------------------------------------------------- ?Anastrozole toxicities: Patient denies any hot flashes. ?Weight gain ?Arthralgias and myalgias? ?? ?PALLAS Trial : Patient has been randomized to anastrozole alone. ?ABC clinical trial: The trial randomizes between aspirin versus placebo. She went on trial 07/26/2015 Currently on 100 mg of dosage.??No adverse effects from participating in the trial. ?? ?Adverse effects: ?1. Bruising/ Hematoma: ?Currently taking?100 mg of aspirin/placebo:?Mild ?2. Osteoporosis: Fosamax weekly, DEXA scan 07/28/2019: T score -2.1 (improved from -2.5) ?? ?Cognitive dysfunction: On?Aricept 10 mg daily, followed by PCP, doing much better ?Profound lymphedema: Diffuse.  Response to Lasix I renewed her prescription for Lasix and potassium.  We will refer her back to physical therapy for SOZO and evaluation for lymphedema garment. ?? ?Breast cancer surveillance:? ?1. Mammogram?07/31/2020?benign ?2. Breast exam?06/20/20: No palpable lumps or nodules of concern.? ?3.?CT CAP 10/28/2017: No evidence of malignancy, radiation changes anterior left upper lobe? ?? ?Bone density 07/26/2015: T score -1.9, osteopenia ?Bone density:  07/28/19 T score -2.1?currently on Fosamax ?? ?No clinical evidence of breast cancer recurrence. ? ?03/08/2021: PET CT scan: 9 mm posterior left lower lobe nodule ?04/01/2021: Left lower lobe wedge resection (Dr. 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 ?We will repeat a CT chest in the next 1 to 2 weeks. ? ? ?Return to clinic in 6 months for follow-up ?

## 2021-07-16 ENCOUNTER — Encounter: Payer: 59 | Admitting: Physical Therapy

## 2021-07-23 ENCOUNTER — Ambulatory Visit (HOSPITAL_COMMUNITY)
Admission: RE | Admit: 2021-07-23 | Discharge: 2021-07-23 | Disposition: A | Discharge status: Home / Self Care | Payer: 59 | Source: Ambulatory Visit | Attending: Hematology and Oncology | Admitting: Hematology and Oncology

## 2021-07-23 DIAGNOSIS — C3490 Malignant neoplasm of unspecified part of unspecified bronchus or lung: Secondary | ICD-10-CM | POA: Insufficient documentation

## 2021-07-23 MED ORDER — IOHEXOL 300 MG/ML  SOLN
100.0000 mL | Freq: Once | INTRAMUSCULAR | Status: AC | PRN
  Administered 2021-07-23: 75 mL via INTRAVENOUS

## 2021-08-06 ENCOUNTER — Encounter: Payer: Self-pay | Admitting: Hematology and Oncology

## 2021-08-12 ENCOUNTER — Other Ambulatory Visit: Payer: Self-pay | Admitting: Hematology and Oncology

## 2021-08-22 ENCOUNTER — Encounter: Payer: Self-pay | Admitting: Internal Medicine

## 2021-08-23 ENCOUNTER — Ambulatory Visit: Payer: PRIVATE HEALTH INSURANCE | Admitting: Family Medicine

## 2021-09-03 ENCOUNTER — Encounter: Payer: Self-pay | Admitting: Family Medicine

## 2021-09-03 ENCOUNTER — Ambulatory Visit (INDEPENDENT_AMBULATORY_CARE_PROVIDER_SITE_OTHER): Payer: PRIVATE HEALTH INSURANCE | Admitting: Family Medicine

## 2021-09-03 VITALS — BP 136/84 | HR 85 | Temp 98.5°F | Ht 64.0 in | Wt 164.5 lb

## 2021-09-03 DIAGNOSIS — Z79899 Other long term (current) drug therapy: Secondary | ICD-10-CM | POA: Diagnosis not present

## 2021-09-03 DIAGNOSIS — Z23 Encounter for immunization: Secondary | ICD-10-CM | POA: Diagnosis not present

## 2021-09-03 DIAGNOSIS — M818 Other osteoporosis without current pathological fracture: Secondary | ICD-10-CM

## 2021-09-03 DIAGNOSIS — H8101 Meniere's disease, right ear: Secondary | ICD-10-CM

## 2021-09-03 DIAGNOSIS — E559 Vitamin D deficiency, unspecified: Secondary | ICD-10-CM

## 2021-09-03 MED ORDER — CLONAZEPAM 0.5 MG PO TABS: ORAL_TABLET | ORAL | 5 refills | Status: DC

## 2021-09-03 MED ORDER — ROSUVASTATIN CALCIUM 10 MG PO TABS: 10.0000 mg | ORAL_TABLET | Freq: Every day | ORAL | 12 refills | Status: DC

## 2021-09-03 MED ORDER — DONEPEZIL HCL 10 MG PO TABS: 10.0000 mg | ORAL_TABLET | Freq: Every day | ORAL | 12 refills | Status: DC

## 2021-09-03 MED ORDER — ALENDRONATE SODIUM 70 MG PO TABS: ORAL_TABLET | ORAL | 3 refills | Status: DC

## 2021-09-03 MED ORDER — PAROXETINE HCL ER 25 MG PO TB24: 25.0000 mg | ORAL_TABLET | Freq: Every day | ORAL | 11 refills | Status: DC

## 2021-09-03 MED ORDER — VITAMIN D (ERGOCALCIFEROL) 1.25 MG (50000 UNIT) PO CAPS: ORAL_CAPSULE | ORAL | 3 refills | Status: DC

## 2021-09-06 LAB — TOXASSURE SELECT 13 (MW), URINE

## 2021-09-14 ENCOUNTER — Other Ambulatory Visit: Payer: Self-pay | Admitting: Family Medicine

## 2021-09-18 ENCOUNTER — Encounter: Payer: Self-pay | Admitting: Internal Medicine

## 2021-09-25 ENCOUNTER — Other Ambulatory Visit: Payer: Self-pay | Admitting: Hematology and Oncology

## 2021-09-25 MED ORDER — DOXYCYCLINE HYCLATE 100 MG PO TABS: 100.0000 mg | ORAL_TABLET | Freq: Two times a day (BID) | ORAL | 0 refills | Status: DC

## 2021-09-25 NOTE — Progress Notes (Signed)
Bee Bite: With redness and tenderness: I sent a prescription for doxycycline 100 mg p.o. twice daily for 1 week.

## 2021-10-08 ENCOUNTER — Ambulatory Visit (AMBULATORY_SURGERY_CENTER): Payer: 59 | Admitting: *Deleted

## 2021-10-08 VITALS — Ht 64.0 in | Wt 164.0 lb

## 2021-10-08 DIAGNOSIS — Z86010 Personal history of colonic polyps: Secondary | ICD-10-CM

## 2021-10-08 MED ORDER — NA SULFATE-K SULFATE-MG SULF 17.5-3.13-1.6 GM/177ML PO SOLN: 1.0000 | ORAL | 0 refills | Status: DC

## 2021-10-08 NOTE — Progress Notes (Signed)
Patient's pre-visit was done today over the phone with the patient. Name,DOB and address verified. Patient denies any allergies to Eggs and Soy. Patient denies any problems with anesthesia/sedation. Patient is not taking any diet pills or blood thinners. No home Oxygen. Insurance confirmed with patient. Patient states she is doing great from having last lung surgery.  Went over prep instructions with patient. Prep instructions sent to pt's MyChart & mailed to pt-pt is aware. Patient understands to call us back with any questions or concerns. Patient is aware of our care-partner policy. Patient encouraged to use Singlecare or Good-Rx for prep prescription.

## 2021-10-29 ENCOUNTER — Encounter: Payer: Self-pay | Admitting: Internal Medicine

## 2021-11-05 ENCOUNTER — Ambulatory Visit (AMBULATORY_SURGERY_CENTER): Payer: 59 | Admitting: Internal Medicine

## 2021-11-05 ENCOUNTER — Encounter: Payer: Self-pay | Admitting: Internal Medicine

## 2021-11-05 VITALS — BP 93/72 | HR 71 | Temp 98.0°F | Resp 14 | Ht 64.0 in | Wt 164.0 lb

## 2021-11-05 DIAGNOSIS — Z86010 Personal history of colonic polyps: Secondary | ICD-10-CM

## 2021-11-05 DIAGNOSIS — Z09 Encounter for follow-up examination after completed treatment for conditions other than malignant neoplasm: Secondary | ICD-10-CM

## 2021-11-05 MED ORDER — SODIUM CHLORIDE 0.9 % IV SOLN: 500.0000 mL | Freq: Once | INTRAVENOUS | Status: DC

## 2021-11-05 NOTE — Progress Notes (Signed)
Sedate, gd SR, tolerated procedure well, VSS, report to RN 

## 2021-11-05 NOTE — Op Note (Addendum)
[place] Patient Name: [name] Procedure Date: 11/05/2021 7:42 AM MRN: [id number] Endoscopist: Docia Chuck. Henrene Pastor , MD Age: 61 Referring MD:  Date of Birth: 10-09-1960 Gender: Female Account #: [id number] Procedure:                Colonoscopy Indications:              High risk colon cancer surveillance: Personal                            history of multiple (3 or more) adenomas. Previous                            examinations 2014, 2019 Medicines:                Monitored Anesthesia Care Procedure:                Pre-Anesthesia Assessment:                           - Prior to the procedure, a History and Physical                            was performed, and patient medications and                            allergies were reviewed. The patient's tolerance of                            previous anesthesia was also reviewed. The risks                            and benefits of the procedure and the sedation                            options and risks were discussed with the patient.                            All questions were answered, and informed consent                            was obtained. Prior Anticoagulants: The patient has                            taken no previous anticoagulant or antiplatelet                            agents. ASA Grade Assessment: II - A patient with                            mild systemic disease. After reviewing the risks                            and benefits, the patient was deemed in  satisfactory condition to undergo the procedure.                           After obtaining informed consent, the colonoscope                            was passed under direct vision. Throughout the                            procedure, the patient's blood pressure, pulse, and                            oxygen saturations were monitored continuously. The                            CF [id number] #[id number] was introduced  through the anus                            and advanced to the the cecum, identified by                            appendiceal orifice and ileocecal valve. The                            ileocecal valve, appendiceal orifice, and rectum                            were photographed. The quality of the bowel                            preparation was excellent. The colonoscopy was                            performed without difficulty. The patient tolerated                            the procedure well. The bowel preparation used was                            SUPREP via split dose instruction. Scope In: 8:18:28 AM Scope Out: 8:33:17 AM Scope Withdrawal Time: 0 hours 9 minutes 49 seconds  Total Procedure Duration: 0 hours 14 minutes 49 seconds  Findings:                 The entire examined colon appeared normal on direct                            and retroflexion views. Complications:            No immediate complications. Estimated blood loss:                            None. Estimated Blood Loss:     Estimated blood loss: none. Impression:               - The  entire examined colon is normal on direct and                            retroflexion views.                           - No specimens collected. Recommendation:           - Repeat colonoscopy in 5 years for surveillance                            (personal history of multiple adenomas).                           - Patient has a contact number available for                            emergencies. The signs and symptoms of potential                            delayed complications were discussed with the                            patient. Return to normal activities tomorrow.                            Written discharge instructions were provided to the                            patient.                           - Resume previous diet.                           - Continue present medications. Docia Chuck. Henrene Pastor, MD 11/05/2021  8:41:41 AM This report has been signed electronically.

## 2021-11-05 NOTE — Progress Notes (Signed)
Pt's states no medical or surgical changes since previsit or office visit. 

## 2021-11-05 NOTE — Patient Instructions (Addendum)
YOU HAD AN ENDOSCOPIC PROCEDURE TODAY AT [facility]:   Refer to the procedure report that was given to you for any specific questions about what was found during the examination.  If the procedure report does not answer your questions, please call your gastroenterologist to clarify.  If you requested that your care partner not be given the details of your procedure findings, then the procedure report has been included in a sealed envelope for you to review at your convenience later.  YOU SHOULD EXPECT: Some feelings of bloating in the abdomen. Passage of more gas than usual.  Walking can help get rid of the air that was put into your GI tract during the procedure and reduce the bloating. If you had a lower endoscopy (such as a colonoscopy or flexible sigmoidoscopy) you may notice spotting of blood in your stool or on the toilet paper. If you underwent a bowel prep for your procedure, you may not have a normal bowel movement for a few days.  Please Note:  You might notice some irritation and congestion in your nose or some drainage.  This is from the oxygen used during your procedure.  There is no need for concern and it should clear up in a day or so.  SYMPTOMS TO REPORT IMMEDIATELY:  Following lower endoscopy (colonoscopy or flexible sigmoidoscopy):  Excessive amounts of blood in the stool  Significant tenderness or worsening of abdominal pains  Swelling of the abdomen that is new, acute  Fever of 100F or higher   For urgent or emergent issues, a gastroenterologist can be reached at any hour by calling [phone]. Do not use MyChart messaging for urgent concerns.    DIET:  We do recommend a small meal at first, but then you may proceed to your regular diet.  Drink plenty of fluids but you should avoid alcoholic beverages for 24 hours.  ACTIVITY:  You should plan to take it easy for the rest of today and you should NOT DRIVE or use heavy machinery until tomorrow (because  of the sedation medicines used during the test).    FOLLOW UP: Our staff will call the number listed on your records the next business day following your procedure.  We will call around 7:15- 8:00 am to check on you and address any questions or concerns that you may have regarding the information given to you following your procedure. If we do not reach you, we will leave a message.  If you develop any symptoms (ie: fever, flu-like symptoms, shortness of breath, cough etc.) before then, please call [phone].  If you test positive for Covid 19 in the 2 weeks post procedure, please call and report this information to [place].    If any biopsies were taken you will be contacted by phone or by letter within the next 1-3 weeks.  Please call us at [phone] if you have not heard about the biopsies in 3 weeks.    SIGNATURES/CONFIDENTIALITY: You and/or your care partner have signed paperwork which will be entered into your electronic medical record.  These signatures attest to the fact that that the information above on your After Visit Summary has been reviewed and is understood.  Full responsibility of the confidentiality of this discharge information lies with you and/or your care-partner.

## 2021-11-05 NOTE — Progress Notes (Signed)
HISTORY OF PRESENT ILLNESS:  [name] is a 61 y.o. female with a history of multiple adenomatous colon polyps who presents today for surveillance colonoscopies.  No complaints  REVIEW OF SYSTEMS:  All non-GI ROS negative. Past Medical History:  Diagnosis Date   Abnormal Pap smear of cervix 2002   Dr. Eilleen Kempf    Allergy 2012   seasonal   Anxiety    Arthritis    Benign hemangioma    Dr,  Marybelle Killings    Breast cancer [facility]) 07/20/2014   Left Breast   Cervical stenosis (uterine cervix) 2002   Depression    Dizziness 1991   secondary to Meniere's disease   Hyperlipidemia 2007   Insomnia 1997   Meniere's disease 1991   Nervousness(799.21) 1987    Past Surgical History:  Procedure Laterality Date   AXILLARY LYMPH NODE DISSECTION Left 08/16/2014   Procedure: AXILLARY LYMPH NODE DISSECTION;  Surgeon: Coralie Keens, MD;  Location: [place];  Service: General;  Laterality: Left;   bilateral  tubal ligation  05/08/2004   BREAST LUMPECTOMY WITH RADIOACTIVE SEED AND SENTINEL LYMPH NODE BIOPSY Left 08/08/2014   Procedure: BREAST LUMPECTOMY WITH RADIOACTIVE SEED AND SENTINEL LYMPH NODE BIOPSY;  Surgeon: Coralie Keens, MD;  Location: [place];  Service: General;  Laterality: Left;   BREAST REDUCTION SURGERY Right    CHOLECYSTECTOMY  12/30/2016   COLONOSCOPY  02/18/2018   Dr.Kristjan Derner   FIDUCIAL MARKER PLACEMENT  04/01/2021   Procedure: fiducial dye marking;  Surgeon: Collene Gobble, MD;  Location: [place] [facility];  Service: Pulmonary;;   INTERCOSTAL NERVE BLOCK Left 04/01/2021   Procedure: INTERCOSTAL NERVE BLOCK;  Surgeon: Melrose Nakayama, MD;  Location: [place];  Service: Thoracic;  Laterality: Left;   LYMPH NODE DISSECTION N/A 04/01/2021   Procedure: LYMPH NODE DISSECTION;  Surgeon: Melrose Nakayama, MD;  Location: [facility];  Service: Thoracic;  Laterality: N/A;   MASS EXCISION Left 01/08/2018   Procedure: EXCISION MUCOID CYST,  DEBRIDEMENT DISTAL INTERPHALANGEAL LEFT MIDDLE FINGER;  Surgeon: Daryll Brod, MD;  Location: [place];  Service: Orthopedics;  Laterality: Left;   MASTOPEXY Right 12/14/2015   Procedure: RIGHT BREAST MASTOPEXY;  Surgeon: Irene Limbo, MD;  Location: [place];  Service: Plastics;  Laterality: Right;   PORT-A-CATH REMOVAL N/A 03/08/2015   Procedure: REMOVAL PORT-A-CATH;  Surgeon: Coralie Keens, MD;  Location: [facility];  Service: General;  Laterality: N/A;   PORTACATH PLACEMENT Right 08/16/2014   Procedure: INSERTION PORT-A-CATH;  Surgeon: Coralie Keens, MD;  Location: [place];  Service: General;  Laterality: Right;   RE-EXCISION OF BREAST LUMPECTOMY Left 08/16/2014   Procedure: RE-EXCISION OF LEFT BREAST CANCER AND PORT PLACEMENT/POSSIBLE AXILLARY DISECTION;  Surgeon: Coralie Keens, MD;  Location: [place];  Service: General;  Laterality: Left;   right breast surg     [place] Right 07/28/2012   Procedure: RIGHT SHOULDER DECOMPRESSION AND EXCISION OF CALCIFIC DEPOSIT;  Surgeon: Tobi Bastos, MD;  Location: [facility];  Service: Orthopedics;  Laterality: Right;   TUBAL LIGATION     uterine ablation  02/2006    Due to heavy periods Dr. Jon Billings    VIDEO BRONCHOSCOPY WITH RADIAL ENDOBRONCHIAL ULTRASOUND  04/01/2021   Procedure: VIDEO BRONCHOSCOPY WITH RADIAL ENDOBRONCHIAL ULTRASOUND;  Surgeon: Collene Gobble, MD;  Location[facility];  Service: Pulmonary;;   wisdom teeth extractions  yrs ago    Social History  Dia Donate Kovar  reports that she has never smoked. She has never used smokeless tobacco. She reports that she does not currently use alcohol. She reports that she does not use drugs.  family history includes Cirrhosis in her father; Colon polyps in her mother.  Allergies  Allergen Reactions   Statins Other (See Comments)    Muscle pain   Lipitor [Atorvastatin]  Other (See Comments)    myalgias   Temazepam Other (See Comments)    Pt does not remember (Restoril)       PHYSICAL EXAMINATION: Vital signs: BP 107/76   Pulse 74   Temp 98 F (36.7 C)   Resp 18   Ht 5\' 4"  (1.626 m)   Wt 164 lb (74.4 kg)   SpO2 98%   BMI 28.15 kg/m  General: Well-developed, well-nourished, no acute distress HEENT: Sclerae are anicteric, conjunctiva pink. Oral mucosa intact Lungs: Clear Heart: Regular Abdomen: soft, nontender, nondistended, no obvious ascites, no peritoneal signs, normal bowel sounds. No organomegaly. Extremities: No edema Psychiatric: alert and oriented x3. Cooperative      ASSESSMENT:  Personal history of multiple adenomatous polyps   PLAN:   Surveillance colonoscopy

## 2021-11-06 ENCOUNTER — Telehealth: Payer: Self-pay

## 2021-11-06 NOTE — Telephone Encounter (Signed)
  Follow up Call-     11/05/2021    7:17 AM  Call back number  Post procedure Call Back phone  # [phone]  Permission to leave phone message Yes     Patient questions:  Do you have a fever, pain , or abdominal swelling? No. Pain Score  0 *  Have you tolerated food without any problems? Yes.    Have you been able to return to your normal activities? Yes.    Do you have any questions about your discharge instructions: Diet   No. Medications  No. Follow up visit  No.  Do you have questions or concerns about your Care? No.  Actions: * If pain score is 4 or above: No action needed, pain <4.

## 2021-12-12 ENCOUNTER — Other Ambulatory Visit: Payer: Self-pay | Admitting: Hematology and Oncology

## 2021-12-26 NOTE — Progress Notes (Signed)
Patient Care Team: [name], DO as PCP - General (Family Medicine) Nicholas Lose, MD as Consulting Physician (Hematology and Oncology) Coralie Keens, MD as Consulting Physician (General Surgery) Kyung Rudd, MD as Consulting Physician (Radiation Oncology) Sylvan Cheese, NP as Nurse Practitioner (Hematology and Oncology)  DIAGNOSIS:  Encounter Diagnoses  Name Primary?   Malignant neoplasm of upper-outer quadrant of left breast in female, estrogen receptor positive ([place])    Postmenopausal    Overweight (BMI 25.0-29.9) Yes    SUMMARY OF ONCOLOGIC HISTORY: Oncology History  Malignant neoplasm of upper-outer quadrant of left breast in female, estrogen receptor positive ([place])  07/18/2014 Mammogram   Distortion left breast, breast density category C; U/S 1.3 x 0.8 x 0.8 cm left breast mass at 2:00 position 4 cm from nipple, no lymph nodes   07/20/2014 Initial Diagnosis   Left breast Biopsy: Invasive lobular cancer with LCIS, Grade 1, ER 86%, PR 78%, Her 2 Neg Ratio 1.77, Ki 67: 14%   08/01/2014 Breast MRI   Breast MRI showed non-mass enhancement 3.9 cm, no lymph nodes   08/01/2014 Clinical Stage   Stage IIA: T2 N0   08/08/2014 Surgery   Left breast lumpectomy: 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, with LCIS, medial and inferior margins positive, 3/4 lymph nodes positive   08/08/2014 Pathologic Stage   Stage IIB: T2 N1c M0   08/16/2014 Surgery   Left breast medial margin reexcision residual ILC 0.2 cm; inferior margin residual foci less than 0.2 cm, 1/5 lymph nodes positive, 1 lymph node with isolated tumor cells (Overall 4/10)   09/04/2014 - 01/15/2015 Chemotherapy   Adjuvant chemotherapy with dose dense Adriamycin and Cytoxan 4 followed by Abraxane weekly 8 ( stopped early for profound neutropenia and thrombocytopenia)   01/29/2015 - 03/20/2015 Radiation Therapy   Adjuvant XRT [facility]): 50.4 Gy over 28 fractions; seroma boost: 10 Gy over 5 fractions.  Total dose: 60.4 Gy   04/11/2015 -  Anti-estrogen oral therapy   Anastrozole 1 mg daily (PALLAS clinical trial 05/24/2015 patient was randomized to hormone therapy alone)   05/24/2015 Survivorship   Survivorship visit completed and copy of care plan given to patient   11/26/2016 PET scan   Small Left sided level 2 LN with hypermetabolism, post surg changes in breast and small right groin lymph node likely infectious/inflammatory, gallstones     CHIEF COMPLIANT: Follow-up of left breast cancer on anastrozole  INTERVAL HISTORY: [name] is a 61 y.o. with above-mentioned history of left breast cancer treated with lumpectomy, adjuvant chemotherapy, and radiation who is currently on oral antiestrogen therapy with anastrozole. She presents to the clinic today for follow-up. She states that she has been doing fine. Her main concern is her weight. She says her feet is still numb late at night is when it mostly bothers her.   ALLERGIES:  is allergic to statins, lipitor [atorvastatin], and temazepam.  MEDICATIONS:  Current Outpatient Medications  Medication Sig Dispense Refill   acetaminophen (TYLENOL) 500 MG tablet Take 1,500 mg by mouth daily as needed for moderate pain.     albuterol (VENTOLIN HFA) 108 (90 Base) MCG/ACT inhaler Inhale 2 puffs into the lungs every 6 (six) hours as needed for wheezing or shortness of breath. 8 g 6   alendronate (FOSAMAX) 70 MG tablet TAKE 1 TABLET ONCE A WEEK. TAKE WITH A FULL GLASS OF WATER ON AN EMPTY STOMACH. 12 tablet 3   anastrozole (ARIMIDEX) 1 MG tablet Take 1 tablet (1 mg total)  by mouth at bedtime.     Biotin 10000 MCG TABS Take 10,000 mcg by mouth daily.     Calcium Carb-Cholecalciferol (CALCIUM 600 + D PO) Take 1 tablet by mouth at bedtime.     clobetasol cream (TEMOVATE) 9.75 % Apply 1 application topically 2 (two) times daily as needed (irritation).     clonazePAM (KLONOPIN) 0.5 MG tablet 1 tablet 3 times daily as needed for meniere's attack 90  tablet 5   donepezil (ARICEPT) 10 MG tablet Take 1 tablet (10 mg total) by mouth at bedtime. 31 tablet 12   furosemide (LASIX) 20 MG tablet TAKE 1 TABLET BY MOUTH EVERY DAY 30 tablet 3   meclizine (ANTIVERT) 25 MG tablet TAKE 1 TABLET (25 MG TOTAL) BY MOUTH 3 (THREE) TIMES DAILY AS NEEDED FOR DIZZINESS. 30 tablet 1   PARoxetine (PAXIL-CR) 25 MG 24 hr tablet Take 1 tablet (25 mg total) by mouth daily. 30 tablet 11   potassium chloride (KLOR-CON) 10 MEQ tablet Take 1 tablet (10 mEq total) by mouth daily as needed (when taking lasix). 90 tablet 3   rosuvastatin (CRESTOR) 10 MG tablet Take 1 tablet (10 mg total) by mouth daily. 30 tablet 12   triamcinolone cream (KENALOG) 0.1 % Apply 1 application topically 2 (two) times daily as needed (irritation).     Turmeric 500 MG TABS Take 1 tablet by mouth 2 (two) times daily.     vitamin B-12 (CYANOCOBALAMIN) 1000 MCG tablet Take 1,000 mcg by mouth at bedtime.     Vitamin D, Ergocalciferol, (DRISDOL) 1.25 MG (50000 UNIT) CAPS capsule TAKE 1 CAPSULE (50,000 UNITS TOTAL) BY MOUTH EVERY 7 (SEVEN) DAYS. 12 capsule 3   No current facility-administered medications for this visit.    PHYSICAL EXAMINATION: ECOG PERFORMANCE STATUS: 1 - Symptomatic but completely ambulatory  Vitals:   12/31/21 0813  BP: (!) 161/99  Pulse: 65  Resp: 14  Temp: 97.9 F (36.6 C)  SpO2: 99%   Filed Weights   12/31/21 0813  Weight: 166 lb (75.3 kg)    BREAST: No palpable masses or nodules in either right or left breasts. No palpable axillary supraclavicular or infraclavicular adenopathy no breast tenderness or nipple discharge. (exam performed in the presence of a chaperone)  LABORATORY DATA:  I have reviewed the data as listed    Latest Ref Rng & Units 12/31/2021    8:00 AM 06/26/2021    7:53 AM 04/03/2021    1:13 AM  CMP  Glucose 70 - 99 mg/dL 107  109  113   BUN 6 - 20 mg/dL _0 Creatinine 0.44 - 1.00 mg/dL 0.67  0.62  0.65   Sodium 135 - 145 mmol/L 140   142  139   Potassium 3.5 - 5.1 mmol/L 3.6  3.7  4.2   Chloride 98 - 111 mmol/L 106  106  106   CO2 22 - 32 mmol/L _1 Calcium 8.9 - 10.3 mg/dL 9.0  9.0  8.5   Total Protein 6.5 - 8.1 g/dL 7.0  6.8  5.6   Total Bilirubin 0.3 - 1.2 mg/dL 0.6  0.7  0.6   Alkaline Phos 38 - 126 U/L 58  65  38   AST 15 - 41 U/L _2 ALT 0 - 44 U/L _3 Lab Results  Component Value Date   WBC 5.3 12/31/2021  HGB 14.6 12/31/2021   HCT 42.5 12/31/2021   MCV 93.0 12/31/2021   PLT 205 12/31/2021   NEUTROABS 3.0 12/31/2021    ASSESSMENT & PLAN:  Malignant neoplasm of upper-outer quadrant of left breast in female, estrogen receptor positive ([place]) Left breast lumpectomy 08/08/2014: 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 Adjuvant chemotherapy with dose dense Adriamycin and Cytoxan x 4 followed by Abraxane weekly 12 because of lymph node positive disease. Started 09/04/14 to 01/15/15 2. adjuvant radiation completed 03/20/15 3. Followed by antiestrogen therapy started 04/03/15 -------------------------------------------------------------------------------------------------------------------------- Anastrozole toxicities: Patient denies any hot flashes. Weight gain Arthralgias and myalgias    PALLAS Trial : Patient has been randomized to anastrozole alone. ABC clinical trial: The trial randomizes between aspirin versus placebo. She went on trial 07/26/2015 Currently on 100 mg of dosage.  No adverse effects from participating in the trial.   Adverse effects: 1. Bruising/ Hematoma:  Currently taking 100 mg of  aspirin/placebo: Mild 2. Osteoporosis: Fosamax weekly, DEXA scan 07/28/2019: T score -2.1 (improved from -2.5)   Cognitive dysfunction: On Aricept 10 mg daily, followed by PCP, doing much better Profound lymphedema: Diffuse.  Response to Lasix I renewed her prescription for Lasix and potassium.  We will refer her back to physical therapy for SOZO and evaluation for lymphedema garment.   Breast cancer surveillance:  1. Mammogram 08/06/2021 benign 2. Breast exam 12/31/2021: No palpable lumps or nodules of concern.    Bone density 07/26/2015: T score -1.9, osteopenia Bone density: 07/28/19 T score -2.1 currently on Fosamax   No clinical evidence of breast cancer recurrence.   03/08/2021: PET CT scan: 9 mm posterior left lower lobe nodule 04/01/2021: Left lower lobe wedge resection (Dr. 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    Answer:   No    Order Specific Question:   Preferred imaging location?    Answer:   [facility]   CT CHEST ABDOMEN PELVIS W CONTRAST    Standing Status:   Future    Standing Expiration Date:   01/01/2023    Order Specific Question:   Is patient pregnant?    Answer:   No    Order Specific Question:   Preferred imaging location?    Answer:   [facility]    Order Specific Question:   Is Oral Contrast requested for this exam?    Answer:   Yes, Per Radiology protocol   Lipid panel    Standing  Status:   Future    Standing Expiration Date:  12/31/2022   Amb Ref to [facility]    Referral Priority:   Routine    Referral Type:   Consultation    Referral Reason:   Patient Preference    Number of Visits Requested:   1   The patient has a good understanding of the overall plan. she agrees with it. she will call with any problems that may develop before the next visit here. Total time spent: 30 mins including face to face time and time spent for planning, charting and co-ordination of care   Harriette Ohara, MD 12/31/21    I Gardiner Coins am scribing for Dr. Lindi Adie  I have reviewed the above documentation for accuracy and completeness, and I agree with the above.

## 2021-12-30 ENCOUNTER — Other Ambulatory Visit: Payer: Self-pay | Admitting: Hematology and Oncology

## 2021-12-30 DIAGNOSIS — C50412 Malignant neoplasm of upper-outer quadrant of left female breast: Secondary | ICD-10-CM

## 2021-12-31 ENCOUNTER — Other Ambulatory Visit: Payer: Self-pay | Admitting: *Deleted

## 2021-12-31 ENCOUNTER — Other Ambulatory Visit: Payer: Self-pay

## 2021-12-31 ENCOUNTER — Inpatient Hospital Stay: Payer: 59

## 2021-12-31 ENCOUNTER — Inpatient Hospital Stay: Payer: 59 | Attending: Hematology and Oncology | Admitting: Hematology and Oncology

## 2021-12-31 VITALS — BP 161/99 | HR 65 | Temp 97.9°F | Resp 14 | Wt 166.0 lb

## 2021-12-31 DIAGNOSIS — Z79899 Other long term (current) drug therapy: Secondary | ICD-10-CM | POA: Insufficient documentation

## 2021-12-31 DIAGNOSIS — Z780 Asymptomatic menopausal state: Secondary | ICD-10-CM | POA: Diagnosis not present

## 2021-12-31 DIAGNOSIS — E663 Overweight: Secondary | ICD-10-CM | POA: Diagnosis not present

## 2021-12-31 DIAGNOSIS — I890 Lymphedema, not elsewhere classified: Secondary | ICD-10-CM | POA: Diagnosis not present

## 2021-12-31 DIAGNOSIS — Z79811 Long term (current) use of aromatase inhibitors: Secondary | ICD-10-CM | POA: Diagnosis not present

## 2021-12-31 DIAGNOSIS — Z888 Allergy status to other drugs, medicaments and biological substances status: Secondary | ICD-10-CM | POA: Diagnosis not present

## 2021-12-31 DIAGNOSIS — Z170 Estrogen receptor positive status [ER+]: Secondary | ICD-10-CM | POA: Insufficient documentation

## 2021-12-31 DIAGNOSIS — M791 Myalgia, unspecified site: Secondary | ICD-10-CM | POA: Diagnosis not present

## 2021-12-31 DIAGNOSIS — M2550 Pain in unspecified joint: Secondary | ICD-10-CM | POA: Diagnosis not present

## 2021-12-31 DIAGNOSIS — Z6825 Body mass index (BMI) 25.0-25.9, adult: Secondary | ICD-10-CM | POA: Insufficient documentation

## 2021-12-31 DIAGNOSIS — C50412 Malignant neoplasm of upper-outer quadrant of left female breast: Secondary | ICD-10-CM

## 2021-12-31 DIAGNOSIS — M810 Age-related osteoporosis without current pathological fracture: Secondary | ICD-10-CM | POA: Diagnosis not present

## 2021-12-31 LAB — CBC WITH DIFFERENTIAL (CANCER CENTER ONLY)
Abs Immature Granulocytes: 0.01 10*3/uL (ref 0.00–0.07)
Basophils Absolute: 0 10*3/uL (ref 0.0–0.1)
Basophils Relative: 1 %
Eosinophils Absolute: 0.1 10*3/uL (ref 0.0–0.5)
Eosinophils Relative: 1 %
HCT: 42.5 % (ref 36.0–46.0)
Hemoglobin: 14.6 g/dL (ref 12.0–15.0)
Immature Granulocytes: 0 %
Lymphocytes Relative: 32 %
Lymphs Abs: 1.7 10*3/uL (ref 0.7–4.0)
MCH: 31.9 pg (ref 26.0–34.0)
MCHC: 34.4 g/dL (ref 30.0–36.0)
MCV: 93 fL (ref 80.0–100.0)
Monocytes Absolute: 0.5 10*3/uL (ref 0.1–1.0)
Monocytes Relative: 9 %
Neutro Abs: 3 10*3/uL (ref 1.7–7.7)
Neutrophils Relative %: 57 %
Platelet Count: 205 10*3/uL (ref 150–400)
RBC: 4.57 MIL/uL (ref 3.87–5.11)
RDW: 12.5 % (ref 11.5–15.5)
WBC Count: 5.3 10*3/uL (ref 4.0–10.5)
nRBC: 0 % (ref 0.0–0.2)

## 2021-12-31 LAB — CMP (CANCER CENTER ONLY)
ALT: 23 U/L (ref 0–44)
AST: 18 U/L (ref 15–41)
Albumin: 4.5 g/dL (ref 3.5–5.0)
Alkaline Phosphatase: 58 U/L (ref 38–126)
Anion gap: 8 (ref 5–15)
BUN: 11 mg/dL (ref 6–20)
CO2: 26 mmol/L (ref 22–32)
Calcium: 9 mg/dL (ref 8.9–10.3)
Chloride: 106 mmol/L (ref 98–111)
Creatinine: 0.67 mg/dL (ref 0.44–1.00)
GFR, Estimated: >60 mL/min (ref >60)
Glucose, Bld: 107 mg/dL — ABNORMAL HIGH (ref 70–99)
Potassium: 3.6 mmol/L (ref 3.5–5.1)
Sodium: 140 mmol/L (ref 135–145)
Total Bilirubin: 0.6 mg/dL (ref 0.3–1.2)
Total Protein: 7 g/dL (ref 6.5–8.1)

## 2021-12-31 LAB — LIPID PANEL
Cholesterol: 143 mg/dL (ref 0–200)
HDL: 55 mg/dL (ref >40)
LDL Cholesterol: 67 mg/dL (ref 0–99)
Total CHOL/HDL Ratio: 2.6 RATIO
Triglycerides: 104 mg/dL (ref <150)
VLDL: 21 mg/dL (ref 0–40)

## 2021-12-31 NOTE — Assessment & Plan Note (Signed)
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 Adjuvant chemotherapy with dose dense Adriamycin and Cytoxan x 4 followed by Abraxane weekly 12 because of lymph node positive disease. Started 09/04/14 to 01/15/15 2. adjuvant radiation completed 03/20/15 3. Followed by antiestrogen therapy started 04/03/15 -------------------------------------------------------------------------------------------------------------------------- Anastrozole toxicities: Patient denies any hot flashes. Weight gain Arthralgias and myalgias  PALLAS Trial : Patient has been randomized to anastrozole alone. ABC clinical trial: The trial randomizes between aspirin versus placebo. She went on trial 07/26/2015 Currently on 100 mg of dosage.No adverse effects from participating in the trial.  Adverse effects: 1. Bruising/ Hematoma: Currently taking100 mg of aspirin/placebo:Mild 2. Osteoporosis: Fosamax weekly,DEXA scan 07/28/2019: T score -2.1 (improved from -2.5)  Cognitive dysfunction: OnAricept 10 mg daily, followed by PCP, doing much better Profound lymphedema: Diffuse.  Response to Lasix I renewed her prescription for Lasix and potassium.  We will refer her back to physical therapy for SOZO and evaluation for lymphedema garment.  Breast cancer surveillance: 1. Mammogram5/30/2023benign 2. Breast exam10/24/2023: No palpable lumps or nodules of concern.  Bone density 07/26/2015: T score -1.9, osteopenia Bone density: 07/28/19 T score -2.1currently on Fosamax  No clinical evidence of breast cancer  recurrence.  03/08/2021: PET CT scan: 9 mm posterior left lower lobe nodule 04/01/2021: Left lower lobe wedge resection (Dr. 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

## 2022-01-08 LAB — NICOTINE/COTININE METABOLITES
Cotinine: <1 ng/mL
Nicotine: <1 ng/mL

## 2022-01-21 ENCOUNTER — Ambulatory Visit (HOSPITAL_BASED_OUTPATIENT_CLINIC_OR_DEPARTMENT_OTHER)
Admission: RE | Admit: 2022-01-21 | Discharge: 2022-01-21 | Disposition: A | Discharge status: Home / Self Care | Payer: 59 | Source: Ambulatory Visit | Attending: Hematology and Oncology | Admitting: Hematology and Oncology

## 2022-01-21 DIAGNOSIS — C50412 Malignant neoplasm of upper-outer quadrant of left female breast: Secondary | ICD-10-CM | POA: Diagnosis present

## 2022-01-21 DIAGNOSIS — Z170 Estrogen receptor positive status [ER+]: Secondary | ICD-10-CM | POA: Diagnosis present

## 2022-01-21 DIAGNOSIS — Z780 Asymptomatic menopausal state: Secondary | ICD-10-CM | POA: Insufficient documentation

## 2022-02-14 ENCOUNTER — Other Ambulatory Visit: Payer: Self-pay | Admitting: Hematology and Oncology

## 2022-02-14 DIAGNOSIS — C50412 Malignant neoplasm of upper-outer quadrant of left female breast: Secondary | ICD-10-CM

## 2022-03-05 ENCOUNTER — Encounter: Payer: Self-pay | Admitting: Family Medicine

## 2022-03-05 ENCOUNTER — Ambulatory Visit: Payer: PRIVATE HEALTH INSURANCE | Admitting: Family Medicine

## 2022-03-05 VITALS — BP 133/77 | HR 77 | Temp 98.4°F | Ht 64.0 in | Wt 166.0 lb

## 2022-03-05 DIAGNOSIS — Z23 Encounter for immunization: Secondary | ICD-10-CM

## 2022-03-05 DIAGNOSIS — Z0001 Encounter for general adult medical examination with abnormal findings: Secondary | ICD-10-CM

## 2022-03-05 DIAGNOSIS — R739 Hyperglycemia, unspecified: Secondary | ICD-10-CM | POA: Diagnosis not present

## 2022-03-05 DIAGNOSIS — R911 Solitary pulmonary nodule: Secondary | ICD-10-CM

## 2022-03-05 DIAGNOSIS — E663 Overweight: Secondary | ICD-10-CM

## 2022-03-05 DIAGNOSIS — Z170 Estrogen receptor positive status [ER+]: Secondary | ICD-10-CM

## 2022-03-05 DIAGNOSIS — E7800 Pure hypercholesterolemia, unspecified: Secondary | ICD-10-CM

## 2022-03-05 DIAGNOSIS — I700 Atherosclerosis of aorta: Secondary | ICD-10-CM

## 2022-03-05 DIAGNOSIS — C50412 Malignant neoplasm of upper-outer quadrant of left female breast: Secondary | ICD-10-CM

## 2022-03-05 DIAGNOSIS — Z9889 Other specified postprocedural states: Secondary | ICD-10-CM

## 2022-03-05 DIAGNOSIS — L089 Local infection of the skin and subcutaneous tissue, unspecified: Secondary | ICD-10-CM

## 2022-03-05 DIAGNOSIS — Z713 Dietary counseling and surveillance: Secondary | ICD-10-CM

## 2022-03-05 DIAGNOSIS — H8101 Meniere's disease, right ear: Secondary | ICD-10-CM

## 2022-03-05 DIAGNOSIS — E559 Vitamin D deficiency, unspecified: Secondary | ICD-10-CM

## 2022-03-05 DIAGNOSIS — Z0000 Encounter for general adult medical examination without abnormal findings: Secondary | ICD-10-CM

## 2022-03-05 DIAGNOSIS — M818 Other osteoporosis without current pathological fracture: Secondary | ICD-10-CM

## 2022-03-05 LAB — BAYER DCA HB A1C WAIVED: HB A1C (BAYER DCA - WAIVED): 5.5 % (ref 4.8–5.6)

## 2022-03-05 MED ORDER — PAROXETINE HCL ER 25 MG PO TB24: 25.0000 mg | ORAL_TABLET | Freq: Every day | ORAL | 3 refills | Status: DC

## 2022-03-05 MED ORDER — DONEPEZIL HCL 10 MG PO TABS: 10.0000 mg | ORAL_TABLET | Freq: Every day | ORAL | 12 refills | Status: DC

## 2022-03-05 MED ORDER — CLONAZEPAM 0.5 MG PO TABS: ORAL_TABLET | ORAL | 5 refills | Status: DC

## 2022-03-05 MED ORDER — VITAMIN D (ERGOCALCIFEROL) 1.25 MG (50000 UNIT) PO CAPS: ORAL_CAPSULE | ORAL | 3 refills | Status: DC

## 2022-03-05 MED ORDER — ALENDRONATE SODIUM 70 MG PO TABS: ORAL_TABLET | ORAL | 3 refills | Status: DC

## 2022-03-05 MED ORDER — ROSUVASTATIN CALCIUM 10 MG PO TABS: 10.0000 mg | ORAL_TABLET | Freq: Every day | ORAL | 3 refills | Status: DC

## 2022-03-05 MED ORDER — MECLIZINE HCL 25 MG PO TABS: 25.0000 mg | ORAL_TABLET | Freq: Three times a day (TID) | ORAL | 1 refills | Status: AC | PRN

## 2022-03-05 MED ORDER — DOXYCYCLINE HYCLATE 100 MG PO TABS: 100.0000 mg | ORAL_TABLET | Freq: Two times a day (BID) | ORAL | 0 refills | Status: AC

## 2022-03-05 NOTE — Progress Notes (Signed)
[name] is a 61 y.o. female presents to office today for annual physical exam examination.    Concerns today include: 1.  Spot on left cheek Patient reports that this spot developed a couple of days ago.  It started as a pimple-like lesion.  She is not sure if the shower tabs that she has been using over the last week are causing some purification of the skin but she used something to pop the lesion on her face and she continues to have some fullness and swelling in that area.  No fevers reported.  No pain with eye movement reported.  No significant drainage from the eye or tearing reported.  2.  Overweight with hyperlipidemia Patient had lipid panel performed by her oncologist recently.  She continues to take Crestor as directed.  No reports of chest pain or shortness of breath but she does report frustration over inability to lose weight.  She invest in some type of over-the-counter weight loss gummy but has not really noticed any difference so far.  She typically drinks water, black tea.  She grills most foods.  She does cook with some butter.  Denies any excessive consumption of carbohydrates.  Diet: as above, Exercise:  plans for new regimen soon Last eye exam: UTD Last dental exam: UTD Last colonoscopy: UTD Last mammogram: UTD Last pap smear: UTD Refills needed today: all Immunizations needed: Shingrix 2 Immunization History  Administered Date(s) Administered   Influenza Inj Mdck Quad Pf 12/08/2018   Influenza,inj,Quad PF,6+ Mos 02/06/2014, 01/09/2016, 12/14/2017, 03/06/2020, 12/26/2020   Influenza-Unspecified 12/08/2018   Moderna Sars-Covid-2 Vaccination 04/05/2019, 05/03/2019, 01/11/2020   PNEUMOCOCCAL CONJUGATE-20 02/22/2021   Zoster Recombinat (Shingrix) 09/03/2021     Past Medical History:  Diagnosis Date   Abnormal Pap smear of cervix 2002   Dr. Eilleen Kempf    Allergy 2012   seasonal   Anxiety    Arthritis    Benign hemangioma    Dr,  Marybelle Killings    Breast  cancer ([place]) 07/20/2014   Left Breast   Cervical stenosis (uterine cervix) 2002   Depression    Dizziness 1991   secondary to Meniere's disease   Hyperlipidemia 2007   Insomnia 1997   Meniere's disease 1991   Nervousness(799.21) 1987   Social History   Socioeconomic History   Marital status: Married    Spouse name: Belenda Cruise   Number of children: 2   Years of education: Not on file   Highest education level: Not on file  Occupational History   Not on file  Tobacco Use   Smoking status: Never   Smokeless tobacco: Never  Vaping Use   Vaping Use: Never used  Substance and Sexual Activity   Alcohol use: Not Currently    Comment: very rare   Drug use: No   Sexual activity: Yes    Birth control/protection: Surgical    Comment: ablation  Other Topics Concern   Not on file  Social History Narrative   Not on file   Social Determinants of Health   Financial Resource Strain: Not on file  Food Insecurity: Not on file  Transportation Needs: Not on file  Physical Activity: Not on file  Stress: Not on file  Social Connections: Not on file  Intimate Partner Violence: Not on file   Past Surgical History:  Procedure Laterality Date   AXILLARY LYMPH NODE DISSECTION Left 08/16/2014   Procedure: AXILLARY LYMPH NODE DISSECTION;  Surgeon: Coralie Keens, MD;  Location: [place];  Service: General;  Laterality: Left;   bilateral  tubal ligation  05/08/2004   BREAST LUMPECTOMY WITH RADIOACTIVE SEED AND SENTINEL LYMPH NODE BIOPSY Left 08/08/2014   Procedure: BREAST LUMPECTOMY WITH RADIOACTIVE SEED AND SENTINEL LYMPH NODE BIOPSY;  Surgeon: Coralie Keens, MD;  Location: [place];  Service: General;  Laterality: Left;   BREAST REDUCTION SURGERY Right    CHOLECYSTECTOMY  12/30/2016   COLONOSCOPY  02/18/2018   Dr.Perry   FIDUCIAL MARKER PLACEMENT  04/01/2021   Procedure: fiducial dye marking;  Surgeon: Collene Gobble, MD;  Location: [facility];   Service: Pulmonary;;   INTERCOSTAL NERVE BLOCK Left 04/01/2021   Procedure: INTERCOSTAL NERVE BLOCK;  Surgeon: Melrose Nakayama, MD;  Location: [place];  Service: Thoracic;  Laterality: Left;   LYMPH NODE DISSECTION N/A 04/01/2021   Procedure: LYMPH NODE DISSECTION;  Surgeon: Melrose Nakayama, MD;  [facility];  Service: Thoracic;  Laterality: N/A;   MASS EXCISION Left 01/08/2018   Procedure: EXCISION MUCOID CYST, DEBRIDEMENT DISTAL INTERPHALANGEAL LEFT MIDDLE FINGER;  Surgeon: Daryll Brod, MD;  Location: [place];  Service: Orthopedics;  Laterality: Left;   MASTOPEXY Right 12/14/2015   Procedure: RIGHT BREAST MASTOPEXY;  Surgeon: Irene Limbo, MD;  Location: [place];  Service: Plastics;  Laterality: Right;   PORT-A-CATH REMOVAL N/A 03/08/2015   Procedure: REMOVAL PORT-A-CATH;  Surgeon: Coralie Keens, MD;  Location: [facility];  Service: General;  Laterality: N/A;   PORTACATH PLACEMENT Right 08/16/2014   Procedure: INSERTION PORT-A-CATH;  Surgeon: Coralie Keens, MD;  Location: [place];  Service: General;  Laterality: Right;   RE-EXCISION OF BREAST LUMPECTOMY Left 08/16/2014   Procedure: RE-EXCISION OF LEFT BREAST CANCER AND PORT PLACEMENT/POSSIBLE AXILLARY DISECTION;  Surgeon: Coralie Keens, MD;  Location: [place];  Service: General;  Laterality: Left;   right breast surg     [place] Right 07/28/2012   Procedure: RIGHT SHOULDER DECOMPRESSION AND EXCISION OF CALCIFIC DEPOSIT;  Surgeon: Tobi Bastos, MD;  Location: [facility];  Service: Orthopedics;  Laterality: Right;   TUBAL LIGATION     uterine ablation  02/2006    Due to heavy periods Dr. Jon Billings    VIDEO BRONCHOSCOPY WITH RADIAL ENDOBRONCHIAL ULTRASOUND  04/01/2021   Procedure: VIDEO BRONCHOSCOPY WITH RADIAL ENDOBRONCHIAL ULTRASOUND;  Surgeon: Collene Gobble, MD;  Location: [facility];  Service: Pulmonary;;    wisdom teeth extractions  yrs ago   Family History  Problem Relation Age of Onset   Cirrhosis Father    Colon polyps Mother    Colon cancer Neg Hx    Esophageal cancer Neg Hx    Rectal cancer Neg Hx    Stomach cancer Neg Hx     Current Outpatient Medications:    acetaminophen (TYLENOL) 500 MG tablet, Take 1,500 mg by mouth daily as needed for moderate pain., Disp: , Rfl:    albuterol (VENTOLIN HFA) 108 (90 Base) MCG/ACT inhaler, Inhale 2 puffs into the lungs every 6 (six) hours as needed for wheezing or shortness of breath., Disp: 8 g, Rfl: 6   alendronate (FOSAMAX) 70 MG tablet, TAKE 1 TABLET ONCE A WEEK. TAKE WITH A FULL GLASS OF WATER ON AN EMPTY STOMACH., Disp: 12 tablet, Rfl: 3   anastrozole (ARIMIDEX) 1 MG tablet, TAKE 1 TABLET BY MOUTH EVERY DAY, Disp: 90 tablet, Rfl: 3   Biotin 10000 MCG TABS, Take 10,000 mcg by mouth daily., Disp: ,  Rfl:    Calcium Carb-Cholecalciferol (CALCIUM 600 + D PO), Take 1 tablet by mouth at bedtime., Disp: , Rfl:    clobetasol cream (TEMOVATE) 8.76 %, Apply 1 application topically 2 (two) times daily as needed (irritation)., Disp: , Rfl:    clonazePAM (KLONOPIN) 0.5 MG tablet, 1 tablet 3 times daily as needed for meniere's attack, Disp: 90 tablet, Rfl: 5   donepezil (ARICEPT) 10 MG tablet, Take 1 tablet (10 mg total) by mouth at bedtime., Disp: 31 tablet, Rfl: 12   furosemide (LASIX) 20 MG tablet, TAKE 1 TABLET BY MOUTH EVERY DAY, Disp: 30 tablet, Rfl: 3   meclizine (ANTIVERT) 25 MG tablet, TAKE 1 TABLET (25 MG TOTAL) BY MOUTH 3 (THREE) TIMES DAILY AS NEEDED FOR DIZZINESS., Disp: 30 tablet, Rfl: 1   PARoxetine (PAXIL-CR) 25 MG 24 hr tablet, Take 1 tablet (25 mg total) by mouth daily., Disp: 30 tablet, Rfl: 11   potassium chloride (KLOR-CON) 10 MEQ tablet, Take 1 tablet (10 mEq total) by mouth daily as needed (when taking lasix)., Disp: 90 tablet, Rfl: 3   rosuvastatin (CRESTOR) 10 MG tablet, Take 1 tablet (10 mg total) by mouth daily., Disp: 30 tablet,  Rfl: 12   triamcinolone cream (KENALOG) 0.1 %, Apply 1 application topically 2 (two) times daily as needed (irritation)., Disp: , Rfl:    Turmeric 500 MG TABS, Take 1 tablet by mouth 2 (two) times daily., Disp: , Rfl:    vitamin B-12 (CYANOCOBALAMIN) 1000 MCG tablet, Take 1,000 mcg by mouth at bedtime., Disp: , Rfl:    Vitamin D, Ergocalciferol, (DRISDOL) 1.25 MG (50000 UNIT) CAPS capsule, TAKE 1 CAPSULE (50,000 UNITS TOTAL) BY MOUTH EVERY 7 (SEVEN) DAYS., Disp: 12 capsule, Rfl: 3  Allergies  Allergen Reactions   Statins Other (See Comments)    Muscle pain   Lipitor [Atorvastatin] Other (See Comments)    myalgias   Temazepam Other (See Comments)    Pt does not remember (Restoril)     ROS: Review of Systems Pertinent items noted in HPI and remainder of comprehensive ROS otherwise negative.    Physical exam BP 133/77   Pulse 77   Temp 98.4 F (36.9 C)   Ht 5\' 4"  (1.626 m)   Wt 166 lb (75.3 kg)   SpO2 94%   BMI 28.49 kg/m  General appearance: alert, cooperative, appears stated age, and no distress Head: Normocephalic, she has a slightly erythematous, soft tissue swelling noted along the left maxillary region.  This is mildly indurated and nondraining.  There is warmth.  Central punctum appreciated. Eyes: negative findings: lids and lashes normal, conjunctivae and sclerae normal, and corneas clear Ears: normal TM's and external ear canals both ears Nose: Nares normal. Septum midline. Mucosa normal. No drainage or sinus tenderness. Throat: lips, mucosa, and tongue normal; teeth and gums normal Neck: no adenopathy, no carotid bruit, supple, symmetrical, trachea midline, and thyroid not enlarged, symmetric, no tenderness/mass/nodules Back: symmetric, no curvature. ROM normal. No CVA tenderness. Lungs: clear to auscultation bilaterally Heart: regular rate and rhythm, S1, S2 normal, no murmur, click, rub or gallop Abdomen: soft, non-tender; bowel sounds normal; no masses,  no  organomegaly Extremities: extremities normal, atraumatic, no cyanosis or edema Pulses: 2+ and symmetric Skin:  as above Lymph nodes: Cervical, supraclavicular, and axillary nodes normal. Neurologic: Grossly normal Psych: Mood stable, speech normal, affect appropriate     03/05/2022    8:05 AM 09/03/2021    8:36 AM 02/22/2021    8:30 AM  Depression  screen PHQ 2/9  Decreased Interest 0 0 0  Down, Depressed, Hopeless 0 0 0  PHQ - 2 Score 0 0 0  Altered sleeping  1   Tired, decreased energy  0   Change in appetite  0   Feeling bad or failure about yourself   0   Trouble concentrating  0   Moving slowly or fidgety/restless  0   Suicidal thoughts  0   PHQ-9 Score  1   Difficult doing work/chores  Not difficult at all       03/05/2022    8:05 AM 09/03/2021    8:37 AM 02/22/2021    8:30 AM 08/22/2020    8:04 AM  GAD 7 : Generalized Anxiety Score  Nervous, Anxious, on Edge 0 0 0 0  Control/stop worrying 0 0 0 0  Worry too much - different things 0 0 0 0  Trouble relaxing 0 0 0 0  Restless 0 0 0 0  Easily annoyed or irritable 0 0 0 0  Afraid - awful might happen 0 0 0 0  Total GAD 7 Score 0 0 0 0  Anxiety Difficulty Not difficult at all Not difficult at all Not difficult at all Not difficult at all    Assessment/ Plan: [name] here for annual physical exam.   Annual physical exam  Aortic atherosclerosis ([place])  Weight loss counseling, encounter for  Overweight (BMI 25.0-29.9)  Pure hypercholesterolemia  Nodule of upper lobe of right lung  History of lung surgery  Meniere's disease of right ear - Plan: clonazePAM (KLONOPIN) 0.5 MG tablet  Other osteoporosis without current pathological fracture - Plan: VITAMIN D 25 Hydroxy (Vit-D Deficiency, Fractures), alendronate (FOSAMAX) 70 MG tablet  Vitamin D deficiency - Plan: Vitamin D, Ergocalciferol, (DRISDOL) 1.25 MG (50000 UNIT) CAPS capsule  Malignant neoplasm of upper-outer quadrant of left breast in female,  estrogen receptor positive (HCC)  Elevated serum glucose - Plan: Bayer DCA Hb A1c Waived  Facial infection - Plan: doxycycline (VIBRA-TABS) 100 MG tablet  I reviewed her labs from oncology.  Noted to have slightly elevated serum glucose so A1c was added today which demonstrated no evidence of prediabetes or diabetes.  Weight loss counseling performed today.  We discussed GLP versus GIP.  She is going to run this by her oncologist but no apparent contraindications to initiation.  She understands that we need to see her back in 3 to 4 months if we start this medication for weight check.  She was amenable to this and will contact me with what medication is covered by her insurance  Her Mnire's is chronic and stable.  UDS and CSC are up-to-date.  National narcotic database reviewed and there were no red flags.  Clonazepam renewed.  Vitamin D level ordered today.  Continue vitamin D supplementation given known osteoporosis.  DEXA is up-to-date.  Continue Fosamax  Added doxycycline twice daily for 10 days for soft tissue infection noted in the danger zone of the face.  We discussed red flag signs and symptoms warranting further evaluation  Counseled on healthy lifestyle choices, including diet (rich in fruits, vegetables and lean meats and low in salt and simple carbohydrates) and exercise (at least 30 minutes of moderate physical activity daily).  Patient to follow up in 13m  Hennesy Sobalvarro M. Lajuana Ripple, DO

## 2022-03-05 NOTE — Patient Instructions (Signed)
Tips for success with Wegovy/ Zepbound (and by success, how not to be super sick on your stomach): Eat small meals AVOID heavy foods (fried/ high in carbs like bread, pasta, rice) AVOID carbonated beverages (soda/ beer, as these can increase bloating) DOUBLE your water intake (will help you avoid constipation/ dehydration)  Wegovy/Zepbound CAN cause: Nausea Abdominal pain Increased acid reflux (sometimes presents as "sour burps") Constipation OR Diarrhea Fatigue (especially when you first start it)

## 2022-03-06 LAB — VITAMIN D 25 HYDROXY (VIT D DEFICIENCY, FRACTURES): Vit D, 25-Hydroxy: 47.7 ng/mL (ref 30.0–100.0)

## 2022-03-07 NOTE — Progress Notes (Signed)
Patient returning call. Please call back

## 2022-04-14 ENCOUNTER — Encounter: Payer: Self-pay | Admitting: *Deleted

## 2022-05-29 ENCOUNTER — Other Ambulatory Visit: Payer: Self-pay | Admitting: Emergency Medicine

## 2022-06-18 ENCOUNTER — Ambulatory Visit: Payer: 59 | Admitting: Hematology and Oncology

## 2022-06-27 ENCOUNTER — Other Ambulatory Visit: Payer: Self-pay | Admitting: Emergency Medicine

## 2022-07-01 ENCOUNTER — Other Ambulatory Visit: Payer: Self-pay | Admitting: *Deleted

## 2022-07-01 ENCOUNTER — Telehealth: Payer: Self-pay | Admitting: Radiology

## 2022-07-01 DIAGNOSIS — Z170 Estrogen receptor positive status [ER+]: Secondary | ICD-10-CM

## 2022-07-01 NOTE — Telephone Encounter (Signed)
PALLAS: PALBOCICLIB COLLABORATIVE ADJUVANT STUDY: A RANDOMIZED PHASE III TRIAL OF PALBOCICLIB WITH STANDARD ADJUVANT ENDOCRINE THERAPY VERSUS STANDARD ADJUVANT ENDOCRINE THERAPY ALONE FOR HORMONE RECEPTOR POSITIVE (HR+)/HUMAN EPIDERMAL GROWTH FACTOR RECEPTOR 2 (HER2)-NEGATIVE EARLY BREAST CANCER   07/01/22  PHONE CALL: Confirmed I was speaking with [name] . Informed patient reason for call is to discuss research related labs. Patient confirmed she plans to pursue research related labs. This coordinator let patient know she reached out to Dr. Earmon [place] nurse to review if patient needs any standard of care labs at this visit. Thanked patient for her time and continued support of the above mentioned study.   Merri Brunette, RT(R)(T) Clinical Research Coordinator

## 2022-07-07 ENCOUNTER — Other Ambulatory Visit: Payer: Self-pay | Admitting: *Deleted

## 2022-07-07 DIAGNOSIS — Z170 Estrogen receptor positive status [ER+]: Secondary | ICD-10-CM

## 2022-07-08 ENCOUNTER — Encounter: Payer: Self-pay | Admitting: Family Medicine

## 2022-07-08 ENCOUNTER — Telehealth: Payer: Self-pay | Admitting: Hematology and Oncology

## 2022-07-08 ENCOUNTER — Telehealth (INDEPENDENT_AMBULATORY_CARE_PROVIDER_SITE_OTHER): Payer: PRIVATE HEALTH INSURANCE | Admitting: Family Medicine

## 2022-07-08 ENCOUNTER — Inpatient Hospital Stay: Payer: 59

## 2022-07-08 ENCOUNTER — Inpatient Hospital Stay: Payer: 59 | Admitting: Hematology and Oncology

## 2022-07-08 VITALS — Ht 65.0 in | Wt 168.0 lb

## 2022-07-08 DIAGNOSIS — Z6827 Body mass index (BMI) 27.0-27.9, adult: Secondary | ICD-10-CM

## 2022-07-08 DIAGNOSIS — E663 Overweight: Secondary | ICD-10-CM | POA: Diagnosis not present

## 2022-07-08 DIAGNOSIS — Z91038 Other insect allergy status: Secondary | ICD-10-CM

## 2022-07-08 DIAGNOSIS — E7800 Pure hypercholesterolemia, unspecified: Secondary | ICD-10-CM

## 2022-07-08 DIAGNOSIS — I700 Atherosclerosis of aorta: Secondary | ICD-10-CM

## 2022-07-08 MED ORDER — HYDROXYZINE HCL 25 MG PO TABS
12.5000 mg | ORAL_TABLET | Freq: Three times a day (TID) | ORAL | 0 refills | Status: AC | PRN
Start: 2022-07-08 — End: ?

## 2022-07-08 MED ORDER — CEPHALEXIN 500 MG PO CAPS
500.0000 mg | ORAL_CAPSULE | Freq: Two times a day (BID) | ORAL | 0 refills | Status: AC
Start: 2022-07-08 — End: 2022-07-15

## 2022-07-08 MED ORDER — CLOBETASOL PROPIONATE 0.05 % EX CREA
1.0000 | TOPICAL_CREAM | Freq: Two times a day (BID) | CUTANEOUS | 0 refills | Status: AC | PRN
Start: 2022-07-08 — End: ?

## 2022-07-08 NOTE — Progress Notes (Signed)
MyChart Video visit  Subjective: ZO:[id number] bite PCP: Raliegh Ip, DO [id number] [name] is a 62 y.o. female. Patient provides verbal consent for consult held via video.  Due to COVID-19 pandemic this visit was conducted virtually. This visit type was conducted due to national recommendations for restrictions regarding the COVID-19 Pandemic (e.g. social distancing, sheltering in place) in an effort to limit this patient's exposure and mitigate transmission in our community. All issues noted in this document were discussed and addressed.  A physical exam was not performed with this format.   Location of patient: [place] Location of provider: [facility] Others present for call: none  1.  Insect bite Patient reports that she was apparently bitten by some type of insect while she was working in the yard 3 days ago.  She has not noticed any worsening symptoms but she reports diffuse itching and soft tissue swelling of her right wrist.  She has another lesion on her right abdomen.  She has not used anything for this just yet because she wanted to consult with me.  No reports of fevers or chills.  2.  Overweight associate with aortic atherosclerosis and hyperlipidemia Patient wants to revisit the idea of GIP versus GLP.  She was cleared by oncology to start with these medicines but did not realize that she was supposed to call her insurance personally to check on coverage of this.  She will do that today.   ROS: Per HPI  Allergies  Allergen Reactions   Statins Other (See Comments)    Muscle pain   Lipitor [Atorvastatin] Other (See Comments)    myalgias   Temazepam Other (See Comments)    Pt does not remember (Restoril)   Past Medical History:  Diagnosis Date   Abnormal Pap smear of cervix 2002   Dr. Lexine Baton    Allergy 2012   seasonal   Anxiety    Arthritis    Benign hemangioma    Dr,  Guadelupe Sabin    Breast cancer [facility]) 07/20/2014   Left Breast   Cervical stenosis (uterine  cervix) 2002   Depression    Dizziness 1991   secondary to Meniere's disease   Hyperlipidemia 2007   Insomnia 1997   Meniere's disease 1991   Nervousness(799.21) 1987    Current Outpatient Medications:    acetaminophen (TYLENOL) 500 MG tablet, Take 1,500 mg by mouth daily as needed for moderate pain., Disp: , Rfl:    albuterol (VENTOLIN HFA) 108 (90 Base) MCG/ACT inhaler, TAKE 2 PUFFS BY MOUTH EVERY 6 HOURS AS NEEDED FOR WHEEZE OR SHORTNESS OF BREATH, Disp: 8.5 each, Rfl: 6   alendronate (FOSAMAX) 70 MG tablet, TAKE 1 TABLET ONCE A WEEK. TAKE WITH A FULL GLASS OF WATER ON AN EMPTY STOMACH., Disp: 12 tablet, Rfl: 3   anastrozole (ARIMIDEX) 1 MG tablet, TAKE 1 TABLET BY MOUTH EVERY DAY, Disp: 90 tablet, Rfl: 3   Biotin 47829 MCG TABS, Take 10,000 mcg by mouth daily., Disp: , Rfl:    Calcium Carb-Cholecalciferol (CALCIUM 600 + D PO), Take 1 tablet by mouth at bedtime., Disp: , Rfl:    clobetasol cream (TEMOVATE) 0.05 %, Apply 1 application topically 2 (two) times daily as needed (irritation)., Disp: , Rfl:    clonazePAM (KLONOPIN) 0.5 MG tablet, 1 tablet 3 times daily as needed for meniere's attack, Disp: 90 tablet, Rfl: 5   donepezil (ARICEPT) 10 MG tablet, Take 1 tablet (10 mg total) by mouth at bedtime., Disp: 31 tablet, Rfl: 12  furosemide (LASIX) 20 MG tablet, TAKE 1 TABLET BY MOUTH EVERY DAY, Disp: 30 tablet, Rfl: 3   meclizine (ANTIVERT) 25 MG tablet, Take 1 tablet (25 mg total) by mouth 3 (three) times daily as needed for dizziness., Disp: 30 tablet, Rfl: 1   PARoxetine (PAXIL-CR) 25 MG 24 hr tablet, Take 1 tablet (25 mg total) by mouth daily., Disp: 90 tablet, Rfl: 3   potassium chloride (KLOR-CON) 10 MEQ tablet, Take 1 tablet (10 mEq total) by mouth daily as needed (when taking lasix)., Disp: 90 tablet, Rfl: 3   rosuvastatin (CRESTOR) 10 MG tablet, Take 1 tablet (10 mg total) by mouth daily., Disp: 90 tablet, Rfl: 3   triamcinolone cream (KENALOG) 0.1 %, Apply 1 application topically  2 (two) times daily as needed (irritation)., Disp: , Rfl:    Turmeric 500 MG TABS, Take 1 tablet by mouth 2 (two) times daily., Disp: , Rfl:    vitamin B-12 (CYANOCOBALAMIN) 1000 MCG tablet, Take 1,000 mcg by mouth at bedtime., Disp: , Rfl:    Vitamin D, Ergocalciferol, (DRISDOL) 1.25 MG (50000 UNIT) CAPS capsule, TAKE 1 CAPSULE (50,000 UNITS TOTAL) BY MOUTH EVERY 7 (SEVEN) DAYS., Disp: 12 capsule, Rfl: 3  Height 5\' 5"  (1.651 m), weight 168 lb (76.2 kg). Body mass index is 27.96 kg/m.  Gen: well appearing female, overweight Skin: Marked soft tissue swelling noted along the ulnar aspect of the right wrist.  There is associated erythema.  Has similar welt-like lesion noted along the right side of the upper abdomen.  Able to move her right wrist without difficulty  Assessment/ Plan: 62 y.o. female   Allergic reaction to insect bite - Plan: hydrOXYzine (ATARAX) 25 MG tablet, clobetasol cream (TEMOVATE) 0.05 %, cephALEXin (KEFLEX) 500 MG capsule  Overweight (BMI 25.0-29.9)  Aortic atherosclerosis (HCC)  Pure hypercholesterolemia  Atarax as needed itching.  Clobetasol topically as needed itching and inflammation and will cover for soft tissue infection, particularly along that right wrist due to the marked swelling noted on exam.  Keflex ordered.  She will follow-up as needed  She inquired again about GIP and GLP.  She did not realize that she was supposed to be the one to call her insurance company to check on these so she will take care of that today as she is motivated to start this medication because she is still having difficulty losing weight.  She was cleared to use the injection therapy by her oncologist  Start time: 1:04p End time: 1:15pm  Total time spent on patient care (including video visit/ documentation): 11 minutes  Jabier Deese Hulen Skains, DO [facility] [phone]

## 2022-07-08 NOTE — Assessment & Plan Note (Deleted)
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 Adjuvant chemotherapy with dose dense Adriamycin and Cytoxan x 4 followed by Abraxane weekly 12 because of lymph node positive disease. Started 09/04/14 to 01/15/15 2. adjuvant radiation completed 03/20/15 3. Followed by antiestrogen therapy started 04/03/15 -------------------------------------------------------------------------------------------------------------------------- Anastrozole toxicities: Patient denies any hot flashes. Weight gain Arthralgias and myalgias    PALLAS Trial : Patient has been randomized to anastrozole alone. ABC clinical trial: The trial randomizes between aspirin versus placebo. She went on trial 07/26/2015 Currently on 100 mg of dosage.  No adverse effects from participating in the trial.   Adverse effects: 1. Bruising/ Hematoma:  Currently taking 100 mg of aspirin/placebo: Mild 2. Osteoporosis: Fosamax weekly, DEXA scan 07/28/2019: T score -2.1 (improved from -2.5)   Cognitive dysfunction: On Aricept 10 mg daily, followed by PCP, doing much better Profound lymphedema: Diffuse.  Response to Lasix I renewed her prescription for Lasix and potassium.  We will refer her back to physical therapy for SOZO and evaluation for lymphedema garment.   Breast cancer surveillance:  1. Mammogram 08/06/2021 benign 2. Breast exam 12/31/2021: No palpable lumps or nodules of concern.    Bone density 07/26/2015: T score -1.9, osteopenia Bone density: 07/28/19 T score -2.1 currently on Fosamax   No clinical evidence of breast cancer  recurrence.   03/08/2021: PET CT scan: 9 mm posterior left lower lobe nodule 04/01/2021: Left lower lobe wedge resection (Dr. 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

## 2022-07-08 NOTE — Telephone Encounter (Signed)
Called patient about appointment change. Unable to  leave patient vm mailbox full

## 2022-07-12 NOTE — Progress Notes (Signed)
TELEPHONE VISIT  Patient Care Team: Raliegh Ip, DO as PCP - General (Family Medicine) Serena Croissant, MD as Consulting Physician (Hematology and Oncology) Abigail Miyamoto, MD as Consulting Physician (General Surgery) Dorothy Puffer, MD as Consulting Physician (Radiation Oncology) Salomon Fick, NP as Nurse Practitioner (Hematology and Oncology)  DIAGNOSIS:  Encounter Diagnosis  Name Primary?   Malignant neoplasm of upper-outer quadrant of left breast in female, estrogen receptor positive (HCC) Yes    SUMMARY OF ONCOLOGIC HISTORY: Oncology History  Malignant neoplasm of upper-outer quadrant of left breast in female, estrogen receptor positive (HCC)  07/18/2014 Mammogram   Distortion left breast, breast density category C; U/S 1.3 x 0.8 x 0.8 cm left breast mass at 2:00 position 4 cm from nipple, no lymph nodes   07/20/2014 Initial Diagnosis   Left breast Biopsy: Invasive lobular cancer with LCIS, Grade 1, ER 86%, PR 78%, Her 2 Neg Ratio 1.77, Ki 67: 14%   08/01/2014 Breast MRI   Breast MRI showed non-mass enhancement 3.9 cm, no lymph nodes   08/01/2014 Clinical Stage   Stage IIA: T2 N0   08/08/2014 Surgery   Left breast lumpectomy: 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, with LCIS, medial and inferior margins positive, 3/4 lymph nodes positive   08/08/2014 Pathologic Stage   Stage IIB: T2 N1c M0   08/16/2014 Surgery   Left breast medial margin reexcision residual ILC 0.2 cm; inferior margin residual foci less than 0.2 cm, 1/5 lymph nodes positive, 1 lymph node with isolated tumor cells (Overall 4/10)   09/04/2014 - 01/15/2015 Chemotherapy   Adjuvant chemotherapy with dose dense Adriamycin and Cytoxan 4 followed by Abraxane weekly 8 ( stopped early for profound neutropenia and thrombocytopenia)   01/29/2015 - 03/20/2015 Radiation Therapy   Adjuvant XRT [facility]): 50.4 Gy over 28 fractions; seroma boost: 10 Gy over 5 fractions. Total dose: 60.4 Gy   04/11/2015 -   Anti-estrogen oral therapy   Anastrozole 1 mg daily (PALLAS clinical trial 05/24/2015 patient was randomized to hormone therapy alone)   05/24/2015 Survivorship   Survivorship visit completed and copy of care plan given to patient   11/26/2016 PET scan   Small Left sided level 2 LN with hypermetabolism, post surg changes in breast and small right groin lymph node likely infectious/inflammatory, gallstones     CHIEF COMPLIANT: Follow-up of left breast cancer on anastrozole   INTERVAL HISTORY: [name] is a 62 y.o. with above-mentioned history of left breast cancer treated with lumpectomy, adjuvant chemotherapy, and radiation who is currently on oral antiestrogen therapy with anastrozole. She presents to the clinic today for follow-up.  She underwent CT chest abdomen pelvis for staging regarding her breast and lung cancer diagnosis and she is here connecting by telephone to discuss results.  She reports no new symptoms or concerns.   ALLERGIES:  is allergic to statins, lipitor [atorvastatin], and temazepam.  MEDICATIONS:  Current Outpatient Medications  Medication Sig Dispense Refill   acetaminophen (TYLENOL) 500 MG tablet Take 1,500 mg by mouth daily as needed for moderate pain.     albuterol (VENTOLIN HFA) 108 (90 Base) MCG/ACT inhaler TAKE 2 PUFFS BY MOUTH EVERY 6 HOURS AS NEEDED FOR WHEEZE OR SHORTNESS OF BREATH 8.5 each 6   alendronate (FOSAMAX) 70 MG tablet TAKE 1 TABLET ONCE A WEEK. TAKE WITH A FULL GLASS OF WATER ON AN EMPTY STOMACH. 12 tablet 3   anastrozole (ARIMIDEX) 1 MG tablet TAKE 1 TABLET BY MOUTH EVERY DAY 90 tablet 3  Biotin 19147 MCG TABS Take 10,000 mcg by mouth daily.     Calcium Carb-Cholecalciferol (CALCIUM 600 + D PO) Take 1 tablet by mouth at bedtime.     clobetasol cream (TEMOVATE) 0.05 % Apply 1 Application topically 2 (two) times daily as needed (irritation). 30 g 0   clonazePAM (KLONOPIN) 0.5 MG tablet 1 tablet 3 times daily as needed for meniere's attack  90 tablet 5   donepezil (ARICEPT) 10 MG tablet Take 1 tablet (10 mg total) by mouth at bedtime. 31 tablet 12   furosemide (LASIX) 20 MG tablet TAKE 1 TABLET BY MOUTH EVERY DAY 30 tablet 3   hydrOXYzine (ATARAX) 25 MG tablet Take 0.5-1 tablets (12.5-25 mg total) by mouth 3 (three) times daily as needed for itching. 30 tablet 0   meclizine (ANTIVERT) 25 MG tablet Take 1 tablet (25 mg total) by mouth 3 (three) times daily as needed for dizziness. 30 tablet 1   PARoxetine (PAXIL-CR) 25 MG 24 hr tablet Take 1 tablet (25 mg total) by mouth daily. 90 tablet 3   potassium chloride (KLOR-CON) 10 MEQ tablet Take 1 tablet (10 mEq total) by mouth daily as needed (when taking lasix). 90 tablet 3   rosuvastatin (CRESTOR) 10 MG tablet Take 1 tablet (10 mg total) by mouth daily. 90 tablet 3   triamcinolone cream (KENALOG) 0.1 % Apply 1 application topically 2 (two) times daily as needed (irritation).     Turmeric 500 MG TABS Take 1 tablet by mouth 2 (two) times daily.     vitamin B-12 (CYANOCOBALAMIN) 1000 MCG tablet Take 1,000 mcg by mouth at bedtime.     Vitamin D, Ergocalciferol, (DRISDOL) 1.25 MG (50000 UNIT) CAPS capsule TAKE 1 CAPSULE (50,000 UNITS TOTAL) BY MOUTH EVERY 7 (SEVEN) DAYS. 12 capsule 3   No current facility-administered medications for this visit.    PHYSICAL EXAMINATION: ECOG PERFORMANCE STATUS: 1 - Symptomatic but completely ambulatory  There were no vitals filed for this visit. There were no vitals filed for this visit.    LABORATORY DATA:  I have reviewed the data as listed    Latest Ref Rng & Units 07/16/2022    1:19 PM 07/16/2022   12:56 PM 12/31/2021    8:00 AM  CMP  Glucose 70 - 99 mg/dL  90  829   BUN 6 - 20 mg/dL  11  11   Creatinine 5.62 - 1.00 mg/dL 1.30  8.65  7.84   Sodium 135 - 145 mmol/L  140  140   Potassium 3.5 - 5.1 mmol/L  3.9  3.6   Chloride 98 - 111 mmol/L  105  106   CO2 22 - 32 mmol/L  21  26   Calcium 8.9 - 10.3 mg/dL  9.0  9.0   Total Protein 6.5 - 8.1  g/dL  7.2  7.0   Total Bilirubin 0.3 - 1.2 mg/dL  0.8  0.6   Alkaline Phos 38 - 126 U/L  52  58   AST 15 - 41 U/L  27  18   ALT 0 - 44 U/L  26  23     Lab Results  Component Value Date   WBC 7.3 07/16/2022   HGB 14.1 07/16/2022   HCT 41.6 07/16/2022   MCV 93.9 07/16/2022   PLT 213 07/16/2022   NEUTROABS 3.8 07/16/2022    ASSESSMENT & PLAN:  Malignant neoplasm of upper-outer quadrant of left breast in female, estrogen receptor positive (HCC) Left breast lumpectomy 08/08/2014: Invasive  grade 1 lobular carcinoma 2.6 cm, with LCIS, medial and inferior margins positive, 3/4 lymph nodes positive T2 N1 cM0 stage IIB, ER 86%, PR 78%, HER-2/neu negative ratio 1.77, Ki-67 14% Left breast medial margin reexcision 08/18/14:  residual ILC 0.2 cm; inferior margin residual foci less than 0.2 cm, 1/5 lymph nodes positive, 1 lymph node with isolated tumor cells (Overall 4/10)   Treatment Summary: 1. Adjuvant chemotherapy with dose dense Adriamycin and Cytoxan x 4 followed by Abraxane weekly 12 because of lymph node positive disease. Started 09/04/14 to 01/15/15 2. adjuvant radiation completed 03/20/15 3. Followed by antiestrogen therapy started 04/03/15 -------------------------------------------------------------------------------------------------------------------------- Anastrozole toxicities: Patient denies any hot flashes. Weight gain Arthralgias and myalgias    PALLAS Trial : Patient has been randomized to anastrozole alone. ABC clinical trial: The trial randomizes between aspirin versus placebo. She went on trial 07/26/2015 Currently on 100 mg of dosage.  No adverse effects from participating in the trial.   Adverse effects: 1. Bruising/ Hematoma:  Currently taking 100 mg of aspirin/placebo: Mild 2. Osteoporosis: Fosamax weekly, DEXA scan 07/28/2019: T score -2.1 (improved from -2.5)   Cognitive dysfunction: On Aricept 10 mg daily, followed by PCP, doing much better Profound lymphedema:  Diffuse.  Response to Lasix I renewed her prescription for Lasix and potassium.  We will refer her back to physical therapy for SOZO and evaluation for lymphedema garment.   Breast cancer surveillance:  1. Mammogram 08/06/2021 benign 2. Breast exam 12/31/2021: No palpable lumps or nodules of concern.    Bone density 07/26/2015: T score -1.9, osteopenia Bone density: 07/28/19 T score -2.1 currently on Fosamax   No clinical evidence of breast cancer recurrence.   03/08/2021: PET CT scan: 9 mm posterior left lower lobe nodule 04/01/2021: Left lower lobe wedge resection (Dr. 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   Return to clinic in 6 months with labs and follow-up in 1 year for scans    No orders of the defined types were placed in this encounter.  The patient has a good understanding of the overall plan. she agrees with it. she will call with any problems that may develop before the next visit here. Total time spent: 30 mins including face to face time and time spent for planning, charting and co-ordination of care   Tamsen Meek, MD 07/18/22    I Janan Ridge am acting as a Neurosurgeon for [employer]  I have reviewed the above documentation for accuracy and completeness, and I agree with the above.

## 2022-07-14 ENCOUNTER — Encounter: Payer: Self-pay | Admitting: Hematology and Oncology

## 2022-07-16 ENCOUNTER — Inpatient Hospital Stay: Payer: 59 | Attending: Hematology and Oncology

## 2022-07-16 ENCOUNTER — Ambulatory Visit (HOSPITAL_BASED_OUTPATIENT_CLINIC_OR_DEPARTMENT_OTHER)
Admission: RE | Admit: 2022-07-16 | Discharge: 2022-07-16 | Disposition: A | Discharge status: Home / Self Care | Payer: 59 | Source: Ambulatory Visit | Attending: Hematology and Oncology | Admitting: Hematology and Oncology

## 2022-07-16 DIAGNOSIS — Z170 Estrogen receptor positive status [ER+]: Secondary | ICD-10-CM | POA: Insufficient documentation

## 2022-07-16 DIAGNOSIS — Z79899 Other long term (current) drug therapy: Secondary | ICD-10-CM | POA: Diagnosis not present

## 2022-07-16 DIAGNOSIS — Z79811 Long term (current) use of aromatase inhibitors: Secondary | ICD-10-CM | POA: Insufficient documentation

## 2022-07-16 DIAGNOSIS — I700 Atherosclerosis of aorta: Secondary | ICD-10-CM | POA: Diagnosis not present

## 2022-07-16 DIAGNOSIS — C50412 Malignant neoplasm of upper-outer quadrant of left female breast: Secondary | ICD-10-CM | POA: Insufficient documentation

## 2022-07-16 DIAGNOSIS — Z888 Allergy status to other drugs, medicaments and biological substances status: Secondary | ICD-10-CM | POA: Insufficient documentation

## 2022-07-16 LAB — CMP (CANCER CENTER ONLY)
ALT: 26 U/L (ref 0–44)
AST: 27 U/L (ref 15–41)
Albumin: 4.3 g/dL (ref 3.5–5.0)
Alkaline Phosphatase: 52 U/L (ref 38–126)
Anion gap: 14 (ref 5–15)
BUN: 11 mg/dL (ref 6–20)
CO2: 21 mmol/L — ABNORMAL LOW (ref 22–32)
Calcium: 9 mg/dL (ref 8.9–10.3)
Chloride: 105 mmol/L (ref 98–111)
Creatinine: 0.68 mg/dL (ref 0.44–1.00)
GFR, Estimated: >60 mL/min (ref >60)
Glucose, Bld: 90 mg/dL (ref 70–99)
Potassium: 3.9 mmol/L (ref 3.5–5.1)
Sodium: 140 mmol/L (ref 135–145)
Total Bilirubin: 0.8 mg/dL (ref 0.3–1.2)
Total Protein: 7.2 g/dL (ref 6.5–8.1)

## 2022-07-16 LAB — CBC WITH DIFFERENTIAL (CANCER CENTER ONLY)
Abs Immature Granulocytes: 0.01 10*3/uL (ref 0.00–0.07)
Basophils Absolute: 0.1 10*3/uL (ref 0.0–0.1)
Basophils Relative: 1 %
Eosinophils Absolute: 0.1 10*3/uL (ref 0.0–0.5)
Eosinophils Relative: 2 %
HCT: 41.6 % (ref 36.0–46.0)
Hemoglobin: 14.1 g/dL (ref 12.0–15.0)
Immature Granulocytes: 0 %
Lymphocytes Relative: 37 %
Lymphs Abs: 2.7 10*3/uL (ref 0.7–4.0)
MCH: 31.8 pg (ref 26.0–34.0)
MCHC: 33.9 g/dL (ref 30.0–36.0)
MCV: 93.9 fL (ref 80.0–100.0)
Monocytes Absolute: 0.6 10*3/uL (ref 0.1–1.0)
Monocytes Relative: 8 %
Neutro Abs: 3.8 10*3/uL (ref 1.7–7.7)
Neutrophils Relative %: 52 %
Platelet Count: 213 10*3/uL (ref 150–400)
RBC: 4.43 MIL/uL (ref 3.87–5.11)
RDW: 12.7 % (ref 11.5–15.5)
WBC Count: 7.3 10*3/uL (ref 4.0–10.5)
nRBC: 0 % (ref 0.0–0.2)

## 2022-07-16 LAB — POCT I-STAT CREATININE: Creatinine, Ser: 0.5 mg/dL (ref 0.44–1.00)

## 2022-07-16 LAB — RESEARCH LABS

## 2022-07-16 MED ORDER — IOHEXOL 300 MG/ML  SOLN
100.0000 mL | Freq: Once | INTRAMUSCULAR | Status: AC | PRN
  Administered 2022-07-16: 85 mL via INTRAVENOUS

## 2022-07-17 ENCOUNTER — Encounter: Payer: Self-pay | Admitting: Hematology and Oncology

## 2022-07-18 ENCOUNTER — Inpatient Hospital Stay (HOSPITAL_BASED_OUTPATIENT_CLINIC_OR_DEPARTMENT_OTHER): Payer: 59 | Admitting: Hematology and Oncology

## 2022-07-18 DIAGNOSIS — Z170 Estrogen receptor positive status [ER+]: Secondary | ICD-10-CM

## 2022-07-18 DIAGNOSIS — C50412 Malignant neoplasm of upper-outer quadrant of left female breast: Secondary | ICD-10-CM | POA: Diagnosis not present

## 2022-07-18 NOTE — Assessment & Plan Note (Signed)
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 Adjuvant chemotherapy with dose dense Adriamycin and Cytoxan x 4 followed by Abraxane weekly 12 because of lymph node positive disease. Started 09/04/14 to 01/15/15 2. adjuvant radiation completed 03/20/15 3. Followed by antiestrogen therapy started 04/03/15 -------------------------------------------------------------------------------------------------------------------------- Anastrozole toxicities: Patient denies any hot flashes. Weight gain Arthralgias and myalgias    PALLAS Trial : Patient has been randomized to anastrozole alone. ABC clinical trial: The trial randomizes between aspirin versus placebo. She went on trial 07/26/2015 Currently on 100 mg of dosage.  No adverse effects from participating in the trial.   Adverse effects: 1. Bruising/ Hematoma:  Currently taking 100 mg of aspirin/placebo: Mild 2. Osteoporosis: Fosamax weekly, DEXA scan 07/28/2019: T score -2.1 (improved from -2.5)   Cognitive dysfunction: On Aricept 10 mg daily, followed by PCP, doing much better Profound lymphedema: Diffuse.  Response to Lasix I renewed her prescription for Lasix and potassium.  We will refer her back to physical therapy for SOZO and evaluation for lymphedema garment.   Breast cancer surveillance:  1. Mammogram 08/06/2021 benign 2. Breast exam 12/31/2021: No palpable lumps or nodules of concern.    Bone density 07/26/2015: T score -1.9, osteopenia Bone density: 07/28/19 T score -2.1 currently on Fosamax   No clinical evidence of breast cancer  recurrence.   03/08/2021: PET CT scan: 9 mm posterior left lower lobe nodule 04/01/2021: Left lower lobe wedge resection (Dr. 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

## 2022-07-21 ENCOUNTER — Telehealth: Payer: Self-pay | Admitting: Hematology and Oncology

## 2022-07-21 NOTE — Telephone Encounter (Signed)
Scheduled appointment per 5/10 los. Patient is aware of the made appointment. 

## 2022-08-14 ENCOUNTER — Encounter: Payer: Self-pay | Admitting: Hematology and Oncology

## 2022-09-03 ENCOUNTER — Encounter: Payer: Self-pay | Admitting: Family Medicine

## 2022-09-03 ENCOUNTER — Ambulatory Visit (INDEPENDENT_AMBULATORY_CARE_PROVIDER_SITE_OTHER): Payer: PRIVATE HEALTH INSURANCE | Admitting: Family Medicine

## 2022-09-03 VITALS — BP 124/66 | HR 71 | Temp 97.8°F | Resp 20 | Ht 65.0 in | Wt 167.4 lb

## 2022-09-03 DIAGNOSIS — L089 Local infection of the skin and subcutaneous tissue, unspecified: Secondary | ICD-10-CM | POA: Diagnosis not present

## 2022-09-03 DIAGNOSIS — H8101 Meniere's disease, right ear: Secondary | ICD-10-CM

## 2022-09-03 DIAGNOSIS — Z79899 Other long term (current) drug therapy: Secondary | ICD-10-CM | POA: Diagnosis not present

## 2022-09-03 DIAGNOSIS — W57XXXA Bitten or stung by nonvenomous insect and other nonvenomous arthropods, initial encounter: Secondary | ICD-10-CM

## 2022-09-03 DIAGNOSIS — S40861A Insect bite (nonvenomous) of right upper arm, initial encounter: Secondary | ICD-10-CM

## 2022-09-03 MED ORDER — CLONAZEPAM 0.5 MG PO TABS
ORAL_TABLET | ORAL | 5 refills | Status: DC
Start: 2022-09-19 — End: 2023-03-06

## 2022-09-03 MED ORDER — CEPHALEXIN 500 MG PO CAPS
500.0000 mg | ORAL_CAPSULE | Freq: Two times a day (BID) | ORAL | 0 refills | Status: AC | PRN
Start: 2022-09-03 — End: 2022-09-13

## 2022-09-03 NOTE — Progress Notes (Signed)
Subjective: CC: Mnire'Jade disease PCP: Raliegh Ip, DO FIE:PPIRJJ [name] is a 62 y.o. female presenting to clinic today for:  1.  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  She utilizes this intermittently and this does control her attacks.  She is functional.  Denies any memory changes, excessive daytime sedation, falls or respiratory depression  2.  Infected insect bites She continues to sustain insect bites which become very inflamed, erythematous, warm.  This did respond to Keflex and she needs another round of this.  ROS: Per HPI  Allergies  Allergen Reactions   Statins Other (See Comments)    Muscle pain   Lipitor [Atorvastatin] Other (See Comments)    myalgias   Temazepam Other (See Comments)    Pt does not remember (Restoril)   Past Medical History:  Diagnosis Date   Abnormal Pap smear of cervix 2002   Dr. Lexine Baton    Allergy 2012   seasonal   Anxiety    Arthritis    Benign hemangioma    Dr,  Guadelupe Sabin    Breast cancer [facility]) 07/20/2014   Left Breast   Cervical stenosis (uterine cervix) 2002   Depression    Dizziness 1991   secondary to Meniere'Jade disease   Hyperlipidemia 2007   Insomnia 1997   Meniere'Jade disease 1991   Nervousness(799.21) 1987    Current Outpatient Medications:    acetaminophen (TYLENOL) 500 MG tablet, Take 1,500 mg by mouth daily as needed for moderate pain., Disp: , Rfl:    albuterol (VENTOLIN HFA) 108 (90 Base) MCG/ACT inhaler, TAKE 2 PUFFS BY MOUTH EVERY 6 HOURS AS NEEDED FOR WHEEZE OR SHORTNESS OF BREATH, Disp: 8.5 each, Rfl: 6   alendronate (FOSAMAX) 70 MG tablet, TAKE 1 TABLET ONCE A WEEK. TAKE WITH A FULL GLASS OF WATER ON AN EMPTY STOMACH., Disp: 12 tablet, Rfl: 3   anastrozole (ARIMIDEX) 1 MG tablet, TAKE 1 TABLET BY MOUTH EVERY DAY, Disp: 90 tablet, Rfl: 3   Biotin 88416 MCG TABS, Take 10,000 mcg by mouth daily., Disp: , Rfl:    Calcium  Carb-Cholecalciferol (CALCIUM 600 + D PO), Take 1 tablet by mouth at bedtime., Disp: , Rfl:    clobetasol cream (TEMOVATE) 0.05 %, Apply 1 Application topically 2 (two) times daily as needed (irritation)., Disp: 30 g, Rfl: 0   [START ON 09/19/2022] clonazePAM (KLONOPIN) 0.5 MG tablet, 1 tablet 3 times daily as needed for meniere'Jade attack, Disp: 90 tablet, Rfl: 5   donepezil (ARICEPT) 10 MG tablet, Take 1 tablet (10 mg total) by mouth at bedtime., Disp: 31 tablet, Rfl: 12   furosemide (LASIX) 20 MG tablet, TAKE 1 TABLET BY MOUTH EVERY DAY, Disp: 30 tablet, Rfl: 3   hydrOXYzine (ATARAX) 25 MG tablet, Take 0.5-1 tablets (12.5-25 mg total) by mouth 3 (three) times daily as needed for itching., Disp: 30 tablet, Rfl: 0   meclizine (ANTIVERT) 25 MG tablet, Take 1 tablet (25 mg total) by mouth 3 (three) times daily as needed for dizziness., Disp: 30 tablet, Rfl: 1   PARoxetine (PAXIL-CR) 25 MG 24 hr tablet, Take 1 tablet (25 mg total) by mouth daily., Disp: 90 tablet, Rfl: 3   potassium chloride (KLOR-CON) 10 MEQ tablet, Take 1 tablet (10 mEq total) by mouth daily as needed (when taking lasix)., Disp: 90 tablet, Rfl: 3   rosuvastatin (CRESTOR) 10 MG tablet, Take 1 tablet (10 mg total) by mouth daily., Disp: 90 tablet, Rfl: 3  triamcinolone cream (KENALOG) 0.1 %, Apply 1 application topically 2 (two) times daily as needed (irritation)., Disp: , Rfl:    Turmeric 500 MG TABS, Take 1 tablet by mouth 2 (two) times daily., Disp: , Rfl:    vitamin B-12 (CYANOCOBALAMIN) 1000 MCG tablet, Take 1,000 mcg by mouth at bedtime., Disp: , Rfl:    Vitamin D, Ergocalciferol, (DRISDOL) 1.25 MG (50000 UNIT) CAPS capsule, TAKE 1 CAPSULE (50,000 UNITS TOTAL) BY MOUTH EVERY 7 (SEVEN) DAYS., Disp: 12 capsule, Rfl: 3 Social History   Socioeconomic History   Marital status: Married    Spouse name: Earl Lites   Number of children: 2   Years of education: Not on file   Highest education level: Not on file  Occupational History    Not on file  Tobacco Use   Smoking status: Never   Smokeless tobacco: Never  Vaping Use   Vaping Use: Never used  Substance and Sexual Activity   Alcohol use: Not Currently    Comment: very rare   Drug use: No   Sexual activity: Yes    Birth control/protection: Surgical    Comment: ablation  Other Topics Concern   Not on file  Social History Narrative   Not on file   Social Determinants of Health   Financial Resource Strain: Not on file  Food Insecurity: Not on file  Transportation Needs: Not on file  Physical Activity: Not on file  Stress: Not on file  Social Connections: Not on file  Intimate Partner Violence: Not on file   Family History  Problem Relation Age of Onset   Cirrhosis Father    Colon polyps Mother    Colon cancer Neg Hx    Esophageal cancer Neg Hx    Rectal cancer Neg Hx    Stomach cancer Neg Hx     Objective: Office vital signs reviewed. BP 124/66   Pulse 71   Temp 97.8 F (36.6 C) (Oral)   Resp 20   Ht 5\' 5"  (1.651 m)   Wt 167 lb 6 oz (75.9 kg)   SpO2 93%   BMI 27.85 kg/m   Physical Examination:  General: Awake, alert, well nourished, No acute distress HEENT: sclera white, MMM Cardio: regular rate and rhythm, S1S2 heard, no murmurs appreciated Pulm: clear to auscultation bilaterally, no wheezes, rhonchi or rales; normal work of breathing on room air Skin: Several inflamed and erythematous, warm insect bites noted along the right posterior lateral aspect of the arm.  No purulence or exudates appreciated Neuro: Alert and oriented.  Gait steady  Assessment/ Plan: 62 y.o. 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  UDS and CSC were  updated as per office policy.  National narcotic database reviewed and there were no red flags  Keflex renewed for infected insect bites.  She may follow-up as needed on this issue  Orders Placed This Encounter  Procedures   ToxASSURE Select 13 (MW), Urine    Current Outpatient Medications:    acetaminophen (TYLENOL) 500 MG tablet, Take 1,500 mg by mouth daily as needed for moderate pain., Disp: , Rfl:    albuterol (VENTOLIN HFA) 108 (90 Base) MCG/ACT inhaler, TAKE 2 PUFFS BY MOUTH EVERY 6 HOURS AS NEEDED  FOR WHEEZE OR SHORTNESS OF BREATH, Disp: 8.5 each, Rfl: 6   alendronate (FOSAMAX) 70 MG tablet, TAKE 1 TABLET ONCE A WEEK. TAKE WITH A FULL GLASS OF WATER ON AN EMPTY STOMACH., Disp: 12 tablet, Rfl: 3   anastrozole (ARIMIDEX) 1 MG tablet, TAKE 1 TABLET BY MOUTH EVERY DAY, Disp: 90 tablet, Rfl: 3   Biotin 62376 MCG TABS, Take 10,000 mcg by mouth daily., Disp: , Rfl:    Calcium Carb-Cholecalciferol (CALCIUM 600 + D PO), Take 1 tablet by mouth at bedtime., Disp: , Rfl:    clobetasol cream (TEMOVATE) 0.05 %, Apply 1 Application topically 2 (two) times daily as needed (irritation)., Disp: 30 g, Rfl: 0   clonazePAM (KLONOPIN) 0.5 MG tablet, 1 tablet 3 times daily as needed for meniere'Jade attack, Disp: 90 tablet, Rfl: 5   donepezil (ARICEPT) 10 MG tablet, Take 1 tablet (10 mg total) by mouth at bedtime., Disp: 31 tablet, Rfl: 12   furosemide (LASIX) 20 MG tablet, TAKE 1 TABLET BY MOUTH EVERY DAY, Disp: 30 tablet, Rfl: 3   hydrOXYzine (ATARAX) 25 MG tablet, Take 0.5-1 tablets (12.5-25 mg total) by mouth 3 (three) times daily as needed for itching., Disp: 30 tablet, Rfl: 0   meclizine (ANTIVERT) 25 MG tablet, Take 1 tablet (25 mg total) by mouth 3 (three) times daily as needed for dizziness., Disp: 30 tablet, Rfl: 1   PARoxetine (PAXIL-CR) 25 MG 24 hr tablet, Take 1 tablet (25 mg total) by mouth daily., Disp: 90 tablet, Rfl: 3   potassium chloride (KLOR-CON) 10 MEQ tablet, Take 1 tablet (10  mEq total) by mouth daily as needed (when taking lasix)., Disp: 90 tablet, Rfl: 3   rosuvastatin (CRESTOR) 10 MG tablet, Take 1 tablet (10 mg total) by mouth daily., Disp: 90 tablet, Rfl: 3   triamcinolone cream (KENALOG) 0.1 %, Apply 1 application topically 2 (two) times daily as needed (irritation)., Disp: , Rfl:    Turmeric 500 MG TABS, Take 1 tablet by mouth 2 (two) times daily., Disp: , Rfl:    vitamin B-12 (CYANOCOBALAMIN) 1000 MCG tablet, Take 1,000 mcg by mouth at bedtime., Disp: , Rfl:    Vitamin D, Ergocalciferol, (DRISDOL) 1.25 MG (50000 UNIT) CAPS capsule, TAKE 1 CAPSULE (50,000 UNITS TOTAL) BY MOUTH EVERY 7 (SEVEN) DAYS., Disp: 12 capsule, Rfl: 3   Meds ordered this encounter  Medications   clonazePAM (KLONOPIN) 0.5 MG tablet    Sig: 1 tablet 3 times daily as needed for meniere'Jade attack    Dispense:  90 tablet    Refill:  5   cephALEXin (KEFLEX) 500 MG capsule    Sig: Take 1 capsule (500 mg total) by mouth 2 (two) times daily as needed for up to 10 days (infected insect bites).    Dispense:  20 capsule    Refill:  0     Siyon Linck Hulen Skains, DO [facility] [phone]

## 2022-09-04 ENCOUNTER — Telehealth: Payer: Self-pay | Admitting: Hematology and Oncology

## 2022-09-04 NOTE — Telephone Encounter (Signed)
Discussed with the patient that the bone density came back as -1.9 (this is significantly improved from -2.5 that she had originally). Continue with the Fosamax and calcium and vitamin D.

## 2022-09-05 ENCOUNTER — Ambulatory Visit: Payer: PRIVATE HEALTH INSURANCE | Admitting: Family Medicine

## 2022-09-06 LAB — TOXASSURE SELECT 13 (MW), URINE

## 2022-09-18 ENCOUNTER — Encounter: Payer: Self-pay | Admitting: Hematology and Oncology

## 2022-11-13 ENCOUNTER — Other Ambulatory Visit: Payer: Self-pay | Admitting: Hematology and Oncology

## 2022-11-13 DIAGNOSIS — C50412 Malignant neoplasm of upper-outer quadrant of left female breast: Secondary | ICD-10-CM

## 2022-12-03 ENCOUNTER — Encounter: Payer: Self-pay | Admitting: Hematology and Oncology

## 2022-12-03 ENCOUNTER — Ambulatory Visit: Payer: 59 | Admitting: Family Medicine

## 2022-12-03 ENCOUNTER — Encounter: Payer: Self-pay | Admitting: Family Medicine

## 2022-12-03 VITALS — BP 138/82 | HR 71 | Temp 98.6°F | Resp 71 | Ht 65.0 in | Wt 161.0 lb

## 2022-12-03 DIAGNOSIS — E663 Overweight: Secondary | ICD-10-CM

## 2022-12-03 DIAGNOSIS — Z713 Dietary counseling and surveillance: Secondary | ICD-10-CM

## 2022-12-03 NOTE — Progress Notes (Signed)
Subjective: CC: Weight recheck PCP: Raliegh Ip, DO UUV:OZDGUY REIDA HOFLER is a 62 y.o. female presenting to clinic today for:  1.  Overweight Patient has been compliant with the compounded semaglutide.  She is up to 20 units weekly.  She seen a positive improvement in weight and is down about 9 pounds.  She reports no nausea, vomiting or abdominal pain.  She has Zofran on hand if needed however.   ROS: Per HPI  Allergies  Allergen Reactions   Statins Other (See Comments)    Muscle pain   Lipitor [Atorvastatin] Other (See Comments)    myalgias   Temazepam Other (See Comments)    Pt does not remember (Restoril)   Past Medical History:  Diagnosis Date   Abnormal Pap smear of cervix 2002   Dr. Lexine Baton    Allergy 2012   seasonal   Anxiety    Arthritis    Benign hemangioma    Dr,  Guadelupe Sabin    Breast cancer [facility]) 07/20/2014   Left Breast   Cervical stenosis (uterine cervix) 2002   Depression    Dizziness 1991   secondary to Meniere's disease   Hyperlipidemia 2007   Insomnia 1997   Meniere's disease 1991   Nervousness(799.21) 1987    Current Outpatient Medications:    acetaminophen (TYLENOL) 500 MG tablet, Take 1,500 mg by mouth daily as needed for moderate pain., Disp: , Rfl:    albuterol (VENTOLIN HFA) 108 (90 Base) MCG/ACT inhaler, TAKE 2 PUFFS BY MOUTH EVERY 6 HOURS AS NEEDED FOR WHEEZE OR SHORTNESS OF BREATH, Disp: 8.5 each, Rfl: 6   alendronate (FOSAMAX) 70 MG tablet, TAKE 1 TABLET ONCE A WEEK. TAKE WITH A FULL GLASS OF WATER ON AN EMPTY STOMACH., Disp: 12 tablet, Rfl: 3   anastrozole (ARIMIDEX) 1 MG tablet, TAKE 1 TABLET BY MOUTH EVERY DAY, Disp: 90 tablet, Rfl: 3   Biotin 40347 MCG TABS, Take 10,000 mcg by mouth daily., Disp: , Rfl:    Calcium Carb-Cholecalciferol (CALCIUM 600 + D PO), Take 1 tablet by mouth at bedtime., Disp: , Rfl:    clobetasol cream (TEMOVATE) 0.05 %, Apply 1 Application topically 2 (two) times daily as needed (irritation)., Disp: 30  g, Rfl: 0   clonazePAM (KLONOPIN) 0.5 MG tablet, 1 tablet 3 times daily as needed for meniere's attack, Disp: 90 tablet, Rfl: 5   donepezil (ARICEPT) 10 MG tablet, Take 1 tablet (10 mg total) by mouth at bedtime., Disp: 31 tablet, Rfl: 12   furosemide (LASIX) 20 MG tablet, TAKE 1 TABLET BY MOUTH EVERY DAY, Disp: 30 tablet, Rfl: 3   hydrOXYzine (ATARAX) 25 MG tablet, Take 0.5-1 tablets (12.5-25 mg total) by mouth 3 (three) times daily as needed for itching., Disp: 30 tablet, Rfl: 0   meclizine (ANTIVERT) 25 MG tablet, Take 1 tablet (25 mg total) by mouth 3 (three) times daily as needed for dizziness., Disp: 30 tablet, Rfl: 1   PARoxetine (PAXIL-CR) 25 MG 24 hr tablet, Take 1 tablet (25 mg total) by mouth daily., Disp: 90 tablet, Rfl: 3   potassium chloride (KLOR-CON) 10 MEQ tablet, Take 1 tablet (10 mEq total) by mouth daily as needed (when taking lasix)., Disp: 90 tablet, Rfl: 3   rosuvastatin (CRESTOR) 10 MG tablet, Take 1 tablet (10 mg total) by mouth daily., Disp: 90 tablet, Rfl: 3   triamcinolone cream (KENALOG) 0.1 %, Apply 1 application topically 2 (two) times daily as needed (irritation)., Disp: , Rfl:    Turmeric 500  MG TABS, Take 1 tablet by mouth 2 (two) times daily., Disp: , Rfl:    vitamin B-12 (CYANOCOBALAMIN) 1000 MCG tablet, Take 1,000 mcg by mouth at bedtime., Disp: , Rfl:    Vitamin D, Ergocalciferol, (DRISDOL) 1.25 MG (50000 UNIT) CAPS capsule, TAKE 1 CAPSULE (50,000 UNITS TOTAL) BY MOUTH EVERY 7 (SEVEN) DAYS., Disp: 12 capsule, Rfl: 3 Social History   Socioeconomic History   Marital status: Married    Spouse name: Earl Lites   Number of children: 2   Years of education: Not on file   Highest education level: Not on file  Occupational History   Not on file  Tobacco Use   Smoking status: Never   Smokeless tobacco: Never  Vaping Use   Vaping status: Never Used  Substance and Sexual Activity   Alcohol use: Not Currently    Comment: very rare   Drug use: No   Sexual  activity: Yes    Birth control/protection: Surgical    Comment: ablation  Other Topics Concern   Not on file  Social History Narrative   Not on file   Social Determinants of Health   Financial Resource Strain: Not on file  Food Insecurity: Not on file  Transportation Needs: Not on file  Physical Activity: Not on file  Stress: Not on file  Social Connections: Not on file  Intimate Partner Violence: Not on file   Family History  Problem Relation Age of Onset   Cirrhosis Father    Colon polyps Mother    Colon cancer Neg Hx    Esophageal cancer Neg Hx    Rectal cancer Neg Hx    Stomach cancer Neg Hx     Objective: Office vital signs reviewed. BP 138/82   Pulse 71   Temp 98.6 F (37 C)   Resp (!) 71   Ht 5\' 5"  (1.651 m)   Wt 161 lb (73 kg)   BMI 26.79 kg/m   Physical Examination:  General: Awake, alert, well nourished, No acute distress HEENT: sclera white, MMM Cardio: regular rate and rhythm, S1S2 heard, no murmurs appreciated Pulm: clear to auscultation bilaterally, no wheezes, rhonchi or rales; normal work of breathing on room air    Assessment/ Plan: 62 y.o. female   Overweight (BMI 25.0-29.9)  Weight loss counseling, encounter for  She is having a positive response with the compounded GLP.  No apparent side effects or new contraindications.  Continue current regimen.  I have written a new prescription to advance up to 2.4 mg weekly as tolerated.  Follow-up in 3 to 6 months, sooner if concerns arise   Raliegh Ip, DO [facility] [phone]

## 2023-01-21 ENCOUNTER — Inpatient Hospital Stay (HOSPITAL_BASED_OUTPATIENT_CLINIC_OR_DEPARTMENT_OTHER): Payer: BC Managed Care – PPO | Admitting: Hematology and Oncology

## 2023-01-21 ENCOUNTER — Encounter: Payer: Self-pay | Admitting: Hematology and Oncology

## 2023-01-21 ENCOUNTER — Inpatient Hospital Stay: Payer: BC Managed Care – PPO | Attending: Hematology and Oncology

## 2023-01-21 VITALS — BP 120/82 | HR 78 | Temp 97.7°F | Resp 18 | Ht 65.0 in | Wt 161.4 lb

## 2023-01-21 DIAGNOSIS — Z923 Personal history of irradiation: Secondary | ICD-10-CM | POA: Insufficient documentation

## 2023-01-21 DIAGNOSIS — Z888 Allergy status to other drugs, medicaments and biological substances status: Secondary | ICD-10-CM | POA: Insufficient documentation

## 2023-01-21 DIAGNOSIS — R634 Abnormal weight loss: Secondary | ICD-10-CM | POA: Insufficient documentation

## 2023-01-21 DIAGNOSIS — R4189 Other symptoms and signs involving cognitive functions and awareness: Secondary | ICD-10-CM | POA: Insufficient documentation

## 2023-01-21 DIAGNOSIS — Z170 Estrogen receptor positive status [ER+]: Secondary | ICD-10-CM | POA: Diagnosis not present

## 2023-01-21 DIAGNOSIS — Z79899 Other long term (current) drug therapy: Secondary | ICD-10-CM | POA: Insufficient documentation

## 2023-01-21 DIAGNOSIS — C50412 Malignant neoplasm of upper-outer quadrant of left female breast: Secondary | ICD-10-CM | POA: Insufficient documentation

## 2023-01-21 DIAGNOSIS — I890 Lymphedema, not elsewhere classified: Secondary | ICD-10-CM | POA: Diagnosis not present

## 2023-01-21 DIAGNOSIS — Z7983 Long term (current) use of bisphosphonates: Secondary | ICD-10-CM | POA: Diagnosis not present

## 2023-01-21 DIAGNOSIS — M2550 Pain in unspecified joint: Secondary | ICD-10-CM | POA: Insufficient documentation

## 2023-01-21 DIAGNOSIS — Z1721 Progesterone receptor positive status: Secondary | ICD-10-CM | POA: Diagnosis not present

## 2023-01-21 DIAGNOSIS — Z79811 Long term (current) use of aromatase inhibitors: Secondary | ICD-10-CM | POA: Diagnosis not present

## 2023-01-21 DIAGNOSIS — M791 Myalgia, unspecified site: Secondary | ICD-10-CM | POA: Insufficient documentation

## 2023-01-21 LAB — CMP (CANCER CENTER ONLY)
ALT: 19 U/L (ref 0–44)
AST: 16 U/L (ref 15–41)
Albumin: 4.4 g/dL (ref 3.5–5.0)
Alkaline Phosphatase: 50 U/L (ref 38–126)
Anion gap: 6 (ref 5–15)
BUN: 10 mg/dL (ref 6–20)
CO2: 30 mmol/L (ref 22–32)
Calcium: 8.9 mg/dL (ref 8.9–10.3)
Chloride: 106 mmol/L (ref 98–111)
Creatinine: 0.53 mg/dL (ref 0.44–1.00)
GFR, Estimated: >60 mL/min (ref >60)
Glucose, Bld: 102 mg/dL — ABNORMAL HIGH (ref 70–99)
Potassium: 3.8 mmol/L (ref 3.5–5.1)
Sodium: 142 mmol/L (ref 135–145)
Total Bilirubin: 0.8 mg/dL (ref <1.2)
Total Protein: 6.7 g/dL (ref 6.5–8.1)

## 2023-01-21 LAB — CBC WITH DIFFERENTIAL (CANCER CENTER ONLY)
Abs Immature Granulocytes: 0.01 10*3/uL (ref 0.00–0.07)
Basophils Absolute: 0 10*3/uL (ref 0.0–0.1)
Basophils Relative: 1 %
Eosinophils Absolute: 0.1 10*3/uL (ref 0.0–0.5)
Eosinophils Relative: 1 %
HCT: 41.7 % (ref 36.0–46.0)
Hemoglobin: 14 g/dL (ref 12.0–15.0)
Immature Granulocytes: 0 %
Lymphocytes Relative: 42 %
Lymphs Abs: 2.2 10*3/uL (ref 0.7–4.0)
MCH: 31.9 pg (ref 26.0–34.0)
MCHC: 33.6 g/dL (ref 30.0–36.0)
MCV: 95 fL (ref 80.0–100.0)
Monocytes Absolute: 0.4 10*3/uL (ref 0.1–1.0)
Monocytes Relative: 8 %
Neutro Abs: 2.5 10*3/uL (ref 1.7–7.7)
Neutrophils Relative %: 48 %
Platelet Count: 185 10*3/uL (ref 150–400)
RBC: 4.39 MIL/uL (ref 3.87–5.11)
RDW: 12.4 % (ref 11.5–15.5)
WBC Count: 5.2 10*3/uL (ref 4.0–10.5)
nRBC: 0 % (ref 0.0–0.2)

## 2023-01-21 NOTE — Assessment & Plan Note (Addendum)
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 Adjuvant chemotherapy with dose dense Adriamycin and Cytoxan x 4 followed by Abraxane weekly 12 because of lymph node positive disease. Started 09/04/14 to 01/15/15 2. adjuvant radiation completed 03/20/15 3. Followed by antiestrogen therapy started 04/03/15 -------------------------------------------------------------------------------------------------------------------------- Anastrozole toxicities: Patient denies any hot flashes. Weight gain Arthralgias and myalgias    PALLAS Trial : Patient has been randomized to anastrozole alone. ABC clinical trial: The trial randomizes between aspirin versus placebo. She went on trial 07/26/2015 Currently on 100 mg of dosage.  No adverse effects from participating in the trial.   Adverse effects: 1. Bruising/ Hematoma:  Currently taking 100 mg of aspirin/placebo: Mild 2. Osteoporosis: Fosamax weekly, DEXA scan 07/28/2019: T score -2.4    Cognitive dysfunction: On Aricept 10 mg daily, followed by PCP, doing much better Profound lymphedema:    Breast cancer surveillance:  1. Mammogram 08/06/2021 benign 2. Breast exam 01/21/2023: No palpable lumps or nodules of concern.    Bone density 07/26/2015: T score -1.9, osteopenia Bone density: 07/28/19 T score -2.1 currently on Fosamax   No clinical evidence of breast cancer recurrence.   03/08/2021: PET CT scan: 9 mm posterior left lower lobe nodule 04/01/2021: Left lower lobe wedge resection (Dr. 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   Return to clinic in 6 months with labs, scans and follow-up

## 2023-01-21 NOTE — Progress Notes (Signed)
Patient Care Team: Raliegh Ip, DO as PCP - General (Family Medicine) Serena Croissant, MD as Consulting Physician (Hematology and Oncology) Abigail Miyamoto, MD as Consulting Physician (General Surgery) Dorothy Puffer, MD as Consulting Physician (Radiation Oncology) Salomon Fick, NP as Nurse Practitioner (Hematology and Oncology)  DIAGNOSIS:  Encounter Diagnosis  Name Primary?   Malignant neoplasm of upper-outer quadrant of left breast in female, estrogen receptor positive (HCC) Yes    SUMMARY OF ONCOLOGIC HISTORY: Oncology History  Malignant neoplasm of upper-outer quadrant of left breast in female, estrogen receptor positive (HCC)  07/18/2014 Mammogram   Distortion left breast, breast density category C; U/S 1.3 x 0.8 x 0.8 cm left breast mass at 2:00 position 4 cm from nipple, no lymph nodes   07/20/2014 Initial Diagnosis   Left breast Biopsy: Invasive lobular cancer with LCIS, Grade 1, ER 86%, PR 78%, Her 2 Neg Ratio 1.77, Ki 67: 14%   08/01/2014 Breast MRI   Breast MRI showed non-mass enhancement 3.9 cm, no lymph nodes   08/01/2014 Clinical Stage   Stage IIA: T2 N0   08/08/2014 Surgery   Left breast lumpectomy: 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, with LCIS, medial and inferior margins positive, 3/4 lymph nodes positive   08/08/2014 Pathologic Stage   Stage IIB: T2 N1c M0   08/16/2014 Surgery   Left breast medial margin reexcision residual ILC 0.2 cm; inferior margin residual foci less than 0.2 cm, 1/5 lymph nodes positive, 1 lymph node with isolated tumor cells (Overall 4/10)   09/04/2014 - 01/15/2015 Chemotherapy   Adjuvant chemotherapy with dose dense Adriamycin and Cytoxan 4 followed by Abraxane weekly 8 ( stopped early for profound neutropenia and thrombocytopenia)   01/29/2015 - 03/20/2015 Radiation Therapy   Adjuvant XRT [facility]): 50.4 Gy over 28 fractions; seroma boost: 10 Gy over 5 fractions. Total dose: 60.4 Gy   04/11/2015 -  Anti-estrogen oral  therapy   Anastrozole 1 mg daily (PALLAS clinical trial 05/24/2015 patient was randomized to hormone therapy alone)   05/24/2015 Survivorship   Survivorship visit completed and copy of care plan given to patient   11/26/2016 PET scan   Small Left sided level 2 LN with hypermetabolism, post surg changes in breast and small right groin lymph node likely infectious/inflammatory, gallstones     CHIEF COMPLIANT: Follow-up on anastrozole therapy  HISTORY OF PRESENT ILLNESS:   History of Present Illness   The patient, with a past medical history of breast cancer, lymphedema, and osteopenia, presents for a routine follow-up. She reports significant lifestyle changes, including leaving a stressful job and starting a new one with less stress and more work-life balance. The patient has also embarked on a weight loss journey, losing 10 pounds with the help of a medication. She reports a significant improvement in her lymphedema symptoms since starting the medication, to the point where she no longer needs to use compression garments. The patient expresses concern about a previous lung issue and the need for a follow-up CT scan.         ALLERGIES:  is allergic to statins, lipitor [atorvastatin], and temazepam.  MEDICATIONS:  Current Outpatient Medications  Medication Sig Dispense Refill   acetaminophen (TYLENOL) 500 MG tablet Take 1,500 mg by mouth daily as needed for moderate pain.     albuterol (VENTOLIN HFA) 108 (90 Base) MCG/ACT inhaler TAKE 2 PUFFS BY MOUTH EVERY 6 HOURS AS NEEDED FOR WHEEZE OR SHORTNESS OF BREATH 8.5 each 6   alendronate (FOSAMAX) 70 MG  tablet TAKE 1 TABLET ONCE A WEEK. TAKE WITH A FULL GLASS OF WATER ON AN EMPTY STOMACH. 12 tablet 3   anastrozole (ARIMIDEX) 1 MG tablet TAKE 1 TABLET BY MOUTH EVERY DAY 90 tablet 3   Biotin 13086 MCG TABS Take 10,000 mcg by mouth daily.     Calcium Carb-Cholecalciferol (CALCIUM 600 + D PO) Take 1 tablet by mouth at bedtime.     clobetasol cream  (TEMOVATE) 0.05 % Apply 1 Application topically 2 (two) times daily as needed (irritation). 30 g 0   clonazePAM (KLONOPIN) 0.5 MG tablet 1 tablet 3 times daily as needed for meniere's attack 90 tablet 5   donepezil (ARICEPT) 10 MG tablet Take 1 tablet (10 mg total) by mouth at bedtime. 31 tablet 12   furosemide (LASIX) 20 MG tablet TAKE 1 TABLET BY MOUTH EVERY DAY 30 tablet 3   hydrOXYzine (ATARAX) 25 MG tablet Take 0.5-1 tablets (12.5-25 mg total) by mouth 3 (three) times daily as needed for itching. 30 tablet 0   meclizine (ANTIVERT) 25 MG tablet Take 1 tablet (25 mg total) by mouth 3 (three) times daily as needed for dizziness. 30 tablet 1   PARoxetine (PAXIL-CR) 25 MG 24 hr tablet Take 1 tablet (25 mg total) by mouth daily. 90 tablet 3   potassium chloride (KLOR-CON) 10 MEQ tablet Take 1 tablet (10 mEq total) by mouth daily as needed (when taking lasix). 90 tablet 3   rosuvastatin (CRESTOR) 10 MG tablet Take 1 tablet (10 mg total) by mouth daily. 90 tablet 3   Semaglutide, 1 MG/DOSE, 2 MG/1.5ML SOPN Inject into the skin. [facility] compounded     triamcinolone cream (KENALOG) 0.1 % Apply 1 application topically 2 (two) times daily as needed (irritation).     Turmeric 500 MG TABS Take 1 tablet by mouth 2 (two) times daily.     vitamin B-12 (CYANOCOBALAMIN) 1000 MCG tablet Take 1,000 mcg by mouth at bedtime.     Vitamin D, Ergocalciferol, (DRISDOL) 1.25 MG (50000 UNIT) CAPS capsule TAKE 1 CAPSULE (50,000 UNITS TOTAL) BY MOUTH EVERY 7 (SEVEN) DAYS. 12 capsule 3   No current facility-administered medications for this visit.    PHYSICAL EXAMINATION: ECOG PERFORMANCE STATUS: 1 - Symptomatic but completely ambulatory  Vitals:   01/21/23 0817  BP: 120/82  Pulse: 78  Resp: 18  Temp: 97.7 F (36.5 C)  SpO2: 100%   Filed Weights   01/21/23 0817  Weight: 161 lb 6.4 oz (73.2 kg)    Physical Exam surgical scars but otherwise no palpable lumps or nodules.  Lymphedema has resolved.         (exam performed in the presence of a chaperone)  LABORATORY DATA:  I have reviewed the data as listed    Latest Ref Rng & Units 01/21/2023    7:48 AM 07/16/2022    1:19 PM 07/16/2022   12:56 PM  CMP  Glucose 70 - 99 mg/dL 578   90   BUN 6 - 20 mg/dL 10   11   Creatinine 4.69 - 1.00 mg/dL 6.29  5.28  4.13   Sodium 135 - 145 mmol/L 142   140   Potassium 3.5 - 5.1 mmol/L 3.8   3.9   Chloride 98 - 111 mmol/L 106   105   CO2 22 - 32 mmol/L 30   21   Calcium 8.9 - 10.3 mg/dL 8.9   9.0   Total Protein 6.5 - 8.1 g/dL 6.7   7.2  Total Bilirubin <1.2 mg/dL 0.8   0.8   Alkaline Phos 38 - 126 U/L 50   52   AST 15 - 41 U/L 16   27   ALT 0 - 44 U/L 19   26     Lab Results  Component Value Date   WBC 5.2 01/21/2023   HGB 14.0 01/21/2023   HCT 41.7 01/21/2023   MCV 95.0 01/21/2023   PLT 185 01/21/2023   NEUTROABS 2.5 01/21/2023    ASSESSMENT & PLAN:  Malignant neoplasm of upper-outer quadrant of left breast in female, estrogen receptor positive (HCC) Left breast lumpectomy 08/08/2014: 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 Adjuvant chemotherapy with dose dense Adriamycin and Cytoxan x 4 followed by Abraxane weekly 12 because of lymph node positive disease. Started 09/04/14 to 01/15/15 2. adjuvant radiation completed 03/20/15 3. Followed by antiestrogen therapy started 04/03/15 -------------------------------------------------------------------------------------------------------------------------- Anastrozole toxicities: Patient denies any hot flashes. Weight gain Arthralgias and myalgias    PALLAS Trial : Patient has  been randomized to anastrozole alone. ABC clinical trial: The trial randomizes between aspirin versus placebo. She went on trial 07/26/2015 Currently on 100 mg of dosage.  No adverse effects from participating in the trial.   Adverse effects: 1. Bruising/ Hematoma:  Currently taking 100 mg of aspirin/placebo: Mild 2. Osteoporosis: Fosamax weekly, DEXA scan 07/28/2019: T score -2.4    Cognitive dysfunction: On Aricept 10 mg daily, followed by PCP, doing much better Profound lymphedema:    Breast cancer surveillance:  1. Mammogram 08/06/2021 benign 2. Breast exam 01/21/2023: No palpable lumps or nodules of concern.    Bone density 07/26/2015: T score -1.9, osteopenia Bone density: 07/28/19 T score -2.1 currently on Fosamax   No clinical evidence of breast cancer recurrence.   03/08/2021: PET CT scan: 9 mm posterior left lower lobe nodule 04/01/2021: Left lower lobe wedge resection (Dr. 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       Breast Cancer Survivorship Stable on Anastrozole for 7 years, with a plan to continue for a total of 10 years. No concerns about recurrence. -Continue Anastrozole.  Osteopenia Slight worsening of bone density, but still within osteopenia range. Currently on Fosamax. -Continue Fosamax. -Encourage weight-bearing exercise such as walking.  Lymphedema Significant improvement reported with use of an unspecified medication, leading to near resolution of symptoms. -Continue current medication.  Lung Nodule History of lung nodule, with a surgical scar noted on previous imaging. Patient reports occasional discomfort in the area. -Schedule annual CT scan for May  2025.  Weight Management Patient reports a 10-pound weight loss, possibly related to use of an unspecified medication. Patient is also making lifestyle changes, including increased physical activity. -Encourage continuation of healthy lifestyle changes and medication.  General Health Maintenance -Flu and COVID vaccinations are up to date.      Return to clinic in 6 months for follow-up  after scans    Orders Placed This Encounter  Procedures   CT CHEST ABDOMEN PELVIS W CONTRAST    Standing Status:   Future    Standing Expiration Date:   01/21/2024    Order Specific Question:   If indicated for the ordered procedure, I authorize the administration of contrast media per Radiology protocol    Answer:   Yes    Order Specific Question:   Does the patient have a contrast media/X-ray dye allergy?    Answer:   No    Order Specific Question:   Is patient pregnant?    Answer:   No    Order Specific Question:   Preferred imaging location?    Answer:   [facility]    Order Specific Question:   Release to patient    Answer:   Immediate    Order Specific Question:   If indicated for the ordered procedure, I authorize the administration of oral contrast media per Radiology protocol    Answer:   Yes   The patient has a good understanding of the overall plan. she agrees with it. she will call with any problems that may develop before the next visit here. Total time spent: 30 mins including face to face time and time spent for planning, charting and co-ordination of care   Tamsen Meek, MD 01/21/23

## 2023-01-22 ENCOUNTER — Encounter: Payer: Self-pay | Admitting: Hematology and Oncology

## 2023-02-24 ENCOUNTER — Ambulatory Visit: Payer: PRIVATE HEALTH INSURANCE | Admitting: Family Medicine

## 2023-02-27 ENCOUNTER — Encounter: Payer: Self-pay | Admitting: Hematology and Oncology

## 2023-03-06 ENCOUNTER — Encounter: Payer: Self-pay | Admitting: Family Medicine

## 2023-03-06 ENCOUNTER — Ambulatory Visit (INDEPENDENT_AMBULATORY_CARE_PROVIDER_SITE_OTHER): Payer: BC Managed Care – PPO | Admitting: Family Medicine

## 2023-03-06 ENCOUNTER — Ambulatory Visit: Payer: PRIVATE HEALTH INSURANCE

## 2023-03-06 ENCOUNTER — Encounter: Payer: Self-pay | Admitting: Hematology and Oncology

## 2023-03-06 VITALS — BP 138/82 | HR 80 | Temp 98.3°F | Ht 65.0 in | Wt 159.6 lb

## 2023-03-06 DIAGNOSIS — E559 Vitamin D deficiency, unspecified: Secondary | ICD-10-CM

## 2023-03-06 DIAGNOSIS — E663 Overweight: Secondary | ICD-10-CM

## 2023-03-06 DIAGNOSIS — Z713 Dietary counseling and surveillance: Secondary | ICD-10-CM

## 2023-03-06 DIAGNOSIS — M818 Other osteoporosis without current pathological fracture: Secondary | ICD-10-CM | POA: Diagnosis not present

## 2023-03-06 DIAGNOSIS — H8101 Meniere's disease, right ear: Secondary | ICD-10-CM

## 2023-03-06 MED ORDER — ROSUVASTATIN CALCIUM 10 MG PO TABS: 10.0000 mg | ORAL_TABLET | Freq: Every day | ORAL | 3 refills | Status: DC

## 2023-03-06 MED ORDER — CLONAZEPAM 0.5 MG PO TABS
ORAL_TABLET | ORAL | 5 refills | Status: DC
Start: 2023-03-30 — End: 2023-09-04

## 2023-03-06 MED ORDER — VITAMIN D (ERGOCALCIFEROL) 1.25 MG (50000 UNIT) PO CAPS: ORAL_CAPSULE | ORAL | 3 refills | Status: DC

## 2023-03-06 MED ORDER — DONEPEZIL HCL 10 MG PO TABS: 10.0000 mg | ORAL_TABLET | Freq: Every day | ORAL | 4 refills | Status: DC

## 2023-03-06 MED ORDER — ALENDRONATE SODIUM 70 MG PO TABS
ORAL_TABLET | ORAL | 3 refills | Status: DC
Start: 2023-03-06 — End: 2023-09-04

## 2023-03-06 MED ORDER — PAROXETINE HCL ER 25 MG PO TB24: 25.0000 mg | ORAL_TABLET | Freq: Every day | ORAL | 3 refills | Status: DC

## 2023-03-06 NOTE — Progress Notes (Signed)
Subjective: CC: Weight recheck PCP: Raliegh Ip, DO WNU:[id number] [name] is a 62 y.o. female presenting to clinic today for:  1.  Overweight BMI associate with hyperlipidemia She was started on semaglutide back in September.  She is up to 1 mg weekly and has been on this about a month.  She is only lost a couple of pounds in his little disappointed that she is not losing as much as she had expected by this point.  She continues to eat healthy meals that are low-carb.  She continues to stay physically active but did change her job such that she is not as active at work as she had been.  She does not reporting nausea, vomiting, abdominal pain or diarrhea.  Suffers from chronic constipation but this is unchanged from baseline.  She does report improvement in her lymphatic system with this medication.  This surprised her.  2.  Mnire's She reports stability with use of benzodiazepine.  No memory changes from baseline.  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  No falls.   ROS: Per HPI  Allergies  Allergen Reactions   Statins Other (See Comments)    Muscle pain   Lipitor [Atorvastatin] Other (See Comments)    myalgias   Temazepam Other (See Comments)    Pt does not remember (Restoril)   Past Medical History:  Diagnosis Date   Abnormal Pap smear of cervix 2002   Dr. Lexine Baton    Allergy 2012   seasonal   Anxiety    Arthritis    Benign hemangioma    Dr,  Guadelupe Sabin    Breast cancer [facility]) 07/20/2014   Left Breast   Cervical stenosis (uterine cervix) 2002   Depression    Dizziness 1991   secondary to Meniere's disease   Hyperlipidemia 2007   Insomnia 1997   Meniere's disease 1991   Nervousness(799.21) 1987    Current Outpatient Medications:    acetaminophen (TYLENOL) 500 MG tablet, Take 1,500 mg by mouth daily as needed for moderate pain., Disp: , Rfl:    albuterol (VENTOLIN HFA) 108 (90 Base) MCG/ACT inhaler, TAKE 2 PUFFS BY MOUTH EVERY 6 HOURS AS NEEDED FOR  WHEEZE OR SHORTNESS OF BREATH, Disp: 8.5 each, Rfl: 6   alendronate (FOSAMAX) 70 MG tablet, TAKE 1 TABLET ONCE A WEEK. TAKE WITH A FULL GLASS OF WATER ON AN EMPTY STOMACH., Disp: 12 tablet, Rfl: 3   anastrozole (ARIMIDEX) 1 MG tablet, TAKE 1 TABLET BY MOUTH EVERY DAY, Disp: 90 tablet, Rfl: 3   Biotin 64403 MCG TABS, Take 10,000 mcg by mouth daily., Disp: , Rfl:    Calcium Carb-Cholecalciferol (CALCIUM 600 + D PO), Take 1 tablet by mouth at bedtime., Disp: , Rfl:    clobetasol cream (TEMOVATE) 0.05 %, Apply 1 Application topically 2 (two) times daily as needed (irritation)., Disp: 30 g, Rfl: 0   clonazePAM (KLONOPIN) 0.5 MG tablet, 1 tablet 3 times daily as needed for meniere's attack, Disp: 90 tablet, Rfl: 5   donepezil (ARICEPT) 10 MG tablet, Take 1 tablet (10 mg total) by mouth at bedtime., Disp: 31 tablet, Rfl: 12   furosemide (LASIX) 20 MG tablet, TAKE 1 TABLET BY MOUTH EVERY DAY, Disp: 30 tablet, Rfl: 3   hydrOXYzine (ATARAX) 25 MG tablet, Take 0.5-1 tablets (12.5-25 mg total) by mouth 3 (three) times daily as needed for itching., Disp: 30 tablet, Rfl: 0   meclizine (ANTIVERT) 25 MG tablet, Take 1 tablet (25 mg total) by mouth 3 (three)  times daily as needed for dizziness., Disp: 30 tablet, Rfl: 1   PARoxetine (PAXIL-CR) 25 MG 24 hr tablet, Take 1 tablet (25 mg total) by mouth daily., Disp: 90 tablet, Rfl: 3   potassium chloride (KLOR-CON) 10 MEQ tablet, Take 1 tablet (10 mEq total) by mouth daily as needed (when taking lasix)., Disp: 90 tablet, Rfl: 3   rosuvastatin (CRESTOR) 10 MG tablet, Take 1 tablet (10 mg total) by mouth daily., Disp: 90 tablet, Rfl: 3   Semaglutide, 1 MG/DOSE, 2 MG/1.5ML SOPN, Inject into the skin. [facility] compounded, Disp: , Rfl:    triamcinolone cream (KENALOG) 0.1 %, Apply 1 application topically 2 (two) times daily as needed (irritation)., Disp: , Rfl:    Turmeric 500 MG TABS, Take 1 tablet by mouth 2 (two) times daily., Disp: , Rfl:    vitamin B-12  (CYANOCOBALAMIN) 1000 MCG tablet, Take 1,000 mcg by mouth at bedtime., Disp: , Rfl:    Vitamin D, Ergocalciferol, (DRISDOL) 1.25 MG (50000 UNIT) CAPS capsule, TAKE 1 CAPSULE (50,000 UNITS TOTAL) BY MOUTH EVERY 7 (SEVEN) DAYS., Disp: 12 capsule, Rfl: 3 Social History   Socioeconomic History   Marital status: Married    Spouse name: Earl Lites   Number of children: 2   Years of education: Not on file   Highest education level: Not on file  Occupational History   Not on file  Tobacco Use   Smoking status: Never   Smokeless tobacco: Never  Vaping Use   Vaping status: Never Used  Substance and Sexual Activity   Alcohol use: Not Currently    Comment: very rare   Drug use: No   Sexual activity: Yes    Birth control/protection: Surgical    Comment: ablation  Other Topics Concern   Not on file  Social History Narrative   Not on file   Social Drivers of Health   Financial Resource Strain: Not on file  Food Insecurity: Not on file  Transportation Needs: Not on file  Physical Activity: Not on file  Stress: Not on file  Social Connections: Not on file  Intimate Partner Violence: Not on file   Family History  Problem Relation Age of Onset   Cirrhosis Father    Colon polyps Mother    Colon cancer Neg Hx    Esophageal cancer Neg Hx    Rectal cancer Neg Hx    Stomach cancer Neg Hx     Objective: Office vital signs reviewed. BP 138/82   Pulse 80   Temp 98.3 F (36.8 C)   Ht 5\' 5"  (1.651 m)   Wt 159 lb 9.6 oz (72.4 kg)   SpO2 96%   BMI 26.56 kg/m   Physical Examination:  General: Awake, alert, well nourished, No acute distress HEENT: sclera white, MMM.  Slight effusion noted behind TMs but this is clear and with no bulging or erythema Cardio: regular rate and rhythm, S1S2 heard, no murmurs appreciated Pulm: clear to auscultation bilaterally, no wheezes, rhonchi or rales; normal work of breathing on room air    Assessment/ Plan: 62 y.o. female   Overweight (BMI  25.0-29.9)  Weight loss counseling, encounter for  Meniere's disease of right ear - Plan: clonazePAM (KLONOPIN) 0.5 MG tablet  Other osteoporosis without current pathological fracture - Plan: alendronate (FOSAMAX) 70 MG tablet  Vitamin D deficiency - Plan: Vitamin D, Ergocalciferol, (DRISDOL) 1.25 MG (50000 UNIT) CAPS capsule  She is only lost a couple of pounds on the GLP but I encouraged  her to stick with it.  She may advance to 1.7 mg weekly and then up to 2.4 mg the following month if needed.  Rx has been faxed to [facility].  May follow-up with me in 6 months, sooner if concerns arise  Klonopin renewed.  UDS and CSA are up-to-date.  National narcotic database reviewed and there were no red flags.  Mnire's is stable  Did not discuss vitamin D deficiency and osteoporosis but needed refill sent Rx's have been sent.  Plan for labs at next visit which will be her physical   Raliegh Ip, DO [facility] [phone]

## 2023-03-19 ENCOUNTER — Other Ambulatory Visit: Payer: Self-pay | Admitting: Emergency Medicine

## 2023-04-15 ENCOUNTER — Ambulatory Visit: Payer: Self-pay | Admitting: Family Medicine

## 2023-04-15 ENCOUNTER — Telehealth: Payer: Self-pay

## 2023-04-15 NOTE — Telephone Encounter (Signed)
 Copied from CRM [id number]. Topic: Clinical - Medical Advice >> Apr 15, 2023 10:59 AM [name] wrote: Reason for CRM: Spoke with triage this morning and is expecting call back from office [phone]

## 2023-04-15 NOTE — Telephone Encounter (Signed)
 Chief Complaint: cough Symptoms: H/A, body aches, nasal drainage, ear ache Frequency: x 2 days Pertinent Negatives: Patient denies fever, chest pain, SOB Disposition: [] ED /[] Urgent Care (no appt availability in office) / [] Appointment(In office/virtual)/ []  [facility]/ [] Home Care/ [x] Refused Recommended Disposition /[] [facility]/ [x]  Follow-up with PCP Additional Notes: Patient c/o coughing, H.A, body aches, nasal drainage x 2 days. Denies fevers, chest pain, SOB. Reports that she has also been having ear aches. Triager reinforced that she should see a provider within the day, but pt insisted that triager contact PCP for accommodation as she has fear of going to doctors. Triager relayed that message will be forwarded to PCP office for review. Pt expecting call back to see if accommodation can be made. Triager reinforced that if office does not call back with appt, to go to UC to be evaluated. Patient verbalized understanding and to call back with worsening symptoms.    Copied from CRM [id number]. Topic: Clinical - Red Word Triage >> Apr 15, 2023  9:41 AM Alfonso ORN wrote: Red Word that prompted transfer to Nurse Triage: extreme pain headache along with other symptoms  bad cough , bad headache rate pain 7 running nose ,body aches  just have concerns   patient had part of her lung taking out some time ago Reason for Disposition  Earache is present  Answer Assessment - Initial Assessment Questions 1. ONSET: When did the cough begin?      X2 day 2. SEVERITY: How bad is the cough today?      Pretty bad 3. SPUTUM: Describe the color of your sputum (none, dry cough; clear, white, yellow, green)     yellow 4. HEMOPTYSIS: Are you coughing up any blood? If so ask: How much? (flecks, streaks, tablespoons, etc.)     no 5. DIFFICULTY BREATHING: Are you having difficulty breathing? If Yes, ask: How bad is it? (e.g., mild, moderate, severe)    - MILD: No SOB at rest,  mild SOB with walking, speaks normally in sentences, can lie down, no retractions, pulse < 100.    - MODERATE: SOB at rest, SOB with minimal exertion and prefers to sit, cannot lie down flat, speaks in phrases, mild retractions, audible wheezing, pulse 100-120.    - SEVERE: Very SOB at rest, speaks in single words, struggling to breathe, sitting hunched forward, retractions, pulse > 120      no 6. FEVER: Do you have a fever? If Yes, ask: What is your temperature, how was it measured, and when did it start?     no 7. CARDIAC HISTORY: Do you have any history of heart disease? (e.g., heart attack, congestive heart failure)      no 8. LUNG HISTORY: Do you have any history of lung disease?  (e.g., pulmonary embolus, asthma, emphysema)     Lung nodule r/t cancer 9. PE RISK FACTORS: Do you have a history of blood clots? (or: recent major surgery, recent prolonged travel, bedridden)     no 10. OTHER SYMPTOMS: Do you have any other symptoms? (e.g., runny nose, wheezing, chest pain)       Runny nose  Answer Assessment - Initial Assessment Questions 1. LOCATION: Where does it hurt?      everywhere 2. ONSET: When did the headache start? (Minutes, hours or days)      X 2 days 3. PATTERN: Does the pain come and go, or has it been constant since it started?     Pretty constant, improves  with tylenol  4. SEVERITY: How bad is the pain? and What does it keep you from doing?  (e.g., Scale 1-10; mild, moderate, or severe)   - MILD (1-3): doesn't interfere with normal activities    - MODERATE (4-7): interferes with normal activities or awakens from sleep    - SEVERE (8-10): excruciating pain, unable to do any normal activities        moderate  6. CAUSE: What do you think is causing the headache?     URI sx 7. MIGRAINE: Have you been diagnosed with migraine headaches? If Yes, ask: Is this headache similar?      no 8. HEAD INJURY: Has there been any recent injury to the head?       no 9. OTHER SYMPTOMS: Do you have any other symptoms? (fever, stiff neck, eye pain, sore throat, cold symptoms)     Nasal drainage, body aches, cough afebrile  Protocols used: Headache-A-AH, Cough - Acute Non-Productive-A-AH

## 2023-04-16 ENCOUNTER — Ambulatory Visit (INDEPENDENT_AMBULATORY_CARE_PROVIDER_SITE_OTHER): Payer: BC Managed Care – PPO | Admitting: Family

## 2023-04-16 ENCOUNTER — Telehealth: Payer: Self-pay | Admitting: Family Medicine

## 2023-04-16 ENCOUNTER — Encounter: Payer: Self-pay | Admitting: Family

## 2023-04-16 ENCOUNTER — Ambulatory Visit (INDEPENDENT_AMBULATORY_CARE_PROVIDER_SITE_OTHER): Payer: BC Managed Care – PPO

## 2023-04-16 VITALS — BP 131/78 | HR 73 | Temp 98.5°F | Wt 159.2 lb

## 2023-04-16 DIAGNOSIS — R062 Wheezing: Secondary | ICD-10-CM

## 2023-04-16 DIAGNOSIS — J209 Acute bronchitis, unspecified: Secondary | ICD-10-CM

## 2023-04-16 DIAGNOSIS — R6889 Other general symptoms and signs: Secondary | ICD-10-CM | POA: Diagnosis not present

## 2023-04-16 LAB — VERITOR FLU A/B WAIVED
Influenza A: NEGATIVE
Influenza B: NEGATIVE

## 2023-04-16 MED ORDER — PREDNISONE 10 MG (21) PO TBPK: ORAL_TABLET | ORAL | 0 refills | Status: DC

## 2023-04-16 MED ORDER — ALBUTEROL SULFATE (2.5 MG/3ML) 0.083% IN NEBU: 2.5000 mg | INHALATION_SOLUTION | Freq: Four times a day (QID) | RESPIRATORY_TRACT | 1 refills | Status: AC | PRN

## 2023-04-16 MED ORDER — ALBUTEROL SULFATE HFA 108 (90 BASE) MCG/ACT IN AERS: 1.0000 | INHALATION_SPRAY | Freq: Four times a day (QID) | RESPIRATORY_TRACT | 6 refills | Status: AC | PRN

## 2023-04-16 NOTE — Patient Instructions (Signed)

## 2023-04-16 NOTE — Telephone Encounter (Signed)
 Appt today

## 2023-04-16 NOTE — Addendum Note (Signed)
 Addended by: Lisbeth Rides on: 04/16/2023 12:27 PM   Modules accepted: Orders

## 2023-04-16 NOTE — Progress Notes (Signed)
 Subjective:    Patient ID: [name], female    DOB: 01-09-1961, 63 y.o.   MRN: [id number]  Chief Complaint  Patient presents with   Nasal Congestion    Body aches, headache, some SOB Started 4 days.robitussin and mucinex is helping some.   PT presents to the office today with body aches, headache, and SOB that started four days ago.  URI  This is a new problem. The current episode started in the past 7 days. The problem has been unchanged. The maximum temperature recorded prior to her arrival was 101 - 101.9 F. Associated symptoms include congestion, coughing, ear pain, headaches, joint pain, sinus pain and a sore throat (improved). Pertinent negatives include no nausea or vomiting. She has tried increased fluids and decongestant for the symptoms. The treatment provided mild relief.      Review of Systems  HENT:  Positive for congestion, ear pain, sinus pain and sore throat (improved).   Respiratory:  Positive for cough.   Gastrointestinal:  Negative for nausea and vomiting.  Musculoskeletal:  Positive for joint pain.  Neurological:  Positive for headaches.  All other systems reviewed and are negative.   Social History   Socioeconomic History   Marital status: Married    Spouse name: Cordella   Number of children: 2   Years of education: Not on file   Highest education level: Not on file  Occupational History   Not on file  Tobacco Use   Smoking status: Never   Smokeless tobacco: Never  Vaping Use   Vaping status: Never Used  Substance and Sexual Activity   Alcohol use: Not Currently    Comment: very rare   Drug use: No   Sexual activity: Yes    Birth control/protection: Surgical    Comment: ablation  Other Topics Concern   Not on file  Social History Narrative   Not on file   Social Drivers of Health   Financial Resource Strain: Not on file  Food Insecurity: Not on file  Transportation Needs: Not on file  Physical Activity: Not on file  Stress: Not  on file  Social Connections: Not on file   Family History  Problem Relation Age of Onset   Cirrhosis Father    Colon polyps Mother    Colon cancer Neg Hx    Esophageal cancer Neg Hx    Rectal cancer Neg Hx    Stomach cancer Neg Hx         Objective:   Physical Exam Vitals reviewed.  Constitutional:      General: She is not in acute distress.    Appearance: She is well-developed.  HENT:     Head: Normocephalic and atraumatic.     Right Ear: Tympanic membrane normal.     Left Ear: Tympanic membrane normal.  Eyes:     Pupils: Pupils are equal, round, and reactive to light.  Neck:     Thyroid : No thyromegaly.  Cardiovascular:     Rate and Rhythm: Normal rate and regular rhythm.     Heart sounds: Normal heart sounds. No murmur heard. Pulmonary:     Effort: Pulmonary effort is normal. No respiratory distress.     Breath sounds: Wheezing present.  Abdominal:     General: Bowel sounds are normal. There is no distension.     Palpations: Abdomen is soft.     Tenderness: There is no abdominal tenderness.  Musculoskeletal:        General: No tenderness.  Normal range of motion.     Cervical back: Normal range of motion and neck supple.  Skin:    General: Skin is warm and dry.  Neurological:     Mental Status: She is alert and oriented to person, place, and time.     Cranial Nerves: No cranial nerve deficit.     Deep Tendon Reflexes: Reflexes are normal and symmetric.  Psychiatric:        Behavior: Behavior normal.        Thought Content: Thought content normal.        Judgment: Judgment normal.       BP 131/78   Pulse 73   Temp 98.5 F (36.9 C)   Wt 159 lb 3.2 oz (72.2 kg)   SpO2 95%   BMI 26.49 kg/m      Assessment & Plan:  [name] comes in today with chief complaint of Nasal Congestion (Body aches, headache, some SOB Started 4 days.robitussin and mucinex is helping some.)   Diagnosis and orders addressed:  1. Flu-like symptoms (Primary)  -  Veritor Flu A/B Waived - DG Chest 2 View; Future  2. Acute bronchitis, unspecified organism - Take meds as prescribed - Use a cool mist humidifier  -Use saline nose sprays frequently -Force fluids -For any cough or congestion  Use plain Mucinex- regular strength or max strength is fine -For fever or aces or pains- take tylenol  or ibuprofen. -Throat lozenges if help -Follow up if symptoms worsen or do not improve  - albuterol  (VENTOLIN  HFA) 108 (90 Base) MCG/ACT inhaler; Inhale 1-2 puffs into the lungs every 6 (six) hours as needed for wheezing or shortness of breath.  Dispense: 8.5 each; Refill: 6 - predniSONE  (STERAPRED UNI-PAK 21 TAB) 10 MG (21) TBPK tablet; Use as directed  Dispense: 21 tablet; Refill: 0 - albuterol  (PROVENTIL ) (2.5 MG/3ML) 0.083% nebulizer solution; Take 3 mLs (2.5 mg total) by nebulization every 6 (six) hours as needed for wheezing or shortness of breath.  Dispense: 150 mL; Refill: 1 - For home use only DME Nebulizer machine  3. Wheezing - albuterol  (PROVENTIL ) (2.5 MG/3ML) 0.083% nebulizer solution; Take 3 mLs (2.5 mg total) by nebulization every 6 (six) hours as needed for wheezing or shortness of breath.  Dispense: 150 mL; Refill: 1 - For home use only DME Nebulizer machine   Flu negative today, but I am not 100 % sure that swab is correct.  X-ray pending  Albuterol  inhaler and nebulizer as needed Prednisone   Follow up if symptoms worsen or do not improve     Bari Learn, FNP

## 2023-04-20 ENCOUNTER — Inpatient Hospital Stay: Payer: BC Managed Care – PPO | Attending: Hematology and Oncology | Admitting: Hematology and Oncology

## 2023-04-20 VITALS — BP 136/65 | HR 70 | Temp 97.9°F | Resp 17 | Ht 65.0 in | Wt 162.2 lb

## 2023-04-20 DIAGNOSIS — R509 Fever, unspecified: Secondary | ICD-10-CM | POA: Insufficient documentation

## 2023-04-20 DIAGNOSIS — R0981 Nasal congestion: Secondary | ICD-10-CM | POA: Insufficient documentation

## 2023-04-20 DIAGNOSIS — Z79899 Other long term (current) drug therapy: Secondary | ICD-10-CM | POA: Diagnosis not present

## 2023-04-20 DIAGNOSIS — R635 Abnormal weight gain: Secondary | ICD-10-CM | POA: Insufficient documentation

## 2023-04-20 DIAGNOSIS — Z79811 Long term (current) use of aromatase inhibitors: Secondary | ICD-10-CM | POA: Insufficient documentation

## 2023-04-20 DIAGNOSIS — M791 Myalgia, unspecified site: Secondary | ICD-10-CM | POA: Diagnosis not present

## 2023-04-20 DIAGNOSIS — Z1721 Progesterone receptor positive status: Secondary | ICD-10-CM | POA: Diagnosis not present

## 2023-04-20 DIAGNOSIS — I890 Lymphedema, not elsewhere classified: Secondary | ICD-10-CM | POA: Diagnosis not present

## 2023-04-20 DIAGNOSIS — M2550 Pain in unspecified joint: Secondary | ICD-10-CM | POA: Insufficient documentation

## 2023-04-20 DIAGNOSIS — R062 Wheezing: Secondary | ICD-10-CM | POA: Diagnosis not present

## 2023-04-20 DIAGNOSIS — C50412 Malignant neoplasm of upper-outer quadrant of left female breast: Secondary | ICD-10-CM | POA: Insufficient documentation

## 2023-04-20 DIAGNOSIS — Z923 Personal history of irradiation: Secondary | ICD-10-CM | POA: Diagnosis not present

## 2023-04-20 DIAGNOSIS — Z170 Estrogen receptor positive status [ER+]: Secondary | ICD-10-CM | POA: Insufficient documentation

## 2023-04-20 DIAGNOSIS — Z888 Allergy status to other drugs, medicaments and biological substances status: Secondary | ICD-10-CM | POA: Insufficient documentation

## 2023-04-20 MED ORDER — AZITHROMYCIN 250 MG PO TABS: ORAL_TABLET | ORAL | 0 refills | Status: DC

## 2023-04-20 NOTE — Progress Notes (Signed)
 Patient Care Team: Eliodoro Guerin, DO as PCP - General (Family Medicine) Cameron Cea, MD as Consulting Physician (Hematology and Oncology) Oza Blumenthal, MD as Consulting Physician (General Surgery) Johna Myers, MD as Consulting Physician (Radiation Oncology) Dama Duffel, NP as Nurse Practitioner (Hematology and Oncology)  DIAGNOSIS:  Encounter Diagnosis  Name Primary?   Malignant neoplasm of upper-outer quadrant of left breast in female, estrogen receptor positive (HCC) Yes    SUMMARY OF ONCOLOGIC HISTORY: Oncology History  Malignant neoplasm of upper-outer quadrant of left breast in female, estrogen receptor positive (HCC)  07/18/2014 Mammogram   Distortion left breast, breast density category C; U/S 1.3 x 0.8 x 0.8 cm left breast mass at 2:00 position 4 cm from nipple, no lymph nodes   07/20/2014 Initial Diagnosis   Left breast Biopsy: Invasive lobular cancer with LCIS, Grade 1, ER 86%, PR 78%, Her 2 Neg Ratio 1.77, Ki 67: 14%   08/01/2014 Breast MRI   Breast MRI showed non-mass enhancement 3.9 cm, no lymph nodes   08/01/2014 Clinical Stage   Stage IIA: T2 N0   08/08/2014 Surgery   Left breast lumpectomy: 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, with LCIS, medial and inferior margins positive, 3/4 lymph nodes positive   08/08/2014 Pathologic Stage   Stage IIB: T2 N1c M0   08/16/2014 Surgery   Left breast medial margin reexcision residual ILC 0.2 cm; inferior margin residual foci less than 0.2 cm, 1/5 lymph nodes positive, 1 lymph node with isolated tumor cells (Overall 4/10)   09/04/2014 - 01/15/2015 Chemotherapy   Adjuvant chemotherapy with dose dense Adriamycin  and Cytoxan  4 followed by Abraxane  weekly 8 ( stopped early for profound neutropenia and thrombocytopenia)   01/29/2015 - 03/20/2015 Radiation Therapy   Adjuvant XRT [facility]): 50.4 Gy over 28 fractions; seroma boost: 10 Gy over 5 fractions. Total dose: 60.4 Gy   04/11/2015 -  Anti-estrogen oral  therapy   Anastrozole  1 mg daily (PALLAS clinical trial 05/24/2015 patient was randomized to hormone therapy alone)   05/24/2015 Survivorship   Survivorship visit completed and copy of care plan given to patient   11/26/2016 PET scan   Small Left sided level 2 LN with hypermetabolism, post surg changes in breast and small right groin lymph node likely infectious/inflammatory, gallstones     CHIEF COMPLIANT: Severe upper respiratory infection HISTORY OF PRESENT ILLNESS:  History of Present Illness   [name] is a 63 year old female with a history of breast cancer and lung surgery who presents with respiratory symptoms suggestive of acute bronchitis.  She has been experiencing respiratory symptoms that began last Monday while at work, initially presenting as severe sinus congestion. Mucinex and Allegra provided some relief, but symptoms worsened throughout the week. She was unable to see her regular healthcare provider due to her vacation and had to wait two days for an appointment. During this visit, she was informed of wheezing in her chest and underwent a chest x-ray, which would not be available until the following Monday. No COVID-19 or influenza A and B were detected. She experienced fever and was prescribed prednisone , which she is unable to tolerate due to severe side effects, including disturbing dreams, leading her to discard the medication after three doses. A breathing treatment was effective in alleviating her symptoms, particularly wheezing when lying down at night.  She mentions a history of lung surgery, resulting in a smaller left lung, and notes that this is the first significant respiratory illness she has  experienced since her cancer treatment.  She expresses concern about the potential impact of her illness on her coworkers, particularly those with existing respiratory issues.         ALLERGIES:  is allergic to statins, lipitor [atorvastatin], and  temazepam.  MEDICATIONS:  Current Outpatient Medications  Medication Sig Dispense Refill   azithromycin  (ZITHROMAX  Z-PAK) 250 MG tablet Use as directed 6 each 0   acetaminophen  (TYLENOL ) 500 MG tablet Take 1,500 mg by mouth daily as needed for moderate pain.     albuterol  (PROVENTIL ) (2.5 MG/3ML) 0.083% nebulizer solution Take 3 mLs (2.5 mg total) by nebulization every 6 (six) hours as needed for wheezing or shortness of breath. 150 mL 1   albuterol  (VENTOLIN  HFA) 108 (90 Base) MCG/ACT inhaler Inhale 1-2 puffs into the lungs every 6 (six) hours as needed for wheezing or shortness of breath. 8.5 each 6   alendronate  (FOSAMAX ) 70 MG tablet TAKE 1 TABLET ONCE A WEEK. TAKE WITH A FULL GLASS OF WATER  ON AN EMPTY STOMACH. 12 tablet 3   anastrozole  (ARIMIDEX ) 1 MG tablet TAKE 1 TABLET BY MOUTH EVERY DAY 90 tablet 3   Biotin 60454 MCG TABS Take 10,000 mcg by mouth daily.     Calcium  Carb-Cholecalciferol (CALCIUM  600 + D PO) Take 1 tablet by mouth at bedtime.     clobetasol  cream (TEMOVATE ) 0.05 % Apply 1 Application topically 2 (two) times daily as needed (irritation). 30 g 0   clonazePAM  (KLONOPIN ) 0.5 MG tablet 1 tablet 3 times daily as needed for meniere's attack 90 tablet 5   donepezil  (ARICEPT ) 10 MG tablet Take 1 tablet (10 mg total) by mouth at bedtime. 90 tablet 4   furosemide  (LASIX ) 20 MG tablet TAKE 1 TABLET BY MOUTH EVERY DAY 30 tablet 3   hydrOXYzine  (ATARAX ) 25 MG tablet Take 0.5-1 tablets (12.5-25 mg total) by mouth 3 (three) times daily as needed for itching. 30 tablet 0   meclizine  (ANTIVERT ) 25 MG tablet Take 1 tablet (25 mg total) by mouth 3 (three) times daily as needed for dizziness. 30 tablet 1   PARoxetine  (PAXIL -CR) 25 MG 24 hr tablet Take 1 tablet (25 mg total) by mouth daily. 90 tablet 3   potassium chloride  (KLOR-CON ) 10 MEQ tablet Take 1 tablet (10 mEq total) by mouth daily as needed (when taking lasix ). 90 tablet 3   rosuvastatin  (CRESTOR ) 10 MG tablet Take 1 tablet (10 mg  total) by mouth daily. 90 tablet 3   Semaglutide, 1 MG/DOSE, 2 MG/1.5ML SOPN Inject into the skin. [facility] compounded     triamcinolone  cream (KENALOG ) 0.1 % Apply 1 application topically 2 (two) times daily as needed (irritation).     Turmeric 500 MG TABS Take 1 tablet by mouth 2 (two) times daily.     vitamin B-12 (CYANOCOBALAMIN) 1000 MCG tablet Take 1,000 mcg by mouth at bedtime.     Vitamin D , Ergocalciferol , (DRISDOL ) 1.25 MG (50000 UNIT) CAPS capsule TAKE 1 CAPSULE (50,000 UNITS TOTAL) BY MOUTH EVERY 7 (SEVEN) DAYS. 12 capsule 3   No current facility-administered medications for this visit.    PHYSICAL EXAMINATION: ECOG PERFORMANCE STATUS: 1 - Symptomatic but completely ambulatory  Vitals:   04/20/23 1204  BP: 136/65  Pulse: 70  Resp: 17  Temp: 97.9 F (36.6 C)  SpO2: 97%   Filed Weights   04/20/23 1204  Weight: 162 lb 3.2 oz (73.6 kg)    Physical Exam   CHEST: Decreased breath sounds in area of  previous surgery. Rest of lungs clear.      (exam performed in the presence of a chaperone)  LABORATORY DATA:  I have reviewed the data as listed    Latest Ref Rng & Units 01/21/2023    7:48 AM 07/16/2022    1:19 PM 07/16/2022   12:56 PM  CMP  Glucose 70 - 99 mg/dL 161   90   BUN 6 - 20 mg/dL 10   11   Creatinine 0.96 - 1.00 mg/dL 0.45  4.09  8.11   Sodium 135 - 145 mmol/L 142   140   Potassium 3.5 - 5.1 mmol/L 3.8   3.9   Chloride 98 - 111 mmol/L 106   105   CO2 22 - 32 mmol/L 30   21   Calcium  8.9 - 10.3 mg/dL 8.9   9.0   Total Protein 6.5 - 8.1 g/dL 6.7   7.2   Total Bilirubin <1.2 mg/dL 0.8   0.8   Alkaline Phos 38 - 126 U/L 50   52   AST 15 - 41 U/L 16   27   ALT 0 - 44 U/L 19   26     Lab Results  Component Value Date   WBC 5.2 01/21/2023   HGB 14.0 01/21/2023   HCT 41.7 01/21/2023   MCV 95.0 01/21/2023   PLT 185 01/21/2023   NEUTROABS 2.5 01/21/2023    ASSESSMENT & PLAN:  Malignant neoplasm of upper-outer quadrant of left breast in female,  estrogen receptor positive (HCC) Left breast lumpectomy 08/08/2014: 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 Adjuvant chemotherapy with dose dense Adriamycin  and Cytoxan  x 4 followed by Abraxane  weekly 12 because of lymph node positive disease. Started 09/04/14 to 01/15/15 2. adjuvant radiation completed 03/20/15 3. Followed by antiestrogen therapy started 04/03/15 -------------------------------------------------------------------------------------------------------------------------- Anastrozole  toxicities: Patient denies any hot flashes. Weight gain Arthralgias and myalgias    PALLAS Trial : Patient has been randomized to anastrozole  alone. ABC clinical trial: The trial randomizes between aspirin  versus placebo. She went on trial 07/26/2015 Currently on 100 mg of dosage.  No adverse effects from participating in the trial.   Adverse effects: 1. Bruising/ Hematoma:  Currently taking 100 mg of aspirin /placebo: Mild 2. Osteoporosis: Fosamax  weekly, DEXA scan 07/28/2019: T score -2.4    Cognitive dysfunction: On Aricept  10 mg daily, followed by PCP, doing much better Profound lymphedema:    Breast cancer surveillance:  1. Mammogram 08/06/2021 benign 2. Breast exam 01/21/2023: No palpable lumps or nodules of concern.    Bone density 07/26/2015: T score -1.9, osteopenia Bone density: 07/28/19 T score -2.1 currently on Fosamax    No clinical evidence of breast cancer recurrence.   03/08/2021: PET CT scan: 9 mm posterior left lower lobe nodule 04/01/2021: Left lower lobe wedge resection (Dr. 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   Severe bronchitis/sinusitis: Influenza  AMB were negative.  Chest x-ray does not show any evidence of pneumonia.  I we will treat the patient with azithromycin .      Acute Bronchitis Acute bronchitis with severe sinus congestion, wheezing, and persistent cough since last Monday. No fever. Lung x-ray shows no pneumonia. COVID-19 and influenza A/B tests negative. Likely viral etiology with possible secondary bacterial infection. - Prescribe azithromycin  (Z-Pak): 2 pills on the first day, then 1 pill daily for the next 4 days - Continue breathing treatments as needed - Encourage rest, hydration, and use of Tylenol  for symptom relief - Advise consumption of yogurt to maintain gut flora during antibiotic treatment  Post-Lung Surgery Post-lung surgery with 20% of lung resected. Lower breath sounds on the left side, consistent with surgical history. No new abnormalities. Emphasized the importance of monitoring respiratory status closely due to increased susceptibility to infections. - Monitor respiratory status closely - Continue current management and follow-up as needed  General Health Maintenance Up-to-date on COVID-19 vaccinations and other recommended immunizations. - Continue routine health maintenance and vaccinations as recommended  Follow-up - Follow up if symptoms persist or worsen - Consider returning to work on Thursday if symptoms improve.          No orders of the defined types were placed in this encounter.  The patient has a good understanding of the overall plan. she agrees with it. she will call with any problems that may develop before the next visit here. Total  time spent: 30 mins including face to face time and time spent for planning, charting and co-ordination of care   Jade K Brendaly Townsel, MD 04/20/23

## 2023-04-20 NOTE — Assessment & Plan Note (Signed)
 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 Adjuvant chemotherapy with dose dense Adriamycin  and Cytoxan  x 4 followed by Abraxane  weekly 12 because of lymph node positive disease. Started 09/04/14 to 01/15/15 2. adjuvant radiation completed 03/20/15 3. Followed by antiestrogen therapy started 04/03/15 -------------------------------------------------------------------------------------------------------------------------- Anastrozole  toxicities: Patient denies any hot flashes. Weight gain Arthralgias and myalgias    PALLAS Trial : Patient has been randomized to anastrozole  alone. ABC clinical trial: The trial randomizes between aspirin  versus placebo. She went on trial 07/26/2015 Currently on 100 mg of dosage.  No adverse effects from participating in the trial.   Adverse effects: 1. Bruising/ Hematoma:  Currently taking 100 mg of aspirin /placebo: Mild 2. Osteoporosis: Fosamax  weekly, DEXA scan 07/28/2019: T score -2.4    Cognitive dysfunction: On Aricept  10 mg daily, followed by PCP, doing much better Profound lymphedema:    Breast cancer surveillance:  1. Mammogram 08/06/2021 benign 2. Breast exam 01/21/2023: No palpable lumps or nodules of concern.    Bone density 07/26/2015: T score -1.9, osteopenia Bone density: 07/28/19 T score -2.1 currently on Fosamax    No clinical evidence of breast cancer recurrence.   03/08/2021: PET CT scan: 9 mm posterior left lower lobe nodule 04/01/2021: Left lower lobe wedge resection (Dr. 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   Severe bronchitis/sinusitis: Influenza AMB were negative.  Chest x-ray does not show any evidence of pneumonia.  I we will treat the patient with azithromycin .

## 2023-05-06 ENCOUNTER — Encounter: Payer: Self-pay | Admitting: Emergency Medicine

## 2023-05-23 ENCOUNTER — Other Ambulatory Visit: Payer: Self-pay | Admitting: Family Medicine

## 2023-06-23 ENCOUNTER — Encounter: Payer: Self-pay | Admitting: Hematology and Oncology

## 2023-07-08 ENCOUNTER — Ambulatory Visit: Admitting: Emergency Medicine

## 2023-07-10 ENCOUNTER — Encounter: Payer: Self-pay | Admitting: Hematology and Oncology

## 2023-07-14 ENCOUNTER — Encounter (HOSPITAL_COMMUNITY): Payer: Self-pay

## 2023-07-14 ENCOUNTER — Ambulatory Visit (HOSPITAL_COMMUNITY)
Admission: RE | Admit: 2023-07-14 | Discharge: 2023-07-14 | Disposition: A | Discharge status: Home / Self Care | Source: Ambulatory Visit | Attending: Hematology and Oncology | Admitting: Hematology and Oncology

## 2023-07-14 DIAGNOSIS — C50412 Malignant neoplasm of upper-outer quadrant of left female breast: Secondary | ICD-10-CM | POA: Insufficient documentation

## 2023-07-14 DIAGNOSIS — Z9049 Acquired absence of other specified parts of digestive tract: Secondary | ICD-10-CM | POA: Diagnosis not present

## 2023-07-14 DIAGNOSIS — I700 Atherosclerosis of aorta: Secondary | ICD-10-CM | POA: Diagnosis not present

## 2023-07-14 DIAGNOSIS — Z170 Estrogen receptor positive status [ER+]: Secondary | ICD-10-CM | POA: Insufficient documentation

## 2023-07-14 DIAGNOSIS — R911 Solitary pulmonary nodule: Secondary | ICD-10-CM | POA: Diagnosis not present

## 2023-07-14 MED ORDER — IOHEXOL 300 MG/ML  SOLN
100.0000 mL | Freq: Once | INTRAMUSCULAR | Status: AC | PRN
  Administered 2023-07-14: 100 mL via INTRAVENOUS

## 2023-07-14 MED ORDER — SODIUM CHLORIDE (PF) 0.9 % IJ SOLN
INTRAMUSCULAR | Status: AC
  Filled 2023-07-14: qty 50

## 2023-07-22 ENCOUNTER — Other Ambulatory Visit: Payer: Self-pay | Admitting: *Deleted

## 2023-07-22 DIAGNOSIS — C50412 Malignant neoplasm of upper-outer quadrant of left female breast: Secondary | ICD-10-CM

## 2023-07-22 NOTE — Assessment & Plan Note (Deleted)
 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 Adjuvant chemotherapy with dose dense Adriamycin  and Cytoxan  x 4 followed by Abraxane  weekly 12 because of lymph node positive disease. Started 09/04/14 to 01/15/15 2. adjuvant radiation completed 03/20/15 3. Followed by antiestrogen therapy started 04/03/15 -------------------------------------------------------------------------------------------------------------------------- Anastrozole  toxicities: Patient denies any hot flashes. Weight gain Arthralgias and myalgias    PALLAS Trial : Patient has been randomized to anastrozole  alone. ABC clinical trial: The trial randomizes between aspirin  versus placebo. She went on trial 07/26/2015 Currently on 100 mg of dosage.  No adverse effects from participating in the trial.   Adverse effects: 1. Bruising/ Hematoma:  Currently taking 100 mg of aspirin /placebo: Mild 2. Osteoporosis: Fosamax  weekly, DEXA scan 07/28/2019: T score -2.4    Cognitive dysfunction: On Aricept  10 mg daily, followed by PCP, doing much better Profound lymphedema:    Breast cancer surveillance:  1. Mammogram 08/06/2021 benign 2. Breast exam 01/21/2023: No palpable lumps or nodules of concern.    Bone density 07/26/2015: T score -1.9, osteopenia Bone density: 07/28/19 T score -2.1 currently on Fosamax    No clinical evidence of breast cancer recurrence.   03/08/2021: PET CT scan: 9 mm posterior left lower lobe nodule 04/01/2021: Left lower lobe wedge resection (Dr. 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 CT CAP 07/14/2023: Stable, no evidence of recurrent disease, stable 3 mm right middle lobe lung nodule benign  Annual CT scans and follow-ups

## 2023-07-23 ENCOUNTER — Inpatient Hospital Stay: Payer: BC Managed Care – PPO

## 2023-07-23 ENCOUNTER — Inpatient Hospital Stay: Payer: BC Managed Care – PPO | Attending: Hematology and Oncology | Admitting: Hematology and Oncology

## 2023-07-23 DIAGNOSIS — Z888 Allergy status to other drugs, medicaments and biological substances status: Secondary | ICD-10-CM | POA: Insufficient documentation

## 2023-07-23 DIAGNOSIS — C50412 Malignant neoplasm of upper-outer quadrant of left female breast: Secondary | ICD-10-CM | POA: Insufficient documentation

## 2023-07-23 DIAGNOSIS — C3411 Malignant neoplasm of upper lobe, right bronchus or lung: Secondary | ICD-10-CM | POA: Insufficient documentation

## 2023-07-23 DIAGNOSIS — Z79811 Long term (current) use of aromatase inhibitors: Secondary | ICD-10-CM | POA: Insufficient documentation

## 2023-07-23 DIAGNOSIS — Z79899 Other long term (current) drug therapy: Secondary | ICD-10-CM | POA: Insufficient documentation

## 2023-07-23 DIAGNOSIS — Z923 Personal history of irradiation: Secondary | ICD-10-CM | POA: Insufficient documentation

## 2023-07-23 DIAGNOSIS — Z7983 Long term (current) use of bisphosphonates: Secondary | ICD-10-CM | POA: Insufficient documentation

## 2023-07-23 DIAGNOSIS — I890 Lymphedema, not elsewhere classified: Secondary | ICD-10-CM | POA: Insufficient documentation

## 2023-07-23 DIAGNOSIS — Z170 Estrogen receptor positive status [ER+]: Secondary | ICD-10-CM | POA: Insufficient documentation

## 2023-07-23 DIAGNOSIS — Z1732 Human epidermal growth factor receptor 2 negative status: Secondary | ICD-10-CM | POA: Insufficient documentation

## 2023-07-23 DIAGNOSIS — Z1721 Progesterone receptor positive status: Secondary | ICD-10-CM | POA: Insufficient documentation

## 2023-07-27 ENCOUNTER — Inpatient Hospital Stay: Admitting: Hematology and Oncology

## 2023-07-27 VITALS — BP 143/64 | HR 63 | Temp 98.6°F | Resp 18 | Ht 65.0 in | Wt 161.2 lb

## 2023-07-27 DIAGNOSIS — C3490 Malignant neoplasm of unspecified part of unspecified bronchus or lung: Secondary | ICD-10-CM | POA: Diagnosis not present

## 2023-07-27 DIAGNOSIS — Z79811 Long term (current) use of aromatase inhibitors: Secondary | ICD-10-CM | POA: Diagnosis not present

## 2023-07-27 DIAGNOSIS — Z7983 Long term (current) use of bisphosphonates: Secondary | ICD-10-CM | POA: Diagnosis not present

## 2023-07-27 DIAGNOSIS — Z170 Estrogen receptor positive status [ER+]: Secondary | ICD-10-CM

## 2023-07-27 DIAGNOSIS — Z79899 Other long term (current) drug therapy: Secondary | ICD-10-CM | POA: Diagnosis not present

## 2023-07-27 DIAGNOSIS — C3411 Malignant neoplasm of upper lobe, right bronchus or lung: Secondary | ICD-10-CM | POA: Diagnosis not present

## 2023-07-27 DIAGNOSIS — C50412 Malignant neoplasm of upper-outer quadrant of left female breast: Secondary | ICD-10-CM | POA: Diagnosis not present

## 2023-07-27 DIAGNOSIS — I890 Lymphedema, not elsewhere classified: Secondary | ICD-10-CM | POA: Diagnosis not present

## 2023-07-27 DIAGNOSIS — Z1732 Human epidermal growth factor receptor 2 negative status: Secondary | ICD-10-CM | POA: Diagnosis not present

## 2023-07-27 DIAGNOSIS — Z923 Personal history of irradiation: Secondary | ICD-10-CM | POA: Diagnosis not present

## 2023-07-27 DIAGNOSIS — Z1721 Progesterone receptor positive status: Secondary | ICD-10-CM | POA: Diagnosis not present

## 2023-07-27 DIAGNOSIS — Z888 Allergy status to other drugs, medicaments and biological substances status: Secondary | ICD-10-CM | POA: Diagnosis not present

## 2023-07-27 NOTE — Assessment & Plan Note (Signed)
 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 Adjuvant chemotherapy with dose dense Adriamycin  and Cytoxan  x 4 followed by Abraxane  weekly 12 because of lymph node positive disease. Started 09/04/14 to 01/15/15 2. adjuvant radiation completed 03/20/15 3. Followed by antiestrogen therapy started 04/03/15 -------------------------------------------------------------------------------------------------------------------------- Anastrozole  toxicities: Patient denies any hot flashes. Weight gain Arthralgias and myalgias    PALLAS Trial : Patient has been randomized to anastrozole  alone. ABC clinical trial: The trial randomizes between aspirin  versus placebo. She went on trial 07/26/2015 Currently on 100 mg of dosage.  No adverse effects from participating in the trial.   Adverse effects: 1. Bruising/ Hematoma:  Currently taking 100 mg of aspirin /placebo: Mild 2. Osteoporosis: Fosamax  weekly, DEXA scan 07/28/2019: T score -2.4    Cognitive dysfunction: On Aricept  10 mg daily, followed by PCP, doing much better Profound lymphedema:    Breast cancer surveillance:  1. Mammogram 08/06/2021 benign 2. Breast exam 01/21/2023: No palpable lumps or nodules of concern.    Bone density 07/26/2015: T score -1.9, osteopenia Bone density: 07/28/19 T score -2.1 currently on Fosamax    No clinical evidence of breast cancer recurrence.   03/08/2021: PET CT scan: 9 mm posterior left lower lobe nodule 04/01/2021: Left lower lobe wedge resection (Dr. 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 CT CAP 07/14/2023: Stable

## 2023-07-27 NOTE — Progress Notes (Signed)
 Patient Care Team: Eliodoro Guerin, DO as PCP - General (Family Medicine) Cameron Cea, MD as Consulting Physician (Hematology and Oncology) Oza Blumenthal, MD as Consulting Physician (General Surgery) Johna Myers, MD as Consulting Physician (Radiation Oncology) Dama Duffel, NP as Nurse Practitioner (Hematology and Oncology)  DIAGNOSIS:  Encounter Diagnoses  Name Primary?   Malignant neoplasm of unspecified part of unspecified bronchus or lung (HCC) Yes   Malignant neoplasm of upper-outer quadrant of left breast in female, estrogen receptor positive (HCC)     SUMMARY OF ONCOLOGIC HISTORY: Oncology History  Malignant neoplasm of upper-outer quadrant of left breast in female, estrogen receptor positive (HCC)  07/18/2014 Mammogram   Distortion left breast, breast density category C; U/S 1.3 x 0.8 x 0.8 cm left breast mass at 2:00 position 4 cm from nipple, no lymph nodes   07/20/2014 Initial Diagnosis   Left breast Biopsy: Invasive lobular cancer with LCIS, Grade 1, ER 86%, PR 78%, Her 2 Neg Ratio 1.77, Ki 67: 14%   08/01/2014 Breast MRI   Breast MRI showed non-mass enhancement 3.9 cm, no lymph nodes   08/01/2014 Clinical Stage   Stage IIA: T2 N0   08/08/2014 Surgery   Left breast lumpectomy: 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, with LCIS, medial and inferior margins positive, 3/4 lymph nodes positive   08/08/2014 Pathologic Stage   Stage IIB: T2 N1c M0   08/16/2014 Surgery   Left breast medial margin reexcision residual ILC 0.2 cm; inferior margin residual foci less than 0.2 cm, 1/5 lymph nodes positive, 1 lymph node with isolated tumor cells (Overall 4/10)   09/04/2014 - 01/15/2015 Chemotherapy   Adjuvant chemotherapy with dose dense Adriamycin  and Cytoxan  4 followed by Abraxane  weekly 8 ( stopped early for profound neutropenia and thrombocytopenia)   01/29/2015 - 03/20/2015 Radiation Therapy   Adjuvant XRT [facility]): 50.4 Gy over 28 fractions; seroma  boost: 10 Gy over 5 fractions. Total dose: 60.4 Gy   04/11/2015 -  Anti-estrogen oral therapy   Anastrozole  1 mg daily (PALLAS clinical trial 05/24/2015 patient was randomized to hormone therapy alone)   05/24/2015 Survivorship   Survivorship visit completed and copy of care plan given to patient   11/26/2016 PET scan   Small Left sided level 2 LN with hypermetabolism, post surg changes in breast and small right groin lymph node likely infectious/inflammatory, gallstones     CHIEF COMPLIANT: Annual surveillance of breast cancer  HISTORY OF PRESENT ILLNESS:  History of Present Illness [name] is a 63 year old female with history of breast cancer and lung cancer who presents for routine follow-up and monitoring.  Recent imaging shows no evidence of breast cancer recurrence. She is interested in the Guardant Reveal blood test for additional monitoring. She takes Fosamax  weekly for bone health but occasionally misses doses. Her bone density is in the osteopenia range, with the last test conducted in May 2024 and the next scheduled for 2026. She has initiated a new fitness regimen, resulting in a three-pound weight loss since last week.     ALLERGIES:  is allergic to statins, lipitor [atorvastatin], and temazepam.  MEDICATIONS:  Current Outpatient Medications  Medication Sig Dispense Refill   acetaminophen  (TYLENOL ) 500 MG tablet Take 1,500 mg by mouth daily as needed for moderate pain.     albuterol  (PROVENTIL ) (2.5 MG/3ML) 0.083% nebulizer solution Take 3 mLs (2.5 mg total) by nebulization every 6 (six) hours as needed for wheezing or shortness of breath. 150 mL 1  albuterol  (VENTOLIN  HFA) 108 (90 Base) MCG/ACT inhaler Inhale 1-2 puffs into the lungs every 6 (six) hours as needed for wheezing or shortness of breath. 8.5 each 6   alendronate  (FOSAMAX ) 70 MG tablet TAKE 1 TABLET ONCE A WEEK. TAKE WITH A FULL GLASS OF WATER  ON AN EMPTY STOMACH. 12 tablet 3   anastrozole  (ARIMIDEX ) 1  MG tablet TAKE 1 TABLET BY MOUTH EVERY DAY 90 tablet 3   azithromycin  (ZITHROMAX  Z-PAK) 250 MG tablet Use as directed 6 each 0   Biotin 16109 MCG TABS Take 10,000 mcg by mouth daily.     Calcium  Carb-Cholecalciferol (CALCIUM  600 + D PO) Take 1 tablet by mouth at bedtime.     clobetasol  cream (TEMOVATE ) 0.05 % Apply 1 Application topically 2 (two) times daily as needed (irritation). 30 g 0   clonazePAM  (KLONOPIN ) 0.5 MG tablet 1 tablet 3 times daily as needed for meniere's attack 90 tablet 5   donepezil  (ARICEPT ) 10 MG tablet Take 1 tablet (10 mg total) by mouth at bedtime. 90 tablet 4   furosemide  (LASIX ) 20 MG tablet TAKE 1 TABLET BY MOUTH EVERY DAY 30 tablet 3   hydrOXYzine  (ATARAX ) 25 MG tablet Take 0.5-1 tablets (12.5-25 mg total) by mouth 3 (three) times daily as needed for itching. 30 tablet 0   meclizine  (ANTIVERT ) 25 MG tablet Take 1 tablet (25 mg total) by mouth 3 (three) times daily as needed for dizziness. 30 tablet 1   PARoxetine  (PAXIL -CR) 25 MG 24 hr tablet Take 1 tablet (25 mg total) by mouth daily. 90 tablet 3   potassium chloride  (KLOR-CON ) 10 MEQ tablet Take 1 tablet (10 mEq total) by mouth daily as needed (when taking lasix ). 90 tablet 3   rosuvastatin  (CRESTOR ) 10 MG tablet Take 1 tablet (10 mg total) by mouth daily. 90 tablet 3   Semaglutide, 1 MG/DOSE, 2 MG/1.5ML SOPN Inject into the skin. At [facility] compounded     triamcinolone  cream (KENALOG ) 0.1 % Apply 1 application topically 2 (two) times daily as needed (irritation).     Turmeric 500 MG TABS Take 1 tablet by mouth 2 (two) times daily.     vitamin B-12 (CYANOCOBALAMIN) 1000 MCG tablet Take 1,000 mcg by mouth at bedtime.     Vitamin D , Ergocalciferol , (DRISDOL ) 1.25 MG (50000 UNIT) CAPS capsule TAKE 1 CAPSULE (50,000 UNITS TOTAL) BY MOUTH EVERY 7 (SEVEN) DAYS. 12 capsule 3   No current facility-administered medications for this visit.    PHYSICAL EXAMINATION: ECOG PERFORMANCE STATUS: 1 - Symptomatic but completely  ambulatory  Vitals:   07/27/23 0801  BP: (!) 143/64  Pulse: 63  Resp: 18  Temp: 98.6 F (37 C)  SpO2: 97%   Filed Weights   07/27/23 0801  Weight: 161 lb 3.2 oz (73.1 kg)      LABORATORY DATA:  I have reviewed the data as listed    Latest Ref Rng & Units 01/21/2023    7:48 AM 07/16/2022    1:19 PM 07/16/2022   12:56 PM  CMP  Glucose 70 - 99 mg/dL 604   90   BUN 6 - 20 mg/dL 10   11   Creatinine 5.40 - 1.00 mg/dL 9.81  1.91  4.78   Sodium 135 - 145 mmol/L 142   140   Potassium 3.5 - 5.1 mmol/L 3.8   3.9   Chloride 98 - 111 mmol/L 106   105   CO2 22 - 32 mmol/L 30   21  Calcium  8.9 - 10.3 mg/dL 8.9   9.0   Total Protein 6.5 - 8.1 g/dL 6.7   7.2   Total Bilirubin <1.2 mg/dL 0.8   0.8   Alkaline Phos 38 - 126 U/L 50   52   AST 15 - 41 U/L 16   27   ALT 0 - 44 U/L 19   26     Lab Results  Component Value Date   WBC 5.2 01/21/2023   HGB 14.0 01/21/2023   HCT 41.7 01/21/2023   MCV 95.0 01/21/2023   PLT 185 01/21/2023   NEUTROABS 2.5 01/21/2023    ASSESSMENT & PLAN:  Malignant neoplasm of upper-outer quadrant of left breast in female, estrogen receptor positive (HCC) Left breast lumpectomy 08/08/2014: 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 Adjuvant chemotherapy with dose dense Adriamycin  and Cytoxan  x 4 followed by Abraxane  weekly 12 because of lymph node positive disease. Started 09/04/14 to 01/15/15 2. adjuvant radiation completed 03/20/15 3. Followed by antiestrogen therapy started  04/03/15 -------------------------------------------------------------------------------------------------------------------------- Anastrozole  toxicities: Patient denies any hot flashes. Weight gain Arthralgias and myalgias    PALLAS Trial : Patient has been randomized to anastrozole  alone. ABC clinical trial: The trial randomizes between aspirin  versus placebo. She went on trial 07/26/2015 Currently on 100 mg of dosage.  No adverse effects from participating in the trial.   Adverse effects: 1. Bruising/ Hematoma:  Currently taking 100 mg of aspirin /placebo: Mild 2. Osteoporosis: Fosamax  weekly, DEXA scan 07/28/2019: T score -2.4    Cognitive dysfunction: On Aricept  10 mg daily, followed by PCP, doing much better Profound lymphedema:    Breast cancer surveillance:  1. Mammogram 08/06/2021 benign 2. Breast exam 01/21/2023: No palpable lumps or nodules of concern.    Bone density 07/26/2015: T score -1.9, osteopenia Bone density: 07/28/19 T score -2.1 currently on Fosamax    No clinical evidence of breast cancer recurrence.   03/08/2021: PET CT scan: 9 mm posterior left lower lobe nodule 04/01/2021: Left lower lobe wedge resection (Dr. 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 CT CAP 07/14/2023: Stable  ------------------------------------- Assessment and Plan Assessment & Plan Breast Cancer Surveillance Guardant Reveal blood test detects recurrence up to two years early. Approved and covered by most insurances. Positive result requires closer monitoring. - Initiate Guardant Reveal blood test every six months. - Continue regular scans as scheduled. - Provide brochure on Guardant  Reveal.  Osteopenia Previous bone density scan showed values near osteoporosis. Risk of progression if not managed. Emphasized medication adherence. - Order bone density scan for May 2026. - Encourage adherence to Alendronate  70 MG oral once a week.      Orders Placed This Encounter  Procedures   CT CHEST ABDOMEN PELVIS W CONTRAST    Standing Status:   Future    Expected Date:   07/11/2024    Expiration Date:   07/26/2024  If indicated for the ordered procedure, I authorize the administration of contrast media per Radiology protocol:   Yes    Does the patient have a contrast media/X-ray dye allergy?:   No    Preferred imaging location?:   [facility]    Release to patient:   Immediate    If indicated for the ordered procedure, I authorize the administration of oral contrast media per Radiology protocol:   Yes   DG Bone Density    Standing Status:   Future    Expected Date:   08/15/2024    Scheduling Instructions:     At same time as mammograms    Reason for Exam (SYMPTOM  OR DIAGNOSIS REQUIRED):   post menopausal             [facility]    Preferred imaging location?:   External    Release to patient:   Immediate   The patient has a good understanding of the overall plan. she agrees with it. she will call with any problems that may develop before the next visit here. Total time spent: 30 mins including face to face time and time spent for planning, charting and co-ordination of care   Jade Sheerer, MD 07/27/23

## 2023-07-28 ENCOUNTER — Telehealth: Payer: Self-pay

## 2023-07-28 NOTE — Telephone Encounter (Signed)
 Per md orders entered for Guardant Reveal and all supported documents faxed to [phone]. Faxed confirmation was received.

## 2023-07-29 ENCOUNTER — Encounter: Payer: Self-pay | Admitting: Hematology and Oncology

## 2023-08-14 ENCOUNTER — Encounter: Payer: BC Managed Care – PPO | Admitting: Family Medicine

## 2023-08-14 DIAGNOSIS — Z1231 Encounter for screening mammogram for malignant neoplasm of breast: Secondary | ICD-10-CM | POA: Diagnosis not present

## 2023-08-14 LAB — HM MAMMOGRAPHY

## 2023-08-15 DIAGNOSIS — C50412 Malignant neoplasm of upper-outer quadrant of left female breast: Secondary | ICD-10-CM | POA: Diagnosis not present

## 2023-08-18 ENCOUNTER — Encounter: Payer: Self-pay | Admitting: Family Medicine

## 2023-08-18 DIAGNOSIS — R87610 Atypical squamous cells of undetermined significance on cytologic smear of cervix (ASC-US): Secondary | ICD-10-CM | POA: Diagnosis not present

## 2023-08-18 DIAGNOSIS — Z01419 Encounter for gynecological examination (general) (routine) without abnormal findings: Secondary | ICD-10-CM | POA: Diagnosis not present

## 2023-08-18 DIAGNOSIS — N760 Acute vaginitis: Secondary | ICD-10-CM | POA: Diagnosis not present

## 2023-08-18 DIAGNOSIS — R319 Hematuria, unspecified: Secondary | ICD-10-CM | POA: Diagnosis not present

## 2023-08-18 DIAGNOSIS — N898 Other specified noninflammatory disorders of vagina: Secondary | ICD-10-CM | POA: Diagnosis not present

## 2023-08-21 ENCOUNTER — Encounter: Payer: Self-pay | Admitting: Hematology and Oncology

## 2023-08-21 ENCOUNTER — Telehealth: Payer: Self-pay

## 2023-08-21 ENCOUNTER — Ambulatory Visit: Payer: BC Managed Care – PPO | Admitting: Family Medicine

## 2023-08-21 NOTE — Telephone Encounter (Signed)
 Called pt per MD to advise Guardant testing was negative/not detected. Pt verbalized understanding of results and knows [facility] will be in touch to schedule 6 mo and repeat labs.

## 2023-08-28 ENCOUNTER — Ambulatory Visit: Payer: BC Managed Care – PPO | Admitting: Family Medicine

## 2023-09-04 ENCOUNTER — Encounter: Payer: Self-pay | Admitting: Family Medicine

## 2023-09-04 ENCOUNTER — Ambulatory Visit (INDEPENDENT_AMBULATORY_CARE_PROVIDER_SITE_OTHER): Admitting: Family Medicine

## 2023-09-04 VITALS — BP 142/87 | HR 69 | Temp 97.9°F | Ht 65.0 in | Wt 163.0 lb

## 2023-09-04 DIAGNOSIS — R413 Other amnesia: Secondary | ICD-10-CM

## 2023-09-04 DIAGNOSIS — E7800 Pure hypercholesterolemia, unspecified: Secondary | ICD-10-CM

## 2023-09-04 DIAGNOSIS — Z79891 Long term (current) use of opiate analgesic: Secondary | ICD-10-CM | POA: Diagnosis not present

## 2023-09-04 DIAGNOSIS — Z0001 Encounter for general adult medical examination with abnormal findings: Secondary | ICD-10-CM

## 2023-09-04 DIAGNOSIS — Z79899 Other long term (current) drug therapy: Secondary | ICD-10-CM

## 2023-09-04 DIAGNOSIS — Z0000 Encounter for general adult medical examination without abnormal findings: Secondary | ICD-10-CM

## 2023-09-04 DIAGNOSIS — H8101 Meniere's disease, right ear: Secondary | ICD-10-CM | POA: Diagnosis not present

## 2023-09-04 DIAGNOSIS — M818 Other osteoporosis without current pathological fracture: Secondary | ICD-10-CM

## 2023-09-04 DIAGNOSIS — E559 Vitamin D deficiency, unspecified: Secondary | ICD-10-CM

## 2023-09-04 DIAGNOSIS — I700 Atherosclerosis of aorta: Secondary | ICD-10-CM | POA: Diagnosis not present

## 2023-09-04 LAB — LIPID PANEL

## 2023-09-04 MED ORDER — ROSUVASTATIN CALCIUM 10 MG PO TABS: 10.0000 mg | ORAL_TABLET | Freq: Every day | ORAL | 3 refills | Status: AC

## 2023-09-04 MED ORDER — VITAMIN D (ERGOCALCIFEROL) 1.25 MG (50000 UNIT) PO CAPS: ORAL_CAPSULE | ORAL | 3 refills | Status: AC

## 2023-09-04 MED ORDER — ALENDRONATE SODIUM 70 MG PO TABS: ORAL_TABLET | ORAL | 3 refills | Status: AC

## 2023-09-04 MED ORDER — DONEPEZIL HCL 10 MG PO TABS: 10.0000 mg | ORAL_TABLET | Freq: Every day | ORAL | 4 refills | Status: AC

## 2023-09-04 MED ORDER — CLONAZEPAM 0.5 MG PO TABS: ORAL_TABLET | ORAL | 5 refills | Status: DC

## 2023-09-04 MED ORDER — PAROXETINE HCL ER 25 MG PO TB24: 25.0000 mg | ORAL_TABLET | Freq: Every day | ORAL | 3 refills | Status: AC

## 2023-09-04 NOTE — Progress Notes (Signed)
 [name] is a 63 y.o. female presents to office today for annual physical exam examination.    Concerns today include: 1. None. Needs CPE for work  Substance use: none There are no preventive care reminders to display for this patient. Refills needed today: all  Immunization History  Administered Date(s) Administered   Influenza Inj Mdck Quad Pf 12/08/2018   Influenza,inj,Quad PF,6+ Mos 02/06/2014, 01/09/2016, 12/14/2017, 03/06/2020, 12/26/2020   Influenza-Unspecified 12/08/2018   Moderna Sars-Covid-2 Vaccination 04/05/2019, 05/03/2019, 01/11/2020   PNEUMOCOCCAL CONJUGATE-20 02/22/2021   Zoster Recombinant(Shingrix ) 09/03/2021, 03/05/2022   Past Medical History:  Diagnosis Date   Abnormal Pap smear of cervix 2002   Dr. Jerlene    Allergy 2012   seasonal   Anxiety    Arthritis    Benign hemangioma    Dr,  Joice    Breast cancer (HCC) 07/20/2014   Left Breast   Cervical stenosis (uterine cervix) 2002   Depression    Dizziness 1991   secondary to Meniere's disease   Hyperlipidemia 2007   Insomnia 1997   Meniere's disease 1991   Nervousness(799.21) 1987   Social History   Socioeconomic History   Marital status: Married    Spouse name: Cordella   Number of children: 2   Years of education: Not on file   Highest education level: Not on file  Occupational History   Not on file  Tobacco Use   Smoking status: Never   Smokeless tobacco: Never  Vaping Use   Vaping status: Never Used  Substance and Sexual Activity   Alcohol use: Not Currently    Comment: very rare   Drug use: No   Sexual activity: Yes    Birth control/protection: Surgical    Comment: ablation  Other Topics Concern   Not on file  Social History Narrative   Not on file   Social Drivers of Health   Financial Resource Strain: Not on file  Food Insecurity: No Food Insecurity (09/04/2023)   Hunger Vital Sign    Worried About Running Out of Food in the Last Year: Never true    Ran  Out of Food in the Last Year: Never true  Transportation Needs: Not on file  Physical Activity: Not on file  Stress: Not on file  Social Connections: Not on file  Intimate Partner Violence: Not At Risk (09/04/2023)   Humiliation, Afraid, Rape, and Kick questionnaire    Fear of Current or Ex-Partner: No    Emotionally Abused: No    Physically Abused: No    Sexually Abused: No   Past Surgical History:  Procedure Laterality Date   AXILLARY LYMPH NODE DISSECTION Left 08/16/2014   Procedure: AXILLARY LYMPH NODE DISSECTION;  Surgeon: Vicenta Poli, MD;  Location: [facility];  Service: General;  Laterality: Left;   bilateral  tubal ligation  05/08/2004   BREAST LUMPECTOMY WITH RADIOACTIVE SEED AND SENTINEL LYMPH NODE BIOPSY Left 08/08/2014   Procedure: BREAST LUMPECTOMY WITH RADIOACTIVE SEED AND SENTINEL LYMPH NODE BIOPSY;  Surgeon: Vicenta Poli, MD;  Location: [facility];  Service: General;  Laterality: Left;   BREAST REDUCTION SURGERY Right    CHOLECYSTECTOMY  12/30/2016   COLONOSCOPY  02/18/2018   Dr.Perry   FIDUCIAL MARKER PLACEMENT  04/01/2021   Procedure: fiducial dye marking;  Surgeon: Shelah Lamar RAMAN, MD;  Location: [facility];  Service: Pulmonary;;   INTERCOSTAL NERVE BLOCK Left 04/01/2021   Procedure: INTERCOSTAL NERVE BLOCK;  Surgeon: Kerrin Elspeth BROCKS, MD;  Location:  [facility];  Service: Thoracic;  Laterality: Left;   LYMPH NODE DISSECTION N/A 04/01/2021   Procedure: LYMPH NODE DISSECTION;  Surgeon: Kerrin Elspeth BROCKS, MD;  [facility];  Service: Thoracic;  Laterality: N/A;   MASS EXCISION Left 01/08/2018   Procedure: EXCISION MUCOID CYST, DEBRIDEMENT DISTAL INTERPHALANGEAL LEFT MIDDLE FINGER;  Surgeon: Murrell Kuba, MD;  Location: [facility];  Service: Orthopedics;  Laterality: Left;   MASTOPEXY Right 12/14/2015   Procedure: RIGHT BREAST MASTOPEXY;  Surgeon: Earlis Ranks, MD;  Location: [facility];   Service: Plastics;  Laterality: Right;   PORT-A-CATH REMOVAL N/A 03/08/2015   Procedure: REMOVAL PORT-A-CATH;  Surgeon: Vicenta Poli, MD;  Location: [facility];  Service: General;  Laterality: N/A;   PORTACATH PLACEMENT Right 08/16/2014   Procedure: INSERTION PORT-A-CATH;  Surgeon: Vicenta Poli, MD;  Location: [facility];  Service: General;  Laterality: Right;   RE-EXCISION OF BREAST LUMPECTOMY Left 08/16/2014   Procedure: RE-EXCISION OF LEFT BREAST CANCER AND PORT PLACEMENT/POSSIBLE AXILLARY DISECTION;  Surgeon: Vicenta Poli, MD;  Location: [facility];  Service: General;  Laterality: Left;   right breast surg     [facility]   SHOULDER OPEN ROTATOR CUFF REPAIR Right 07/28/2012   Procedure: RIGHT SHOULDER DECOMPRESSION AND EXCISION OF CALCIFIC DEPOSIT;  Surgeon: Tanda DELENA Heading, MD;  Location: [facility];  Service: Orthopedics;  Laterality: Right;   TUBAL LIGATION     uterine ablation  02/2006    Due to heavy periods Dr. Louvella    VIDEO BRONCHOSCOPY WITH RADIAL ENDOBRONCHIAL ULTRASOUND  04/01/2021   Procedure: VIDEO BRONCHOSCOPY WITH RADIAL ENDOBRONCHIAL ULTRASOUND;  Surgeon: Shelah Lamar RAMAN, MD;  Location: [facility];  Service: Pulmonary;;   wisdom teeth extractions  yrs ago   Family History  Problem Relation Age of Onset   Cirrhosis Father    Colon polyps Mother    Colon cancer Neg Hx    Esophageal cancer Neg Hx    Rectal cancer Neg Hx    Stomach cancer Neg Hx     Current Outpatient Medications:    acetaminophen  (TYLENOL ) 500 MG tablet, Take 1,500 mg by mouth daily as needed for moderate pain., Disp: , Rfl:    albuterol  (PROVENTIL ) (2.5 MG/3ML) 0.083% nebulizer solution, Take 3 mLs (2.5 mg total) by nebulization every 6 (six) hours as needed for wheezing or shortness of breath., Disp: 150 mL, Rfl: 1   albuterol  (VENTOLIN  HFA) 108 (90 Base) MCG/ACT inhaler, Inhale 1-2 puffs into the lungs every 6 (six) hours as needed for wheezing or shortness of  breath., Disp: 8.5 each, Rfl: 6   anastrozole  (ARIMIDEX ) 1 MG tablet, TAKE 1 TABLET BY MOUTH EVERY DAY, Disp: 90 tablet, Rfl: 3   Biotin 10000 MCG TABS, Take 10,000 mcg by mouth daily., Disp: , Rfl:    Calcium  Carb-Cholecalciferol (CALCIUM  600 + D PO), Take 1 tablet by mouth at bedtime., Disp: , Rfl:    clobetasol  cream (TEMOVATE ) 0.05 %, Apply 1 Application topically 2 (two) times daily as needed (irritation)., Disp: 30 g, Rfl: 0   furosemide  (LASIX ) 20 MG tablet, TAKE 1 TABLET BY MOUTH EVERY DAY, Disp: 30 tablet, Rfl: 3   hydrOXYzine  (ATARAX ) 25 MG tablet, Take 0.5-1 tablets (12.5-25 mg total) by mouth 3 (three) times daily as needed for itching., Disp: 30 tablet, Rfl: 0   meclizine  (ANTIVERT ) 25 MG tablet, Take 1 tablet (25 mg total) by mouth 3 (three) times daily as needed for dizziness., Disp: 30 tablet, Rfl:  1   potassium chloride  (KLOR-CON ) 10 MEQ tablet, Take 1 tablet (10 mEq total) by mouth daily as needed (when taking lasix )., Disp: 90 tablet, Rfl: 3   Semaglutide, 1 MG/DOSE, 2 MG/1.5ML SOPN, Inject into the skin. [facility] compounded, Disp: , Rfl:    triamcinolone  cream (KENALOG ) 0.1 %, Apply 1 application topically 2 (two) times daily as needed (irritation)., Disp: , Rfl:    Turmeric 500 MG TABS, Take 1 tablet by mouth 2 (two) times daily., Disp: , Rfl:    vitamin B-12 (CYANOCOBALAMIN) 1000 MCG tablet, Take 1,000 mcg by mouth at bedtime., Disp: , Rfl:    alendronate  (FOSAMAX ) 70 MG tablet, TAKE 1 TABLET ONCE A WEEK. TAKE WITH A FULL GLASS OF WATER  ON AN EMPTY STOMACH., Disp: 12 tablet, Rfl: 3   clonazePAM  (KLONOPIN ) 0.5 MG tablet, 1 tablet 3 times daily as needed for meniere's attack, Disp: 90 tablet, Rfl: 5   donepezil  (ARICEPT ) 10 MG tablet, Take 1 tablet (10 mg total) by mouth at bedtime., Disp: 90 tablet, Rfl: 4   PARoxetine  (PAXIL -CR) 25 MG 24 hr tablet, Take 1 tablet (25 mg total) by mouth daily., Disp: 90 tablet, Rfl: 3   rosuvastatin  (CRESTOR ) 10 MG tablet, Take 1 tablet (10  mg total) by mouth daily., Disp: 90 tablet, Rfl: 3   Vitamin D , Ergocalciferol , (DRISDOL ) 1.25 MG (50000 UNIT) CAPS capsule, TAKE 1 CAPSULE (50,000 UNITS TOTAL) BY MOUTH EVERY 7 (SEVEN) DAYS., Disp: 12 capsule, Rfl: 3  Allergies  Allergen Reactions   Statins Other (See Comments)    Muscle pain   Lipitor [Atorvastatin] Other (See Comments)    myalgias   Temazepam Other (See Comments)    Pt does not remember (Restoril)     ROS: Review of Systems Pertinent items noted in HPI and remainder of comprehensive ROS otherwise negative.    Physical exam BP (!) 142/87   Pulse 69   Temp 97.9 F (36.6 C)   Ht 5' 5 (1.651 m)   Wt 163 lb (73.9 kg)   SpO2 98%   BMI 27.12 kg/m  General appearance: alert, cooperative, appears stated age, and no distress Head: Normocephalic, without obvious abnormality, atraumatic Eyes: negative findings: lids and lashes normal, conjunctivae and sclerae normal, corneas clear, and pupils equal, round, reactive to light and accomodation Ears: normal TM's and external ear canals both ears Nose: Nares normal. Septum midline. Mucosa normal. No drainage or sinus tenderness. Throat: lips, mucosa, and tongue normal; teeth and gums normal Neck: no adenopathy, no carotid bruit, supple, symmetrical, trachea midline, and thyroid  not enlarged, symmetric, no tenderness/mass/nodules Back: symmetric, no curvature. ROM normal. No CVA tenderness. Lungs: clear to auscultation bilaterally Heart: regular rate and rhythm, S1, S2 normal, no murmur, click, rub or gallop Abdomen: soft, non-tender; bowel sounds normal; no masses,  no organomegaly Extremities: Warm, well-perfused.  No edema.  She has a bullae noted on the dorsal aspect of the right middle finger Pulses: 2+ and symmetric Skin: Burn as above.  Has a nonhealing lesion on the left dorsal aspect of the base of the thumb near the wrist and a similar lesion along the mid chest.  Multiple seborrheic keratoses and pigmented skin  lesions noted throughout Lymph nodes: Cervical, supraclavicular, and axillary nodes normal. Neurologic: Grossly normal      09/04/2023    3:06 PM 03/06/2023    3:23 PM 09/03/2022    8:48 AM  Depression screen PHQ 2/9  Decreased Interest 0 0 0  Down, Depressed, Hopeless  0 0 0  PHQ - 2 Score 0 0 0  Altered sleeping 0 0 1  Tired, decreased energy 0 0 0  Change in appetite 0 0 0  Feeling bad or failure about yourself  0 0 0  Trouble concentrating 0 0 0  Moving slowly or fidgety/restless 0 0 0  Suicidal thoughts 0 0   PHQ-9 Score 0 0 1  Difficult doing work/chores Not difficult at all Not difficult at all Not difficult at all      09/04/2023    3:06 PM 03/06/2023    3:23 PM 09/03/2022    8:49 AM 03/05/2022    8:05 AM  GAD 7 : Generalized Anxiety Score  Nervous, Anxious, on Edge 0 0 0 0  Control/stop worrying 0 0 0 0  Worry too much - different things 0 0 0 0  Trouble relaxing 0 0 0 0  Restless 0 0 0 0  Easily annoyed or irritable 0 0 0 0  Afraid - awful might happen 0 0 0 0  Total GAD 7 Score 0 0 0 0  Anxiety Difficulty    Not difficult at all     Assessment/ Plan: Jon GORMAN Carmin here for annual physical exam.   Annual physical exam  Meniere's disease of right ear - Plan: ToxASSURE Select 13 (MW), Urine, clonazePAM  (KLONOPIN ) 0.5 MG tablet, CMP14+EGFR, CBC, PARoxetine  (PAXIL -CR) 25 MG 24 hr tablet  Controlled substance agreement signed - Plan: ToxASSURE Select 13 (MW), Urine, CMP14+EGFR  Aortic atherosclerosis (HCC) - Plan: CMP14+EGFR, Lipid Panel, rosuvastatin  (CRESTOR ) 10 MG tablet  Pure hypercholesterolemia - Plan: CMP14+EGFR, Lipid Panel, TSH, rosuvastatin  (CRESTOR ) 10 MG tablet  Other osteoporosis without current pathological fracture - Plan: CMP14+EGFR, alendronate  (FOSAMAX ) 70 MG tablet, VITAMIN D  25 Hydroxy (Vit-D Deficiency, Fractures)  Vitamin D  deficiency - Plan: CMP14+EGFR, Vitamin D , Ergocalciferol , (DRISDOL ) 1.25 MG (50000 UNIT) CAPS capsule, VITAMIN D   25 Hydroxy (Vit-D Deficiency, Fractures)  Memory loss - Plan: donepezil  (ARICEPT ) 10 MG tablet  Mnire's disease is chronic and stable.  UDS and CSA were updated as per office policy and medications have been renewed  Nonfasting labs collected.  Continue statin for prevention of progression of aortic atherosclerosis  Continue Fosamax .  Check vitamin D , calcium , renal function  Aricept  renewed for chemo induced memory loss  Counseled on healthy lifestyle choices, including diet (rich in fruits, vegetables and lean meats and low in salt and simple carbohydrates) and exercise (at least 30 minutes of moderate physical activity daily).  Patient to follow up 56m  Brittanni Cariker M. Jolinda, DO

## 2023-09-05 ENCOUNTER — Ambulatory Visit: Payer: Self-pay | Admitting: Family Medicine

## 2023-09-05 ENCOUNTER — Encounter: Payer: Self-pay | Admitting: Hematology and Oncology

## 2023-09-05 LAB — CMP14+EGFR
ALT: 29 IU/L (ref 0–32)
AST: 24 IU/L (ref 0–40)
Albumin: 4.6 g/dL (ref 3.9–4.9)
Alkaline Phosphatase: 55 IU/L (ref 44–121)
BUN/Creatinine Ratio: 17 (ref 12–28)
BUN: 9 mg/dL (ref 8–27)
Bilirubin Total: 0.5 mg/dL (ref 0.0–1.2)
CO2: 20 mmol/L (ref 20–29)
Calcium: 9.1 mg/dL (ref 8.7–10.3)
Chloride: 104 mmol/L (ref 96–106)
Creatinine, Ser: 0.53 mg/dL — AB (ref 0.57–1.00)
Globulin, Total: 1.9 g/dL (ref 1.5–4.5)
Glucose: 87 mg/dL (ref 70–99)
Potassium: 3.6 mmol/L (ref 3.5–5.2)
Sodium: 142 mmol/L (ref 134–144)
Total Protein: 6.5 g/dL (ref 6.0–8.5)
eGFR: 105 mL/min/{1.73_m2} (ref >59)

## 2023-09-05 LAB — CBC
Hematocrit: 41.7 % (ref 34.0–46.6)
Hemoglobin: 14 g/dL (ref 11.1–15.9)
MCH: 32.9 pg (ref 26.6–33.0)
MCHC: 33.6 g/dL (ref 31.5–35.7)
MCV: 98 fL — ABNORMAL HIGH (ref 79–97)
Platelets: 211 10*3/uL (ref 150–450)
RBC: 4.26 x10E6/uL (ref 3.77–5.28)
RDW: 12.6 % (ref 11.7–15.4)
WBC: 5.7 10*3/uL (ref 3.4–10.8)

## 2023-09-05 LAB — LIPID PANEL
Cholesterol, Total: 145 mg/dL (ref 100–199)
HDL: 71 mg/dL (ref >39)
LDL CALC COMMENT:: 2 ratio (ref 0.0–4.4)
LDL Chol Calc (NIH): 55 mg/dL (ref 0–99)
Triglycerides: 108 mg/dL (ref 0–149)
VLDL Cholesterol Cal: 19 mg/dL (ref 5–40)

## 2023-09-05 LAB — VITAMIN D 25 HYDROXY (VIT D DEFICIENCY, FRACTURES): Vit D, 25-Hydroxy: 57.2 ng/mL (ref 30.0–100.0)

## 2023-09-05 LAB — TSH: TSH: 0.953 u[IU]/mL (ref 0.450–4.500)

## 2023-09-08 LAB — TOXASSURE SELECT 13 (MW), URINE

## 2023-10-15 ENCOUNTER — Encounter: Payer: Self-pay | Admitting: Emergency Medicine

## 2023-10-15 ENCOUNTER — Ambulatory Visit: Admitting: Emergency Medicine

## 2023-10-15 VITALS — BP 139/82 | HR 74 | Temp 97.8°F | Ht 65.0 in | Wt 164.2 lb

## 2023-10-15 DIAGNOSIS — Z853 Personal history of malignant neoplasm of breast: Secondary | ICD-10-CM

## 2023-10-15 DIAGNOSIS — J449 Chronic obstructive pulmonary disease, unspecified: Secondary | ICD-10-CM

## 2023-10-15 DIAGNOSIS — C3492 Malignant neoplasm of unspecified part of left bronchus or lung: Secondary | ICD-10-CM

## 2023-10-15 MED ORDER — SPIRIVA RESPIMAT 1.25 MCG/ACT IN AERS
INHALATION_SPRAY | RESPIRATORY_TRACT | Status: DC
Start: 2023-10-15 — End: 2023-11-11

## 2023-10-15 NOTE — Assessment & Plan Note (Signed)
 Mixed disease on her PFT with clinical response to albuterol  but no bronchodilator response on the study.  She is a never smoker but suspect she does have some degree of obstructive lung disease.  We will try Spiriva  to see if she gets benefit.  Follow-up in about 1 month to decide whether to stay on this.  She does have albuterol  and thinks she has benefited from it so I suspect that the bronchodilator will help her.

## 2023-10-15 NOTE — Patient Instructions (Addendum)
 I am glad that you have been doing well. We reviewed your CT scan from May today.  Please continue to follow-up with Dr. Gudena We will try starting a new inhaler medication called Spiriva .  Take 2 puffs once daily every day on a schedule.  Keep track of how this medication helps you so we can discuss it next time and decide whether to stay on it. Keep your albuterol  inhaler available so you can use 2 puffs if you need it for shortness of breath, chest tightness, wheezing.  This is a symptom triggered medication.  You do not have to take it on a schedule. Follow in our office in about 1 month so we can talk about how you are doing on the new inhaler. Follow with Dr. Shelah in 1 year.

## 2023-10-15 NOTE — Assessment & Plan Note (Signed)
 She has done well post surgical resection.  No evidence of recurrence on her imaging from May.  Followed by Dr. Gudena for this and also her breast cancer

## 2023-10-15 NOTE — Progress Notes (Signed)
Patient seen in the office today and instructed on use of Spiriva.  Patient expressed understanding and demonstrated technique. 

## 2023-10-15 NOTE — Progress Notes (Signed)
   Subjective:    Patient ID: [name], female    DOB: 1960-04-01, 63 y.o.   MRN: [id number]  HPI  ROV 10/15/2023--follow-up visit for 63 year old woman with a history of left breast cancer, well-differentiated adenocarcinoma post left lower lobectomy by Dr. Kerrin.  She is a never smoker, did have evidence for mixed obstruction and restriction on pulmonary function testing done in 2023. She has albuterol , uses it a couple times a day. She has had family tell her that they have seen her w SOB. She notices it in the heat, with gardening.   CT scan of the chest, abdomen, pelvis 07/14/2023 reviewed by me showed no evidence of metastatic disease in the chest abdomen or pelvis.  There is a 3 mm right middle lobe pulmonary nodule that was stable and can be considered benign.  No evidence of recurrent disease.   Review of Systems As per HPI      Objective:   Physical Exam  Vitals:   10/15/23 0908  BP: 139/82  Pulse: 74  Temp: 97.8 F (36.6 C)  TempSrc: Temporal  SpO2: 94%  Weight: 164 lb 3.2 oz (74.5 kg)  Height: 5' 5 (1.651 m)   Gen: Pleasant, well-nourished, in no distress,  normal affect  ENT: No lesions,  mouth clear,  oropharynx clear, no postnasal drip  Neck: No JVD, no stridor  Lungs: No use of accessory muscles, no crackles or wheezing on normal respiration, no wheeze on forced expiration  Cardiovascular: RRR, heart sounds normal, no murmur or gallops, no peripheral edema  Musculoskeletal: No deformities, no cyanosis or clubbing  Neuro: alert, awake, non focal  Skin: Warm, no lesions or rash     Assessment & Plan:   Adenocarcinoma, lung, left (HCC) She has done well post surgical resection.  No evidence of recurrence on her imaging from May.  Followed by Dr. Gudena for this and also her breast cancer  COPD (chronic obstructive pulmonary disease) (HCC) Mixed disease on her PFT with clinical response to albuterol  but no bronchodilator response on the  study.  She is a never smoker but suspect she does have some degree of obstructive lung disease.  We will try Spiriva  to see if she gets benefit.  Follow-up in about 1 month to decide whether to stay on this.  She does have albuterol  and thinks she has benefited from it so I suspect that the bronchodilator will help her.    Lamar Chris, MD, PhD 10/15/2023, 9:35 AM [facility] [phone] or if no answer before 7:00PM call [phone] For any issues after 7:00PM please call eLink [phone]

## 2023-10-15 NOTE — Addendum Note (Signed)
 Addended by: Mont Jagoda M on: 10/15/2023 09:46 AM   Modules accepted: Orders

## 2023-11-06 DIAGNOSIS — Z86018 Personal history of other benign neoplasm: Secondary | ICD-10-CM | POA: Diagnosis not present

## 2023-11-06 DIAGNOSIS — C44719 Basal cell carcinoma of skin of left lower limb, including hip: Secondary | ICD-10-CM | POA: Diagnosis not present

## 2023-11-06 DIAGNOSIS — L820 Inflamed seborrheic keratosis: Secondary | ICD-10-CM | POA: Diagnosis not present

## 2023-11-06 DIAGNOSIS — D225 Melanocytic nevi of trunk: Secondary | ICD-10-CM | POA: Diagnosis not present

## 2023-11-06 DIAGNOSIS — D485 Neoplasm of uncertain behavior of skin: Secondary | ICD-10-CM | POA: Diagnosis not present

## 2023-11-06 DIAGNOSIS — L859 Epidermal thickening, unspecified: Secondary | ICD-10-CM | POA: Diagnosis not present

## 2023-11-06 DIAGNOSIS — L578 Other skin changes due to chronic exposure to nonionizing radiation: Secondary | ICD-10-CM | POA: Diagnosis not present

## 2023-11-06 DIAGNOSIS — L821 Other seborrheic keratosis: Secondary | ICD-10-CM | POA: Diagnosis not present

## 2023-11-06 DIAGNOSIS — H61002 Unspecified perichondritis of left external ear: Secondary | ICD-10-CM | POA: Diagnosis not present

## 2023-11-09 ENCOUNTER — Encounter: Payer: Self-pay | Admitting: Emergency Medicine

## 2023-11-10 NOTE — Telephone Encounter (Signed)
 Is spiriva  okay to refill?

## 2023-11-10 NOTE — Telephone Encounter (Signed)
 Yes. This is ok.

## 2023-11-11 ENCOUNTER — Other Ambulatory Visit: Payer: Self-pay | Admitting: Emergency Medicine

## 2023-11-11 MED ORDER — SPIRIVA RESPIMAT 1.25 MCG/ACT IN AERS: INHALATION_SPRAY | RESPIRATORY_TRACT | 6 refills | Status: DC

## 2023-11-20 ENCOUNTER — Ambulatory Visit: Admitting: Nurse Practitioner

## 2023-12-11 DIAGNOSIS — C44719 Basal cell carcinoma of skin of left lower limb, including hip: Secondary | ICD-10-CM | POA: Diagnosis not present

## 2023-12-11 DIAGNOSIS — M67449 Ganglion, unspecified hand: Secondary | ICD-10-CM | POA: Diagnosis not present

## 2023-12-16 ENCOUNTER — Other Ambulatory Visit: Payer: Self-pay | Admitting: Hematology and Oncology

## 2023-12-16 DIAGNOSIS — C50412 Malignant neoplasm of upper-outer quadrant of left female breast: Secondary | ICD-10-CM

## 2024-01-06 ENCOUNTER — Encounter: Payer: Self-pay | Admitting: *Deleted

## 2024-01-06 NOTE — Progress Notes (Signed)
 Guardant reveal renewal orders placed via their portal.

## 2024-01-25 ENCOUNTER — Other Ambulatory Visit: Payer: Self-pay | Admitting: Hematology and Oncology

## 2024-01-25 MED ORDER — AMOXICILLIN-POT CLAVULANATE 875-125 MG PO TABS: 1.0000 | ORAL_TABLET | Freq: Two times a day (BID) | ORAL | 0 refills | Status: DC

## 2024-02-02 ENCOUNTER — Inpatient Hospital Stay: Attending: Hematology and Oncology | Admitting: Hematology and Oncology

## 2024-02-02 VITALS — BP 132/78 | HR 70 | Temp 98.7°F | Resp 17 | Wt 169.0 lb

## 2024-02-02 DIAGNOSIS — Z170 Estrogen receptor positive status [ER+]: Secondary | ICD-10-CM

## 2024-02-02 DIAGNOSIS — Z888 Allergy status to other drugs, medicaments and biological substances status: Secondary | ICD-10-CM | POA: Diagnosis not present

## 2024-02-02 DIAGNOSIS — Z510 Encounter for antineoplastic radiation therapy: Secondary | ICD-10-CM | POA: Diagnosis not present

## 2024-02-02 DIAGNOSIS — M810 Age-related osteoporosis without current pathological fracture: Secondary | ICD-10-CM | POA: Insufficient documentation

## 2024-02-02 DIAGNOSIS — Z7983 Long term (current) use of bisphosphonates: Secondary | ICD-10-CM | POA: Diagnosis not present

## 2024-02-02 DIAGNOSIS — M791 Myalgia, unspecified site: Secondary | ICD-10-CM | POA: Insufficient documentation

## 2024-02-02 DIAGNOSIS — M2550 Pain in unspecified joint: Secondary | ICD-10-CM | POA: Diagnosis not present

## 2024-02-02 DIAGNOSIS — Z853 Personal history of malignant neoplasm of breast: Secondary | ICD-10-CM | POA: Insufficient documentation

## 2024-02-02 DIAGNOSIS — Z79811 Long term (current) use of aromatase inhibitors: Secondary | ICD-10-CM | POA: Diagnosis not present

## 2024-02-02 DIAGNOSIS — Z79899 Other long term (current) drug therapy: Secondary | ICD-10-CM | POA: Diagnosis not present

## 2024-02-02 DIAGNOSIS — C50412 Malignant neoplasm of upper-outer quadrant of left female breast: Secondary | ICD-10-CM | POA: Diagnosis not present

## 2024-02-02 DIAGNOSIS — R609 Edema, unspecified: Secondary | ICD-10-CM | POA: Insufficient documentation

## 2024-02-02 DIAGNOSIS — Z923 Personal history of irradiation: Secondary | ICD-10-CM | POA: Diagnosis not present

## 2024-02-02 DIAGNOSIS — R635 Abnormal weight gain: Secondary | ICD-10-CM | POA: Diagnosis not present

## 2024-02-02 NOTE — Progress Notes (Signed)
 Patient Care Team: Jolinda Norene HERO, DO as PCP - General (Family Medicine) Odean Potts, MD as Consulting Physician (Hematology and Oncology) Vernetta Berg, MD as Consulting Physician (General Surgery) Dewey Rush, MD as Consulting Physician (Radiation Oncology) Moses Powell Hummer, NP as Nurse Practitioner (Hematology and Oncology)  DIAGNOSIS:  Encounter Diagnosis  Name Primary?   Malignant neoplasm of upper-outer quadrant of left breast in female, estrogen receptor positive (HCC) Yes    SUMMARY OF ONCOLOGIC HISTORY: Oncology History  Malignant neoplasm of upper-outer quadrant of left breast in female, estrogen receptor positive (HCC)  07/18/2014 Mammogram   Distortion left breast, breast density category C; U/S 1.3 x 0.8 x 0.8 cm left breast mass at 2:00 position 4 cm from nipple, no lymph nodes   07/20/2014 Initial Diagnosis   Left breast Biopsy: Invasive lobular cancer with LCIS, Grade 1, ER 86%, PR 78%, Her 2 Neg Ratio 1.77, Ki 67: 14%   08/01/2014 Breast MRI   Breast MRI showed non-mass enhancement 3.9 cm, no lymph nodes   08/01/2014 Clinical Stage   Stage IIA: T2 N0   08/08/2014 Surgery   Left breast lumpectomy: 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, with LCIS, medial and inferior margins positive, 3/4 lymph nodes positive   08/08/2014 Pathologic Stage   Stage IIB: T2 N1c M0   08/16/2014 Surgery   Left breast medial margin reexcision residual ILC 0.2 cm; inferior margin residual foci less than 0.2 cm, 1/5 lymph nodes positive, 1 lymph node with isolated tumor cells (Overall 4/10)   09/04/2014 - 01/15/2015 Chemotherapy   Adjuvant chemotherapy with dose dense Adriamycin  and Cytoxan  4 followed by Abraxane  weekly 8 ( stopped early for profound neutropenia and thrombocytopenia)   01/29/2015 - 03/20/2015 Radiation Therapy   Adjuvant XRT [facility]): 50.4 Gy over 28 fractions; seroma boost: 10 Gy over 5 fractions. Total dose: 60.4 Gy   04/11/2015 -  Anti-estrogen oral  therapy   Anastrozole  1 mg daily (PALLAS clinical trial 05/24/2015 patient was randomized to hormone therapy alone)   05/24/2015 Survivorship   Survivorship visit completed and copy of care plan given to patient   11/26/2016 PET scan   Small Left sided level 2 LN with hypermetabolism, post surg changes in breast and small right groin lymph node likely infectious/inflammatory, gallstones     CHIEF COMPLIANT: Surveillance of breast cancer  HISTORY OF PRESENT ILLNESS:   History of Present Illness [name] is a 63 year old female who presents for a routine follow-up visit.  She reports no new problems or concerns.  Denies any lumps or nodules in the breast.  She is complaining of fluid retention for which she has Lasix  but she is not sure how much to take and for how long. Guardant reveal has been negative.     ALLERGIES:  is allergic to statins, lipitor [atorvastatin], and temazepam.  MEDICATIONS:  Current Outpatient Medications  Medication Sig Dispense Refill   acetaminophen  (TYLENOL ) 500 MG tablet Take 1,500 mg by mouth daily as needed for moderate pain.     albuterol  (PROVENTIL ) (2.5 MG/3ML) 0.083% nebulizer solution Take 3 mLs (2.5 mg total) by nebulization every 6 (six) hours as needed for wheezing or shortness of breath. 150 mL 1   albuterol  (VENTOLIN  HFA) 108 (90 Base) MCG/ACT inhaler Inhale 1-2 puffs into the lungs every 6 (six) hours as needed for wheezing or shortness of breath. 8.5 each 6   alendronate  (FOSAMAX ) 70 MG tablet TAKE 1 TABLET ONCE A WEEK. TAKE WITH A  FULL GLASS OF WATER  ON AN EMPTY STOMACH. 12 tablet 3   anastrozole  (ARIMIDEX ) 1 MG tablet TAKE 1 TABLET BY MOUTH EVERY DAY 90 tablet 2   Biotin 89999 MCG TABS Take 10,000 mcg by mouth daily.     Calcium  Carb-Cholecalciferol (CALCIUM  600 + D PO) Take 1 tablet by mouth at bedtime.     clobetasol  cream (TEMOVATE ) 0.05 % Apply 1 Application topically 2 (two) times daily as needed (irritation). 30 g 0   clonazePAM   (KLONOPIN ) 0.5 MG tablet 1 tablet 3 times daily as needed for meniere's attack 90 tablet 5   donepezil  (ARICEPT ) 10 MG tablet Take 1 tablet (10 mg total) by mouth at bedtime. 90 tablet 4   furosemide  (LASIX ) 20 MG tablet TAKE 1 TABLET BY MOUTH EVERY DAY 30 tablet 3   hydrOXYzine  (ATARAX ) 25 MG tablet Take 0.5-1 tablets (12.5-25 mg total) by mouth 3 (three) times daily as needed for itching. 30 tablet 0   meclizine  (ANTIVERT ) 25 MG tablet Take 1 tablet (25 mg total) by mouth 3 (three) times daily as needed for dizziness. 30 tablet 1   PARoxetine  (PAXIL -CR) 25 MG 24 hr tablet Take 1 tablet (25 mg total) by mouth daily. 90 tablet 3   potassium chloride  (KLOR-CON ) 10 MEQ tablet Take 1 tablet (10 mEq total) by mouth daily as needed (when taking lasix ). 90 tablet 3   rosuvastatin  (CRESTOR ) 10 MG tablet Take 1 tablet (10 mg total) by mouth daily. 90 tablet 3   Tiotropium Bromide Monohydrate  (SPIRIVA  RESPIMAT) 1.25 MCG/ACT AERS X2 SAMPLES GIVEN 4 g 6   triamcinolone  cream (KENALOG ) 0.1 % Apply 1 application topically 2 (two) times daily as needed (irritation).     Turmeric 500 MG TABS Take 1 tablet by mouth 2 (two) times daily.     Vitamin D , Ergocalciferol , (DRISDOL ) 1.25 MG (50000 UNIT) CAPS capsule TAKE 1 CAPSULE (50,000 UNITS TOTAL) BY MOUTH EVERY 7 (SEVEN) DAYS. 12 capsule 3   vitamin B-12 (CYANOCOBALAMIN) 1000 MCG tablet Take 1,000 mcg by mouth at bedtime.     No current facility-administered medications for this visit.    PHYSICAL EXAMINATION: ECOG PERFORMANCE STATUS: 1 - Symptomatic but completely ambulatory  Vitals:   02/02/24 0813  BP: 132/78  Pulse: 70  Resp: 17  Temp: 98.7 F (37.1 C)  SpO2: 98%   Filed Weights   02/02/24 0813  Weight: 169 lb (76.7 kg)    Physical Exam Breast exam: No palpable lumps or nodules in bilateral breasts or axilla  (exam performed in the presence of a chaperone)  LABORATORY DATA:  I have reviewed the data as listed    Latest Ref Rng & Units  09/04/2023    3:36 PM 01/21/2023    7:48 AM 07/16/2022    1:19 PM  CMP  Glucose 70 - 99 mg/dL 87  897    BUN 8 - 27 mg/dL 9  10    Creatinine 9.42 - 1.00 mg/dL 9.46  9.46  9.49   Sodium 134 - 144 mmol/L 142  142    Potassium 3.5 - 5.2 mmol/L 3.6  3.8    Chloride 96 - 106 mmol/L 104  106    CO2 20 - 29 mmol/L 20  30    Calcium  8.7 - 10.3 mg/dL 9.1  8.9    Total Protein 6.0 - 8.5 g/dL 6.5  6.7    Total Bilirubin 0.0 - 1.2 mg/dL 0.5  0.8    Alkaline Phos 44 - 121 IU/L  55  50    AST 0 - 40 IU/L 24  16    ALT 0 - 32 IU/L 29  19      Lab Results  Component Value Date   WBC 5.7 09/04/2023   HGB 14.0 09/04/2023   HCT 41.7 09/04/2023   MCV 98 (H) 09/04/2023   PLT 211 09/04/2023   NEUTROABS 2.5 01/21/2023    ASSESSMENT & PLAN:  Malignant neoplasm of upper-outer quadrant of left breast in female, estrogen receptor positive (HCC) Left breast lumpectomy 08/08/2014: 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 Adjuvant chemotherapy with dose dense Adriamycin  and Cytoxan  x 4 followed by Abraxane  weekly 12 because of lymph node positive disease. Started 09/04/14 to 01/15/15 2. adjuvant radiation completed 03/20/15 3. Followed by antiestrogen therapy started 04/03/15 -------------------------------------------------------------------------------------------------------------------------- Anastrozole  toxicities: Patient denies any hot flashes. Weight gain Arthralgias and myalgias    PALLAS Trial : Patient has been randomized to anastrozole  alone. ABC clinical trial: The trial randomizes between aspirin  versus placebo. She went on  trial 07/26/2015 Currently on 100 mg of dosage.  No adverse effects from participating in the trial.   Adverse effects: 1. Bruising/ Hematoma:  Currently taking 100 mg of aspirin /placebo: Mild 2. Osteoporosis: Fosamax  weekly, DEXA scan 07/28/2019: T score -2.4    Cognitive dysfunction: On Aricept  10 mg daily, followed by PCP, doing much better   Breast cancer surveillance:  1. Mammogram 08/06/2021 benign 2. Breast exam 02/02/2024: No palpable lumps or nodules of concern.  3.  Guardant reveal May 2025: 0%   Bone density 07/26/2015: T score -1.9, osteopenia Bone density: 07/28/19 T score -2.1 currently on Fosamax    No clinical evidence of breast cancer recurrence.   03/08/2021: PET CT scan: 9 mm posterior left lower lobe nodule 04/01/2021: Left lower lobe wedge resection (Dr. 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 CT CAP 07/14/2023: Stable  Return to clinic in 6 months for follow-up     No orders of the defined types were placed in this encounter.  The patient has a good understanding of the overall plan. she agrees with it. she will call with any problems that may develop before the next visit here.  I personally spent a total of 30 minutes in the care of the patient today including preparing to see the patient, getting/reviewing separately obtained history, performing a medically appropriate exam/evaluation, counseling and educating, placing orders, referring and communicating with other health care professionals, documenting clinical information in the EHR, independently interpreting results, communicating results, and coordinating care.   Viinay K Maveric Debono, MD 02/02/24

## 2024-02-02 NOTE — Assessment & Plan Note (Signed)
 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 Adjuvant chemotherapy with dose dense Adriamycin  and Cytoxan  x 4 followed by Abraxane  weekly 12 because of lymph node positive disease. Started 09/04/14 to 01/15/15 2. adjuvant radiation completed 03/20/15 3. Followed by antiestrogen therapy started 04/03/15 -------------------------------------------------------------------------------------------------------------------------- Anastrozole  toxicities: Patient denies any hot flashes. Weight gain Arthralgias and myalgias    PALLAS Trial : Patient has been randomized to anastrozole  alone. ABC clinical trial: The trial randomizes between aspirin  versus placebo. She went on trial 07/26/2015 Currently on 100 mg of dosage.  No adverse effects from participating in the trial.   Adverse effects: 1. Bruising/ Hematoma:  Currently taking 100 mg of aspirin /placebo: Mild 2. Osteoporosis: Fosamax  weekly, DEXA scan 07/28/2019: T score -2.4    Cognitive dysfunction: On Aricept  10 mg daily, followed by PCP, doing much better   Breast cancer surveillance:  1. Mammogram 08/06/2021 benign 2. Breast exam 02/02/2024: No palpable lumps or nodules of concern.  3.  Guardant reveal May 2025: 0%   Bone density 07/26/2015: T score -1.9, osteopenia Bone density: 07/28/19 T score -2.1 currently on Fosamax    No clinical evidence of breast cancer recurrence.   03/08/2021: PET CT scan: 9 mm posterior left lower lobe nodule 04/01/2021: Left lower lobe wedge resection (Dr. 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 CT CAP 07/14/2023: Stable  Return to clinic in 1 year for follow-up

## 2024-02-07 ENCOUNTER — Other Ambulatory Visit: Payer: Self-pay | Admitting: Hematology and Oncology

## 2024-02-08 ENCOUNTER — Encounter: Payer: Self-pay | Admitting: Hematology and Oncology

## 2024-02-09 ENCOUNTER — Encounter: Payer: Self-pay | Admitting: *Deleted

## 2024-02-09 ENCOUNTER — Other Ambulatory Visit: Payer: Self-pay | Admitting: Hematology and Oncology

## 2024-02-09 MED ORDER — POTASSIUM CHLORIDE CRYS ER 20 MEQ PO TBCR: 20.0000 meq | EXTENDED_RELEASE_TABLET | Freq: Every day | ORAL | 2 refills | Status: AC

## 2024-02-11 ENCOUNTER — Encounter: Payer: Self-pay | Admitting: Hematology and Oncology

## 2024-03-04 ENCOUNTER — Ambulatory Visit (INDEPENDENT_AMBULATORY_CARE_PROVIDER_SITE_OTHER): Payer: Self-pay | Admitting: Family Medicine

## 2024-03-04 ENCOUNTER — Encounter: Payer: Self-pay | Admitting: Family Medicine

## 2024-03-04 VITALS — BP 160/82 | HR 73 | Temp 98.2°F | Ht 65.0 in | Wt 169.2 lb

## 2024-03-04 DIAGNOSIS — H66003 Acute suppurative otitis media without spontaneous rupture of ear drum, bilateral: Secondary | ICD-10-CM

## 2024-03-04 DIAGNOSIS — H8101 Meniere's disease, right ear: Secondary | ICD-10-CM

## 2024-03-04 DIAGNOSIS — N898 Other specified noninflammatory disorders of vagina: Secondary | ICD-10-CM | POA: Diagnosis not present

## 2024-03-04 DIAGNOSIS — I10 Essential (primary) hypertension: Secondary | ICD-10-CM | POA: Diagnosis not present

## 2024-03-04 LAB — WET PREP FOR TRICH, YEAST, CLUE
Clue Cell Exam: NEGATIVE
Trichomonas Exam: NEGATIVE
Yeast Exam: NEGATIVE

## 2024-03-04 MED ORDER — CLONAZEPAM 0.5 MG PO TABS: ORAL_TABLET | ORAL | 5 refills | Status: AC

## 2024-03-04 MED ORDER — HYDROCHLOROTHIAZIDE 25 MG PO TABS: 25.0000 mg | ORAL_TABLET | Freq: Every day | ORAL | 3 refills | Status: DC

## 2024-03-04 MED ORDER — FLUCONAZOLE 150 MG PO TABS: 150.0000 mg | ORAL_TABLET | Freq: Once | ORAL | 0 refills | Status: AC

## 2024-03-04 MED ORDER — AMOXICILLIN-POT CLAVULANATE 875-125 MG PO TABS: 1.0000 | ORAL_TABLET | Freq: Two times a day (BID) | ORAL | 0 refills | Status: DC

## 2024-03-04 NOTE — Progress Notes (Signed)
 "  Subjective: CC: Here for interval follow-up on Mnire's disease PCP: Jolinda Norene HERO, DO YEP:[name] is a 63 y.o. female presenting to clinic today for:  She reports that she has been doing well.  She has been feeling a little swimmy headed and wonders if maybe she has an inner ear infection.  She is noted some drainage but denies any fevers, pain.  Balance seems to be little off.  Utilizing benzodiazepine regularly for maintenance and this has been working well and she voices no concerns with regards to that.  She does report frustration with fluid retention and weight.  She has a low-salt, healthy diet and stays physically active but cannot get rid of weight along her midsection.  ROS: Per HPI  Allergies[1] Past Medical History:  Diagnosis Date   Abnormal Pap smear of cervix 2002   Dr. 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   Allergy 2012   seasonal   Anxiety    Arthritis    Benign hemangioma    Dr,  Joice    Breast cancer (HCC) 07/20/2014   Left Breast   Cervical stenosis (uterine cervix) 2002   Depression    Dizziness 1991   secondary to Meniere's disease   Hyperlipidemia 2007   Insomnia 1997   Meniere's disease 1991   Nervousness(799.21) 1987   Current Medications[2] Social History   Socioeconomic History   Marital status: Married    Spouse name: Cordella   Number of children: 2   Years of education: Not on file   Highest education level: Not on file  Occupational History   Not on file  Tobacco Use   Smoking status: Never   Smokeless tobacco: Never  Vaping Use   Vaping status: Never Used  Substance and Sexual Activity   Alcohol use: Not Currently    Comment: very rare   Drug use: No   Sexual activity: Yes    Birth control/protection: Surgical    Comment: ablation  Other Topics Concern   Not on file  Social History Narrative   Not on file   Social Drivers of Health   Tobacco Use: Low Risk (10/15/2023)   Patient  History    Smoking Tobacco Use: Never    Smokeless Tobacco Use: Never    Passive Exposure: Not on file  Financial Resource Strain: Not on file  Food Insecurity: No Food Insecurity (09/04/2023)   Epic    Worried About Programme Researcher, Broadcasting/film/video in the Last Year: Never true    Ran Out of Food in the Last Year: Never true  Transportation Needs: Not on file  Physical Activity: Not on file  Stress: Not on file  Social Connections: Not on file  Intimate Partner Violence: Not At Risk (09/04/2023)   Epic    Fear of Current or Ex-Partner: No    Emotionally Abused: No    Physically Abused: No    Sexually Abused: No  Depression (PHQ2-9): Low Risk (09/04/2023)   Depression (PHQ2-9)    PHQ-2 Score: 0  Alcohol Screen: Not on file  Housing: Low Risk (09/04/2023)   Epic    Unable to Pay for Housing in the Last Year: No    Number of Times Moved in the Last Year: 0    Homeless in the Last Year: No  Utilities: Not on file  Health Literacy: Not on file   Family History  Problem Relation Age of Onset   Cirrhosis Father    Colon  polyps Mother    Colon cancer Neg Hx    Esophageal cancer Neg Hx    Rectal cancer Neg Hx    Stomach cancer Neg Hx     Objective: Office vital signs reviewed. BP (!) 160/82   Pulse 73   Temp 98.2 F (36.8 C)   Ht 5' 5 (1.651 m)   Wt 169 lb 4 oz (76.8 kg)   SpO2 95%   BMI 28.16 kg/m   Physical Examination:  General: Awake, alert, well appearing female, No acute distress HEENT: sclera white, MMM.  TMs intact bilaterally.  Slightly milky effusion on the right but more prominent on the left with some bulging of the TM noted.  No significant erythema.  No perforation Cardio: regular rate and rhythm, S1S2 heard, no murmurs appreciated Pulm: clear to auscultation bilaterally, no wheezes, rhonchi or rales; normal work of breathing on room air Extremities: no edema  Assessment/ Plan: 63 y.o. female   Meniere's disease of right ear - Plan: clonazePAM  (KLONOPIN ) 0.5 MG  tablet  Non-recurrent acute suppurative otitis media of both ears without spontaneous rupture of tympanic membranes - Plan: amoxicillin -clavulanate (AUGMENTIN ) 875-125 MG tablet, fluconazole  (DIFLUCAN ) 150 MG tablet  Vaginal odor - Plan: WET PREP FOR TRICH, YEAST, CLUE  Essential hypertension - Plan: hydrochlorothiazide  (HYDRODIURIL ) 25 MG tablet   Mnire's is stable.  UDS and CSA are up-to-date and the national narcotic database reviewed with no red flags.  Klonopin  renewed.  Augmentin  sent for otitis media.  Diflucan  sent for as needed use.  Wet prep demonstrated no abnormalities concerning for bacterial vaginosis at this time  Blood pressure not controlled and given reports of concerns for fluid retention in the abdominal section HCTZ added.  Discussed goal blood pressure of less than 140/90 and had a monitor blood pressures appropriately.  She will contact me the next couple of weeks with blood pressure results and we will go from there.  Okay to hold Lasix  while taking this new medication  Mathayus Stanbery M Chardai Gangemi, DO [facility] [phone]     [1]  Allergies Allergen Reactions   Statins Other (See Comments)    Muscle pain   Lipitor [Atorvastatin] Other (See Comments)    myalgias   Temazepam Other (See Comments)    Pt does not remember (Restoril)  [2]  Current Outpatient Medications:    acetaminophen  (TYLENOL ) 500 MG tablet, Take 1,500 mg by mouth daily as needed for moderate pain., Disp: , Rfl:    albuterol  (PROVENTIL ) (2.5 MG/3ML) 0.083% nebulizer solution, Take 3 mLs (2.5 mg total) by nebulization every 6 (six) hours as needed for wheezing or shortness of breath., Disp: 150 mL, Rfl: 1   albuterol  (VENTOLIN  HFA) 108 (90 Base) MCG/ACT inhaler, Inhale 1-2 puffs into the lungs every 6 (six) hours as needed for wheezing or shortness of breath., Disp: 8.5 each, Rfl: 6   alendronate  (FOSAMAX ) 70 MG tablet, TAKE 1 TABLET ONCE A WEEK. TAKE WITH A FULL GLASS OF  WATER  ON AN EMPTY STOMACH., Disp: 12 tablet, Rfl: 3   anastrozole  (ARIMIDEX ) 1 MG tablet, TAKE 1 TABLET BY MOUTH EVERY DAY, Disp: 90 tablet, Rfl: 2   Biotin 10000 MCG TABS, Take 10,000 mcg by mouth daily., Disp: , Rfl:    Calcium  Carb-Cholecalciferol (CALCIUM  600 + D PO), Take 1 tablet by mouth at bedtime., Disp: , Rfl:    clobetasol  cream (TEMOVATE ) 0.05 %, Apply 1 Application topically 2 (two) times daily as needed (irritation)., Disp: 30 g, Rfl:  0   clonazePAM  (KLONOPIN ) 0.5 MG tablet, 1 tablet 3 times daily as needed for meniere's attack, Disp: 90 tablet, Rfl: 5   donepezil  (ARICEPT ) 10 MG tablet, Take 1 tablet (10 mg total) by mouth at bedtime., Disp: 90 tablet, Rfl: 4   furosemide  (LASIX ) 20 MG tablet, TAKE 1 TABLET BY MOUTH EVERY DAY, Disp: 30 tablet, Rfl: 3   hydrOXYzine  (ATARAX ) 25 MG tablet, Take 0.5-1 tablets (12.5-25 mg total) by mouth 3 (three) times daily as needed for itching., Disp: 30 tablet, Rfl: 0   meclizine  (ANTIVERT ) 25 MG tablet, Take 1 tablet (25 mg total) by mouth 3 (three) times daily as needed for dizziness., Disp: 30 tablet, Rfl: 1   PARoxetine  (PAXIL -CR) 25 MG 24 hr tablet, Take 1 tablet (25 mg total) by mouth daily., Disp: 90 tablet, Rfl: 3   potassium chloride  (KLOR-CON ) 10 MEQ tablet, Take 1 tablet (10 mEq total) by mouth daily as needed (when taking lasix )., Disp: 90 tablet, Rfl: 3   potassium chloride  SA (KLOR-CON  M) 20 MEQ tablet, Take 1 tablet (20 mEq total) by mouth daily., Disp: 30 tablet, Rfl: 2   rosuvastatin  (CRESTOR ) 10 MG tablet, Take 1 tablet (10 mg total) by mouth daily., Disp: 90 tablet, Rfl: 3   Tiotropium Bromide Monohydrate  (SPIRIVA  RESPIMAT) 1.25 MCG/ACT AERS, X2 SAMPLES GIVEN, Disp: 4 g, Rfl: 6   triamcinolone  cream (KENALOG ) 0.1 %, Apply 1 application topically 2 (two) times daily as needed (irritation)., Disp: , Rfl:    Turmeric 500 MG TABS, Take 1 tablet by mouth 2 (two) times daily., Disp: , Rfl:    vitamin B-12 (CYANOCOBALAMIN) 1000 MCG tablet,  Take 1,000 mcg by mouth at bedtime., Disp: , Rfl:    Vitamin D , Ergocalciferol , (DRISDOL ) 1.25 MG (50000 UNIT) CAPS capsule, TAKE 1 CAPSULE (50,000 UNITS TOTAL) BY MOUTH EVERY 7 (SEVEN) DAYS., Disp: 12 capsule, Rfl: 3  "

## 2024-03-04 NOTE — Patient Instructions (Signed)
 Goal is LESS than 140/90   How to Take Your Blood Pressure Blood pressure measures how strongly your blood is pressing against the walls of your arteries. Arteries are blood vessels that carry blood from your heart throughout your body. You can take your blood pressure at home with a machine. You may need to check your blood pressure at home: To check if you have high blood pressure (hypertension). To check your blood pressure over time. To make sure your blood pressure medicine is working. Supplies needed: Blood pressure machine, or monitor. A chair to sit in. This should be a chair where you can sit upright with your back supported. Do not sit on a soft couch or an armchair. Table or desk. Small notebook. Pencil or pen. How to prepare Avoid these things for 30 minutes before checking your blood pressure: Having drinks with caffeine in them, such as coffee or tea. Drinking alcohol. Eating. Smoking. Exercising. Do these things five minutes before checking your blood pressure: Go to the bathroom and pee (urinate). Sit in a chair. Be quiet. Do not talk. How to take your blood pressure Follow the instructions that came with your machine. If you have a digital blood pressure monitor, these may be the instructions: Sit up straight. Place your feet on the floor. Do not cross your ankles or legs. Rest your left arm at the level of your heart. You may rest it on a table, desk, or chair. Pull up your shirt sleeve. Wrap the blood pressure cuff around the upper part of your left arm. The cuff should be 1 inch (2.5 cm) above your elbow. It is best to wrap the cuff around bare skin. Fit the cuff snugly around your arm, but not too tightly. You should be able to place only one finger between the cuff and your arm. Place the cord so that it rests in the bend of your elbow. Press the power button. Sit quietly while the cuff fills with air and loses air. Write down the numbers on the screen. Wait  2-3 minutes and then repeat steps 1-10. What do the numbers mean? Two numbers make up your blood pressure. The first number is called systolic pressure. The second is called diastolic pressure. An example of a blood pressure reading is 120 over 80 (or 120/80). If you are an adult and do not have a medical condition, use this guide to find out if your blood pressure is normal: Normal First number: below 120. Second number: below 80. Elevated First number: 120-129. Second number: below 80. Hypertension stage 1 First number: 130-139. Second number: 80-89. Hypertension stage 2 First number: 140 or above. Second number: 90 or above. Your blood pressure is above normal even if only the first or only the second number is above normal. Follow these instructions at home: Medicines Take over-the-counter and prescription medicines only as told by your doctor. Tell your doctor if your medicine is causing side effects. General instructions Check your blood pressure as often as your doctor tells you to. Check your blood pressure at the same time every day. Take your monitor to your next doctor's appointment. Your doctor will: Make sure you are using it correctly. Make sure it is working right. Understand what your blood pressure numbers should be. Keep all follow-up visits. General tips You will need a blood pressure machine or monitor. Your doctor can suggest a monitor. You can buy one at a drugstore or online. When choosing one: Choose one with an arm  cuff. Choose one that wraps around your upper arm. Only one finger should fit between your arm and the cuff. Do not choose one that measures your blood pressure from your wrist or finger. Where to find more information American Heart Association: www.heart.org Contact a doctor if: Your blood pressure keeps being high. Your blood pressure is suddenly low. Get help right away if: Your first blood pressure number is higher than 180. Your  second blood pressure number is higher than 120. These symptoms may be an emergency. Do not wait to see if the symptoms will go away. Get help right away. Call 911. Summary Check your blood pressure at the same time every day. Avoid caffeine, alcohol, smoking, and exercise for 30 minutes before checking your blood pressure. Make sure you understand what your blood pressure numbers should be. This information is not intended to replace advice given to you by your health care provider. Make sure you discuss any questions you have with your health care provider. Document Revised: 11/08/2020 Document Reviewed: 11/08/2020 Elsevier Patient Education  2024 [employer].

## 2024-03-14 ENCOUNTER — Encounter: Payer: Self-pay | Admitting: Family Medicine

## 2024-03-14 DIAGNOSIS — H66003 Acute suppurative otitis media without spontaneous rupture of ear drum, bilateral: Secondary | ICD-10-CM

## 2024-03-14 MED ORDER — AMOXICILLIN-POT CLAVULANATE 875-125 MG PO TABS: 1.0000 | ORAL_TABLET | Freq: Two times a day (BID) | ORAL | 0 refills | Status: AC

## 2024-03-14 NOTE — Telephone Encounter (Signed)
 Refilled and pt notified. LS

## 2024-03-23 ENCOUNTER — Ambulatory Visit: Payer: Self-pay

## 2024-03-23 NOTE — Telephone Encounter (Signed)
 FYI Only or Action Required?: Action required by provider: request for appointment. Patient prefers appointment with Dr. Jolinda to discuss blood pressure management  Patient was last seen in primary care on 03/04/2024 by Jolinda Norene HERO, DO.  Called Nurse Triage reporting Appointment.  Symptoms began n/a.  Interventions attempted: Other: diary of blood pressures.  Symptoms are: unchanged.  Triage Disposition: follow up as discussed at last office visit (about two weeks)  Patient/caregiver understands and will follow disposition?:  Message from Miquel SAILOR sent at 03/23/2024  9:24 AM EST  Reason for Triage: PT BP has been high lately. 01/14 BP 158/89. Has been higher then this. Needs call abck [phone]. Prefers Friday for app

## 2024-03-23 NOTE — Telephone Encounter (Signed)
 Returned patient call to schedule appropriate appointment to discuss elevated blood pressure, patient only wanting to see Dr. Jolinda on Friday 1/16. I advised patient that there is no availability with Dr. Jolinda for Friday as her schedule is already full, offered to schedule appointment for 1/20. Patient stated that she was standing I Dr. Melba office when she advised patient that she would squeeze her in any day that works for the patient and that Dr. Jolinda is not going to be happy that she is having to wait until Tuesday. I advised patient that I could only schedule in available appointment times and that I would relay the message to the provider. Patient then stated that she would come after 5:00 on Friday the 16th to talk to the doctor, I again told the patient that she could not show up and demand to see the the doctor and that the first available appointment is 1/20. Patient agreed to schedule appointment.

## 2024-03-29 ENCOUNTER — Encounter: Payer: Self-pay | Admitting: Family Medicine

## 2024-03-29 ENCOUNTER — Ambulatory Visit (INDEPENDENT_AMBULATORY_CARE_PROVIDER_SITE_OTHER): Admitting: Family Medicine

## 2024-03-29 VITALS — BP 135/78 | HR 80 | Temp 98.1°F | Ht 65.0 in | Wt 170.4 lb

## 2024-03-29 DIAGNOSIS — E663 Overweight: Secondary | ICD-10-CM | POA: Diagnosis not present

## 2024-03-29 DIAGNOSIS — I10 Essential (primary) hypertension: Secondary | ICD-10-CM | POA: Diagnosis not present

## 2024-03-29 DIAGNOSIS — H6992 Unspecified Eustachian tube disorder, left ear: Secondary | ICD-10-CM

## 2024-03-29 MED ORDER — SEMAGLUTIDE-WEIGHT MANAGEMENT 1 MG/0.5ML ~~LOC~~ SOAJ: 1.0000 mg | SUBCUTANEOUS | 0 refills | Status: AC

## 2024-03-29 MED ORDER — SEMAGLUTIDE-WEIGHT MANAGEMENT 1.7 MG/0.75ML ~~LOC~~ SOAJ: 1.7000 mg | SUBCUTANEOUS | 0 refills | Status: AC

## 2024-03-29 MED ORDER — FLUTICASONE PROPIONATE 50 MCG/ACT NA SUSP: 2.0000 | Freq: Every day | NASAL | 6 refills | Status: AC

## 2024-03-29 MED ORDER — LOSARTAN POTASSIUM-HCTZ 50-12.5 MG PO TABS: 1.0000 | ORAL_TABLET | Freq: Every day | ORAL | 0 refills | Status: AC

## 2024-03-29 MED ORDER — SEMAGLUTIDE-WEIGHT MANAGEMENT 0.5 MG/0.5ML ~~LOC~~ SOAJ: 0.5000 mg | SUBCUTANEOUS | 0 refills | Status: AC

## 2024-03-29 MED ORDER — SEMAGLUTIDE-WEIGHT MANAGEMENT 0.25 MG/0.5ML ~~LOC~~ SOAJ: 0.2500 mg | SUBCUTANEOUS | 0 refills | Status: AC

## 2024-03-29 MED ORDER — SEMAGLUTIDE-WEIGHT MANAGEMENT 2.4 MG/0.75ML ~~LOC~~ SOAJ: 2.4000 mg | SUBCUTANEOUS | 0 refills | Status: AC

## 2024-03-29 NOTE — Progress Notes (Signed)
 "  Subjective: CC: Checkup PCP: Jolinda Jade HERO, DO YEP:[name] is a 64 y.o. female presenting to clinic today for:  Patient here for 1 month follow-up of her hypertension.  She reports that blood pressures have been fluctuant and range anywhere from systolics of 135 to systolics of 160 over diastolics of 80s to 90s.  They tend to fluctuate higher in the afternoon and lower in the morning.  She has been taking hydrochlorothiazide  25 mg daily but has not noticed a great deal of reduction in fluid retention.  She denies any cramping or other adverse side effects from the medicine  She is frustrated about her pound of weight gain despite low calorie intake.  Previously on a compounded GLP but interested in maybe revisiting something for weight loss.  Insurance does not cover.  She also reports persistent feeling of fullness in her ears, particularly when she lays from 1 side to the other she can actually feel them draining in her eustachian tube.  She is status post treatment with oral antibiotics x 2 rounds.  Not utilizing any nasal sprays but willing to do so    ROS: Per HPI  Allergies[1] Past Medical History:  Diagnosis Date   Abnormal Pap smear of cervix 2002   Dr. 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   Allergy 2012   seasonal   Anxiety    Arthritis    Benign hemangioma    Dr,  Joice    Breast cancer (HCC) 07/20/2014   Left Breast   Cervical stenosis (uterine cervix) 2002   Depression    Dizziness 1991   secondary to Meniere's disease   Hyperlipidemia 2007   Insomnia 1997   Meniere's disease 1991   Nervousness(799.21) 1987   Current Medications[2] Social History   Socioeconomic History   Marital status: Married    Spouse name: Cordella   Number of children: 2   Years of education: Not on file   Highest education level: Not on file  Occupational History   Not on file  Tobacco Use   Smoking status: Never   Smokeless tobacco: Never   Vaping Use   Vaping status: Never Used  Substance and Sexual Activity   Alcohol use: Not Currently    Comment: very rare   Drug use: No   Sexual activity: Yes    Birth control/protection: Surgical    Comment: ablation  Other Topics Concern   Not on file  Social History Narrative   Not on file   Social Drivers of Health   Tobacco Use: Low Risk (03/29/2024)   Patient History    Smoking Tobacco Use: Never    Smokeless Tobacco Use: Never    Passive Exposure: Not on file  Financial Resource Strain: Not on file  Food Insecurity: No Food Insecurity (09/04/2023)   Epic    Worried About Programme Researcher, Broadcasting/film/video in the Last Year: Never true    Ran Out of Food in the Last Year: Never true  Transportation Needs: Not on file  Physical Activity: Not on file  Stress: Not on file  Social Connections: Not on file  Intimate Partner Violence: Not At Risk (09/04/2023)   Epic    Fear of Current or Ex-Partner: No    Emotionally Abused: No    Physically Abused: No    Sexually Abused: No  Depression (PHQ2-9): Low Risk (09/04/2023)   Depression (PHQ2-9)    PHQ-2 Score: 0  Alcohol Screen: Not on file  Housing: Low Risk (09/04/2023)   Epic    Unable to Pay for Housing in the Last Year: No    Number of Times Moved in the Last Year: 0    Homeless in the Last Year: No  Utilities: Not on file  Health Literacy: Not on file   Family History  Problem Relation Age of Onset   Cirrhosis Father    Colon polyps Mother    Colon cancer Neg Hx    Esophageal cancer Neg Hx    Rectal cancer Neg Hx    Stomach cancer Neg Hx     Objective: Office vital signs reviewed. BP 135/78   Pulse 80   Temp 98.1 F (36.7 C)   Ht 5' 5 (1.651 m)   Wt 170 lb 6 oz (77.3 kg)   SpO2 96%   BMI 28.35 kg/m   Physical Examination:  General: Awake, alert, well nourished, No acute distress HEENT: Left TM with clear effusion appreciated. Cardio: regular rate and rhythm, S1S2 heard, no murmurs appreciated Pulm: clear to  auscultation bilaterally, no wheezes, rhonchi or rales; normal work of breathing on room air    Assessment/ Plan: 64 y.o. female   Essential hypertension - Plan: losartan -hydrochlorothiazide  (HYZAAR) 50-12.5 MG tablet, semaglutide -weight management (WEGOVY ) 0.25 MG/0.5ML SOAJ SQ injection, semaglutide -weight management (WEGOVY ) 0.5 MG/0.5ML SOAJ SQ injection, semaglutide -weight management (WEGOVY ) 1 MG/0.5ML SOAJ SQ injection, semaglutide -weight management (WEGOVY ) 1.7 MG/0.75ML SOAJ SQ injection, semaglutide -weight management (WEGOVY ) 2.4 MG/0.75ML SOAJ SQ injection  Eustachian tube dysfunction, left - Plan: fluticasone  (FLONASE ) 50 MCG/ACT nasal spray  Overweight (BMI 25.0-29.9) - Plan: semaglutide -weight management (WEGOVY ) 0.25 MG/0.5ML SOAJ SQ injection, semaglutide -weight management (WEGOVY ) 0.5 MG/0.5ML SOAJ SQ injection, semaglutide -weight management (WEGOVY ) 1 MG/0.5ML SOAJ SQ injection, semaglutide -weight management (WEGOVY ) 1.7 MG/0.75ML SOAJ SQ injection, semaglutide -weight management (WEGOVY ) 2.4 MG/0.75ML SOAJ SQ injection   Blood pressure not at goal typically some going to add losartan  to her regimen.  Continue diuretic but we will combine it with this pill to reduce pill burden.  I have also added semaglutide  for weight loss.  Hopefully this will help with her fluid balance as well.  No apparent contraindications to utilization of medication.    Flonase  sent for eustachian tube dysfunction.  Okay to continue OMT maneuver to help drain mechanically.  Will refer to ENT if symptoms are persistent  May follow-up in 3 months, sooner if concerns arise    Jade CHRISTELLA Fielding, DO [facility] [phone]     [1]  Allergies Allergen Reactions   Statins Other (See Comments)    Muscle pain   Lipitor [Atorvastatin] Other (See Comments)    myalgias   Temazepam Other (See Comments)    Pt does not remember (Restoril)  [2]  Current Outpatient Medications:     acetaminophen  (TYLENOL ) 500 MG tablet, Take 1,500 mg by mouth daily as needed for moderate pain., Disp: , Rfl:    albuterol  (PROVENTIL ) (2.5 MG/3ML) 0.083% nebulizer solution, Take 3 mLs (2.5 mg total) by nebulization every 6 (six) hours as needed for wheezing or shortness of breath., Disp: 150 mL, Rfl: 1   albuterol  (VENTOLIN  HFA) 108 (90 Base) MCG/ACT inhaler, Inhale 1-2 puffs into the lungs every 6 (six) hours as needed for wheezing or shortness of breath., Disp: 8.5 each, Rfl: 6   alendronate  (FOSAMAX ) 70 MG tablet, TAKE 1 TABLET ONCE A WEEK. TAKE WITH A FULL GLASS OF WATER  ON AN EMPTY STOMACH., Disp: 12 tablet, Rfl: 3   anastrozole  (ARIMIDEX ) 1 MG  tablet, TAKE 1 TABLET BY MOUTH EVERY DAY, Disp: 90 tablet, Rfl: 2   Biotin 10000 MCG TABS, Take 10,000 mcg by mouth daily., Disp: , Rfl:    Calcium  Carb-Cholecalciferol (CALCIUM  600 + D PO), Take 1 tablet by mouth at bedtime., Disp: , Rfl:    clobetasol  cream (TEMOVATE ) 0.05 %, Apply 1 Application topically 2 (two) times daily as needed (irritation)., Disp: 30 g, Rfl: 0   clonazePAM  (KLONOPIN ) 0.5 MG tablet, 1 tablet 3 times daily as needed for meniere's attack, Disp: 90 tablet, Rfl: 5   donepezil  (ARICEPT ) 10 MG tablet, Take 1 tablet (10 mg total) by mouth at bedtime., Disp: 90 tablet, Rfl: 4   furosemide  (LASIX ) 20 MG tablet, TAKE 1 TABLET BY MOUTH EVERY DAY, Disp: 30 tablet, Rfl: 3   hydrochlorothiazide  (HYDRODIURIL ) 25 MG tablet, Take 1 tablet (25 mg total) by mouth daily. For fluid and BP, Disp: 90 tablet, Rfl: 3   hydrOXYzine  (ATARAX ) 25 MG tablet, Take 0.5-1 tablets (12.5-25 mg total) by mouth 3 (three) times daily as needed for itching., Disp: 30 tablet, Rfl: 0   meclizine  (ANTIVERT ) 25 MG tablet, Take 1 tablet (25 mg total) by mouth 3 (three) times daily as needed for dizziness., Disp: 30 tablet, Rfl: 1   PARoxetine  (PAXIL -CR) 25 MG 24 hr tablet, Take 1 tablet (25 mg total) by mouth daily., Disp: 90 tablet, Rfl: 3   potassium chloride   (KLOR-CON ) 10 MEQ tablet, Take 1 tablet (10 mEq total) by mouth daily as needed (when taking lasix )., Disp: 90 tablet, Rfl: 3   potassium chloride  SA (KLOR-CON  M) 20 MEQ tablet, Take 1 tablet (20 mEq total) by mouth daily., Disp: 30 tablet, Rfl: 2   rosuvastatin  (CRESTOR ) 10 MG tablet, Take 1 tablet (10 mg total) by mouth daily., Disp: 90 tablet, Rfl: 3   Tiotropium Bromide Monohydrate  (SPIRIVA  RESPIMAT) 1.25 MCG/ACT AERS, X2 SAMPLES GIVEN, Disp: 4 g, Rfl: 6   triamcinolone  cream (KENALOG ) 0.1 %, Apply 1 application topically 2 (two) times daily as needed (irritation)., Disp: , Rfl:    Turmeric 500 MG TABS, Take 1 tablet by mouth 2 (two) times daily., Disp: , Rfl:    vitamin B-12 (CYANOCOBALAMIN) 1000 MCG tablet, Take 1,000 mcg by mouth at bedtime., Disp: , Rfl:    Vitamin D , Ergocalciferol , (DRISDOL ) 1.25 MG (50000 UNIT) CAPS capsule, TAKE 1 CAPSULE (50,000 UNITS TOTAL) BY MOUTH EVERY 7 (SEVEN) DAYS., Disp: 12 capsule, Rfl: 3   amoxicillin -clavulanate (AUGMENTIN ) 875-125 MG tablet, Take 1 tablet by mouth 2 (two) times daily. (Patient not taking: Reported on 03/29/2024), Disp: 20 tablet, Rfl: 0  "

## 2024-04-11 ENCOUNTER — Other Ambulatory Visit: Payer: Self-pay | Admitting: Hematology and Oncology

## 2024-04-11 MED ORDER — AZITHROMYCIN 250 MG PO TABS: ORAL_TABLET | ORAL | 0 refills | Status: AC

## 2024-04-11 NOTE — Progress Notes (Signed)
 URI: sending prescription for Z-Pak

## 2024-04-15 ENCOUNTER — Other Ambulatory Visit: Payer: Self-pay | Admitting: *Deleted

## 2024-04-15 MED ORDER — LEVOFLOXACIN 750 MG PO TABS: 750.0000 mg | ORAL_TABLET | Freq: Every day | ORAL | 0 refills | Status: AC

## 2024-04-15 NOTE — Progress Notes (Signed)
 MD requesting pt be prescribed Levaquin  750 mg p.o tablet daily x 7 days.  Prescription sent to pharmacy on file.

## 2024-07-01 ENCOUNTER — Ambulatory Visit: Admitting: Family Medicine

## 2024-08-02 ENCOUNTER — Inpatient Hospital Stay: Admitting: Hematology and Oncology

## 2024-08-02 ENCOUNTER — Inpatient Hospital Stay

## 2024-08-23 ENCOUNTER — Ambulatory Visit
